# Patient Record
Sex: Female | Born: 1950 | Race: White | Hispanic: No | Marital: Married | Smoking: Current every day smoker
Health system: Southern US, Community
[De-identification: ages and names within clinical notes are randomized; demographics above are authoritative.]

## PROBLEM LIST (undated history)

## (undated) DIAGNOSIS — F329 Major depressive disorder, single episode, unspecified: Secondary | ICD-10-CM

## (undated) DIAGNOSIS — F32A Depression, unspecified: Secondary | ICD-10-CM

## (undated) DIAGNOSIS — K922 Gastrointestinal hemorrhage, unspecified: Secondary | ICD-10-CM

## (undated) DIAGNOSIS — F419 Anxiety disorder, unspecified: Secondary | ICD-10-CM

## (undated) DIAGNOSIS — K5732 Diverticulitis of large intestine without perforation or abscess without bleeding: Secondary | ICD-10-CM

## (undated) DIAGNOSIS — K7460 Unspecified cirrhosis of liver: Secondary | ICD-10-CM

## (undated) DIAGNOSIS — R0602 Shortness of breath: Secondary | ICD-10-CM

## (undated) DIAGNOSIS — G9340 Encephalopathy, unspecified: Secondary | ICD-10-CM

## (undated) DIAGNOSIS — E039 Hypothyroidism, unspecified: Secondary | ICD-10-CM

## (undated) DIAGNOSIS — Z720 Tobacco use: Secondary | ICD-10-CM

## (undated) DIAGNOSIS — E669 Obesity, unspecified: Secondary | ICD-10-CM

## (undated) DIAGNOSIS — D369 Benign neoplasm, unspecified site: Secondary | ICD-10-CM

## (undated) DIAGNOSIS — I2510 Atherosclerotic heart disease of native coronary artery without angina pectoris: Secondary | ICD-10-CM

## (undated) DIAGNOSIS — E119 Type 2 diabetes mellitus without complications: Secondary | ICD-10-CM

## (undated) DIAGNOSIS — E785 Hyperlipidemia, unspecified: Secondary | ICD-10-CM

## (undated) DIAGNOSIS — E079 Disorder of thyroid, unspecified: Secondary | ICD-10-CM

## (undated) DIAGNOSIS — I499 Cardiac arrhythmia, unspecified: Secondary | ICD-10-CM

## (undated) DIAGNOSIS — I350 Nonrheumatic aortic (valve) stenosis: Secondary | ICD-10-CM

## (undated) DIAGNOSIS — K760 Fatty (change of) liver, not elsewhere classified: Secondary | ICD-10-CM

## (undated) DIAGNOSIS — I8500 Esophageal varices without bleeding: Secondary | ICD-10-CM

## (undated) DIAGNOSIS — K3189 Other diseases of stomach and duodenum: Secondary | ICD-10-CM

## (undated) DIAGNOSIS — D696 Thrombocytopenia, unspecified: Secondary | ICD-10-CM

## (undated) DIAGNOSIS — E559 Vitamin D deficiency, unspecified: Secondary | ICD-10-CM

## (undated) DIAGNOSIS — Z952 Presence of prosthetic heart valve: Secondary | ICD-10-CM

## (undated) DIAGNOSIS — I10 Essential (primary) hypertension: Secondary | ICD-10-CM

## (undated) DIAGNOSIS — K589 Irritable bowel syndrome without diarrhea: Secondary | ICD-10-CM

## (undated) DIAGNOSIS — F0390 Unspecified dementia without behavioral disturbance: Secondary | ICD-10-CM

## (undated) DIAGNOSIS — R911 Solitary pulmonary nodule: Secondary | ICD-10-CM

## (undated) DIAGNOSIS — R161 Splenomegaly, not elsewhere classified: Secondary | ICD-10-CM

## (undated) DIAGNOSIS — E538 Deficiency of other specified B group vitamins: Secondary | ICD-10-CM

## (undated) DIAGNOSIS — K7581 Nonalcoholic steatohepatitis (NASH): Secondary | ICD-10-CM

## (undated) DIAGNOSIS — K766 Portal hypertension: Secondary | ICD-10-CM

## (undated) HISTORY — DX: Vitamin D deficiency, unspecified: E55.9

## (undated) HISTORY — DX: Nonrheumatic aortic (valve) stenosis: I35.0

## (undated) HISTORY — DX: Gastrointestinal hemorrhage, unspecified: K92.2

## (undated) HISTORY — DX: Atherosclerotic heart disease of native coronary artery without angina pectoris: I25.10

## (undated) HISTORY — DX: Esophageal varices without bleeding: I85.00

## (undated) HISTORY — PX: OTHER SURGICAL HISTORY: SHX169

## (undated) HISTORY — PX: CORONARY ANGIOPLASTY: SHX604

## (undated) HISTORY — DX: Hyperlipidemia, unspecified: E78.5

## (undated) HISTORY — DX: Deficiency of other specified B group vitamins: E53.8

## (undated) HISTORY — DX: Shortness of breath: R06.02

## (undated) HISTORY — DX: Other diseases of stomach and duodenum: K31.89

## (undated) HISTORY — DX: Thrombocytopenia, unspecified: D69.6

## (undated) HISTORY — DX: Nonalcoholic steatohepatitis (NASH): K75.81

## (undated) HISTORY — PX: CARDIAC CATHETERIZATION: SHX172

## (undated) HISTORY — DX: Anxiety disorder, unspecified: F41.9

## (undated) HISTORY — DX: Disorder of thyroid, unspecified: E07.9

## (undated) HISTORY — DX: Type 2 diabetes mellitus without complications: E11.9

## (undated) HISTORY — DX: Benign neoplasm, unspecified site: D36.9

## (undated) HISTORY — PX: OVARIAN CYST REMOVAL: SHX89

## (undated) HISTORY — DX: Fatty (change of) liver, not elsewhere classified: K76.0

## (undated) HISTORY — DX: Unspecified dementia without behavioral disturbance: F03.90

## (undated) HISTORY — DX: Cardiac arrhythmia, unspecified: I49.9

## (undated) HISTORY — DX: Essential (primary) hypertension: I10

## (undated) HISTORY — DX: Solitary pulmonary nodule: R91.1

## (undated) HISTORY — DX: Obesity, unspecified: E66.9

## (undated) HISTORY — DX: Unspecified cirrhosis of liver: K74.60

## (undated) HISTORY — DX: Diverticulitis of large intestine without perforation or abscess without bleeding: K57.32

## (undated) HISTORY — DX: Portal hypertension: K76.6

## (undated) HISTORY — DX: Splenomegaly, not elsewhere classified: R16.1

## (undated) HISTORY — DX: Encephalopathy, unspecified: G93.40

## (undated) HISTORY — DX: Irritable bowel syndrome without diarrhea: K58.9

---
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
## 1998-02-02 ENCOUNTER — Ambulatory Visit (HOSPITAL_COMMUNITY): Admission: RE | Admit: 1998-02-02 | Discharge: 1998-02-02 | Payer: Self-pay | Admitting: Obstetrics and Gynecology

## 1998-08-20 ENCOUNTER — Emergency Department (HOSPITAL_COMMUNITY): Admission: EM | Admit: 1998-08-20 | Discharge: 1998-08-21 | Payer: Self-pay | Admitting: Endocrinology

## 1999-05-14 ENCOUNTER — Encounter: Admission: RE | Admit: 1999-05-14 | Discharge: 1999-05-14 | Payer: Self-pay | Admitting: Obstetrics and Gynecology

## 2006-11-10 ENCOUNTER — Encounter: Payer: Self-pay | Admitting: Gastroenterology

## 2006-11-13 ENCOUNTER — Encounter: Payer: Self-pay | Admitting: Gastroenterology

## 2008-04-06 ENCOUNTER — Ambulatory Visit: Payer: Self-pay | Admitting: Gastroenterology

## 2008-04-06 DIAGNOSIS — K589 Irritable bowel syndrome without diarrhea: Secondary | ICD-10-CM

## 2008-04-06 HISTORY — DX: Irritable bowel syndrome, unspecified: K58.9

## 2008-04-08 ENCOUNTER — Encounter: Payer: Self-pay | Admitting: Gastroenterology

## 2008-04-08 LAB — CONVERTED CEMR LAB
Calcium: 9.1 mg/dL (ref 8.4–10.5)
Chloride: 111 meq/L (ref 96–112)
Creatinine, Ser: 0.7 mg/dL (ref 0.4–1.2)
Eosinophils Absolute: 0.3 10*3/uL (ref 0.0–0.7)
Eosinophils Relative: 2.5 % (ref 0.0–5.0)
GFR calc non Af Amer: 91 mL/min
Glucose, Bld: 185 mg/dL — ABNORMAL HIGH (ref 70–99)
HCT: 40 % (ref 36.0–46.0)
Hemoglobin: 14.2 g/dL (ref 12.0–15.0)
MCV: 85.4 fL (ref 78.0–100.0)
Monocytes Absolute: 1 10*3/uL (ref 0.1–1.0)
Platelets: 177 10*3/uL (ref 150–400)
RDW: 12.8 % (ref 11.5–14.6)
Sodium: 141 meq/L (ref 135–145)
WBC: 10.2 10*3/uL (ref 4.5–10.5)

## 2008-04-25 ENCOUNTER — Telehealth: Payer: Self-pay | Admitting: Gastroenterology

## 2008-04-28 ENCOUNTER — Ambulatory Visit: Payer: Self-pay | Admitting: Gastroenterology

## 2008-04-28 ENCOUNTER — Ambulatory Visit (HOSPITAL_COMMUNITY): Admission: RE | Admit: 2008-04-28 | Discharge: 2008-04-28 | Payer: Self-pay | Admitting: Gastroenterology

## 2008-04-28 ENCOUNTER — Encounter: Payer: Self-pay | Admitting: Gastroenterology

## 2008-04-30 ENCOUNTER — Encounter: Payer: Self-pay | Admitting: Gastroenterology

## 2008-05-05 ENCOUNTER — Telehealth: Payer: Self-pay | Admitting: Gastroenterology

## 2008-06-14 ENCOUNTER — Telehealth: Payer: Self-pay | Admitting: Gastroenterology

## 2008-06-14 ENCOUNTER — Encounter: Payer: Self-pay | Admitting: Gastroenterology

## 2008-06-14 DIAGNOSIS — K5732 Diverticulitis of large intestine without perforation or abscess without bleeding: Secondary | ICD-10-CM

## 2008-06-14 HISTORY — DX: Diverticulitis of large intestine without perforation or abscess without bleeding: K57.32

## 2008-06-16 ENCOUNTER — Ambulatory Visit: Payer: Self-pay | Admitting: Cardiology

## 2008-06-23 ENCOUNTER — Ambulatory Visit: Payer: Self-pay | Admitting: Gynecology

## 2008-06-27 ENCOUNTER — Ambulatory Visit: Payer: Self-pay | Admitting: Gynecology

## 2008-07-19 ENCOUNTER — Ambulatory Visit: Payer: Self-pay | Admitting: Gynecology

## 2008-07-22 ENCOUNTER — Encounter: Payer: Self-pay | Admitting: Gynecology

## 2008-07-22 ENCOUNTER — Ambulatory Visit: Payer: Self-pay | Admitting: Gynecology

## 2008-07-22 ENCOUNTER — Ambulatory Visit (HOSPITAL_COMMUNITY): Admission: RE | Admit: 2008-07-22 | Discharge: 2008-07-22 | Payer: Self-pay | Admitting: Gynecology

## 2008-08-01 ENCOUNTER — Ambulatory Visit: Payer: Self-pay | Admitting: Gynecology

## 2009-01-31 ENCOUNTER — Telehealth: Payer: Self-pay | Admitting: Gastroenterology

## 2009-02-03 ENCOUNTER — Ambulatory Visit: Payer: Self-pay | Admitting: Gynecology

## 2009-02-03 ENCOUNTER — Encounter: Payer: Self-pay | Admitting: Gastroenterology

## 2009-02-20 ENCOUNTER — Telehealth (INDEPENDENT_AMBULATORY_CARE_PROVIDER_SITE_OTHER): Payer: Self-pay | Admitting: *Deleted

## 2009-02-28 ENCOUNTER — Ambulatory Visit: Payer: Self-pay | Admitting: Gastroenterology

## 2009-02-28 DIAGNOSIS — R740 Nonspecific elevation of levels of transaminase and lactic acid dehydrogenase [LDH]: Secondary | ICD-10-CM

## 2009-02-28 LAB — CONVERTED CEMR LAB
A-1 Antitrypsin, Ser: 195 mg/dL (ref 83–200)
Ceruloplasmin: 43 mg/dL (ref 21–63)
HCV Ab: NEGATIVE
Tissue Transglutaminase Ab, IgA: 0.5 units (ref <7)

## 2009-03-01 LAB — CONVERTED CEMR LAB
ALT: 70 units/L — ABNORMAL HIGH (ref 0–35)
AST: 56 units/L — ABNORMAL HIGH (ref 0–37)
Albumin: 3.8 g/dL (ref 3.5–5.2)
Calcium: 9.2 mg/dL (ref 8.4–10.5)
Creatinine, Ser: 0.7 mg/dL (ref 0.4–1.2)
Glucose, Bld: 284 mg/dL — ABNORMAL HIGH (ref 70–99)
HCT: 45.9 % (ref 36.0–46.0)
Iron: 67 ug/dL (ref 42–145)
Lymphs Abs: 4.9 10*3/uL — ABNORMAL HIGH (ref 0.7–4.0)
MCHC: 34.3 g/dL (ref 30.0–36.0)
MCV: 86 fL (ref 78.0–100.0)
Monocytes Relative: 6.4 % (ref 3.0–12.0)
Neutrophils Relative %: 53.3 % (ref 43.0–77.0)
RDW: 13.1 % (ref 11.5–14.6)
Sodium: 138 meq/L (ref 135–145)
Total Bilirubin: 0.7 mg/dL (ref 0.3–1.2)
Total Protein: 7.4 g/dL (ref 6.0–8.3)
Transferrin: 231 mg/dL (ref 212.0–360.0)
WBC: 13.2 10*3/uL — ABNORMAL HIGH (ref 4.5–10.5)

## 2009-03-03 ENCOUNTER — Ambulatory Visit (HOSPITAL_COMMUNITY): Admission: RE | Admit: 2009-03-03 | Discharge: 2009-03-03 | Payer: Self-pay | Admitting: Gastroenterology

## 2009-05-05 ENCOUNTER — Ambulatory Visit: Payer: Self-pay | Admitting: Gastroenterology

## 2009-11-03 ENCOUNTER — Telehealth (INDEPENDENT_AMBULATORY_CARE_PROVIDER_SITE_OTHER): Payer: Self-pay | Admitting: *Deleted

## 2010-09-04 NOTE — Progress Notes (Signed)
Summary: Lab reminder  Phone Note Outgoing Call Call back at Home Phone [phone]   Call placed by: Christian Mate CMA [name]),  November 03, 2009 1:29 PM Summary of Call: pt reminded to have labs Initial call taken by: Christian Mate CMA [name]),  November 03, 2009 1:30 PM

## 2010-12-18 NOTE — H&P (Signed)
NAME:  Kelly [name], Kelly [name]                 ACCOUNT NO.:  [id number]   MEDICAL RECORD NO.:  [id number]          PATIENT TYPE:  AMB   LOCATION:  [place]                           FACILITY:  [facility]   PHYSICIAN:  Timothy P. Fontaine, M.D.DATE OF BIRTH:  [birth year]   DATE OF ADMISSION:  DATE OF DISCHARGE:                              HISTORY & PHYSICAL   CHIEF COMPLAINT:  Pelvic discomfort, ovarian or tubular cyst on the  right.   HISTORY OF PRESENT ILLNESS:  A 60 year old G75, P62 female, postmenopausal  presented complaining of some pelvic pain.  Ultimately, underwent  evaluation to include CTs and ultrasounds, which showed a thin-walled  avascular cyst in the right adnexa measuring 70 x 60 mm somewhat tubular  in shape.  This has persisted over 2 months.  Her pain has also  persisted and she is admitted for laparoscopic evaluation and removal.  She did have a CA-125, which was negative.  No evidence of ascites or  cul-de-sac fluid and no evidence of adenopathy on CT scanning.   PAST MEDICAL HISTORY:  Significant for borderline diabetics, IBS,  diverticulitis, anxiety disorder, hypothyroidism, and hypertension.   PAST SURGICAL HISTORY:  Laparoscopic ovarian cystectomy in 1999.   CURRENT MEDICATIONS:  1. Armour Thyroid.  2. Wellbutrin.  3. Metaprel.  4. Diovan.  5. Ambien p.r.n.  See medical reconciliation sheet for dosages.   MEDICAL ALLERGIES:  None.   REVIEW OF SYSTEMS:  Noncontributory.   FAMILY HISTORY:  Noncontributory.   SOCIAL HISTORY:  Noncontributory.   ADMISSION PHYSICAL EXAMINATION:  GENERAL:  Afebrile.  VITAL SIGNS:  Stable.  HEENT:  Normal.  LUNGS:  Clear.  CARDIAC:  Regular rate.  No rubs, murmurs, or gallops.  ABDOMINAL:  Benign.  PELVIC:  External BUS, vagina normal.  Cervix grossly normal, atrophic.  Bimanual without gross masses or tenderness.   ASSESSMENT:  A 60 year old G1, P82 female, postmenopausal, lower  abdominal discomfort, ultimately underwent evaluation  showing a tubular  avascular simple appearing cyst in the right adnexa, suspicious for  hydrosalpinx, possible ovarian, negative CA-125, no ascites cul-de-sac  fluids, no adenopathy on CT scan, for laparoscopic evaluation and  removal.  I reviewed the surgery with the patient, inspected  intraoperative and postoperative courses.  She understands clearly that  I cannot guarantee this is not malignancy and indeed, it [place] be the  ovarian or non-ovarian malignancy, if so discovered, then at this point  we would terminate the procedure.  She would be referred to a  gynecologic oncologist.  She also understands realistic risks of  rupturing of this structure upon removal and the issue of rupturing if  indeed it turns out to be a malignancy, was discussed whether this would  change her prognosis or not.  She also understands that there is  significant adhesive disease obscuring this adnexa that would make it  difficult to free up this adnexa for fear of damage to surrounding  structures and we Kruzel abort the laparoscopic approach and then return at  a future date for more definitive exploratory surgery and she  understands and accepts this.  She will have a bowel prep and antibiotic  prophylaxis prior to the surgery.  Which involved with laparoscopic  surgery to include multiple port sites, insufflation, trocar placement,  sharp blunt dissection, electrocautery, harmonic scalpel, all reviewed  with her.  She understands that if it is tubular, we Chisum remove her  tube; if ovarian, her ovary and tube and I will have to make this  decision at the time of surgery.  The acute intraoperative risks were  discussed to include infection requiring prolonged antibiotics, abscess  formation requiring reoperation, and abscess drainage, incisional  complications requiring opening and draining of incisions, closure by  secondary intention, long-term issues to include hernia formation, scar  formation, keloid,  and cosmetics.  Risk of hemorrhage necessitating  transfusion, discussed to include transfusion, reaction, hepatitis, HIV,  mad cow disease and other unknown entities.  Risk of inadvertent injury  to internal organs including bowel, bladder, ureters, vessels, and  nerves necessitating major exploratory reparative surgeries, future  reparative surgeries, ostomy formation, bowel resection, bladder repair,  ureteral damage, repair was all discussed, understood, and accepted.  The patient's questions were answered to her satisfaction and she is  ready to proceed with surgery.      Timothy P. Fontaine, M.D.  Electronically Signed     TPF/MEDQ  D:  07/19/2008  T:  07/20/2008  Job:  [id number]

## 2010-12-18 NOTE — Op Note (Signed)
NAME:  Kelly [name], Kelly [name]                 ACCOUNT NO.:  [id number]   MEDICAL RECORD NO.:  [id number]          PATIENT TYPE:  AMB   LOCATION:  [place]                           FACILITY:  [facility]   PHYSICIAN:  Timothy P. Fontaine, M.D.DATE OF BIRTH:  1950/12/10   DATE OF PROCEDURE:  07/22/2008  DATE OF DISCHARGE:                               OPERATIVE REPORT   PREOPERATIVE DIAGNOSES:  Pelvic discomfort and cystic right adnexal  mass.   POSTOPERATIVE DIAGNOSIS:  Torsed right hydrosalpinx.   PROCEDURE:  Laparoscopic right salpingectomy.   SURGEON:  Timothy P. Fontaine, MD   ASSISTANT:  Starlyn Skeans. Toney Rakes, MD   ANESTHETIC:  General with 0.25% Marcaine incisional injection.   ESTIMATED BLOOD LOSS:  Minimal.   COMPLICATIONS:  None.   PATHOLOGY:  1. Opening cell washing.  2. Hydro fluid.  3. Distal right hydrosalpinx.   FINDINGS:  EUA, external BUS, vagina grossly normal, cervix grossly  normal.  Bimanual, uterus grossly normal although limited by abdominal  girth.  Adnexa without gross masses.  Laparoscopic anterior cul-de-sac  normal.  Posterior cul-de-sac normal.  Uterus normal in size, shape, and  contour.  Left ovary grossly normal.  Left fallopian tube not well  visualized, but grossly normal on the distal inspection.  Right ovary  grossly normal.  Right fallopian tube with grossly dilated hydrosalpinx  at the terminal portion and a cystic mass with gross evidence of torsion  with multiple twists of the fallopian tube.  No evidence of pelvic  adhesive disease or endometriosis.  Upper abdominal exam shows appendix  grossly normal, free and mobile.  Liver grossly normal.  Gallbladder  normal to limited inspection.   PROCEDURE IN DETAIL:  The patient was taken to the operating room,  underwent general anesthesia, placed in the low dorsal lithotomy  position, received an abdominal perineal vaginal preparation with  Betadine solution.  EUA performed.  Hulka tenaculum was placed on the  cervix.  Bladder emptied with an indwelling Foley catheterization.  The  patient was draped in a usual fashion.  A repeat transverse  infraumbilical incision was made using the 10-mm Optiview trocar.  The  abdomen was sharply entered under direct visualization without  difficulty and subsequently insufflated.  Right and left 5-mm suprapubic  ports were then placed under direct visualization after  transillumination for the vessels without difficulty.  Examination of  pelvic organs and upper abdominal exam was carried out with findings  noted above.  The right distal hydro was grasped.  The torsed portion  exposed and using the gyrus bipolar cautery, this section was bipolar  cauterized and transected to free the distal end of the hydro.  Switching to a 5-mm laparoscope through a suprapubic port, the Endopouch  was placed through the infraumbilical incision and the cystic mass was  placed within the bag.  Of note, an opening cell washing was taken prior  to initiating removal of the hydro.  At this point, due to the size of  the lesion, a 5-mm laparoscopic needle aspirator was placed through the  5-mm port.  The cystic mass punctured  and the mass decompressed of  approximately 30-40 mL of clear fluid all within the Endopouch, never  spilling or leaking into the peritoneal cavity.  This was removed and  sent for cytology and subsequently this allowed for the Endopouch to be  removed from the infraumbilical port site without difficulty.  The  specimen was sent to Pathology.  At this point, the abdomen was re-  insufflated.  Reexamination showed hemostasis from the operative site.  The right and left 5-mm ports were removed.  The gas allowed to escape.  Hemostasis maintained under a low-pressure situation and the  infraumbilical port was backed out under direct visualization showing  adequate hemostasis.  No evidence of hernia formation.  The fascial  edges at the infraumbilical port site were  grasped with Kocher clamps,  elevated, and a using 0 Vicryl suture, the fascia was reapproximated in  a running stitch.  The subcutaneous tissues were reapproximated with  interrupted 0 Vicryl stitch and subsequently all skin incision sites  were injected using 0.25% Marcaine.  The skin was then closed using 3-0  chromic in interrupted cutaneous stitch.  The Hulka tenaculum was  removed.  Sterile dressings were applied to the surgical sites.  The  patient was placed in the supine position, awakened without difficulty,  and taken to recovery room in good condition having tolerated the  procedure well.      Timothy P. Fontaine, M.D.  Electronically Signed     TPF/MEDQ  D:  07/22/2008  T:  07/23/2008  Job:  [id number]

## 2011-05-10 LAB — CBC
HCT: 39.7 % (ref 36.0–46.0)
Hemoglobin: 13.7 g/dL (ref 12.0–15.0)
MCV: 86.8 fL (ref 78.0–100.0)
RDW: 14.4 % (ref 11.5–15.5)
WBC: 11.1 10*3/uL — ABNORMAL HIGH (ref 4.0–10.5)

## 2011-05-10 LAB — COMPREHENSIVE METABOLIC PANEL
Alkaline Phosphatase: 87 U/L (ref 39–117)
BUN: 7 mg/dL (ref 6–23)
Chloride: 105 mEq/L (ref 96–112)
GFR calc non Af Amer: >60 mL/min (ref >60)
Glucose, Bld: 107 mg/dL — ABNORMAL HIGH (ref 70–99)
Potassium: 3.8 mEq/L (ref 3.5–5.1)
Total Bilirubin: 0.4 mg/dL (ref 0.3–1.2)

## 2012-10-21 ENCOUNTER — Telehealth: Payer: Self-pay | Admitting: Gastroenterology

## 2012-10-21 NOTE — Telephone Encounter (Signed)
Line rings busy

## 2012-10-22 NOTE — Telephone Encounter (Signed)
Small amount of BRB on tissue, occasional bloating and lower abd discomfort.  Pt was offered several appt's for the first week of April but she declined due to her husbands appt's she decided on 11/24/12.

## 2012-11-03 ENCOUNTER — Other Ambulatory Visit (INDEPENDENT_AMBULATORY_CARE_PROVIDER_SITE_OTHER): Payer: BC Managed Care – PPO

## 2012-11-03 ENCOUNTER — Encounter: Payer: Self-pay | Admitting: Gastroenterology

## 2012-11-03 ENCOUNTER — Ambulatory Visit (INDEPENDENT_AMBULATORY_CARE_PROVIDER_SITE_OTHER): Payer: BC Managed Care – PPO | Admitting: Gastroenterology

## 2012-11-03 VITALS — BP 120/60 | HR 68 | Ht 64.0 in | Wt 216.0 lb

## 2012-11-03 DIAGNOSIS — R141 Gas pain: Secondary | ICD-10-CM

## 2012-11-03 DIAGNOSIS — R140 Abdominal distension (gaseous): Secondary | ICD-10-CM

## 2012-11-03 DIAGNOSIS — R142 Eructation: Secondary | ICD-10-CM

## 2012-11-03 DIAGNOSIS — K625 Hemorrhage of anus and rectum: Secondary | ICD-10-CM

## 2012-11-03 DIAGNOSIS — R109 Unspecified abdominal pain: Secondary | ICD-10-CM

## 2012-11-03 DIAGNOSIS — R197 Diarrhea, unspecified: Secondary | ICD-10-CM

## 2012-11-03 LAB — COMPREHENSIVE METABOLIC PANEL
ALT: 24 U/L (ref 0–35)
CO2: 24 mEq/L (ref 19–32)
Calcium: 9.5 mg/dL (ref 8.4–10.5)
Chloride: 105 mEq/L (ref 96–112)
GFR: 79.01 mL/min (ref >60.00)
Sodium: 139 mEq/L (ref 135–145)
Total Protein: 7.6 g/dL (ref 6.0–8.3)

## 2012-11-03 MED ORDER — MOVIPREP 100 G PO SOLR: 1.0000 | Freq: Once | ORAL | Status: DC

## 2012-11-03 NOTE — Progress Notes (Signed)
Review of gastrointestinal problems:  1. small tubular adenoma in colon, removed September, 2009. Next colonoscopy September 2014  2. left-sided diverticulosis with diverticular associated edema noted by colonoscopy 2009  3. abnormal liver tests noted July, 2010: Likely from NASH Blood work August, 2010: Hepatitis A., B., C. were all negative; alpha one antitrypsin level normal, ceruloplasmin level normal, AMA negative, AMA negative, iron studies normal, anti-smooth muscle antibody negative, AST and ALT in 50-70 range; TTG negative, IgA level normal. Liver ultrasound suggested fatty liver disease.   HPI: This is a   very pleasant 62 year old woman whom I last saw about 4 years ago.  Minor reddish blood on a single occasion 2 weeks ago.  Since January has had a lot of GI upset (her husband had AMI one week prior).  She has been "really really really really depressed."  Started zoloft  About a month ago and cut back.  She has been having lower abdominal pains. HAs had these pains in past, eventually was determined to be from ovarian cyst and that was eventually resected as well as the appy.  Currently her pains are in very low abdomen, worse on right. They were intermittent.  She cannot characterize the pain. Usually eating will bloating.  Having BM releives the pain for brief time.  Her normal BM regimine is usually twice per day.  She has had alternating constipation, diarrhea.    SHe just wants to lay down, resting.    Review of systems: Pertinent positive and negative review of systems were noted in the above HPI section. Complete review of systems was performed and was otherwise normal.    Past Medical History  Diagnosis Date  . Adenomatous polyp   . Diverticulosis   . NASH (nonalcoholic steatohepatitis)   . Hypertension   . Thyroid disease   . Diabetes   . Anxiety   . Arrhythmia     Past Surgical History  Procedure Laterality Date  . Oopherectomy      Current  Outpatient Prescriptions  Medication Sig Dispense Refill  . glyBURIDE (DIABETA) 2.5 MG tablet Take 2.5 mg by mouth 3 (three) times daily. 2 every morning and 1 every night      . LORazepam (ATIVAN) 0.5 MG tablet Take 0.5 mg by mouth every 8 (eight) hours.      Marland Kitchen losartan (COZAAR) 100 MG tablet Take 100 mg by mouth daily.      . metFORMIN (GLUMETZA) 500 MG (MOD) 24 hr tablet Take 500 mg by mouth 3 (three) times daily. 2 every morning and 1 every night      . metoprolol succinate (TOPROL-XL) 25 MG 24 hr tablet Take 25 mg by mouth 2 (two) times daily.      . sertraline (ZOLOFT) 50 MG tablet Take 50 mg by mouth daily.      Marland Kitchen zolpidem (AMBIEN) 5 MG tablet Take 5 mg by mouth at bedtime as needed for sleep.       No current facility-administered medications for this visit.    Allergies as of 11/03/2012 - Review Complete 11/03/2012  Allergen Reaction Noted  . Celecoxib      Family History  Problem Relation Age of Onset  . Lung cancer Father   . Heart disease Father   . Alzheimer's disease Mother     History   Social History  . Marital Status: Married    Spouse Name: N/A    Number of Children: N/A  . Years of Education: N/A   Occupational  History  . Not on file.   Social History Main Topics  . Smoking status: Current Every Day Smoker  . Smokeless tobacco: Not on file  . Alcohol Use: No  . Drug Use: No  . Sexually Active: Not on file   Other Topics Concern  . Not on file   Social History Narrative  . No narrative on file       Physical Exam: BP 120/60  Pulse 68  Ht 5' 4"  (1.626 m)  Wt 216 lb (97.977 kg)  BMI 37.06 kg/m2 Constitutional: generally well-appearing Psychiatric: alert and oriented x3 Eyes: extraocular movements intact Mouth: oral pharynx moist, no lesions Neck: supple no lymphadenopathy Cardiovascular: heart regular rate and rhythm Lungs: clear to auscultation bilaterally Abdomen: soft, nontender, nondistended, no obvious ascites, no peritoneal  signs, normal bowel sounds Extremities: no lower extremity edema bilaterally Skin: no lesions on visible extremities    Assessment and plan: 62 y.o. female with  changes in bowels, personal history of adenomatous polyp  She admits to being very depressed recently since her husband's illness and I suspected that could be causing some of her GI changes. She has however seen some blood per rectum and has a personal history of adenomatous polyps. Her last colonoscopy was about 4 1/2 years ago and she is due for surveillance colonoscopy in the next few months and we will schedule that for her now. She will also get complete metabolic profile since she has had abnormal liver tests in the past.

## 2012-11-03 NOTE — Patient Instructions (Addendum)
You will have labs checked today in the basement lab.  Please head down after you check out with the front desk (cmet) You will be set up for a colonoscopy (moderate sedation, LEC) for change in bowel, minor bleeding. One of your biggest health concerns is your smoking.  This increases your risk for most cancers and serious cardiovascular diseases such as strokes, heart attacks.  You should try your best to stop.  If you need assistance, please contact your PCP or Smoking Cessation Class at [facility] [phone]) or [place] (1-800-QUIT-NOW). Try OTC imodium for your intermittent diarrhea.                                               We are excited to introduce MyChart, a new best-in-class service that provides you online access to important information in your electronic medical record. We want to make it easier for you to view your health information - all in one secure location - when and where you need it. We expect MyChart will enhance the quality of care and service we provide.  When you register for MyChart, you can:    View your test results.    Request appointments and receive appointment reminders via email.    Request medication renewals.    View your medical history, allergies, medications and immunizations.    Communicate with your physician's office through a password-protected site.    Conveniently print information such as your medication lists.  To find out if MyChart is right for you, please talk to a member of our clinical staff today. We will gladly answer your questions about this free health and wellness tool.  If you are age 64 or older and want a member of your family to have access to your record, you must provide written consent by completing a proxy form available at our office. Please speak to our clinical staff about guidelines regarding accounts for patients younger than age 32.  As you activate your MyChart account and need any technical  assistance, please call the MyChart technical support line at [phone]) or email your question to [email or web address]. If you email your question(s), please include your name, a return phone number and the best time to reach you.  If you have non-urgent health-related questions, you can send a message to our office through [place] at [email or web address]. If you have a medical emergency, call 911.  Thank you for using MyChart as your new health and wellness resource!   MyChart licensed from [employer],  1999-2010. Patents Pending.

## 2012-11-24 ENCOUNTER — Ambulatory Visit: Payer: Self-pay | Admitting: Gastroenterology

## 2012-11-26 ENCOUNTER — Telehealth: Payer: Self-pay | Admitting: Gastroenterology

## 2012-11-26 NOTE — Telephone Encounter (Signed)
Pt advised to follow Dr [name] recommendations from her recent office visit and she will be put on a wait list for her procedure.  Pt agreed and will call with further questions or concerns

## 2012-12-09 ENCOUNTER — Ambulatory Visit (AMBULATORY_SURGERY_CENTER): Payer: BC Managed Care – PPO | Admitting: Gastroenterology

## 2012-12-09 ENCOUNTER — Encounter: Payer: Self-pay | Admitting: Gastroenterology

## 2012-12-09 ENCOUNTER — Other Ambulatory Visit: Payer: Self-pay | Admitting: Gastroenterology

## 2012-12-09 VITALS — BP 109/62 | HR 53 | Temp 97.4°F | Resp 20 | Ht 64.0 in | Wt 216.0 lb

## 2012-12-09 DIAGNOSIS — R140 Abdominal distension (gaseous): Secondary | ICD-10-CM

## 2012-12-09 DIAGNOSIS — R197 Diarrhea, unspecified: Secondary | ICD-10-CM

## 2012-12-09 DIAGNOSIS — R109 Unspecified abdominal pain: Secondary | ICD-10-CM

## 2012-12-09 DIAGNOSIS — K625 Hemorrhage of anus and rectum: Secondary | ICD-10-CM

## 2012-12-09 DIAGNOSIS — K5289 Other specified noninfective gastroenteritis and colitis: Secondary | ICD-10-CM

## 2012-12-09 MED ORDER — SODIUM CHLORIDE 0.9 % IV SOLN: 500.0000 mL | INTRAVENOUS | Status: DC

## 2012-12-09 NOTE — Op Note (Signed)
[facility]  [address] [place], [address]   COLONOSCOPY PROCEDURE REPORT  PATIENT: Kelly [name], Kelly [name]  MR#: [id number] BIRTHDATE: [birth year] , 62  yrs. old GENDER: Female ENDOSCOPIST: [name], MD PROCEDURE DATE:  12/09/2012 PROCEDURE:   Colonoscopy with biopsy ASA CLASS:   Class II INDICATIONS:single small TA removed Sept 2009, recent change in bowels (diarrhea) and intermittent minor rectal bleeding. MEDICATIONS: Fentanyl 50 mcg IV, Versed 6 mg IV, and These medications were titrated to patient response per physician's verbal order  DESCRIPTION OF PROCEDURE:   After the risks benefits and alternatives of the procedure were thoroughly explained, informed consent was obtained.  A digital rectal exam revealed no abnormalities of the rectum.   The LB PCF-H180AL [id number] and LB CF-H180AL [id number]  endoscope was introduced through the anus and advanced to the terminal ileum which was intubated for a short distance. No adverse events experienced.   The quality of the prep was good.  The instrument was then slowly withdrawn as the colon was fully examined.   COLON FINDINGS: The colonic mucosa was slightly erythematous throughout.  Biopsies were taken randomly and sent to pathology. There were multiple diverticulum in the left colon.  The terminal ileum was normal.  No polyps or cancers.  Retroflexed views revealed no abnormalities. The time to cecum=2 minutes 49 seconds. Withdrawal time=5 minutes 37 seconds.  The scope was withdrawn and the procedure completed. COMPLICATIONS: There were no complications.  ENDOSCOPIC IMPRESSION: The colonic mucosa was slightly erythematous throughout.  Biopsies were taken randomly and sent to pathology.  There were multiple diverticulum in the left colon.  The terminal ileum was normal.  No polyps or cancers.  RECOMMENDATIONS: 1.  Await final pathology. 2.  You should continue to follow colorectal cancer  screening guidelines for "routine risk" patients with a repeat colonoscopy in 10 years.  There is no need for FOBT (stool) testing for at least 5 years.   eSigned:  [name], MD 12/09/2012 11:30 AM   cc: Deland Pretty, MD

## 2012-12-09 NOTE — Progress Notes (Signed)
Patient did not experience any of the following events: a burn prior to discharge; a fall within the facility; wrong site/side/patient/procedure/implant event; or a hospital transfer or hospital admission upon discharge from the facility. (G8907) Patient did not have preoperative order for IV antibiotic SSI prophylaxis. (G8918)  

## 2012-12-09 NOTE — Patient Instructions (Addendum)
One of your biggest health concerns is your smoking.  This increases your risk for most cancers and serious cardiovascular diseases such as strokes, heart attacks.  You should try your best to stop.  If you need assistance, please contact your PCP or Smoking Cessation Class at [facility] [phone]) or [place] (1-800-QUIT-NOW).   YOU HAD AN ENDOSCOPIC PROCEDURE TODAY AT [facility]: Refer to the procedure report that was given to you for any specific questions about what was found during the examination.  If the procedure report does not answer your questions, please call your gastroenterologist to clarify.  If you requested that your care partner not be given the details of your procedure findings, then the procedure report has been included in a sealed envelope for you to review at your convenience later.  YOU SHOULD EXPECT: Some feelings of bloating in the abdomen. Passage of more gas than usual.  Walking can help get rid of the air that was put into your GI tract during the procedure and reduce the bloating. If you had a lower endoscopy (such as a colonoscopy or flexible sigmoidoscopy) you Mealey notice spotting of blood in your stool or on the toilet paper. If you underwent a bowel prep for your procedure, then you [place] not have a normal bowel movement for a few days.  DIET: Your first meal following the procedure should be a light meal and then it is ok to progress to your normal diet.  A half-sandwich or bowl of soup is an example of a good first meal.  Heavy or fried foods are harder to digest and [place] make you feel nauseous or bloated.  Likewise meals heavy in dairy and vegetables can cause extra gas to form and this can also increase the bloating.  Drink plenty of fluids but you should avoid alcoholic beverages for 24 hours.  ACTIVITY: Your care partner should take you home directly after the procedure.  You should plan to take it easy, moving slowly for the rest of  the day.  You can resume normal activity the day after the procedure however you should NOT DRIVE or use heavy machinery for 24 hours (because of the sedation medicines used during the test).    SYMPTOMS TO REPORT IMMEDIATELY: A gastroenterologist can be reached at any hour.  During normal business hours, 8:30 AM to 5:00 PM Monday through Friday, call [phone].  After hours and on weekends, please call the GI answering service at [phone] who will take a message and have the physician on call contact you.   Following lower endoscopy (colonoscopy or flexible sigmoidoscopy):  Excessive amounts of blood in the stool  Significant tenderness or worsening of abdominal pains  Swelling of the abdomen that is new, acute  Fever of 100F or higher  FOLLOW UP: If any biopsies were taken you will be contacted by phone or by letter within the next 1-3 weeks.  Call your gastroenterologist if you have not heard about the biopsies in 3 weeks.  Our staff will call the home number listed on your records the next business day following your procedure to check on you and address any questions or concerns that you [place] have at that time regarding the information given to you following your procedure. This is a courtesy call and so if there is no answer at the home number and we have not heard from you through the emergency physician on call, we will assume that you have returned  to your regular daily activities without incident.  SIGNATURES/CONFIDENTIALITY: You and/or your care partner have signed paperwork which will be entered into your electronic medical record.  These signatures attest to the fact that that the information above on your After Visit Summary has been reviewed and is understood.  Full of the confidentiality of this discharge information lies with you and/or your care-partner.  Diverticulosis, high fiber diet-handouts given  Repeat colonoscopy in 10 years  Wait for pathology

## 2012-12-10 ENCOUNTER — Telehealth: Payer: Self-pay | Admitting: *Deleted

## 2012-12-10 NOTE — Telephone Encounter (Signed)
  Follow up Call-  Call back number 12/09/2012  Post procedure Call Back phone  # [phone]  Permission to leave phone message Yes     Patient questions:  Do you have a fever, pain , or abdominal swelling? no Pain Score  0 *  Have you tolerated food without any problems? yes  Have you been able to return to your normal activities? yes  Do you have any questions about your discharge instructions: Diet   no Medications  no Follow up visit  no  Do you have questions or concerns about your Care? no  Actions: * If pain score is 4 or above: No action needed, pain <4.

## 2012-12-15 ENCOUNTER — Other Ambulatory Visit: Payer: Self-pay

## 2012-12-15 MED ORDER — BUDESONIDE 3 MG PO CP24: 9.0000 mg | ORAL_CAPSULE | ORAL | Status: DC

## 2012-12-18 ENCOUNTER — Telehealth: Payer: Self-pay | Admitting: Gastroenterology

## 2012-12-18 NOTE — Telephone Encounter (Signed)
Pt started having dizziness, increase heart rate and rash on her arm since starting on budesonide this past Wednesday.  Should she stop?

## 2012-12-18 NOTE — Telephone Encounter (Signed)
Yes, she should stop and instead take one imodium as needed for loose stools. rov should already be set for 3-4 weeks from now.

## 2012-12-18 NOTE — Telephone Encounter (Signed)
Pt is aware and will keep appt as scheduled pt also will go to the ER over the weekend if symptoms worsen

## 2013-01-13 ENCOUNTER — Ambulatory Visit (INDEPENDENT_AMBULATORY_CARE_PROVIDER_SITE_OTHER): Payer: BC Managed Care – PPO | Admitting: Gastroenterology

## 2013-01-13 ENCOUNTER — Encounter: Payer: Self-pay | Admitting: Gastroenterology

## 2013-01-13 VITALS — BP 118/80 | HR 74 | Ht 64.5 in | Wt 212.5 lb

## 2013-01-13 DIAGNOSIS — K5289 Other specified noninfective gastroenteritis and colitis: Secondary | ICD-10-CM

## 2013-01-13 DIAGNOSIS — K529 Noninfective gastroenteritis and colitis, unspecified: Secondary | ICD-10-CM

## 2013-01-13 MED ORDER — MESALAMINE 1000 MG RE SUPP: 1000.0000 mg | Freq: Every day | RECTAL | Status: DC

## 2013-01-13 MED ORDER — MESALAMINE 1.2 G PO TBEC: 4.8000 g | DELAYED_RELEASE_TABLET | Freq: Every day | ORAL | Status: DC

## 2013-01-13 NOTE — Patient Instructions (Addendum)
One of your biggest health concerns is your smoking.  This increases your risk for most cancers and serious cardiovascular diseases such as strokes, heart attacks.  You should try your best to stop.  If you need assistance, please contact your PCP or Smoking Cessation Class at [facility] [phone]) or [place] (1-800-QUIT-NOW). Will start lialda (4 pills once daily). Called into your pharmacy. Please return to see Dr. [name] in 4-5 weeks. New prescription called in for canasa suppository, place one suppository nightly.                                               We are excited to introduce MyChart, a new best-in-class service that provides you online access to important information in your electronic medical record. We want to make it easier for you to view your health information - all in one secure location - when and where you need it. We expect MyChart will enhance the quality of care and service we provide.  When you register for MyChart, you can:    View your test results.    Request appointments and receive appointment reminders via email.    Request medication renewals.    View your medical history, allergies, medications and immunizations.    Communicate with your physician's office through a password-protected site.    Conveniently print information such as your medication lists.  To find out if MyChart is right for you, please talk to a member of our clinical staff today. We will gladly answer your questions about this free health and wellness tool.  If you are age 69 or older and want a member of your family to have access to your record, you must provide written consent by completing a proxy form available at our office. Please speak to our clinical staff about guidelines regarding accounts for patients younger than age 55.  As you activate your MyChart account and need any technical assistance, please call the MyChart technical support line at [phone]  [phone]) or email your question to [email or web address]. If you email your question(s), please include your name, a return phone number and the best time to reach you.  If you have non-urgent health-related questions, you can send a message to our office through [place] at [email or web address]. If you have a medical emergency, call 911.  Thank you for using MyChart as your new health and wellness resource!   MyChart licensed from [employer],  1999-2010. Patents Pending.

## 2013-01-13 NOTE — Progress Notes (Signed)
Review of gastrointestinal problems:  1. small tubular adenoma in colon, removed September, 2009. Next colonoscopy September 2014  2. left-sided diverticulosis with diverticular associated edema noted by colonoscopy 2009  3. abnormal liver tests noted July, 2010: Likely from NASH Blood work August, 2010: Hepatitis A., B., C. were all negative; alpha one antitrypsin level normal, ceruloplasmin level normal, AMA negative, AMA negative, iron studies normal, anti-smooth muscle antibody negative, AST and ALT in 50-70 range; TTG negative, IgA level normal. Liver ultrasound suggested fatty liver disease. 4. Change in bowels, minor bleeding;  Colonoscopy [facility] 2014: The colonic mucosa was slightly erythematous throughout. Biopsies were taken randomly and sent to pathology. There were multiple diverticulum in the left colon. The terminal ileum was normal. No polyps or cancers. Pathology suggested lymphocytic colitis. Started on budesonide 3 pills daily.   HPI: This is a     pleasant 62 year old woman whom I last saw about a month ago at the time of colonoscopy.  She started budesonide, but had tachycardia.  She stopped the budesonide, was recommended to start imodium instead but only does that on PRN basis due to bloating side effect.  On zero meds now, she has at least 6-7 watery stools per day.  Very brisk gastro colic reflex.  She asked if stress could be playing a role in her symptoms.  Under a lot of stress from husband, he has been short tempered with her since he became sick.   Past Medical History  Diagnosis Date  . Adenomatous polyp   . Diverticulosis   . NASH (nonalcoholic steatohepatitis)   . Hypertension   . Thyroid disease   . Diabetes   . Anxiety   . Arrhythmia     Past Surgical History  Procedure Laterality Date  . Oopherectomy      Current Outpatient Prescriptions  Medication Sig Dispense Refill  . LORazepam (ATIVAN) 0.5 MG tablet Take 0.5 mg by mouth every 8 (eight)  hours.      Marland Kitchen losartan (COZAAR) 100 MG tablet Take 100 mg by mouth daily.      . metoprolol succinate (TOPROL-XL) 25 MG 24 hr tablet Take 25 mg by mouth 2 (two) times daily.      . sertraline (ZOLOFT) 50 MG tablet Take 50 mg by mouth daily.      Marland Kitchen zolpidem (AMBIEN) 5 MG tablet Take 5 mg by mouth at bedtime as needed for sleep.       No current facility-administered medications for this visit.    Allergies as of 01/13/2013 - Review Complete 01/13/2013  Allergen Reaction Noted  . Celecoxib    . Prednisone Itching 01/13/2013    Family History  Problem Relation Age of Onset  . Lung cancer Father   . Heart disease Father   . Alzheimer's disease Mother     History   Social History  . Marital Status: Married    Spouse Name: N/A    Number of Children: N/A  . Years of Education: N/A   Occupational History  . Not on file.   Social History Main Topics  . Smoking status: Current Every Day Smoker  . Smokeless tobacco: Never Used  . Alcohol Use: No  . Drug Use: No  . Sexually Active: Not on file   Other Topics Concern  . Not on file   Social History Narrative  . No narrative on file      Physical Exam: BP 118/80  Pulse 74  Ht 5' 4.5" (1.638 m)  Wt  212 lb 8 oz (96.389 kg)  BMI 35.93 kg/m2 Constitutional: generally well-appearing Psychiatric: alert and oriented x3 Abdomen: soft, nontender, nondistended, no obvious ascites, no peritoneal signs, normal bowel sounds     Assessment and plan: 62 y.o. female with  lymphocytic colitis on biopsy  She is intolerant to budesonide which caused tachycardia. I'm going to try her on mesalamine. It is not clear to me that she has simply lymphocytic colitis or perhaps mild overt colitis since her: Mucosa did look very slightly erythematous. She'll start mesalamine full dose once daily. She has significant rectal discomfort, spasms almost. I'm going to try her on mesalamine suppository once nightly as well.

## 2013-01-18 ENCOUNTER — Telehealth: Payer: Self-pay | Admitting: Gastroenterology

## 2013-01-18 NOTE — Telephone Encounter (Signed)
Pt was advised to cal her PCP and ask what she should do for her diabetes, pt agrees

## 2013-02-16 ENCOUNTER — Ambulatory Visit: Payer: BC Managed Care – PPO | Admitting: Gastroenterology

## 2013-03-22 ENCOUNTER — Ambulatory Visit: Payer: BC Managed Care – PPO | Admitting: Gastroenterology

## 2016-05-10 ENCOUNTER — Telehealth: Payer: Self-pay | Admitting: Cardiovascular Disease

## 2016-05-10 NOTE — Telephone Encounter (Signed)
Received records from [facility]. For apt with Dr. Loletha Grayer on 06/04/2016 2 pm. Records given to [facility] (medical records) 05/10/2016 mwc

## 2016-06-04 ENCOUNTER — Ambulatory Visit: Payer: Medicare Other | Admitting: Cardiovascular Disease

## 2016-07-18 ENCOUNTER — Ambulatory Visit: Payer: Medicare Other | Admitting: Cardiovascular Disease

## 2016-08-13 DIAGNOSIS — G4700 Insomnia, unspecified: Secondary | ICD-10-CM | POA: Diagnosis not present

## 2016-08-13 DIAGNOSIS — E119 Type 2 diabetes mellitus without complications: Secondary | ICD-10-CM | POA: Diagnosis not present

## 2016-08-13 DIAGNOSIS — E559 Vitamin D deficiency, unspecified: Secondary | ICD-10-CM | POA: Diagnosis not present

## 2016-08-13 DIAGNOSIS — F418 Other specified anxiety disorders: Secondary | ICD-10-CM | POA: Diagnosis not present

## 2016-09-14 ENCOUNTER — Emergency Department (HOSPITAL_COMMUNITY): Payer: Medicare HMO

## 2016-09-14 ENCOUNTER — Observation Stay (HOSPITAL_COMMUNITY)
Admission: EM | Admit: 2016-09-14 | Discharge: 2016-09-16 | Disposition: A | Discharge status: Home / Self Care | Payer: Medicare HMO | Attending: Internal Medicine | Admitting: Internal Medicine

## 2016-09-14 ENCOUNTER — Encounter (HOSPITAL_COMMUNITY): Payer: Self-pay | Admitting: Emergency Medicine

## 2016-09-14 DIAGNOSIS — T50901A Poisoning by unspecified drugs, medicaments and biological substances, accidental (unintentional), initial encounter: Secondary | ICD-10-CM | POA: Diagnosis present

## 2016-09-14 DIAGNOSIS — E119 Type 2 diabetes mellitus without complications: Secondary | ICD-10-CM | POA: Diagnosis not present

## 2016-09-14 DIAGNOSIS — F32A Depression, unspecified: Secondary | ICD-10-CM | POA: Diagnosis present

## 2016-09-14 DIAGNOSIS — Z880 Allergy status to penicillin: Secondary | ICD-10-CM | POA: Diagnosis not present

## 2016-09-14 DIAGNOSIS — Z79899 Other long term (current) drug therapy: Secondary | ICD-10-CM | POA: Insufficient documentation

## 2016-09-14 DIAGNOSIS — K7581 Nonalcoholic steatohepatitis (NASH): Secondary | ICD-10-CM

## 2016-09-14 DIAGNOSIS — I10 Essential (primary) hypertension: Secondary | ICD-10-CM | POA: Diagnosis not present

## 2016-09-14 DIAGNOSIS — K7460 Unspecified cirrhosis of liver: Secondary | ICD-10-CM

## 2016-09-14 DIAGNOSIS — R296 Repeated falls: Secondary | ICD-10-CM | POA: Diagnosis not present

## 2016-09-14 DIAGNOSIS — F17200 Nicotine dependence, unspecified, uncomplicated: Secondary | ICD-10-CM | POA: Insufficient documentation

## 2016-09-14 DIAGNOSIS — G9340 Encephalopathy, unspecified: Secondary | ICD-10-CM | POA: Diagnosis present

## 2016-09-14 DIAGNOSIS — R4182 Altered mental status, unspecified: Secondary | ICD-10-CM | POA: Diagnosis not present

## 2016-09-14 DIAGNOSIS — E538 Deficiency of other specified B group vitamins: Secondary | ICD-10-CM | POA: Diagnosis not present

## 2016-09-14 DIAGNOSIS — F0390 Unspecified dementia without behavioral disturbance: Secondary | ICD-10-CM | POA: Diagnosis not present

## 2016-09-14 DIAGNOSIS — Z9189 Other specified personal risk factors, not elsewhere classified: Secondary | ICD-10-CM

## 2016-09-14 DIAGNOSIS — R410 Disorientation, unspecified: Secondary | ICD-10-CM | POA: Diagnosis not present

## 2016-09-14 DIAGNOSIS — D696 Thrombocytopenia, unspecified: Secondary | ICD-10-CM | POA: Diagnosis not present

## 2016-09-14 DIAGNOSIS — R161 Splenomegaly, not elsewhere classified: Secondary | ICD-10-CM

## 2016-09-14 DIAGNOSIS — E039 Hypothyroidism, unspecified: Secondary | ICD-10-CM | POA: Diagnosis not present

## 2016-09-14 DIAGNOSIS — F332 Major depressive disorder, recurrent severe without psychotic features: Secondary | ICD-10-CM | POA: Diagnosis not present

## 2016-09-14 DIAGNOSIS — T50904A Poisoning by unspecified drugs, medicaments and biological substances, undetermined, initial encounter: Secondary | ICD-10-CM | POA: Diagnosis not present

## 2016-09-14 DIAGNOSIS — Z7984 Long term (current) use of oral hypoglycemic drugs: Secondary | ICD-10-CM | POA: Diagnosis not present

## 2016-09-14 DIAGNOSIS — T424X1A Poisoning by benzodiazepines, accidental (unintentional), initial encounter: Principal | ICD-10-CM | POA: Diagnosis present

## 2016-09-14 DIAGNOSIS — Z888 Allergy status to other drugs, medicaments and biological substances status: Secondary | ICD-10-CM | POA: Diagnosis not present

## 2016-09-14 DIAGNOSIS — F1310 Sedative, hypnotic or anxiolytic abuse, uncomplicated: Secondary | ICD-10-CM | POA: Diagnosis present

## 2016-09-14 DIAGNOSIS — Z7289 Other problems related to lifestyle: Secondary | ICD-10-CM | POA: Diagnosis not present

## 2016-09-14 DIAGNOSIS — F329 Major depressive disorder, single episode, unspecified: Secondary | ICD-10-CM | POA: Diagnosis present

## 2016-09-14 DIAGNOSIS — J40 Bronchitis, not specified as acute or chronic: Secondary | ICD-10-CM | POA: Diagnosis not present

## 2016-09-14 LAB — CBC WITH DIFFERENTIAL/PLATELET
Basophils Absolute: 0 10*3/uL (ref 0.0–0.1)
Basophils Relative: 0 %
EOS ABS: 0.1 10*3/uL (ref 0.0–0.7)
EOS PCT: 2 %
HCT: 34.4 % — ABNORMAL LOW (ref 36.0–46.0)
Hemoglobin: 11.7 g/dL — ABNORMAL LOW (ref 12.0–15.0)
LYMPHS ABS: 2.4 10*3/uL (ref 0.7–4.0)
LYMPHS PCT: 36 %
MCH: 27.7 pg (ref 26.0–34.0)
MCHC: 34 g/dL (ref 30.0–36.0)
MCV: 81.5 fL (ref 78.0–100.0)
MONO ABS: 0.7 10*3/uL (ref 0.1–1.0)
Monocytes Relative: 11 %
Neutro Abs: 3.3 10*3/uL (ref 1.7–7.7)
Neutrophils Relative %: 51 %
PLATELETS: 78 10*3/uL — AB (ref 150–400)
RBC: 4.22 MIL/uL (ref 3.87–5.11)
RDW: 15.2 % (ref 11.5–15.5)
WBC: 6.5 10*3/uL (ref 4.0–10.5)

## 2016-09-14 LAB — COMPREHENSIVE METABOLIC PANEL
ALT: 15 U/L (ref 14–54)
ANION GAP: 8 (ref 5–15)
AST: 29 U/L (ref 15–41)
Albumin: 4 g/dL (ref 3.5–5.0)
Alkaline Phosphatase: 87 U/L (ref 38–126)
BUN: 12 mg/dL (ref 6–20)
CHLORIDE: 107 mmol/L (ref 101–111)
CO2: 26 mmol/L (ref 22–32)
CREATININE: 0.86 mg/dL (ref 0.44–1.00)
Calcium: 9.6 mg/dL (ref 8.9–10.3)
Glucose, Bld: 93 mg/dL (ref 65–99)
POTASSIUM: 3.5 mmol/L (ref 3.5–5.1)
SODIUM: 141 mmol/L (ref 135–145)
Total Bilirubin: 1.4 mg/dL — ABNORMAL HIGH (ref 0.3–1.2)
Total Protein: 7.7 g/dL (ref 6.5–8.1)

## 2016-09-14 LAB — RAPID URINE DRUG SCREEN, HOSP PERFORMED
AMPHETAMINES: NOT DETECTED
BARBITURATES: NOT DETECTED
BENZODIAZEPINES: POSITIVE — AB
COCAINE: NOT DETECTED
OPIATES: NOT DETECTED
TETRAHYDROCANNABINOL: NOT DETECTED

## 2016-09-14 LAB — APTT: APTT: 40 s — AB (ref 24–36)

## 2016-09-14 LAB — ETHANOL: Alcohol, Ethyl (B): <5 mg/dL (ref <5)

## 2016-09-14 LAB — I-STAT CHEM 8, ED
BUN: 10 mg/dL (ref 6–20)
CALCIUM ION: 1.17 mmol/L (ref 1.15–1.40)
CREATININE: 0.8 mg/dL (ref 0.44–1.00)
Chloride: 108 mmol/L (ref 101–111)
GLUCOSE: 90 mg/dL (ref 65–99)
HCT: 33 % — ABNORMAL LOW (ref 36.0–46.0)
HEMOGLOBIN: 11.2 g/dL — AB (ref 12.0–15.0)
POTASSIUM: 3.5 mmol/L (ref 3.5–5.1)
Sodium: 143 mmol/L (ref 135–145)
TCO2: 25 mmol/L (ref 0–100)

## 2016-09-14 LAB — LACTATE DEHYDROGENASE: LDH: 139 U/L (ref 98–192)

## 2016-09-14 LAB — CBC
HCT: 33.6 % — ABNORMAL LOW (ref 36.0–46.0)
Hemoglobin: 11.4 g/dL — ABNORMAL LOW (ref 12.0–15.0)
MCH: 27.7 pg (ref 26.0–34.0)
MCHC: 33.9 g/dL (ref 30.0–36.0)
MCV: 81.6 fL (ref 78.0–100.0)
PLATELETS: 74 10*3/uL — AB (ref 150–400)
RBC: 4.12 MIL/uL (ref 3.87–5.11)
RDW: 15.2 % (ref 11.5–15.5)
WBC: 6.2 10*3/uL (ref 4.0–10.5)

## 2016-09-14 LAB — PROTIME-INR
INR: 1.1
PROTHROMBIN TIME: 14.3 s (ref 11.4–15.2)

## 2016-09-14 LAB — URINALYSIS, ROUTINE W REFLEX MICROSCOPIC
BILIRUBIN URINE: NEGATIVE
GLUCOSE, UA: NEGATIVE mg/dL
Hgb urine dipstick: NEGATIVE
KETONES UR: NEGATIVE mg/dL
Leukocytes, UA: NEGATIVE
NITRITE: NEGATIVE
PH: 5 (ref 5.0–8.0)
Protein, ur: NEGATIVE mg/dL
SPECIFIC GRAVITY, URINE: 1.02 (ref 1.005–1.030)

## 2016-09-14 LAB — CBG MONITORING, ED: Glucose-Capillary: 87 mg/dL (ref 65–99)

## 2016-09-14 LAB — AMMONIA: Ammonia: 35 umol/L (ref 9–35)

## 2016-09-14 LAB — SALICYLATE LEVEL: Salicylate Lvl: <7 mg/dL (ref 2.8–30.0)

## 2016-09-14 LAB — TECHNOLOGIST SMEAR REVIEW

## 2016-09-14 LAB — ACETAMINOPHEN LEVEL

## 2016-09-14 MED ORDER — THIAMINE HCL 100 MG/ML IJ SOLN
100.0000 mg | Freq: Once | INTRAMUSCULAR | Status: AC
  Administered 2016-09-14: 100 mg via INTRAVENOUS
  Filled 2016-09-14: qty 2

## 2016-09-14 NOTE — H&P (Signed)
Kelly [name] UXN:[id number] DOB: [birth year] DOA: 09/14/2016     PCP: Horatio Pel, MD   Outpatient Specialists: Cardiology [place], GI Leda Min Patient coming from: home Lives With family but husband is in SNF   Chief Complaint: confusion  HPI: Kelly [name] is a 66 y.o. female with medical history significant of NASH, diverticulosis, depression lower GI bleed  Small tubular adenoma colon, recurrent UTIs, DM  Presented with worsening confusion family has become very concerned patient had had decreased by mouth intake and has not been drinking much fluids. Patient has possibly been taking extra benzodiazepine since unclear if this was accidental versus intentional. Apparently patient has been taking her husband's benzodiazepines. She has been more lethargic endorses depression. She haven't had any nausea vomiting diarrhea fevers or chills. Family members suspect the patient has been abusing benzodiazepines her husband is currently at the nursing home facility after leg amputation. She's been staying at home alone. She stopped answering the phone over door when the family came to the door and they had to break it down in order to get inside. Patient was a confusion and attempting to use remote controls as telephone. Not oriented she has been falling asleep in the middle of sentences family was very concerned for her safety.    Regarding pertinent Chronic problems: History of GI bleed followed by GI also history of Karlene Lineman has been worked up prior to this if no other etiology of liver disease   IN ER:  Temp (24hrs), Avg:98.5 F (36.9 C), Min:98.5 F (36.9 C), Max:98.5 F (36.9 C)      Rr 24 (6%HR 68 BP 159/79 Na 143 K 3.5 Cr 0.8 WBC 6.5 Hg 11.7 plt 78 ALb 4.0 Total bili 1.4 Hg 11.2 utox benzo positive  CT head nonacute CXR nonacute Following Medications were ordered in ER: Medications - No data to display   Hospitalist was called for admission for acute  encephalopathy likely secondary to benzodiazepine abuse  Review of Systems:    Pertinent positives include:confusion  Constitutional:  No weight loss, night sweats, Fevers, chills, fatigue, weight loss  HEENT:  No headaches, Difficulty swallowing,Tooth/dental problems,Sore throat,  No sneezing, itching, ear ache, nasal congestion, post nasal drip,  Cardio-vascular:  No chest pain, Orthopnea, PND, anasarca, dizziness, palpitations.no Bilateral lower extremity swelling  GI:  No heartburn, indigestion, abdominal pain, nausea, vomiting, diarrhea, change in bowel habits, loss of appetite, melena, blood in stool, hematemesis Resp:  no shortness of breath at rest. No dyspnea on exertion, No excess mucus, no productive cough, No non-productive cough, No coughing up of blood.No change in color of mucus.No wheezing. Skin:  no rash or lesions. No jaundice GU:  no dysuria, change in color of urine, no urgency or frequency. No straining to urinate.  No flank pain.  Musculoskeletal:  No joint pain or no joint swelling. No decreased range of motion. No back pain.  Psych:  No change in mood or affect. No depression or anxiety. No memory loss.  Neuro: no localizing neurological complaints, no tingling, no weakness, no double vision, no gait abnormality, no slurred speech, no confusion  As per HPI otherwise 10 point review of systems negative.   Past Medical History: Past Medical History:  Diagnosis Date  . Adenomatous polyp   . Anxiety   . Arrhythmia   . Diabetes ([place])   . Diverticulosis   . Fatty liver   . Hyperlipidemia   . Hypertension   . NASH (nonalcoholic steatohepatitis)   .  Obesity   . SOB (shortness of breath)   . Thrombocytopenia ([place])   . Thyroid disease   . Vitamin D deficiency    Past Surgical History:  Procedure Laterality Date  . oopherectomy       Social History:  Ambulatory  Independently     reports that she has been smoking.  She has never used smokeless  tobacco. She reports that she does not drink alcohol or use drugs.  Allergies:   Allergies  Allergen Reactions  . Celecoxib   . Prednisone Itching    Heart rate increased  . Macrodantin [Nitrofurantoin Macrocrystal] Rash  . Penicillins Hives       Family History:   Family History  Problem Relation Age of Onset  . Lung cancer Father   . Heart disease Father   . Alzheimer's disease Mother     Medications: Prior to Admission medications   Medication Sig Start Date End Date Taking? Authorizing Provider  ALPRAZolam Duanne Moron) 0.25 MG tablet Take 1 tablet by mouth 2 (two) times daily as needed. 05/08/16   Historical Provider, MD  glimepiride (AMARYL) 1 MG tablet Take 1 mg by mouth daily with breakfast.    Historical Provider, MD  levothyroxine (SYNTHROID, LEVOTHROID) 75 MCG tablet Take 75 mcg by mouth daily. 05/08/16   Historical Provider, MD  LORazepam (ATIVAN) 0.5 MG tablet Take 0.5 mg by mouth every 8 (eight) hours.    Historical Provider, MD  losartan (COZAAR) 100 MG tablet Take 100 mg by mouth daily.    Historical Provider, MD  mesalamine (CANASA) 1000 MG suppository Place 1 suppository (1,000 mg total) rectally at bedtime. 01/13/13 01/13/14  [name], MD  mesalamine (LIALDA) 1.2 G EC tablet Take 4 tablets (4.8 g total) by mouth daily with breakfast. 01/13/13   [name], MD  metFORMIN (GLUCOPHAGE-XR) 500 MG 24 hr tablet Take 500 mg by mouth 2 (two) times daily. 03/13/16   Historical Provider, MD  metoprolol succinate (TOPROL-XL) 25 MG 24 hr tablet Take 25 mg by mouth 2 (two) times daily.    Historical Provider, MD  metoprolol tartrate (LOPRESSOR) 25 MG tablet Take 25 mg by mouth 2 (two) times daily. 03/13/16   Historical Provider, MD  pravastatin (PRAVACHOL) 40 MG tablet Take 40 mg by mouth daily.    Historical Provider, MD  sertraline (ZOLOFT) 50 MG tablet Take 50 mg by mouth daily.    Historical Provider, MD  zolpidem (AMBIEN) 5 MG tablet Take 5 mg by mouth at bedtime as  needed for sleep.    Historical Provider, MD    Physical Exam: Patient Vitals for the past 24 hrs:  BP Temp Temp src Pulse Resp SpO2  09/14/16 1554 150/73 - - 65 22 94 %  09/14/16 1523 - - - - - 96 %  09/14/16 1513 148/80 98.5 F (36.9 C) Oral 65 16 92 %    1. General:  in No Acute distress 2. Psychological: Alert but not Oriented, with confabulation 3. Head/ENT:     Dry Mucous Membranes                          Head Non traumatic, neck supple                            Poor Dentition 4. SKIN:  decreased Skin turgor,  Skin clean Dry and intact no rash 5. Heart: Regular rate and  rhythm  2/6 Murmur, Rub or gallop 6. Lungs:  Clear to auscultation bilaterally, no wheezes or crackles   7. Abdomen: Soft,  non-tender, Non distended 8. Lower extremities: no clubbing, cyanosis, or edema 9. Neurologically   strength 5 out of 5 in all 4 extremities cranial nerves II through XII intact 10. MSK: Normal range of motion   body mass index is unknown because there is no height or weight on file.  Labs on Admission:   Labs on Admission: I have personally reviewed following labs and imaging studies  CBC:  Recent Labs Lab 09/14/16 1622 09/14/16 1636  WBC 6.5  --   NEUTROABS 3.3  --   HGB 11.7* 11.2*  HCT 34.4* 33.0*  MCV 81.5  --   PLT 78*  --    Basic Metabolic Panel:  Recent Labs Lab 09/14/16 1622 09/14/16 1636  NA 141 143  K 3.5 3.5  CL 107 108  CO2 26  --   GLUCOSE 93 90  BUN 12 10  CREATININE 0.86 0.80  CALCIUM 9.6  --    GFR: CrCl cannot be calculated (Unknown ideal weight.). Liver Function Tests:  Recent Labs Lab 09/14/16 1622  AST 29  ALT 15  ALKPHOS 87  BILITOT 1.4*  PROT 7.7  ALBUMIN 4.0   No results for input(s): LIPASE, AMYLASE in the last 168 hours.  Recent Labs Lab 09/14/16 1623  AMMONIA 35   Coagulation Profile: No results for input(s): INR, PROTIME in the last 168 hours. Cardiac Enzymes: No results for input(s): CKTOTAL, CKMB, CKMBINDEX,  TROPONINI in the last 168 hours. BNP (last 3 results) No results for input(s): PROBNP in the last 8760 hours. HbA1C: No results for input(s): HGBA1C in the last 72 hours. CBG:  Recent Labs Lab 09/14/16 1631  GLUCAP 87   Lipid Profile: No results for input(s): CHOL, HDL, LDLCALC, TRIG, CHOLHDL, LDLDIRECT in the last 72 hours. Thyroid Function Tests: No results for input(s): TSH, T4TOTAL, FREET4, T3FREE, THYROIDAB in the last 72 hours. Anemia Panel: No results for input(s): VITAMINB12, FOLATE, FERRITIN, TIBC, IRON, RETICCTPCT in the last 72 hours. Urine analysis:    Component Value Date/Time   COLORURINE AMBER (A) 09/14/2016 1625   APPEARANCEUR HAZY (A) 09/14/2016 1625   LABSPEC 1.020 09/14/2016 1625   PHURINE 5.0 09/14/2016 1625   GLUCOSEU NEGATIVE 09/14/2016 1625   HGBUR NEGATIVE 09/14/2016 1625   BILIRUBINUR NEGATIVE 09/14/2016 1625   KETONESUR NEGATIVE 09/14/2016 1625   PROTEINUR NEGATIVE 09/14/2016 1625   NITRITE NEGATIVE 09/14/2016 1625   LEUKOCYTESUR NEGATIVE 09/14/2016 1625   Sepsis Labs: @LABRCNTIP (procalcitonin:4,lacticidven:4) )No results found for this or any previous visit (from the past 240 hour(s)).     UA  no evidence of UTI     No results found for: HGBA1C  CrCl cannot be calculated (Unknown ideal weight.).  BNP (last 3 results) No results for input(s): PROBNP in the last 8760 hours.   ECG REPORT  Independently reviewed Rate: 69  Rhythm: abnormal r wave progression ST&T Change: T wave inversion. QTC 466  There were no vitals filed for this visit.   Cultures: No results found for: SDES, SPECREQUEST, CULT, REPTSTATUS   Radiological Exams on Admission: Dg Chest 2 View  Result Date: 09/14/2016 CLINICAL DATA:  Urinary tract infection. EXAM: CHEST  2 VIEW COMPARISON:  None. FINDINGS: Normal cardiac silhouette. Chronic bronchitic markings centrally. No focal infiltrate. No pleural fluid. No acute osseous abnormality. IMPRESSION: Chronic  bronchitic markings.  No clear acute findings. Electronically Signed  By: Suzy Bouchard M.D.   On: 09/14/2016 17:35    Chart has been reviewed    Assessment/Plan  66 y.o. female with medical history significant of NASH, diverticulosis, depression lower GI bleed  Small tubular adenoma colon, recurrent UTIs admitted For acute encephalopathy likely secondary to benzodiazepine overdose with her husband's medications  Present on Admission: . Acute encephalopathy - Most likely secondary to overdose of unknown medication patient is confused unable to tell me what medications to deny suicidal ideations, given persistent word finding difficulty and confabulation will obtain MRI if able to tolerate in the morning will evaluate for any organic causes . 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 Likely for severe anxiety self treating. We'll appreciate psychiatry consult. Hold all medications for now as it's unclear what she has taking and how much monitor on telemetry for any QRS or QT abnormalities . Hypertension stable continue to monitor restart home medications if able . Depression restart home medications once and has been seen by psychiatry prior to going need to have adjustment     Other plan as per orders.  DVT prophylaxis:  SCD   Code Status:  FULL CODE   as per  family   Family Communication:   Family  at  Bedside  plan of care was discussed with  Son, Daughter in Sports coach,    Disposition Plan:    likely will need placement for rehabilitation                                               Would benefit from PT/OT eval prior to DC  ordered                      Social Work   Nutrition    consulted                          Consults called: behavioral health  Admission status:  obs   Level of care    tele       I have spent a total of 63mn on this admission   Sanaia Jasso 09/14/2016, 9:32 PM    [facility]  Pager [phone]  after 2  AM please page floor coverage PA If 7AM-7PM, please contact the day team taking care of the patient  [email or web address]  Password [email or web address]

## 2016-09-14 NOTE — ED Provider Notes (Signed)
[place] DEPT Provider Note   CSN: [id number] Arrival date & time: 09/14/16  1505     History   Chief Complaint Chief Complaint  Patient presents with  . Drug Overdose  . Recurrent UTI    HPI Kelly [name] is a 66 y.o. female who is bib her son for Altered mental status. There is a level 5 caveat due to his . The son gives the history. He is concerned that she is abusing her alprazolam. The patient's husband was placed in a SNF 3 weeks ago. The son states that since that time, he has found her to be altered and confused and appearing stones. He states that at times she is confused, slurring her speech, falling asleep in the middle of sentences and stumbling around the house. He is concerned for her safety. He and his aunt went over today to check on her. He states he had break into her house because he is afraid she was hurt. He found her in the home. He had to convince her to give him the medication. She's been taking. She is currently taking her husband's prescription of alprazolam and states that her doctor told her to take 2 of his alprazolam twice a day. According to the [facility] controlled substances reporting system. Her last legitimate prescription of alprazolam 0.25 mg was prescribed on 06/24/2016 and was a 15 day supply with a quantity of 30. The patient has had no other recent illnesses. She denies any abdominal pain, urinary symptoms, chest pain, shortness of breath. There've been no notable focal neurologic deficits. The son states that at times she is lucid and then other times when he is found her. She's been very confused.  HPI  Past Medical History:  Diagnosis Date  . Adenomatous polyp   . Anxiety   . Arrhythmia   . Diabetes ([place])   . Diverticulosis   . Fatty liver   . Hyperlipidemia   . Hypertension   . NASH (nonalcoholic steatohepatitis)   . Obesity   . SOB (shortness of breath)   . Thrombocytopenia ([place])   . Thyroid disease   . Vitamin D deficiency       Patient Active Problem List   Diagnosis Date Noted  . NONSPEC ELEVATION OF LEVELS OF TRANSAMINASE/LDH 02/28/2009  . DIVERTICULITIS OF COLON 06/14/2008  . Irritable bowel syndrome 04/06/2008    Past Surgical History:  Procedure Laterality Date  . oopherectomy      OB History    No data available       Home Medications    Prior to Admission medications   Medication Sig Start Date End Date Taking? Authorizing Provider  ALPRAZolam Duanne Moron) 0.25 MG tablet Take 1 tablet by mouth 2 (two) times daily as needed. 05/08/16   Historical Provider, MD  glimepiride (AMARYL) 1 MG tablet Take 1 mg by mouth daily with breakfast.    Historical Provider, MD  levothyroxine (SYNTHROID, LEVOTHROID) 75 MCG tablet Take 75 mcg by mouth daily. 05/08/16   Historical Provider, MD  LORazepam (ATIVAN) 0.5 MG tablet Take 0.5 mg by mouth every 8 (eight) hours.    Historical Provider, MD  losartan (COZAAR) 100 MG tablet Take 100 mg by mouth daily.    Historical Provider, MD  mesalamine (CANASA) 1000 MG suppository Place 1 suppository (1,000 mg total) rectally at bedtime. 01/13/13 01/13/14  [name], MD  mesalamine (LIALDA) 1.2 G EC tablet Take 4 tablets (4.8 g total) by mouth daily with breakfast. 01/13/13  [name], MD  metFORMIN (GLUCOPHAGE-XR) 500 MG 24 hr tablet Take 500 mg by mouth 2 (two) times daily. 03/13/16   Historical Provider, MD  metoprolol succinate (TOPROL-XL) 25 MG 24 hr tablet Take 25 mg by mouth 2 (two) times daily.    Historical Provider, MD  metoprolol tartrate (LOPRESSOR) 25 MG tablet Take 25 mg by mouth 2 (two) times daily. 03/13/16   Historical Provider, MD  pravastatin (PRAVACHOL) 40 MG tablet Take 40 mg by mouth daily.    Historical Provider, MD  sertraline (ZOLOFT) 50 MG tablet Take 50 mg by mouth daily.    Historical Provider, MD  zolpidem (AMBIEN) 5 MG tablet Take 5 mg by mouth at bedtime as needed for sleep.    Historical Provider, MD    Family History Family History   Problem Relation Age of Onset  . Lung cancer Father   . Heart disease Father   . Alzheimer's disease Mother     Social History Social History  Substance Use Topics  . Smoking status: Current Every Day Smoker  . Smokeless tobacco: Never Used  . Alcohol use No     Allergies   Celecoxib; Prednisone; Macrodantin [nitrofurantoin macrocrystal]; and Penicillins   Review of Systems Review of Systems  Ten systems reviewed and are negative for acute change, except as noted in the HPI.   Physical Exam Updated Vital Signs BP 150/73   Pulse 65   Temp 98.5 F (36.9 C) (Oral)   Resp 22   SpO2 94%   Physical Exam  Constitutional: She is oriented to person, place, and time. She appears well-developed and well-nourished. No distress.  HENT:  Head: Normocephalic and atraumatic.  Eyes: Conjunctivae are normal. No scleral icterus.  Neck: Normal range of motion.  Cardiovascular: Normal rate, regular rhythm and normal heart sounds.  Exam reveals no gallop and no friction rub.   No murmur heard. Pulmonary/Chest: Effort normal and breath sounds normal. No respiratory distress.  Abdominal: Soft. Bowel sounds are normal. She exhibits no distension and no mass. There is no tenderness. There is no guarding.  Neurological: She is alert and oriented to person, place, and time.  Speech is clear and goal oriented, follows commands Major Cranial nerves without deficit, no facial droop Normal strength in upper and lower extremities bilaterally including dorsiflexion and plantar flexion, strong and equal grip strength Sensation normal to light and sharp touch Moves extremities without ataxia, coordination intact    Skin: Skin is warm and dry. She is not diaphoretic.  Psychiatric:  Speech is rambling. The patient is unable to form logical points. She is not oriented to time or place. She seems to confabulate. Her speech is also slowed.     ED Treatments / Results  Labs (all labs ordered are  listed, but only abnormal results are displayed) Labs Reviewed  CBC WITH DIFFERENTIAL/PLATELET  AMMONIA  COMPREHENSIVE METABOLIC PANEL  RAPID URINE DRUG SCREEN, HOSP PERFORMED  ETHANOL  URINALYSIS, ROUTINE W REFLEX MICROSCOPIC  I-STAT CHEM 8, ED    EKG  EKG Interpretation None       Radiology No results found.  Procedures Procedures (including critical care time)  Medications Ordered in ED Medications - No data to display   Initial Impression / Assessment and Plan / ED Course  I have reviewed the triage vital signs and the nursing notes.  Pertinent labs & imaging results that were available during my care of the patient were reviewed by me and considered in my medical  decision making (see chart for details).      Patient's lab workup is not concerning. Chest x-ray is without abnormality. She is positive for benzodiazepines on her UDS. In further speaking with the patient's family. They're very concerned for her safety. Today they had to break into the house to check on her. Her son states that he found her wandering in circles in the house. He says that really she's had a very acute change in her mental status over the past 3 weeks which could be from drugs, but they're concerned for underlying dementia. The patient's mother died of Alzheimer's at the age of 47. The other examples of the patient being confused and trying to dial phone numbers on the remote control or water bottle.   Patient will be admitted for encephalopathy. Final Clinical Impressions(s) / ED Diagnoses   Final diagnoses:  Encephalopathy  Benzodiazepine abuse  Confusion  At risk for unsafe behavior  Confusion and disorientation    New Prescriptions New Prescriptions   No medications on file     Margarita Mail, PA-C 09/17/16 1714    Virgel Manifold, MD 09/23/16 1655

## 2016-09-14 NOTE — ED Notes (Signed)
Bed: WU59 Expected date:  Expected time:  Means of arrival:  Comments: 66 yo fatigue/depression

## 2016-09-14 NOTE — ED Triage Notes (Signed)
Patient is from home.  Family members are concerned because patient isn't eating, drinking, and perhaps mis-medication by accident.  Patient is lethargy today and feels depressed.  Patient is lacking sleep.  Patient has a history of UTI's.  Per EMS, patient sluggish and fatigue. Denies N,V,D, fever, or chilis.  Denies pain. No headache or dizzy spells. Patient isn't complaining of burning when using restroom.  Denies HI/SI.    Possibly patient could have taken her medication as well as husband's.  Family members suspect patient is taking Alprazolam, which belongs to spouse.    CBG: 124  BP: 145/66 HR:66 regular R:14 96% on room air

## 2016-09-15 ENCOUNTER — Observation Stay (HOSPITAL_COMMUNITY): Payer: Medicare HMO

## 2016-09-15 DIAGNOSIS — F17210 Nicotine dependence, cigarettes, uncomplicated: Secondary | ICD-10-CM | POA: Diagnosis not present

## 2016-09-15 DIAGNOSIS — Z880 Allergy status to penicillin: Secondary | ICD-10-CM

## 2016-09-15 DIAGNOSIS — Z79899 Other long term (current) drug therapy: Secondary | ICD-10-CM | POA: Diagnosis not present

## 2016-09-15 DIAGNOSIS — Z794 Long term (current) use of insulin: Secondary | ICD-10-CM

## 2016-09-15 DIAGNOSIS — F329 Major depressive disorder, single episode, unspecified: Secondary | ICD-10-CM | POA: Diagnosis not present

## 2016-09-15 DIAGNOSIS — G9340 Encephalopathy, unspecified: Secondary | ICD-10-CM | POA: Diagnosis not present

## 2016-09-15 DIAGNOSIS — D696 Thrombocytopenia, unspecified: Secondary | ICD-10-CM | POA: Diagnosis not present

## 2016-09-15 DIAGNOSIS — F1310 Sedative, hypnotic or anxiolytic abuse, uncomplicated: Secondary | ICD-10-CM | POA: Diagnosis not present

## 2016-09-15 DIAGNOSIS — T424X1A Poisoning by benzodiazepines, accidental (unintentional), initial encounter: Secondary | ICD-10-CM | POA: Diagnosis not present

## 2016-09-15 DIAGNOSIS — E119 Type 2 diabetes mellitus without complications: Secondary | ICD-10-CM

## 2016-09-15 DIAGNOSIS — K7581 Nonalcoholic steatohepatitis (NASH): Secondary | ICD-10-CM | POA: Diagnosis not present

## 2016-09-15 DIAGNOSIS — Z888 Allergy status to other drugs, medicaments and biological substances status: Secondary | ICD-10-CM | POA: Diagnosis not present

## 2016-09-15 DIAGNOSIS — F0390 Unspecified dementia without behavioral disturbance: Secondary | ICD-10-CM | POA: Diagnosis not present

## 2016-09-15 DIAGNOSIS — I10 Essential (primary) hypertension: Secondary | ICD-10-CM | POA: Diagnosis not present

## 2016-09-15 DIAGNOSIS — R161 Splenomegaly, not elsewhere classified: Secondary | ICD-10-CM | POA: Diagnosis not present

## 2016-09-15 DIAGNOSIS — K760 Fatty (change of) liver, not elsewhere classified: Secondary | ICD-10-CM | POA: Diagnosis not present

## 2016-09-15 DIAGNOSIS — E538 Deficiency of other specified B group vitamins: Secondary | ICD-10-CM | POA: Diagnosis not present

## 2016-09-15 DIAGNOSIS — Z810 Family history of intellectual disabilities: Secondary | ICD-10-CM

## 2016-09-15 HISTORY — DX: Type 2 diabetes mellitus without complications: E11.9

## 2016-09-15 LAB — TSH: TSH: 1.244 u[IU]/mL (ref 0.350–4.500)

## 2016-09-15 LAB — VITAMIN B12
VITAMIN B 12: 179 pg/mL — AB (ref 180–914)
VITAMIN B 12: 186 pg/mL (ref 180–914)

## 2016-09-15 LAB — CBC
HCT: 32.2 % — ABNORMAL LOW (ref 36.0–46.0)
HEMOGLOBIN: 11.1 g/dL — AB (ref 12.0–15.0)
MCH: 27.9 pg (ref 26.0–34.0)
MCHC: 34.5 g/dL (ref 30.0–36.0)
MCV: 80.9 fL (ref 78.0–100.0)
Platelets: 74 10*3/uL — ABNORMAL LOW (ref 150–400)
RBC: 3.98 MIL/uL (ref 3.87–5.11)
RDW: 14.8 % (ref 11.5–15.5)
WBC: 6.2 10*3/uL (ref 4.0–10.5)

## 2016-09-15 LAB — FOLATE: FOLATE: 15.6 ng/mL (ref >5.9)

## 2016-09-15 LAB — IRON AND TIBC
IRON: 61 ug/dL (ref 28–170)
SATURATION RATIOS: 17 % (ref 10.4–31.8)
TIBC: 353 ug/dL (ref 250–450)
UIBC: 292 ug/dL

## 2016-09-15 LAB — COMPREHENSIVE METABOLIC PANEL
ALBUMIN: 3.6 g/dL (ref 3.5–5.0)
ALK PHOS: 78 U/L (ref 38–126)
ALT: 15 U/L (ref 14–54)
ANION GAP: 6 (ref 5–15)
AST: 29 U/L (ref 15–41)
BILIRUBIN TOTAL: 0.8 mg/dL (ref 0.3–1.2)
BUN: 10 mg/dL (ref 6–20)
CALCIUM: 9 mg/dL (ref 8.9–10.3)
CO2: 27 mmol/L (ref 22–32)
Chloride: 109 mmol/L (ref 101–111)
Creatinine, Ser: 0.66 mg/dL (ref 0.44–1.00)
GFR calc Af Amer: >60 mL/min (ref >60)
GLUCOSE: 86 mg/dL (ref 65–99)
POTASSIUM: 3.5 mmol/L (ref 3.5–5.1)
Sodium: 142 mmol/L (ref 135–145)
TOTAL PROTEIN: 6.8 g/dL (ref 6.5–8.1)

## 2016-09-15 LAB — HIV ANTIBODY (ROUTINE TESTING W REFLEX): HIV Screen 4th Generation wRfx: NONREACTIVE

## 2016-09-15 LAB — RETICULOCYTES
RBC.: 3.99 MIL/uL (ref 3.87–5.11)
RETIC COUNT ABSOLUTE: 75.8 10*3/uL (ref 19.0–186.0)
Retic Ct Pct: 1.9 % (ref 0.4–3.1)

## 2016-09-15 LAB — GLUCOSE, CAPILLARY
GLUCOSE-CAPILLARY: 78 mg/dL (ref 65–99)
Glucose-Capillary: 82 mg/dL (ref 65–99)
Glucose-Capillary: 93 mg/dL (ref 65–99)

## 2016-09-15 LAB — HAPTOGLOBIN: Haptoglobin: 29 mg/dL — ABNORMAL LOW (ref 34–200)

## 2016-09-15 LAB — PREALBUMIN: PREALBUMIN: 10.8 mg/dL — AB (ref 18–38)

## 2016-09-15 LAB — RPR
RPR Ser Ql: NONREACTIVE
RPR: NONREACTIVE

## 2016-09-15 LAB — MAGNESIUM: MAGNESIUM: 2 mg/dL (ref 1.7–2.4)

## 2016-09-15 LAB — FERRITIN: FERRITIN: 26 ng/mL (ref 11–307)

## 2016-09-15 LAB — PHOSPHORUS: PHOSPHORUS: 3.7 mg/dL (ref 2.5–4.6)

## 2016-09-15 MED ORDER — ACETAMINOPHEN 325 MG PO TABS: 650.0000 mg | ORAL_TABLET | Freq: Four times a day (QID) | ORAL | Status: DC | PRN

## 2016-09-15 MED ORDER — FOLIC ACID 1 MG PO TABS
1.0000 mg | ORAL_TABLET | Freq: Every day | ORAL | Status: DC
  Administered 2016-09-15 – 2016-09-16 (×2): 1 mg via ORAL
  Filled 2016-09-15 (×2): qty 1

## 2016-09-15 MED ORDER — LEVOTHYROXINE SODIUM 50 MCG PO TABS
75.0000 ug | ORAL_TABLET | Freq: Every day | ORAL | Status: DC
  Administered 2016-09-15 – 2016-09-16 (×2): 75 ug via ORAL
  Filled 2016-09-15 (×2): qty 1

## 2016-09-15 MED ORDER — ADULT MULTIVITAMIN W/MINERALS CH
1.0000 | ORAL_TABLET | Freq: Every day | ORAL | Status: DC
  Administered 2016-09-15 – 2016-09-16 (×2): 1 via ORAL
  Filled 2016-09-15 (×2): qty 1

## 2016-09-15 MED ORDER — HYDROCHLOROTHIAZIDE 12.5 MG PO CAPS
12.5000 mg | ORAL_CAPSULE | Freq: Every day | ORAL | Status: DC
  Administered 2016-09-15 – 2016-09-16 (×2): 12.5 mg via ORAL
  Filled 2016-09-15 (×2): qty 1

## 2016-09-15 MED ORDER — HYDROCODONE-ACETAMINOPHEN 5-325 MG PO TABS: 1.0000 | ORAL_TABLET | ORAL | Status: DC | PRN

## 2016-09-15 MED ORDER — ONDANSETRON HCL 4 MG/2ML IJ SOLN: 4.0000 mg | Freq: Four times a day (QID) | INTRAMUSCULAR | Status: DC | PRN

## 2016-09-15 MED ORDER — LOSARTAN POTASSIUM-HCTZ 100-12.5 MG PO TABS: 1.0000 | ORAL_TABLET | Freq: Every day | ORAL | Status: DC

## 2016-09-15 MED ORDER — INSULIN ASPART 100 UNIT/ML ~~LOC~~ SOLN: 0.0000 [IU] | Freq: Three times a day (TID) | SUBCUTANEOUS | Status: DC

## 2016-09-15 MED ORDER — MESALAMINE 1.2 G PO TBEC
4.8000 g | DELAYED_RELEASE_TABLET | Freq: Every day | ORAL | Status: DC
  Filled 2016-09-15: qty 4

## 2016-09-15 MED ORDER — SODIUM CHLORIDE 0.9 % IV SOLN
INTRAVENOUS | Status: AC
  Administered 2016-09-15: 03:00:00 via INTRAVENOUS

## 2016-09-15 MED ORDER — INSULIN ASPART 100 UNIT/ML ~~LOC~~ SOLN: 0.0000 [IU] | Freq: Every day | SUBCUTANEOUS | Status: DC

## 2016-09-15 MED ORDER — ACETAMINOPHEN 650 MG RE SUPP: 650.0000 mg | Freq: Four times a day (QID) | RECTAL | Status: DC | PRN

## 2016-09-15 MED ORDER — LOSARTAN POTASSIUM 50 MG PO TABS
100.0000 mg | ORAL_TABLET | Freq: Every day | ORAL | Status: DC
  Administered 2016-09-15 – 2016-09-16 (×2): 100 mg via ORAL
  Filled 2016-09-15 (×2): qty 2

## 2016-09-15 MED ORDER — MESALAMINE 1000 MG RE SUPP: 1000.0000 mg | Freq: Every day | RECTAL | Status: DC

## 2016-09-15 MED ORDER — ONDANSETRON HCL 4 MG PO TABS: 4.0000 mg | ORAL_TABLET | Freq: Four times a day (QID) | ORAL | Status: DC | PRN

## 2016-09-15 MED ORDER — VITAMIN B-1 100 MG PO TABS
100.0000 mg | ORAL_TABLET | Freq: Every day | ORAL | Status: DC
  Administered 2016-09-15 – 2016-09-16 (×2): 100 mg via ORAL
  Filled 2016-09-15 (×2): qty 1

## 2016-09-15 MED ORDER — SODIUM CHLORIDE 0.9% FLUSH
3.0000 mL | Freq: Two times a day (BID) | INTRAVENOUS | Status: DC
  Administered 2016-09-15 – 2016-09-16 (×2): 3 mL via INTRAVENOUS

## 2016-09-15 NOTE — Evaluation (Signed)
Physical Therapy Evaluation Patient Details Name: Kelly [name] MRN: [id number] DOB: 1951-07-26 Today's Date: 09/15/2016   History of Present Illness  Kelly [name] is a 66 y.o. female with medical history significant of NASH, diverticulosis, depression lower GI bleed, small tubular adenoma of colon, recurrent UTIs, DM; pt  brought in by family due to confusion and the suspicion that she has been taking her husband's xanax while he has been at a SNF  Clinical Impression  Pt admitted with above diagnosis. Pt currently with functional limitations due to the deficits listed below (see PT Problem List). * Pt will benefit from skilled PT to increase their independence and safety with mobility to allow discharge to the venue listed below.   Recommend SNF, pt not agreeable at this time but her insight is significantly impaired during PT eval--she is perseverating on her cats and their care--she denies any knowledge of taking too many meds intentionally or otherwise     Follow Up Recommendations SNF;Supervision/Assistance - 24 hour    Equipment Recommendations  Rolling walker with 5" wheels    Recommendations for Other Services       Precautions / Restrictions Precautions Precautions: Fall Restrictions Weight Bearing Restrictions: No      Mobility  Bed Mobility Overal bed mobility: Needs Assistance Bed Mobility: Supine to Sit     Supine to sit: Min guard     General bed mobility comments: to elevate trunk, incr time, cues to position self for standing/place feet on floor  Transfers Overall transfer level: Needs assistance Equipment used: Rolling walker (2 wheeled) Transfers: Sit to/from Stand Sit to Stand: Min assist         General transfer comment: assist to rise, pt with incr postural sway upon standing  Ambulation/Gait Ambulation/Gait assistance: Min assist;Mod assist Ambulation Distance (Feet): 35 Feet Assistive device: Rolling walker (2 wheeled) Gait Pattern/deviations:  Step-through pattern;Decreased stride length;Drifts right/left     General Gait Details: pt requires assist to maneuver RW and for balance/fall prevention throughout distance above; she has great difficulty problem solving how to turn with RW and to get to her recliner, requires multi-modal cues throughout for safety, technique and sequencing  Stairs            Wheelchair Mobility    Modified Rankin (Stroke Patients Only)       Balance Overall balance assessment: History of Falls;Needs assistance Sitting-balance support: No upper extremity supported;Feet supported Sitting balance-Leahy Scale: Fair       Standing balance-Leahy Scale: Poor Standing balance comment: pt with incr postural sway in standing, braces posterior LEs on bed to prevent fall, LOB x 1 in static stand without UE support, reactions delayed                             Pertinent Vitals/Pain Pain Assessment: Faces Faces Pain Scale: Hurts a little bit Pain Location: legs Pain Descriptors / Indicators: Aching Pain Intervention(s): Monitored during session    Home Living Family/patient expects to be discharged to:: Private residence Living Arrangements: Alone (husband is in rehab)   Type of Home: House Home Access: Stairs to enter   Technical brewer of Steps: 5 Home Layout: One level Home Equipment: Environmental consultant - 2 wheels;Bedside commode Additional Comments: pt husband is currently in rehab since his BKA--he is scheduled to come home on Tuesday ( he is progressing well per pt son)    Prior Function Level of Independence: Independent  Comments: pt reported independence     Hand Dominance        Extremity/Trunk Assessment   Upper Extremity Assessment Upper Extremity Assessment: Defer to OT evaluation;Generalized weakness    Lower Extremity Assessment Lower Extremity Assessment: LLE deficits/detail;RLE deficits/detail RLE Coordination: decreased gross motor LLE  Coordination: decreased gross motor    Cervical / Trunk Assessment Cervical / Trunk Assessment: Kyphotic  Communication   Communication: No difficulties  Cognition Arousal/Alertness: Awake/alert Behavior During Therapy: Flat affect Overall Cognitive Status: Impaired/Different from baseline Area of Impairment: Memory;Following commands;Problem solving;Safety/judgement     Memory: Decreased short-term memory Following Commands: Follows one step commands with increased time;Follows multi-step commands inconsistently (very slow to respond) Safety/Judgement: Decreased awareness of safety;Decreased awareness of deficits   Problem Solving: Slow processing;Decreased initiation;Difficulty sequencing;Requires verbal cues;Requires tactile cues General Comments: pt denies knowledge of taking any medicines erratically and cannot state reason why she is in the hospital; she is perseverating on her "cats" and the care that they need; attempted to speak with pt regarding possible need for SNF and she states she wants to be home with her cats; she demo's diminished insight into her current state of health and mobility deficits     General Comments      Exercises     Assessment/Plan    PT Assessment Patient needs continued PT services  PT Problem List Decreased strength;Decreased activity tolerance;Decreased balance;Decreased mobility;Decreased safety awareness;Decreased cognition;Decreased coordination          PT Treatment Interventions DME instruction;Gait training;Balance training;Functional mobility training;Therapeutic activities;Therapeutic exercise;Patient/family education    PT Goals (Current goals can be found in the Care Plan section)  Acute Rehab PT Goals Patient Stated Goal: pt states she wants to go home PT Goal Formulation: With patient Time For Goal Achievement: 09/29/16 Potential to Achieve Goals: Good    Frequency Min 3X/week   Barriers to discharge         Co-evaluation               End of Session Equipment Utilized During Treatment: Gait belt Activity Tolerance: Patient limited by fatigue Patient left: in chair;with call bell/phone within reach;with chair alarm set;with family/visitor present      Functional Assessment Tool Used: clinical judgement Functional Limitation: Mobility: Walking and moving around Mobility: Walking and Moving Around Current Status [phone]): At least 20 percent but less than 40 percent impaired, limited or restricted Mobility: Walking and Moving Around Goal Status [phone]): At least 1 percent but less than 20 percent impaired, limited or restricted    Time: 1201-1221 PT Time Calculation (min) (ACUTE ONLY): 20 min   Charges:   PT Evaluation $PT Eval Moderate Complexity: 1 Procedure     PT G Codes:   PT G-Codes **NOT FOR INPATIENT CLASS** Functional Assessment Tool Used: clinical judgement Functional Limitation: Mobility: Walking and moving around Mobility: Walking and Moving Around Current Status (H2122): At least 20 percent but less than 40 percent impaired, limited or restricted Mobility: Walking and Moving Around Goal Status [phone]): At least 1 percent but less than 20 percent impaired, limited or restricted    [facility] 09/15/2016, 1:28 PM

## 2016-09-15 NOTE — Progress Notes (Addendum)
PROGRESS NOTE    Kelly [name]   TDV:[id number]  DOB: 05/10/1951  DOA: 09/14/2016 PCP: Horatio Pel, MD   Brief Narrative:  Kelly [name] is a 66 y.o. female with medical history significant of NASH, diverticulosis, depression lower GI bleed, small tubular adenoma of colon, recurrent UTIs, DM brought in by family due to confusion and the suspicion that she has been taking her husband's xanax while he has been at a SNF. Based on further discussion with son, patient and pharmacy tech who has called her pharmacy and counted out her tabs, it appears that she has been taking her medications erratically.   Subjective: Admits to sometimes taking her husbands medications for her anxiety. Currently has no complaints.   Assessment & Plan:   Principal Problem:   Acute encephalopathy- confusion - suspected to be due to benzodiazepine abuse- does not appear confused at this time but memory is terrible - have called son and left a message to give [place] an accurate list of her medications as her med rec states she is on Xanax. Ativan, Trazodone, Zoloft and Ambien. >> Med rec completed- it appears she is on Ambien- last filled Zoloft last year so likely not taking it properly- not taking Trazodone - hold all sedative medications  - ammonial level normal- check B12 and RPR  Active Problems:   Benzodiazepine abuse - see above    Hypertension - resume Losartan/ HCTZ  - she was taking only intermittently at home based on pill count - hold Metoprolol for today    Thrombocytopenia   NASH (nonalcoholic steatohepatitis) - abdominal ultrasound ordered to check for splenomegaly- follow - Addendum: ultrasound showing mild cirrhosis and splenomegaly - no further work up    DM (diabetes mellitus), type 2 - hold Metformin and Amaryl and cont SSI  Lymphocytic Colitis - biopsies done and note on 6/14 by Dr [name] stated to start Budesonide 3 pills a day-  the patient is not sure why she is on these - per  med rec, her pharmacy as no record of mesalamine and therefore she has not recently been on this  Hypothyroid - Synthroid   DVT prophylaxis: Lovenox Code Status: Full code Family Communication: called son- awaiting call back Disposition Plan: home when stable- likely tomorrow Consultants:    Procedures:    Antimicrobials:  Anti-infectives    None       Objective: Vitals:   09/14/16 1855 09/14/16 1900 09/14/16 2129 09/15/16 0512  BP: 159/79 148/72 (!) 158/78 139/72  Pulse: 68 67 69 62  Resp: 24 (!) 28  (!) 26  Temp:   98 F (36.7 C) 98.2 F (36.8 C)  TempSrc:   Oral Oral  SpO2: 96% 94%  97%  Height:   5' 5"  (1.651 m)     Intake/Output Summary (Last 24 hours) at 09/15/16 0954 Last data filed at 09/15/16 0600  Gross per 24 hour  Intake           358.75 ml  Output                0 ml  Net           358.75 ml   There were no vitals filed for this visit.  Examination: General exam: Appears comfortable  HEENT: PERRLA, oral mucosa moist, no sclera icterus or thrush Respiratory system: Clear to auscultation. Respiratory effort normal. Cardiovascular system: S1 & S2 heard, RRR.  No murmurs  Gastrointestinal system: Abdomen soft, non-tender, nondistended. Normal bowel sound. No  organomegaly Central nervous system: Alert and oriented. No focal neurological deficits.-  Extremities: No cyanosis, clubbing or edema Skin: No rashes or ulcers Psychiatry:  Mood & affect appropriate.     Data Reviewed: I have personally reviewed following labs and imaging studies  CBC:  Recent Labs Lab 09/14/16 1622 09/14/16 1636 09/14/16 1934 09/15/16 0442  WBC 6.5  --  6.2 6.2  NEUTROABS 3.3  --   --   --   HGB 11.7* 11.2* 11.4* 11.1*  HCT 34.4* 33.0* 33.6* 32.2*  MCV 81.5  --  81.6 80.9  PLT 78*  --  74* 74*   Basic Metabolic Panel:  Recent Labs Lab 09/14/16 1622 09/14/16 1636 09/15/16 0442  NA 141 143 142  K 3.5 3.5 3.5  CL 107 108 109  CO2 26  --  27  GLUCOSE 93  90 86  BUN 12 10 10   CREATININE 0.86 0.80 0.66  CALCIUM 9.6  --  9.0  MG  --   --  2.0  PHOS  --   --  3.7   GFR: CrCl cannot be calculated (Unknown ideal weight.). Liver Function Tests:  Recent Labs Lab 09/14/16 1622 09/15/16 0442  AST 29 29  ALT 15 15  ALKPHOS 87 78  BILITOT 1.4* 0.8  PROT 7.7 6.8  ALBUMIN 4.0 3.6   No results for input(s): LIPASE, AMYLASE in the last 168 hours.  Recent Labs Lab 09/14/16 1623  AMMONIA 35   Coagulation Profile:  Recent Labs Lab 09/14/16 1934  INR 1.10   Cardiac Enzymes: No results for input(s): CKTOTAL, CKMB, CKMBINDEX, TROPONINI in the last 168 hours. BNP (last 3 results) No results for input(s): PROBNP in the last 8760 hours. HbA1C: No results for input(s): HGBA1C in the last 72 hours. CBG:  Recent Labs Lab 09/14/16 1631 09/15/16 0907  GLUCAP 87 82   Lipid Profile: No results for input(s): CHOL, HDL, LDLCALC, TRIG, CHOLHDL, LDLDIRECT in the last 72 hours. Thyroid Function Tests:  Recent Labs  09/15/16 0442  TSH 1.244   Anemia Panel:  Recent Labs  09/15/16 0838  RETICCTPCT 1.9   Urine analysis:    Component Value Date/Time   COLORURINE AMBER (A) 09/14/2016 1625   APPEARANCEUR HAZY (A) 09/14/2016 1625   LABSPEC 1.020 09/14/2016 1625   PHURINE 5.0 09/14/2016 1625   GLUCOSEU NEGATIVE 09/14/2016 1625   HGBUR NEGATIVE 09/14/2016 1625   BILIRUBINUR NEGATIVE 09/14/2016 1625   KETONESUR NEGATIVE 09/14/2016 1625   PROTEINUR NEGATIVE 09/14/2016 1625   NITRITE NEGATIVE 09/14/2016 1625   LEUKOCYTESUR NEGATIVE 09/14/2016 1625   Sepsis Labs: @LABRCNTIP (procalcitonin:4,lacticidven:4) )No results found for this or any previous visit (from the past 240 hour(s)).       Radiology Studies: Dg Chest 2 View  Result Date: 09/14/2016 CLINICAL DATA:  Urinary tract infection. EXAM: CHEST  2 VIEW COMPARISON:  None. FINDINGS: Normal cardiac silhouette. Chronic bronchitic markings centrally. No focal infiltrate. No  pleural fluid. No acute osseous abnormality. IMPRESSION: Chronic bronchitic markings.  No clear acute findings. Electronically Signed   By: Suzy Bouchard M.D.   On: 09/14/2016 17:35   Ct Head Wo Contrast  Result Date: 09/14/2016 CLINICAL DATA:  Acute onset of altered mental status. Lethargy and depression. Sluggishness and fatigue. Initial encounter. EXAM: CT HEAD WITHOUT CONTRAST TECHNIQUE: Contiguous axial images were obtained from the base of the skull through the vertex without intravenous contrast. COMPARISON:  None. FINDINGS: Brain: No evidence of acute infarction, hemorrhage, hydrocephalus, extra-axial collection or mass lesion/mass effect.  Prominence of the sulci suggests mild cortical volume loss. Mild cerebellar atrophy is noted. The brainstem and fourth ventricle are within normal limits. The basal ganglia are unremarkable in appearance. The cerebral hemispheres demonstrate grossly normal gray-white differentiation. No mass effect or midline shift is seen. Vascular: No hyperdense vessel or unexpected calcification. Skull: There is no evidence of fracture; visualized osseous structures are unremarkable in appearance. Sinuses/Orbits: The orbits are within normal limits. The paranasal sinuses and mastoid air cells are well-aerated. Other: No significant soft tissue abnormalities are seen. IMPRESSION: 1. No acute intracranial pathology seen on CT. 2. Mild cortical volume loss noted. Electronically Signed   By: Garald Balding M.D.   On: 09/14/2016 18:49      Scheduled Meds: . folic acid  1 mg Oral Daily  . insulin aspart  0-5 Units Subcutaneous QHS  . insulin aspart  0-9 Units Subcutaneous TID WC  . levothyroxine  75 mcg Oral QAC breakfast  . multivitamin with minerals  1 tablet Oral Daily  . sodium chloride flush  3 mL Intravenous Q12H  . thiamine  100 mg Oral Daily   Continuous Infusions: . sodium chloride 75 mL/hr at 09/15/16 0257     LOS: 0 days    Time spent in minutes:  67    [place], MD [facility] Pager: [email or web address] Password [facility] 09/15/2016, 9:54 AM

## 2016-09-15 NOTE — Care Management Obs Status (Signed)
[place] NOTIFICATION   Patient Details  Name: Lovetta Newcombe MRN: [id number] Date of Birth: September 25, 1950   Medicare Observation Status Notification Given:  Yes    Erenest Rasher, RN 09/15/2016, 5:14 PM

## 2016-09-15 NOTE — Consult Note (Signed)
[facility] Face-to-Face Psychiatry Consult   Reason for Consult:  confusion Referring Physician:  Dr. Obie Dredge Patient Identification: Kelly [name] MRN:  [id number] Principal Diagnosis: Acute encephalopathy Diagnosis:   Patient Active Problem List   Diagnosis Date Noted  . Thrombocytopenia ([place]) [D69.6] 09/15/2016  . NASH (nonalcoholic steatohepatitis) [K75.81] 09/15/2016  . DM (diabetes mellitus), type 2 ([place]) [E11.9] 09/15/2016  . Acute encephalopathy [G93.40] 09/14/2016  . Benzodiazepine abuse [F13.10] 09/14/2016  . Hypertension [I10] 09/14/2016  . Depression [F32.9] 09/14/2016  . Benzodiazepine overdose [T42.4X1A] 09/14/2016  . NONSPEC ELEVATION OF LEVELS OF TRANSAMINASE/LDH [R74.0] 02/28/2009  . DIVERTICULITIS OF COLON [K57.32] 06/14/2008  . Irritable bowel syndrome [K58.9] 04/06/2008    Total Time spent with patient: 30 minutes   Subjective:   Kelly [name] is a 66 y.o. female patient admitted with confusion  HPI:  Patient is 66 year old female. Admitted due to worsening confusion, decreased PO intake, lethargy.  Patient is limited historian at this time, and although cooperative, remains confused and unable to provide much information. As per chart notes, patient's husband currently in a NH, and she has been at home alone. Family was concerned and had to break her door down, as she had not been answering phone calls. Family has expressed  concer patient has been taking her medications erratically , and [place] have been abusing BZDs ( Xanax- which ws prescribed to husband)  prior to her admission. Of note, admission UDS is positive of BZDs, BAL is < 5.  I have spoken with RN taking care of patient , report is she continues to be confused, but not presenting with agitation or severe restlessness/ anxiety.  Patient is confused , disoriented, but relatively attentive, and able to answer pertinent questions- she denies any recent depression, and denies feeling depressed at this time. She does  report she has been significantly anxious, and states she has been dealing with significant anxiety for " a long time", although is currently  unable to describe anxiety further or  Triggering factors . She denies any suicidal ideations, and describes protective  religious beliefs  " would never kill myself, I want to go to heaven". She acknowledges she is confused and states she [place] have been taking medications " wrong". Of note, she stated at beginning of session she was in a " jewelry store", but responded partially to re-orientation.     Past Psychiatric History: reports history of anxiety, minimizes/denies history of depression, denies history of suicide attempts, denies history of alcohol or drug abuse   Risk to Self: Is patient at risk for suicide?: No Risk to Others:   Prior Inpatient Therapy:   Prior Outpatient Therapy:    Past Medical History:  Past Medical History:  Diagnosis Date  . Adenomatous polyp   . Anxiety   . Arrhythmia   . Diabetes ([place])   . Diverticulosis   . Fatty liver   . Hyperlipidemia   . Hypertension   . NASH (nonalcoholic steatohepatitis)   . Obesity   . SOB (shortness of breath)   . Thrombocytopenia ([place])   . Thyroid disease   . Vitamin D deficiency     Past Surgical History:  Procedure Laterality Date  . oopherectomy     Family History:  Family History  Problem Relation Age of Onset  . Lung cancer Father   . Heart disease Father   . Alzheimer's disease Mother    Family Psychiatric  History: non contributory  Social History:  History  Alcohol Use No  History  Drug Use No    Social History   Social History  . Marital status: Married    Spouse name: N/A  . Number of children: N/A  . Years of education: N/A   Social History Main Topics  . Smoking status: Current Every Day Smoker  . Smokeless tobacco: Never Used  . Alcohol use No  . Drug use: No  . Sexual activity: Not Asked   Other Topics Concern  . None   Social History  Narrative  . None   Additional Social History:    Allergies:   Allergies  Allergen Reactions  . Celecoxib   . Prednisone Itching    Heart rate increased  . Macrodantin [Nitrofurantoin Macrocrystal] Rash  . Penicillins Hives    Per family    Labs:  Results for orders placed or performed during the hospital encounter of 09/14/16 (from the past 48 hour(s))  CBC WITH DIFFERENTIAL     Status: Abnormal   Collection Time: 09/14/16  4:22 PM  Result Value Ref Range   WBC 6.5 4.0 - 10.5 K/uL   RBC 4.22 3.87 - 5.11 MIL/uL   Hemoglobin 11.7 (L) 12.0 - 15.0 g/dL   HCT 34.4 (L) 36.0 - 46.0 %   MCV 81.5 78.0 - 100.0 fL   MCH 27.7 26.0 - 34.0 pg   MCHC 34.0 30.0 - 36.0 g/dL   RDW 15.2 11.5 - 15.5 %   Platelets 78 (L) 150 - 400 K/uL    Comment: SPECIMEN CHECKED FOR CLOTS REPEATED TO VERIFY PLATELET COUNT CONFIRMED BY SMEAR    Neutrophils Relative % 51 %   Neutro Abs 3.3 1.7 - 7.7 K/uL   Lymphocytes Relative 36 %   Lymphs Abs 2.4 0.7 - 4.0 K/uL   Monocytes Relative 11 %   Monocytes Absolute 0.7 0.1 - 1.0 K/uL   Eosinophils Relative 2 %   Eosinophils Absolute 0.1 0.0 - 0.7 K/uL   Basophils Relative 0 %   Basophils Absolute 0.0 0.0 - 0.1 K/uL  Comprehensive metabolic panel     Status: Abnormal   Collection Time: 09/14/16  4:22 PM  Result Value Ref Range   Sodium 141 135 - 145 mmol/L   Potassium 3.5 3.5 - 5.1 mmol/L   Chloride 107 101 - 111 mmol/L   CO2 26 22 - 32 mmol/L   Glucose, Bld 93 65 - 99 mg/dL   BUN 12 6 - 20 mg/dL   Creatinine, Ser 0.86 0.44 - 1.00 mg/dL   Calcium 9.6 8.9 - 10.3 mg/dL   Total Protein 7.7 6.5 - 8.1 g/dL   Albumin 4.0 3.5 - 5.0 g/dL   AST 29 15 - 41 U/L   ALT 15 14 - 54 U/L   Alkaline Phosphatase 87 38 - 126 U/L   Total Bilirubin 1.4 (H) 0.3 - 1.2 mg/dL   GFR calc non Af Amer >60 >60 mL/min   GFR calc Af Amer >60 >60 mL/min    Comment: (NOTE) The eGFR has been calculated using the CKD EPI equation. This calculation has not been validated in all  clinical situations. eGFR's persistently <60 mL/min signify possible Chronic Kidney Disease.    Anion gap 8 5 - 15  Ammonia     Status: None   Collection Time: 09/14/16  4:23 PM  Result Value Ref Range   Ammonia 35 9 - 35 umol/L  Urine rapid drug screen (hosp performed)not at [facility]     Status: Abnormal   Collection Time: 09/14/16  4:25 PM  Result Value Ref Range   Opiates NONE DETECTED NONE DETECTED   Cocaine NONE DETECTED NONE DETECTED   Benzodiazepines POSITIVE (A) NONE DETECTED   Amphetamines NONE DETECTED NONE DETECTED   Tetrahydrocannabinol NONE DETECTED NONE DETECTED   Barbiturates NONE DETECTED NONE DETECTED    Comment:        DRUG SCREEN FOR MEDICAL PURPOSES ONLY.  IF CONFIRMATION IS NEEDED FOR ANY PURPOSE, NOTIFY LAB WITHIN 5 DAYS.        LOWEST DETECTABLE LIMITS FOR URINE DRUG SCREEN Drug Class       Cutoff (ng/mL) Amphetamine      1000 Barbiturate      200 Benzodiazepine   163 Tricyclics       846 Opiates          300 Cocaine          300 THC              50   Ethanol     Status: None   Collection Time: 09/14/16  4:25 PM  Result Value Ref Range   Alcohol, Ethyl (B) <5 <5 mg/dL    Comment:        LOWEST DETECTABLE LIMIT FOR SERUM ALCOHOL IS 5 mg/dL FOR MEDICAL PURPOSES ONLY   Urinalysis, Routine w reflex microscopic     Status: Abnormal   Collection Time: 09/14/16  4:25 PM  Result Value Ref Range   Color, Urine AMBER (A) YELLOW    Comment: BIOCHEMICALS [place] BE AFFECTED BY COLOR   APPearance HAZY (A) CLEAR   Specific Gravity, Urine 1.020 1.005 - 1.030   pH 5.0 5.0 - 8.0   Glucose, UA NEGATIVE NEGATIVE mg/dL   Hgb urine dipstick NEGATIVE NEGATIVE   Bilirubin Urine NEGATIVE NEGATIVE   Ketones, ur NEGATIVE NEGATIVE mg/dL   Protein, ur NEGATIVE NEGATIVE mg/dL   Nitrite NEGATIVE NEGATIVE   Leukocytes, UA NEGATIVE NEGATIVE  Acetaminophen level     Status: Abnormal   Collection Time: 09/14/16  4:30 PM  Result Value Ref Range   Acetaminophen (Tylenol),  Serum <10 (L) 10 - 30 ug/mL    Comment:        THERAPEUTIC CONCENTRATIONS VARY SIGNIFICANTLY. A RANGE OF 10-30 ug/mL Sohm BE AN EFFECTIVE CONCENTRATION FOR MANY PATIENTS. HOWEVER, SOME ARE BEST TREATED AT CONCENTRATIONS OUTSIDE THIS RANGE. ACETAMINOPHEN CONCENTRATIONS >150 ug/mL AT 4 HOURS AFTER INGESTION AND >50 ug/mL AT 12 HOURS AFTER INGESTION ARE OFTEN ASSOCIATED WITH TOXIC REACTIONS.   Salicylate level     Status: None   Collection Time: 09/14/16  4:30 PM  Result Value Ref Range   Salicylate Lvl <6.5 2.8 - 30.0 mg/dL  CBG monitoring, ED     Status: None   Collection Time: 09/14/16  4:31 PM  Result Value Ref Range   Glucose-Capillary 87 65 - 99 mg/dL  I-Stat Chem 8, ED  (not at [facility], [facility])     Status: Abnormal   Collection Time: 09/14/16  4:36 PM  Result Value Ref Range   Sodium 143 135 - 145 mmol/L   Potassium 3.5 3.5 - 5.1 mmol/L   Chloride 108 101 - 111 mmol/L   BUN 10 6 - 20 mg/dL   Creatinine, Ser 0.80 0.44 - 1.00 mg/dL   Glucose, Bld 90 65 - 99 mg/dL   Calcium, Ion 1.17 1.15 - 1.40 mmol/L   TCO2 25 0 - 100 mmol/L   Hemoglobin 11.2 (L) 12.0 - 15.0 g/dL   HCT 33.0 (L) 36.0 -  46.0 %  Lactate dehydrogenase     Status: None   Collection Time: 09/14/16  7:34 PM  Result Value Ref Range   LDH 139 98 - 192 U/L  Protime-INR     Status: None   Collection Time: 09/14/16  7:34 PM  Result Value Ref Range   Prothrombin Time 14.3 11.4 - 15.2 seconds   INR 1.10   APTT     Status: Abnormal   Collection Time: 09/14/16  7:34 PM  Result Value Ref Range   aPTT 40 (H) 24 - 36 seconds    Comment:        IF BASELINE aPTT IS ELEVATED, SUGGEST PATIENT RISK ASSESSMENT BE USED TO DETERMINE APPROPRIATE ANTICOAGULANT THERAPY.   Technologist smear review     Status: None   Collection Time: 09/14/16  7:34 PM  Result Value Ref Range   Tech Review SMEAR STAINED AND AVAILABLE FOR REVIEW     Comment: SPHEROCYTES LARGE PLATELETS PRESENT   CBC     Status: Abnormal   Collection Time:  09/14/16  7:34 PM  Result Value Ref Range   WBC 6.2 4.0 - 10.5 K/uL   RBC 4.12 3.87 - 5.11 MIL/uL   Hemoglobin 11.4 (L) 12.0 - 15.0 g/dL   HCT 33.6 (L) 36.0 - 46.0 %   MCV 81.6 78.0 - 100.0 fL   MCH 27.7 26.0 - 34.0 pg   MCHC 33.9 30.0 - 36.0 g/dL   RDW 15.2 11.5 - 15.5 %   Platelets 74 (L) 150 - 400 K/uL    Comment: SPECIMEN CHECKED FOR CLOTS REPEATED TO VERIFY PLATELET COUNT CONFIRMED BY SMEAR   Vitamin B12     Status: Abnormal   Collection Time: 09/15/16  4:42 AM  Result Value Ref Range   Vitamin B-12 179 (L) 180 - 914 pg/mL    Comment: (NOTE) This assay is not validated for testing neonatal or myeloproliferative syndrome specimens for Vitamin B12 levels. Performed at [facility], [place] [address] [place], [place] [address]   RPR     Status: None   Collection Time: 09/15/16  4:42 AM  Result Value Ref Range   RPR Ser Ql Non Reactive Non Reactive    Comment: (NOTE) Performed At: [facility] [address] [place], [place] [id number] Lindon Romp MD [id number]   HIV antibody (routine testing) (NOT for [facility])     Status: None   Collection Time: 09/15/16  4:42 AM  Result Value Ref Range   HIV Screen 4th Generation wRfx Non Reactive Non Reactive    Comment: (NOTE) Performed At: [facility] [place], [place] [id number] Lindon Romp MD TZ:[id number]   Magnesium     Status: None   Collection Time: 09/15/16  4:42 AM  Result Value Ref Range   Magnesium 2.0 1.7 - 2.4 mg/dL  Phosphorus     Status: None   Collection Time: 09/15/16  4:42 AM  Result Value Ref Range   Phosphorus 3.7 2.5 - 4.6 mg/dL  TSH     Status: None   Collection Time: 09/15/16  4:42 AM  Result Value Ref Range   TSH 1.244 0.350 - 4.500 uIU/mL    Comment: Performed by a 3rd Generation assay with a functional sensitivity of <=0.01 uIU/mL.  Comprehensive metabolic panel     Status: None   Collection Time: 09/15/16  4:42 AM  Result Value Ref Range   Sodium  142 135 - 145 mmol/L   Potassium  3.5 3.5 - 5.1 mmol/L   Chloride 109 101 - 111 mmol/L   CO2 27 22 - 32 mmol/L   Glucose, Bld 86 65 - 99 mg/dL   BUN 10 6 - 20 mg/dL   Creatinine, Ser 0.66 0.44 - 1.00 mg/dL   Calcium 9.0 8.9 - 10.3 mg/dL   Total Protein 6.8 6.5 - 8.1 g/dL   Albumin 3.6 3.5 - 5.0 g/dL   AST 29 15 - 41 U/L   ALT 15 14 - 54 U/L   Alkaline Phosphatase 78 38 - 126 U/L   Total Bilirubin 0.8 0.3 - 1.2 mg/dL   GFR calc non Af Amer >60 >60 mL/min   GFR calc Af Amer >60 >60 mL/min    Comment: (NOTE) The eGFR has been calculated using the CKD EPI equation. This calculation has not been validated in all clinical situations. eGFR's persistently <60 mL/min signify possible Chronic Kidney Disease.    Anion gap 6 5 - 15  CBC     Status: Abnormal   Collection Time: 09/15/16  4:42 AM  Result Value Ref Range   WBC 6.2 4.0 - 10.5 K/uL   RBC 3.98 3.87 - 5.11 MIL/uL   Hemoglobin 11.1 (L) 12.0 - 15.0 g/dL   HCT 32.2 (L) 36.0 - 46.0 %   MCV 80.9 78.0 - 100.0 fL   MCH 27.9 26.0 - 34.0 pg   MCHC 34.5 30.0 - 36.0 g/dL   RDW 14.8 11.5 - 15.5 %   Platelets 74 (L) 150 - 400 K/uL    Comment: CONSISTENT WITH PREVIOUS RESULT  Prealbumin     Status: Abnormal   Collection Time: 09/15/16  4:42 AM  Result Value Ref Range   Prealbumin 10.8 (L) 18 - 38 mg/dL    Comment: Performed at [facility], [place] [address], [place], [place] [address]  Vitamin B12     Status: None   Collection Time: 09/15/16  8:38 AM  Result Value Ref Range   Vitamin B-12 186 180 - 914 pg/mL    Comment: (NOTE) This assay is not validated for testing neonatal or myeloproliferative syndrome specimens for Vitamin B12 levels. Performed at [facility], [place] [address], [place], [place] [address]   Folate     Status: None   Collection Time: 09/15/16  8:38 AM  Result Value Ref Range   Folate 15.6 >5.9 ng/mL    Comment: Performed at [place] [address] [place], [place] [address]  Iron and TIBC      Status: None   Collection Time: 09/15/16  8:38 AM  Result Value Ref Range   Iron 61 28 - 170 ug/dL   TIBC 353 250 - 450 ug/dL   Saturation Ratios 17 10.4 - 31.8 %   UIBC 292 ug/dL    Comment: Performed at [facility], [place] [address], [place], [place] [address]  Ferritin     Status: None   Collection Time: 09/15/16  8:38 AM  Result Value Ref Range   Ferritin 26 11 - 307 ng/mL    Comment: Performed at [facility], [place] [address] [place], [place] [address]  Reticulocytes     Status: None   Collection Time: 09/15/16  8:38 AM  Result Value Ref Range   Retic Ct Pct 1.9 0.4 - 3.1 %   RBC. 3.99 3.87 - 5.11 MIL/uL   Retic Count, Manual 75.8 19.0 - 186.0 K/uL  RPR  Status: None   Collection Time: 09/15/16  8:38 AM  Result Value Ref Range   RPR Ser Ql Non Reactive Non Reactive    Comment: (NOTE) Performed At: [facility] [address] [place], [place] [id number] Lindon Romp MD [id number]   Glucose, capillary     Status: None   Collection Time: 09/15/16  9:07 AM  Result Value Ref Range   Glucose-Capillary 82 65 - 99 mg/dL   Comment 1 Notify RN    Comment 2 Document in Chart     Current Facility-Administered Medications  Medication Dose Route Frequency Provider Last Rate Last Dose  . acetaminophen (TYLENOL) tablet 650 mg  650 mg Oral Q6H PRN Toy Baker, MD       Or  . acetaminophen (TYLENOL) suppository 650 mg  650 mg Rectal Q6H PRN Toy Baker, MD      . folic acid (FOLVITE) tablet 1 mg  1 mg Oral Daily Toy Baker, MD   1 mg at 09/15/16 1004  . losartan (COZAAR) tablet 100 mg  100 mg Oral Daily Debbe Odea, MD       And  . hydrochlorothiazide (MICROZIDE) capsule 12.5 mg  12.5 mg Oral Daily Saima Rizwan, MD      . insulin aspart (novoLOG) injection 0-5 Units  0-5 Units Subcutaneous QHS Anastassia Doutova, MD      . insulin aspart (novoLOG) injection 0-9 Units  0-9 Units Subcutaneous TID WC Toy Baker, MD      .  levothyroxine (SYNTHROID, LEVOTHROID) tablet 75 mcg  75 mcg Oral QAC breakfast Debbe Odea, MD   75 mcg at 09/15/16 1005  . multivitamin with minerals tablet 1 tablet  1 tablet Oral Daily Toy Baker, MD   1 tablet at 09/15/16 1004  . ondansetron (ZOFRAN) tablet 4 mg  4 mg Oral Q6H PRN Toy Baker, MD       Or  . ondansetron (ZOFRAN) injection 4 mg  4 mg Intravenous Q6H PRN Toy Baker, MD      . sodium chloride flush (NS) 0.9 % injection 3 mL  3 mL Intravenous Q12H Toy Baker, MD      . thiamine (VITAMIN B-1) tablet 100 mg  100 mg Oral Daily Toy Baker, MD   100 mg at 09/15/16 1004    Musculoskeletal: Strength & Muscle Tone: within normal limits Gait & Station: gait not examined  Patient leans: N/A  Psychiatric Specialty Exam: Physical Exam  ROS denies headache, denies chest pain, no vomiting, no fever, no chills   Blood pressure (!) 137/57, pulse 60, temperature 98.4 F (36.9 C), temperature source Oral, resp. rate 18, height 5' 5"  (1.651 m), SpO2 98 %.There is no height or weight on file to calculate BMI.  General Appearance: Fairly Groomed  Eye Contact:  Good  Speech:  Slow  Volume:  Decreased  Mood:  denies feeling depressed, states mood is normal   Affect:  blunted  Thought Process:  Disorganized, slowed   Orientation:  Other:  oriented to [facility], disoriented to hospital , oriented to February, disoriented to year, oriented to person  Thought Content:  denies hallucinations, does not appear internally preoccupied   Suicidal Thoughts:  No denies any suicidal or self injurious ideations, denies any violent or homicidal ideations  Homicidal Thoughts:  No  Memory:  recent and remote fair   Judgement:  Impaired  Insight:  Lacking  Psychomotor Activity:  Decreased of note, no current tremors, psychomotor restlessness or diaphoresis noted   Concentration:  Concentration: Fair and Attention Span: Fair  Recall:  Poor  Fund of Knowledge:  Poor   Language:  Fair  Akathisia:  Negative  Handed:  Right  AIMS (if indicated):     Assets:  Desire for Improvement Physical Health  ADL's:  Impaired  Cognition:  Impaired,  Moderate  Sleep:      Assessment - current presentation consistent with delirium , etiology unclear but medication /substance induced being suspected . Patient not currently presenting with symptoms of acute BZD withdrawal, denies depression or suicidal ideations, and it does not appear likely , based on current presentation and information available, that medication misuse, abuse was suicidal in intent. She does endorse history of anxiety, which she describes as chronic.  Treatment Plan Summary   Disposition: No evidence of imminent risk to self or others at present.   Patient does not meet criteria for psychiatric inpatient admission.   Consider starting ATIVAN PRNs for possible BZD withdrawal , based on suspicion of recent BZD misuse. Consider low dose Seroquel such as 12.5 mgrs Q 8 hours PRN for management of delirium . Would consider SSRI for management of anxiety , which she describes as chronic, but would  defer starting it   until patient further stabilized and  cognitively improved.     Neita Garnet, MD 09/15/2016 3:58 PM

## 2016-09-16 DIAGNOSIS — K7581 Nonalcoholic steatohepatitis (NASH): Secondary | ICD-10-CM | POA: Diagnosis not present

## 2016-09-16 DIAGNOSIS — E119 Type 2 diabetes mellitus without complications: Secondary | ICD-10-CM | POA: Diagnosis not present

## 2016-09-16 DIAGNOSIS — F0390 Unspecified dementia without behavioral disturbance: Secondary | ICD-10-CM

## 2016-09-16 DIAGNOSIS — G9340 Encephalopathy, unspecified: Secondary | ICD-10-CM | POA: Diagnosis not present

## 2016-09-16 DIAGNOSIS — R161 Splenomegaly, not elsewhere classified: Secondary | ICD-10-CM

## 2016-09-16 DIAGNOSIS — I10 Essential (primary) hypertension: Secondary | ICD-10-CM

## 2016-09-16 DIAGNOSIS — K7460 Unspecified cirrhosis of liver: Secondary | ICD-10-CM

## 2016-09-16 DIAGNOSIS — F1310 Sedative, hypnotic or anxiolytic abuse, uncomplicated: Secondary | ICD-10-CM | POA: Diagnosis not present

## 2016-09-16 DIAGNOSIS — E538 Deficiency of other specified B group vitamins: Secondary | ICD-10-CM

## 2016-09-16 DIAGNOSIS — T424X1A Poisoning by benzodiazepines, accidental (unintentional), initial encounter: Secondary | ICD-10-CM

## 2016-09-16 HISTORY — DX: Unspecified cirrhosis of liver: K74.60

## 2016-09-16 HISTORY — DX: Splenomegaly, not elsewhere classified: R16.1

## 2016-09-16 HISTORY — DX: Unspecified dementia, unspecified severity, without behavioral disturbance, psychotic disturbance, mood disturbance, and anxiety: F03.90

## 2016-09-16 LAB — HEMOGLOBIN A1C
Hgb A1c MFr Bld: 5.4 % (ref 4.8–5.6)
Mean Plasma Glucose: 108 mg/dL

## 2016-09-16 LAB — GLUCOSE, CAPILLARY
Glucose-Capillary: 90 mg/dL (ref 65–99)
Glucose-Capillary: 138 mg/dL — ABNORMAL HIGH (ref 65–99)

## 2016-09-16 LAB — FOLATE RBC
FOLATE, HEMOLYSATE: 339.6 ng/mL
FOLATE, RBC: 1048 ng/mL (ref >498)
HEMATOCRIT: 32.4 % — AB (ref 34.0–46.6)

## 2016-09-16 MED ORDER — CYANOCOBALAMIN 1000 MCG/ML IJ SOLN
1000.0000 ug | Freq: Once | INTRAMUSCULAR | Status: AC
  Administered 2016-09-16: 1000 ug via SUBCUTANEOUS
  Filled 2016-09-16: qty 1

## 2016-09-16 MED ORDER — VITAMIN B-12 1000 MCG PO TABS: 1000.0000 ug | ORAL_TABLET | Freq: Every day | ORAL | 0 refills | Status: DC

## 2016-09-16 MED ORDER — ZOLPIDEM TARTRATE 5 MG PO TABS: 5.0000 mg | ORAL_TABLET | Freq: Every evening | ORAL | 0 refills | Status: DC | PRN

## 2016-09-16 NOTE — Clinical Social Work Note (Signed)
Per MD, patient does not require/qualify for SNF placement. PT currently recommending HH. Patient's son, [place] notified and agreeable to patient's return home.  Medical Social Worker will sign off for now as social work intervention is no longer needed. Please consult [place] again if new need arises.  Glendon Axe, MSW [phone] 09/16/2016 1:33 PM

## 2016-09-16 NOTE — Evaluation (Signed)
Occupational Therapy Evaluation Patient Details Name: Kelly [name] MRN: [id number] DOB: 10/04/1950 Today's Date: 09/16/2016    History of Present Illness Kelly [name] is a 66 y.o. female with medical history significant of NASH, diverticulosis, depression lower GI bleed, small tubular adenoma of colon, recurrent UTIs, DM; pt  brought in by family due to confusion and the suspicion that she has been taking her husband's xanax while he has been at a SNF   Clinical Impression   Pt admitted with confusion. Pt currently with functional limitations due to the deficits listed below (see OT Problem List). Pt will benefit from skilled OT to increase their safety and independence with ADL and functional mobility for ADL to facilitate discharge to venue listed below.      Follow Up Recommendations  Home health OT;Supervision/Assistance - 24 hour    Equipment Recommendations  Tub/shower bench       Precautions / Restrictions Precautions Precautions: Fall Precaution Comments: 2 falls in past month, none prior (husband has been in SNF for past month, falls likely due to Benzodiazepine overdose/ abuse ) Restrictions Weight Bearing Restrictions: No      Mobility Bed Mobility Overal bed mobility: Needs Assistance Bed Mobility: Supine to Sit     Supine to sit: Modified independent (Device/Increase time);HOB elevated     General bed mobility comments: pt in chair  Transfers Overall transfer level: Needs assistance Equipment used: Rolling walker (2 wheeled) Transfers: Stand Pivot Transfers;Sit to/from Stand Sit to Stand: Supervision Stand pivot transfers: Supervision       General transfer comment: VC for safety    Balance Overall balance assessment: History of Falls Sitting-balance support: No upper extremity supported;Feet supported Sitting balance-Leahy Scale: Good       Standing balance-Leahy Scale: Good Standing balance comment: able to reach forward over BOS, can turn 360*  without LOB, can stand with feet together 10 sec without LOB, much improved from yesterday                            ADL Overall ADL's : Needs assistance/impaired                             Toileting- Clothing Manipulation and Hygiene: Supervision/safety;Sit to/from stand;Cueing for safety;Cueing for sequencing   Tub/ Shower Transfer: Tub Buyer, retail Details (indicate cue type and reason): verbalized safety for tub bench. Pt has fallen in the tub and is scared to step over   General ADL Comments: Pt overall S with ADL activity. Pt demonstrated decreased processing during OT eval and needed Vc for simple fxal activity . son aware and observed. reccomend 24/7 A at home as well as OT for this pt .               Pertinent Vitals/Pain Pain Assessment: No/denies pain Pain Score: 0-No pain        Extremity/Trunk Assessment Upper Extremity Assessment Upper Extremity Assessment: Generalized weakness           Communication Communication Communication: No difficulties   Cognition Arousal/Alertness: Awake/alert Behavior During Therapy: WFL for tasks assessed/performed Overall Cognitive Status: History of cognitive impairments - at baseline Area of Impairment: Memory     Memory: Decreased short-term memory Following Commands:  (very slow to respond) Safety/Judgement: Decreased awareness of safety   Problem Solving: Slow processing;Difficulty sequencing;Requires verbal cues General Comments: pt unable to figure out how to reach  back for chair with out hands on [place] expects to be discharged to:: Private residence Living Arrangements: Alone   Type of Home: House Home Access: Stairs to enter [employer] of Steps: [place]: One level     Bathroom Shower/Tub: Teacher, early years/pre: [place]: Environmental consultant - 2 wheels;Bedside  commode   Additional Comments: pt husband is currently in rehab since his BKA--he is scheduled to come home on Tuesday ( he is progressing well per pt son)      Prior Functioning/Environment Level of Independence: Independent        Comments: pt reported independence        OT Problem List: Decreased strength;Decreased activity tolerance;Decreased cognition;Impaired balance (sitting and/or standing)   OT Treatment/Interventions: Self-care/ADL training;DME and/or AE instruction;Patient/family education    OT Goals(Current goals can be found in the care plan section) Acute Rehab OT Goals Patient Stated Goal: pt states she wants to go home OT Goal Formulation: With patient Time For Goal Achievement: 09/30/16 Potential to Achieve Goals: Good  OT Frequency: Min 2X/week   Barriers to D/C:               End of Session Nurse Communication: Mobility status  Activity Tolerance: Patient tolerated treatment well Patient left: in chair   Time: 0630-1601 OT Time Calculation (min): 30 min Charges:  OT General Charges $OT Visit: 1 Procedure OT Evaluation $OT Eval Moderate Complexity: 1 Procedure OT Treatments $Self Care/Home Management : 8-22 mins G-Codes:    Payton Mccallum D September 27, 2016, 3:07 PM

## 2016-09-16 NOTE — NC FL2 (Signed)
[place] LEVEL OF CARE SCREENING TOOL     IDENTIFICATION  Patient Name: Kelly [name] Birthdate: [birth year] Sex: female Admission Date (Current Location): 09/14/2016  [facility] and [place] Number:  Herbalist and Address:  [facility],  [place] [address], [place]      Provider Number: [phone]  Attending Physician Name and Address:  Debbe Odea, MD  Relative Name and Phone Number:       Current Level of Care: Hospital Recommended Level of Care: [place] Prior Approval Number:    Date Approved/Denied:   PASRR Number:   [id number]   Discharge Plan: SNF    Current Diagnoses: Patient Active Problem List   Diagnosis Date Noted  . Vitamin B 12 deficiency 09/16/2016  . Dementia 09/16/2016  . Splenomegaly 09/16/2016  . Liver cirrhosis secondary to NASH ([place]) 09/16/2016  . Thrombocytopenia ([place]) 09/15/2016  . DM (diabetes mellitus), type 2 ([place]) 09/15/2016  . Acute encephalopathy 09/14/2016  . Benzodiazepine abuse 09/14/2016  . Hypertension 09/14/2016  . Depression 09/14/2016  . Benzodiazepine overdose 09/14/2016  . NONSPEC ELEVATION OF LEVELS OF TRANSAMINASE/LDH 02/28/2009  . DIVERTICULITIS OF COLON 06/14/2008  . Irritable bowel syndrome 04/06/2008    Orientation RESPIRATION BLADDER Height & Weight     Self, Place  Normal Continent Weight:   Height:  5' 5"  (165.1 cm)  BEHAVIORAL SYMPTOMS/MOOD NEUROLOGICAL BOWEL NUTRITION STATUS   (none )  (none) Continent Diet (carb modifed)  AMBULATORY STATUS COMMUNICATION OF NEEDS Skin   Limited Assist Verbally Normal                       Personal Care Assistance Level of Assistance  Dressing, Bathing, Feeding Bathing Assistance: Limited assistance Feeding assistance: Independent Dressing Assistance: Limited assistance     Functional Limitations Info  Sight, Hearing, Speech Sight Info: Adequate Hearing Info: Adequate Speech Info: Adequate    SPECIAL  CARE FACTORS FREQUENCY  PT (By licensed PT)     PT Frequency: 3              Contractures      Additional Factors Info  Code Status, Allergies Code Status Info: FULL CODE Allergies Info:  Celecoxib, Prednisone, Macrodantin Nitrofurantoin Macrocrystal, Penicillins           Current Medications (09/16/2016):  This is the current hospital active medication list Current Facility-Administered Medications  Medication Dose Route Frequency Provider Last Rate Last Dose  . acetaminophen (TYLENOL) tablet 650 mg  650 mg Oral Q6H PRN Toy Baker, MD       Or  . acetaminophen (TYLENOL) suppository 650 mg  650 mg Rectal Q6H PRN Toy Baker, MD      . cyanocobalamin ((VITAMIN B-12)) injection 1,000 mcg  1,000 mcg Subcutaneous Once Debbe Odea, MD      . folic acid (FOLVITE) tablet 1 mg  1 mg Oral Daily Toy Baker, MD   1 mg at 09/15/16 1004  . losartan (COZAAR) tablet 100 mg  100 mg Oral Daily Debbe Odea, MD   100 mg at 09/15/16 1702   And  . hydrochlorothiazide (MICROZIDE) capsule 12.5 mg  12.5 mg Oral Daily Debbe Odea, MD   12.5 mg at 09/15/16 1702  . insulin aspart (novoLOG) injection 0-5 Units  0-5 Units Subcutaneous QHS Anastassia Doutova, MD      . insulin aspart (novoLOG) injection 0-9 Units  0-9 Units Subcutaneous TID WC Toy Baker, MD      .  levothyroxine (SYNTHROID, LEVOTHROID) tablet 75 mcg  75 mcg Oral QAC breakfast Debbe Odea, MD   75 mcg at 09/15/16 1005  . multivitamin with minerals tablet 1 tablet  1 tablet Oral Daily Toy Baker, MD   1 tablet at 09/15/16 1004  . ondansetron (ZOFRAN) tablet 4 mg  4 mg Oral Q6H PRN Toy Baker, MD       Or  . ondansetron (ZOFRAN) injection 4 mg  4 mg Intravenous Q6H PRN Toy Baker, MD      . sodium chloride flush (NS) 0.9 % injection 3 mL  3 mL Intravenous Q12H Toy Baker, MD   3 mL at 09/15/16 2158  . thiamine (VITAMIN B-1) tablet 100 mg  100 mg Oral Daily Toy Baker,  MD   100 mg at 09/15/16 1004     Discharge Medications: Please see discharge summary for a list of discharge medications.  Relevant Imaging Results:  Relevant Lab Results:   Additional Information SSN [id number]  Glendon Axe A

## 2016-09-16 NOTE — Clinical Social Work Note (Signed)
Per MD, patient does not require/qualify for SNF placement. Patient's son, [place] notified and agreeable to patient's return home.  Medical Social Worker will sign off for now as social work intervention is no longer needed. Please consult [place] again if new need arises.  Glendon Axe, MSW [phone] 09/16/2016 10:47 AM

## 2016-09-16 NOTE — Clinical Social Work Placement (Signed)
   CLINICAL SOCIAL WORK PLACEMENT  NOTE  Date:  09/16/2016  Patient Details  Name: Kelly [name] MRN: [id number] Date of Birth: 1951/07/11  Clinical Social Work is seeking post-discharge placement for this patient at the [place] level of care (*CSW will initial, date and re-position this form in  chart as items are completed):  Yes   Patient/family provided with [place] Work Department's list of facilities offering this level of care within the geographic area requested by the patient (or if unable, by the patient's family).  Yes   Patient/family informed of their freedom to choose among providers that offer the needed level of care, that participate in Medicare, Medicaid or managed care program needed by the patient, have an available bed and are willing to accept the patient.  Yes   Patient/family informed of [facility]'s ownership interest in [facility] and [facility], as well as of the fact that they are under no obligation to receive care at these facilities.  PASRR submitted to EDS on 09/16/16     PASRR number received on 09/16/16     Existing PASRR number confirmed on       FL2 transmitted to all facilities in geographic area requested by pt/family on 09/16/16     FL2 transmitted to all facilities within larger geographic area on       Patient informed that his/her managed care company has contracts with or will negotiate with certain facilities, including the following:            Patient/family informed of bed offers received.  Patient chooses bed at       Physician recommends and patient chooses bed at      Patient to be transferred to   on  .  Patient to be transferred to facility by       Patient family notified on   of transfer.  Name of family member notified:        PHYSICIAN Please prepare priority discharge summary, including medications, Please sign FL2     Additional Comment:     _______________________________________________ Glendon Axe A 09/16/2016, 12:35 PM

## 2016-09-16 NOTE — Progress Notes (Addendum)
Physical Therapy Treatment Patient Details Name: Mckensi Jorge MRN: [id number] DOB: [birth year] Today's Date: 09/16/2016    History of Present Illness Ailie Hundal is a 66 y.o. female with medical history significant of NASH, diverticulosis, depression lower GI bleed, small tubular adenoma of colon, recurrent UTIs, DM; pt  brought in by family due to confusion and the suspicion that she has been taking her husband's xanax while he has been at a SNF    PT Comments    Significant progress with mobility today, pt ambulated 120' with RW with supervision, no loss of balance. PT now recommending HHPT, supervision for mobility and RW. Pt has h/o 2 falls in past 1 month, her husband has been at SNF due to LE amputation for past month and pt reports being lonely and anxious during his absence. Falls are likely related to benzodiazepine overdose/ abuse during his absence. Pt/son encouraged to return to MD if falls persist.   Follow Up Recommendations  Home health PT;Supervision for mobility/OOB     Equipment Recommendations  Rolling walker with 5" wheels    Recommendations for Other Services       Precautions / Restrictions Precautions Precautions: Fall Precaution Comments: 2 falls in past month, none prior (husband has been in SNF for past month, falls likely due to Benzodiazepine overdose/ abuse ) Restrictions Weight Bearing Restrictions: No    Mobility  Bed Mobility Overal bed mobility: Needs Assistance Bed Mobility: Supine to Sit     Supine to sit: Modified independent (Device/Increase time);HOB elevated        Transfers Overall transfer level: Needs assistance Equipment used: Rolling walker (2 wheeled) Transfers: Sit to/from Stand Sit to Stand: Supervision;Min guard         General transfer comment: supervision for safety, no physical assist, pt unsteady initially but demonstrated good awareness of this and sat back down, no LOB with second attempt at sit to  stand  Ambulation/Gait Ambulation/Gait assistance: Supervision Ambulation Distance (Feet): 120 Feet Assistive device: Rolling walker (2 wheeled) Gait Pattern/deviations: Step-through pattern   Gait velocity interpretation: Below normal speed for age/gender General Gait Details: steady with RW, no LOB, decr velocity, pt able to manage obstacles and 180* turn with RW   Stairs            Wheelchair Mobility    Modified Rankin (Stroke Patients Only)       Balance Overall balance assessment: History of Falls Sitting-balance support: No upper extremity supported;Feet supported Sitting balance-Leahy Scale: Good       Standing balance-Leahy Scale: Good Standing balance comment: able to reach forward over BOS, can turn 360* without LOB, can stand with feet together 10 sec without LOB, much improved from yesterday                    Cognition Arousal/Alertness: Awake/alert Behavior During Therapy: WFL for tasks assessed/performed Overall Cognitive Status: History of cognitive impairments - at baseline Area of Impairment: Memory     Memory: Decreased short-term memory Following Commands:  (very slow to respond)       General Comments: improved cognition today, pt able to follow commands, manage RW without difficulty, son provided details of fall hx as pt did not fully recall details    Exercises      General Comments        Pertinent Vitals/Pain Pain Assessment: No/denies pain Pain Score: 0-No pain    Home Living  Prior Function            PT Goals (current goals can now be found in the care plan section) Acute Rehab PT Goals Patient Stated Goal: pt states she wants to go home PT Goal Formulation: With patient/family Time For Goal Achievement: 09/29/16 Potential to Achieve Goals: Good Progress towards PT goals: Progressing toward goals    Frequency    Min 3X/week      PT Plan Current plan remains appropriate     Co-evaluation             End of Session Equipment Utilized During Treatment: Gait belt Activity Tolerance: Patient limited by fatigue Patient left: in chair;with call bell/phone within reach;with chair alarm set;with family/visitor present     Time: 1232-1303 PT Time Calculation (min) (ACUTE ONLY): 31 min  Charges:  $Gait Training: 8-22 mins $Therapeutic Activity: 8-22 mins                    G Codes:     Philomena Doheny 09/16/2016, 1:18 PM [phone]

## 2016-09-16 NOTE — Discharge Summary (Signed)
Physician Discharge Summary  Kelly [name] MVH:[id number] DOB: Aug 25, 1950 DOA: 09/14/2016  PCP: Horatio Pel, MD  Admit date: 09/14/2016 Discharge date: 09/16/2016  Admitted From:home Disposition:  home   Recommendations for Outpatient Follow-up:  1. F/u on dementia   2. F/u on Cirrhosis  3. F/u on B12 deficiency  Home Health:  None needed  Equipment/Devices:  none    Discharge Condition:  stable   CODE STATUS:  Full code   Diet recommendation:  Heart healthy Consultations:   psych    Discharge Diagnoses:  Principal Problem:   Acute encephalopathy Active Problems:   Benzodiazepine overdose/ abuse   Vitamin B 12 deficiency   Dementia   Hypertension   Thrombocytopenia (HCC)   DM (diabetes mellitus), type 2 (HCC)   Splenomegaly   Liver cirrhosis secondary to NASH (HCC)    Subjective: No complaints other than wanting to walk more.   Brief Summary: Kelly [name] a 66 y.o.femalewith medical history significant of NASH, diverticulosis, depression lower GI bleed, small tubular adenoma of colon, recurrent UTIs, DM brought in by family due to confusion and the suspicion that she has been taking her husband's xanax while he has been at a SNF. Based on further discussion with son, patient and pharmacy tech who has called her pharmacy and counted out her tabs, it appears that she has been taking her medications erratically.   Hospital Course:  Principal Problem:   Acute encephalopathy with confusion- B12 deficiency- dementia - suspected to be due to benzodiazepine abuse and has resolved  - per family, she is back to baseline - appears to have severe baseline memory issues and likely has dementia- spoke with sister in law in detail today - patient needs 24 hr monitoring at home and all medications need to be given to her and otherwise kept out of her reach- family needs to be present at every visit with PCP  - PCP should understand to limit the amount of sedatives this  patient received- she last received a 68 day supply of Ambien recently - have called son and left a message to give [place] an accurate list of her medications as her med rec states she is on Xanax. Ativan, Trazodone, Zoloft and Ambien. >> Med rec completed- it appears she is on Ambien- last filled Zoloft last year so likely not taking it properly- not taking Trazodone - hold all sedative medications  - ammonial level normal- checked B12 and found to be low- will start replacing- will give s/c 1000 mch today and then oral B12- recommend close follow up of this level  - RPR was non reactive - psych has evaluated her and did not realize the severity of her dementia- they would like mediations for anxiety and depression-  I do not feel she is depressed and does not need any additions to her current medications which will only cause polypharmacy and serve to debilitate her further- I feel her cognitive issues need to be followed up by per PCP to see if any improvement occurs with B12 replacement and family needs to be more involved in her care  Active Problems:   Benzodiazepine abuse - see above    Hypertension - resume Losartan/ HCTZ  - she was taking only intermittently at home based on pill count - hold Metoprolol for today    Thrombocytopenia   NASH (nonalcoholic steatohepatitis)/ Cirrhosis -  ultrasound showing cirrhosis and splenomegaly - no further inpatient work up- f/u as outpt for development of encepahlopathy, ascites etc.  DM (diabetes mellitus), type 2 - hold Metformin and Amaryl and cont SSI  Lymphocytic Colitis - biopsies done and note on 6/14 by Dr [name] stated to start Budesonide 3 pills a day- - per med rec, her pharmacy as no recent record of mesalamine being prescribed and therefore she has not recently been on this- no GI issues noted while in hospital  Hypothyroid - Synthroid- TSH was with in normal range    Discharge Instructions  Discharge Instructions     Diet - low sodium heart healthy    Complete by:  As directed    Discharge instructions    Complete by:  As directed    - patient needs 24 hr monitoring at home and all medications need to be given to her and otherwise kept out of her reach - family needs to be present at every visit with PCP  - PCP should understand to limit the amount of sedatives this patient receives- she received a 90 day supply of Ambien recently   Increase activity slowly    Complete by:  As directed      Allergies as of 09/16/2016      Reactions   Celecoxib    Prednisone Itching   Heart rate increased   Macrodantin [nitrofurantoin Macrocrystal] Rash   Penicillins Hives   Per family      Medication List    STOP taking these medications   ALPRAZolam 0.25 MG tablet Commonly known as:  XANAX   LORazepam 0.5 MG tablet Commonly known as:  ATIVAN   mesalamine 1000 MG suppository Commonly known as:  CANASA   traZODone 50 MG tablet Commonly known as:  DESYREL     TAKE these medications   glimepiride 1 MG tablet Commonly known as:  AMARYL Take 1 mg by mouth daily with breakfast.   levothyroxine 75 MCG tablet Commonly known as:  SYNTHROID, LEVOTHROID Take 75 mcg by mouth daily.   losartan-hydrochlorothiazide 100-12.5 MG tablet Commonly known as:  HYZAAR Take 1 tablet by mouth daily.   metFORMIN 500 MG 24 hr tablet Commonly known as:  GLUCOPHAGE-XR Take 1,000 mg by mouth 2 (two) times daily.   metoprolol tartrate 25 MG tablet Commonly known as:  LOPRESSOR Take 25 mg by mouth 2 (two) times daily.   pravastatin 40 MG tablet Commonly known as:  PRAVACHOL Take 40 mg by mouth daily.   sertraline 50 MG tablet Commonly known as:  ZOLOFT Take 50 mg by mouth daily.   vitamin B-12 1000 MCG tablet Commonly known as:  CYANOCOBALAMIN Take 1 tablet (1,000 mcg total) by mouth daily.   zolpidem 5 MG tablet Commonly known as:  AMBIEN Take 5 mg by mouth at bedtime as needed for sleep.        Allergies  Allergen Reactions  . Celecoxib   . Prednisone Itching    Heart rate increased  . Macrodantin [Nitrofurantoin Macrocrystal] Rash  . Penicillins Hives    Per family     Procedures/Studies:    Dg Chest 2 View  Result Date: 09/14/2016 CLINICAL DATA:  Urinary tract infection. EXAM: CHEST  2 VIEW COMPARISON:  None. FINDINGS: Normal cardiac silhouette. Chronic bronchitic markings centrally. No focal infiltrate. No pleural fluid. No acute osseous abnormality. IMPRESSION: Chronic bronchitic markings.  No clear acute findings. Electronically Signed   By: Suzy Bouchard M.D.   On: 09/14/2016 17:35   Ct Head Wo Contrast  Result Date: 09/14/2016 CLINICAL DATA:  Acute onset of altered mental status. Lethargy and  depression. Sluggishness and fatigue. Initial encounter. EXAM: CT HEAD WITHOUT CONTRAST TECHNIQUE: Contiguous axial images were obtained from the base of the skull through the vertex without intravenous contrast. COMPARISON:  None. FINDINGS: Brain: No evidence of acute infarction, hemorrhage, hydrocephalus, extra-axial collection or mass lesion/mass effect. Prominence of the sulci suggests mild cortical volume loss. Mild cerebellar atrophy is noted. The brainstem and fourth ventricle are within normal limits. The basal ganglia are unremarkable in appearance. The cerebral hemispheres demonstrate grossly normal gray-white differentiation. No mass effect or midline shift is seen. Vascular: No hyperdense vessel or unexpected calcification. Skull: There is no evidence of fracture; visualized osseous structures are unremarkable in appearance. Sinuses/Orbits: The orbits are within normal limits. The paranasal sinuses and mastoid air cells are well-aerated. Other: No significant soft tissue abnormalities are seen. IMPRESSION: 1. No acute intracranial pathology seen on CT. 2. Mild cortical volume loss noted. Electronically Signed   By: Garald Balding M.D.   On: 09/14/2016 18:49   US  Abdomen Complete  Result Date: 09/15/2016 CLINICAL DATA:  Thrombocytopenia, fatty liver, evaluate for organomegaly EXAM: ABDOMEN ULTRASOUND COMPLETE COMPARISON:  03/03/2009 FINDINGS: Gallbladder: No gallstones, gallbladder wall thickening, or pericholecystic fluid. Common bile duct: Diameter: 4 mm Liver: Coarse hepatic echotexture with mildly nodular hepatic contour of the left hepatic lobe (image 32), raising the possibility of mild cirrhosis. No focal hepatic lesion is seen. IVC: No abnormality visualized. Pancreas: Visualized portion unremarkable. Spleen: Enlarged, measuring 12.1 x 15.1 x 7.6 cm (calculated volume 723 mL). Right Kidney: Length: 12.0 cm.  No mass or hydronephrosis. Left Kidney: Length: 11.6 cm.  No mass or hydronephrosis. Abdominal aorta: No aneurysm visualized. Other findings: None. IMPRESSION: Suspected cirrhosis.  No focal hepatic lesion is seen. Splenomegaly (calculated volume 723 mL). Electronically Signed   By: Julian Hy M.D.   On: 09/15/2016 10:11       Discharge Exam: Vitals:   09/15/16 2328 09/16/16 0545  BP: (!) 162/99 (!) 159/78  Pulse: 70 64  Resp: 18   Temp: 97.9 F (36.6 C) 97.9 F (36.6 C)   Vitals:   09/15/16 1500 09/15/16 2033 09/15/16 2328 09/16/16 0545  BP: (!) 137/57 129/73 (!) 162/99 (!) 159/78  Pulse: 60 93 70 64  Resp: 18 18 18    Temp: 98.4 F (36.9 C) 98.7 F (37.1 C) 97.9 F (36.6 C) 97.9 F (36.6 C)  TempSrc: Oral Oral Oral Oral  SpO2: 98% 100% 100% 96%  Height:        General: Pt is alert, awake, not in acute distress Cardiovascular: RRR, S1/S2 +, no rubs, no gallops Respiratory: CTA bilaterally, no wheezing, no rhonchi Abdominal: Soft, NT, ND, bowel sounds + Extremities: no edema, no cyanosis    The results of significant diagnostics from this hospitalization (including imaging, microbiology, ancillary and laboratory) are listed below for reference.     Microbiology: No results found for this or any previous visit  (from the past 240 hour(s)).   Labs: BNP (last 3 results) No results for input(s): BNP in the last 8760 hours. Basic Metabolic Panel:  Recent Labs Lab 09/14/16 1622 09/14/16 1636 09/15/16 0442  NA 141 143 142  K 3.5 3.5 3.5  CL 107 108 109  CO2 26  --  27  GLUCOSE 93 90 86  BUN 12 10 10   CREATININE 0.86 0.80 0.66  CALCIUM 9.6  --  9.0  MG  --   --  2.0  PHOS  --   --  3.7   Liver  Function Tests:  Recent Labs Lab 09/14/16 1622 09/15/16 0442  AST 29 29  ALT 15 15  ALKPHOS 87 78  BILITOT 1.4* 0.8  PROT 7.7 6.8  ALBUMIN 4.0 3.6   No results for input(s): LIPASE, AMYLASE in the last 168 hours.  Recent Labs Lab 09/14/16 1623  AMMONIA 35   CBC:  Recent Labs Lab 09/14/16 1622 09/14/16 1636 09/14/16 1934 09/15/16 0442  WBC 6.5  --  6.2 6.2  NEUTROABS 3.3  --   --   --   HGB 11.7* 11.2* 11.4* 11.1*  HCT 34.4* 33.0* 33.6* 32.2*  MCV 81.5  --  81.6 80.9  PLT 78*  --  74* 74*   Cardiac Enzymes: No results for input(s): CKTOTAL, CKMB, CKMBINDEX, TROPONINI in the last 168 hours. BNP: Invalid input(s): POCBNP CBG:  Recent Labs Lab 09/14/16 1631 09/15/16 0907 09/15/16 1831 09/15/16 2153 09/16/16 0730  GLUCAP 87 82 93 78 90   D-Dimer No results for input(s): DDIMER in the last 72 hours. Hgb A1c  Recent Labs  09/15/16 0442  HGBA1C 5.4   Lipid Profile No results for input(s): CHOL, HDL, LDLCALC, TRIG, CHOLHDL, LDLDIRECT in the last 72 hours. Thyroid function studies  Recent Labs  09/15/16 0442  TSH 1.244   Anemia work up  Recent Labs  09/15/16 0442 09/15/16 0838  VITAMINB12 179* 186  FOLATE  --  15.6  FERRITIN  --  26  TIBC  --  353  IRON  --  61  RETICCTPCT  --  1.9   Urinalysis    Component Value Date/Time   COLORURINE AMBER (A) 09/14/2016 1625   APPEARANCEUR HAZY (A) 09/14/2016 1625   LABSPEC 1.020 09/14/2016 1625   PHURINE 5.0 09/14/2016 1625   GLUCOSEU NEGATIVE 09/14/2016 1625   HGBUR NEGATIVE 09/14/2016 1625    BILIRUBINUR NEGATIVE 09/14/2016 1625   KETONESUR NEGATIVE 09/14/2016 1625   PROTEINUR NEGATIVE 09/14/2016 1625   NITRITE NEGATIVE 09/14/2016 1625   LEUKOCYTESUR NEGATIVE 09/14/2016 1625   Sepsis Labs Invalid input(s): PROCALCITONIN,  WBC,  LACTICIDVEN Microbiology No results found for this or any previous visit (from the past 240 hour(s)).   Time coordinating discharge: Over 30 minutes  SIGNED:   Debbe Odea, MD  [facility] 09/16/2016, 10:16 AM Pager   If 7PM-7AM, please contact night-coverage [email or web address] Password [email or web address]

## 2016-09-16 NOTE — Progress Notes (Signed)
Spoke with pt, her son and sister in law at bedside concerning discharge plans.  Pt will discharge home with [place] and DME, RW.

## 2016-09-16 NOTE — Care Management Note (Signed)
Case Management Note  Patient Details  Name: Pearlie Muilenburg MRN: [id number] Date of Birth: 10-16-1950  Subjective/Objective:                    Action/Plan:   Expected Discharge Date:  09/16/16               Expected Discharge Plan:  [place]  In-House Referral:  Clinical Social Work  Discharge planning Services  CM Consult  Post Acute Care Choice:    Choice offered to:  Adult Children  DME Arranged:  [facility] DME Agency:  [place] Arranged:  RN, PT, Nurse's Aide, Social Work [employer] Agency:  [facility]  Status of Service:  Completed, signed off  If discussed at [employer], dates discussed:    Additional CommentsPurcell Mouton, RN 09/16/2016, 1:04 PM

## 2016-09-17 DIAGNOSIS — G9340 Encephalopathy, unspecified: Secondary | ICD-10-CM | POA: Diagnosis not present

## 2016-09-19 DIAGNOSIS — F329 Major depressive disorder, single episode, unspecified: Secondary | ICD-10-CM | POA: Diagnosis not present

## 2016-09-19 DIAGNOSIS — E119 Type 2 diabetes mellitus without complications: Secondary | ICD-10-CM | POA: Diagnosis not present

## 2016-09-19 DIAGNOSIS — F0390 Unspecified dementia without behavioral disturbance: Secondary | ICD-10-CM | POA: Diagnosis not present

## 2016-09-19 DIAGNOSIS — K7581 Nonalcoholic steatohepatitis (NASH): Secondary | ICD-10-CM | POA: Diagnosis not present

## 2016-09-19 DIAGNOSIS — E538 Deficiency of other specified B group vitamins: Secondary | ICD-10-CM | POA: Diagnosis not present

## 2016-09-19 DIAGNOSIS — D696 Thrombocytopenia, unspecified: Secondary | ICD-10-CM | POA: Diagnosis not present

## 2016-09-19 DIAGNOSIS — F1310 Sedative, hypnotic or anxiolytic abuse, uncomplicated: Secondary | ICD-10-CM | POA: Diagnosis not present

## 2016-09-19 DIAGNOSIS — I10 Essential (primary) hypertension: Secondary | ICD-10-CM | POA: Diagnosis not present

## 2016-09-19 DIAGNOSIS — F419 Anxiety disorder, unspecified: Secondary | ICD-10-CM | POA: Diagnosis not present

## 2016-09-24 DIAGNOSIS — F329 Major depressive disorder, single episode, unspecified: Secondary | ICD-10-CM | POA: Diagnosis not present

## 2016-09-24 DIAGNOSIS — F0390 Unspecified dementia without behavioral disturbance: Secondary | ICD-10-CM | POA: Diagnosis not present

## 2016-09-24 DIAGNOSIS — F1310 Sedative, hypnotic or anxiolytic abuse, uncomplicated: Secondary | ICD-10-CM | POA: Diagnosis not present

## 2016-09-24 DIAGNOSIS — K7581 Nonalcoholic steatohepatitis (NASH): Secondary | ICD-10-CM | POA: Diagnosis not present

## 2016-09-24 DIAGNOSIS — D696 Thrombocytopenia, unspecified: Secondary | ICD-10-CM | POA: Diagnosis not present

## 2016-09-24 DIAGNOSIS — E538 Deficiency of other specified B group vitamins: Secondary | ICD-10-CM | POA: Diagnosis not present

## 2016-09-24 DIAGNOSIS — I10 Essential (primary) hypertension: Secondary | ICD-10-CM | POA: Diagnosis not present

## 2016-09-24 DIAGNOSIS — F419 Anxiety disorder, unspecified: Secondary | ICD-10-CM | POA: Diagnosis not present

## 2016-09-24 DIAGNOSIS — E119 Type 2 diabetes mellitus without complications: Secondary | ICD-10-CM | POA: Diagnosis not present

## 2016-09-27 DIAGNOSIS — F1310 Sedative, hypnotic or anxiolytic abuse, uncomplicated: Secondary | ICD-10-CM | POA: Diagnosis not present

## 2016-09-27 DIAGNOSIS — F329 Major depressive disorder, single episode, unspecified: Secondary | ICD-10-CM | POA: Diagnosis not present

## 2016-09-27 DIAGNOSIS — E119 Type 2 diabetes mellitus without complications: Secondary | ICD-10-CM | POA: Diagnosis not present

## 2016-09-27 DIAGNOSIS — F419 Anxiety disorder, unspecified: Secondary | ICD-10-CM | POA: Diagnosis not present

## 2016-09-27 DIAGNOSIS — F0390 Unspecified dementia without behavioral disturbance: Secondary | ICD-10-CM | POA: Diagnosis not present

## 2016-09-27 DIAGNOSIS — I10 Essential (primary) hypertension: Secondary | ICD-10-CM | POA: Diagnosis not present

## 2016-09-27 DIAGNOSIS — D696 Thrombocytopenia, unspecified: Secondary | ICD-10-CM | POA: Diagnosis not present

## 2016-09-27 DIAGNOSIS — K7581 Nonalcoholic steatohepatitis (NASH): Secondary | ICD-10-CM | POA: Diagnosis not present

## 2016-09-27 DIAGNOSIS — E538 Deficiency of other specified B group vitamins: Secondary | ICD-10-CM | POA: Diagnosis not present

## 2016-10-01 DIAGNOSIS — F1310 Sedative, hypnotic or anxiolytic abuse, uncomplicated: Secondary | ICD-10-CM | POA: Diagnosis not present

## 2016-10-01 DIAGNOSIS — E119 Type 2 diabetes mellitus without complications: Secondary | ICD-10-CM | POA: Diagnosis not present

## 2016-10-01 DIAGNOSIS — F329 Major depressive disorder, single episode, unspecified: Secondary | ICD-10-CM | POA: Diagnosis not present

## 2016-10-01 DIAGNOSIS — D696 Thrombocytopenia, unspecified: Secondary | ICD-10-CM | POA: Diagnosis not present

## 2016-10-01 DIAGNOSIS — I10 Essential (primary) hypertension: Secondary | ICD-10-CM | POA: Diagnosis not present

## 2016-10-01 DIAGNOSIS — F0390 Unspecified dementia without behavioral disturbance: Secondary | ICD-10-CM | POA: Diagnosis not present

## 2016-10-01 DIAGNOSIS — E538 Deficiency of other specified B group vitamins: Secondary | ICD-10-CM | POA: Diagnosis not present

## 2016-10-01 DIAGNOSIS — F419 Anxiety disorder, unspecified: Secondary | ICD-10-CM | POA: Diagnosis not present

## 2016-10-01 DIAGNOSIS — K7581 Nonalcoholic steatohepatitis (NASH): Secondary | ICD-10-CM | POA: Diagnosis not present

## 2016-10-07 ENCOUNTER — Ambulatory Visit (INDEPENDENT_AMBULATORY_CARE_PROVIDER_SITE_OTHER): Payer: Medicare HMO | Admitting: Family Medicine

## 2016-10-07 ENCOUNTER — Encounter: Payer: Self-pay | Admitting: Family Medicine

## 2016-10-07 VITALS — BP 124/84 | HR 68 | Temp 97.9°F | Resp 18 | Ht 64.5 in | Wt 224.0 lb

## 2016-10-07 DIAGNOSIS — E538 Deficiency of other specified B group vitamins: Secondary | ICD-10-CM

## 2016-10-07 DIAGNOSIS — K7581 Nonalcoholic steatohepatitis (NASH): Secondary | ICD-10-CM

## 2016-10-07 DIAGNOSIS — F0390 Unspecified dementia without behavioral disturbance: Secondary | ICD-10-CM

## 2016-10-07 DIAGNOSIS — K7460 Unspecified cirrhosis of liver: Secondary | ICD-10-CM | POA: Diagnosis not present

## 2016-10-07 DIAGNOSIS — E119 Type 2 diabetes mellitus without complications: Secondary | ICD-10-CM

## 2016-10-07 MED ORDER — METOPROLOL TARTRATE 25 MG PO TABS: 25.0000 mg | ORAL_TABLET | Freq: Two times a day (BID) | ORAL | 3 refills | Status: DC

## 2016-10-07 MED ORDER — LEVOTHYROXINE SODIUM 75 MCG PO TABS: 75.0000 ug | ORAL_TABLET | Freq: Every day | ORAL | 3 refills | Status: DC

## 2016-10-07 MED ORDER — CYANOCOBALAMIN 1000 MCG/ML IJ SOLN
1000.0000 ug | Freq: Once | INTRAMUSCULAR | Status: AC
  Administered 2016-10-07: 1000 ug via INTRAMUSCULAR

## 2016-10-07 MED ORDER — LOSARTAN POTASSIUM-HCTZ 100-12.5 MG PO TABS: 1.0000 | ORAL_TABLET | Freq: Every day | ORAL | 3 refills | Status: DC

## 2016-10-07 MED ORDER — METFORMIN HCL ER 500 MG PO TB24: 1000.0000 mg | ORAL_TABLET | Freq: Two times a day (BID) | ORAL | 1 refills | Status: DC

## 2016-10-07 MED ORDER — GLIMEPIRIDE 1 MG PO TABS: 1.0000 mg | ORAL_TABLET | Freq: Every day | ORAL | 1 refills | Status: DC

## 2016-10-07 NOTE — Progress Notes (Signed)
Subjective:    Patient ID: Kelly [name], female    DOB: August 24, 1950, 66 y.o.   MRN: [id number]  HPI Patient is a new patient here today with her son to establish care. Apparently she was recently admitted to the hospital with altered mental status. She was diagnosed with benzodiazepine intoxication and underlying dementia. She is here today for follow-up. She also apparently had low B12 levels which was confirmed in the hospital and she's been taking B12 tablets. Patient states that she is always been extremely anxious to stay at home alone. Recently her husband has been in a rehabilitation facility after surgery. Therefore she was taking Xanax prescribed by her previous primary care provider in addition to Ambien which she will take at night to help her sleep. Apparently she has been on Ambien for many many years due to chronic insomnia. Her son was unable to get her to answer the door one morning and broke in the back door to find his mother extremely intoxicated and confused. She was taken to the hospital. Their CAT scan showed mild diffuse cortical thickening. Ammonia levels were normal. RPR was normal. TSH was normal. Hemoglobin A1c was 5.4 PA B12 level was low at less than 200. Otherwise workup was unremarkable aside from some thrombocytopenia which is chronic and a mild anemia presumed to be secondary to B12 deficiency and iron deficiency. Since discharge from the hospital she is not taking any further Xanax. Her son has also not allowed her to have Ambien over the last month. She is here today for evaluation. I performed a Mini-Mental status exam. The patient is easily able to score is 5 out of 5. She also easily tells me the location. She is able to remember 3 objects easily. She can spell world in reverse and perform serial sevens. However the patient does have a noticeable word finding difficulty just in general communication. She has a difficult time remembering the names of objects and remembering  what she's trying to tell me. On clock drawing assessment, the patient is able to correctly draw the clock and the time. Takes for a prolonged period of time to do this. I then performed a Montreal cognitive assessment and the patient had an extremely difficult time performing the pattern test alternating between letters and numbers in order. Even after I explained to the patient she has a difficult time drawing the areas appropriately. Therefore it appears that she has some mild dementia with some mild anomia.  Patient is requesting a refill on her Ambien.  She also has a history of lymphocytic colitis previously managed by Dr. [name]. She has not seen him in quite some time and she is not taking that the desonide that he had previously prescribed. As a result she has frequent diarrhea which could be contributing to her B12 deficiency the fact of malabsorptive diarrhea. She also has underlying history of nonalcoholic steatohepatitis complicated by thrombocytopenia. Platelet counts in the hospital between 70 and 80. She has a mild anemia with hemoglobin between 11 and 12. Her diabetes thankfully was well controlled at 5.4 and her TSH was within normal limits.  Past Medical History:  Diagnosis Date  . Adenomatous polyp   . Anxiety   . Arrhythmia   . Diabetes ([place])   . Diverticulosis   . Fatty liver   . Hyperlipidemia   . Hypertension   . NASH (nonalcoholic steatohepatitis)   . Obesity   . SOB (shortness of breath)   . Thrombocytopenia ([place])   .  Thyroid disease   . Vitamin D deficiency    Past Surgical History:  Procedure Laterality Date  . oopherectomy     Current Outpatient Prescriptions on File Prior to Visit  Medication Sig Dispense Refill  . vitamin B-12 (CYANOCOBALAMIN) 1000 MCG tablet Take 1 tablet (1,000 mcg total) by mouth daily. 30 tablet 0  . zolpidem (AMBIEN) 5 MG tablet Take 1 tablet (5 mg total) by mouth at bedtime as needed for sleep. 10 tablet 0   No current  facility-administered medications on file prior to visit.    Allergies  Allergen Reactions  . Celecoxib   . Prednisone Itching    Heart rate increased  . Macrodantin [Nitrofurantoin Macrocrystal] Rash  . Penicillins Hives    Per family   Social History   Social History  . Marital status: Married    Spouse name: N/A  . Number of children: N/A  . Years of education: N/A   Occupational History  . Not on file.   Social History Main Topics  . Smoking status: Current Every Day Smoker  . Smokeless tobacco: Never Used  . Alcohol use No  . Drug use: No  . Sexual activity: Not on file   Other Topics Concern  . Not on file   Social History Narrative  . No narrative on file   Family History  Problem Relation Age of Onset  . Lung cancer Father   . Heart disease Father   . Alzheimer's disease Mother       Review of Systems  All other systems reviewed and are negative.      Objective:   Physical Exam  Constitutional: She is oriented to person, place, and time. She appears well-developed and well-nourished.  HENT:  Right Ear: External ear normal.  Left Ear: External ear normal.  Nose: Nose normal.  Mouth/Throat: Oropharynx is clear and moist. No oropharyngeal exudate.  Eyes: Conjunctivae and EOM are normal. Pupils are equal, round, and reactive to light.  Neck: Neck supple. No JVD present. No thyromegaly present.  Cardiovascular: Normal rate, regular rhythm and normal heart sounds.   No murmur heard. Pulmonary/Chest: Effort normal and breath sounds normal. No respiratory distress. She has no wheezes. She has no rales.  Abdominal: Soft. Bowel sounds are normal. She exhibits no distension. There is no tenderness. There is no rebound and no guarding.  Musculoskeletal: She exhibits no edema.  Lymphadenopathy:    She has no cervical adenopathy.  Neurological: She is alert and oriented to person, place, and time. She has normal reflexes. She displays normal reflexes. No  cranial nerve deficit. She exhibits normal muscle tone. Coordination normal.  Psychiatric: She has a normal mood and affect. Her behavior is normal. Judgment and thought content normal.  Vitals reviewed.         Assessment & Plan:  B12 deficiency - Plan: cyanocobalamin ((VITAMIN B-12)) injection 1,000 mcg  Liver cirrhosis secondary to NASH (HCC)  Type 2 diabetes mellitus without complication, without long-term current use of insulin (HCC)  Dementia without behavioral disturbance, unspecified dementia type - Plan: Ambulatory referral to Neuropsychology  The patient's memory loss was likely multifactorial. Given the cortical thickening on CT scan and some of the findings demonstrated today on her cognitive assessment she appears to have a very mild dementia. It is difficult for me to gauge this given the fact I am just meeting the patient today. I believe this is likely exacerbated by the combination of benzodiazepines which she Humble have taken several  and her Ambien. This could also be affected by her B12 deficiency. Therefore I recommended that we treat the patient's B12 deficiency with B12 injections parenterally 1000 g every month as I'm concerned that she does not absorb B12 well to the gastrointestinal tract due to her lymphocytic colitis. I've also recommended that she start iron replacement. I spent more than 45 minutes with this patient discussing her diagnosis and evaluating her. I strongly recommended that we keep her away from any medication that can exacerbate her memory particularly benzodiazepines and Ambien. I believe a lot of her problems are exacerbated by anxiety. Therefore I recommended neuropsychological consultation to determine medication options. Perhaps trazodone would be a better option for this patient rather than Ambien. I would like to see her back in one month to discuss and follow-up. I refilled her other medications. No lab work was obtained at this visit but I  reviewed the battery of lab work that was obtained at her most recent hospitalization: Admission on 09/14/2016, Discharged on 09/16/2016  Component Date Value Ref Range Status  . WBC 09/14/2016 6.5  4.0 - 10.5 K/uL Final  . RBC 09/14/2016 4.22  3.87 - 5.11 MIL/uL Final  . Hemoglobin 09/14/2016 11.7* 12.0 - 15.0 g/dL Final  . HCT 09/14/2016 34.4* 36.0 - 46.0 % Final  . MCV 09/14/2016 81.5  78.0 - 100.0 fL Final  . MCH 09/14/2016 27.7  26.0 - 34.0 pg Final  . MCHC 09/14/2016 34.0  30.0 - 36.0 g/dL Final  . RDW 09/14/2016 15.2  11.5 - 15.5 % Final  . Platelets 09/14/2016 78* 150 - 400 K/uL Final   Comment: SPECIMEN CHECKED FOR CLOTS REPEATED TO VERIFY PLATELET COUNT CONFIRMED BY SMEAR   . Neutrophils Relative % 09/14/2016 51  % Final  . Neutro Abs 09/14/2016 3.3  1.7 - 7.7 K/uL Final  . Lymphocytes Relative 09/14/2016 36  % Final  . Lymphs Abs 09/14/2016 2.4  0.7 - 4.0 K/uL Final  . Monocytes Relative 09/14/2016 11  % Final  . Monocytes Absolute 09/14/2016 0.7  0.1 - 1.0 K/uL Final  . Eosinophils Relative 09/14/2016 2  % Final  . Eosinophils Absolute 09/14/2016 0.1  0.0 - 0.7 K/uL Final  . Basophils Relative 09/14/2016 0  % Final  . Basophils Absolute 09/14/2016 0.0  0.0 - 0.1 K/uL Final  . Ammonia 09/14/2016 35  9 - 35 umol/L Final  . Sodium 09/14/2016 143  135 - 145 mmol/L Final  . Potassium 09/14/2016 3.5  3.5 - 5.1 mmol/L Final  . Chloride 09/14/2016 108  101 - 111 mmol/L Final  . BUN 09/14/2016 10  6 - 20 mg/dL Final  . Creatinine, Ser 09/14/2016 0.80  0.44 - 1.00 mg/dL Final  . Glucose, Bld 09/14/2016 90  65 - 99 mg/dL Final  . Calcium, Ion 09/14/2016 1.17  1.15 - 1.40 mmol/L Final  . TCO2 09/14/2016 25  0 - 100 mmol/L Final  . Hemoglobin 09/14/2016 11.2* 12.0 - 15.0 g/dL Final  . HCT 09/14/2016 33.0* 36.0 - 46.0 % Final  . Sodium 09/14/2016 141  135 - 145 mmol/L Final  . Potassium 09/14/2016 3.5  3.5 - 5.1 mmol/L Final  . Chloride 09/14/2016 107  101 - 111 mmol/L Final  .  CO2 09/14/2016 26  22 - 32 mmol/L Final  . Glucose, Bld 09/14/2016 93  65 - 99 mg/dL Final  . BUN 09/14/2016 12  6 - 20 mg/dL Final  . Creatinine, Ser 09/14/2016 0.86  0.44 - 1.00  mg/dL Final  . Calcium 09/14/2016 9.6  8.9 - 10.3 mg/dL Final  . Total Protein 09/14/2016 7.7  6.5 - 8.1 g/dL Final  . Albumin 09/14/2016 4.0  3.5 - 5.0 g/dL Final  . AST 09/14/2016 29  15 - 41 U/L Final  . ALT 09/14/2016 15  14 - 54 U/L Final  . Alkaline Phosphatase 09/14/2016 87  38 - 126 U/L Final  . Total Bilirubin 09/14/2016 1.4* 0.3 - 1.2 mg/dL Final  . GFR calc non Af Amer 09/14/2016 >60  >60 mL/min Final  . GFR calc Af Amer 09/14/2016 >60  >60 mL/min Final   Comment: (NOTE) The eGFR has been calculated using the CKD EPI equation. This calculation has not been validated in all clinical situations. eGFR's persistently <60 mL/min signify possible Chronic Kidney Disease.   . Anion gap 09/14/2016 8  5 - 15 Final  . Opiates 09/14/2016 NONE DETECTED  NONE DETECTED Final  . Cocaine 09/14/2016 NONE DETECTED  NONE DETECTED Final  . Benzodiazepines 09/14/2016 POSITIVE* NONE DETECTED Final  . Amphetamines 09/14/2016 NONE DETECTED  NONE DETECTED Final  . Tetrahydrocannabinol 09/14/2016 NONE DETECTED  NONE DETECTED Final  . Barbiturates 09/14/2016 NONE DETECTED  NONE DETECTED Final   Comment:        DRUG SCREEN FOR MEDICAL PURPOSES ONLY.  IF CONFIRMATION IS NEEDED FOR ANY PURPOSE, NOTIFY LAB WITHIN 5 DAYS.        LOWEST DETECTABLE LIMITS FOR URINE DRUG SCREEN Drug Class       Cutoff (ng/mL) Amphetamine      1000 Barbiturate      200 Benzodiazepine   240 Tricyclics       973 Opiates          300 Cocaine          300 THC              50   . Alcohol, Ethyl (B) 09/14/2016 <5  <5 mg/dL Final   Comment:        LOWEST DETECTABLE LIMIT FOR SERUM ALCOHOL IS 5 mg/dL FOR MEDICAL PURPOSES ONLY   . Color, Urine 09/14/2016 AMBER* YELLOW Final  . APPearance 09/14/2016 HAZY* CLEAR Final  . Specific Gravity,  Urine 09/14/2016 1.020  1.005 - 1.030 Final  . pH 09/14/2016 5.0  5.0 - 8.0 Final  . Glucose, UA 09/14/2016 NEGATIVE  NEGATIVE mg/dL Final  . Hgb urine dipstick 09/14/2016 NEGATIVE  NEGATIVE Final  . Bilirubin Urine 09/14/2016 NEGATIVE  NEGATIVE Final  . Ketones, ur 09/14/2016 NEGATIVE  NEGATIVE mg/dL Final  . Protein, ur 09/14/2016 NEGATIVE  NEGATIVE mg/dL Final  . Nitrite 09/14/2016 NEGATIVE  NEGATIVE Final  . Leukocytes, UA 09/14/2016 NEGATIVE  NEGATIVE Final  . Glucose-Capillary 09/14/2016 87  65 - 99 mg/dL Final  . Acetaminophen (Tylenol), Serum 09/14/2016 <10* 10 - 30 ug/mL Final   Comment:        THERAPEUTIC CONCENTRATIONS VARY SIGNIFICANTLY. A RANGE OF 10-30 ug/mL [place] BE AN EFFECTIVE CONCENTRATION FOR MANY PATIENTS. HOWEVER, SOME ARE BEST TREATED AT CONCENTRATIONS OUTSIDE THIS RANGE. ACETAMINOPHEN CONCENTRATIONS >150 ug/mL AT 4 HOURS AFTER INGESTION AND >50 ug/mL AT 12 HOURS AFTER INGESTION ARE OFTEN ASSOCIATED WITH TOXIC REACTIONS.   . Salicylate Lvl 53/29/9242 <7.0  2.8 - 30.0 mg/dL Final  . LDH 09/14/2016 139  98 - 192 U/L Final  . Haptoglobin 09/14/2016 29* 34 - 200 mg/dL Final   Comment: (NOTE) Performed At: [facility] [address] [place], [place] [id number] Lindon Romp  MD [id number]   . Prothrombin Time 09/14/2016 14.3  11.4 - 15.2 seconds Final  . INR 09/14/2016 1.10   Final  . aPTT 09/14/2016 40* 24 - 36 seconds Final   Comment:        IF BASELINE aPTT IS ELEVATED, SUGGEST PATIENT RISK ASSESSMENT BE USED TO DETERMINE APPROPRIATE ANTICOAGULANT THERAPY.   . Tech Review 09/14/2016 SMEAR STAINED AND AVAILABLE FOR REVIEW   Final   Comment: SPHEROCYTES LARGE PLATELETS PRESENT   . WBC 09/14/2016 6.2  4.0 - 10.5 K/uL Final  . RBC 09/14/2016 4.12  3.87 - 5.11 MIL/uL Final  . Hemoglobin 09/14/2016 11.4* 12.0 - 15.0 g/dL Final  . HCT 09/14/2016 33.6* 36.0 - 46.0 % Final  . MCV 09/14/2016 81.6  78.0 - 100.0 fL Final  . MCH 09/14/2016 27.7   26.0 - 34.0 pg Final  . MCHC 09/14/2016 33.9  30.0 - 36.0 g/dL Final  . RDW 09/14/2016 15.2  11.5 - 15.5 % Final  . Platelets 09/14/2016 74* 150 - 400 K/uL Final   Comment: SPECIMEN CHECKED FOR CLOTS REPEATED TO VERIFY PLATELET COUNT CONFIRMED BY SMEAR   . Folate, Hemolysate 09/15/2016 339.6  Not Estab. ng/mL Final  . Hematocrit 09/15/2016 32.4* 34.0 - 46.6 % Final  . Folate, RBC 09/15/2016 1048  >498 ng/mL Final   Comment: (NOTE) Performed At: [facility] [place], [place] [id number] Lindon Romp MD [id number]   . Vitamin B-12 09/15/2016 179* 180 - 914 pg/mL Final   Comment: (NOTE) This assay is not validated for testing neonatal or myeloproliferative syndrome specimens for Vitamin B12 levels. Performed at [facility], [place] [address], [place],  [address]   . RPR Ser Ql 09/15/2016 Non Reactive  Non Reactive Final   Comment: (NOTE) Performed At: [facility] [place], [place] [id number] Lindon Romp MD YI:[id number]   . HIV Screen 4th Generation wRfx 09/15/2016 Non Reactive  Non Reactive Final   Comment: (NOTE) Performed At: [facility] [place], [place] [id number] Lindon Romp MD [id number]   . Magnesium 09/15/2016 2.0  1.7 - 2.4 mg/dL Final  . Phosphorus 09/15/2016 3.7  2.5 - 4.6 mg/dL Final  . TSH 09/15/2016 1.244  0.350 - 4.500 uIU/mL Final  . Sodium 09/15/2016 142  135 - 145 mmol/L Final  . Potassium 09/15/2016 3.5  3.5 - 5.1 mmol/L Final  . Chloride 09/15/2016 109  101 - 111 mmol/L Final  . CO2 09/15/2016 27  22 - 32 mmol/L Final  . Glucose, Bld 09/15/2016 86  65 - 99 mg/dL Final  . BUN 09/15/2016 10  6 - 20 mg/dL Final  . Creatinine, Ser 09/15/2016 0.66  0.44 - 1.00 mg/dL Final  . Calcium 09/15/2016 9.0  8.9 - 10.3 mg/dL Final  . Total Protein 09/15/2016 6.8  6.5 - 8.1 g/dL Final  . Albumin 09/15/2016 3.6  3.5 - 5.0 g/dL Final  . AST 09/15/2016 29  15 - 41 U/L  Final  . ALT 09/15/2016 15  14 - 54 U/L Final  . Alkaline Phosphatase 09/15/2016 78  38 - 126 U/L Final  . Total Bilirubin 09/15/2016 0.8  0.3 - 1.2 mg/dL Final  . GFR calc non Af Amer 09/15/2016 >60  >60 mL/min Final  . GFR calc Af Amer 09/15/2016 >60  >60 mL/min Final   Comment: (NOTE) The eGFR has been calculated using the CKD EPI equation. This calculation has not been validated in all  clinical situations. eGFR's persistently <60 mL/min signify possible Chronic Kidney Disease.   . Anion gap 09/15/2016 6  5 - 15 Final  . WBC 09/15/2016 6.2  4.0 - 10.5 K/uL Final  . RBC 09/15/2016 3.98  3.87 - 5.11 MIL/uL Final  . Hemoglobin 09/15/2016 11.1* 12.0 - 15.0 g/dL Final  . HCT 09/15/2016 32.2* 36.0 - 46.0 % Final  . MCV 09/15/2016 80.9  78.0 - 100.0 fL Final  . MCH 09/15/2016 27.9  26.0 - 34.0 pg Final  . MCHC 09/15/2016 34.5  30.0 - 36.0 g/dL Final  . RDW 09/15/2016 14.8  11.5 - 15.5 % Final  . Platelets 09/15/2016 74* 150 - 400 K/uL Final  . Hgb A1c MFr Bld 09/15/2016 5.4  4.8 - 5.6 % Final   Comment: (NOTE)         Pre-diabetes: 5.7 - 6.4         Diabetes: >6.4         Glycemic control for adults with diabetes: <7.0   . Mean Plasma Glucose 09/15/2016 108  mg/dL Final   Comment: (NOTE) Performed At: [facility] [place], [place] [id number] Lindon Romp MD UX:[id number]   . Prealbumin 09/15/2016 10.8* 18 - 38 mg/dL Final  . Vitamin B-12 09/15/2016 186  180 - 914 pg/mL Final   Comment: (NOTE) This assay is not validated for testing neonatal or myeloproliferative syndrome specimens for Vitamin B12 levels. Performed at [facility], [place] [address], [place], [place] [address]   . Folate 09/15/2016 15.6  >5.9 ng/mL Final  . Iron 09/15/2016 61  28 - 170 ug/dL Final  . TIBC 09/15/2016 353  250 - 450 ug/dL Final  . Saturation Ratios 09/15/2016 17  10.4 - 31.8 % Final  . UIBC 09/15/2016 292  ug/dL Final  . Ferritin 09/15/2016 26  11 - 307 ng/mL  Final  . Retic Ct Pct 09/15/2016 1.9  0.4 - 3.1 % Final  . RBC. 09/15/2016 3.99  3.87 - 5.11 MIL/uL Final  . Retic Count, Manual 09/15/2016 75.8  19.0 - 186.0 K/uL Final  . RPR Ser Ql 09/15/2016 Non Reactive  Non Reactive Final   Comment: (NOTE) Performed At: [facility] [place], [place] [id number] Lindon Romp MD [id number]   . Glucose-Capillary 09/15/2016 82  65 - 99 mg/dL Final  . Comment 1 09/15/2016 Notify RN   Final  . Comment 2 09/15/2016 Document in Chart   Final  . Glucose-Capillary 09/15/2016 93  65 - 99 mg/dL Final  . Comment 1 09/15/2016 Notify RN   Final  . Comment 2 09/15/2016 Document in Chart   Final  . Glucose-Capillary 09/15/2016 78  65 - 99 mg/dL Final  . Glucose-Capillary 09/16/2016 90  65 - 99 mg/dL Final  . Glucose-Capillary 09/16/2016 138* 65 - 99 mg/dL Final

## 2016-10-17 ENCOUNTER — Other Ambulatory Visit: Payer: Self-pay | Admitting: Family Medicine

## 2016-10-17 MED ORDER — TRAZODONE HCL 50 MG PO TABS: 50.0000 mg | ORAL_TABLET | Freq: Every evening | ORAL | 1 refills | Status: DC | PRN

## 2016-10-18 ENCOUNTER — Telehealth: Payer: Self-pay | Admitting: Family Medicine

## 2016-10-18 DIAGNOSIS — E119 Type 2 diabetes mellitus without complications: Secondary | ICD-10-CM | POA: Diagnosis not present

## 2016-10-18 DIAGNOSIS — F329 Major depressive disorder, single episode, unspecified: Secondary | ICD-10-CM | POA: Diagnosis not present

## 2016-10-18 DIAGNOSIS — I10 Essential (primary) hypertension: Secondary | ICD-10-CM | POA: Diagnosis not present

## 2016-10-18 DIAGNOSIS — F419 Anxiety disorder, unspecified: Secondary | ICD-10-CM | POA: Diagnosis not present

## 2016-10-18 DIAGNOSIS — E538 Deficiency of other specified B group vitamins: Secondary | ICD-10-CM | POA: Diagnosis not present

## 2016-10-18 DIAGNOSIS — F1310 Sedative, hypnotic or anxiolytic abuse, uncomplicated: Secondary | ICD-10-CM | POA: Diagnosis not present

## 2016-10-18 DIAGNOSIS — K7581 Nonalcoholic steatohepatitis (NASH): Secondary | ICD-10-CM | POA: Diagnosis not present

## 2016-10-18 DIAGNOSIS — D696 Thrombocytopenia, unspecified: Secondary | ICD-10-CM | POA: Diagnosis not present

## 2016-10-18 DIAGNOSIS — F0390 Unspecified dementia without behavioral disturbance: Secondary | ICD-10-CM | POA: Diagnosis not present

## 2016-10-18 NOTE — Telephone Encounter (Signed)
Amy has questions regarding mrs. Hannula's health please contact her on her cell

## 2016-10-21 NOTE — Telephone Encounter (Signed)
LMTRC

## 2016-11-07 ENCOUNTER — Telehealth: Payer: Self-pay | Admitting: Family Medicine

## 2016-11-07 ENCOUNTER — Encounter: Payer: Self-pay | Admitting: Family Medicine

## 2016-11-07 ENCOUNTER — Ambulatory Visit (INDEPENDENT_AMBULATORY_CARE_PROVIDER_SITE_OTHER): Payer: Medicare HMO | Admitting: Family Medicine

## 2016-11-07 VITALS — BP 152/74 | HR 62 | Temp 97.8°F | Resp 18 | Ht 64.5 in | Wt 211.0 lb

## 2016-11-07 DIAGNOSIS — E538 Deficiency of other specified B group vitamins: Secondary | ICD-10-CM | POA: Diagnosis not present

## 2016-11-07 DIAGNOSIS — F321 Major depressive disorder, single episode, moderate: Secondary | ICD-10-CM

## 2016-11-07 DIAGNOSIS — F0390 Unspecified dementia without behavioral disturbance: Secondary | ICD-10-CM | POA: Diagnosis not present

## 2016-11-07 MED ORDER — CYANOCOBALAMIN 1000 MCG/ML IJ SOLN
1000.0000 ug | Freq: Once | INTRAMUSCULAR | Status: AC
  Administered 2016-11-07: 1000 ug via INTRAMUSCULAR

## 2016-11-07 NOTE — Telephone Encounter (Signed)
Patients son is calling regarding his moms visit today please call [place] at [phone] he just has some questions and wants to speak to you only since he could not be here

## 2016-11-07 NOTE — Progress Notes (Signed)
Subjective:    Patient ID: Kelly [name], female    DOB: Feb 12, 1951, 66 y.o.   MRN: [id number]  HPI  10/2016 Patient is a new patient here today with her son to establish care. Apparently she was recently admitted to the hospital with altered mental status. She was diagnosed with benzodiazepine intoxication and underlying dementia. She is here today for follow-up. She also apparently had low B12 levels which was confirmed in the hospital and she's been taking B12 tablets. Patient states that she is always been extremely anxious to stay at home alone. Recently her husband has been in a rehabilitation facility after surgery. Therefore she was taking Xanax prescribed by her previous primary care provider in addition to Ambien which she will take at night to help her sleep. Apparently she has been on Ambien for many many years due to chronic insomnia. Her son was unable to get her to answer the door one morning and broke in the back door to find his mother extremely intoxicated and confused. She was taken to the hospital. Their CAT scan showed mild diffuse cortical thickening. Ammonia levels were normal. RPR was normal. TSH was normal. Hemoglobin A1c was 5.4 PA B12 level was low at less than 200. Otherwise workup was unremarkable aside from some thrombocytopenia which is chronic and a mild anemia presumed to be secondary to B12 deficiency and iron deficiency. Since discharge from the hospital she is not taking any further Xanax. Her son has also not allowed her to have Ambien over the last month. She is here today for evaluation. I performed a Mini-Mental status exam. The patient is easily able to score is 5 out of 5. She also easily tells me the location. She is able to remember 3 objects easily. She can spell world in reverse and perform serial sevens. However the patient does have a noticeable word finding difficulty just in general communication. She has a difficult time remembering the names of objects and  remembering what she's trying to tell me. On clock drawing assessment, the patient is able to correctly draw the clock and the time. Takes for a prolonged period of time to do this. I then performed a [place] cognitive assessment and the patient had an extremely difficult time performing the pattern test alternating between letters and numbers in order. Even after I explained to the patient she has a difficult time drawing the areas appropriately. Therefore it appears that she has some mild dementia with some mild anomia.  Patient is requesting a refill on her Ambien.  She also has a history of lymphocytic colitis previously managed by Dr. [name]. She has not seen him in quite some time and she is not taking that the desonide that he had previously prescribed. As a result she has frequent diarrhea which could be contributing to her B12 deficiency the fact of malabsorptive diarrhea. She also has underlying history of nonalcoholic steatohepatitis complicated by thrombocytopenia. Platelet counts in the hospital between 70 and 80. She has a mild anemia with hemoglobin between 11 and 12. Her diabetes thankfully was well controlled at 5.4 and her TSH was within normal limits.  At that time, my plan was: The patient's memory loss was likely multifactorial. Given the cortical thickening on CT scan and some of the findings demonstrated today on her cognitive assessment she appears to have a very mild dementia. It is difficult for me to gauge this given the fact I am just meeting the patient today. I believe this is  likely exacerbated by the combination of benzodiazepines which she [place] have taken several and her Ambien. This could also be affected by her B12 deficiency. Therefore I recommended that we treat the patient's B12 deficiency with B12 injections parenterally 1000 g every month as I'm concerned that she does not absorb B12 well to the gastrointestinal tract due to her lymphocytic colitis. I've also recommended  that she start iron replacement. I spent more than 45 minutes with this patient discussing her diagnosis and evaluating her. I strongly recommended that we keep her away from any medication that can exacerbate her memory particularly benzodiazepines and Ambien. I believe a lot of her problems are exacerbated by anxiety. Therefore I recommended neuropsychological consultation to determine medication options. Perhaps trazodone would be a better option for this patient rather than Ambien. I would like to see her back in one month to discuss and follow-up. I refilled her other medications. No lab work was obtained at this visit but I reviewed the battery of lab work that was obtained at her most recent hospitalization:  11/07/16 Patient is under tremendous stress. Her husband is battling congestive heart failure and recently underwent an amputation. She states that he is constantly unloading his burdens and complaints onto her. However he is in denial about her medical problems. She feels very little support. She continues to battle anxiety and insomnia. She reports poor concentration, depression, anhedonia. She has very little energy. In the past she is tried and failed Zoloft, Wellbutrin, Prozac," several others". She denies any suicidal ideation. She denies any hallucinations or delusions. She is wanting to try Xanax again to help her sleep. Please see the previous office visit we discussed the problems of Xanax created for this patient.  Past Medical History:  Diagnosis Date  . Adenomatous polyp   . Anxiety   . Arrhythmia   . Diabetes ([place])   . Diverticulosis   . Fatty liver   . Hyperlipidemia   . Hypertension   . NASH (nonalcoholic steatohepatitis)   . Obesity   . SOB (shortness of breath)   . Thrombocytopenia ([place])   . Thyroid disease   . Vitamin D deficiency    Past Surgical History:  Procedure Laterality Date  . oopherectomy     Current Outpatient Prescriptions on File Prior to Visit    Medication Sig Dispense Refill  . ACCU-CHEK AVIVA PLUS test strip     . ACCU-CHEK SOFTCLIX LANCETS lancets     . Blood Glucose Monitoring Suppl (ACCU-CHEK AVIVA PLUS) w/Device KIT     . glimepiride (AMARYL) 1 MG tablet Take 1 tablet (1 mg total) by mouth daily with breakfast. 90 tablet 1  . levothyroxine (SYNTHROID, LEVOTHROID) 75 MCG tablet Take 1 tablet (75 mcg total) by mouth daily. 90 tablet 3  . losartan-hydrochlorothiazide (HYZAAR) 100-12.5 MG tablet Take 1 tablet by mouth daily. 90 tablet 3  . metFORMIN (GLUCOPHAGE-XR) 500 MG 24 hr tablet Take 2 tablets (1,000 mg total) by mouth 2 (two) times daily. 360 tablet 1  . metoprolol tartrate (LOPRESSOR) 25 MG tablet Take 1 tablet (25 mg total) by mouth 2 (two) times daily. 180 tablet 3  . traZODone (DESYREL) 50 MG tablet Take 1 tablet (50 mg total) by mouth at bedtime as needed for sleep. 90 tablet 1  . vitamin B-12 (CYANOCOBALAMIN) 1000 MCG tablet Take 1 tablet (1,000 mcg total) by mouth daily. 30 tablet 0   No current facility-administered medications on file prior to visit.    Allergies  Allergen Reactions  . Celecoxib   . Prednisone Itching    Heart rate increased  . Macrodantin [Nitrofurantoin Macrocrystal] Rash  . Penicillins Hives    Per family   Social History   Social History  . Marital status: Married    Spouse name: N/A  . Number of children: N/A  . Years of education: N/A   Occupational History  . Not on file.   Social History Main Topics  . Smoking status: Current Every Day Smoker  . Smokeless tobacco: Never Used  . Alcohol use No  . Drug use: No  . Sexual activity: Not on file   Other Topics Concern  . Not on file   Social History Narrative  . No narrative on file   Family History  Problem Relation Age of Onset  . Lung cancer Father   . Heart disease Father   . Alzheimer's disease Mother       Review of Systems  Psychiatric/Behavioral: The patient has insomnia.   All other systems reviewed and  are negative.      Objective:   Physical Exam  Constitutional: She is oriented to person, place, and time. She appears well-developed and well-nourished.  HENT:  Right Ear: External ear normal.  Left Ear: External ear normal.  Nose: Nose normal.  Mouth/Throat: Oropharynx is clear and moist. No oropharyngeal exudate.  Eyes: Conjunctivae and EOM are normal. Pupils are equal, round, and reactive to light.  Neck: Neck supple. No JVD present. No thyromegaly present.  Cardiovascular: Normal rate, regular rhythm and normal heart sounds.   No murmur heard. Pulmonary/Chest: Effort normal and breath sounds normal. No respiratory distress. She has no wheezes. She has no rales.  Abdominal: Soft. Bowel sounds are normal. She exhibits no distension. There is no tenderness. There is no rebound and no guarding.  Musculoskeletal: She exhibits no edema.  Lymphadenopathy:    She has no cervical adenopathy.  Neurological: She is alert and oriented to person, place, and time. She has normal reflexes. No cranial nerve deficit. She exhibits normal muscle tone. Coordination normal.  Psychiatric: She has a normal mood and affect. Her behavior is normal. Judgment and thought content normal.  Vitals reviewed.         Assessment & Plan:  Vitamin B 12 deficiency - Plan: cyanocobalamin ((VITAMIN B-12)) injection 1,000 mcg  Dementia without behavioral disturbance, unspecified dementia type  Depression, major, single episode, moderate ([place])  I spent more than 25 minutes with this patient discussing her problems. I do believe she has mild dementia but also believe she could be suffering from depression. I would like to try the patient on Trintellix 5 mg a day for 1 week and gradually increased to 10 mg a day. Recheck in one month to see if there is benefit. Try to avoid high-risk medications such as benzodiazepines. She can continue use the trazodone as needed for sleep.

## 2016-11-11 DIAGNOSIS — F329 Major depressive disorder, single episode, unspecified: Secondary | ICD-10-CM | POA: Diagnosis not present

## 2016-11-11 DIAGNOSIS — F419 Anxiety disorder, unspecified: Secondary | ICD-10-CM | POA: Diagnosis not present

## 2016-11-11 DIAGNOSIS — D696 Thrombocytopenia, unspecified: Secondary | ICD-10-CM | POA: Diagnosis not present

## 2016-11-11 DIAGNOSIS — K7581 Nonalcoholic steatohepatitis (NASH): Secondary | ICD-10-CM | POA: Diagnosis not present

## 2016-11-11 DIAGNOSIS — I10 Essential (primary) hypertension: Secondary | ICD-10-CM | POA: Diagnosis not present

## 2016-11-11 DIAGNOSIS — E119 Type 2 diabetes mellitus without complications: Secondary | ICD-10-CM | POA: Diagnosis not present

## 2016-11-11 DIAGNOSIS — F0390 Unspecified dementia without behavioral disturbance: Secondary | ICD-10-CM | POA: Diagnosis not present

## 2016-11-11 DIAGNOSIS — F1310 Sedative, hypnotic or anxiolytic abuse, uncomplicated: Secondary | ICD-10-CM | POA: Diagnosis not present

## 2016-11-11 DIAGNOSIS — E538 Deficiency of other specified B group vitamins: Secondary | ICD-10-CM | POA: Diagnosis not present

## 2016-11-15 ENCOUNTER — Encounter: Payer: Self-pay | Admitting: Gastroenterology

## 2016-11-15 DIAGNOSIS — E119 Type 2 diabetes mellitus without complications: Secondary | ICD-10-CM | POA: Diagnosis not present

## 2016-11-15 DIAGNOSIS — E538 Deficiency of other specified B group vitamins: Secondary | ICD-10-CM | POA: Diagnosis not present

## 2016-11-15 DIAGNOSIS — D696 Thrombocytopenia, unspecified: Secondary | ICD-10-CM | POA: Diagnosis not present

## 2016-11-15 DIAGNOSIS — F1310 Sedative, hypnotic or anxiolytic abuse, uncomplicated: Secondary | ICD-10-CM | POA: Diagnosis not present

## 2016-11-15 DIAGNOSIS — F0390 Unspecified dementia without behavioral disturbance: Secondary | ICD-10-CM | POA: Diagnosis not present

## 2016-11-15 DIAGNOSIS — I10 Essential (primary) hypertension: Secondary | ICD-10-CM | POA: Diagnosis not present

## 2016-11-15 DIAGNOSIS — K7581 Nonalcoholic steatohepatitis (NASH): Secondary | ICD-10-CM | POA: Diagnosis not present

## 2016-11-15 DIAGNOSIS — F419 Anxiety disorder, unspecified: Secondary | ICD-10-CM | POA: Diagnosis not present

## 2016-11-15 DIAGNOSIS — F329 Major depressive disorder, single episode, unspecified: Secondary | ICD-10-CM | POA: Diagnosis not present

## 2016-11-15 NOTE — Telephone Encounter (Signed)
Error

## 2016-11-18 ENCOUNTER — Telehealth: Payer: Self-pay | Admitting: Family Medicine

## 2016-11-18 NOTE — Telephone Encounter (Signed)
Try reducing metoprolol to 12.5 bid to see if fatigue improves.

## 2016-11-18 NOTE — Telephone Encounter (Signed)
Amy from [facility] called and states that they are discharging pt from care and wanted to make you aware that when she checked her HR it was between 50-52 in the past it is some where around 56-60. Her BP was 136/80. The pt does c/o of being tired all the time.  She is taking Metoprolol 33m bid and she was wondering if maybe that needed to be reduced??   CB# [phone]

## 2016-11-20 MED ORDER — METOPROLOL TARTRATE 25 MG PO TABS: 12.5000 mg | ORAL_TABLET | Freq: Two times a day (BID) | ORAL | 3 refills | Status: DC

## 2016-11-20 NOTE — Telephone Encounter (Signed)
Amy aware and will call pt to inform of provider recommendations.

## 2016-11-22 ENCOUNTER — Ambulatory Visit (INDEPENDENT_AMBULATORY_CARE_PROVIDER_SITE_OTHER): Payer: Medicare HMO | Admitting: Psychiatry

## 2016-11-22 ENCOUNTER — Encounter (HOSPITAL_COMMUNITY): Payer: Self-pay | Admitting: Psychiatry

## 2016-11-22 VITALS — BP 124/82 | HR 55 | Temp 97.0°F | Wt 207.6 lb

## 2016-11-22 DIAGNOSIS — Z811 Family history of alcohol abuse and dependence: Secondary | ICD-10-CM

## 2016-11-22 DIAGNOSIS — Z79899 Other long term (current) drug therapy: Secondary | ICD-10-CM | POA: Diagnosis not present

## 2016-11-22 DIAGNOSIS — F17210 Nicotine dependence, cigarettes, uncomplicated: Secondary | ICD-10-CM

## 2016-11-22 DIAGNOSIS — F330 Major depressive disorder, recurrent, mild: Secondary | ICD-10-CM

## 2016-11-22 DIAGNOSIS — Z810 Family history of intellectual disabilities: Secondary | ICD-10-CM | POA: Diagnosis not present

## 2016-11-22 MED ORDER — ZOLPIDEM TARTRATE 5 MG PO TABS: 5.0000 mg | ORAL_TABLET | Freq: Every evening | ORAL | 0 refills | Status: DC | PRN
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
## 2016-12-10 ENCOUNTER — Telehealth: Payer: Self-pay | Admitting: Gastroenterology

## 2016-12-10 ENCOUNTER — Ambulatory Visit: Payer: Self-pay | Admitting: Gastroenterology

## 2016-12-13 ENCOUNTER — Other Ambulatory Visit (HOSPITAL_COMMUNITY): Payer: Self-pay | Admitting: Psychiatry

## 2016-12-13 DIAGNOSIS — F330 Major depressive disorder, recurrent, mild: Secondary | ICD-10-CM

## 2016-12-18 ENCOUNTER — Encounter (INDEPENDENT_AMBULATORY_CARE_PROVIDER_SITE_OTHER): Payer: Self-pay

## 2016-12-18 ENCOUNTER — Other Ambulatory Visit (INDEPENDENT_AMBULATORY_CARE_PROVIDER_SITE_OTHER): Payer: Medicare HMO

## 2016-12-18 ENCOUNTER — Encounter: Payer: Self-pay | Admitting: Physician Assistant

## 2016-12-18 ENCOUNTER — Ambulatory Visit (INDEPENDENT_AMBULATORY_CARE_PROVIDER_SITE_OTHER): Payer: Medicare HMO | Admitting: Physician Assistant

## 2016-12-18 VITALS — BP 144/80 | HR 58 | Ht 65.0 in | Wt 207.0 lb

## 2016-12-18 DIAGNOSIS — K7581 Nonalcoholic steatohepatitis (NASH): Secondary | ICD-10-CM

## 2016-12-18 DIAGNOSIS — R197 Diarrhea, unspecified: Secondary | ICD-10-CM

## 2016-12-18 DIAGNOSIS — K7460 Unspecified cirrhosis of liver: Secondary | ICD-10-CM | POA: Diagnosis not present

## 2016-12-18 DIAGNOSIS — R6889 Other general symptoms and signs: Secondary | ICD-10-CM | POA: Diagnosis not present

## 2016-12-18 LAB — COMPREHENSIVE METABOLIC PANEL
ALBUMIN: 3.9 g/dL (ref 3.5–5.2)
ALK PHOS: 84 U/L (ref 39–117)
ALT: 11 U/L (ref 0–35)
AST: 17 U/L (ref 0–37)
BUN: 11 mg/dL (ref 6–23)
CO2: 26 mEq/L (ref 19–32)
CREATININE: 0.78 mg/dL (ref 0.40–1.20)
Calcium: 9.4 mg/dL (ref 8.4–10.5)
Chloride: 107 mEq/L (ref 96–112)
GFR: 78.02 mL/min (ref >60.00)
GLUCOSE: 137 mg/dL — AB (ref 70–99)
POTASSIUM: 3.8 meq/L (ref 3.5–5.1)
SODIUM: 140 meq/L (ref 135–145)
TOTAL PROTEIN: 7.3 g/dL (ref 6.0–8.3)
Total Bilirubin: 0.7 mg/dL (ref 0.2–1.2)

## 2016-12-18 LAB — CBC WITH DIFFERENTIAL/PLATELET
BASOS PCT: 0.9 % (ref 0.0–3.0)
Basophils Absolute: 0.1 10*3/uL (ref 0.0–0.1)
Eosinophils Absolute: 0.4 10*3/uL (ref 0.0–0.7)
Eosinophils Relative: 3.6 % (ref 0.0–5.0)
HEMATOCRIT: 36.2 % (ref 36.0–46.0)
HEMOGLOBIN: 12.5 g/dL (ref 12.0–15.0)
LYMPHS PCT: 28.7 % (ref 12.0–46.0)
Lymphs Abs: 2.9 10*3/uL (ref 0.7–4.0)
MCHC: 34.5 g/dL (ref 30.0–36.0)
MCV: 81 fl (ref 78.0–100.0)
MONOS PCT: 8.2 % (ref 3.0–12.0)
Monocytes Absolute: 0.8 10*3/uL (ref 0.1–1.0)
NEUTROS PCT: 58.6 % (ref 43.0–77.0)
Neutro Abs: 5.9 10*3/uL (ref 1.4–7.7)
Platelets: 126 10*3/uL — ABNORMAL LOW (ref 150.0–400.0)
RBC: 4.47 Mil/uL (ref 3.87–5.11)
RDW: 15.5 % (ref 11.5–15.5)
WBC: 10.1 10*3/uL (ref 4.0–10.5)

## 2016-12-18 LAB — PROTIME-INR
INR: 1.1 ratio — AB (ref 0.8–1.0)
Prothrombin Time: 11.6 s (ref 9.6–13.1)

## 2016-12-18 NOTE — Patient Instructions (Addendum)
You have been given a separate informational sheet regarding your tobacco use, the importance of quitting and local resources to help you quit.  Your physician has requested that you go to the basement for lab work before leaving today.

## 2016-12-18 NOTE — Progress Notes (Signed)
I agree with the above note, plan 

## 2016-12-18 NOTE — Progress Notes (Signed)
Review of gastrointestinal problems:  1. small tubular adenoma in colon, removed September, 2009. Next colonoscopy September 2014  2. left-sided diverticulosis with diverticular associated edema noted by colonoscopy 2009  3. abnormal liver tests noted July, 2010: Likely from NASH Blood work August, 2010: Hepatitis A., B., C. were all negative; alpha one antitrypsin level normal, ceruloplasmin level normal, AMA negative, AMA negative, iron studies normal, anti-smooth muscle antibody negative, AST and ALT in 50-70 range; TTG negative, IgA level normal. Liver ultrasound suggested fatty liver disease. 4. Change in bowels, minor bleeding;  Colonoscopy [place] 2014: The colonic mucosa was slightly erythematous throughout. Biopsies were taken randomly and sent to pathology. There were multiple diverticulum in the left colon. The terminal ileum was normal. No polyps or cancers. Pathology suggested lymphocytic colitis. Started on budesonide 3 pills daily. 5. Abdominal ultrasound showing cirrhosis 09/14/16  Chief Complaint: Cirrhosis, diarrhea  HPI:  Kelly [name] is a 66 year old Caucasian female with a past medical history of anxiety, diabetes, fatty liver, shortness of breath, thrombocytopenia and others listed below, who was referred to me by [name], MD for follow-up of her cirrhosis and diarrhea .     Patient has previously been followed in our clinic by Dr. [name] and was last seen in office 01/13/13. At that time there was discussion regarding her recent diagnosis of possible lymphocytic colitis. Apparently she had tried to start budesonide but had developed tachycardia and stopped this medication and had continued with 6-7 watery stools per day. At that time she was prescribed a daily mesalamine as well as some Mesalamine suppositories due to some rectal discomfort. Previous to this the patient was using Imodium when necessary. As above, abnormal liver tests were noted in July 2010 and were thought  likely from [place] according to blood work at that time as above.   Patient was recently admitted to the hospital 09/14/16 for encephalopathy, though this was thought due to polypharmacy and miss-taking medications. There was also a question of dementia. Abdominal ultrasound during that admission showed suspected cirrhosis with no focal hepatic lesion and splenomegaly. Most recent labs from 09/15/16 showed a hemoglobin minimally decreased at 11.1 and platelets low at 74 which appears to be the patient's baseline. CMP was normal. Iron studies were also normal.   Today, the patient tells me that she was told to follow with our clinic regarding her cirrhosis. The patient explains to me the history of her encephalopathy and how she mistakenly took different medications which left her feeling confused. Apparently she has been undergoing various workups from psychologists and neurologist in order to figure if she has baseline dementia. She has not felt the way she did when she went to the hospital since being discharged. She does tell me that she was minimally aware of liver disease previously, but is new to the diagnosis of cirrhosis. She denies any peripheral edema or abdominal distention.   The patient also discusses today that she continues with intermittent diarrhea, though this is not "a big problem for me". Patient tells me that when she does have diarrhea episodes they are quite urgent, so if she knows that she is going somewhere she will take an Imodium in the morning just to prevent the possibility. But typically she will have some normal solid stools mixed in with some loose diarrhea. She also occasionally sees some faint red blood on the toilet paper after wiping. Patient denies ever having started on mesalamine either rectally or orally.   The patient does  describe a left-sided discomfort which has been occurring over the past week or so making it hard for the patient to sleep as "I normally lay on that  side". Apparently the only time the patient typically notices this pain is when she is lying on her side, if she does not do this she has no pain. Most recently she tried taking some Aleve which helped some and she has been trying to avoid this position.   Patient denies fever, chills, melena, weight loss, fatigue, anorexia, weight gain, nausea, vomiting, heartburn, reflux or symptoms of increased gas or bloating.     Past Medical History:  Diagnosis Date  . Adenomatous polyp   . Anxiety   . Arrhythmia   . Diabetes ([place])   . Diverticulosis   . Fatty liver   . Hyperlipidemia   . Hypertension   . NASH (nonalcoholic steatohepatitis)   . Obesity   . SOB (shortness of breath)   . Thrombocytopenia ([place])   . Thyroid disease   . Vitamin D deficiency     Past Surgical History:  Procedure Laterality Date  . oopherectomy    . OVARIAN CYST REMOVAL      Current Outpatient Prescriptions  Medication Sig Dispense Refill  . ACCU-CHEK AVIVA PLUS test strip     . ACCU-CHEK SOFTCLIX LANCETS lancets     . Blood Glucose Monitoring Suppl (ACCU-CHEK AVIVA PLUS) w/Device KIT     . glimepiride (AMARYL) 1 MG tablet Take 1 tablet (1 mg total) by mouth daily with breakfast. 90 tablet 1  . levothyroxine (SYNTHROID, LEVOTHROID) 75 MCG tablet Take 1 tablet (75 mcg total) by mouth daily. 90 tablet 3  . losartan-hydrochlorothiazide (HYZAAR) 100-12.5 MG tablet Take 1 tablet by mouth daily. 90 tablet 3  . metFORMIN (GLUCOPHAGE-XR) 500 MG 24 hr tablet Take 2 tablets (1,000 mg total) by mouth 2 (two) times daily. 360 tablet 1  . metoprolol tartrate (LOPRESSOR) 25 MG tablet Take 0.5 tablets (12.5 mg total) by mouth 2 (two) times daily. 180 tablet 3  . vortioxetine HBr (TRINTELLIX) 10 MG TABS Take by mouth.    . zolpidem (AMBIEN) 5 MG tablet Take 1 tablet (5 mg total) by mouth at bedtime as needed for sleep. 30 tablet 0   No current facility-administered medications for this visit.     Allergies as of 12/18/2016  - Review Complete 12/18/2016  Allergen Reaction Noted  . Celecoxib    . Prednisone Itching 01/13/2013  . Macrodantin [nitrofurantoin macrocrystal] Rash 06/03/2016  . Penicillins Hives 06/03/2016    Family History  Problem Relation Age of Onset  . Lung cancer Father   . Heart disease Father   . Alcohol abuse Father   . Alzheimer's disease Mother   . Dementia Mother   . Other Mother        twisted colon   . Stomach cancer Neg Hx   . Colon cancer Neg Hx     Social History   Social History  . Marital status: Married    Spouse name: N/A  . Number of children: N/A  . Years of education: N/A   Occupational History  . Not on file.   Social History Main Topics  . Smoking status: Current Every Day Smoker    Packs/day: 0.50    Types: Cigarettes  . Smokeless tobacco: Never Used  . Alcohol use No  . Drug use: No  . Sexual activity: Not Currently   Other Topics Concern  . Not on file  Social History Narrative  . No narrative on file    Review of Systems:    Constitutional: No weight loss, fever or chills Skin: No rash or itching Cardiovascular: No chest pain Respiratory: No SOB Gastrointestinal: See HPI and otherwise negative Genitourinary: No dysuria Neurological: No headache Musculoskeletal: No new muscle or joint pain Hematologic: No bleeding Psychiatric: Positive history of anxiety and depression   Physical Exam:  Vital signs: BP (!) 144/80   Pulse (!) 58   Ht 5' 5"  (1.651 m)   Wt 207 lb (93.9 kg)   BMI 34.45 kg/m   Constitutional:   Pleasant Overweight Caucasian female appears to be in NAD, Well developed, Well nourished, alert and cooperative Head:  Normocephalic and atraumatic. Eyes:   PEERL, EOMI. No icterus. Conjunctiva pink. Ears:  Normal auditory acuity. Neck:  Supple Throat: Oral cavity and pharynx without inflammation, swelling or lesion.  Respiratory: Respirations even and unlabored. Lungs clear to auscultation bilaterally.   No wheezes,  crackles, or rhonchi.  Cardiovascular: Normal S1, S2. No MRG. Regular rate and rhythm. No peripheral edema, cyanosis or pallor.  Gastrointestinal:  Soft, nondistended, mild tenderness in left upper quadrant/over rib cage No rebound or guarding. Normal bowel sounds. No appreciable masses or hepatomegaly. Rectal:  Not performed.  Msk:  Symmetrical without gross deformities. Without edema, no deformity or joint abnormality.  Neurologic:  Alert and  oriented x4;  grossly normal neurologically.  Skin:   Dry and intact without significant lesions or rashes. Psychiatric: Demonstrates good judgement and reason without abnormal affect or behaviors.  MOST RECENT LABS AND IMAGING: CBC    Component Value Date/Time   WBC 6.2 09/15/2016 0442   RBC 3.99 09/15/2016 0838   RBC 3.98 09/15/2016 0442   HGB 11.1 (L) 09/15/2016 0442   HCT 32.4 (L) 09/15/2016 0442   HCT 32.2 (L) 09/15/2016 0442   PLT 74 (L) 09/15/2016 0442   MCV 80.9 09/15/2016 0442   MCH 27.9 09/15/2016 0442   MCHC 34.5 09/15/2016 0442   RDW 14.8 09/15/2016 0442   LYMPHSABS 2.4 09/14/2016 1622   MONOABS 0.7 09/14/2016 1622   EOSABS 0.1 09/14/2016 1622   BASOSABS 0.0 09/14/2016 1622    CMP     Component Value Date/Time   NA 142 09/15/2016 0442   K 3.5 09/15/2016 0442   CL 109 09/15/2016 0442   CO2 27 09/15/2016 0442   GLUCOSE 86 09/15/2016 0442   BUN 10 09/15/2016 0442   CREATININE 0.66 09/15/2016 0442   CALCIUM 9.0 09/15/2016 0442   PROT 6.8 09/15/2016 0442   ALBUMIN 3.6 09/15/2016 0442   AST 29 09/15/2016 0442   ALT 15 09/15/2016 0442   ALKPHOS 78 09/15/2016 0442   BILITOT 0.8 09/15/2016 0442   GFRNONAA >60 09/15/2016 0442   GFRAA >60 09/15/2016 0442   EXAM: ABDOMEN ULTRASOUND COMPLETE 09/15/16  COMPARISON:  03/03/2009  FINDINGS: Gallbladder: No gallstones, gallbladder wall thickening, or pericholecystic fluid.  Common bile duct: Diameter: 4 mm  Liver: Coarse hepatic echotexture with mildly nodular  hepatic contour of the left hepatic lobe (image 32), raising the possibility of mild cirrhosis. No focal hepatic lesion is seen.  IVC: No abnormality visualized.  Pancreas: Visualized portion unremarkable.  Spleen: Enlarged, measuring 12.1 x 15.1 x 7.6 cm (calculated volume 723 mL).  Right Kidney: Length: 12.0 cm.  No mass or hydronephrosis.  Left Kidney: Length: 11.6 cm.  No mass or hydronephrosis.  Abdominal aorta: No aneurysm visualized.  Other findings: None.  IMPRESSION: Suspected cirrhosis.  No focal hepatic lesion is seen.  Splenomegaly (calculated volume 723 mL).   Electronically Signed   By: Julian Hy M.D.   On: 09/15/2016 10:11  Assessment: 1. Kelly [name] cirrhosis with thrombocytopenia: Patient had imaging suggesting cirrhosis, this is new for her although she has had abnormal liver enzymes thought due to [place] in the past, platelets remain low at her baseline around 74, no other symptoms at this time, liver enzymes normal in February 2. Diarrhea: Thought due to possible lymphocytic colitis in the past, patient doing well on Imodium when necessary  Plan: 1. Currently the patient seems stable, she has not had labs since February, we will reorder CBC, CMP, PT and INR today 2. Patient did have recent imaging in February, this will not be due until August for [facility] screening 3. Advised the patient to maintain a low sodium diet, less than 2 g per day 4. Recommend the patient avoid hepatotoxic substances including alcohol 5. Patient to continue her when necessary Imodium as this seems to be working for her 6. Discussed with the patient that if she has an increase in diarrhea or a change in bowel habits including any more blood in her stools she should contact our office, at that time [facility] need to discuss a colonoscopy 7. Patient to follow in clinic in 4-6 months with Dr. Jerilynn Birkenhead, PA-C [facility] 12/18/2016, 11:12 AM  Cc:  [name], MD

## 2016-12-19 ENCOUNTER — Ambulatory Visit (HOSPITAL_COMMUNITY): Payer: Self-pay | Admitting: Psychiatry

## 2016-12-20 ENCOUNTER — Other Ambulatory Visit (HOSPITAL_COMMUNITY): Payer: Self-pay

## 2016-12-20 DIAGNOSIS — F330 Major depressive disorder, recurrent, mild: Secondary | ICD-10-CM

## 2016-12-20 MED ORDER — ZOLPIDEM TARTRATE 5 MG PO TABS: 5.0000 mg | ORAL_TABLET | Freq: Every evening | ORAL | 0 refills | Status: DC | PRN

## 2017-01-07 ENCOUNTER — Encounter (HOSPITAL_COMMUNITY): Payer: Self-pay | Admitting: Psychiatry

## 2017-01-07 ENCOUNTER — Ambulatory Visit (INDEPENDENT_AMBULATORY_CARE_PROVIDER_SITE_OTHER): Payer: Medicare HMO | Admitting: Psychiatry

## 2017-01-07 VITALS — BP 128/78 | HR 60 | Ht 65.0 in | Wt 211.4 lb

## 2017-01-07 DIAGNOSIS — R6889 Other general symptoms and signs: Secondary | ICD-10-CM | POA: Diagnosis not present

## 2017-01-07 DIAGNOSIS — F17210 Nicotine dependence, cigarettes, uncomplicated: Secondary | ICD-10-CM

## 2017-01-07 DIAGNOSIS — Z810 Family history of intellectual disabilities: Secondary | ICD-10-CM

## 2017-01-07 DIAGNOSIS — F330 Major depressive disorder, recurrent, mild: Secondary | ICD-10-CM | POA: Diagnosis not present

## 2017-01-07 DIAGNOSIS — Z811 Family history of alcohol abuse and dependence: Secondary | ICD-10-CM

## 2017-01-07 MED ORDER — VORTIOXETINE HBR 20 MG PO TABS: 20.0000 mg | ORAL_TABLET | Freq: Every day | ORAL | 0 refills | Status: DC

## 2017-01-07 MED ORDER — ZOLPIDEM TARTRATE 5 MG PO TABS: 5.0000 mg | ORAL_TABLET | Freq: Every evening | ORAL | 1 refills | Status: DC | PRN

## 2017-01-07 NOTE — Progress Notes (Signed)
BH MD/PA/NP OP Progress Note  01/07/2017 11:07 AM Kelly [name]  MRN:  [id number]  Chief Complaint:  Subjective:  I still feel depressed and anxious.  HPI: Patient is 66 year old Caucasian, married unemployed female who was seen first time on April 20 for the management of depression and anxiety symptoms.  She was referred from her primary care physician.  In February she was admitted on a medical floor off or taking extra dose of Xanax with Ambien.  She was confused and seen by psychiatry.  Patient has multiple stressors and major concern is her husband.  Her husband has knee amputation and she is a primary caretaker.  Patient endorse some time burnout.  Lately she is feeling easily fatigued, tired, exhausted and having poor sleep.  She was prescribed Trintellix by her primary care physician and we added low-dose Ambien to help her sleep.  She sleeping 2-3 hours but she still feel very sad depressed and hopeless.  She endorse having arguments with her husband.  Patient told her husband is very demanding and gets easily irritable when his demands are not met.  However there were no physical abuse or altercation.  Her husband finally got the prosthesis but due to infection unable to use it.  Patient also concerned about her own health.  Recently she had abdominal pain and she seen GI specialist.  She has diarrhea and she was given Imodium.  She has mild cognitive impairment and she scheduled to see neurologist on 12th.  The patient denies any suicidal thoughts or homicidal thought but endorsed fatigue, lack of energy, motivation, crying spells, decreased energy and concentration.  She does not want to change her antidepressant.  Patient denies drinking alcohol or using any illegal substances.  Her appetite is okay.  Her vital signs are stable.  Patient has son who lives close by but he has minimal involvement in taking care of patient's husband.  Due to transportation and financial problems she is unable to see  therapist.  Visit Diagnosis:    ICD-9-CM ICD-10-CM   1. 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 She was hospitalized in [place] on a medical floor due to change mental status after taking extra dose of Xanax and Ambien.  In the past she had seen Dr. Toy Care who prescribe Ambien which worked for many years.  Her primary care physician tried her on Paxil, Wellbutrin, Zoloft with limited response.  She remember Wellbutrin causes increased energy.  She also tried trazodone and Jerrye Noble but they did not work.  Patient denies any history of suicidal attempt, psychosis, hallucination, mania or any self abusive behavior.  Past Medical History:  Past Medical History:  Diagnosis Date  . Adenomatous polyp   . Anxiety   . Arrhythmia   . Diabetes ([place])   . Diverticulosis   . Fatty liver   . Hyperlipidemia   . Hypertension   . NASH (nonalcoholic steatohepatitis)   . Obesity   . SOB (shortness of breath)   . Thrombocytopenia ([place])   . Thyroid disease   . Vitamin D deficiency     Past Surgical History:  Procedure Laterality Date  . oopherectomy    . OVARIAN CYST REMOVAL      Family Psychiatric History: Reviewed.  Family History:  Family History  Problem Relation Age of Onset  . Lung cancer Father   .  Heart disease Father   . Alcohol abuse Father   . Alzheimer's disease Mother   . Dementia Mother   . Other Mother        twisted colon   . Stomach cancer Neg Hx   . Colon cancer Neg Hx     Social History:  Social History   Social History  . Marital status: Married    Spouse name: N/A  . Number of children: N/A  . Years of education: N/A   Social History Main Topics  . Smoking status: Current Every Day Smoker    Packs/day: 0.50    Types: Cigarettes  . Smokeless tobacco: Never Used  . Alcohol use No  . Drug use: No  . Sexual  activity: Not Currently   Other Topics Concern  . Not on file   Social History Narrative  . No narrative on file    Allergies:  Allergies  Allergen Reactions  . Celecoxib   . Prednisone Itching    Heart rate increased  . Macrodantin [Nitrofurantoin Macrocrystal] Rash  . Penicillins Hives    Per family    Metabolic Disorder Labs: Recent Results (from the past 2160 hour(s))  CBC w/Diff     Status: Abnormal   Collection Time: 12/18/16 10:15 AM  Result Value Ref Range   WBC 10.1 4.0 - 10.5 K/uL   RBC 4.47 3.87 - 5.11 Mil/uL   Hemoglobin 12.5 12.0 - 15.0 g/dL   HCT 36.2 36.0 - 46.0 %   MCV 81.0 78.0 - 100.0 fl   MCHC 34.5 30.0 - 36.0 g/dL   RDW 15.5 11.5 - 15.5 %   Platelets 126.0 (L) 150.0 - 400.0 K/uL   Neutrophils Relative % 58.6 43.0 - 77.0 %   Lymphocytes Relative 28.7 12.0 - 46.0 %   Monocytes Relative 8.2 3.0 - 12.0 %   Eosinophils Relative 3.6 0.0 - 5.0 %   Basophils Relative 0.9 0.0 - 3.0 %   Neutro Abs 5.9 1.4 - 7.7 K/uL   Lymphs Abs 2.9 0.7 - 4.0 K/uL   Monocytes Absolute 0.8 0.1 - 1.0 K/uL   Eosinophils Absolute 0.4 0.0 - 0.7 K/uL   Basophils Absolute 0.1 0.0 - 0.1 K/uL  Comp Met (CMET)     Status: Abnormal   Collection Time: 12/18/16 10:15 AM  Result Value Ref Range   Sodium 140 135 - 145 mEq/L   Potassium 3.8 3.5 - 5.1 mEq/L   Chloride 107 96 - 112 mEq/L   CO2 26 19 - 32 mEq/L   Glucose, Bld 137 (H) 70 - 99 mg/dL   BUN 11 6 - 23 mg/dL   Creatinine, Ser 0.78 0.40 - 1.20 mg/dL   Total Bilirubin 0.7 0.2 - 1.2 mg/dL   Alkaline Phosphatase 84 39 - 117 U/L   AST 17 0 - 37 U/L   ALT 11 0 - 35 U/L   Total Protein 7.3 6.0 - 8.3 g/dL   Albumin 3.9 3.5 - 5.2 g/dL   Calcium 9.4 8.4 - 10.5 mg/dL   GFR 78.02 >60.00 mL/min  INR/PT     Status: Abnormal   Collection Time: 12/18/16 10:15 AM  Result Value Ref Range   INR 1.1 (H) 0.8 - 1.0 ratio   Prothrombin Time 11.6 9.6 - 13.1 sec   Lab Results  Component Value Date   HGBA1C 5.4 09/15/2016   MPG 108  09/15/2016   No results found for: PROLACTIN No results found for: CHOL, TRIG, HDL, CHOLHDL, VLDL, LDLCALC  Current Medications: Current Outpatient Prescriptions  Medication Sig Dispense Refill  . ACCU-CHEK AVIVA PLUS test strip     . ACCU-CHEK SOFTCLIX LANCETS lancets     . Blood Glucose Monitoring Suppl (ACCU-CHEK AVIVA PLUS) w/Device KIT     . glimepiride (AMARYL) 1 MG tablet Take 1 tablet (1 mg total) by mouth daily with breakfast. 90 tablet 1  . levothyroxine (SYNTHROID, LEVOTHROID) 75 MCG tablet Take 1 tablet (75 mcg total) by mouth daily. 90 tablet 3  . losartan-hydrochlorothiazide (HYZAAR) 100-12.5 MG tablet Take 1 tablet by mouth daily. 90 tablet 3  . metFORMIN (GLUCOPHAGE-XR) 500 MG 24 hr tablet Take 2 tablets (1,000 mg total) by mouth 2 (two) times daily. 360 tablet 1  . metoprolol tartrate (LOPRESSOR) 25 MG tablet Take 0.5 tablets (12.5 mg total) by mouth 2 (two) times daily. 180 tablet 3  . vortioxetine HBr (TRINTELLIX) 10 MG TABS Take by mouth.    . zolpidem (AMBIEN) 5 MG tablet Take 1 tablet (5 mg total) by mouth at bedtime as needed for sleep. 30 tablet 0   No current facility-administered medications for this visit.     Neurologic: Headache: No Seizure: No Paresthesias: No  Musculoskeletal: Strength & Muscle Tone: within normal limits Gait & Station: normal Patient leans: N/A  Psychiatric Specialty Exam: Review of Systems  Constitutional:       Tired  HENT: Negative.   Respiratory: Negative.   Cardiovascular: Negative.   Gastrointestinal: Positive for abdominal pain.  Genitourinary: Negative.   Musculoskeletal: Negative.   Skin: Negative.  Negative for itching and rash.  Neurological: Negative.   Psychiatric/Behavioral: Positive for depression. The patient is nervous/anxious and has insomnia.     Blood pressure 128/78, pulse 60, height _0  (1.651 m), weight 211 lb 6.4 oz (95.9 kg).There is no height or weight on file to calculate BMI.  General  Appearance: Casual  Eye Contact:  Good  Speech:  Clear and Coherent  Volume:  Decreased  Mood:  Anxious, Depressed and Dysphoric  Affect:  Constricted and Depressed  Thought Process:  Goal Directed  Orientation:  Full (Time, Place, and Person)  Thought Content: Logical and Rumination   Suicidal Thoughts:  No  Homicidal Thoughts:  No  Memory:  Immediate;   Fair Recent;   Fair Remote;   Fair  Judgement:  Good  Insight:  Good  Psychomotor Activity:  Normal  Concentration:  Concentration: Fair and Attention Span: Fair  Recall:  Good  Fund of Knowledge: Good  Language: Good  Akathisia:  No  Handed:  Right  AIMS (if indicated):  0  Assets:  Communication Skills Desire for Improvement Housing Resilience  ADL's:  Intact  Cognition: Impaired,  Mild  Sleep:  Fair    Assessment: Major depressive disorder, recurrent mild.  History of benzodiazepine accidental overdose  Plan: I review her records and collateral information from other providers.  Her sleep is slightly improved from the past but she still feels anxious depressed.  She does not want to change her antidepressant however I recommended to increase Trintellix 20 mg daily.  If patient do not see any improvement and we will consider switching to Lexapro or Prozac.  I encouraged to keep appointment with neurologist.  I also offered counseling but patient declined due to transportation and financial problems.  Discuss safety concern that anytime having active suicidal thoughts or homicidal thoughts and she need to call 911 or go to the local emergency room.  Patient feels safe living in her  house.  I also encouraged to have her son comes and helps her.  Patient is tolerating the medication and reported no side effects.  Continue Ambien 5 mg daily.  Discussed benzodiazepine and hypnotics abuse dependence and tolerance.  Follow-up in 2 months. Time spent 25 minutes.  More than 50% of the time spent in psychoeducation, counseling and  coordination of care.  Discuss safety plan that anytime having active suicidal thoughts or homicidal thoughts then patient need to call 911 or go to the local emergency room.    Chaselyn Nanney T., MD 01/07/2017, 11:07 AM

## 2017-01-14 ENCOUNTER — Ambulatory Visit: Payer: Medicare HMO | Admitting: Neurology

## 2017-01-16 ENCOUNTER — Ambulatory Visit (INDEPENDENT_AMBULATORY_CARE_PROVIDER_SITE_OTHER): Payer: Medicare HMO | Admitting: Family Medicine

## 2017-01-16 ENCOUNTER — Ambulatory Visit (INDEPENDENT_AMBULATORY_CARE_PROVIDER_SITE_OTHER): Payer: Medicare HMO | Admitting: *Deleted

## 2017-01-16 ENCOUNTER — Encounter: Payer: Self-pay | Admitting: Family Medicine

## 2017-01-16 VITALS — BP 130/70 | HR 76 | Temp 98.5°F | Resp 18 | Ht 64.5 in | Wt 211.0 lb

## 2017-01-16 DIAGNOSIS — R0781 Pleurodynia: Secondary | ICD-10-CM

## 2017-01-16 DIAGNOSIS — R6889 Other general symptoms and signs: Secondary | ICD-10-CM | POA: Diagnosis not present

## 2017-01-16 DIAGNOSIS — D696 Thrombocytopenia, unspecified: Secondary | ICD-10-CM

## 2017-01-16 MED ORDER — CYANOCOBALAMIN 1000 MCG/ML IJ SOLN
1000.0000 ug | Freq: Once | INTRAMUSCULAR | Status: AC
  Administered 2017-01-16: 1000 ug via INTRAMUSCULAR

## 2017-01-16 NOTE — Progress Notes (Signed)
Subjective:    Patient ID: Kelly [name], female    DOB: 1951/06/22, 66 y.o.   MRN: [id number]  Insomnia     10/2016 Patient is a new patient here today with her son to establish care. Apparently she was recently admitted to the hospital with altered mental status. She was diagnosed with benzodiazepine intoxication and underlying dementia. She is here today for follow-up. She also apparently had low B12 levels which was confirmed in the hospital and she's been taking B12 tablets. Patient states that she is always been extremely anxious to stay at home alone. Recently her husband has been in a rehabilitation facility after surgery. Therefore she was taking Xanax prescribed by her previous primary care provider in addition to Ambien which she will take at night to help her sleep. Apparently she has been on Ambien for many many years due to chronic insomnia. Her son was unable to get her to answer the door one morning and broke in the back door to find his mother extremely intoxicated and confused. She was taken to the hospital. Their CAT scan showed mild diffuse cortical thickening. Ammonia levels were normal. RPR was normal. TSH was normal. Hemoglobin A1c was 5.4 PA B12 level was low at less than 200. Otherwise workup was unremarkable aside from some thrombocytopenia which is chronic and a mild anemia presumed to be secondary to B12 deficiency and iron deficiency. Since discharge from the hospital she is not taking any further Xanax. Her son has also not allowed her to have Ambien over the last month. She is here today for evaluation. I performed a Mini-Mental status exam. The patient is easily able to score is 5 out of 5. She also easily tells me the location. She is able to remember 3 objects easily. She can spell world in reverse and perform serial sevens. However the patient does have a noticeable word finding difficulty just in general communication. She has a difficult time remembering the names of  objects and remembering what she's trying to tell me. On clock drawing assessment, the patient is able to correctly draw the clock and the time. Takes for a prolonged period of time to do this. I then performed a [place] cognitive assessment and the patient had an extremely difficult time performing the pattern test alternating between letters and numbers in order. Even after I explained to the patient she has a difficult time drawing the areas appropriately. Therefore it appears that she has some mild dementia with some mild anomia.  Patient is requesting a refill on her Ambien.  She also has a history of lymphocytic colitis previously managed by Dr. [name]. She has not seen him in quite some time and she is not taking that the desonide that he had previously prescribed. As a result she has frequent diarrhea which could be contributing to her B12 deficiency the fact of malabsorptive diarrhea. She also has underlying history of nonalcoholic steatohepatitis complicated by thrombocytopenia. Platelet counts in the hospital between 70 and 80. She has a mild anemia with hemoglobin between 11 and 12. Her diabetes thankfully was well controlled at 5.4 and her TSH was within normal limits.  At that time, my plan was: The patient's memory loss was likely multifactorial. Given the cortical thickening on CT scan and some of the findings demonstrated today on her cognitive assessment she appears to have a very mild dementia. It is difficult for me to gauge this given the fact I am just meeting the patient today. I  believe this is likely exacerbated by the combination of benzodiazepines which she [place] have taken several and her Ambien. This could also be affected by her B12 deficiency. Therefore I recommended that we treat the patient's B12 deficiency with B12 injections parenterally 1000 g every month as I'm concerned that she does not absorb B12 well to the gastrointestinal tract due to her lymphocytic colitis. I've also  recommended that she start iron replacement. I spent more than 45 minutes with this patient discussing her diagnosis and evaluating her. I strongly recommended that we keep her away from any medication that can exacerbate her memory particularly benzodiazepines and Ambien. I believe a lot of her problems are exacerbated by anxiety. Therefore I recommended neuropsychological consultation to determine medication options. Perhaps trazodone would be a better option for this patient rather than Ambien. I would like to see her back in one month to discuss and follow-up. I refilled her other medications. No lab work was obtained at this visit but I reviewed the battery of lab work that was obtained at her most recent hospitalization:  11/07/16 Patient is under tremendous stress. Her husband is battling congestive heart failure and recently underwent an amputation. She states that he is constantly unloading his burdens and complaints onto her. However he is in denial about her medical problems. She feels very little support. She continues to battle anxiety and insomnia. She reports poor concentration, depression, anhedonia. She has very little energy. In the past she is tried and failed Zoloft, Wellbutrin, Prozac," several others". She denies any suicidal ideation. She denies any hallucinations or delusions. She is wanting to try Xanax again to help her sleep. Please see the previous office visit we discussed the problems of Xanax created for this patient.  At that time, my plan was: I spent more than 25 minutes with this patient discussing her problems. I do believe she has mild dementia but also believe she could be suffering from depression. I would like to try the patient on Trintellix 5 mg a day for 1 week and gradually increased to 10 mg a day. Recheck in one month to see if there is benefit. Try to avoid high-risk medications such as benzodiazepines. She can continue use the trazodone as needed for  sleep.   01/16/17 Patient states that she has had pain secondary to "colitis" since Mother's Day. She has a history of lymphocytic colitis. She does intermittently have left lower quadrant crampy abdominal pain and copious diarrhea. She manages this on an as-needed basis with Imodium. However over the last 3-4 weeks, she has had near daily constant pain in her left upper abdomen on the lower left ribs. Pain is reproducible with palpation of the anterior left rib, the left ribs in the axillary line, and the left ribs in the back. She jumps due to the tenderness when I applied the pressure. She denies any pleurisy. She denies any hemoptysis. She denies any fever. She does have some shortness of breath with activity. She denies any injury.  She states that it hurts to lay on her ribs in that area. It exacerbates the pain. The pain radiates around to her back along the border of the rib Past Medical History:  Diagnosis Date  . Adenomatous polyp   . Anxiety   . Arrhythmia   . Diabetes ([place])   . Diverticulosis   . Fatty liver   . Hyperlipidemia   . Hypertension   . NASH (nonalcoholic steatohepatitis)   . Obesity   .  SOB (shortness of breath)   . Thrombocytopenia ([place])   . Thyroid disease   . Vitamin D deficiency    Past Surgical History:  Procedure Laterality Date  . oopherectomy    . OVARIAN CYST REMOVAL     Current Outpatient Prescriptions on File Prior to Visit  Medication Sig Dispense Refill  . ACCU-CHEK AVIVA PLUS test strip     . ACCU-CHEK SOFTCLIX LANCETS lancets     . Blood Glucose Monitoring Suppl (ACCU-CHEK AVIVA PLUS) w/Device KIT     . levothyroxine (SYNTHROID, LEVOTHROID) 75 MCG tablet Take 1 tablet (75 mcg total) by mouth daily. 90 tablet 3  . losartan-hydrochlorothiazide (HYZAAR) 100-12.5 MG tablet Take 1 tablet by mouth daily. 90 tablet 3  . metFORMIN (GLUCOPHAGE-XR) 500 MG 24 hr tablet Take 2 tablets (1,000 mg total) by mouth 2 (two) times daily. 360 tablet 1  .  metoprolol tartrate (LOPRESSOR) 25 MG tablet Take 0.5 tablets (12.5 mg total) by mouth 2 (two) times daily. 180 tablet 3  . vortioxetine HBr (TRINTELLIX) 20 MG TABS Take 20 mg by mouth daily. 60 mg 0  . zolpidem (AMBIEN) 5 MG tablet Take 1 tablet (5 mg total) by mouth at bedtime as needed for sleep. 30 tablet 1   No current facility-administered medications on file prior to visit.    Allergies  Allergen Reactions  . Celecoxib   . Prednisone Itching    Heart rate increased  . Macrodantin [Nitrofurantoin Macrocrystal] Rash  . Penicillins Hives    Per family   Social History   Social History  . Marital status: Married    Spouse name: N/A  . Number of children: N/A  . Years of education: N/A   Occupational History  . Not on file.   Social History Main Topics  . Smoking status: Current Every Day Smoker    Packs/day: 0.50    Types: Cigarettes  . Smokeless tobacco: Never Used  . Alcohol use No  . Drug use: No  . Sexual activity: Not Currently   Other Topics Concern  . Not on file   Social History Narrative  . No narrative on file   Family History  Problem Relation Age of Onset  . Lung cancer Father   . Heart disease Father   . Alcohol abuse Father   . Alzheimer's disease Mother   . Dementia Mother   . Other Mother        twisted colon   . Stomach cancer Neg Hx   . Colon cancer Neg Hx       Review of Systems  Psychiatric/Behavioral: The patient has insomnia.   All other systems reviewed and are negative.      Objective:   Physical Exam  Constitutional: She is oriented to person, place, and time. She appears well-developed and well-nourished.  HENT:  Right Ear: External ear normal.  Left Ear: External ear normal.  Nose: Nose normal.  Mouth/Throat: Oropharynx is clear and moist. No oropharyngeal exudate.  Eyes: Conjunctivae and EOM are normal. Pupils are equal, round, and reactive to light.  Neck: Neck supple. No JVD present. No thyromegaly present.   Cardiovascular: Normal rate, regular rhythm and normal heart sounds.   No murmur heard. Pulmonary/Chest: Effort normal and breath sounds normal. No respiratory distress. She has no wheezes. She has no rales. She exhibits tenderness.  Abdominal: Soft. Bowel sounds are normal. She exhibits no distension. There is no tenderness. There is no rebound and no guarding.  Musculoskeletal: She exhibits  no edema.  Lymphadenopathy:    She has no cervical adenopathy.  Neurological: She is alert and oriented to person, place, and time. She has normal reflexes. No cranial nerve deficit. She exhibits normal muscle tone. Coordination normal.  Psychiatric: She has a normal mood and affect. Her behavior is normal. Judgment and thought content normal.  Vitals reviewed.         Assessment & Plan:  Rib pain on left side - Plan: DG Chest 2 View, DG Ribs Unilateral Left  I explained to the patient that I do not believe her pain has anything to do with her lymphocytic colitis. The pain from her lymphocytic colitis should be crampy lower abdominal and associated with diarrhea. This is constant and reproducible with palpation and seems to be along the lower left ribs in the lower thoracic spine all the way to the xiphoid process. Therefore I will obtain a chest x-ray as well as dedicated rib films. Differential diagnosis includes a rib fracture, rib contusion, strain intercostal muscle, radicular pain due to thoracic nerve root impingement, or pulmonary pathology such as pleural effusion, etc. Obtain imaging of the chest and ribs. If negative, consider a thoracic MRI of the spine.

## 2017-01-30 ENCOUNTER — Ambulatory Visit
Admission: RE | Admit: 2017-01-30 | Discharge: 2017-01-30 | Disposition: A | Discharge status: Home / Self Care | Payer: Medicare HMO | Source: Ambulatory Visit | Attending: Family Medicine | Admitting: Family Medicine

## 2017-01-30 DIAGNOSIS — R0781 Pleurodynia: Secondary | ICD-10-CM | POA: Diagnosis not present

## 2017-01-30 DIAGNOSIS — R0789 Other chest pain: Secondary | ICD-10-CM | POA: Diagnosis not present

## 2017-01-30 DIAGNOSIS — R6889 Other general symptoms and signs: Secondary | ICD-10-CM | POA: Diagnosis not present

## 2017-02-25 ENCOUNTER — Other Ambulatory Visit: Payer: Self-pay | Admitting: Family Medicine

## 2017-03-12 ENCOUNTER — Ambulatory Visit (HOSPITAL_COMMUNITY): Payer: Self-pay | Admitting: Psychiatry

## 2017-03-14 ENCOUNTER — Ambulatory Visit (INDEPENDENT_AMBULATORY_CARE_PROVIDER_SITE_OTHER): Payer: Medicare HMO | Admitting: Family Medicine

## 2017-03-14 ENCOUNTER — Encounter: Payer: Self-pay | Admitting: Family Medicine

## 2017-03-14 VITALS — BP 128/74 | HR 64 | Temp 97.8°F | Resp 16 | Ht 64.5 in | Wt 213.0 lb

## 2017-03-14 DIAGNOSIS — K7581 Nonalcoholic steatohepatitis (NASH): Secondary | ICD-10-CM

## 2017-03-14 DIAGNOSIS — E119 Type 2 diabetes mellitus without complications: Secondary | ICD-10-CM | POA: Diagnosis not present

## 2017-03-14 DIAGNOSIS — E039 Hypothyroidism, unspecified: Secondary | ICD-10-CM | POA: Diagnosis not present

## 2017-03-14 DIAGNOSIS — K7460 Unspecified cirrhosis of liver: Secondary | ICD-10-CM

## 2017-03-14 DIAGNOSIS — I10 Essential (primary) hypertension: Secondary | ICD-10-CM | POA: Diagnosis not present

## 2017-03-14 NOTE — Progress Notes (Signed)
Subjective:    Patient ID: Kelly [name], female    DOB: 1951/06/22, 66 y.o.   MRN: [id number]  Insomnia     10/2016 Patient is a new patient here today with her son to establish care. Apparently she was recently admitted to the hospital with altered mental status. She was diagnosed with benzodiazepine intoxication and underlying dementia. She is here today for follow-up. She also apparently had low B12 levels which was confirmed in the hospital and she's been taking B12 tablets. Patient states that she is always been extremely anxious to stay at home alone. Recently her husband has been in a rehabilitation facility after surgery. Therefore she was taking Xanax prescribed by her previous primary care provider in addition to Ambien which she will take at night to help her sleep. Apparently she has been on Ambien for many many years due to chronic insomnia. Her son was unable to get her to answer the door one morning and broke in the back door to find his mother extremely intoxicated and confused. She was taken to the hospital. Their CAT scan showed mild diffuse cortical thickening. Ammonia levels were normal. RPR was normal. TSH was normal. Hemoglobin A1c was 5.4 PA B12 level was low at less than 200. Otherwise workup was unremarkable aside from some thrombocytopenia which is chronic and a mild anemia presumed to be secondary to B12 deficiency and iron deficiency. Since discharge from the hospital she is not taking any further Xanax. Her son has also not allowed her to have Ambien over the last month. She is here today for evaluation. I performed a Mini-Mental status exam. The patient is easily able to score is 5 out of 5. She also easily tells me the location. She is able to remember 3 objects easily. She can spell world in reverse and perform serial sevens. However the patient does have a noticeable word finding difficulty just in general communication. She has a difficult time remembering the names of  objects and remembering what she's trying to tell me. On clock drawing assessment, the patient is able to correctly draw the clock and the time. Takes for a prolonged period of time to do this. I then performed a [place] cognitive assessment and the patient had an extremely difficult time performing the pattern test alternating between letters and numbers in order. Even after I explained to the patient she has a difficult time drawing the areas appropriately. Therefore it appears that she has some mild dementia with some mild anomia.  Patient is requesting a refill on her Ambien.  She also has a history of lymphocytic colitis previously managed by Dr. [name]. She has not seen him in quite some time and she is not taking that the desonide that he had previously prescribed. As a result she has frequent diarrhea which could be contributing to her B12 deficiency the fact of malabsorptive diarrhea. She also has underlying history of nonalcoholic steatohepatitis complicated by thrombocytopenia. Platelet counts in the hospital between 70 and 80. She has a mild anemia with hemoglobin between 11 and 12. Her diabetes thankfully was well controlled at 5.4 and her TSH was within normal limits.  At that time, my plan was: The patient's memory loss was likely multifactorial. Given the cortical thickening on CT scan and some of the findings demonstrated today on her cognitive assessment she appears to have a very mild dementia. It is difficult for me to gauge this given the fact I am just meeting the patient today. I  believe this is likely exacerbated by the combination of benzodiazepines which she [place] have taken several and her Ambien. This could also be affected by her B12 deficiency. Therefore I recommended that we treat the patient's B12 deficiency with B12 injections parenterally 1000 g every month as I'm concerned that she does not absorb B12 well to the gastrointestinal tract due to her lymphocytic colitis. I've also  recommended that she start iron replacement. I spent more than 45 minutes with this patient discussing her diagnosis and evaluating her. I strongly recommended that we keep her away from any medication that can exacerbate her memory particularly benzodiazepines and Ambien. I believe a lot of her problems are exacerbated by anxiety. Therefore I recommended neuropsychological consultation to determine medication options. Perhaps trazodone would be a better option for this patient rather than Ambien. I would like to see her back in one month to discuss and follow-up. I refilled her other medications. No lab work was obtained at this visit but I reviewed the battery of lab work that was obtained at her most recent hospitalization:  11/07/16 Patient is under tremendous stress. Her husband is battling congestive heart failure and recently underwent an amputation. She states that he is constantly unloading his burdens and complaints onto her. However he is in denial about her medical problems. She feels very little support. She continues to battle anxiety and insomnia. She reports poor concentration, depression, anhedonia. She has very little energy. In the past she is tried and failed Zoloft, Wellbutrin, Prozac," several others". She denies any suicidal ideation. She denies any hallucinations or delusions. She is wanting to try Xanax again to help her sleep. Please see the previous office visit we discussed the problems of Xanax created for this patient.  AT that time, my plan was: I spent more than 25 minutes with this patient discussing her problems. I do believe she has mild dementia but also believe she could be suffering from depression. I would like to try the patient on Trintellix 5 mg a day for 1 week and gradually increased to 10 mg a day. Recheck in one month to see if there is benefit. Try to avoid high-risk medications such as benzodiazepines. She can continue use the trazodone as needed for  sleep.  03/14/17 Patient is here today for follow-up. I had asked the patient to return to recheck a fasting lipid panel, recheck her TSH, recheck her hemoglobin A1c to monitor the management of her diabetes, hypothyroidism, and to monitor for evidence of hyperlipidemia. However the patient upon arrival tells me that her depression is worse. Spent more than 30 minutes with the patient in discussion. He does not include her in any financial decisions. He does not seem to respect her take into consideration her desires or her plans.  He also apparently had an affair for many years and has never asked for forgiveness. They have never discussed this normal had any kind of resolution regarding this. I believe that her stressors in her marriage are contributing to her depression. From the standpoint of her dementia, she has very mild memory loss. Just talking to the patient, is difficult to notice. However she stopped coming for her B12 injections and I believe is due to the fact that she forgot them. She states that she didn't and that she was confused on how often she needed to come. I recommended that she come once a month moving forward for B12 injections. Her blood pressure today is adequately controlled.  She denies  any hypoglycemia. She denies any polyuria, polydipsia, or blurred vision. She denies any tachycardia or palpitations. She denies any substantial changes in her weight  Past Medical History:  Diagnosis Date  . Adenomatous polyp   . Anxiety   . Arrhythmia   . Diabetes ([place])   . Diverticulosis   . Fatty liver   . Hyperlipidemia   . Hypertension   . NASH (nonalcoholic steatohepatitis)   . Obesity   . SOB (shortness of breath)   . Thrombocytopenia ([place])   . Thyroid disease   . Vitamin D deficiency    Past Surgical History:  Procedure Laterality Date  . oopherectomy    . OVARIAN CYST REMOVAL     Current Outpatient Prescriptions on File Prior to Visit  Medication Sig Dispense Refill   . ACCU-CHEK AVIVA PLUS test strip     . ACCU-CHEK SOFTCLIX LANCETS lancets     . Blood Glucose Monitoring Suppl (ACCU-CHEK AVIVA PLUS) w/Device KIT     . glimepiride (AMARYL) 1 MG tablet TAKE 1 TABLET EVERY DAY WITH BREAKFAST 90 tablet 1  . levothyroxine (SYNTHROID, LEVOTHROID) 75 MCG tablet Take 1 tablet (75 mcg total) by mouth daily. 90 tablet 3  . losartan-hydrochlorothiazide (HYZAAR) 100-12.5 MG tablet Take 1 tablet by mouth daily. 90 tablet 3  . metFORMIN (GLUCOPHAGE-XR) 500 MG 24 hr tablet TAKE 2 TABLETS TWICE DAILY 360 tablet 1  . metoprolol tartrate (LOPRESSOR) 25 MG tablet Take 0.5 tablets (12.5 mg total) by mouth 2 (two) times daily. 180 tablet 3  . traZODone (DESYREL) 50 MG tablet TAKE 1 TABLET (50 MG TOTAL) BY MOUTH AT BEDTIME AS NEEDED FOR SLEEP. 90 tablet 1  . vortioxetine HBr (TRINTELLIX) 20 MG TABS Take 20 mg by mouth daily. 60 mg 0  . zolpidem (AMBIEN) 5 MG tablet Take 1 tablet (5 mg total) by mouth at bedtime as needed for sleep. 30 tablet 1   No current facility-administered medications on file prior to visit.    Allergies  Allergen Reactions  . Celecoxib   . Prednisone Itching    Heart rate increased  . Macrodantin [Nitrofurantoin Macrocrystal] Rash  . Penicillins Hives    Per family   Social History   Social History  . Marital status: Married    Spouse name: N/A  . Number of children: N/A  . Years of education: N/A   Occupational History  . Not on file.   Social History Main Topics  . Smoking status: Current Every Day Smoker    Packs/day: 0.50    Types: Cigarettes  . Smokeless tobacco: Never Used  . Alcohol use No  . Drug use: No  . Sexual activity: Not Currently   Other Topics Concern  . Not on file   Social History Narrative  . No narrative on file   Family History  Problem Relation Age of Onset  . Lung cancer Father   . Heart disease Father   . Alcohol abuse Father   . Alzheimer's disease Mother   . Dementia Mother   . Other Mother         twisted colon   . Stomach cancer Neg Hx   . Colon cancer Neg Hx       Review of Systems  Psychiatric/Behavioral: The patient has insomnia.   All other systems reviewed and are negative.      Objective:   Physical Exam  Constitutional: She is oriented to person, place, and time. She appears well-developed and well-nourished.  HENT:  Right Ear: External ear normal.  Left Ear: External ear normal.  Nose: Nose normal.  Mouth/Throat: Oropharynx is clear and moist. No oropharyngeal exudate.  Eyes: Pupils are equal, round, and reactive to light. Conjunctivae and EOM are normal.  Neck: Neck supple. No JVD present. No thyromegaly present.  Cardiovascular: Normal rate, regular rhythm and normal heart sounds.   No murmur heard. Pulmonary/Chest: Effort normal and breath sounds normal. No respiratory distress. She has no wheezes. She has no rales.  Abdominal: Soft. Bowel sounds are normal. She exhibits no distension. There is no tenderness. There is no rebound and no guarding.  Musculoskeletal: She exhibits no edema.  Lymphadenopathy:    She has no cervical adenopathy.  Neurological: She is alert and oriented to person, place, and time. She has normal reflexes. No cranial nerve deficit. She exhibits normal muscle tone. Coordination normal.  Psychiatric: She has a normal mood and affect. Her behavior is normal. Judgment and thought content normal.  Vitals reviewed.         Assessment & Plan:  Type 2 diabetes mellitus without complication, without long-term current use of insulin (HCC) - Plan: COMPLETE METABOLIC PANEL WITH GFR, Lipid panel, Hemoglobin A1c, Microalbumin, urine  Liver cirrhosis secondary to NASH ([place]) - Plan: COMPLETE METABOLIC PANEL WITH GFR  Essential hypertension - Plan: COMPLETE METABOLIC PANEL WITH GFR, Lipid panel  Hypothyroidism, unspecified type - Plan: TSH  Blood pressure today is well controlled. I'll make no changes in her blood pressure medication. She  has a history of nonalcoholic steatohepatitis. Therefore I've asked the patient to return fasting for fasting lipid panel as well as a CMP to monitor for dyslipidemia and monitor her liver function test. At that time I'll also check a TSH to monitor management of her hypothyroidism. I would also like to check a hemoglobin A1c and a urine microalbumin. If her A1c stays less than 6.5, I would like to discontinue Amaryl due to the risk of hypoglycemia area and spent more than 30 minutes with the patient discussing her situation. Recommended marriage counseling. I believe that until they seek therapy and come to some type of understanding and forgiveness that she will harm her resentment that would contribute to her depression moving forward. I believe that she feels disrespected and a lack of love in the marriage and this is contributing to her depression.

## 2017-03-26 ENCOUNTER — Other Ambulatory Visit: Payer: Medicare HMO

## 2017-03-26 ENCOUNTER — Telehealth: Payer: Self-pay | Admitting: Family Medicine

## 2017-03-26 ENCOUNTER — Ambulatory Visit (INDEPENDENT_AMBULATORY_CARE_PROVIDER_SITE_OTHER): Payer: Medicare HMO | Admitting: Family Medicine

## 2017-03-26 DIAGNOSIS — K7460 Unspecified cirrhosis of liver: Secondary | ICD-10-CM | POA: Diagnosis not present

## 2017-03-26 DIAGNOSIS — E538 Deficiency of other specified B group vitamins: Secondary | ICD-10-CM | POA: Diagnosis not present

## 2017-03-26 DIAGNOSIS — E119 Type 2 diabetes mellitus without complications: Secondary | ICD-10-CM | POA: Diagnosis not present

## 2017-03-26 DIAGNOSIS — I10 Essential (primary) hypertension: Secondary | ICD-10-CM | POA: Diagnosis not present

## 2017-03-26 DIAGNOSIS — E039 Hypothyroidism, unspecified: Secondary | ICD-10-CM | POA: Diagnosis not present

## 2017-03-26 DIAGNOSIS — K7581 Nonalcoholic steatohepatitis (NASH): Secondary | ICD-10-CM | POA: Diagnosis not present

## 2017-03-26 LAB — TSH: TSH: 2.77 mIU/L

## 2017-03-26 MED ORDER — CYANOCOBALAMIN 1000 MCG/ML IJ SOLN
1000.0000 ug | INTRAMUSCULAR | Status: DC
  Administered 2017-03-26 – 2017-09-08 (×3): 1000 ug via SUBCUTANEOUS

## 2017-03-26 NOTE — Telephone Encounter (Signed)
Pt here for B12 injection.  While here she asked if she could get a jury duty note from you.  Also says she has been getting her Zolpidem from her mental health provider but wants to know if you can refill.  He won't refill without office visit and costing her a lot of co-pays.  Please advise about jury note and refill?

## 2017-03-26 NOTE — Telephone Encounter (Signed)
Pt left jury notice up at front desk.

## 2017-03-27 ENCOUNTER — Encounter: Payer: Self-pay | Admitting: Family Medicine

## 2017-03-27 LAB — COMPLETE METABOLIC PANEL WITH GFR
ALT: 9 U/L (ref 6–29)
AST: 17 U/L (ref 10–35)
Albumin: 3.7 g/dL (ref 3.6–5.1)
Alkaline Phosphatase: 88 U/L (ref 33–130)
BUN: 13 mg/dL (ref 7–25)
CHLORIDE: 106 mmol/L (ref 98–110)
CO2: 22 mmol/L (ref 20–32)
CREATININE: 0.94 mg/dL (ref 0.50–0.99)
Calcium: 9.2 mg/dL (ref 8.6–10.4)
GFR, Est African American: 72 mL/min (ref >=60)
GFR, Est Non African American: 63 mL/min (ref >=60)
GLUCOSE: 136 mg/dL — AB (ref 70–99)
POTASSIUM: 4.2 mmol/L (ref 3.5–5.3)
SODIUM: 142 mmol/L (ref 135–146)
Total Bilirubin: 0.8 mg/dL (ref 0.2–1.2)
Total Protein: 6.6 g/dL (ref 6.1–8.1)

## 2017-03-27 LAB — HEMOGLOBIN A1C
Hgb A1c MFr Bld: 5.8 % — ABNORMAL HIGH (ref <5.7)
MEAN PLASMA GLUCOSE: 120 mg/dL

## 2017-03-27 LAB — LIPID PANEL
CHOL/HDL RATIO: 3.6 ratio (ref <5.0)
Cholesterol: 191 mg/dL (ref <200)
HDL: 53 mg/dL (ref >50)
LDL CALC: 118 mg/dL — AB (ref <100)
Triglycerides: 101 mg/dL (ref <150)
VLDL: 20 mg/dL (ref <30)

## 2017-03-28 ENCOUNTER — Telehealth: Payer: Self-pay | Admitting: Family Medicine

## 2017-03-28 DIAGNOSIS — F330 Major depressive disorder, recurrent, mild: Secondary | ICD-10-CM

## 2017-03-28 NOTE — Telephone Encounter (Signed)
Pt would like to know if you can refill her Ambien through mail order?    (FYI for me - Also need to [facility] )

## 2017-03-28 NOTE — Telephone Encounter (Signed)
Jury noticed done by MD and left up front - pt aware to pick up.

## 2017-03-31 ENCOUNTER — Other Ambulatory Visit: Payer: Self-pay | Admitting: Family Medicine

## 2017-03-31 ENCOUNTER — Telehealth (HOSPITAL_COMMUNITY): Payer: Self-pay

## 2017-03-31 DIAGNOSIS — F330 Major depressive disorder, recurrent, mild: Secondary | ICD-10-CM

## 2017-03-31 MED ORDER — ATORVASTATIN CALCIUM 20 MG PO TABS: 20.0000 mg | ORAL_TABLET | Freq: Every day | ORAL | 3 refills | Status: DC

## 2017-03-31 MED ORDER — ZOLPIDEM TARTRATE 5 MG PO TABS: 5.0000 mg | ORAL_TABLET | Freq: Every evening | ORAL | 1 refills | Status: DC | PRN

## 2017-03-31 NOTE — Telephone Encounter (Signed)
Lipitor sent to [facility] and Ambien faxed to [facility] with confirmation

## 2017-03-31 NOTE — Telephone Encounter (Signed)
ok 

## 2017-03-31 NOTE — Telephone Encounter (Signed)
Patient called Friday afternoon and lvm for a refill on her Ambien. I checked her medications and saw that they were filled by Dr. [name] this morning. I called patient and lvm advising her that Dr. [name] would now have to take over prescribing the Ambien as she can not have 2 prescribers and they did not call our office before sending the prescription. I also advised her she needs to make a follow up appointment.

## 2017-06-02 ENCOUNTER — Ambulatory Visit (INDEPENDENT_AMBULATORY_CARE_PROVIDER_SITE_OTHER): Payer: Medicare HMO | Admitting: Family Medicine

## 2017-06-02 DIAGNOSIS — E538 Deficiency of other specified B group vitamins: Secondary | ICD-10-CM

## 2017-06-02 DIAGNOSIS — Z23 Encounter for immunization: Secondary | ICD-10-CM

## 2017-06-03 ENCOUNTER — Ambulatory Visit: Payer: Medicare HMO | Admitting: Family Medicine

## 2017-06-03 ENCOUNTER — Telehealth: Payer: Self-pay | Admitting: Family Medicine

## 2017-06-03 ENCOUNTER — Other Ambulatory Visit: Payer: Self-pay | Admitting: Family Medicine

## 2017-06-03 DIAGNOSIS — N644 Mastodynia: Secondary | ICD-10-CM

## 2017-06-03 DIAGNOSIS — F330 Major depressive disorder, recurrent, mild: Secondary | ICD-10-CM

## 2017-06-03 NOTE — Telephone Encounter (Signed)
agreed

## 2017-06-03 NOTE — Telephone Encounter (Signed)
Wants refill of Zolpidem to [facility] and only wants #30 at time.

## 2017-06-03 NOTE — Telephone Encounter (Signed)
Pt has been having vague pain in her breasts.  Denies felling any "lumps or bumps"  Tried to schedule a mammogram but htye told her need to have PCP order Diagnostic Mamm[facility].  Order placed and appt made for pt.

## 2017-06-04 NOTE — Telephone Encounter (Signed)
Ok to refill 

## 2017-06-05 NOTE — Telephone Encounter (Signed)
ok 

## 2017-06-10 ENCOUNTER — Other Ambulatory Visit: Payer: Self-pay

## 2017-06-13 ENCOUNTER — Ambulatory Visit: Admission: RE | Admit: 2017-06-13 | Payer: Self-pay | Source: Ambulatory Visit

## 2017-06-13 ENCOUNTER — Ambulatory Visit
Admission: RE | Admit: 2017-06-13 | Discharge: 2017-06-13 | Disposition: A | Discharge status: Home / Self Care | Payer: Medicare HMO | Source: Ambulatory Visit | Attending: Family Medicine | Admitting: Family Medicine

## 2017-06-13 ENCOUNTER — Ambulatory Visit: Payer: Self-pay

## 2017-06-13 DIAGNOSIS — N644 Mastodynia: Secondary | ICD-10-CM

## 2017-06-13 DIAGNOSIS — R928 Other abnormal and inconclusive findings on diagnostic imaging of breast: Secondary | ICD-10-CM | POA: Diagnosis not present

## 2017-07-04 ENCOUNTER — Ambulatory Visit (INDEPENDENT_AMBULATORY_CARE_PROVIDER_SITE_OTHER): Payer: Medicare HMO

## 2017-07-04 DIAGNOSIS — E538 Deficiency of other specified B group vitamins: Secondary | ICD-10-CM | POA: Diagnosis not present

## 2017-07-04 MED ORDER — CYANOCOBALAMIN 1000 MCG/ML IJ SOLN
1000.0000 ug | Freq: Once | INTRAMUSCULAR | Status: AC
  Administered 2017-07-04: 1000 ug via INTRAMUSCULAR

## 2017-07-04 NOTE — Progress Notes (Signed)
Patient was in office to receive her b12 injection. Patient received the injection in her left ventrogluteal. Patient tolerated well

## 2017-07-11 ENCOUNTER — Other Ambulatory Visit: Payer: Self-pay | Admitting: Family Medicine

## 2017-07-16 ENCOUNTER — Other Ambulatory Visit: Payer: Self-pay | Admitting: Family Medicine

## 2017-07-22 ENCOUNTER — Telehealth: Payer: Self-pay

## 2017-07-22 ENCOUNTER — Ambulatory Visit: Payer: Medicare HMO

## 2017-07-22 NOTE — Telephone Encounter (Signed)
Patient was in office for b12 injection which it was too soon for her to receive, so the injection was rescheduled for 08/04/2017. While in office patient complained of slight headache, chest congestion, and a cough. Patient states she has been taking Mucinex for about 2 weeks and it is not helping. Patient states the mucus is thick and white offered to give patient an appointment for 12/19 due to transportation patient was not able to take the appointment. Patient is asking for a rx if possible

## 2017-07-22 NOTE — Telephone Encounter (Signed)
Ntbs.

## 2017-07-23 NOTE — Telephone Encounter (Signed)
Spoke to pt and aware of provider recommendations and apt made to see Dr. [name] tomorrow for sick visit.

## 2017-07-23 NOTE — Telephone Encounter (Signed)
Patient was unavailable when I called [facility]

## 2017-07-24 ENCOUNTER — Ambulatory Visit: Payer: Medicare HMO | Admitting: Family Medicine

## 2017-07-24 ENCOUNTER — Encounter: Payer: Self-pay | Admitting: Family Medicine

## 2017-07-24 VITALS — BP 132/80 | HR 78 | Temp 98.4°F | Resp 16 | Ht 64.5 in | Wt 220.0 lb

## 2017-07-24 DIAGNOSIS — J209 Acute bronchitis, unspecified: Secondary | ICD-10-CM

## 2017-07-24 DIAGNOSIS — R011 Cardiac murmur, unspecified: Secondary | ICD-10-CM

## 2017-07-24 MED ORDER — AZITHROMYCIN 250 MG PO TABS: ORAL_TABLET | ORAL | 0 refills | Status: DC

## 2017-07-24 MED ORDER — PREDNISONE 20 MG PO TABS: ORAL_TABLET | ORAL | 0 refills | Status: DC

## 2017-07-24 NOTE — Progress Notes (Signed)
Subjective:    Patient ID: Kelly [name], female    DOB: 1950-09-12, 66 y.o.   MRN: [id number]  HPI Patient symptoms began more than 2 weeks ago for sinus infection. While the pain in her sinuses has resolved and the rhinorrhea has resolved, the patient has a worsening cough, increasing shortness of breath, and increasing frequencies of coughing spasms. The cough and spasms sometimes leave her breathless and unable to catch her breath. She denies any fevers or chills. She denies any hemoptysis. She denies any purulent sputum. She reports a cough productive of clear and yellow sputum. On examination today, she has markedly diminished breath sounds bilaterally with expiratory wheezes right greater than left. There are no crackles Rales. She has no previous history of asthma or bronchospasm but bronchospasm is prevalent on her exam today. Furthermore, she has a loud cardiac murmur that is 3-4 on a scale of 6. Past Medical History:  Diagnosis Date  . Adenomatous polyp   . Anxiety   . Arrhythmia   . Diabetes ([place])   . Diverticulosis   . Fatty liver   . Hyperlipidemia   . Hypertension   . NASH (nonalcoholic steatohepatitis)   . Obesity   . SOB (shortness of breath)   . Thrombocytopenia ([place])   . Thyroid disease   . Vitamin D deficiency    Past Surgical History:  Procedure Laterality Date  . oopherectomy    . OVARIAN CYST REMOVAL     Current Outpatient Medications on File Prior to Visit  Medication Sig Dispense Refill  . ACCU-CHEK AVIVA PLUS test strip     . ACCU-CHEK SOFTCLIX LANCETS lancets     . atorvastatin (LIPITOR) 20 MG tablet Take 1 tablet (20 mg total) by mouth daily. 90 tablet 3  . Blood Glucose Monitoring Suppl (ACCU-CHEK AVIVA PLUS) w/Device KIT     . glimepiride (AMARYL) 1 MG tablet TAKE 1 TABLET EVERY DAY WITH BREAKFAST 90 tablet 1  . losartan-hydrochlorothiazide (HYZAAR) 100-12.5 MG tablet Take 1 tablet by mouth daily. 90 tablet 3  . metFORMIN (GLUCOPHAGE-XR) 500 MG 24 hr  tablet TAKE 2 TABLETS TWICE DAILY 360 tablet 1  . metoprolol tartrate (LOPRESSOR) 25 MG tablet Take 0.5 tablets (12.5 mg total) by mouth 2 (two) times daily. 180 tablet 3  . traZODone (DESYREL) 50 MG tablet TAKE 1 TABLET AT BEDTIME AS NEEDED FOR SLEEP 90 tablet 1  . vortioxetine HBr (TRINTELLIX) 20 MG TABS Take 20 mg by mouth daily. 60 mg 0  . zolpidem (AMBIEN) 5 MG tablet TAKE 1 TABLET BY MOUTH EVERY DAY AT BEDTIME AS NEEDED FOR SLEEP 30 tablet 2  . levothyroxine (SYNTHROID, LEVOTHROID) 75 MCG tablet Take 1 tablet (75 mcg total) by mouth daily. (Patient not taking: Reported on 07/24/2017) 90 tablet 3   Current Facility-Administered Medications on File Prior to Visit  Medication Dose Route Frequency Provider Last Rate Last Dose  . cyanocobalamin ((VITAMIN B-12)) injection 1,000 mcg  1,000 mcg Subcutaneous Q30 days [name], MD   1,000 mcg at 06/02/17 1214   Allergies  Allergen Reactions  . Celecoxib   . Prednisone Itching    Heart rate increased  . Macrodantin [Nitrofurantoin Macrocrystal] Rash  . Penicillins Hives    Per family   Social History   Socioeconomic History  . Marital status: Married    Spouse name: Not on file  . Number of children: Not on file  . Years of education: Not on file  . Highest education level: Not  on file  Social Needs  . Financial resource strain: Not on file  . Food insecurity - worry: Not on file  . Food insecurity - inability: Not on file  . Transportation needs - medical: Not on file  . Transportation needs - non-medical: Not on file  Occupational History  . Not on file  Tobacco Use  . Smoking status: Current Every Day Smoker    Packs/day: 0.50    Types: Cigarettes  . Smokeless tobacco: Never Used  Substance and Sexual Activity  . Alcohol use: No  . Drug use: No  . Sexual activity: Not Currently  Other Topics Concern  . Not on file  Social History Narrative  . Not on file      Review of Systems  All other systems reviewed  and are negative.      Objective:   Physical Exam  Constitutional: She appears well-developed and well-nourished. No distress.  HENT:  Right Ear: External ear normal.  Left Ear: External ear normal.  Nose: Nose normal.  Mouth/Throat: Oropharynx is clear and moist.  Eyes: Conjunctivae are normal.  Cardiovascular: Normal rate and regular rhythm.  Murmur heard. Pulmonary/Chest: Effort normal. No respiratory distress. She has wheezes. She has no rales. She exhibits no tenderness.  Abdominal: Soft. Bowel sounds are normal.  Skin: She is not diaphoretic.  Vitals reviewed.         Assessment & Plan:  Bronchitis, acute, with bronchospasm - Plan: azithromycin (ZITHROMAX) 250 MG tablet, predniSONE (DELTASONE) 20 MG tablet  Murmur, heart - Plan: ECHOCARDIOGRAM COMPLETE  Patient appears to have bronchitis coupled with bronchospasm. I will start the patient on a prednisone taper pack for the bronchospasm as well as a Z-Pak for bronchitis and then recheck her in one week or sooner if worse. I am concerned by the severity of her murmur. She states that she's had this for 30 years however I would like to obtain an echocardiogram to characterize the exact cause of the murmur and to determine the severity of it.

## 2017-07-30 ENCOUNTER — Other Ambulatory Visit: Payer: Self-pay

## 2017-07-30 ENCOUNTER — Ambulatory Visit (HOSPITAL_COMMUNITY): Payer: Medicare HMO | Attending: Family Medicine

## 2017-07-30 DIAGNOSIS — R0602 Shortness of breath: Secondary | ICD-10-CM | POA: Diagnosis not present

## 2017-07-30 DIAGNOSIS — I081 Rheumatic disorders of both mitral and tricuspid valves: Secondary | ICD-10-CM | POA: Diagnosis not present

## 2017-07-30 DIAGNOSIS — E119 Type 2 diabetes mellitus without complications: Secondary | ICD-10-CM | POA: Diagnosis not present

## 2017-07-30 DIAGNOSIS — Z720 Tobacco use: Secondary | ICD-10-CM | POA: Insufficient documentation

## 2017-07-30 DIAGNOSIS — R011 Cardiac murmur, unspecified: Secondary | ICD-10-CM | POA: Insufficient documentation

## 2017-07-30 DIAGNOSIS — I77810 Thoracic aortic ectasia: Secondary | ICD-10-CM | POA: Diagnosis not present

## 2017-07-30 DIAGNOSIS — I272 Pulmonary hypertension, unspecified: Secondary | ICD-10-CM | POA: Diagnosis not present

## 2017-07-30 DIAGNOSIS — E079 Disorder of thyroid, unspecified: Secondary | ICD-10-CM | POA: Insufficient documentation

## 2017-07-30 DIAGNOSIS — I10 Essential (primary) hypertension: Secondary | ICD-10-CM | POA: Diagnosis not present

## 2017-07-30 DIAGNOSIS — F419 Anxiety disorder, unspecified: Secondary | ICD-10-CM | POA: Insufficient documentation

## 2017-07-30 DIAGNOSIS — E785 Hyperlipidemia, unspecified: Secondary | ICD-10-CM | POA: Diagnosis not present

## 2017-07-30 DIAGNOSIS — K760 Fatty (change of) liver, not elsewhere classified: Secondary | ICD-10-CM | POA: Insufficient documentation

## 2017-07-31 ENCOUNTER — Encounter: Payer: Self-pay | Admitting: Family Medicine

## 2017-07-31 ENCOUNTER — Other Ambulatory Visit: Payer: Self-pay | Admitting: Family Medicine

## 2017-07-31 DIAGNOSIS — I350 Nonrheumatic aortic (valve) stenosis: Secondary | ICD-10-CM | POA: Insufficient documentation

## 2017-08-04 ENCOUNTER — Ambulatory Visit: Payer: Medicare HMO

## 2017-09-06 ENCOUNTER — Other Ambulatory Visit: Payer: Self-pay | Admitting: Family Medicine

## 2017-09-06 DIAGNOSIS — F330 Major depressive disorder, recurrent, mild: Secondary | ICD-10-CM

## 2017-09-08 ENCOUNTER — Ambulatory Visit (INDEPENDENT_AMBULATORY_CARE_PROVIDER_SITE_OTHER): Payer: Medicare HMO | Admitting: Family Medicine

## 2017-09-08 ENCOUNTER — Encounter: Payer: Self-pay | Admitting: Family Medicine

## 2017-09-08 VITALS — BP 146/80 | HR 62 | Temp 97.8°F | Resp 18 | Ht 64.5 in | Wt 211.0 lb

## 2017-09-08 DIAGNOSIS — E119 Type 2 diabetes mellitus without complications: Secondary | ICD-10-CM

## 2017-09-08 DIAGNOSIS — K7460 Unspecified cirrhosis of liver: Secondary | ICD-10-CM

## 2017-09-08 DIAGNOSIS — E039 Hypothyroidism, unspecified: Secondary | ICD-10-CM | POA: Diagnosis not present

## 2017-09-08 DIAGNOSIS — I10 Essential (primary) hypertension: Secondary | ICD-10-CM

## 2017-09-08 DIAGNOSIS — E538 Deficiency of other specified B group vitamins: Secondary | ICD-10-CM

## 2017-09-08 DIAGNOSIS — K7581 Nonalcoholic steatohepatitis (NASH): Secondary | ICD-10-CM

## 2017-09-08 MED ORDER — LISINOPRIL-HYDROCHLOROTHIAZIDE 20-12.5 MG PO TABS: 2.0000 | ORAL_TABLET | Freq: Every day | ORAL | 3 refills | Status: DC

## 2017-09-08 NOTE — Telephone Encounter (Signed)
Requesting refill    ambien  LOV: 07/24/17  LRF:  06/05/17

## 2017-09-08 NOTE — Progress Notes (Signed)
Subjective:    Patient ID: Kelly [name], female    DOB: 1951/06/22, 67 y.o.   MRN: [id number]  Insomnia     10/2016 Patient is a new patient here today with her son to establish care. Apparently she was recently admitted to the hospital with altered mental status. She was diagnosed with benzodiazepine intoxication and underlying dementia. She is here today for follow-up. She also apparently had low B12 levels which was confirmed in the hospital and she's been taking B12 tablets. Patient states that she is always been extremely anxious to stay at home alone. Recently her husband has been in a rehabilitation facility after surgery. Therefore she was taking Xanax prescribed by her previous primary care provider in addition to Ambien which she will take at night to help her sleep. Apparently she has been on Ambien for many many years due to chronic insomnia. Her son was unable to get her to answer the door one morning and broke in the back door to find his mother extremely intoxicated and confused. She was taken to the hospital. Their CAT scan showed mild diffuse cortical thickening. Ammonia levels were normal. RPR was normal. TSH was normal. Hemoglobin A1c was 5.4 PA B12 level was low at less than 200. Otherwise workup was unremarkable aside from some thrombocytopenia which is chronic and a mild anemia presumed to be secondary to B12 deficiency and iron deficiency. Since discharge from the hospital she is not taking any further Xanax. Her son has also not allowed her to have Ambien over the last month. She is here today for evaluation. I performed a Mini-Mental status exam. The patient is easily able to score is 5 out of 5. She also easily tells me the location. She is able to remember 3 objects easily. She can spell world in reverse and perform serial sevens. However the patient does have a noticeable word finding difficulty just in general communication. She has a difficult time remembering the names of  objects and remembering what she's trying to tell me. On clock drawing assessment, the patient is able to correctly draw the clock and the time. Takes for a prolonged period of time to do this. I then performed a [place] cognitive assessment and the patient had an extremely difficult time performing the pattern test alternating between letters and numbers in order. Even after I explained to the patient she has a difficult time drawing the areas appropriately. Therefore it appears that she has some mild dementia with some mild anomia.  Patient is requesting a refill on her Ambien.  She also has a history of lymphocytic colitis previously managed by Dr. [name]. She has not seen him in quite some time and she is not taking that the desonide that he had previously prescribed. As a result she has frequent diarrhea which could be contributing to her B12 deficiency the fact of malabsorptive diarrhea. She also has underlying history of nonalcoholic steatohepatitis complicated by thrombocytopenia. Platelet counts in the hospital between 70 and 80. She has a mild anemia with hemoglobin between 11 and 12. Her diabetes thankfully was well controlled at 5.4 and her TSH was within normal limits.  At that time, my plan was: The patient's memory loss was likely multifactorial. Given the cortical thickening on CT scan and some of the findings demonstrated today on her cognitive assessment she appears to have a very mild dementia. It is difficult for me to gauge this given the fact I am just meeting the patient today. I  believe this is likely exacerbated by the combination of benzodiazepines which she [place] have taken several and her Ambien. This could also be affected by her B12 deficiency. Therefore I recommended that we treat the patient's B12 deficiency with B12 injections parenterally 1000 g every month as I'm concerned that she does not absorb B12 well to the gastrointestinal tract due to her lymphocytic colitis. I've also  recommended that she start iron replacement. I spent more than 45 minutes with this patient discussing her diagnosis and evaluating her. I strongly recommended that we keep her away from any medication that can exacerbate her memory particularly benzodiazepines and Ambien. I believe a lot of her problems are exacerbated by anxiety. Therefore I recommended neuropsychological consultation to determine medication options. Perhaps trazodone would be a better option for this patient rather than Ambien. I would like to see her back in one month to discuss and follow-up. I refilled her other medications. No lab work was obtained at this visit but I reviewed the battery of lab work that was obtained at her most recent hospitalization:  11/07/16 Patient is under tremendous stress. Her husband is battling congestive heart failure and recently underwent an amputation. She states that he is constantly unloading his burdens and complaints onto her. However he is in denial about her medical problems. She feels very little support. She continues to battle anxiety and insomnia. She reports poor concentration, depression, anhedonia. She has very little energy. In the past she is tried and failed Zoloft, Wellbutrin, Prozac," several others". She denies any suicidal ideation. She denies any hallucinations or delusions. She is wanting to try Xanax again to help her sleep. Please see the previous office visit we discussed the problems of Xanax created for this patient.  AT that time, my plan was: I spent more than 25 minutes with this patient discussing her problems. I do believe she has mild dementia but also believe she could be suffering from depression. I would like to try the patient on Trintellix 5 mg a day for 1 week and gradually increased to 10 mg a day. Recheck in one month to see if there is benefit. Try to avoid high-risk medications such as benzodiazepines. She can continue use the trazodone as needed for  sleep.  03/14/17 Patient is here today for follow-up. I had asked the patient to return to recheck a fasting lipid panel, recheck her TSH, recheck her hemoglobin A1c to monitor the management of her diabetes, hypothyroidism, and to monitor for evidence of hyperlipidemia. However the patient upon arrival tells me that her depression is worse. Spent more than 30 minutes with the patient in discussion. He does not include her in any financial decisions. He does not seem to respect her take into consideration her desires or her plans.  He also apparently had an affair for many years and has never asked for forgiveness. They have never discussed this normal had any kind of resolution regarding this. I believe that her stressors in her marriage are contributing to her depression. From the standpoint of her dementia, she has very mild memory loss. Just talking to the patient, is difficult to notice. However she stopped coming for her B12 injections and I believe is due to the fact that she forgot them. She states that she didn't and that she was confused on how often she needed to come. I recommended that she come once a month moving forward for B12 injections. Her blood pressure today is adequately controlled.  She denies  any hypoglycemia. She denies any polyuria, polydipsia, or blurred vision. She denies any tachycardia or palpitations. She denies any substantial changes in her weight.  At that time, my plan was: Blood pressure today is well controlled. I'll make no changes in her blood pressure medication. She has a history of nonalcoholic steatohepatitis. Therefore I've asked the patient to return fasting for fasting lipid panel as well as a CMP to monitor for dyslipidemia and monitor her liver function test. At that time I'll also check a TSH to monitor management of her hypothyroidism. I would also like to check a hemoglobin A1c and a urine microalbumin. If her A1c stays less than 6.5, I would like to discontinue  Amaryl due to the risk of hypoglycemia area and spent more than 30 minutes with the patient discussing her situation. Recommended marriage counseling. I believe that until they seek therapy and come to some type of understanding and forgiveness that she will harm her resentment that would contribute to her depression moving forward. I believe that she feels disrespected and a lack of love in the marriage and this is contributing to her depression.   09/08/17 Patient discontinued trintellix in November.  However she denies any depression today.  Her blood pressure slightly elevated 146/80.  She is on losartan/hydrochlorothiazide 100/12.5.  She denies any chest pain.  She does report occasional shortness of breath.  At her last visit, I obtain an echocardiogram because of her worsening murmur which confirms moderate to severe aortic stenosis.  She is scheduled to see a cardiologist later this month to discuss treatment options.  Therefore I do not want to lower preload significantly despite the elevated blood pressures because of aortic stenosis.  Patient denies syncope or near syncope or palpitations.  She is not checking her blood sugars regularly.  However she denies any hypoglycemic episodes.  She denies any polyuria, polydipsia, or blurry vision.  She denies any myalgias or right upper quadrant pain.  Recently her insurance has denied her Ambien.  She takes Ambien at night to help her sleep.  She has tried trazodone in the past without benefit.  She is also tried Belsomra in the past and this did not work as well.  Therefore she is requesting that I try to complete a prior authorization for the medication.  She is also here today to recheck her TSH.  However upon medication list review, the patient is not sure that she is actually taking levothyroxine.  Past Medical History:  Diagnosis Date  . Adenomatous polyp   . Anxiety   . Aortic stenosis   . Arrhythmia   . Diabetes ([place])   . Diverticulosis   .  Fatty liver   . Hyperlipidemia   . Hypertension   . NASH (nonalcoholic steatohepatitis)   . Obesity   . SOB (shortness of breath)   . Thrombocytopenia ([place])   . Thyroid disease   . Vitamin D deficiency    Past Surgical History:  Procedure Laterality Date  . oopherectomy    . OVARIAN CYST REMOVAL     Current Outpatient Medications on File Prior to Visit  Medication Sig Dispense Refill  . ACCU-CHEK AVIVA PLUS test strip     . ACCU-CHEK SOFTCLIX LANCETS lancets     . atorvastatin (LIPITOR) 20 MG tablet Take 1 tablet (20 mg total) by mouth daily. 90 tablet 3  . Blood Glucose Monitoring Suppl (ACCU-CHEK AVIVA PLUS) w/Device KIT     . glimepiride (AMARYL) 1 MG tablet TAKE 1  TABLET EVERY DAY WITH BREAKFAST 90 tablet 1  . losartan-hydrochlorothiazide (HYZAAR) 100-12.5 MG tablet Take 1 tablet by mouth daily. 90 tablet 3  . metFORMIN (GLUCOPHAGE-XR) 500 MG 24 hr tablet TAKE 2 TABLETS TWICE DAILY 360 tablet 1  . metoprolol tartrate (LOPRESSOR) 25 MG tablet Take 0.5 tablets (12.5 mg total) by mouth 2 (two) times daily. 180 tablet 3  . traZODone (DESYREL) 50 MG tablet TAKE 1 TABLET AT BEDTIME AS NEEDED FOR SLEEP 90 tablet 1  . zolpidem (AMBIEN) 5 MG tablet TAKE 1 TABLET BY MOUTH EVERY DAY AT BEDTIME AS NEEDED FOR SLEEP 30 tablet 2  . levothyroxine (SYNTHROID, LEVOTHROID) 75 MCG tablet Take 1 tablet (75 mcg total) by mouth daily. (Patient not taking: Reported on 09/08/2017) 90 tablet 3  . vortioxetine HBr (TRINTELLIX) 20 MG TABS Take 20 mg by mouth daily. (Patient not taking: Reported on 09/08/2017) 60 mg 0   Current Facility-Administered Medications on File Prior to Visit  Medication Dose Route Frequency Provider Last Rate Last Dose  . cyanocobalamin ((VITAMIN B-12)) injection 1,000 mcg  1,000 mcg Subcutaneous Q30 days [name], MD   1,000 mcg at 06/02/17 1214   Allergies  Allergen Reactions  . Celecoxib   . Prednisone Itching    Heart rate increased  . Macrodantin [Nitrofurantoin  Macrocrystal] Rash  . Penicillins Hives    Per family   Social History   Socioeconomic History  . Marital status: Married    Spouse name: Not on file  . Number of children: Not on file  . Years of education: Not on file  . Highest education level: Not on file  Social Needs  . Financial resource strain: Not on file  . Food insecurity - worry: Not on file  . Food insecurity - inability: Not on file  . Transportation needs - medical: Not on file  . Transportation needs - non-medical: Not on file  Occupational History  . Not on file  Tobacco Use  . Smoking status: Current Every Day Smoker    Packs/day: 0.50    Types: Cigarettes  . Smokeless tobacco: Never Used  Substance and Sexual Activity  . Alcohol use: No  . Drug use: No  . Sexual activity: Not Currently  Other Topics Concern  . Not on file  Social History Narrative  . Not on file   Family History  Problem Relation Age of Onset  . Lung cancer Father   . Heart disease Father   . Alcohol abuse Father   . Alzheimer's disease Mother   . Dementia Mother   . Other Mother        twisted colon   . Stomach cancer Neg Hx   . Colon cancer Neg Hx       Review of Systems  Psychiatric/Behavioral: The patient has insomnia.   All other systems reviewed and are negative.      Objective:   Physical Exam  Constitutional: She is oriented to person, place, and time. She appears well-developed and well-nourished.  HENT:  Right Ear: External ear normal.  Left Ear: External ear normal.  Nose: Nose normal.  Mouth/Throat: Oropharynx is clear and moist. No oropharyngeal exudate.  Eyes: Conjunctivae and EOM are normal. Pupils are equal, round, and reactive to light.  Neck: Neck supple. No JVD present. No thyromegaly present.  Cardiovascular: Normal rate and regular rhythm.  Murmur heard.  Systolic murmur is present with a grade of 3/6. Pulmonary/Chest: Effort normal and breath sounds normal. No respiratory distress.  She has no  wheezes. She has no rales.  Abdominal: Soft. Bowel sounds are normal. She exhibits no distension. There is no tenderness. There is no rebound and no guarding.  Musculoskeletal: She exhibits no edema.  Lymphadenopathy:    She has no cervical adenopathy.  Neurological: She is alert and oriented to person, place, and time. She has normal reflexes. No cranial nerve deficit. She exhibits normal muscle tone. Coordination normal.  Psychiatric: She has a normal mood and affect. Her behavior is normal. Judgment and thought content normal.  Vitals reviewed.         Assessment & Plan:  Type 2 diabetes mellitus without complication, without long-term current use of insulin (HCC) - Plan: Microalbumin, urine, Hemoglobin A1c  Liver cirrhosis secondary to NASH (HCC)  Essential hypertension - Plan: CBC with Differential/Platelet, COMPLETE METABOLIC PANEL WITH GFR, Lipid panel  Hypothyroidism, unspecified type - Plan: TSH Patient has discontinued her antidepressant.  However she is doing well and I see no reason to resume medication that the patient does not seem to require.  I would like to check a hemoglobin A1c to monitor the management of her diabetes mellitus.  If her hemoglobin A1c is significantly below 6, I would discontinue glimepiride due to the risk of hypoglycemia.  I will also monitor her liver function test regarding her history of cirrhosis secondary to [facility].  Her blood pressure slightly elevated today however I do not want to lower preload significantly because of her aortic stenosis.  I will switch her losartan HCTZ to lisinopril HCTZ 20/12.5, 2 tablets once a day due to the recent recall of losartan and difficulty finding that medication.  I will check a TSH to ensure that the patient is compliant with her levothyroxine.  Patient will go home and verify whether or not she is actually been taking the medication.  I will also check a fasting lipid panel.  Goal LDL cholesterol is less than 100  because of the diabetes.  Regarding her aortic stenosis, she is scheduled to see the cardiologist later this month.

## 2017-09-09 LAB — MICROALBUMIN, URINE: Microalb, Ur: 1.7 mg/dL

## 2017-09-09 LAB — CBC WITH DIFFERENTIAL/PLATELET
BASOS ABS: 49 {cells}/uL (ref 0–200)
Basophils Relative: 0.7 %
EOS ABS: 217 {cells}/uL (ref 15–500)
Eosinophils Relative: 3.1 %
HCT: 35.5 % (ref 35.0–45.0)
Hemoglobin: 12 g/dL (ref 11.7–15.5)
LYMPHS ABS: 2163 {cells}/uL (ref 850–3900)
MCH: 26.4 pg — AB (ref 27.0–33.0)
MCHC: 33.8 g/dL (ref 32.0–36.0)
MCV: 78.2 fL — AB (ref 80.0–100.0)
MPV: 11.6 fL (ref 7.5–12.5)
Monocytes Relative: 9.3 %
NEUTROS PCT: 56 %
Neutro Abs: 3920 cells/uL (ref 1500–7800)
Platelets: 87 10*3/uL — ABNORMAL LOW (ref 140–400)
RBC: 4.54 10*6/uL (ref 3.80–5.10)
RDW: 14.9 % (ref 11.0–15.0)
Total Lymphocyte: 30.9 %
WBC mixed population: 651 cells/uL (ref 200–950)
WBC: 7 10*3/uL (ref 3.8–10.8)

## 2017-09-09 LAB — COMPLETE METABOLIC PANEL WITH GFR
AG RATIO: 1.3 (calc) (ref 1.0–2.5)
ALBUMIN MSPROF: 3.7 g/dL (ref 3.6–5.1)
ALKALINE PHOSPHATASE (APISO): 103 U/L (ref 33–130)
ALT: 12 U/L (ref 6–29)
AST: 18 U/L (ref 10–35)
BILIRUBIN TOTAL: 0.9 mg/dL (ref 0.2–1.2)
BUN: 11 mg/dL (ref 7–25)
CHLORIDE: 107 mmol/L (ref 98–110)
CO2: 24 mmol/L (ref 20–32)
Calcium: 9.7 mg/dL (ref 8.6–10.4)
Creat: 0.74 mg/dL (ref 0.50–0.99)
GFR, Est African American: 96 mL/min/{1.73_m2} (ref >=60)
GFR, Est Non African American: 83 mL/min/{1.73_m2} (ref >=60)
GLOBULIN: 2.9 g/dL (ref 1.9–3.7)
Glucose, Bld: 135 mg/dL — ABNORMAL HIGH (ref 65–99)
POTASSIUM: 3.9 mmol/L (ref 3.5–5.3)
SODIUM: 141 mmol/L (ref 135–146)
TOTAL PROTEIN: 6.6 g/dL (ref 6.1–8.1)

## 2017-09-09 LAB — LIPID PANEL
Cholesterol: 172 mg/dL (ref <200)
HDL: 54 mg/dL (ref >50)
LDL CHOLESTEROL (CALC): 97 mg/dL
Non-HDL Cholesterol (Calc): 118 mg/dL (calc) (ref <130)
Total CHOL/HDL Ratio: 3.2 (calc) (ref <5.0)
Triglycerides: 111 mg/dL (ref <150)

## 2017-09-09 LAB — HEMOGLOBIN A1C
EAG (MMOL/L): 7.6 (calc)
Hgb A1c MFr Bld: 6.4 % of total Hgb — ABNORMAL HIGH (ref <5.7)
MEAN PLASMA GLUCOSE: 137 (calc)

## 2017-09-09 LAB — TSH: TSH: 1.43 m[IU]/L (ref 0.40–4.50)

## 2017-09-10 ENCOUNTER — Ambulatory Visit (INDEPENDENT_AMBULATORY_CARE_PROVIDER_SITE_OTHER): Payer: Medicare HMO | Admitting: Cardiovascular Disease

## 2017-09-10 ENCOUNTER — Encounter: Payer: Self-pay | Admitting: Cardiovascular Disease

## 2017-09-10 VITALS — BP 155/93 | HR 55 | Ht 64.5 in | Wt 214.1 lb

## 2017-09-10 DIAGNOSIS — I350 Nonrheumatic aortic (valve) stenosis: Secondary | ICD-10-CM

## 2017-09-10 NOTE — Patient Instructions (Signed)
Medication Instructions:  Your physician recommends that you continue on your current medications as directed. Please refer to the Current Medication list given to you today.   Labwork: none  Testing/Procedures: Your physician has requested that you have an echocardiogram. Echocardiography is a painless test that uses sound waves to create images of your heart. It provides your doctor with information about the size and shape of your heart and how well your heart's chambers and valves are working. This procedure takes approximately one hour. There are no restrictions for this procedure.  To be done in mid to late June  Follow-Up: Your physician recommends that you schedule a follow-up appointment in: after echo in June   Any Other Special Instructions Will Be Listed Below (If Applicable).     If you need a refill on your cardiac medications before your next appointment, please call your pharmacy.

## 2017-09-10 NOTE — Progress Notes (Signed)
Chief Complaint  Patient presents with  . New Patient (Initial Visit)    History of Present Illness: 67 yo female with history of DM, HTN, HLD, memory issues,  tobacco abuse and aortic stenosis who is referred today as a new consult by Dr. [name] for further discussion regarding her aortic valve stenosis. Echo December 2018 with normal LV systolic function. The aortic valve is thickened with restricted leaflet excursion, mean gradient 36 mmHg, peak gradient 59 mmHg, AVA 1.22 cm2, DVI 0.39. She has had mild dyspnea with exertion with fatigue and mild dizziness at times. No exertional chest pain. No near syncope, syncope or LE edema.   Primary Care Physician: [name], MD   Past Medical History:  Diagnosis Date  . Adenomatous polyp   . Anxiety   . Aortic stenosis   . Arrhythmia   . Diabetes ([place])   . Diverticulosis   . Fatty liver   . Hyperlipidemia   . Hypertension   . NASH (nonalcoholic steatohepatitis)   . Obesity   . SOB (shortness of breath)   . Thrombocytopenia ([place])   . Thyroid disease   . Vitamin D deficiency     Past Surgical History:  Procedure Laterality Date  . oopherectomy    . OVARIAN CYST REMOVAL      Current Outpatient Medications  Medication Sig Dispense Refill  . ACCU-CHEK AVIVA PLUS test strip     . ACCU-CHEK SOFTCLIX LANCETS lancets     . atorvastatin (LIPITOR) 20 MG tablet Take 1 tablet (20 mg total) by mouth daily. 90 tablet 3  . Blood Glucose Monitoring Suppl (ACCU-CHEK AVIVA PLUS) w/Device KIT     . glimepiride (AMARYL) 1 MG tablet TAKE 1 TABLET EVERY DAY WITH BREAKFAST 90 tablet 1  . levothyroxine (SYNTHROID, LEVOTHROID) 75 MCG tablet Take 1 tablet (75 mcg total) by mouth daily. 90 tablet 3  . metFORMIN (GLUCOPHAGE-XR) 500 MG 24 hr tablet TAKE 2 TABLETS TWICE DAILY 360 tablet 1  . metoprolol tartrate (LOPRESSOR) 25 MG tablet Take 0.5 tablets (12.5 mg total) by mouth 2 (two) times daily. 180 tablet 3  . traZODone (DESYREL) 50 MG tablet  TAKE 1 TABLET AT BEDTIME AS NEEDED FOR SLEEP 90 tablet 1  . zolpidem (AMBIEN) 5 MG tablet TAKE 1 TABLET BY MOUTH AT BEDTIME AS NEEDED FOR SLEEP 30 tablet 2  . lisinopril-hydrochlorothiazide (ZESTORETIC) 20-12.5 MG tablet Take 2 tablets by mouth daily. Stop losartan hctz (Patient not taking: Reported on 09/10/2017) 180 tablet 3   Current Facility-Administered Medications  Medication Dose Route Frequency Provider Last Rate Last Dose  . cyanocobalamin ((VITAMIN B-12)) injection 1,000 mcg  1,000 mcg Subcutaneous Q30 days [name], MD   1,000 mcg at 09/08/17 1408    Allergies  Allergen Reactions  . Celecoxib   . Prednisone Itching    Heart rate increased  . Macrodantin [Nitrofurantoin Macrocrystal] Rash  . Penicillins Hives    Per family    Social History   Socioeconomic History  . Marital status: Married    Spouse name: Not on file  . Number of children: Not on file  . Years of education: Not on file  . Highest education level: Not on file  Social Needs  . Financial resource strain: Not on file  . Food insecurity - worry: Not on file  . Food insecurity - inability: Not on file  . Transportation needs - medical: Not on file  . Transportation needs - non-medical: Not on file  Occupational  History  . Not on file  Tobacco Use  . Smoking status: Current Every Day Smoker    Packs/day: 0.50    Types: Cigarettes  . Smokeless tobacco: Never Used  Substance and Sexual Activity  . Alcohol use: No  . Drug use: No  . Sexual activity: Not Currently  Other Topics Concern  . Not on file  Social History Narrative  . Not on file    Family History  Problem Relation Age of Onset  . Lung cancer Father   . Heart disease Father        MI at age 45  . Alcohol abuse Father   . Alzheimer's disease Mother   . Dementia Mother   . Other Mother        twisted colon   . Stomach cancer Neg Hx   . Colon cancer Neg Hx     Review of Systems:  As stated in the HPI and otherwise  negative.   BP (!) 155/93   Pulse (!) 55   Ht 5' 4.5" (1.638 m)   Wt 214 lb 1.9 oz (97.1 kg)   SpO2 96%   BMI 36.19 kg/m   Physical Examination: General: Well developed, well nourished, NAD  HEENT: OP clear, mucus membranes moist  SKIN: warm, dry. No rashes. Neuro: No focal deficits  Musculoskeletal: Muscle strength 5/5 all ext  Psychiatric: Mood and affect normal  Neck: No JVD, no carotid bruits, no thyromegaly, no lymphadenopathy.  Lungs:Clear bilaterally, no wheezes, rhonci, crackles Cardiovascular: Regular rate and rhythm. Loud, harsh systolic murmur noted.  Abdomen:Soft. Bowel sounds present. Non-tender.  Extremities: No lower extremity edema. Pulses are 2 + in the bilateral DP/PT.  Echo 07/30/17: Left ventricle: The cavity size was normal. There was moderate   concentric hypertrophy. Systolic function was normal. The   estimated ejection fraction was in the range of 60% to 65%. Wall   motion was normal; there were no regional wall motion   abnormalities. Features are consistent with a pseudonormal left   ventricular filling pattern, with concomitant abnormal relaxation   and increased filling pressure (grade 2 diastolic dysfunction).   Doppler parameters are consistent with high ventricular filling   pressure. - Aortic valve: Valve mobility was restricted. There was moderate   to severe stenosis. Mean gradient (S): 36 mm Hg. Valve area   (VTI): 1.22 cm^2. - Aorta: Ascending aortic diameter: 38 mm (S). - Ascending aorta: The ascending aorta was mildly dilated. - Mitral valve: Calcified annulus. Mildly thickened leaflets .   There was mild regurgitation. - Left atrium: The atrium was moderately dilated. - Pulmonary arteries: PA peak pressure: 48 mm Hg (S).  Impressions:  - The right ventricular systolic pressure was increased consistent   with moderate pulmonary hypertension.  EKG:  EKG is ordered today. The ekg ordered today demonstrates Sinus bradycardia, T  wave inversions in all leads, unchanged from 2018  Recent Labs: 09/15/2016: Magnesium 2.0 09/08/2017: ALT 12; BUN 11; Creat 0.74; Hemoglobin 12.0; Platelets 87; Potassium 3.9; Sodium 141; TSH 1.43   Lipid Panel    Component Value Date/Time   CHOL 172 09/08/2017 1204   TRIG 111 09/08/2017 1204   HDL 54 09/08/2017 1204   CHOLHDL 3.2 09/08/2017 1204   VLDL 20 03/26/2017 0908   LDLCALC 118 (H) 03/26/2017 0908     Wt Readings from Last 3 Encounters:  09/10/17 214 lb 1.9 oz (97.1 kg)  09/08/17 211 lb (95.7 kg)  07/24/17 220 lb (99.8 kg)  Other studies Reviewed: Additional studies/ records that were reviewed today include: . Review of the above records demonstrates:    Assessment and Plan:   1. Aortic stenosis: She has moderately severe aortic valve stenosis. I have personally reviewed the echo images. The aortic valve is thickened and there is restricted leaflet motion. Based on her echo data, her AS is not severe. She has minimal symptoms at this time. I have reviewed the etiology of aortic stenosis today and treatment options when needed. I will plan to repeat her echo in June 2019 and will see her after that. Should she have worsening of symptoms in the meantime, she will call us.   2. Tobacco abuse: Smoking cessation advised.   Current medicines are reviewed at length with the patient today.  The patient does not have concerns regarding medicines.  The following changes have been made:  no change  Labs/ tests ordered today include:   Orders Placed This Encounter  Procedures  . EKG 12-Lead  . ECHOCARDIOGRAM COMPLETE     Disposition:   FU with me in 6 months   Signed, [name], MD 09/10/2017 9:36 AM    [facility] [place], [place], [place]  [address] Phone: [phone]; Fax: [phone]

## 2017-09-16 ENCOUNTER — Other Ambulatory Visit: Payer: Self-pay | Admitting: Family Medicine

## 2017-09-16 MED ORDER — LEVOTHYROXINE SODIUM 75 MCG PO TABS: 75.0000 ug | ORAL_TABLET | Freq: Every day | ORAL | 3 refills | Status: DC

## 2017-10-09 ENCOUNTER — Ambulatory Visit: Payer: Medicare HMO | Admitting: Family Medicine

## 2017-11-12 ENCOUNTER — Other Ambulatory Visit: Payer: Self-pay | Admitting: Family Medicine

## 2017-11-26 ENCOUNTER — Other Ambulatory Visit: Payer: Self-pay

## 2017-11-26 ENCOUNTER — Telehealth: Payer: Self-pay

## 2017-11-26 DIAGNOSIS — K7581 Nonalcoholic steatohepatitis (NASH): Principal | ICD-10-CM

## 2017-11-26 DIAGNOSIS — K7460 Unspecified cirrhosis of liver: Secondary | ICD-10-CM

## 2017-11-26 NOTE — Telephone Encounter (Signed)
Patient has come to the lab. She is telling them she was called by someone in our office and told to get labs. No orders for labs in Epic. No telephone encounter. Patient last OV was 12/2016. She was told to follow up with Dr [name] in 5 to 6 months. Asked Renee to convey this message to the patient.  Dr [name] is out of the office. Spoke with Ellouise Newer, PA. She gives orders for the labs. Called to the lab. Patient has left stating she has an appointment with her PCP today and will ask for labs. Lab order cancelled. Patient needs follow up.

## 2017-11-28 ENCOUNTER — Encounter: Payer: Self-pay | Admitting: Family Medicine

## 2017-11-28 ENCOUNTER — Ambulatory Visit (INDEPENDENT_AMBULATORY_CARE_PROVIDER_SITE_OTHER): Payer: Medicare HMO | Admitting: Family Medicine

## 2017-11-28 VITALS — BP 118/64 | HR 62 | Temp 98.8°F | Resp 18 | Ht 64.5 in | Wt 213.0 lb

## 2017-11-28 DIAGNOSIS — F339 Major depressive disorder, recurrent, unspecified: Secondary | ICD-10-CM | POA: Diagnosis not present

## 2017-11-28 DIAGNOSIS — E039 Hypothyroidism, unspecified: Secondary | ICD-10-CM | POA: Diagnosis not present

## 2017-11-28 DIAGNOSIS — I10 Essential (primary) hypertension: Secondary | ICD-10-CM | POA: Diagnosis not present

## 2017-11-28 DIAGNOSIS — R710 Precipitous drop in hematocrit: Secondary | ICD-10-CM | POA: Diagnosis not present

## 2017-11-28 DIAGNOSIS — E538 Deficiency of other specified B group vitamins: Secondary | ICD-10-CM | POA: Diagnosis not present

## 2017-11-28 DIAGNOSIS — E119 Type 2 diabetes mellitus without complications: Secondary | ICD-10-CM

## 2017-11-28 DIAGNOSIS — R5382 Chronic fatigue, unspecified: Secondary | ICD-10-CM

## 2017-11-28 MED ORDER — VENLAFAXINE HCL ER 75 MG PO CP24: 75.0000 mg | ORAL_CAPSULE | Freq: Every day | ORAL | 3 refills | Status: DC

## 2017-11-28 NOTE — Progress Notes (Signed)
Subjective:    Patient ID: Kelly [name], female    DOB: January 24, 1951, 67 y.o.   MRN: [id number]  Insomnia     10/2016 Patient is a new patient here today with her son to establish care. Apparently she was recently admitted to the hospital with altered mental status. She was diagnosed with benzodiazepine intoxication and underlying dementia. She is here today for follow-up. She also apparently had low B12 levels which was confirmed in the hospital and she's been taking B12 tablets. Patient states that she is always been extremely anxious to stay at home alone. Recently her husband has been in a rehabilitation facility after surgery. Therefore she was taking Xanax prescribed by her previous primary care provider in addition to Ambien which she will take at night to help her sleep. Apparently she has been on Ambien for many many years due to chronic insomnia. Her son was unable to get her to answer the door one morning and broke in the back door to find his mother extremely intoxicated and confused. She was taken to the hospital. Their CAT scan showed mild diffuse cortical thickening. Ammonia levels were normal. RPR was normal. TSH was normal. Hemoglobin A1c was 5.4 PA B12 level was low at less than 200. Otherwise workup was unremarkable aside from some thrombocytopenia which is chronic and a mild anemia presumed to be secondary to B12 deficiency and iron deficiency. Since discharge from the hospital she is not taking any further Xanax. Her son has also not allowed her to have Ambien over the last month. She is here today for evaluation. I performed a Mini-Mental status exam. The patient is easily able to score is 5 out of 5. She also easily tells me the location. She is able to remember 3 objects easily. She can spell world in reverse and perform serial sevens. However the patient does have a noticeable word finding difficulty just in general communication. She has a difficult time remembering the names of  objects and remembering what she's trying to tell me. On clock drawing assessment, the patient is able to correctly draw the clock and the time. Takes for a prolonged period of time to do this. I then performed a [place] cognitive assessment and the patient had an extremely difficult time performing the pattern test alternating between letters and numbers in order. Even after I explained to the patient she has a difficult time drawing the areas appropriately. Therefore it appears that she has some mild dementia with some mild anomia.  Patient is requesting a refill on her Ambien.  She also has a history of lymphocytic colitis previously managed by Dr. [name]. She has not seen him in quite some time and she is not taking that the desonide that he had previously prescribed. As a result she has frequent diarrhea which could be contributing to her B12 deficiency the fact of malabsorptive diarrhea. She also has underlying history of nonalcoholic steatohepatitis complicated by thrombocytopenia. Platelet counts in the hospital between 70 and 80. She has a mild anemia with hemoglobin between 11 and 12. Her diabetes thankfully was well controlled at 5.4 and her TSH was within normal limits.  At that time, my plan was: The patient's memory loss was likely multifactorial. Given the cortical thickening on CT scan and some of the findings demonstrated today on her cognitive assessment she appears to have a very mild dementia. It is difficult for me to gauge this given the fact I am just meeting the patient today. I  believe this is likely exacerbated by the combination of benzodiazepines which she [place] have taken several and her Ambien. This could also be affected by her B12 deficiency. Therefore I recommended that we treat the patient's B12 deficiency with B12 injections parenterally 1000 g every month as I'm concerned that she does not absorb B12 well to the gastrointestinal tract due to her lymphocytic colitis. I've also  recommended that she start iron replacement. I spent more than 45 minutes with this patient discussing her diagnosis and evaluating her. I strongly recommended that we keep her away from any medication that can exacerbate her memory particularly benzodiazepines and Ambien. I believe a lot of her problems are exacerbated by anxiety. Therefore I recommended neuropsychological consultation to determine medication options. Perhaps trazodone would be a better option for this patient rather than Ambien. I would like to see her back in one month to discuss and follow-up. I refilled her other medications. No lab work was obtained at this visit but I reviewed the battery of lab work that was obtained at her most recent hospitalization:  11/07/16 Patient is under tremendous stress. Her husband is battling congestive heart failure and recently underwent an amputation. She states that he is constantly unloading his burdens and complaints onto her. However he is in denial about her medical problems. She feels very little support. She continues to battle anxiety and insomnia. She reports poor concentration, depression, anhedonia. She has very little energy. In the past she is tried and failed Zoloft, Wellbutrin, Prozac," several others". She denies any suicidal ideation. She denies any hallucinations or delusions. She is wanting to try Xanax again to help her sleep. Please see the previous office visit we discussed the problems of Xanax created for this patient.  AT that time, my plan was: I spent more than 25 minutes with this patient discussing her problems. I do believe she has mild dementia but also believe she could be suffering from depression. I would like to try the patient on Trintellix 5 mg a day for 1 week and gradually increased to 10 mg a day. Recheck in one month to see if there is benefit. Try to avoid high-risk medications such as benzodiazepines. She can continue use the trazodone as needed for  sleep.  03/14/17 Patient is here today for follow-up. I had asked the patient to return to recheck a fasting lipid panel, recheck her TSH, recheck her hemoglobin A1c to monitor the management of her diabetes, hypothyroidism, and to monitor for evidence of hyperlipidemia. However the patient upon arrival tells me that her depression is worse. Spent more than 30 minutes with the patient in discussion. He does not include her in any financial decisions. He does not seem to respect her take into consideration her desires or her plans.  He also apparently had an affair for many years and has never asked for forgiveness. They have never discussed this normal had any kind of resolution regarding this. I believe that her stressors in her marriage are contributing to her depression. From the standpoint of her dementia, she has very mild memory loss. Just talking to the patient, is difficult to notice. However she stopped coming for her B12 injections and I believe is due to the fact that she forgot them. She states that she didn't and that she was confused on how often she needed to come. I recommended that she come once a month moving forward for B12 injections. Her blood pressure today is adequately controlled.  She denies  any hypoglycemia. She denies any polyuria, polydipsia, or blurred vision. She denies any tachycardia or palpitations. She denies any substantial changes in her weight.  At that time, my plan was: Blood pressure today is well controlled. I'll make no changes in her blood pressure medication. She has a history of nonalcoholic steatohepatitis. Therefore I've asked the patient to return fasting for fasting lipid panel as well as a CMP to monitor for dyslipidemia and monitor her liver function test. At that time I'll also check a TSH to monitor management of her hypothyroidism. I would also like to check a hemoglobin A1c and a urine microalbumin. If her A1c stays less than 6.5, I would like to discontinue  Amaryl due to the risk of hypoglycemia area and spent more than 30 minutes with the patient discussing her situation. Recommended marriage counseling. I believe that until they seek therapy and come to some type of understanding and forgiveness that she will harm her resentment that would contribute to her depression moving forward. I believe that she feels disrespected and a lack of love in the marriage and this is contributing to her depression.   09/08/17 Patient discontinued trintellix in November.  However she denies any depression today.  Her blood pressure slightly elevated 146/80.  She is on losartan/hydrochlorothiazide 100/12.5.  She denies any chest pain.  She does report occasional shortness of breath.  At her last visit, I obtain an echocardiogram because of her worsening murmur which confirms moderate to severe aortic stenosis.  She is scheduled to see a cardiologist later this month to discuss treatment options.  Therefore I do not want to lower preload significantly despite the elevated blood pressures because of aortic stenosis.  Patient denies syncope or near syncope or palpitations.  She is not checking her blood sugars regularly.  However she denies any hypoglycemic episodes.  She denies any polyuria, polydipsia, or blurry vision.  She denies any myalgias or right upper quadrant pain.  Recently her insurance has denied her Ambien.  She takes Ambien at night to help her sleep.  She has tried trazodone in the past without benefit.  She is also tried Belsomra in the past and this did not work as well.  Therefore she is requesting that I try to complete a prior authorization for the medication.  She is also here today to recheck her TSH.  However upon medication list review, the patient is not sure that she is actually taking levothyroxine.  At that time, my plan was: Patient has discontinued her antidepressant.  However she is doing well and I see no reason to resume medication that the patient  does not seem to require.  I would like to check a hemoglobin A1c to monitor the management of her diabetes mellitus.  If her hemoglobin A1c is significantly below 6, I would discontinue glimepiride due to the risk of hypoglycemia.  I will also monitor her liver function test regarding her history of cirrhosis secondary to [facility].  Her blood pressure slightly elevated today however I do not want to lower preload significantly because of her aortic stenosis.  I will switch her losartan HCTZ to lisinopril HCTZ 20/12.5, 2 tablets once a day due to the recent recall of losartan and difficulty finding that medication.  I will check a TSH to ensure that the patient is compliant with her levothyroxine.  Patient will go home and verify whether or not she is actually been taking the medication.  I will also check a fasting lipid  panel.  Goal LDL cholesterol is less than 100 because of the diabetes.  Regarding her aortic stenosis, she is scheduled to see the cardiologist later this month.   11/28/17 Patient feels she needs to resume her antidepressant.  She reports anhedonia, daily depression.  She feels excessive feelings of guilt.  She has crying spells.  Recently she heard her son tell his children that his parents could not care for themselves more or less watch after his kids.  This broke her heart.  She understands that she has dementia however this is only exacerbated her depression.  She also reports chronic fatigue.  She has trouble sleeping.  She has trouble concentrating.  She saw significant benefit on the antidepressant however since stopping it, the depression seems to spiral out of control.  Past Medical History:  Diagnosis Date  . Adenomatous polyp   . Anxiety   . Aortic stenosis   . Arrhythmia   . Diabetes ([place])   . Diverticulosis   . Fatty liver   . Hyperlipidemia   . Hypertension   . NASH (nonalcoholic steatohepatitis)   . Obesity   . SOB (shortness of breath)   . Thrombocytopenia ([place])   .  Thyroid disease   . Vitamin D deficiency    Past Surgical History:  Procedure Laterality Date  . oopherectomy    . OVARIAN CYST REMOVAL     Current Outpatient Medications on File Prior to Visit  Medication Sig Dispense Refill  . ACCU-CHEK AVIVA PLUS test strip     . ACCU-CHEK SOFTCLIX LANCETS lancets     . atorvastatin (LIPITOR) 20 MG tablet Take 1 tablet (20 mg total) by mouth daily. 90 tablet 3  . Blood Glucose Monitoring Suppl (ACCU-CHEK AVIVA PLUS) w/Device KIT     . glimepiride (AMARYL) 1 MG tablet TAKE 1 TABLET EVERY DAY WITH BREAKFAST 90 tablet 1  . levothyroxine (SYNTHROID, LEVOTHROID) 75 MCG tablet Take 1 tablet (75 mcg total) by mouth daily. 90 tablet 3  . lisinopril-hydrochlorothiazide (ZESTORETIC) 20-12.5 MG tablet Take 2 tablets by mouth daily. Stop losartan hctz 180 tablet 3  . metFORMIN (GLUCOPHAGE-XR) 500 MG 24 hr tablet TAKE 2 TABLETS TWICE DAILY 360 tablet 1  . metoprolol tartrate (LOPRESSOR) 25 MG tablet Take 0.5 tablets (12.5 mg total) by mouth 2 (two) times daily. 180 tablet 3  . traZODone (DESYREL) 50 MG tablet TAKE 1 TABLET AT BEDTIME AS NEEDED FOR SLEEP 90 tablet 1  . zolpidem (AMBIEN) 5 MG tablet TAKE 1 TABLET BY MOUTH AT BEDTIME AS NEEDED FOR SLEEP 30 tablet 2   Current Facility-Administered Medications on File Prior to Visit  Medication Dose Route Frequency Provider Last Rate Last Dose  . cyanocobalamin ((VITAMIN B-12)) injection 1,000 mcg  1,000 mcg Subcutaneous Q30 days [name], MD   1,000 mcg at 09/08/17 1408   Allergies  Allergen Reactions  . Celecoxib   . Prednisone Itching    Heart rate increased  . Macrodantin [Nitrofurantoin Macrocrystal] Rash  . Penicillins Hives    Per family   Social History   Socioeconomic History  . Marital status: Married    Spouse name: Not on file  . Number of children: Not on file  . Years of education: Not on file  . Highest education level: Not on file  Occupational History  . Not on file  Social  Needs  . Financial resource strain: Not on file  . Food insecurity:    Worry: Not on file  Inability: Not on file  . Transportation needs:    Medical: Not on file    Non-medical: Not on file  Tobacco Use  . Smoking status: Current Every Day Smoker    Packs/day: 0.50    Types: Cigarettes  . Smokeless tobacco: Never Used  Substance and Sexual Activity  . Alcohol use: No  . Drug use: No  . Sexual activity: Not Currently  Lifestyle  . Physical activity:    Days per week: Not on file    Minutes per session: Not on file  . Stress: Not on file  Relationships  . Social connections:    Talks on phone: Not on file    Gets together: Not on file    Attends religious service: Not on file    Active member of club or organization: Not on file    Attends meetings of clubs or organizations: Not on file    Relationship status: Not on file  . Intimate partner violence:    Fear of current or ex partner: Not on file    Emotionally abused: Not on file    Physically abused: Not on file    Forced sexual activity: Not on file  Other Topics Concern  . Not on file  Social History Narrative  . Not on file   Family History  Problem Relation Age of Onset  . Lung cancer Father   . Heart disease Father        MI at age 45  . Alcohol abuse Father   . Alzheimer's disease Mother   . Dementia Mother   . Other Mother        twisted colon   . Stomach cancer Neg Hx   . Colon cancer Neg Hx       Review of Systems  Psychiatric/Behavioral: The patient has insomnia.   All other systems reviewed and are negative.      Objective:   Physical Exam  Constitutional: She is oriented to person, place, and time. She appears well-developed and well-nourished.  HENT:  Right Ear: External ear normal.  Left Ear: External ear normal.  Nose: Nose normal.  Mouth/Throat: Oropharynx is clear and moist. No oropharyngeal exudate.  Eyes: Pupils are equal, round, and reactive to light. Conjunctivae and EOM  are normal.  Neck: Neck supple. No JVD present. No thyromegaly present.  Cardiovascular: Normal rate and regular rhythm.  Murmur heard.  Systolic murmur is present with a grade of 3/6. Pulmonary/Chest: Effort normal and breath sounds normal. No respiratory distress. She has no wheezes. She has no rales.  Abdominal: Soft. Bowel sounds are normal. She exhibits no distension. There is no tenderness. There is no rebound and no guarding.  Musculoskeletal: She exhibits no edema.  Lymphadenopathy:    She has no cervical adenopathy.  Neurological: She is alert and oriented to person, place, and time. She has normal reflexes. No cranial nerve deficit. She exhibits normal muscle tone. Coordination normal.  Psychiatric: She has a normal mood and affect. Her behavior is normal. Judgment and thought content normal.  Vitals reviewed.         Assessment & Plan:  Type 2 diabetes mellitus without complication, without long-term current use of insulin ([place]) - Plan: CBC with Differential/Platelet, COMPLETE METABOLIC PANEL WITH GFR, Hemoglobin A1c, TSH  Essential hypertension  Hypothyroidism, unspecified type - Plan: TSH  Depression, recurrent (HCC)  Chronic fatigue  Patient's depression has worsened.  Begin Effexor XR 75 mg p.o. every morning.  Increase to 150  mg in 1 week.  Reassess in 5 weeks.  Given the fact her fatigue has worsened, I will check lab work to ensure that her thyroid is adequately treated and that there has not been a decline in her renal function, her CBC, or that her diabetes is out of control.

## 2017-11-29 LAB — CBC WITH DIFFERENTIAL/PLATELET
BASOS PCT: 1 %
Basophils Absolute: 90 cells/uL (ref 0–200)
EOS ABS: 243 {cells}/uL (ref 15–500)
Eosinophils Relative: 2.7 %
HEMATOCRIT: 28.6 % — AB (ref 35.0–45.0)
Hemoglobin: 9.5 g/dL — ABNORMAL LOW (ref 11.7–15.5)
LYMPHS ABS: 2592 {cells}/uL (ref 850–3900)
MCH: 26 pg — AB (ref 27.0–33.0)
MCHC: 33.2 g/dL (ref 32.0–36.0)
MCV: 78.4 fL — AB (ref 80.0–100.0)
MPV: 12.3 fL (ref 7.5–12.5)
Monocytes Relative: 8.9 %
NEUTROS PCT: 58.6 %
Neutro Abs: 5274 cells/uL (ref 1500–7800)
Platelets: 121 10*3/uL — ABNORMAL LOW (ref 140–400)
RBC: 3.65 10*6/uL — ABNORMAL LOW (ref 3.80–5.10)
RDW: 14 % (ref 11.0–15.0)
TOTAL LYMPHOCYTE: 28.8 %
WBC: 9 10*3/uL (ref 3.8–10.8)
WBCMIX: 801 {cells}/uL (ref 200–950)

## 2017-11-29 LAB — COMPLETE METABOLIC PANEL WITH GFR
AG RATIO: 1.4 (calc) (ref 1.0–2.5)
ALT: 8 U/L (ref 6–29)
AST: 18 U/L (ref 10–35)
Albumin: 3.8 g/dL (ref 3.6–5.1)
Alkaline phosphatase (APISO): 89 U/L (ref 33–130)
BILIRUBIN TOTAL: 0.6 mg/dL (ref 0.2–1.2)
BUN: 10 mg/dL (ref 7–25)
CALCIUM: 9.3 mg/dL (ref 8.6–10.4)
CHLORIDE: 108 mmol/L (ref 98–110)
CO2: 27 mmol/L (ref 20–32)
Creat: 0.85 mg/dL (ref 0.50–0.99)
GFR, EST AFRICAN AMERICAN: 81 mL/min/{1.73_m2} (ref >=60)
GFR, EST NON AFRICAN AMERICAN: 70 mL/min/{1.73_m2} (ref >=60)
GLOBULIN: 2.8 g/dL (ref 1.9–3.7)
Glucose, Bld: 146 mg/dL — ABNORMAL HIGH (ref 65–99)
POTASSIUM: 4.6 mmol/L (ref 3.5–5.3)
SODIUM: 143 mmol/L (ref 135–146)
TOTAL PROTEIN: 6.6 g/dL (ref 6.1–8.1)

## 2017-11-29 LAB — HEMOGLOBIN A1C
Hgb A1c MFr Bld: 6.1 % of total Hgb — ABNORMAL HIGH (ref <5.7)
Mean Plasma Glucose: 128 (calc)
eAG (mmol/L): 7.1 (calc)

## 2017-11-29 LAB — TSH: TSH: 1.08 mIU/L (ref 0.40–4.50)

## 2017-12-02 ENCOUNTER — Other Ambulatory Visit: Payer: Self-pay | Admitting: Family Medicine

## 2017-12-02 DIAGNOSIS — F330 Major depressive disorder, recurrent, mild: Secondary | ICD-10-CM

## 2017-12-02 LAB — IRON,TIBC AND FERRITIN PANEL
%SAT: 10 % (calc) — ABNORMAL LOW (ref 11–50)
FERRITIN: 9 ng/mL — AB (ref 20–288)
Iron: 39 ug/dL — ABNORMAL LOW (ref 45–160)
TIBC: 395 mcg/dL (calc) (ref 250–450)

## 2017-12-02 LAB — TEST AUTHORIZATION

## 2017-12-02 LAB — VITAMIN B12: Vitamin B-12: 563 pg/mL (ref 200–1100)

## 2017-12-02 NOTE — Telephone Encounter (Signed)
Requesting refill      LOV: 11/28/17  LRF:  09/08/17

## 2017-12-05 ENCOUNTER — Other Ambulatory Visit: Payer: Medicare HMO

## 2017-12-05 DIAGNOSIS — E611 Iron deficiency: Secondary | ICD-10-CM

## 2017-12-05 DIAGNOSIS — R5382 Chronic fatigue, unspecified: Secondary | ICD-10-CM

## 2017-12-06 LAB — FECAL GLOBIN BY IMMUNOCHEMISTRY
FECAL GLOBIN RESULT: DETECTED — AB
MICRO NUMBER: 90543067
SPECIMEN QUALITY:: ADEQUATE

## 2017-12-09 ENCOUNTER — Other Ambulatory Visit: Payer: Medicare HMO

## 2017-12-10 ENCOUNTER — Other Ambulatory Visit: Payer: Self-pay | Admitting: Family Medicine

## 2017-12-10 DIAGNOSIS — D649 Anemia, unspecified: Secondary | ICD-10-CM

## 2017-12-10 DIAGNOSIS — R195 Other fecal abnormalities: Secondary | ICD-10-CM

## 2017-12-12 ENCOUNTER — Encounter: Payer: Self-pay | Admitting: Family Medicine

## 2017-12-12 ENCOUNTER — Ambulatory Visit (INDEPENDENT_AMBULATORY_CARE_PROVIDER_SITE_OTHER): Payer: Medicare HMO | Admitting: Family Medicine

## 2017-12-12 VITALS — BP 130/62 | HR 62 | Temp 98.3°F | Resp 18 | Ht 64.5 in | Wt 213.0 lb

## 2017-12-12 DIAGNOSIS — R5382 Chronic fatigue, unspecified: Secondary | ICD-10-CM | POA: Diagnosis not present

## 2017-12-12 DIAGNOSIS — D509 Iron deficiency anemia, unspecified: Secondary | ICD-10-CM | POA: Diagnosis not present

## 2017-12-12 DIAGNOSIS — R05 Cough: Secondary | ICD-10-CM | POA: Diagnosis not present

## 2017-12-12 DIAGNOSIS — R059 Cough, unspecified: Secondary | ICD-10-CM

## 2017-12-12 MED ORDER — LOSARTAN POTASSIUM 100 MG PO TABS: 100.0000 mg | ORAL_TABLET | Freq: Every day | ORAL | 3 refills | Status: DC

## 2017-12-12 NOTE — Progress Notes (Signed)
Subjective:    Patient ID: Kelly [name], female    DOB: January 24, 1951, 67 y.o.   MRN: [id number]  Insomnia     10/2016 Patient is a new patient here today with her son to establish care. Apparently she was recently admitted to the hospital with altered mental status. She was diagnosed with benzodiazepine intoxication and underlying dementia. She is here today for follow-up. She also apparently had low B12 levels which was confirmed in the hospital and she's been taking B12 tablets. Patient states that she is always been extremely anxious to stay at home alone. Recently her husband has been in a rehabilitation facility after surgery. Therefore she was taking Xanax prescribed by her previous primary care provider in addition to Ambien which she will take at night to help her sleep. Apparently she has been on Ambien for many many years due to chronic insomnia. Her son was unable to get her to answer the door one morning and broke in the back door to find his mother extremely intoxicated and confused. She was taken to the hospital. Their CAT scan showed mild diffuse cortical thickening. Ammonia levels were normal. RPR was normal. TSH was normal. Hemoglobin A1c was 5.4 PA B12 level was low at less than 200. Otherwise workup was unremarkable aside from some thrombocytopenia which is chronic and a mild anemia presumed to be secondary to B12 deficiency and iron deficiency. Since discharge from the hospital she is not taking any further Xanax. Her son has also not allowed her to have Ambien over the last month. She is here today for evaluation. I performed a Mini-Mental status exam. The patient is easily able to score is 5 out of 5. She also easily tells me the location. She is able to remember 3 objects easily. She can spell world in reverse and perform serial sevens. However the patient does have a noticeable word finding difficulty just in general communication. She has a difficult time remembering the names of  objects and remembering what she's trying to tell me. On clock drawing assessment, the patient is able to correctly draw the clock and the time. Takes for a prolonged period of time to do this. I then performed a [place] cognitive assessment and the patient had an extremely difficult time performing the pattern test alternating between letters and numbers in order. Even after I explained to the patient she has a difficult time drawing the areas appropriately. Therefore it appears that she has some mild dementia with some mild anomia.  Patient is requesting a refill on her Ambien.  She also has a history of lymphocytic colitis previously managed by Dr. [name]. She has not seen him in quite some time and she is not taking that the desonide that he had previously prescribed. As a result she has frequent diarrhea which could be contributing to her B12 deficiency the fact of malabsorptive diarrhea. She also has underlying history of nonalcoholic steatohepatitis complicated by thrombocytopenia. Platelet counts in the hospital between 70 and 80. She has a mild anemia with hemoglobin between 11 and 12. Her diabetes thankfully was well controlled at 5.4 and her TSH was within normal limits.  At that time, my plan was: The patient's memory loss was likely multifactorial. Given the cortical thickening on CT scan and some of the findings demonstrated today on her cognitive assessment she appears to have a very mild dementia. It is difficult for me to gauge this given the fact I am just meeting the patient today. I  believe this is likely exacerbated by the combination of benzodiazepines which she Brugh have taken several and her Ambien. This could also be affected by her B12 deficiency. Therefore I recommended that we treat the patient's B12 deficiency with B12 injections parenterally 1000 g every month as I'm concerned that she does not absorb B12 well to the gastrointestinal tract due to her lymphocytic colitis. I've also  recommended that she start iron replacement. I spent more than 45 minutes with this patient discussing her diagnosis and evaluating her. I strongly recommended that we keep her away from any medication that can exacerbate her memory particularly benzodiazepines and Ambien. I believe a lot of her problems are exacerbated by anxiety. Therefore I recommended neuropsychological consultation to determine medication options. Perhaps trazodone would be a better option for this patient rather than Ambien. I would like to see her back in one month to discuss and follow-up. I refilled her other medications. No lab work was obtained at this visit but I reviewed the battery of lab work that was obtained at her most recent hospitalization:  11/07/16 Patient is under tremendous stress. Her husband is battling congestive heart failure and recently underwent an amputation. She states that he is constantly unloading his burdens and complaints onto her. However he is in denial about her medical problems. She feels very little support. She continues to battle anxiety and insomnia. She reports poor concentration, depression, anhedonia. She has very little energy. In the past she is tried and failed Zoloft, Wellbutrin, Prozac," several others". She denies any suicidal ideation. She denies any hallucinations or delusions. She is wanting to try Xanax again to help her sleep. Please see the previous office visit we discussed the problems of Xanax created for this patient.  AT that time, my plan was: I spent more than 25 minutes with this patient discussing her problems. I do believe she has mild dementia but also believe she could be suffering from depression. I would like to try the patient on Trintellix 5 mg a day for 1 week and gradually increased to 10 mg a day. Recheck in one month to see if there is benefit. Try to avoid high-risk medications such as benzodiazepines. She can continue use the trazodone as needed for  sleep.  03/14/17 Patient is here today for follow-up. I had asked the patient to return to recheck a fasting lipid panel, recheck her TSH, recheck her hemoglobin A1c to monitor the management of her diabetes, hypothyroidism, and to monitor for evidence of hyperlipidemia. However the patient upon arrival tells me that her depression is worse. Spent more than 30 minutes with the patient in discussion. He does not include her in any financial decisions. He does not seem to respect her take into consideration her desires or her plans.  He also apparently had an affair for many years and has never asked for forgiveness. They have never discussed this normal had any kind of resolution regarding this. I believe that her stressors in her marriage are contributing to her depression. From the standpoint of her dementia, she has very mild memory loss. Just talking to the patient, is difficult to notice. However she stopped coming for her B12 injections and I believe is due to the fact that she forgot them. She states that she didn't and that she was confused on how often she needed to come. I recommended that she come once a month moving forward for B12 injections. Her blood pressure today is adequately controlled.  She denies  any hypoglycemia. She denies any polyuria, polydipsia, or blurred vision. She denies any tachycardia or palpitations. She denies any substantial changes in her weight.  At that time, my plan was: Blood pressure today is well controlled. I'll make no changes in her blood pressure medication. She has a history of nonalcoholic steatohepatitis. Therefore I've asked the patient to return fasting for fasting lipid panel as well as a CMP to monitor for dyslipidemia and monitor her liver function test. At that time I'll also check a TSH to monitor management of her hypothyroidism. I would also like to check a hemoglobin A1c and a urine microalbumin. If her A1c stays less than 6.5, I would like to discontinue  Amaryl due to the risk of hypoglycemia area and spent more than 30 minutes with the patient discussing her situation. Recommended marriage counseling. I believe that until they seek therapy and come to some type of understanding and forgiveness that she will harm her resentment that would contribute to her depression moving forward. I believe that she feels disrespected and a lack of love in the marriage and this is contributing to her depression.   09/08/17 Patient discontinued trintellix in November.  However she denies any depression today.  Her blood pressure slightly elevated 146/80.  She is on losartan/hydrochlorothiazide 100/12.5.  She denies any chest pain.  She does report occasional shortness of breath.  At her last visit, I obtain an echocardiogram because of her worsening murmur which confirms moderate to severe aortic stenosis.  She is scheduled to see a cardiologist later this month to discuss treatment options.  Therefore I do not want to lower preload significantly despite the elevated blood pressures because of aortic stenosis.  Patient denies syncope or near syncope or palpitations.  She is not checking her blood sugars regularly.  However she denies any hypoglycemic episodes.  She denies any polyuria, polydipsia, or blurry vision.  She denies any myalgias or right upper quadrant pain.  Recently her insurance has denied her Ambien.  She takes Ambien at night to help her sleep.  She has tried trazodone in the past without benefit.  She is also tried Belsomra in the past and this did not work as well.  Therefore she is requesting that I try to complete a prior authorization for the medication.  She is also here today to recheck her TSH.  However upon medication list review, the patient is not sure that she is actually taking levothyroxine.  At that time, my plan was: Patient has discontinued her antidepressant.  However she is doing well and I see no reason to resume medication that the patient  does not seem to require.  I would like to check a hemoglobin A1c to monitor the management of her diabetes mellitus.  If her hemoglobin A1c is significantly below 6, I would discontinue glimepiride due to the risk of hypoglycemia.  I will also monitor her liver function test regarding her history of cirrhosis secondary to [facility].  Her blood pressure slightly elevated today however I do not want to lower preload significantly because of her aortic stenosis.  I will switch her losartan HCTZ to lisinopril HCTZ 20/12.5, 2 tablets once a day due to the recent recall of losartan and difficulty finding that medication.  I will check a TSH to ensure that the patient is compliant with her levothyroxine.  Patient will go home and verify whether or not she is actually been taking the medication.  I will also check a fasting lipid  panel.  Goal LDL cholesterol is less than 100 because of the diabetes.  Regarding her aortic stenosis, she is scheduled to see the cardiologist later this month.   11/28/17 Patient feels she needs to resume her antidepressant.  She reports anhedonia, daily depression.  She feels excessive feelings of guilt.  She has crying spells.  Recently she heard her son tell his children that his parents could not care for themselves more or less watch after his kids.  This broke her heart.  She understands that she has dementia however this is only exacerbated her depression.  She also reports chronic fatigue.  She has trouble sleeping.  She has trouble concentrating.  She saw significant benefit on the antidepressant however since stopping it, the depression seems to spiral out of control.   At that time, my plan was:  Patient's depression has worsened.  Begin Effexor XR 75 mg p.o. every morning.  Increase to 150 mg in 1 week.  Reassess in 5 weeks.  Given the fact her fatigue has worsened, I will check lab work to ensure that her thyroid is adequately treated and that there has not been a decline in her  renal function, her CBC, or that her diabetes is out of control.  12/12/17 Lab on 12/05/2017  Component Date Value Ref Range Status  . MICRO NUMBER: 12/05/2017 [id number]   Final  . SPECIMEN QUALITY: 12/05/2017 ADEQUATE   Final  . Source: 12/05/2017 NOT GIVEN   Final  . STATUS: 12/05/2017 FINAL   Final  . FECAL GLOBIN RESULT: 12/05/2017 Detected*  Final   Detected  Office Visit on 11/28/2017  Component Date Value Ref Range Status  . WBC 11/28/2017 9.0  3.8 - 10.8 Thousand/uL Final  . RBC 11/28/2017 3.65* 3.80 - 5.10 Million/uL Final  . Hemoglobin 11/28/2017 9.5* 11.7 - 15.5 g/dL Final  . HCT 11/28/2017 28.6* 35.0 - 45.0 % Final  . MCV 11/28/2017 78.4* 80.0 - 100.0 fL Final  . MCH 11/28/2017 26.0* 27.0 - 33.0 pg Final  . MCHC 11/28/2017 33.2  32.0 - 36.0 g/dL Final  . RDW 11/28/2017 14.0  11.0 - 15.0 % Final  . Platelets 11/28/2017 121* 140 - 400 Thousand/uL Final  . MPV 11/28/2017 12.3  7.5 - 12.5 fL Final  . Neutro Abs 11/28/2017 5,274  1,500 - 7,800 cells/uL Final  . Lymphs Abs 11/28/2017 2,592  850 - 3,900 cells/uL Final  . WBC mixed population 11/28/2017 801  200 - 950 cells/uL Final  . Eosinophils Absolute 11/28/2017 243  15 - 500 cells/uL Final  . Basophils Absolute 11/28/2017 90  0 - 200 cells/uL Final  . Neutrophils Relative % 11/28/2017 58.6  % Final  . Total Lymphocyte 11/28/2017 28.8  % Final  . Monocytes Relative 11/28/2017 8.9  % Final  . Eosinophils Relative 11/28/2017 2.7  % Final  . Basophils Relative 11/28/2017 1.0  % Final  . Glucose, Bld 11/28/2017 146* 65 - 99 mg/dL Final   Comment: .            Fasting reference interval . For someone without known diabetes, a glucose value >125 mg/dL indicates that they Arredondo have diabetes and this should be confirmed with a follow-up test. .   . BUN 11/28/2017 10  7 - 25 mg/dL Final  . Creat 11/28/2017 0.85  0.50 - 0.99 mg/dL Final   Comment: For patients >59 years of age, the reference limit for Creatinine is  approximately 13% higher for people identified as African-American. Marland Kitchen   Marland Kitchen  GFR, Est Non African American 11/28/2017 70  > OR = 60 mL/min/1.94m Final  . GFR, Est African American 11/28/2017 81  > OR = 60 mL/min/1.777mFinal  . BUN/Creatinine Ratio 0466/44/0347OT APPLICABLE  6 - 22 (calc) Final  . Sodium 11/28/2017 143  135 - 146 mmol/L Final  . Potassium 11/28/2017 4.6  3.5 - 5.3 mmol/L Final  . Chloride 11/28/2017 108  98 - 110 mmol/L Final  . CO2 11/28/2017 27  20 - 32 mmol/L Final  . Calcium 11/28/2017 9.3  8.6 - 10.4 mg/dL Final  . Total Protein 11/28/2017 6.6  6.1 - 8.1 g/dL Final  . Albumin 11/28/2017 3.8  3.6 - 5.1 g/dL Final  . Globulin 11/28/2017 2.8  1.9 - 3.7 g/dL (calc) Final  . AG Ratio 11/28/2017 1.4  1.0 - 2.5 (calc) Final  . Total Bilirubin 11/28/2017 0.6  0.2 - 1.2 mg/dL Final  . Alkaline phosphatase (APISO) 11/28/2017 89  33 - 130 U/L Final  . AST 11/28/2017 18  10 - 35 U/L Final  . ALT 11/28/2017 8  6 - 29 U/L Final  . Hgb A1c MFr Bld 11/28/2017 6.1* <5.7 % of total Hgb Final   Comment: For someone without known diabetes, a hemoglobin  A1c value between 5.7% and 6.4% is consistent with prediabetes and should be confirmed with a  follow-up test. . For someone with known diabetes, a value <7% indicates that their diabetes is well controlled. A1c targets should be individualized based on duration of diabetes, age, comorbid conditions, and other considerations. . This assay result is consistent with an increased risk of diabetes. . Currently, no consensus exists regarding use of hemoglobin A1c for diagnosis of diabetes for children. .   . Mean Plasma Glucose 11/28/2017 128  (calc) Final  . eAG (mmol/L) 11/28/2017 7.1  (calc) Final  . TSH 11/28/2017 1.08  0.40 - 4.50 mIU/L Final  . TEST NAME: 11/28/2017 IRON, TIBC AND FERRITIN PANEL   Final  . TEST CODE: 11/28/2017 [id number]   Final  . CLIENT CONTACT: 11/28/2017 LAJonetta Osgood Final  . REPORT ALWAYS  MESSAGE SIGNATURE 11/28/2017    Final   Comment: . The laboratory testing on this patient was verbally requested or confirmed by the ordering physician or his or her authorized representative after contact with an employee of [employer] regulations require that we maintain on file written authorization for all laboratory testing.  Accordingly we are asking that the ordering physician or his or her authorized representative sign a copy of this report and promptly return it to the client service representative. . . Signature:____________________________________________________ . Please fax this signed page to [phone] return it via your [employer].   . Vitamin B-12 11/28/2017 563  200 - 1,100 pg/mL Final  . Iron 11/28/2017 39* 45 - 160 mcg/dL Final  . TIBC 11/28/2017 395  250 - 450 mcg/dL (calc) Final  . %SAT 11/28/2017 10* 11 - 50 % (calc) Final  . Ferritin 11/28/2017 9* 20 - 288 ng/mL Final   As shown above, the patient was found to have significant iron deficiency anemia likely contributing to her fatigue.  Her stools were also positive for blood.  She has been referred to GI for evaluation of her iron deficiency anemia and guaiac positive stool.  Today she is here complaining of fatigue despite taking iron.  She is complaining of a chronic cough that has been going on for many weeks.  She has  been battling a cough off and on since December.  Cough is nonproductive but does seem to be worse at night.  She is also having some right upper quadrant abdominal pain that comes and goes.  It is mild.  There are no exacerbating or alleviating factors.  She is afebrile.  She denies any melena or hematochezia.  She denies any hemoptysis Past Medical History:  Diagnosis Date  . Adenomatous polyp   . Anxiety   . Aortic stenosis   . Arrhythmia   . Diabetes ([place])   . Diverticulosis   . Fatty liver   . Hyperlipidemia   . Hypertension   . NASH (nonalcoholic  steatohepatitis)   . Obesity   . SOB (shortness of breath)   . Thrombocytopenia ([place])   . Thyroid disease   . Vitamin D deficiency    Past Surgical History:  Procedure Laterality Date  . oopherectomy    . OVARIAN CYST REMOVAL     Current Outpatient Medications on File Prior to Visit  Medication Sig Dispense Refill  . ACCU-CHEK AVIVA PLUS test strip     . ACCU-CHEK SOFTCLIX LANCETS lancets     . atorvastatin (LIPITOR) 20 MG tablet Take 1 tablet (20 mg total) by mouth daily. 90 tablet 3  . Blood Glucose Monitoring Suppl (ACCU-CHEK AVIVA PLUS) w/Device KIT     . glimepiride (AMARYL) 1 MG tablet TAKE 1 TABLET EVERY DAY WITH BREAKFAST 90 tablet 1  . levothyroxine (SYNTHROID, LEVOTHROID) 75 MCG tablet Take 1 tablet (75 mcg total) by mouth daily. 90 tablet 3  . lisinopril-hydrochlorothiazide (ZESTORETIC) 20-12.5 MG tablet Take 2 tablets by mouth daily. Stop losartan hctz 180 tablet 3  . metFORMIN (GLUCOPHAGE-XR) 500 MG 24 hr tablet TAKE 2 TABLETS TWICE DAILY 360 tablet 1  . metoprolol tartrate (LOPRESSOR) 25 MG tablet TAKE 1 TABLET TWICE DAILY 180 tablet 3  . traZODone (DESYREL) 50 MG tablet TAKE 1 TABLET AT BEDTIME AS NEEDED FOR SLEEP 90 tablet 1  . venlafaxine XR (EFFEXOR XR) 75 MG 24 hr capsule Take 1 capsule (75 mg total) by mouth daily with breakfast. 1 a day for 1 week then 2 tabs a day thereafter 60 capsule 3  . zolpidem (AMBIEN) 5 MG tablet TAKE 1 TABLET BY MOUTH EVERY DAY AT BEDTIME AS NEEDED FOR SLEEP 30 tablet 2   Current Facility-Administered Medications on File Prior to Visit  Medication Dose Route Frequency Provider Last Rate Last Dose  . cyanocobalamin ((VITAMIN B-12)) injection 1,000 mcg  1,000 mcg Subcutaneous Q30 days [name], MD   1,000 mcg at 09/08/17 1408   Allergies  Allergen Reactions  . Celecoxib   . Prednisone Itching    Heart rate increased  . Macrodantin [Nitrofurantoin Macrocrystal] Rash  . Penicillins Hives    Per family   Social History    Socioeconomic History  . Marital status: Married    Spouse name: Not on file  . Number of children: Not on file  . Years of education: Not on file  . Highest education level: Not on file  Occupational History  . Not on file  Social Needs  . Financial resource strain: Not on file  . Food insecurity:    Worry: Not on file    Inability: Not on file  . Transportation needs:    Medical: Not on file    Non-medical: Not on file  Tobacco Use  . Smoking status: Current Every Day Smoker    Packs/day: 0.50  Types: Cigarettes  . Smokeless tobacco: Never Used  Substance and Sexual Activity  . Alcohol use: No  . Drug use: No  . Sexual activity: Not Currently  Lifestyle  . Physical activity:    Days per week: Not on file    Minutes per session: Not on file  . Stress: Not on file  Relationships  . Social connections:    Talks on phone: Not on file    Gets together: Not on file    Attends religious service: Not on file    Active member of club or organization: Not on file    Attends meetings of clubs or organizations: Not on file    Relationship status: Not on file  . Intimate partner violence:    Fear of current or ex partner: Not on file    Emotionally abused: Not on file    Physically abused: Not on file    Forced sexual activity: Not on file  Other Topics Concern  . Not on file  Social History Narrative  . Not on file   Family History  Problem Relation Age of Onset  . Lung cancer Father   . Heart disease Father        MI at age 43  . Alcohol abuse Father   . Alzheimer's disease Mother   . Dementia Mother   . Other Mother        twisted colon   . Stomach cancer Neg Hx   . Colon cancer Neg Hx       Review of Systems  Psychiatric/Behavioral: The patient has insomnia.   All other systems reviewed and are negative.      Objective:   Physical Exam  Constitutional: She is oriented to person, place, and time. She appears well-developed and well-nourished.   HENT:  Right Ear: External ear normal.  Left Ear: External ear normal.  Nose: Nose normal.  Mouth/Throat: Oropharynx is clear and moist. No oropharyngeal exudate.  Eyes: Pupils are equal, round, and reactive to light. Conjunctivae and EOM are normal.  Neck: Neck supple. No JVD present. No thyromegaly present.  Cardiovascular: Normal rate and regular rhythm.  Murmur heard.  Systolic murmur is present with a grade of 3/6. Pulmonary/Chest: Effort normal and breath sounds normal. No respiratory distress. She has no wheezes. She has no rales.  Abdominal: Soft. Bowel sounds are normal. She exhibits no distension. There is no tenderness. There is no rebound and no guarding.  Musculoskeletal: She exhibits no edema.  Lymphadenopathy:    She has no cervical adenopathy.  Neurological: She is alert and oriented to person, place, and time. She has normal reflexes. No cranial nerve deficit. She exhibits normal muscle tone. Coordination normal.  Psychiatric: She has a normal mood and affect. Her behavior is normal. Judgment and thought content normal.  Vitals reviewed.         Assessment & Plan:  Cough - Plan: DG Chest 2 View  Chronic fatigue  Iron deficiency anemia, unspecified iron deficiency anemia type I believe her fatigue is multifactorial and likely related primarily to her severe iron deficiency anemia.  Patient is taking iron.  I explained to the patient will take at least 1 month for her blood counts to build back.  I would like to repeat a CBC in 1 month on the iron to see if she is improving.  Also I am not certain why her stool is guaiac positive.  I explained to the patient that we need her to  see GI for endoscopy to rule out a bleeding ulcer or other source of GI blood loss that [place] be explaining her right upper quadrant discomfort.  Given her cough, I would proceed with a chest x-ray however I believe her cough Lupa be either due to acid reflux given its exacerbation while lying  supine, or possibly due to her ACE inhibitor.  I will have the patient discontinue lisinopril HCTZ and replace it with losartan 100 mg a day.  I have also recommended that she start taking Prilosec 20 mg a day over-the-counter and see if her symptoms improve.  If right-sided abdominal pain does not improve, consider work-up of biliary tract disease/cholelithiasis.

## 2017-12-15 ENCOUNTER — Other Ambulatory Visit: Payer: Self-pay | Admitting: Family Medicine

## 2017-12-17 ENCOUNTER — Ambulatory Visit
Admission: RE | Admit: 2017-12-17 | Discharge: 2017-12-17 | Disposition: A | Discharge status: Home / Self Care | Payer: Medicare HMO | Source: Ambulatory Visit | Attending: Family Medicine | Admitting: Family Medicine

## 2017-12-17 DIAGNOSIS — R05 Cough: Secondary | ICD-10-CM | POA: Diagnosis not present

## 2017-12-17 DIAGNOSIS — R059 Cough, unspecified: Secondary | ICD-10-CM

## 2017-12-19 ENCOUNTER — Ambulatory Visit
Admission: RE | Admit: 2017-12-19 | Discharge: 2017-12-19 | Disposition: A | Discharge status: Home / Self Care | Payer: Medicare HMO | Source: Ambulatory Visit | Attending: Family Medicine | Admitting: Family Medicine

## 2017-12-19 ENCOUNTER — Ambulatory Visit (INDEPENDENT_AMBULATORY_CARE_PROVIDER_SITE_OTHER): Payer: Medicare HMO | Admitting: Family Medicine

## 2017-12-19 ENCOUNTER — Encounter: Payer: Self-pay | Admitting: Family Medicine

## 2017-12-19 VITALS — BP 142/78 | HR 68 | Temp 97.9°F | Resp 22 | Ht 64.5 in | Wt 219.0 lb

## 2017-12-19 DIAGNOSIS — R635 Abnormal weight gain: Secondary | ICD-10-CM | POA: Diagnosis not present

## 2017-12-19 DIAGNOSIS — I350 Nonrheumatic aortic (valve) stenosis: Secondary | ICD-10-CM | POA: Diagnosis not present

## 2017-12-19 DIAGNOSIS — R05 Cough: Secondary | ICD-10-CM | POA: Diagnosis not present

## 2017-12-19 DIAGNOSIS — R0602 Shortness of breath: Secondary | ICD-10-CM

## 2017-12-19 DIAGNOSIS — J810 Acute pulmonary edema: Secondary | ICD-10-CM | POA: Diagnosis not present

## 2017-12-19 MED ORDER — FUROSEMIDE 40 MG PO TABS
40.0000 mg | ORAL_TABLET | Freq: Two times a day (BID) | ORAL | 3 refills | Status: DC
Start: 2017-12-19 — End: 2018-03-13

## 2017-12-19 MED ORDER — POTASSIUM CHLORIDE CRYS ER 20 MEQ PO TBCR: 20.0000 meq | EXTENDED_RELEASE_TABLET | Freq: Every day | ORAL | 0 refills | Status: DC

## 2017-12-19 NOTE — Progress Notes (Signed)
Subjective:    Patient ID: Kelly [name], female    DOB: January 24, 1951, 67 y.o.   MRN: [id number]  Insomnia     10/2016 Patient is a new patient here today with her son to establish care. Apparently she was recently admitted to the hospital with altered mental status. She was diagnosed with benzodiazepine intoxication and underlying dementia. She is here today for follow-up. She also apparently had low B12 levels which was confirmed in the hospital and she's been taking B12 tablets. Patient states that she is always been extremely anxious to stay at home alone. Recently her husband has been in a rehabilitation facility after surgery. Therefore she was taking Xanax prescribed by her previous primary care provider in addition to Ambien which she will take at night to help her sleep. Apparently she has been on Ambien for many many years due to chronic insomnia. Her son was unable to get her to answer the door one morning and broke in the back door to find his mother extremely intoxicated and confused. She was taken to the hospital. Their CAT scan showed mild diffuse cortical thickening. Ammonia levels were normal. RPR was normal. TSH was normal. Hemoglobin A1c was 5.4 PA B12 level was low at less than 200. Otherwise workup was unremarkable aside from some thrombocytopenia which is chronic and a mild anemia presumed to be secondary to B12 deficiency and iron deficiency. Since discharge from the hospital she is not taking any further Xanax. Her son has also not allowed her to have Ambien over the last month. She is here today for evaluation. I performed a Mini-Mental status exam. The patient is easily able to score is 5 out of 5. She also easily tells me the location. She is able to remember 3 objects easily. She can spell world in reverse and perform serial sevens. However the patient does have a noticeable word finding difficulty just in general communication. She has a difficult time remembering the names of  objects and remembering what she's trying to tell me. On clock drawing assessment, the patient is able to correctly draw the clock and the time. Takes for a prolonged period of time to do this. I then performed a [place] cognitive assessment and the patient had an extremely difficult time performing the pattern test alternating between letters and numbers in order. Even after I explained to the patient she has a difficult time drawing the areas appropriately. Therefore it appears that she has some mild dementia with some mild anomia.  Patient is requesting a refill on her Ambien.  She also has a history of lymphocytic colitis previously managed by Dr. [name]. She has not seen him in quite some time and she is not taking that the desonide that he had previously prescribed. As a result she has frequent diarrhea which could be contributing to her B12 deficiency the fact of malabsorptive diarrhea. She also has underlying history of nonalcoholic steatohepatitis complicated by thrombocytopenia. Platelet counts in the hospital between 70 and 80. She has a mild anemia with hemoglobin between 11 and 12. Her diabetes thankfully was well controlled at 5.4 and her TSH was within normal limits.  At that time, my plan was: The patient's memory loss was likely multifactorial. Given the cortical thickening on CT scan and some of the findings demonstrated today on her cognitive assessment she appears to have a very mild dementia. It is difficult for me to gauge this given the fact I am just meeting the patient today. I  believe this is likely exacerbated by the combination of benzodiazepines which she Sahm have taken several and her Ambien. This could also be affected by her B12 deficiency. Therefore I recommended that we treat the patient's B12 deficiency with B12 injections parenterally 1000 g every month as I'm concerned that she does not absorb B12 well to the gastrointestinal tract due to her lymphocytic colitis. I've also  recommended that she start iron replacement. I spent more than 45 minutes with this patient discussing her diagnosis and evaluating her. I strongly recommended that we keep her away from any medication that can exacerbate her memory particularly benzodiazepines and Ambien. I believe a lot of her problems are exacerbated by anxiety. Therefore I recommended neuropsychological consultation to determine medication options. Perhaps trazodone would be a better option for this patient rather than Ambien. I would like to see her back in one month to discuss and follow-up. I refilled her other medications. No lab work was obtained at this visit but I reviewed the battery of lab work that was obtained at her most recent hospitalization:  11/07/16 Patient is under tremendous stress. Her husband is battling congestive heart failure and recently underwent an amputation. She states that he is constantly unloading his burdens and complaints onto her. However he is in denial about her medical problems. She feels very little support. She continues to battle anxiety and insomnia. She reports poor concentration, depression, anhedonia. She has very little energy. In the past she is tried and failed Zoloft, Wellbutrin, Prozac," several others". She denies any suicidal ideation. She denies any hallucinations or delusions. She is wanting to try Xanax again to help her sleep. Please see the previous office visit we discussed the problems of Xanax created for this patient.  AT that time, my plan was: I spent more than 25 minutes with this patient discussing her problems. I do believe she has mild dementia but also believe she could be suffering from depression. I would like to try the patient on Trintellix 5 mg a day for 1 week and gradually increased to 10 mg a day. Recheck in one month to see if there is benefit. Try to avoid high-risk medications such as benzodiazepines. She can continue use the trazodone as needed for  sleep.  03/14/17 Patient is here today for follow-up. I had asked the patient to return to recheck a fasting lipid panel, recheck her TSH, recheck her hemoglobin A1c to monitor the management of her diabetes, hypothyroidism, and to monitor for evidence of hyperlipidemia. However the patient upon arrival tells me that her depression is worse. Spent more than 30 minutes with the patient in discussion. He does not include her in any financial decisions. He does not seem to respect her take into consideration her desires or her plans.  He also apparently had an affair for many years and has never asked for forgiveness. They have never discussed this normal had any kind of resolution regarding this. I believe that her stressors in her marriage are contributing to her depression. From the standpoint of her dementia, she has very mild memory loss. Just talking to the patient, is difficult to notice. However she stopped coming for her B12 injections and I believe is due to the fact that she forgot them. She states that she didn't and that she was confused on how often she needed to come. I recommended that she come once a month moving forward for B12 injections. Her blood pressure today is adequately controlled.  She denies  any hypoglycemia. She denies any polyuria, polydipsia, or blurred vision. She denies any tachycardia or palpitations. She denies any substantial changes in her weight.  At that time, my plan was: Blood pressure today is well controlled. I'll make no changes in her blood pressure medication. She has a history of nonalcoholic steatohepatitis. Therefore I've asked the patient to return fasting for fasting lipid panel as well as a CMP to monitor for dyslipidemia and monitor her liver function test. At that time I'll also check a TSH to monitor management of her hypothyroidism. I would also like to check a hemoglobin A1c and a urine microalbumin. If her A1c stays less than 6.5, I would like to discontinue  Amaryl due to the risk of hypoglycemia area and spent more than 30 minutes with the patient discussing her situation. Recommended marriage counseling. I believe that until they seek therapy and come to some type of understanding and forgiveness that she will harm her resentment that would contribute to her depression moving forward. I believe that she feels disrespected and a lack of love in the marriage and this is contributing to her depression.   09/08/17 Patient discontinued trintellix in November.  However she denies any depression today.  Her blood pressure slightly elevated 146/80.  She is on losartan/hydrochlorothiazide 100/12.5.  She denies any chest pain.  She does report occasional shortness of breath.  At her last visit, I obtain an echocardiogram because of her worsening murmur which confirms moderate to severe aortic stenosis.  She is scheduled to see a cardiologist later this month to discuss treatment options.  Therefore I do not want to lower preload significantly despite the elevated blood pressures because of aortic stenosis.  Patient denies syncope or near syncope or palpitations.  She is not checking her blood sugars regularly.  However she denies any hypoglycemic episodes.  She denies any polyuria, polydipsia, or blurry vision.  She denies any myalgias or right upper quadrant pain.  Recently her insurance has denied her Ambien.  She takes Ambien at night to help her sleep.  She has tried trazodone in the past without benefit.  She is also tried Belsomra in the past and this did not work as well.  Therefore she is requesting that I try to complete a prior authorization for the medication.  She is also here today to recheck her TSH.  However upon medication list review, the patient is not sure that she is actually taking levothyroxine.  At that time, my plan was: Patient has discontinued her antidepressant.  However she is doing well and I see no reason to resume medication that the patient  does not seem to require.  I would like to check a hemoglobin A1c to monitor the management of her diabetes mellitus.  If her hemoglobin A1c is significantly below 6, I would discontinue glimepiride due to the risk of hypoglycemia.  I will also monitor her liver function test regarding her history of cirrhosis secondary to [facility].  Her blood pressure slightly elevated today however I do not want to lower preload significantly because of her aortic stenosis.  I will switch her losartan HCTZ to lisinopril HCTZ 20/12.5, 2 tablets once a day due to the recent recall of losartan and difficulty finding that medication.  I will check a TSH to ensure that the patient is compliant with her levothyroxine.  Patient will go home and verify whether or not she is actually been taking the medication.  I will also check a fasting lipid  panel.  Goal LDL cholesterol is less than 100 because of the diabetes.  Regarding her aortic stenosis, she is scheduled to see the cardiologist later this month.   11/28/17 Patient feels she needs to resume her antidepressant.  She reports anhedonia, daily depression.  She feels excessive feelings of guilt.  She has crying spells.  Recently she heard her son tell his children that his parents could not care for themselves more or less watch after his kids.  This broke her heart.  She understands that she has dementia however this is only exacerbated her depression.  She also reports chronic fatigue.  She has trouble sleeping.  She has trouble concentrating.  She saw significant benefit on the antidepressant however since stopping it, the depression seems to spiral out of control.   At that time, my plan was:  Patient's depression has worsened.  Begin Effexor XR 75 mg p.o. every morning.  Increase to 150 mg in 1 week.  Reassess in 5 weeks.  Given the fact her fatigue has worsened, I will check lab work to ensure that her thyroid is adequately treated and that there has not been a decline in her  renal function, her CBC, or that her diabetes is out of control.  12/12/17 Lab on 12/05/2017  Component Date Value Ref Range Status  . MICRO NUMBER: 12/05/2017 [id number]   Final  . SPECIMEN QUALITY: 12/05/2017 ADEQUATE   Final  . Source: 12/05/2017 NOT GIVEN   Final  . STATUS: 12/05/2017 FINAL   Final  . FECAL GLOBIN RESULT: 12/05/2017 Detected*  Final   Detected  Office Visit on 11/28/2017  Component Date Value Ref Range Status  . WBC 11/28/2017 9.0  3.8 - 10.8 Thousand/uL Final  . RBC 11/28/2017 3.65* 3.80 - 5.10 Million/uL Final  . Hemoglobin 11/28/2017 9.5* 11.7 - 15.5 g/dL Final  . HCT 11/28/2017 28.6* 35.0 - 45.0 % Final  . MCV 11/28/2017 78.4* 80.0 - 100.0 fL Final  . MCH 11/28/2017 26.0* 27.0 - 33.0 pg Final  . MCHC 11/28/2017 33.2  32.0 - 36.0 g/dL Final  . RDW 11/28/2017 14.0  11.0 - 15.0 % Final  . Platelets 11/28/2017 121* 140 - 400 Thousand/uL Final  . MPV 11/28/2017 12.3  7.5 - 12.5 fL Final  . Neutro Abs 11/28/2017 5,274  1,500 - 7,800 cells/uL Final  . Lymphs Abs 11/28/2017 2,592  850 - 3,900 cells/uL Final  . WBC mixed population 11/28/2017 801  200 - 950 cells/uL Final  . Eosinophils Absolute 11/28/2017 243  15 - 500 cells/uL Final  . Basophils Absolute 11/28/2017 90  0 - 200 cells/uL Final  . Neutrophils Relative % 11/28/2017 58.6  % Final  . Total Lymphocyte 11/28/2017 28.8  % Final  . Monocytes Relative 11/28/2017 8.9  % Final  . Eosinophils Relative 11/28/2017 2.7  % Final  . Basophils Relative 11/28/2017 1.0  % Final  . Glucose, Bld 11/28/2017 146* 65 - 99 mg/dL Final   Comment: .            Fasting reference interval . For someone without known diabetes, a glucose value >125 mg/dL indicates that they Arredondo have diabetes and this should be confirmed with a follow-up test. .   . BUN 11/28/2017 10  7 - 25 mg/dL Final  . Creat 11/28/2017 0.85  0.50 - 0.99 mg/dL Final   Comment: For patients >59 years of age, the reference limit for Creatinine is  approximately 13% higher for people identified as African-American. Marland Kitchen   Marland Kitchen  GFR, Est Non African American 11/28/2017 70  > OR = 60 mL/min/1.94m Final  . GFR, Est African American 11/28/2017 81  > OR = 60 mL/min/1.777mFinal  . BUN/Creatinine Ratio 0466/44/0347OT APPLICABLE  6 - 22 (calc) Final  . Sodium 11/28/2017 143  135 - 146 mmol/L Final  . Potassium 11/28/2017 4.6  3.5 - 5.3 mmol/L Final  . Chloride 11/28/2017 108  98 - 110 mmol/L Final  . CO2 11/28/2017 27  20 - 32 mmol/L Final  . Calcium 11/28/2017 9.3  8.6 - 10.4 mg/dL Final  . Total Protein 11/28/2017 6.6  6.1 - 8.1 g/dL Final  . Albumin 11/28/2017 3.8  3.6 - 5.1 g/dL Final  . Globulin 11/28/2017 2.8  1.9 - 3.7 g/dL (calc) Final  . AG Ratio 11/28/2017 1.4  1.0 - 2.5 (calc) Final  . Total Bilirubin 11/28/2017 0.6  0.2 - 1.2 mg/dL Final  . Alkaline phosphatase (APISO) 11/28/2017 89  33 - 130 U/L Final  . AST 11/28/2017 18  10 - 35 U/L Final  . ALT 11/28/2017 8  6 - 29 U/L Final  . Hgb A1c MFr Bld 11/28/2017 6.1* <5.7 % of total Hgb Final   Comment: For someone without known diabetes, a hemoglobin  A1c value between 5.7% and 6.4% is consistent with prediabetes and should be confirmed with a  follow-up test. . For someone with known diabetes, a value <7% indicates that their diabetes is well controlled. A1c targets should be individualized based on duration of diabetes, age, comorbid conditions, and other considerations. . This assay result is consistent with an increased risk of diabetes. . Currently, no consensus exists regarding use of hemoglobin A1c for diagnosis of diabetes for children. .   . Mean Plasma Glucose 11/28/2017 128  (calc) Final  . eAG (mmol/L) 11/28/2017 7.1  (calc) Final  . TSH 11/28/2017 1.08  0.40 - 4.50 mIU/L Final  . TEST NAME: 11/28/2017 IRON, TIBC AND FERRITIN PANEL   Final  . TEST CODE: 11/28/2017 [id number]   Final  . CLIENT CONTACT: 11/28/2017 LAJonetta Osgood Final  . REPORT ALWAYS  MESSAGE SIGNATURE 11/28/2017    Final   Comment: . The laboratory testing on this patient was verbally requested or confirmed by the ordering physician or his or her authorized representative after contact with an employee of [employer] regulations require that we maintain on file written authorization for all laboratory testing.  Accordingly we are asking that the ordering physician or his or her authorized representative sign a copy of this report and promptly return it to the client service representative. . . Signature:____________________________________________________ . Please fax this signed page to [phone] return it via your [employer].   . Vitamin B-12 11/28/2017 563  200 - 1,100 pg/mL Final  . Iron 11/28/2017 39* 45 - 160 mcg/dL Final  . TIBC 11/28/2017 395  250 - 450 mcg/dL (calc) Final  . %SAT 11/28/2017 10* 11 - 50 % (calc) Final  . Ferritin 11/28/2017 9* 20 - 288 ng/mL Final   As shown above, the patient was found to have significant iron deficiency anemia likely contributing to her fatigue.  Her stools were also positive for blood.  She has been referred to GI for evaluation of her iron deficiency anemia and guaiac positive stool.  Today she is here complaining of fatigue despite taking iron.  She is complaining of a chronic cough that has been going on for many weeks.  She has  been battling a cough off and on since December.  Cough is nonproductive but does seem to be worse at night.  She is also having some right upper quadrant abdominal pain that comes and goes.  It is mild.  There are no exacerbating or alleviating factors.  She is afebrile.  She denies any melena or hematochezia.  She denies any hemoptysis.  At that time, my plan was:  Aortic valve stenosis, etiology of cardiac valve disease unspecified I believe her fatigue is multifactorial and likely related primarily to her severe iron deficiency anemia.  Patient is taking  iron.  I explained to the patient will take at least 1 month for her blood counts to build back.  I would like to repeat a CBC in 1 month on the iron to see if she is improving.  Also I am not certain why her stool is guaiac positive.  I explained to the patient that we need her to see GI for endoscopy to rule out a bleeding ulcer or other source of GI blood loss that Koelzer be explaining her right upper quadrant discomfort.  Given her cough, I would proceed with a chest x-ray however I believe her cough Froio be either due to acid reflux given its exacerbation while lying supine, or possibly due to her ACE inhibitor.  I will have the patient discontinue lisinopril HCTZ and replace it with losartan 100 mg a day.  I have also recommended that she start taking Prilosec 20 mg a day over-the-counter and see if her symptoms improve.  If right-sided abdominal pain does not improve, consider work-up of biliary tract disease/cholelithiasis.  12/19/17 The patient's cough is getting worse.  Occasionally is productive of gray sputum but usually is dry.  She reports increasing shortness of breath.  She also reports significant weight gain over the last few days despite not changing her diet.  She reports increasing dyspnea on exertion.  She reports orthopnea.  She also reports paroxysmal nocturnal dyspnea.  Oxygen saturation is down to 93% on room air exam is significant for bibasilar crackles and a loud 3/6 to 4/6 systolic ejection murmur heard best over the aortic valve.  Patient has known moderate to severe aortic stenosis.  My concern is now that her cough Nadeem be a sign of pulmonary edema despite having a normal chest x-ray last week particular given her sudden weight gain and increasing shortness of breath.  She denies any fevers.  She does occasionally have wheezing.  She adamantly denies chest pain or syncope Past Medical History:  Diagnosis Date  . Adenomatous polyp   . Anxiety   . Aortic stenosis   . Arrhythmia   .  Diabetes ([place])   . Diverticulosis   . Fatty liver   . Hyperlipidemia   . Hypertension   . NASH (nonalcoholic steatohepatitis)   . Obesity   . SOB (shortness of breath)   . Thrombocytopenia ([place])   . Thyroid disease   . Vitamin D deficiency    Past Surgical History:  Procedure Laterality Date  . oopherectomy    . OVARIAN CYST REMOVAL     Current Outpatient Medications on File Prior to Visit  Medication Sig Dispense Refill  . ACCU-CHEK AVIVA PLUS test strip     . ACCU-CHEK SOFTCLIX LANCETS lancets     . atorvastatin (LIPITOR) 20 MG tablet Take 1 tablet (20 mg total) by mouth daily. 90 tablet 3  . Blood Glucose Monitoring Suppl (ACCU-CHEK AVIVA PLUS) w/Device KIT     .  glimepiride (AMARYL) 1 MG tablet TAKE 1 TABLET EVERY DAY WITH BREAKFAST 90 tablet 1  . levothyroxine (SYNTHROID, LEVOTHROID) 75 MCG tablet Take 1 tablet (75 mcg total) by mouth daily. 90 tablet 3  . losartan (COZAAR) 100 MG tablet Take 1 tablet (100 mg total) by mouth daily. Stop lisinopril hctz 90 tablet 3  . metFORMIN (GLUCOPHAGE-XR) 500 MG 24 hr tablet TAKE 2 TABLETS TWICE DAILY 360 tablet 1  . metoprolol tartrate (LOPRESSOR) 25 MG tablet TAKE 1 TABLET TWICE DAILY 180 tablet 3  . traZODone (DESYREL) 50 MG tablet TAKE 1 TABLET AT BEDTIME AS NEEDED FOR SLEEP 90 tablet 1  . venlafaxine XR (EFFEXOR XR) 75 MG 24 hr capsule Take 1 capsule (75 mg total) by mouth daily with breakfast. 1 a day for 1 week then 2 tabs a day thereafter 60 capsule 3  . zolpidem (AMBIEN) 5 MG tablet TAKE 1 TABLET BY MOUTH EVERY DAY AT BEDTIME AS NEEDED FOR SLEEP 30 tablet 2   Current Facility-Administered Medications on File Prior to Visit  Medication Dose Route Frequency Provider Last Rate Last Dose  . cyanocobalamin ((VITAMIN B-12)) injection 1,000 mcg  1,000 mcg Subcutaneous Q30 days [name], MD   1,000 mcg at 09/08/17 1408   Allergies  Allergen Reactions  . Celecoxib   . Prednisone Itching    Heart rate increased  . Macrodantin  [Nitrofurantoin Macrocrystal] Rash  . Penicillins Hives    Per family   Social History   Socioeconomic History  . Marital status: Married    Spouse name: Not on file  . Number of children: Not on file  . Years of education: Not on file  . Highest education level: Not on file  Occupational History  . Not on file  Social Needs  . Financial resource strain: Not on file  . Food insecurity:    Worry: Not on file    Inability: Not on file  . Transportation needs:    Medical: Not on file    Non-medical: Not on file  Tobacco Use  . Smoking status: Current Every Day Smoker    Packs/day: 0.50    Types: Cigarettes  . Smokeless tobacco: Never Used  Substance and Sexual Activity  . Alcohol use: No  . Drug use: No  . Sexual activity: Not Currently  Lifestyle  . Physical activity:    Days per week: Not on file    Minutes per session: Not on file  . Stress: Not on file  Relationships  . Social connections:    Talks on phone: Not on file    Gets together: Not on file    Attends religious service: Not on file    Active member of club or organization: Not on file    Attends meetings of clubs or organizations: Not on file    Relationship status: Not on file  . Intimate partner violence:    Fear of current or ex partner: Not on file    Emotionally abused: Not on file    Physically abused: Not on file    Forced sexual activity: Not on file  Other Topics Concern  . Not on file  Social History Narrative  . Not on file   Family History  Problem Relation Age of Onset  . Lung cancer Father   . Heart disease Father        MI at age 31  . Alcohol abuse Father   . Alzheimer's disease Mother   . Dementia Mother   .  Other Mother        twisted colon   . Stomach cancer Neg Hx   . Colon cancer Neg Hx       Review of Systems  Psychiatric/Behavioral: The patient has insomnia.   All other systems reviewed and are negative.      Objective:   Physical Exam  Constitutional: She  is oriented to person, place, and time. She appears well-developed and well-nourished.  HENT:  Right Ear: External ear normal.  Left Ear: External ear normal.  Nose: Nose normal.  Mouth/Throat: Oropharynx is clear and moist. No oropharyngeal exudate.  Eyes: Pupils are equal, round, and reactive to light. Conjunctivae and EOM are normal.  Neck: Neck supple. No JVD present. No thyromegaly present.  Cardiovascular: Normal rate and regular rhythm.  Murmur heard.  Systolic murmur is present with a grade of 3/6. Pulmonary/Chest: Effort normal. No respiratory distress. She has no wheezes. She has rales.  Abdominal: Soft. Bowel sounds are normal. She exhibits no distension. There is no tenderness. There is no rebound and no guarding.  Musculoskeletal: She exhibits no edema.  Lymphadenopathy:    She has no cervical adenopathy.  Neurological: She is alert and oriented to person, place, and time. She has normal reflexes. No cranial nerve deficit. She exhibits normal muscle tone. Coordination normal.  Psychiatric: She has a normal mood and affect. Her behavior is normal. Judgment and thought content normal.  Vitals reviewed.         Assessment & Plan:  Acute pulmonary edema (HCC) - Plan: DG Chest 2 View  SOB (shortness of breath)  Weight gain  Aortic valve stenosis, etiology of cardiac valve disease unspecified  Patient's cough [place]rell in fact be multifactorial however her exam today significant for sudden weight gain, 3/6 to 4/6 systolic ejection murmur, bibasilar crackles, orthopnea, and a pulse oximetry of 93% on room air.  All these findings are concerning for acute pulmonary edema secondary to decreased systolic function or possibly her aortic stenosis.  I will send the patient immediately for a chest x-ray to rule out bilateral pneumonia.  If chest x-ray confirms pulmonary edema, I will start the patient on Lasix 40 mg twice daily and K-Dur 20 mEq p.o. daily and recheck the patient on Monday  with express instructions to go to the hospital if getting worse.  Obviously of chest x-ray shows pneumonia, this plan will change in the patient will likely be placed on Levaquin.

## 2017-12-22 ENCOUNTER — Ambulatory Visit: Payer: Medicare HMO | Admitting: Family Medicine

## 2017-12-23 ENCOUNTER — Ambulatory Visit (INDEPENDENT_AMBULATORY_CARE_PROVIDER_SITE_OTHER): Payer: Medicare HMO | Admitting: Family Medicine

## 2017-12-23 ENCOUNTER — Encounter: Payer: Self-pay | Admitting: Family Medicine

## 2017-12-23 ENCOUNTER — Other Ambulatory Visit: Payer: Self-pay

## 2017-12-23 VITALS — BP 110/64 | HR 69 | Temp 98.1°F | Ht 64.5 in | Wt 207.0 lb

## 2017-12-23 DIAGNOSIS — R05 Cough: Secondary | ICD-10-CM

## 2017-12-23 DIAGNOSIS — R0602 Shortness of breath: Secondary | ICD-10-CM

## 2017-12-23 DIAGNOSIS — J810 Acute pulmonary edema: Secondary | ICD-10-CM | POA: Diagnosis not present

## 2017-12-23 DIAGNOSIS — D509 Iron deficiency anemia, unspecified: Secondary | ICD-10-CM | POA: Diagnosis not present

## 2017-12-23 DIAGNOSIS — I350 Nonrheumatic aortic (valve) stenosis: Secondary | ICD-10-CM

## 2017-12-23 DIAGNOSIS — R609 Edema, unspecified: Secondary | ICD-10-CM | POA: Diagnosis not present

## 2017-12-23 DIAGNOSIS — R059 Cough, unspecified: Secondary | ICD-10-CM

## 2017-12-23 LAB — CBC WITH DIFFERENTIAL/PLATELET
Basophils Absolute: 113 cells/uL (ref 0–200)
Basophils Relative: 1 %
EOS ABS: 305 {cells}/uL (ref 15–500)
Eosinophils Relative: 2.7 %
HEMATOCRIT: 30.3 % — AB (ref 35.0–45.0)
Hemoglobin: 9.7 g/dL — ABNORMAL LOW (ref 11.7–15.5)
Lymphs Abs: 2870 cells/uL (ref 850–3900)
MCH: 24.2 pg — AB (ref 27.0–33.0)
MCHC: 32 g/dL (ref 32.0–36.0)
MCV: 75.6 fL — ABNORMAL LOW (ref 80.0–100.0)
MONOS PCT: 9.6 %
MPV: 11.2 fL (ref 7.5–12.5)
NEUTROS PCT: 61.3 %
Neutro Abs: 6927 cells/uL (ref 1500–7800)
PLATELETS: 152 10*3/uL (ref 140–400)
RBC: 4.01 10*6/uL (ref 3.80–5.10)
RDW: 14.7 % (ref 11.0–15.0)
TOTAL LYMPHOCYTE: 25.4 %
WBC mixed population: 1085 cells/uL — ABNORMAL HIGH (ref 200–950)
WBC: 11.3 10*3/uL — ABNORMAL HIGH (ref 3.8–10.8)

## 2017-12-23 LAB — BASIC METABOLIC PANEL WITH GFR
BUN: 14 mg/dL (ref 7–25)
CHLORIDE: 102 mmol/L (ref 98–110)
CO2: 28 mmol/L (ref 20–32)
Calcium: 9.4 mg/dL (ref 8.6–10.4)
Creat: 0.95 mg/dL (ref 0.50–0.99)
GFR, EST AFRICAN AMERICAN: 71 mL/min/{1.73_m2} (ref >=60)
GFR, EST NON AFRICAN AMERICAN: 61 mL/min/{1.73_m2} (ref >=60)
Glucose, Bld: 147 mg/dL — ABNORMAL HIGH (ref 65–99)
POTASSIUM: 3.6 mmol/L (ref 3.5–5.3)
Sodium: 139 mmol/L (ref 135–146)

## 2017-12-23 NOTE — Progress Notes (Signed)
Subjective:    Patient ID: Kelly [name], female    DOB: January 24, 1951, 67 y.o.   MRN: [id number]  Insomnia     10/2016 Patient is a new patient here today with her son to establish care. Apparently she was recently admitted to the hospital with altered mental status. She was diagnosed with benzodiazepine intoxication and underlying dementia. She is here today for follow-up. She also apparently had low B12 levels which was confirmed in the hospital and she's been taking B12 tablets. Patient states that she is always been extremely anxious to stay at home alone. Recently her husband has been in a rehabilitation facility after surgery. Therefore she was taking Xanax prescribed by her previous primary care provider in addition to Ambien which she will take at night to help her sleep. Apparently she has been on Ambien for many many years due to chronic insomnia. Her son was unable to get her to answer the door one morning and broke in the back door to find his mother extremely intoxicated and confused. She was taken to the hospital. Their CAT scan showed mild diffuse cortical thickening. Ammonia levels were normal. RPR was normal. TSH was normal. Hemoglobin A1c was 5.4 PA B12 level was low at less than 200. Otherwise workup was unremarkable aside from some thrombocytopenia which is chronic and a mild anemia presumed to be secondary to B12 deficiency and iron deficiency. Since discharge from the hospital she is not taking any further Xanax. Her son has also not allowed her to have Ambien over the last month. She is here today for evaluation. I performed a Mini-Mental status exam. The patient is easily able to score is 5 out of 5. She also easily tells me the location. She is able to remember 3 objects easily. She can spell world in reverse and perform serial sevens. However the patient does have a noticeable word finding difficulty just in general communication. She has a difficult time remembering the names of  objects and remembering what she's trying to tell me. On clock drawing assessment, the patient is able to correctly draw the clock and the time. Takes for a prolonged period of time to do this. I then performed a [place] cognitive assessment and the patient had an extremely difficult time performing the pattern test alternating between letters and numbers in order. Even after I explained to the patient she has a difficult time drawing the areas appropriately. Therefore it appears that she has some mild dementia with some mild anomia.  Patient is requesting a refill on her Ambien.  She also has a history of lymphocytic colitis previously managed by Dr. [name]. She has not seen him in quite some time and she is not taking that the desonide that he had previously prescribed. As a result she has frequent diarrhea which could be contributing to her B12 deficiency the fact of malabsorptive diarrhea. She also has underlying history of nonalcoholic steatohepatitis complicated by thrombocytopenia. Platelet counts in the hospital between 70 and 80. She has a mild anemia with hemoglobin between 11 and 12. Her diabetes thankfully was well controlled at 5.4 and her TSH was within normal limits.  At that time, my plan was: The patient's memory loss was likely multifactorial. Given the cortical thickening on CT scan and some of the findings demonstrated today on her cognitive assessment she appears to have a very mild dementia. It is difficult for me to gauge this given the fact I am just meeting the patient today. I  believe this is likely exacerbated by the combination of benzodiazepines which she Stenberg have taken several and her Ambien. This could also be affected by her B12 deficiency. Therefore I recommended that we treat the patient's B12 deficiency with B12 injections parenterally 1000 g every month as I'm concerned that she does not absorb B12 well to the gastrointestinal tract due to her lymphocytic colitis. I've also  recommended that she start iron replacement. I spent more than 45 minutes with this patient discussing her diagnosis and evaluating her. I strongly recommended that we keep her away from any medication that can exacerbate her memory particularly benzodiazepines and Ambien. I believe a lot of her problems are exacerbated by anxiety. Therefore I recommended neuropsychological consultation to determine medication options. Perhaps trazodone would be a better option for this patient rather than Ambien. I would like to see her back in one month to discuss and follow-up. I refilled her other medications. No lab work was obtained at this visit but I reviewed the battery of lab work that was obtained at her most recent hospitalization:  11/07/16 Patient is under tremendous stress. Her husband is battling congestive heart failure and recently underwent an amputation. She states that he is constantly unloading his burdens and complaints onto her. However he is in denial about her medical problems. She feels very little support. She continues to battle anxiety and insomnia. She reports poor concentration, depression, anhedonia. She has very little energy. In the past she is tried and failed Zoloft, Wellbutrin, Prozac," several others". She denies any suicidal ideation. She denies any hallucinations or delusions. She is wanting to try Xanax again to help her sleep. Please see the previous office visit we discussed the problems of Xanax created for this patient.  AT that time, my plan was: I spent more than 25 minutes with this patient discussing her problems. I do believe she has mild dementia but also believe she could be suffering from depression. I would like to try the patient on Trintellix 5 mg a day for 1 week and gradually increased to 10 mg a day. Recheck in one month to see if there is benefit. Try to avoid high-risk medications such as benzodiazepines. She can continue use the trazodone as needed for  sleep.  03/14/17 Patient is here today for follow-up. I had asked the patient to return to recheck a fasting lipid panel, recheck her TSH, recheck her hemoglobin A1c to monitor the management of her diabetes, hypothyroidism, and to monitor for evidence of hyperlipidemia. However the patient upon arrival tells me that her depression is worse. Spent more than 30 minutes with the patient in discussion. He does not include her in any financial decisions. He does not seem to respect her take into consideration her desires or her plans.  He also apparently had an affair for many years and has never asked for forgiveness. They have never discussed this normal had any kind of resolution regarding this. I believe that her stressors in her marriage are contributing to her depression. From the standpoint of her dementia, she has very mild memory loss. Just talking to the patient, is difficult to notice. However she stopped coming for her B12 injections and I believe is due to the fact that she forgot them. She states that she didn't and that she was confused on how often she needed to come. I recommended that she come once a month moving forward for B12 injections. Her blood pressure today is adequately controlled.  She denies  any hypoglycemia. She denies any polyuria, polydipsia, or blurred vision. She denies any tachycardia or palpitations. She denies any substantial changes in her weight.  At that time, my plan was: Blood pressure today is well controlled. I'll make no changes in her blood pressure medication. She has a history of nonalcoholic steatohepatitis. Therefore I've asked the patient to return fasting for fasting lipid panel as well as a CMP to monitor for dyslipidemia and monitor her liver function test. At that time I'll also check a TSH to monitor management of her hypothyroidism. I would also like to check a hemoglobin A1c and a urine microalbumin. If her A1c stays less than 6.5, I would like to discontinue  Amaryl due to the risk of hypoglycemia area and spent more than 30 minutes with the patient discussing her situation. Recommended marriage counseling. I believe that until they seek therapy and come to some type of understanding and forgiveness that she will harm her resentment that would contribute to her depression moving forward. I believe that she feels disrespected and a lack of love in the marriage and this is contributing to her depression.   09/08/17 Patient discontinued trintellix in November.  However she denies any depression today.  Her blood pressure slightly elevated 146/80.  She is on losartan/hydrochlorothiazide 100/12.5.  She denies any chest pain.  She does report occasional shortness of breath.  At her last visit, I obtain an echocardiogram because of her worsening murmur which confirms moderate to severe aortic stenosis.  She is scheduled to see a cardiologist later this month to discuss treatment options.  Therefore I do not want to lower preload significantly despite the elevated blood pressures because of aortic stenosis.  Patient denies syncope or near syncope or palpitations.  She is not checking her blood sugars regularly.  However she denies any hypoglycemic episodes.  She denies any polyuria, polydipsia, or blurry vision.  She denies any myalgias or right upper quadrant pain.  Recently her insurance has denied her Ambien.  She takes Ambien at night to help her sleep.  She has tried trazodone in the past without benefit.  She is also tried Belsomra in the past and this did not work as well.  Therefore she is requesting that I try to complete a prior authorization for the medication.  She is also here today to recheck her TSH.  However upon medication list review, the patient is not sure that she is actually taking levothyroxine.  At that time, my plan was: Patient has discontinued her antidepressant.  However she is doing well and I see no reason to resume medication that the patient  does not seem to require.  I would like to check a hemoglobin A1c to monitor the management of her diabetes mellitus.  If her hemoglobin A1c is significantly below 6, I would discontinue glimepiride due to the risk of hypoglycemia.  I will also monitor her liver function test regarding her history of cirrhosis secondary to [facility].  Her blood pressure slightly elevated today however I do not want to lower preload significantly because of her aortic stenosis.  I will switch her losartan HCTZ to lisinopril HCTZ 20/12.5, 2 tablets once a day due to the recent recall of losartan and difficulty finding that medication.  I will check a TSH to ensure that the patient is compliant with her levothyroxine.  Patient will go home and verify whether or not she is actually been taking the medication.  I will also check a fasting lipid  panel.  Goal LDL cholesterol is less than 100 because of the diabetes.  Regarding her aortic stenosis, she is scheduled to see the cardiologist later this month.   11/28/17 Patient feels she needs to resume her antidepressant.  She reports anhedonia, daily depression.  She feels excessive feelings of guilt.  She has crying spells.  Recently she heard her son tell his children that his parents could not care for themselves more or less watch after his kids.  This broke her heart.  She understands that she has dementia however this is only exacerbated her depression.  She also reports chronic fatigue.  She has trouble sleeping.  She has trouble concentrating.  She saw significant benefit on the antidepressant however since stopping it, the depression seems to spiral out of control.   At that time, my plan was:  Patient's depression has worsened.  Begin Effexor XR 75 mg p.o. every morning.  Increase to 150 mg in 1 week.  Reassess in 5 weeks.  Given the fact her fatigue has worsened, I will check lab work to ensure that her thyroid is adequately treated and that there has not been a decline in her  renal function, her CBC, or that her diabetes is out of control.  12/12/17 Lab on 12/05/2017  Component Date Value Ref Range Status  . MICRO NUMBER: 12/05/2017 [id number]   Final  . SPECIMEN QUALITY: 12/05/2017 ADEQUATE   Final  . Source: 12/05/2017 NOT GIVEN   Final  . STATUS: 12/05/2017 FINAL   Final  . FECAL GLOBIN RESULT: 12/05/2017 Detected*  Final   Detected  Office Visit on 11/28/2017  Component Date Value Ref Range Status  . WBC 11/28/2017 9.0  3.8 - 10.8 Thousand/uL Final  . RBC 11/28/2017 3.65* 3.80 - 5.10 Million/uL Final  . Hemoglobin 11/28/2017 9.5* 11.7 - 15.5 g/dL Final  . HCT 11/28/2017 28.6* 35.0 - 45.0 % Final  . MCV 11/28/2017 78.4* 80.0 - 100.0 fL Final  . MCH 11/28/2017 26.0* 27.0 - 33.0 pg Final  . MCHC 11/28/2017 33.2  32.0 - 36.0 g/dL Final  . RDW 11/28/2017 14.0  11.0 - 15.0 % Final  . Platelets 11/28/2017 121* 140 - 400 Thousand/uL Final  . MPV 11/28/2017 12.3  7.5 - 12.5 fL Final  . Neutro Abs 11/28/2017 5,274  1,500 - 7,800 cells/uL Final  . Lymphs Abs 11/28/2017 2,592  850 - 3,900 cells/uL Final  . WBC mixed population 11/28/2017 801  200 - 950 cells/uL Final  . Eosinophils Absolute 11/28/2017 243  15 - 500 cells/uL Final  . Basophils Absolute 11/28/2017 90  0 - 200 cells/uL Final  . Neutrophils Relative % 11/28/2017 58.6  % Final  . Total Lymphocyte 11/28/2017 28.8  % Final  . Monocytes Relative 11/28/2017 8.9  % Final  . Eosinophils Relative 11/28/2017 2.7  % Final  . Basophils Relative 11/28/2017 1.0  % Final  . Glucose, Bld 11/28/2017 146* 65 - 99 mg/dL Final   Comment: .            Fasting reference interval . For someone without known diabetes, a glucose value >125 mg/dL indicates that they Arredondo have diabetes and this should be confirmed with a follow-up test. .   . BUN 11/28/2017 10  7 - 25 mg/dL Final  . Creat 11/28/2017 0.85  0.50 - 0.99 mg/dL Final   Comment: For patients >59 years of age, the reference limit for Creatinine is  approximately 13% higher for people identified as African-American. Marland Kitchen   Marland Kitchen  GFR, Est Non African American 11/28/2017 70  > OR = 60 mL/min/1.94m Final  . GFR, Est African American 11/28/2017 81  > OR = 60 mL/min/1.777mFinal  . BUN/Creatinine Ratio 0466/44/0347OT APPLICABLE  6 - 22 (calc) Final  . Sodium 11/28/2017 143  135 - 146 mmol/L Final  . Potassium 11/28/2017 4.6  3.5 - 5.3 mmol/L Final  . Chloride 11/28/2017 108  98 - 110 mmol/L Final  . CO2 11/28/2017 27  20 - 32 mmol/L Final  . Calcium 11/28/2017 9.3  8.6 - 10.4 mg/dL Final  . Total Protein 11/28/2017 6.6  6.1 - 8.1 g/dL Final  . Albumin 11/28/2017 3.8  3.6 - 5.1 g/dL Final  . Globulin 11/28/2017 2.8  1.9 - 3.7 g/dL (calc) Final  . AG Ratio 11/28/2017 1.4  1.0 - 2.5 (calc) Final  . Total Bilirubin 11/28/2017 0.6  0.2 - 1.2 mg/dL Final  . Alkaline phosphatase (APISO) 11/28/2017 89  33 - 130 U/L Final  . AST 11/28/2017 18  10 - 35 U/L Final  . ALT 11/28/2017 8  6 - 29 U/L Final  . Hgb A1c MFr Bld 11/28/2017 6.1* <5.7 % of total Hgb Final   Comment: For someone without known diabetes, a hemoglobin  A1c value between 5.7% and 6.4% is consistent with prediabetes and should be confirmed with a  follow-up test. . For someone with known diabetes, a value <7% indicates that their diabetes is well controlled. A1c targets should be individualized based on duration of diabetes, age, comorbid conditions, and other considerations. . This assay result is consistent with an increased risk of diabetes. . Currently, no consensus exists regarding use of hemoglobin A1c for diagnosis of diabetes for children. .   . Mean Plasma Glucose 11/28/2017 128  (calc) Final  . eAG (mmol/L) 11/28/2017 7.1  (calc) Final  . TSH 11/28/2017 1.08  0.40 - 4.50 mIU/L Final  . TEST NAME: 11/28/2017 IRON, TIBC AND FERRITIN PANEL   Final  . TEST CODE: 11/28/2017 [id number]   Final  . CLIENT CONTACT: 11/28/2017 LAJonetta Osgood Final  . REPORT ALWAYS  MESSAGE SIGNATURE 11/28/2017    Final   Comment: . The laboratory testing on this patient was verbally requested or confirmed by the ordering physician or his or her authorized representative after contact with an employee of [employer] regulations require that we maintain on file written authorization for all laboratory testing.  Accordingly we are asking that the ordering physician or his or her authorized representative sign a copy of this report and promptly return it to the client service representative. . . Signature:____________________________________________________ . Please fax this signed page to [phone] return it via your [employer].   . Vitamin B-12 11/28/2017 563  200 - 1,100 pg/mL Final  . Iron 11/28/2017 39* 45 - 160 mcg/dL Final  . TIBC 11/28/2017 395  250 - 450 mcg/dL (calc) Final  . %SAT 11/28/2017 10* 11 - 50 % (calc) Final  . Ferritin 11/28/2017 9* 20 - 288 ng/mL Final   As shown above, the patient was found to have significant iron deficiency anemia likely contributing to her fatigue.  Her stools were also positive for blood.  She has been referred to GI for evaluation of her iron deficiency anemia and guaiac positive stool.  Today she is here complaining of fatigue despite taking iron.  She is complaining of a chronic cough that has been going on for many weeks.  She has  been battling a cough off and on since December.  Cough is nonproductive but does seem to be worse at night.  She is also having some right upper quadrant abdominal pain that comes and goes.  It is mild.  There are no exacerbating or alleviating factors.  She is afebrile.  She denies any melena or hematochezia.  She denies any hemoptysis.  At that time, my plan was:  Aortic valve stenosis, etiology of cardiac valve disease unspecified I believe her fatigue is multifactorial and likely related primarily to her severe iron deficiency anemia.  Patient is taking  iron.  I explained to the patient will take at least 1 month for her blood counts to build back.  I would like to repeat a CBC in 1 month on the iron to see if she is improving.  Also I am not certain why her stool is guaiac positive.  I explained to the patient that we need her to see GI for endoscopy to rule out a bleeding ulcer or other source of GI blood loss that Kuipers be explaining her right upper quadrant discomfort.  Given her cough, I would proceed with a chest x-ray however I believe her cough Shilling be either due to acid reflux given its exacerbation while lying supine, or possibly due to her ACE inhibitor.  I will have the patient discontinue lisinopril HCTZ and replace it with losartan 100 mg a day.  I have also recommended that she start taking Prilosec 20 mg a day over-the-counter and see if her symptoms improve.  If right-sided abdominal pain does not improve, consider work-up of biliary tract disease/cholelithiasis.  12/19/17 The patient's cough is getting worse.  Occasionally is productive of gray sputum but usually is dry.  She reports increasing shortness of breath.  She also reports significant weight gain over the last few days despite not changing her diet.  She reports increasing dyspnea on exertion.  She reports orthopnea.  She also reports paroxysmal nocturnal dyspnea.  Oxygen saturation is down to 93% on room air exam is significant for bibasilar crackles and a loud 3/6 to 4/6 systolic ejection murmur heard best over the aortic valve.  Patient has known moderate to severe aortic stenosis.  My concern is now that her cough [place] be a sign of pulmonary edema despite having a normal chest x-ray last week particular given her sudden weight gain and increasing shortness of breath.  She denies any fevers.  She does occasionally have wheezing.  She adamantly denies chest pain or syncope.  At that time, my plan was: Patient's cough Grieves in fact be multifactorial however her exam today significant for  sudden weight gain, 3/6 to 4/6 systolic ejection murmur, bibasilar crackles, orthopnea, and a pulse oximetry of 93% on room air.  All these findings are concerning for acute pulmonary edema secondary to decreased systolic function or possibly her aortic stenosis.  I will send the patient immediately for a chest x-ray to rule out bilateral pneumonia.  If chest x-ray confirms pulmonary edema, I will start the patient on Lasix 40 mg twice daily and K-Dur 20 mEq p.o. daily and recheck the patient on Monday with express instructions to go to the hospital if getting worse.  Obviously of chest x-ray shows pneumonia, this plan will change in the patient will likely be placed on Levaquin.  12/23/17 Chest x-ray revealed no significant abnormalities aside from cardiomegaly which is my unofficial read.  Patient used Lasix as prescribed over the weekend 40 mg  twice daily.  Her breathing has improved dramatically.  She has diuresed almost 12 pounds!  She denies any lightheadedness.  She denies any dry mouth.  She denies any dizziness however I am concerned about dehydration given the significant weight loss.  Her pulse oximetry has improved dramatically and she states that her breathing is back to her baseline.  The nagging cough that she experienced is also improving however I am not convinced that that is due to pulmonary edema but rather could be due to the ACE inhibitor that we recently stopped.  She is due also to recheck her anemia that was discovered in April.  She has been taking iron now for 1 month.  Hemoglobin was 12 in February.  It dropped to 9.5 on April 26.  She has been on iron ever since that time.  She is due to recheck it today.  She has an appointment to see gastroenterology as her FIT test on [place] 3 was positive for blood. Wt Readings from Last 3 Encounters:  12/23/17 207 lb (93.9 kg)  12/19/17 219 lb (99.3 kg)  12/12/17 213 lb (96.6 kg)    Past Medical History:  Diagnosis Date  . Adenomatous polyp    . Anxiety   . Aortic stenosis   . Arrhythmia   . Diabetes ([place])   . Diverticulosis   . Fatty liver   . Hyperlipidemia   . Hypertension   . NASH (nonalcoholic steatohepatitis)   . Obesity   . SOB (shortness of breath)   . Thrombocytopenia ([place])   . Thyroid disease   . Vitamin D deficiency    Past Surgical History:  Procedure Laterality Date  . oopherectomy    . OVARIAN CYST REMOVAL     Current Outpatient Medications on File Prior to Visit  Medication Sig Dispense Refill  . ACCU-CHEK AVIVA PLUS test strip     . ACCU-CHEK SOFTCLIX LANCETS lancets     . atorvastatin (LIPITOR) 20 MG tablet Take 1 tablet (20 mg total) by mouth daily. 90 tablet 3  . Blood Glucose Monitoring Suppl (ACCU-CHEK AVIVA PLUS) w/Device KIT     . furosemide (LASIX) 40 MG tablet Take 1 tablet (40 mg total) by mouth 2 (two) times daily. 60 tablet 3  . glimepiride (AMARYL) 1 MG tablet TAKE 1 TABLET EVERY DAY WITH BREAKFAST 90 tablet 1  . levothyroxine (SYNTHROID, LEVOTHROID) 75 MCG tablet Take 1 tablet (75 mcg total) by mouth daily. 90 tablet 3  . losartan (COZAAR) 100 MG tablet Take 1 tablet (100 mg total) by mouth daily. Stop lisinopril hctz 90 tablet 3  . metFORMIN (GLUCOPHAGE-XR) 500 MG 24 hr tablet TAKE 2 TABLETS TWICE DAILY 360 tablet 1  . metoprolol tartrate (LOPRESSOR) 25 MG tablet TAKE 1 TABLET TWICE DAILY 180 tablet 3  . potassium chloride SA (K-DUR,KLOR-CON) 20 MEQ tablet Take 1 tablet (20 mEq total) by mouth daily. 30 tablet 0  . traZODone (DESYREL) 50 MG tablet TAKE 1 TABLET AT BEDTIME AS NEEDED FOR SLEEP 90 tablet 1  . venlafaxine XR (EFFEXOR XR) 75 MG 24 hr capsule Take 1 capsule (75 mg total) by mouth daily with breakfast. 1 a day for 1 week then 2 tabs a day thereafter 60 capsule 3  . zolpidem (AMBIEN) 5 MG tablet TAKE 1 TABLET BY MOUTH EVERY DAY AT BEDTIME AS NEEDED FOR SLEEP 30 tablet 2   Current Facility-Administered Medications on File Prior to Visit  Medication Dose Route Frequency Provider  Last Rate Last Dose  .  cyanocobalamin ((VITAMIN B-12)) injection 1,000 mcg  1,000 mcg Subcutaneous Q30 days [name], MD   1,000 mcg at 09/08/17 1408   Allergies  Allergen Reactions  . Celecoxib   . Prednisone Itching    Heart rate increased  . Macrodantin [Nitrofurantoin Macrocrystal] Rash  . Penicillins Hives    Per family   Social History   Socioeconomic History  . Marital status: Married    Spouse name: Not on file  . Number of children: Not on file  . Years of education: Not on file  . Highest education level: Not on file  Occupational History  . Not on file  Social Needs  . Financial resource strain: Not on file  . Food insecurity:    Worry: Not on file    Inability: Not on file  . Transportation needs:    Medical: Not on file    Non-medical: Not on file  Tobacco Use  . Smoking status: Current Every Day Smoker    Packs/day: 0.50    Types: Cigarettes  . Smokeless tobacco: Never Used  Substance and Sexual Activity  . Alcohol use: No  . Drug use: No  . Sexual activity: Not Currently  Lifestyle  . Physical activity:    Days per week: Not on file    Minutes per session: Not on file  . Stress: Not on file  Relationships  . Social connections:    Talks on phone: Not on file    Gets together: Not on file    Attends religious service: Not on file    Active member of club or organization: Not on file    Attends meetings of clubs or organizations: Not on file    Relationship status: Not on file  . Intimate partner violence:    Fear of current or ex partner: Not on file    Emotionally abused: Not on file    Physically abused: Not on file    Forced sexual activity: Not on file  Other Topics Concern  . Not on file  Social History Narrative  . Not on file   Family History  Problem Relation Age of Onset  . Lung cancer Father   . Heart disease Father        MI at age 45  . Alcohol abuse Father   . Alzheimer's disease Mother   . Dementia Mother   .  Other Mother        twisted colon   . Stomach cancer Neg Hx   . Colon cancer Neg Hx       Review of Systems  Psychiatric/Behavioral: The patient has insomnia.   All other systems reviewed and are negative.      Objective:   Physical Exam  Constitutional: She is oriented to person, place, and time. She appears well-developed and well-nourished.  HENT:  Right Ear: External ear normal.  Left Ear: External ear normal.  Nose: Nose normal.  Mouth/Throat: Oropharynx is clear and moist. No oropharyngeal exudate.  Eyes: Pupils are equal, round, and reactive to light. Conjunctivae and EOM are normal.  Neck: Neck supple. No JVD present. No thyromegaly present.  Cardiovascular: Normal rate and regular rhythm.  Murmur heard.  Systolic murmur is present with a grade of 3/6. Pulmonary/Chest: Effort normal. No respiratory distress. She has no wheezes. She has no rales.  Abdominal: Soft. Bowel sounds are normal. She exhibits no distension. There is no tenderness. There is no rebound and no guarding.  Musculoskeletal: She exhibits no  edema.  Lymphadenopathy:    She has no cervical adenopathy.  Neurological: She is alert and oriented to person, place, and time. She has normal reflexes. No cranial nerve deficit. She exhibits normal muscle tone. Coordination normal.  Psychiatric: She has a normal mood and affect. Her behavior is normal. Judgment and thought content normal.  Vitals reviewed.         Assessment & Plan:  Acute pulmonary edema (HCC) - Plan: CBC with Differential/Platelet, BASIC METABOLIC PANEL WITH GFR, Brain natriuretic peptide  SOB (shortness of breath)  Aortic valve stenosis, etiology of cardiac valve disease unspecified  Cough  Iron deficiency anemia, unspecified iron deficiency anemia type  Clinically her breathing is much better.  She has diuresed 12 pounds over the weekend.  Obviously I am now concerned about dehydration and prerenal azotemia as this was more than I  was anticipating.  I have recommended decreasing Lasix from 40 mg twice daily to 20 mg once daily and discontinuing potassium altogether.  I will recheck a BMP today to monitor electrolytes and renal function test.  I will also repeat a CBC to evaluate for improvement of her anemia on iron replacement after 1 month.  She has an appointment to see gastroenterology given her positive stool test for blood to evaluate for GI sources of blood loss.  Given her recent episode of what appears to be pulmonary edema causing shortness of breath, I will see if I can expedite her appointment to see her cardiologist as I am concerned that her pulmonary edema Damore be due to to her worsening aortic stenosis.  Her cough is also improving.  I believe the ACE inhibitor was playing a role in this.  Continue losartan and discontinue ACE inhibitor permanently

## 2017-12-24 LAB — BRAIN NATRIURETIC PEPTIDE: BRAIN NATRIURETIC PEPTIDE: 35 pg/mL (ref <100)

## 2017-12-25 ENCOUNTER — Telehealth: Payer: Self-pay | Admitting: Family Medicine

## 2017-12-25 NOTE — Telephone Encounter (Signed)
Please see labs for further information.

## 2017-12-25 NOTE — Telephone Encounter (Signed)
Patient calling back regarding lab results  [phone]

## 2017-12-31 ENCOUNTER — Encounter: Payer: Self-pay | Admitting: Physician Assistant

## 2017-12-31 ENCOUNTER — Ambulatory Visit: Payer: Medicare HMO | Admitting: Physician Assistant

## 2017-12-31 VITALS — BP 154/72 | HR 63 | Ht 64.5 in | Wt 212.0 lb

## 2017-12-31 DIAGNOSIS — D509 Iron deficiency anemia, unspecified: Secondary | ICD-10-CM | POA: Diagnosis not present

## 2017-12-31 DIAGNOSIS — K7581 Nonalcoholic steatohepatitis (NASH): Secondary | ICD-10-CM

## 2017-12-31 DIAGNOSIS — K7460 Unspecified cirrhosis of liver: Secondary | ICD-10-CM | POA: Diagnosis not present

## 2017-12-31 NOTE — Progress Notes (Signed)
I agree with the above note, plan 

## 2017-12-31 NOTE — Patient Instructions (Signed)
Increase the iron supplement to 3 times daily. You have been scheduled for an abdominal ultrasound at [facility] (1st floor of hospital) on Monday 01-05-2018 at 9:00 am. Please arrive 15 minutes prior to your appointment for registration. Make certain not to have anything to eat or drink 6 hours prior to your appointment. Should you need to reschedule your appointment, please contact radiology at [phone]. This test typically takes about 30 minutes to perform.  We made you a follow up appointment with Dr. Owens Loffler, 03-10-2018 at 3:30 PM.   If you are age 54 or older, your body mass index should be between 23-30. Your Body mass index is 35.83 kg/m. If this is out of the aforementioned range listed, please consider follow up with your Primary Care Provider.

## 2017-12-31 NOTE — Progress Notes (Signed)
 Chief Complaint: Anemia  Review of gastrointestinal problems:  1. small tubular adenoma in colon, removed September, 2009. Next colonoscopy September 2014  2. left-sided diverticulosis with diverticular associated edema noted by colonoscopy 2009  3. abnormal liver tests noted July, 2010: Likely from NASH Blood work August, 2010: Hepatitis A., B., C. were all negative; alpha one antitrypsin level normal, ceruloplasmin level normal, AMA negative, AMA negative, iron studies normal, anti-smooth muscle antibody negative, AST and ALT in 50-70 range; TTG negative, IgA level normal. Liver ultrasound suggested fatty liver disease. 4. Change in bowels, minor bleeding;  Colonoscopy [facility] 2014: The colonic mucosa was slightly erythematous throughout. Biopsies were taken randomly and sent to pathology. There were multiple diverticulum in the left colon. The terminal ileum was normal. No polyps or cancers. Pathology suggested lymphocytic colitis. Started on budesonide 3 pills daily. 5. 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 Jacobs and who was referred to me by Pickard, Warren T, MD for a complaint of nemia .      12/18/16 OV with me to establish care for her cirrhosis.also discussed diarrhea which is thought possibly due to lymphocytic colitis the past. Patient had been doing well on Imodium. Patient was told she needed HCC screening in August 2018 and to continue her Imodium.    12/23/17 office visit PCP discussed pulmonary edema causing shortness of breath, at that time was going to get an expedited appointment to see her cardiologist due to concern that this Gul[place] be due to her worsening aortic stenosis.labs including a CBC showed hemoglobin low at 9.7, previously 9.5 the month ago at which time iron  supplementation was implemented (12 3 months ago). White count also elevated at 11.3.BMP normal. BNP normal. Patient had fecal globin by immunochemistry test 12/05/17 which was positive. Also iron studies were/26/19 showing iron low at 39, percent saturation low at 10 and ferritin low at 9.    Today, presents with husband who assists with history, explains she has been on iron 325 mg per day over the past month and was told her hemoglobin did not increase. Patient is more worried regarding her current heart work up for worsening aortic stenosis. She has been scheduled for an echocardiogram in the end of June and was told recently by her cardiologist that she [place] need a catheterization. Patient describes some fatigue and increased shortness of breath lately but overall as far as her GI system goes she has not noticed any changes.   Denies fever, chills, seen bright red blood or black tarry stools,nausea, vomiting, heartburn, reflux, abdominal pain or change in bowel habits.  Past Medical History:  Diagnosis Date  . Adenomatous polyp   . Anxiety   . Aortic stenosis   . Arrhythmia   . Diabetes (HCC)   . Diverticulosis   . Fatty liver   . Hyperlipidemia   . Hypertension   . NASH (nonalcoholic steatohepatitis)   . Obesity   . SOB (shortness of breath)   . Thrombocytopenia (HCC)   . Thyroid disease   . Vitamin D deficiency     Past Surgical History:  Procedure Laterality Date  . oopherectomy    . OVARIAN CYST REMOVAL      Current Outpatient Medications  Medication Sig Dispense Refill  . ACCU-CHEK AVIVA PLUS test strip     .   ACCU-CHEK SOFTCLIX LANCETS lancets     . atorvastatin (LIPITOR) 20 MG tablet Take 1 tablet (20 mg total) by mouth daily. 90 tablet 3  . Blood Glucose Monitoring Suppl (ACCU-CHEK AVIVA PLUS) w/Device KIT     . furosemide (LASIX) 40 MG tablet Take 1 tablet (40 mg total) by mouth 2 (two) times daily. 60 tablet 3  . glimepiride (AMARYL) 1 MG tablet TAKE 1 TABLET EVERY DAY  WITH BREAKFAST 90 tablet 1  . levothyroxine (SYNTHROID, LEVOTHROID) 75 MCG tablet Take 1 tablet (75 mcg total) by mouth daily. 90 tablet 3  . losartan (COZAAR) 100 MG tablet Take 1 tablet (100 mg total) by mouth daily. Stop lisinopril hctz 90 tablet 3  . metFORMIN (GLUCOPHAGE-XR) 500 MG 24 hr tablet TAKE 2 TABLETS TWICE DAILY 360 tablet 1  . metoprolol tartrate (LOPRESSOR) 25 MG tablet TAKE 1 TABLET TWICE DAILY 180 tablet 3  . potassium chloride SA (K-DUR,KLOR-CON) 20 MEQ tablet Take 1 tablet (20 mEq total) by mouth daily. 30 tablet 0  . traZODone (DESYREL) 50 MG tablet TAKE 1 TABLET AT BEDTIME AS NEEDED FOR SLEEP 90 tablet 1  . venlafaxine XR (EFFEXOR XR) 75 MG 24 hr capsule Take 1 capsule (75 mg total) by mouth daily with breakfast. 1 a day for 1 week then 2 tabs a day thereafter 60 capsule 3  . zolpidem (AMBIEN) 5 MG tablet TAKE 1 TABLET BY MOUTH EVERY DAY AT BEDTIME AS NEEDED FOR SLEEP 30 tablet 2   Current Facility-Administered Medications  Medication Dose Route Frequency Provider Last Rate Last Dose  . cyanocobalamin ((VITAMIN B-12)) injection 1,000 mcg  1,000 mcg Subcutaneous Q30 days [name], MD   1,000 mcg at 09/08/17 1408    Allergies as of 12/31/2017 - Review Complete 12/31/2017  Allergen Reaction Noted  . Celecoxib    . Prednisone Itching 01/13/2013  . Macrodantin [nitrofurantoin macrocrystal] Rash 06/03/2016  . Penicillins Hives 06/03/2016    Family History  Problem Relation Age of Onset  . Lung cancer Father   . Heart disease Father        MI at age 64  . Alcohol abuse Father   . Alzheimer's disease Mother   . Dementia Mother   . Other Mother        twisted colon   . Stomach cancer Neg Hx   . Colon cancer Neg Hx     Social History   Socioeconomic History  . Marital status: Married    Spouse name: Not on file  . Number of children: Not on file  . Years of education: Not on file  . Highest education level: Not on file  Occupational History  . Not on  file  Social Needs  . Financial resource strain: Not on file  . Food insecurity:    Worry: Not on file    Inability: Not on file  . Transportation needs:    Medical: Not on file    Non-medical: Not on file  Tobacco Use  . Smoking status: Current Every Day Smoker    Packs/day: 0.50    Types: Cigarettes  . Smokeless tobacco: Never Used  Substance and Sexual Activity  . Alcohol use: No  . Drug use: No  . Sexual activity: Not Currently  Lifestyle  . Physical activity:    Days per week: Not on file    Minutes per session: Not on file  . Stress: Not on file  Relationships  . Social connections:    Talks  on phone: Not on file    Gets together: Not on file    Attends religious service: Not on file    Active member of club or organization: Not on file    Attends meetings of clubs or organizations: Not on file    Relationship status: Not on file  . Intimate partner violence:    Fear of current or ex partner: Not on file    Emotionally abused: Not on file    Physically abused: Not on file    Forced sexual activity: Not on file  Other Topics Concern  . Not on file  Social History Narrative  . Not on file    Review of Systems:    Constitutional: No weight loss, fever or chills Cardiovascular: No chest pain Respiratory: +SOB Gastrointestinal: See HPI and otherwise negative   Physical Exam:  Vital signs: BP (!) 154/72   Pulse 63   Ht 5' 4.5" (1.638 m)   Wt 212 lb (96.2 kg)   BMI 35.83 kg/m    Constitutional:   Pleasant overweight Caucasian female appears to be in NAD, Well developed, Well nourished, alert and cooperative Respiratory: Respirations even and unlabored. Lungs clear to auscultation bilaterally.   No wheezes, crackles, or rhonchi.  Cardiovascular: Normal S1, S2. +5/6 murmur. Regular rate and rhythm. No peripheral edema, cyanosis or pallor.  Gastrointestinal:  Soft, nondistended, nontender. No rebound or guarding. Normal bowel sounds. No appreciable masses or  hepatomegaly. Rectal:  Not performed.  Psychiatric: Demonstrates good judgement and reason without abnormal affect or behaviors.  RELEVANT LABS AND IMAGING: CBC    Component Value Date/Time   WBC 11.3 (H) 12/23/2017 1213   RBC 4.01 12/23/2017 1213   HGB 9.7 (L) 12/23/2017 1213   HCT 30.3 (L) 12/23/2017 1213   HCT 32.4 (L) 09/15/2016 0442   PLT 152 12/23/2017 1213   MCV 75.6 (L) 12/23/2017 1213   MCH 24.2 (L) 12/23/2017 1213   MCHC 32.0 12/23/2017 1213   RDW 14.7 12/23/2017 1213   LYMPHSABS 2,870 12/23/2017 1213   MONOABS 0.8 12/18/2016 1015   EOSABS 305 12/23/2017 1213   BASOSABS 113 12/23/2017 1213    CMP     Component Value Date/Time   NA 139 12/23/2017 1213   K 3.6 12/23/2017 1213   CL 102 12/23/2017 1213   CO2 28 12/23/2017 1213   GLUCOSE 147 (H) 12/23/2017 1213   BUN 14 12/23/2017 1213   CREATININE 0.95 12/23/2017 1213   CALCIUM 9.4 12/23/2017 1213   PROT 6.6 11/28/2017 1457   ALBUMIN 3.7 03/26/2017 0908   AST 18 11/28/2017 1457   ALT 8 11/28/2017 1457   ALKPHOS 88 03/26/2017 0908   BILITOT 0.6 11/28/2017 1457   GFRNONAA 61 12/23/2017 1213   GFRAA 71 12/23/2017 1213   Iron/TIBC/Ferritin/ %Sat    Component Value Date/Time   IRON 39 (L) 11/28/2017 1457   TIBC 395 11/28/2017 1457   FERRITIN 9 (L) 11/28/2017 1457   IRONPCTSAT 10 (L) 11/28/2017 1457   Assessment: 1. IDA: 12/23/17 hemoglobin 9.7, iron labs low, no improvement with once daily iron over the past month, FIT testing positive; consider GI source of blood loss vs other 2. Liver cirrhosis secondary to [place]: patient has not been followed by us in some time, no increase in abdominal distention or peripheral edema  Plan: 1. Ordered right upper quadrant ultrasound for HCC screening. 2. Discussed with patient that she will need an EGD and colonoscopy for further workup of iron deficiency anemia. At   this time I would like to postpone this if possible until after she completes her cardiac workup due to  worsening aortic stenosis. She agrees with this plan. 3. Recommend patient get labs every 3-4 weeks to check her hemoglobin to ensure that she does not need iron infusion or blood transfusion in the interim. Patient tells me she believes her primary care provider is doing this for her. 4. In the interim, recommend patient increase her iron to 3 times daily. Did discuss that if this causes constipation she can use a laxative such as MiraLAX or a stool softener. 5. Patient was scheduled a follow-up appointment in late July with Dr. [name] to discuss EGD and colonoscopy. Hopefully this will be after she has had her entire cardiac workup. Did discuss with patient that if she is scheduled for a catheterization or anything else comes up prior to that, she can call and let us know and we can always rearrange. If her anemia becomes worse, obviously we can discuss sooner endoscopic evaluation.  Kelly Newer, PA-C [facility] 12/31/2017, 10:49 AM  Cc: [name], MD

## 2018-01-02 ENCOUNTER — Ambulatory Visit (INDEPENDENT_AMBULATORY_CARE_PROVIDER_SITE_OTHER): Payer: Medicare HMO | Admitting: Family Medicine

## 2018-01-02 ENCOUNTER — Encounter: Payer: Self-pay | Admitting: Family Medicine

## 2018-01-02 VITALS — BP 146/84 | HR 52 | Temp 98.3°F | Resp 16 | Ht 64.5 in | Wt 212.0 lb

## 2018-01-02 DIAGNOSIS — D509 Iron deficiency anemia, unspecified: Secondary | ICD-10-CM | POA: Diagnosis not present

## 2018-01-02 DIAGNOSIS — R059 Cough, unspecified: Secondary | ICD-10-CM

## 2018-01-02 DIAGNOSIS — I350 Nonrheumatic aortic (valve) stenosis: Secondary | ICD-10-CM

## 2018-01-02 DIAGNOSIS — R05 Cough: Secondary | ICD-10-CM | POA: Diagnosis not present

## 2018-01-02 DIAGNOSIS — R0602 Shortness of breath: Secondary | ICD-10-CM | POA: Diagnosis not present

## 2018-01-02 LAB — BASIC METABOLIC PANEL WITH GFR
BUN: 8 mg/dL (ref 7–25)
CALCIUM: 8.8 mg/dL (ref 8.6–10.4)
CO2: 25 mmol/L (ref 20–32)
CREATININE: 0.87 mg/dL (ref 0.50–0.99)
Chloride: 111 mmol/L — ABNORMAL HIGH (ref 98–110)
GFR, Est African American: 79 mL/min/{1.73_m2} (ref >=60)
GFR, Est Non African American: 68 mL/min/{1.73_m2} (ref >=60)
GLUCOSE: 120 mg/dL — AB (ref 65–99)
Potassium: 4 mmol/L (ref 3.5–5.3)
Sodium: 143 mmol/L (ref 135–146)

## 2018-01-02 MED ORDER — PANTOPRAZOLE SODIUM 40 MG PO TBEC: 40.0000 mg | DELAYED_RELEASE_TABLET | Freq: Every day | ORAL | 3 refills | Status: DC

## 2018-01-02 NOTE — Progress Notes (Signed)
Subjective:    Patient ID: Kelly [name], female    DOB: January 24, 1951, 67 y.o.   MRN: [id number]  Insomnia     10/2016 Patient is a new patient here today with her son to establish care. Apparently she was recently admitted to the hospital with altered mental status. She was diagnosed with benzodiazepine intoxication and underlying dementia. She is here today for follow-up. She also apparently had low B12 levels which was confirmed in the hospital and she's been taking B12 tablets. Patient states that she is always been extremely anxious to stay at home alone. Recently her husband has been in a rehabilitation facility after surgery. Therefore she was taking Xanax prescribed by her previous primary care provider in addition to Ambien which she will take at night to help her sleep. Apparently she has been on Ambien for many many years due to chronic insomnia. Her son was unable to get her to answer the door one morning and broke in the back door to find his mother extremely intoxicated and confused. She was taken to the hospital. Their CAT scan showed mild diffuse cortical thickening. Ammonia levels were normal. RPR was normal. TSH was normal. Hemoglobin A1c was 5.4 PA B12 level was low at less than 200. Otherwise workup was unremarkable aside from some thrombocytopenia which is chronic and a mild anemia presumed to be secondary to B12 deficiency and iron deficiency. Since discharge from the hospital she is not taking any further Xanax. Her son has also not allowed her to have Ambien over the last month. She is here today for evaluation. I performed a Mini-Mental status exam. The patient is easily able to score is 5 out of 5. She also easily tells me the location. She is able to remember 3 objects easily. She can spell world in reverse and perform serial sevens. However the patient does have a noticeable word finding difficulty just in general communication. She has a difficult time remembering the names of  objects and remembering what she's trying to tell me. On clock drawing assessment, the patient is able to correctly draw the clock and the time. Takes for a prolonged period of time to do this. I then performed a [place] cognitive assessment and the patient had an extremely difficult time performing the pattern test alternating between letters and numbers in order. Even after I explained to the patient she has a difficult time drawing the areas appropriately. Therefore it appears that she has some mild dementia with some mild anomia.  Patient is requesting a refill on her Ambien.  She also has a history of lymphocytic colitis previously managed by Dr. [name]. She has not seen him in quite some time and she is not taking that the desonide that he had previously prescribed. As a result she has frequent diarrhea which could be contributing to her B12 deficiency the fact of malabsorptive diarrhea. She also has underlying history of nonalcoholic steatohepatitis complicated by thrombocytopenia. Platelet counts in the hospital between 70 and 80. She has a mild anemia with hemoglobin between 11 and 12. Her diabetes thankfully was well controlled at 5.4 and her TSH was within normal limits.  At that time, my plan was: The patient's memory loss was likely multifactorial. Given the cortical thickening on CT scan and some of the findings demonstrated today on her cognitive assessment she appears to have a very mild dementia. It is difficult for me to gauge this given the fact I am just meeting the patient today. I  believe this is likely exacerbated by the combination of benzodiazepines which she [place] have taken several and her Ambien. This could also be affected by her B12 deficiency. Therefore I recommended that we treat the patient's B12 deficiency with B12 injections parenterally 1000 g every month as I'm concerned that she does not absorb B12 well to the gastrointestinal tract due to her lymphocytic colitis. I've also  recommended that she start iron replacement. I spent more than 45 minutes with this patient discussing her diagnosis and evaluating her. I strongly recommended that we keep her away from any medication that can exacerbate her memory particularly benzodiazepines and Ambien. I believe a lot of her problems are exacerbated by anxiety. Therefore I recommended neuropsychological consultation to determine medication options. Perhaps trazodone would be a better option for this patient rather than Ambien. I would like to see her back in one month to discuss and follow-up. I refilled her other medications. No lab work was obtained at this visit but I reviewed the battery of lab work that was obtained at her most recent hospitalization:  11/07/16 Patient is under tremendous stress. Her husband is battling congestive heart failure and recently underwent an amputation. She states that he is constantly unloading his burdens and complaints onto her. However he is in denial about her medical problems. She feels very little support. She continues to battle anxiety and insomnia. She reports poor concentration, depression, anhedonia. She has very little energy. In the past she is tried and failed Zoloft, Wellbutrin, Prozac," several others". She denies any suicidal ideation. She denies any hallucinations or delusions. She is wanting to try Xanax again to help her sleep. Please see the previous office visit we discussed the problems of Xanax created for this patient.  AT that time, my plan was: I spent more than 25 minutes with this patient discussing her problems. I do believe she has mild dementia but also believe she could be suffering from depression. I would like to try the patient on Trintellix 5 mg a day for 1 week and gradually increased to 10 mg a day. Recheck in one month to see if there is benefit. Try to avoid high-risk medications such as benzodiazepines. She can continue use the trazodone as needed for  sleep.  03/14/17 Patient is here today for follow-up. I had asked the patient to return to recheck a fasting lipid panel, recheck her TSH, recheck her hemoglobin A1c to monitor the management of her diabetes, hypothyroidism, and to monitor for evidence of hyperlipidemia. However the patient upon arrival tells me that her depression is worse. Spent more than 30 minutes with the patient in discussion. He does not include her in any financial decisions. He does not seem to respect her take into consideration her desires or her plans.  He also apparently had an affair for many years and has never asked for forgiveness. They have never discussed this normal had any kind of resolution regarding this. I believe that her stressors in her marriage are contributing to her depression. From the standpoint of her dementia, she has very mild memory loss. Just talking to the patient, is difficult to notice. However she stopped coming for her B12 injections and I believe is due to the fact that she forgot them. She states that she didn't and that she was confused on how often she needed to come. I recommended that she come once a month moving forward for B12 injections. Her blood pressure today is adequately controlled.  She denies  any hypoglycemia. She denies any polyuria, polydipsia, or blurred vision. She denies any tachycardia or palpitations. She denies any substantial changes in her weight.  At that time, my plan was: Blood pressure today is well controlled. I'll make no changes in her blood pressure medication. She has a history of nonalcoholic steatohepatitis. Therefore I've asked the patient to return fasting for fasting lipid panel as well as a CMP to monitor for dyslipidemia and monitor her liver function test. At that time I'll also check a TSH to monitor management of her hypothyroidism. I would also like to check a hemoglobin A1c and a urine microalbumin. If her A1c stays less than 6.5, I would like to discontinue  Amaryl due to the risk of hypoglycemia area and spent more than 30 minutes with the patient discussing her situation. Recommended marriage counseling. I believe that until they seek therapy and come to some type of understanding and forgiveness that she will harm her resentment that would contribute to her depression moving forward. I believe that she feels disrespected and a lack of love in the marriage and this is contributing to her depression.   09/08/17 Patient discontinued trintellix in November.  However she denies any depression today.  Her blood pressure slightly elevated 146/80.  She is on losartan/hydrochlorothiazide 100/12.5.  She denies any chest pain.  She does report occasional shortness of breath.  At her last visit, I obtain an echocardiogram because of her worsening murmur which confirms moderate to severe aortic stenosis.  She is scheduled to see a cardiologist later this month to discuss treatment options.  Therefore I do not want to lower preload significantly despite the elevated blood pressures because of aortic stenosis.  Patient denies syncope or near syncope or palpitations.  She is not checking her blood sugars regularly.  However she denies any hypoglycemic episodes.  She denies any polyuria, polydipsia, or blurry vision.  She denies any myalgias or right upper quadrant pain.  Recently her insurance has denied her Ambien.  She takes Ambien at night to help her sleep.  She has tried trazodone in the past without benefit.  She is also tried Belsomra in the past and this did not work as well.  Therefore she is requesting that I try to complete a prior authorization for the medication.  She is also here today to recheck her TSH.  However upon medication list review, the patient is not sure that she is actually taking levothyroxine.  At that time, my plan was: Patient has discontinued her antidepressant.  However she is doing well and I see no reason to resume medication that the patient  does not seem to require.  I would like to check a hemoglobin A1c to monitor the management of her diabetes mellitus.  If her hemoglobin A1c is significantly below 6, I would discontinue glimepiride due to the risk of hypoglycemia.  I will also monitor her liver function test regarding her history of cirrhosis secondary to [facility].  Her blood pressure slightly elevated today however I do not want to lower preload significantly because of her aortic stenosis.  I will switch her losartan HCTZ to lisinopril HCTZ 20/12.5, 2 tablets once a day due to the recent recall of losartan and difficulty finding that medication.  I will check a TSH to ensure that the patient is compliant with her levothyroxine.  Patient will go home and verify whether or not she is actually been taking the medication.  I will also check a fasting lipid  panel.  Goal LDL cholesterol is less than 100 because of the diabetes.  Regarding her aortic stenosis, she is scheduled to see the cardiologist later this month.   11/28/17 Patient feels she needs to resume her antidepressant.  She reports anhedonia, daily depression.  She feels excessive feelings of guilt.  She has crying spells.  Recently she heard her son tell his children that his parents could not care for themselves more or less watch after his kids.  This broke her heart.  She understands that she has dementia however this is only exacerbated her depression.  She also reports chronic fatigue.  She has trouble sleeping.  She has trouble concentrating.  She saw significant benefit on the antidepressant however since stopping it, the depression seems to spiral out of control.   At that time, my plan was:  Patient's depression has worsened.  Begin Effexor XR 75 mg p.o. every morning.  Increase to 150 mg in 1 week.  Reassess in 5 weeks.  Given the fact her fatigue has worsened, I will check lab work to ensure that her thyroid is adequately treated and that there has not been a decline in her  renal function, her CBC, or that her diabetes is out of control.  12/12/17 Office Visit on 12/23/2017  Component Date Value Ref Range Status  . WBC 12/23/2017 11.3* 3.8 - 10.8 Thousand/uL Final  . RBC 12/23/2017 4.01  3.80 - 5.10 Million/uL Final  . Hemoglobin 12/23/2017 9.7* 11.7 - 15.5 g/dL Final  . HCT 12/23/2017 30.3* 35.0 - 45.0 % Final  . MCV 12/23/2017 75.6* 80.0 - 100.0 fL Final  . MCH 12/23/2017 24.2* 27.0 - 33.0 pg Final  . MCHC 12/23/2017 32.0  32.0 - 36.0 g/dL Final  . RDW 12/23/2017 14.7  11.0 - 15.0 % Final  . Platelets 12/23/2017 152  140 - 400 Thousand/uL Final  . MPV 12/23/2017 11.2  7.5 - 12.5 fL Final  . Neutro Abs 12/23/2017 6,927  1,500 - 7,800 cells/uL Final  . Lymphs Abs 12/23/2017 2,870  850 - 3,900 cells/uL Final  . WBC mixed population 12/23/2017 1,085* 200 - 950 cells/uL Final  . Eosinophils Absolute 12/23/2017 305  15 - 500 cells/uL Final  . Basophils Absolute 12/23/2017 113  0 - 200 cells/uL Final  . Neutrophils Relative % 12/23/2017 61.3  % Final  . Total Lymphocyte 12/23/2017 25.4  % Final  . Monocytes Relative 12/23/2017 9.6  % Final  . Eosinophils Relative 12/23/2017 2.7  % Final  . Basophils Relative 12/23/2017 1.0  % Final  . Glucose, Bld 12/23/2017 147* 65 - 99 mg/dL Final   Comment: .            Fasting reference interval . For someone without known diabetes, a glucose value >125 mg/dL indicates that they Fabio have diabetes and this should be confirmed with a follow-up test. .   . BUN 12/23/2017 14  7 - 25 mg/dL Final  . Creat 12/23/2017 0.95  0.50 - 0.99 mg/dL Final   Comment: For patients >20 years of age, the reference limit for Creatinine is approximately 13% higher for people identified as African-American. .   . GFR, Est Non African American 12/23/2017 61  > OR = 60 mL/min/1.34m Final  . GFR, Est African American 12/23/2017 71  > OR = 60 mL/min/1.775mFinal  . BUN/Creatinine Ratio 0512/19/7588OT APPLICABLE  6 - 22 (calc) Final  .  Sodium 12/23/2017 139  135 - 146 mmol/L Final  .  Potassium 12/23/2017 3.6  3.5 - 5.3 mmol/L Final  . Chloride 12/23/2017 102  98 - 110 mmol/L Final  . CO2 12/23/2017 28  20 - 32 mmol/L Final  . Calcium 12/23/2017 9.4  8.6 - 10.4 mg/dL Final  . Brain Natriuretic Peptide 12/23/2017 35  <100 pg/mL Final   Comment: . BNP levels increase with age in the general population with the highest values seen in individuals greater than 34 years of age. Reference: J. Am. [place] Ar. Cardiol. 2002; 93:[id number]. .   Lab on 12/05/2017  Component Date Value Ref Range Status  . MICRO NUMBER: 12/05/2017 [id number]   Final  . SPECIMEN QUALITY: 12/05/2017 ADEQUATE   Final  . Source: 12/05/2017 NOT GIVEN   Final  . STATUS: 12/05/2017 FINAL   Final  . FECAL GLOBIN RESULT: 12/05/2017 Detected*  Final   Detected   As shown above, the patient was found to have significant iron deficiency anemia likely contributing to her fatigue.  Her stools were also positive for blood.  She has been referred to GI for evaluation of her iron deficiency anemia and guaiac positive stool.  Today she is here complaining of fatigue despite taking iron.  She is complaining of a chronic cough that has been going on for many weeks.  She has been battling a cough off and on since December.  Cough is nonproductive but does seem to be worse at night.  She is also having some right upper quadrant abdominal pain that comes and goes.  It is mild.  There are no exacerbating or alleviating factors.  She is afebrile.  She denies any melena or hematochezia.  She denies any hemoptysis.  At that time, my plan was:  Aortic valve stenosis, etiology of cardiac valve disease unspecified I believe her fatigue is multifactorial and likely related primarily to her severe iron deficiency anemia.  Patient is taking iron.  I explained to the patient will take at least 1 month for her blood counts to build back.  I would like to repeat a CBC in 1 month on the iron to see  if she is improving.  Also I am not certain why her stool is guaiac positive.  I explained to the patient that we need her to see GI for endoscopy to rule out a bleeding ulcer or other source of GI blood loss that [place] be explaining her right upper quadrant discomfort.  Given her cough, I would proceed with a chest x-ray however I believe her cough [place] be either due to acid reflux given its exacerbation while lying supine, or possibly due to her ACE inhibitor.  I will have the patient discontinue lisinopril HCTZ and replace it with losartan 100 mg a day.  I have also recommended that she start taking Prilosec 20 mg a day over-the-counter and see if her symptoms improve.  If right-sided abdominal pain does not improve, consider work-up of biliary tract disease/cholelithiasis.  12/19/17 The patient's cough is getting worse.  Occasionally is productive of gray sputum but usually is dry.  She reports increasing shortness of breath.  She also reports significant weight gain over the last few days despite not changing her diet.  She reports increasing dyspnea on exertion.  She reports orthopnea.  She also reports paroxysmal nocturnal dyspnea.  Oxygen saturation is down to 93% on room air exam is significant for bibasilar crackles and a loud 3/6 to 4/6 systolic ejection murmur heard best over the aortic valve.  Patient has known moderate to  severe aortic stenosis.  My concern is now that her cough Z[place] be a sign of pulmonary edema despite having a normal chest x-ray last week particular given her sudden weight gain and increasing shortness of breath.  She denies any fevers.  She does occasionally have wheezing.  She adamantly denies chest pain or syncope.  At that time, my plan was: Patient's cough [place] in fact be multifactorial however her exam today significant for sudden weight gain, 3/6 to 4/6 systolic ejection murmur, bibasilar crackles, orthopnea, and a pulse oximetry of 93% on room air.  All these findings are  concerning for acute pulmonary edema secondary to decreased systolic function or possibly her aortic stenosis.  I will send the patient immediately for a chest x-ray to rule out bilateral pneumonia.  If chest x-ray confirms pulmonary edema, I will start the patient on Lasix 40 mg twice daily and K-Dur 20 mEq p.o. daily and recheck the patient on Monday with express instructions to go to the hospital if getting worse.  Obviously of chest x-ray shows pneumonia, this plan will change in the patient will likely be placed on Levaquin.  12/23/17 Chest x-ray revealed no significant abnormalities aside from cardiomegaly which is my unofficial read.  Patient used Lasix as prescribed over the weekend 40 mg twice daily.  Her breathing has improved dramatically.  She has diuresed almost 12 pounds!  She denies any lightheadedness.  She denies any dry mouth.  She denies any dizziness however I am concerned about dehydration given the significant weight loss.  Her pulse oximetry has improved dramatically and she states that her breathing is back to her baseline.  The nagging cough that she experienced is also improving however I am not convinced that that is due to pulmonary edema but rather could be due to the ACE inhibitor that we recently stopped.  She is due also to recheck her anemia that was discovered in April.  She has been taking iron now for 1 month.  Hemoglobin was 12 in February.  It dropped to 9.5 on April 26.  She has been on iron ever since that time.  She is due to recheck it today.  She has an appointment to see gastroenterology as her FIT test on [place]ise 3 was positive for blood. Wt Readings from Last 3 Encounters:  01/02/18 212 lb (96.2 kg)  12/31/17 212 lb (96.2 kg)  12/23/17 207 lb (93.9 kg)  At that time, my plan was: Clinically her breathing is much better.  She has diuresed 12 pounds over the weekend.  Obviously I am now concerned about dehydration and prerenal azotemia as this was more than I was  anticipating.  I have recommended decreasing Lasix from 40 mg twice daily to 20 mg once daily and discontinuing potassium altogether.  I will recheck a BMP today to monitor electrolytes and renal function test.  I will also repeat a CBC to evaluate for improvement of her anemia on iron replacement after 1 month.  She has an appointment to see gastroenterology given her positive stool test for blood to evaluate for GI sources of blood loss.  Given her recent episode of what appears to be pulmonary edema causing shortness of breath, I will see if I can expedite her appointment to see her cardiologist as I am concerned that her pulmonary edema [place] be due to to her worsening aortic stenosis.  Her cough is also improving.  I believe the ACE inhibitor was playing a role in this.  Continue losartan and discontinue ACE inhibitor permanently  01/02/18 After the patient lost significant weight with diuresis, I decreased her Lasix to 20 mg a day to avoid dehydration and prerenal azotemia.  She is back to her usual weight which is 212 pounds.  She continues to state that her shortness of breath is better however she states that her cough has returned.  Some days she will cough constantly.  Other day she will cough only when she lays down at night to sleep.  Now the cough is nonproductive.  On exam, her pulmonary exam today is normal.  She is off her ACE inhibitor.  Her pulmonary edema has been treated aggressively with persistence of the cough.  However she is not on any medication to treat laryngo-esophageal reflux.  She denies any heartburn.  She recently saw GI who recommended waiting until after her cardiology evaluation in 1 month prior to determining/proceeding with an EGD or colonoscopy due to her iron deficiency anemia and positive stool test.  Past Medical History:  Diagnosis Date  . Adenomatous polyp   . Anxiety   . Aortic stenosis   . Arrhythmia   . Diabetes ([place])   . Diverticulosis   . Fatty liver   .  Hyperlipidemia   . Hypertension   . NASH (nonalcoholic steatohepatitis)   . Obesity   . SOB (shortness of breath)   . Thrombocytopenia ([place])   . Thyroid disease   . Vitamin D deficiency    Past Surgical History:  Procedure Laterality Date  . oopherectomy    . OVARIAN CYST REMOVAL     Current Outpatient Medications on File Prior to Visit  Medication Sig Dispense Refill  . ACCU-CHEK AVIVA PLUS test strip     . ACCU-CHEK SOFTCLIX LANCETS lancets     . atorvastatin (LIPITOR) 20 MG tablet Take 1 tablet (20 mg total) by mouth daily. 90 tablet 3  . Blood Glucose Monitoring Suppl (ACCU-CHEK AVIVA PLUS) w/Device KIT     . furosemide (LASIX) 40 MG tablet Take 1 tablet (40 mg total) by mouth 2 (two) times daily. 60 tablet 3  . glimepiride (AMARYL) 1 MG tablet TAKE 1 TABLET EVERY DAY WITH BREAKFAST 90 tablet 1  . levothyroxine (SYNTHROID, LEVOTHROID) 75 MCG tablet Take 1 tablet (75 mcg total) by mouth daily. 90 tablet 3  . losartan (COZAAR) 100 MG tablet Take 1 tablet (100 mg total) by mouth daily. Stop lisinopril hctz 90 tablet 3  . metFORMIN (GLUCOPHAGE-XR) 500 MG 24 hr tablet TAKE 2 TABLETS TWICE DAILY 360 tablet 1  . metoprolol tartrate (LOPRESSOR) 25 MG tablet TAKE 1 TABLET TWICE DAILY 180 tablet 3  . potassium chloride SA (K-DUR,KLOR-CON) 20 MEQ tablet Take 1 tablet (20 mEq total) by mouth daily. 30 tablet 0  . traZODone (DESYREL) 50 MG tablet TAKE 1 TABLET AT BEDTIME AS NEEDED FOR SLEEP 90 tablet 1  . venlafaxine XR (EFFEXOR XR) 75 MG 24 hr capsule Take 1 capsule (75 mg total) by mouth daily with breakfast. 1 a day for 1 week then 2 tabs a day thereafter 60 capsule 3  . zolpidem (AMBIEN) 5 MG tablet TAKE 1 TABLET BY MOUTH EVERY DAY AT BEDTIME AS NEEDED FOR SLEEP 30 tablet 2   Current Facility-Administered Medications on File Prior to Visit  Medication Dose Route Frequency Provider Last Rate Last Dose  . cyanocobalamin ((VITAMIN B-12)) injection 1,000 mcg  1,000 mcg Subcutaneous Q30 days  [name], MD   1,000 mcg at 09/08/17  1408   Allergies  Allergen Reactions  . Celecoxib   . Prednisone Itching    Heart rate increased  . Macrodantin [Nitrofurantoin Macrocrystal] Rash  . Penicillins Hives    Per family   Social History   Socioeconomic History  . Marital status: Married    Spouse name: Not on file  . Number of children: Not on file  . Years of education: Not on file  . Highest education level: Not on file  Occupational History  . Not on file  Social Needs  . Financial resource strain: Not on file  . Food insecurity:    Worry: Not on file    Inability: Not on file  . Transportation needs:    Medical: Not on file    Non-medical: Not on file  Tobacco Use  . Smoking status: Current Every Day Smoker    Packs/day: 0.50    Types: Cigarettes  . Smokeless tobacco: Never Used  Substance and Sexual Activity  . Alcohol use: No  . Drug use: No  . Sexual activity: Not Currently  Lifestyle  . Physical activity:    Days per week: Not on file    Minutes per session: Not on file  . Stress: Not on file  Relationships  . Social connections:    Talks on phone: Not on file    Gets together: Not on file    Attends religious service: Not on file    Active member of club or organization: Not on file    Attends meetings of clubs or organizations: Not on file    Relationship status: Not on file  . Intimate partner violence:    Fear of current or ex partner: Not on file    Emotionally abused: Not on file    Physically abused: Not on file    Forced sexual activity: Not on file  Other Topics Concern  . Not on file  Social History Narrative  . Not on file   Family History  Problem Relation Age of Onset  . Lung cancer Father   . Heart disease Father        MI at age 65  . Alcohol abuse Father   . Alzheimer's disease Mother   . Dementia Mother   . Other Mother        twisted colon   . Stomach cancer Neg Hx   . Colon cancer Neg Hx       Review of  Systems  Psychiatric/Behavioral: The patient has insomnia.   All other systems reviewed and are negative.      Objective:   Physical Exam  Constitutional: She is oriented to person, place, and time. She appears well-developed and well-nourished.  HENT:  Right Ear: External ear normal.  Left Ear: External ear normal.  Nose: Nose normal.  Mouth/Throat: Oropharynx is clear and moist. No oropharyngeal exudate.  Eyes: Pupils are equal, round, and reactive to light. Conjunctivae and EOM are normal.  Neck: Neck supple. No JVD present. No thyromegaly present.  Cardiovascular: Normal rate and regular rhythm.  Murmur heard.  Systolic murmur is present with a grade of 3/6. Pulmonary/Chest: Effort normal. No respiratory distress. She has no wheezes. She has no rales.  Abdominal: Soft. Bowel sounds are normal. She exhibits no distension. There is no tenderness. There is no rebound and no guarding.  Musculoskeletal: She exhibits no edema.  Lymphadenopathy:    She has no cervical adenopathy.  Neurological: She is alert and oriented to person, place, and  time. She has normal reflexes. No cranial nerve deficit. She exhibits normal muscle tone. Coordination normal.  Psychiatric: She has a normal mood and affect. Her behavior is normal. Judgment and thought content normal.  Vitals reviewed.         Assessment & Plan:  Cough - Plan: BASIC METABOLIC PANEL WITH GFR  Aortic valve stenosis, etiology of cardiac valve disease unspecified  Iron deficiency anemia, unspecified iron deficiency anemia type  SOB (shortness of breath) Patient states her shortness of breath is better however she continues to have a chronic constant cough.  I will add Protonix 40 mg a day for possible acid reflux/laryngo-esophageal reflux as a cause of her cough.  If cough continues and persist beyond this after 2 to 3 weeks, I would recommend pulmonology consultation.  Meanwhile await the results of her echocardiogram and  cardiology follow-up about her aortic valve stenosis.  X-ray was reassuring and did not show severe pulmonary edema.  Anemia is stable and the patient has follow-up scheduled with GI after she meets with the cardiologist in 1 month.  Patient can resume Ambien 5 mg a day as needed for insomnia

## 2018-01-05 ENCOUNTER — Ambulatory Visit (HOSPITAL_COMMUNITY)
Admission: RE | Admit: 2018-01-05 | Discharge: 2018-01-05 | Disposition: A | Discharge status: Home / Self Care | Payer: Medicare HMO | Source: Ambulatory Visit | Attending: Physician Assistant | Admitting: Physician Assistant

## 2018-01-05 DIAGNOSIS — K7460 Unspecified cirrhosis of liver: Secondary | ICD-10-CM

## 2018-01-05 DIAGNOSIS — K7581 Nonalcoholic steatohepatitis (NASH): Secondary | ICD-10-CM | POA: Insufficient documentation

## 2018-01-05 DIAGNOSIS — D509 Iron deficiency anemia, unspecified: Secondary | ICD-10-CM | POA: Diagnosis not present

## 2018-01-14 ENCOUNTER — Other Ambulatory Visit: Payer: Self-pay | Admitting: Family Medicine

## 2018-01-15 ENCOUNTER — Other Ambulatory Visit: Payer: Self-pay | Admitting: Family Medicine

## 2018-01-15 MED ORDER — POTASSIUM CHLORIDE CRYS ER 20 MEQ PO TBCR: 20.0000 meq | EXTENDED_RELEASE_TABLET | Freq: Every day | ORAL | 3 refills | Status: DC

## 2018-01-16 ENCOUNTER — Ambulatory Visit: Payer: Medicare HMO | Admitting: Family Medicine

## 2018-01-22 ENCOUNTER — Encounter: Payer: Self-pay | Admitting: Family Medicine

## 2018-01-27 ENCOUNTER — Ambulatory Visit (HOSPITAL_COMMUNITY): Payer: Medicare HMO | Attending: Cardiovascular Disease

## 2018-01-27 ENCOUNTER — Other Ambulatory Visit: Payer: Self-pay

## 2018-01-27 DIAGNOSIS — I119 Hypertensive heart disease without heart failure: Secondary | ICD-10-CM | POA: Insufficient documentation

## 2018-01-27 DIAGNOSIS — I313 Pericardial effusion (noninflammatory): Secondary | ICD-10-CM | POA: Diagnosis not present

## 2018-01-27 DIAGNOSIS — Z720 Tobacco use: Secondary | ICD-10-CM | POA: Insufficient documentation

## 2018-01-27 DIAGNOSIS — I082 Rheumatic disorders of both aortic and tricuspid valves: Secondary | ICD-10-CM | POA: Diagnosis not present

## 2018-01-27 DIAGNOSIS — E119 Type 2 diabetes mellitus without complications: Secondary | ICD-10-CM | POA: Diagnosis not present

## 2018-01-27 DIAGNOSIS — I350 Nonrheumatic aortic (valve) stenosis: Secondary | ICD-10-CM | POA: Diagnosis not present

## 2018-02-09 ENCOUNTER — Encounter (INDEPENDENT_AMBULATORY_CARE_PROVIDER_SITE_OTHER): Payer: Self-pay

## 2018-02-09 ENCOUNTER — Encounter: Payer: Self-pay | Admitting: Cardiovascular Disease

## 2018-02-09 ENCOUNTER — Ambulatory Visit: Payer: 59 | Admitting: Cardiovascular Disease

## 2018-02-09 VITALS — BP 140/80 | HR 67 | Ht 64.5 in | Wt 204.8 lb

## 2018-02-09 DIAGNOSIS — I350 Nonrheumatic aortic (valve) stenosis: Secondary | ICD-10-CM | POA: Diagnosis not present

## 2018-02-09 NOTE — Progress Notes (Signed)
Chief Complaint  Patient presents with  . Follow-up    aortic stenosis   History of Present Illness: 67 yo female with history of DM, HTN, HLD, memory issues,  tobacco abuse and aortic stenosis who is here today for cardiac follow up. I saw her as a new patient in February 2019 for aortic valve stenosis. Echo December 2018 with normal LV systolic function. The aortic valve was thickened with restricted leaflet excursion, mean gradient 36 mmHg, peak gradient 59 mmHg, AVA 1.22 cm2, DVI 0.39. She had mild dyspnea with exertion with fatigue and mild dizziness at times. No exertional chest pain. No near syncope, syncope or LE edema. Her aortic stenosis was moderate by echo criteria. Echo 01/27/18 with normal Lv function, moderately severe AS with mean gradient of 37 mmHg, peak gradient 64 mmHg, AVA 1.17cm2, DVI 0.37.   She is here today for follow up. The patient denies any chest pain, palpitations, orthopnea, PND, dizziness, near syncope or syncope. She describes being fatigued with overall lack of energy. She has been found to be anemic in primary care. No change in dyspnea. No chest pain. No LE edema.   Primary Care Physician: [name], MD  Past Medical History:  Diagnosis Date  . Acute encephalopathy 09/14/2016  . Adenomatous polyp   . Anxiety   . Aortic stenosis   . Arrhythmia   . Dementia 09/16/2016  . Diabetes ([place])   . DIVERTICULITIS OF COLON 06/14/2008   Qualifier: Diagnosis of  By: [name]    . Diverticulosis   . DM (diabetes mellitus), type 2 ([place]) 09/15/2016  . Fatty liver   . Hyperlipidemia   . Hypertension   . Irritable bowel syndrome 04/06/2008   Centricity Description: IBS Qualifier: Diagnosis of  By: [name] CMA ([name]), [name]   Centricity Description: IRRITABLE BOWEL SYNDROME Qualifier: Diagnosis of  By: [name] MD, [name]   . Liver cirrhosis secondary to NASH ([place]) 09/16/2016  . NASH (nonalcoholic steatohepatitis)   . Obesity   . SOB (shortness of breath)     . Splenomegaly 09/16/2016  . Thrombocytopenia ([place])   . Thyroid disease   . Vitamin B 12 deficiency 09/16/2016  . Vitamin D deficiency     Past Surgical History:  Procedure Laterality Date  . oopherectomy    . OVARIAN CYST REMOVAL      Current Outpatient Medications  Medication Sig Dispense Refill  . ACCU-CHEK AVIVA PLUS test strip     . ACCU-CHEK SOFTCLIX LANCETS lancets     . atorvastatin (LIPITOR) 20 MG tablet TAKE 1 TABLET (20 MG TOTAL) BY MOUTH DAILY. 90 tablet 3  . Blood Glucose Monitoring Suppl (ACCU-CHEK AVIVA PLUS) w/Device KIT     . furosemide (LASIX) 40 MG tablet Take 1 tablet (40 mg total) by mouth 2 (two) times daily. 60 tablet 3  . glimepiride (AMARYL) 1 MG tablet TAKE 1 TABLET EVERY DAY WITH BREAKFAST 90 tablet 1  . levothyroxine (SYNTHROID, LEVOTHROID) 75 MCG tablet Take 1 tablet (75 mcg total) by mouth daily. 90 tablet 3  . lisinopril-hydrochlorothiazide (PRINZIDE,ZESTORETIC) 20-12.5 MG tablet     . losartan (COZAAR) 100 MG tablet Take 1 tablet (100 mg total) by mouth daily. Stop lisinopril hctz 90 tablet 3  . metFORMIN (GLUCOPHAGE-XR) 500 MG 24 hr tablet TAKE 2 TABLETS TWICE DAILY 360 tablet 1  . metoprolol tartrate (LOPRESSOR) 25 MG tablet TAKE 1 TABLET TWICE DAILY 180 tablet 3  . pantoprazole (PROTONIX) 40 MG tablet Take 1 tablet (  40 mg total) by mouth daily. 30 tablet 3  . potassium chloride SA (K-DUR,KLOR-CON) 20 MEQ tablet Take 1 tablet (20 mEq total) by mouth daily. 90 tablet 3  . traZODone (DESYREL) 50 MG tablet TAKE 1 TABLET AT BEDTIME AS NEEDED FOR SLEEP 90 tablet 1  . zolpidem (AMBIEN) 5 MG tablet TAKE 1 TABLET BY MOUTH EVERY DAY AT BEDTIME AS NEEDED FOR SLEEP 30 tablet 2  . venlafaxine XR (EFFEXOR XR) 75 MG 24 hr capsule Take 1 capsule (75 mg total) by mouth daily with breakfast. 1 a day for 1 week then 2 tabs a day thereafter (Patient not taking: Reported on 02/09/2018) 60 capsule 3   Current Facility-Administered Medications  Medication Dose Route  Frequency Provider Last Rate Last Dose  . cyanocobalamin ((VITAMIN B-12)) injection 1,000 mcg  1,000 mcg Subcutaneous Q30 days [name], MD   1,000 mcg at 09/08/17 1408    Allergies  Allergen Reactions  . Celecoxib   . Prednisone Itching    Heart rate increased  . Macrodantin [Nitrofurantoin Macrocrystal] Rash  . Penicillins Hives    Per family    Social History   Socioeconomic History  . Marital status: Married    Spouse name: Not on file  . Number of children: Not on file  . Years of education: Not on file  . Highest education level: Not on file  Occupational History  . Not on file  Social Needs  . Financial resource strain: Not on file  . Food insecurity:    Worry: Not on file    Inability: Not on file  . Transportation needs:    Medical: Not on file    Non-medical: Not on file  Tobacco Use  . Smoking status: Current Every Day Smoker    Packs/day: 0.50    Types: Cigarettes  . Smokeless tobacco: Never Used  Substance and Sexual Activity  . Alcohol use: No  . Drug use: No  . Sexual activity: Not Currently  Lifestyle  . Physical activity:    Days per week: Not on file    Minutes per session: Not on file  . Stress: Not on file  Relationships  . Social connections:    Talks on phone: Not on file    Gets together: Not on file    Attends religious service: Not on file    Active member of club or organization: Not on file    Attends meetings of clubs or organizations: Not on file    Relationship status: Not on file  . Intimate partner violence:    Fear of current or ex partner: Not on file    Emotionally abused: Not on file    Physically abused: Not on file    Forced sexual activity: Not on file  Other Topics Concern  . Not on file  Social History Narrative  . Not on file    Family History  Problem Relation Age of Onset  . Lung cancer Father   . Heart disease Father        MI at age 32  . Alcohol abuse Father   . Alzheimer's disease Mother     . Dementia Mother   . Other Mother        twisted colon   . Stomach cancer Neg Hx   . Colon cancer Neg Hx     Review of Systems:  As stated in the HPI and otherwise negative.   BP 140/80   Pulse 67  Ht 5' 4.5" (1.638 m)   Wt 204 lb 12.8 oz (92.9 kg)   SpO2 98%   BMI 34.61 kg/m   Physical Examination:  General: Well developed, well nourished, NAD  HEENT: OP clear, mucus membranes moist  SKIN: warm, dry. No rashes. Neuro: No focal deficits  Musculoskeletal: Muscle strength 5/5 all ext  Psychiatric: Mood and affect normal  Neck: No JVD, no carotid bruits, no thyromegaly, no lymphadenopathy.  Lungs:Clear bilaterally, no wheezes, rhonci, crackles Cardiovascular: Regular rate and rhythm. Loud, harsh, late peaking systolic murmur.  Abdomen:Soft. Bowel sounds present. Non-tender.  Extremities: No lower extremity edema. Pulses are 2 + in the bilateral DP/PT.  Echo 01/27/18: Left ventricle: The cavity size was mildly dilated. Wall   thickness was increased in a pattern of mild LVH. Systolic   function was normal. The estimated ejection fraction was in the   range of 60% to 65%. Wall motion was normal; there were no   regional wall motion abnormalities. Doppler parameters are   consistent with abnormal left ventricular relaxation (grade 1   diastolic dysfunction). Doppler parameters are consistent with   high ventricular filling pressure. - Aortic valve: Valve mobility was restricted. There was moderate   to severe stenosis. There was no regurgitation. Peak velocity   (S): 400 cm/s. Mean gradient (S): 37 mm Hg. Peak gradient (S): 64   mm Hg. - Mitral valve: Transvalvular velocity was within the normal range.   There was no evidence for stenosis. There was no regurgitation. - Left atrium: The atrium was mildly dilated. - Right ventricle: The cavity size was normal. Wall thickness was   normal. Systolic function was normal. - Tricuspid valve: There was mild regurgitation. -  Pulmonary arteries: Systolic pressure was within the normal   range. PA peak pressure: 31 mm Hg (S). - Pericardium, extracardiac: A trivial pericardial effusion was   identified. - Global longitudinal strain -14.3% (abnormal).  Impressions:  - Compared with the echo 07/2017, the aortic stenosis severity is   unchanged. An acending aorta aneurysm was not appreciated.  EKG:  EKG is not ordered today. The ekg ordered today demonstrates   Recent Labs: 11/28/2017: ALT 8; TSH 1.08 12/23/2017: Brain Natriuretic Peptide 35; Hemoglobin 9.7; Platelets 152 01/02/2018: BUN 8; Creat 0.87; Potassium 4.0; Sodium 143   Lipid Panel    Component Value Date/Time   CHOL 172 09/08/2017 1204   TRIG 111 09/08/2017 1204   HDL 54 09/08/2017 1204   CHOLHDL 3.2 09/08/2017 1204   VLDL 20 03/26/2017 0908   LDLCALC 97 09/08/2017 1204     Wt Readings from Last 3 Encounters:  02/09/18 204 lb 12.8 oz (92.9 kg)  01/02/18 212 lb (96.2 kg)  12/31/17 212 lb (96.2 kg)     Other studies Reviewed: Additional studies/ records that were reviewed today include: . Review of the above records demonstrates:    Assessment and Plan:   1. Aortic stenosis: She has moderately severe AS by echo. She has fatigue but no chest pain, change in baseline dyspnea, CHF or dizziness. She does have fatigue. Her AS is unchanged from echo 6 months prior. I suspect that her fatigue is due to her anemia. I will see her back in 3 months to assess her clinical status. I have asked her to pursue workup of her anemia.   2. Tobacco abuse: She is asked to stop smoking  Current medicines are reviewed at length with the patient today.  The patient does not have concerns regarding medicines.  The following changes have been made:  no change  Labs/ tests ordered today include:   No orders of the defined types were placed in this encounter.    Disposition:   FU with me in 3 months   Signed, [name], MD 02/09/2018 10:24  AM    [facility] [place], [place], [place]  [address] Phone: [phone]; Fax: [phone]

## 2018-02-09 NOTE — Patient Instructions (Addendum)
Medication Instructions:  Your physician recommends that you continue on your current medications as directed. Please refer to the Current Medication list given to you today.   Labwork: none  Testing/Procedures: none  Follow-Up: Your physician recommends that you schedule a follow-up appointment in: 3 months. --October 11,2019 at 2:20    Any Other Special Instructions Will Be Listed Below (If Applicable).     If you need a refill on your cardiac medications before your next appointment, please call your pharmacy.

## 2018-02-17 ENCOUNTER — Ambulatory Visit (INDEPENDENT_AMBULATORY_CARE_PROVIDER_SITE_OTHER): Payer: Medicare HMO | Admitting: Family Medicine

## 2018-02-17 ENCOUNTER — Encounter: Payer: Self-pay | Admitting: Family Medicine

## 2018-02-17 VITALS — BP 168/90 | HR 74 | Temp 98.1°F | Resp 20 | Ht 64.5 in | Wt 210.0 lb

## 2018-02-17 DIAGNOSIS — I350 Nonrheumatic aortic (valve) stenosis: Secondary | ICD-10-CM

## 2018-02-17 DIAGNOSIS — D509 Iron deficiency anemia, unspecified: Secondary | ICD-10-CM

## 2018-02-17 LAB — COMPLETE METABOLIC PANEL WITH GFR
AG RATIO: 1.3 (calc) (ref 1.0–2.5)
ALT: 10 U/L (ref 6–29)
AST: 18 U/L (ref 10–35)
Albumin: 3.6 g/dL (ref 3.6–5.1)
Alkaline phosphatase (APISO): 94 U/L (ref 33–130)
BUN: 9 mg/dL (ref 7–25)
CALCIUM: 9.4 mg/dL (ref 8.6–10.4)
CO2: 25 mmol/L (ref 20–32)
CREATININE: 0.89 mg/dL (ref 0.50–0.99)
Chloride: 107 mmol/L (ref 98–110)
GFR, EST AFRICAN AMERICAN: 77 mL/min/{1.73_m2} (ref >=60)
GFR, Est Non African American: 66 mL/min/{1.73_m2} (ref >=60)
GLOBULIN: 2.8 g/dL (ref 1.9–3.7)
Glucose, Bld: 157 mg/dL — ABNORMAL HIGH (ref 65–99)
POTASSIUM: 3.6 mmol/L (ref 3.5–5.3)
SODIUM: 142 mmol/L (ref 135–146)
TOTAL PROTEIN: 6.4 g/dL (ref 6.1–8.1)
Total Bilirubin: 0.6 mg/dL (ref 0.2–1.2)

## 2018-02-17 NOTE — Progress Notes (Signed)
Subjective:    Patient ID: Kelly [name], female    DOB: [birth year], 67 y.o.   MRN: [id number]  Insomnia     10/2016 Patient is a new patient here today with her son to establish care. Apparently she was recently admitted to the hospital with altered mental status. She was diagnosed with benzodiazepine intoxication and underlying dementia. She is here today for follow-up. She also apparently had low B12 levels which was confirmed in the hospital and she's been taking B12 tablets. Patient states that she is always been extremely anxious to stay at home alone. Recently her husband has been in a rehabilitation facility after surgery. Therefore she was taking Xanax prescribed by her previous primary care provider in addition to Ambien which she will take at night to help her sleep. Apparently she has been on Ambien for many many years due to chronic insomnia. Her son was unable to get her to answer the door one morning and broke in the back door to find his mother extremely intoxicated and confused. She was taken to the hospital. Their CAT scan showed mild diffuse cortical thickening. Ammonia levels were normal. RPR was normal. TSH was normal. Hemoglobin A1c was 5.4 PA B12 level was low at less than 200. Otherwise workup was unremarkable aside from some thrombocytopenia which is chronic and a mild anemia presumed to be secondary to B12 deficiency and iron deficiency. Since discharge from the hospital she is not taking any further Xanax. Her son has also not allowed her to have Ambien over the last month. She is here today for evaluation. I performed a Mini-Mental status exam. The patient is easily able to score is 5 out of 5. She also easily tells me the location. She is able to remember 3 objects easily. She can spell world in reverse and perform serial sevens. However the patient does have a noticeable word finding difficulty just in general communication. She has a difficult time remembering the names of  objects and remembering what she's trying to tell me. On clock drawing assessment, the patient is able to correctly draw the clock and the time. Takes for a prolonged period of time to do this. I then performed a [place] cognitive assessment and the patient had an extremely difficult time performing the pattern test alternating between letters and numbers in order. Even after I explained to the patient she has a difficult time drawing the areas appropriately. Therefore it appears that she has some mild dementia with some mild anomia.  Patient is requesting a refill on her Ambien.  She also has a history of lymphocytic colitis previously managed by Dr. [name]. She has not seen him in quite some time and she is not taking that the desonide that he had previously prescribed. As a result she has frequent diarrhea which could be contributing to her B12 deficiency the fact of malabsorptive diarrhea. She also has underlying history of nonalcoholic steatohepatitis complicated by thrombocytopenia. Platelet counts in the hospital between 70 and 80. She has a mild anemia with hemoglobin between 11 and 12. Her diabetes thankfully was well controlled at 5.4 and her TSH was within normal limits.  At that time, my plan was: The patient's memory loss was likely multifactorial. Given the cortical thickening on CT scan and some of the findings demonstrated today on her cognitive assessment she appears to have a very mild dementia. It is difficult for me to gauge this given the fact I am just meeting the patient today. I  believe this is likely exacerbated by the combination of benzodiazepines which she [place] have taken several and her Ambien. This could also be affected by her B12 deficiency. Therefore I recommended that we treat the patient's B12 deficiency with B12 injections parenterally 1000 g every month as I'm concerned that she does not absorb B12 well to the gastrointestinal tract due to her lymphocytic colitis. I've also  recommended that she start iron replacement. I spent more than 45 minutes with this patient discussing her diagnosis and evaluating her. I strongly recommended that we keep her away from any medication that can exacerbate her memory particularly benzodiazepines and Ambien. I believe a lot of her problems are exacerbated by anxiety. Therefore I recommended neuropsychological consultation to determine medication options. Perhaps trazodone would be a better option for this patient rather than Ambien. I would like to see her back in one month to discuss and follow-up. I refilled her other medications. No lab work was obtained at this visit but I reviewed the battery of lab work that was obtained at her most recent hospitalization:  11/07/16 Patient is under tremendous stress. Her husband is battling congestive heart failure and recently underwent an amputation. She states that he is constantly unloading his burdens and complaints onto her. However he is in denial about her medical problems. She feels very little support. She continues to battle anxiety and insomnia. She reports poor concentration, depression, anhedonia. She has very little energy. In the past she is tried and failed Zoloft, Wellbutrin, Prozac," several others". She denies any suicidal ideation. She denies any hallucinations or delusions. She is wanting to try Xanax again to help her sleep. Please see the previous office visit we discussed the problems of Xanax created for this patient.  AT that time, my plan was: I spent more than 25 minutes with this patient discussing her problems. I do believe she has mild dementia but also believe she could be suffering from depression. I would like to try the patient on Trintellix 5 mg a day for 1 week and gradually increased to 10 mg a day. Recheck in one month to see if there is benefit. Try to avoid high-risk medications such as benzodiazepines. She can continue use the trazodone as needed for  sleep.  03/14/17 Patient is here today for follow-up. I had asked the patient to return to recheck a fasting lipid panel, recheck her TSH, recheck her hemoglobin A1c to monitor the management of her diabetes, hypothyroidism, and to monitor for evidence of hyperlipidemia. However the patient upon arrival tells me that her depression is worse. Spent more than 30 minutes with the patient in discussion. He does not include her in any financial decisions. He does not seem to respect her take into consideration her desires or her plans.  He also apparently had an affair for many years and has never asked for forgiveness. They have never discussed this normal had any kind of resolution regarding this. I believe that her stressors in her marriage are contributing to her depression. From the standpoint of her dementia, she has very mild memory loss. Just talking to the patient, is difficult to notice. However she stopped coming for her B12 injections and I believe is due to the fact that she forgot them. She states that she didn't and that she was confused on how often she needed to come. I recommended that she come once a month moving forward for B12 injections. Her blood pressure today is adequately controlled.  She denies  any hypoglycemia. She denies any polyuria, polydipsia, or blurred vision. She denies any tachycardia or palpitations. She denies any substantial changes in her weight.  At that time, my plan was: Blood pressure today is well controlled. I'll make no changes in her blood pressure medication. She has a history of nonalcoholic steatohepatitis. Therefore I've asked the patient to return fasting for fasting lipid panel as well as a CMP to monitor for dyslipidemia and monitor her liver function test. At that time I'll also check a TSH to monitor management of her hypothyroidism. I would also like to check a hemoglobin A1c and a urine microalbumin. If her A1c stays less than 6.5, I would like to discontinue  Amaryl due to the risk of hypoglycemia area and spent more than 30 minutes with the patient discussing her situation. Recommended marriage counseling. I believe that until they seek therapy and come to some type of understanding and forgiveness that she will harm her resentment that would contribute to her depression moving forward. I believe that she feels disrespected and a lack of love in the marriage and this is contributing to her depression.   09/08/17 Patient discontinued trintellix in November.  However she denies any depression today.  Her blood pressure slightly elevated 146/80.  She is on losartan/hydrochlorothiazide 100/12.5.  She denies any chest pain.  She does report occasional shortness of breath.  At her last visit, I obtain an echocardiogram because of her worsening murmur which confirms moderate to severe aortic stenosis.  She is scheduled to see a cardiologist later this month to discuss treatment options.  Therefore I do not want to lower preload significantly despite the elevated blood pressures because of aortic stenosis.  Patient denies syncope or near syncope or palpitations.  She is not checking her blood sugars regularly.  However she denies any hypoglycemic episodes.  She denies any polyuria, polydipsia, or blurry vision.  She denies any myalgias or right upper quadrant pain.  Recently her insurance has denied her Ambien.  She takes Ambien at night to help her sleep.  She has tried trazodone in the past without benefit.  She is also tried Belsomra in the past and this did not work as well.  Therefore she is requesting that I try to complete a prior authorization for the medication.  She is also here today to recheck her TSH.  However upon medication list review, the patient is not sure that she is actually taking levothyroxine.  At that time, my plan was: Patient has discontinued her antidepressant.  However she is doing well and I see no reason to resume medication that the patient  does not seem to require.  I would like to check a hemoglobin A1c to monitor the management of her diabetes mellitus.  If her hemoglobin A1c is significantly below 6, I would discontinue glimepiride due to the risk of hypoglycemia.  I will also monitor her liver function test regarding her history of cirrhosis secondary to [facility].  Her blood pressure slightly elevated today however I do not want to lower preload significantly because of her aortic stenosis.  I will switch her losartan HCTZ to lisinopril HCTZ 20/12.5, 2 tablets once a day due to the recent recall of losartan and difficulty finding that medication.  I will check a TSH to ensure that the patient is compliant with her levothyroxine.  Patient will go home and verify whether or not she is actually been taking the medication.  I will also check a fasting lipid  panel.  Goal LDL cholesterol is less than 100 because of the diabetes.  Regarding her aortic stenosis, she is scheduled to see the cardiologist later this month.   11/28/17 Patient feels she needs to resume her antidepressant.  She reports anhedonia, daily depression.  She feels excessive feelings of guilt.  She has crying spells.  Recently she heard her son tell his children that his parents could not care for themselves more or less watch after his kids.  This broke her heart.  She understands that she has dementia however this is only exacerbated her depression.  She also reports chronic fatigue.  She has trouble sleeping.  She has trouble concentrating.  She saw significant benefit on the antidepressant however since stopping it, the depression seems to spiral out of control.   At that time, my plan was:  Patient's depression has worsened.  Begin Effexor XR 75 mg p.o. every morning.  Increase to 150 mg in 1 week.  Reassess in 5 weeks.  Given the fact her fatigue has worsened, I will check lab work to ensure that her thyroid is adequately treated and that there has not been a decline in her  renal function, her CBC, or that her diabetes is out of control.  12/12/17 No visits with results within 1 Month(s) from this visit.  Latest known visit with results is:  Office Visit on 01/02/2018  Component Date Value Ref Range Status  . Glucose, Bld 01/02/2018 120* 65 - 99 mg/dL Final   Comment: .            Fasting reference interval . For someone without known diabetes, a glucose value between 100 and 125 mg/dL is consistent with prediabetes and should be confirmed with a follow-up test. .   . BUN 01/02/2018 8  7 - 25 mg/dL Final  . Creat 01/02/2018 0.87  0.50 - 0.99 mg/dL Final   Comment: For patients >22 years of age, the reference limit for Creatinine is approximately 13% higher for people identified as African-American. .   . GFR, Est Non African American 01/02/2018 68  > OR = 60 mL/min/1.22m Final  . GFR, Est African American 01/02/2018 79  > OR = 60 mL/min/1.726mFinal  . BUN/Creatinine Ratio 0546/65/9935OT APPLICABLE  6 - 22 (calc) Final  . Sodium 01/02/2018 143  135 - 146 mmol/L Final  . Potassium 01/02/2018 4.0  3.5 - 5.3 mmol/L Final  . Chloride 01/02/2018 111* 98 - 110 mmol/L Final  . CO2 01/02/2018 25  20 - 32 mmol/L Final  . Calcium 01/02/2018 8.8  8.6 - 10.4 mg/dL Final   As shown above, the patient was found to have significant iron deficiency anemia likely contributing to her fatigue.  Her stools were also positive for blood.  She has been referred to GI for evaluation of her iron deficiency anemia and guaiac positive stool.  Today she is here complaining of fatigue despite taking iron.  She is complaining of a chronic cough that has been going on for many weeks.  She has been battling a cough off and on since December.  Cough is nonproductive but does seem to be worse at night.  She is also having some right upper quadrant abdominal pain that comes and goes.  It is mild.  There are no exacerbating or alleviating factors.  She is afebrile.  She denies any melena  or hematochezia.  She denies any hemoptysis.  At that time, my plan was:  Aortic valve stenosis,  etiology of cardiac valve disease unspecified I believe her fatigue is multifactorial and likely related primarily to her severe iron deficiency anemia.  Patient is taking iron.  I explained to the patient will take at least 1 month for her blood counts to build back.  I would like to repeat a CBC in 1 month on the iron to see if she is improving.  Also I am not certain why her stool is guaiac positive.  I explained to the patient that we need her to see GI for endoscopy to rule out a bleeding ulcer or other source of GI blood loss that Sw[place]z be explaining her right upper quadrant discomfort.  Given her cough, I would proceed with a chest x-ray however I believe her cough Im be either due to acid reflux given its exacerbation while lying supine, or possibly due to her ACE inhibitor.  I will have the patient discontinue lisinopril HCTZ and replace it with losartan 100 mg a day.  I have also recommended that she start taking Prilosec 20 mg a day over-the-counter and see if her symptoms improve.  If right-sided abdominal pain does not improve, consider work-up of biliary tract disease/cholelithiasis.  12/19/17 The patient's cough is getting worse.  Occasionally is productive of gray sputum but usually is dry.  She reports increasing shortness of breath.  She also reports significant weight gain over the last few days despite not changing her diet.  She reports increasing dyspnea on exertion.  She reports orthopnea.  She also reports paroxysmal nocturnal dyspnea.  Oxygen saturation is down to 93% on room air exam is significant for bibasilar crackles and a loud 3/6 to 4/6 systolic ejection murmur heard best over the aortic valve.  Patient has known moderate to severe aortic stenosis.  My concern is now that her cough Needles be a sign of pulmonary edema despite having a normal chest x-ray last week particular given her  sudden weight gain and increasing shortness of breath.  She denies any fevers.  She does occasionally have wheezing.  She adamantly denies chest pain or syncope.  At that time, my plan was: Patient's cough [place] in fact be multifactorial however her exam today significant for sudden weight gain, 3/6 to 4/6 systolic ejection murmur, bibasilar crackles, orthopnea, and a pulse oximetry of 93% on room air.  All these findings are concerning for acute pulmonary edema secondary to decreased systolic function or possibly her aortic stenosis.  I will send the patient immediately for a chest x-ray to rule out bilateral pneumonia.  If chest x-ray confirms pulmonary edema, I will start the patient on Lasix 40 mg twice daily and K-Dur 20 mEq p.o. daily and recheck the patient on Monday with express instructions to go to the hospital if getting worse.  Obviously of chest x-ray shows pneumonia, this plan will change in the patient will likely be placed on Levaquin.  12/23/17 Chest x-ray revealed no significant abnormalities aside from cardiomegaly which is my unofficial read.  Patient used Lasix as prescribed over the weekend 40 mg twice daily.  Her breathing has improved dramatically.  She has diuresed almost 12 pounds!  She denies any lightheadedness.  She denies any dry mouth.  She denies any dizziness however I am concerned about dehydration given the significant weight loss.  Her pulse oximetry has improved dramatically and she states that her breathing is back to her baseline.  The nagging cough that she experienced is also improving however I am not convinced that  that is due to pulmonary edema but rather could be due to the ACE inhibitor that we recently stopped.  She is due also to recheck her anemia that was discovered in April.  She has been taking iron now for 1 month.  Hemoglobin was 12 in February.  It dropped to 9.5 on April 26.  She has been on iron ever since that time.  She is due to recheck it today.  She has  an appointment to see gastroenterology as her FIT test on Est[place] 3 was positive for blood. Wt Readings from Last 3 Encounters:  02/17/18 210 lb (95.3 kg)  02/09/18 204 lb 12.8 oz (92.9 kg)  01/02/18 212 lb (96.2 kg)  At that time, my plan was: Clinically her breathing is much better.  She has diuresed 12 pounds over the weekend.  Obviously I am now concerned about dehydration and prerenal azotemia as this was more than I was anticipating.  I have recommended decreasing Lasix from 40 mg twice daily to 20 mg once daily and discontinuing potassium altogether.  I will recheck a BMP today to monitor electrolytes and renal function test.  I will also repeat a CBC to evaluate for improvement of her anemia on iron replacement after 1 month.  She has an appointment to see gastroenterology given her positive stool test for blood to evaluate for GI sources of blood loss.  Given her recent episode of what appears to be pulmonary edema causing shortness of breath, I will see if I can expedite her appointment to see her cardiologist as I am concerned that her pulmonary edema Dingus be due to to her worsening aortic stenosis.  Her cough is also improving.  I believe the ACE inhibitor was playing a role in this.  Continue losartan and discontinue ACE inhibitor permanently  01/02/18 After the patient lost significant weight with diuresis, I decreased her Lasix to 20 mg a day to avoid dehydration and prerenal azotemia.  She is back to her usual weight which is 212 pounds.  She continues to state that her shortness of breath is better however she states that her cough has returned.  Some days she will cough constantly.  Other day she will cough only when she lays down at night to sleep.  Now the cough is nonproductive.  On exam, her pulmonary exam today is normal.  She is off her ACE inhibitor.  Her pulmonary edema has been treated aggressively with persistence of the cough.  However she is not on any medication to treat  laryngo-esophageal reflux.  She denies any heartburn.  She recently saw GI who recommended waiting until after her cardiology evaluation in 1 month prior to determining/proceeding with an EGD or colonoscopy due to her iron deficiency anemia and positive stool test.  At that time, my plan was: Patient states her shortness of breath is better however she continues to have a chronic constant cough.  I will add Protonix 40 mg a day for possible acid reflux/laryngo-esophageal reflux as a cause of her cough.  If cough continues and persist beyond this after 2 to 3 weeks, I would recommend pulmonology consultation.  Meanwhile await the results of her echocardiogram and cardiology follow-up about her aortic valve stenosis.  X-ray was reassuring and did not show severe pulmonary edema.  Anemia is stable and the patient has follow-up scheduled with GI after she meets with the cardiologist in 1 month.  Patient can resume Ambien 5 mg a day as needed for insomnia  02/17/18 Patient met with her cardiologist.  Cardiology is concerned that her anemia presents a more pressing urgent matter and that the source of her GI bleed needs to be determined prior to any surgical intervention to correct her underlying aortic stenosis.  Therefore they have deferred any intervention until follow-up in 3 months.  Patient has an appointment to see the gastroenterologist August 6.  She denies seeing any melena or bright red blood per rectum.  She is due to check a CBC today to monitor her hemoglobin.  Her cough has improved off the ACE inhibitor.  However she appears extremely confused as to what medication she is taking.  She is uncertain if she is taking losartan.  She is uncertain if she is taking Protonix.  Her med list seems to contradict what she believes she is taking at home.  Continues to have fatigue.  Past Medical History:  Diagnosis Date  . Acute encephalopathy 09/14/2016  . Adenomatous polyp   . Anxiety   . Aortic stenosis     . Arrhythmia   . Dementia 09/16/2016  . Diabetes ([place])   . DIVERTICULITIS OF COLON 06/14/2008   Qualifier: Diagnosis of  By: [name]    . Diverticulosis   . DM (diabetes mellitus), type 2 ([place]) 09/15/2016  . Fatty liver   . Hyperlipidemia   . Hypertension   . Irritable bowel syndrome 04/06/2008   Centricity Description: IBS Qualifier: Diagnosis of  By: [name] CMA ([name]), [name]   Centricity Description: IRRITABLE BOWEL SYNDROME Qualifier: Diagnosis of  By: [name] MD, [name]   . Liver cirrhosis secondary to NASH ([place]) 09/16/2016  . NASH (nonalcoholic steatohepatitis)   . Obesity   . SOB (shortness of breath)   . Splenomegaly 09/16/2016  . Thrombocytopenia ([place])   . Thyroid disease   . Vitamin B 12 deficiency 09/16/2016  . Vitamin D deficiency    Past Surgical History:  Procedure Laterality Date  . oopherectomy    . OVARIAN CYST REMOVAL     Current Outpatient Medications on File Prior to Visit  Medication Sig Dispense Refill  . ACCU-CHEK AVIVA PLUS test strip     . ACCU-CHEK SOFTCLIX LANCETS lancets     . atorvastatin (LIPITOR) 20 MG tablet TAKE 1 TABLET (20 MG TOTAL) BY MOUTH DAILY. 90 tablet 3  . Blood Glucose Monitoring Suppl (ACCU-CHEK AVIVA PLUS) w/Device KIT     . furosemide (LASIX) 40 MG tablet Take 1 tablet (40 mg total) by mouth 2 (two) times daily. 60 tablet 3  . glimepiride (AMARYL) 1 MG tablet TAKE 1 TABLET EVERY DAY WITH BREAKFAST 90 tablet 1  . levothyroxine (SYNTHROID, LEVOTHROID) 75 MCG tablet Take 1 tablet (75 mcg total) by mouth daily. 90 tablet 3  . losartan (COZAAR) 100 MG tablet Take 1 tablet (100 mg total) by mouth daily. Stop lisinopril hctz 90 tablet 3  . metFORMIN (GLUCOPHAGE-XR) 500 MG 24 hr tablet TAKE 2 TABLETS TWICE DAILY 360 tablet 1  . metoprolol tartrate (LOPRESSOR) 25 MG tablet TAKE 1 TABLET TWICE DAILY 180 tablet 3  . pantoprazole (PROTONIX) 40 MG tablet Take 1 tablet (40 mg total) by mouth daily. 30 tablet 3  . potassium chloride SA  (K-DUR,KLOR-CON) 20 MEQ tablet Take 1 tablet (20 mEq total) by mouth daily. 90 tablet 3  . traZODone (DESYREL) 50 MG tablet TAKE 1 TABLET AT BEDTIME AS NEEDED FOR SLEEP 90 tablet 1  . venlafaxine XR (EFFEXOR XR) 75 MG 24 hr capsule Take 1  capsule (75 mg total) by mouth daily with breakfast. 1 a day for 1 week then 2 tabs a day thereafter 60 capsule 3  . zolpidem (AMBIEN) 5 MG tablet TAKE 1 TABLET BY MOUTH EVERY DAY AT BEDTIME AS NEEDED FOR SLEEP 30 tablet 2   Current Facility-Administered Medications on File Prior to Visit  Medication Dose Route Frequency Provider Last Rate Last Dose  . cyanocobalamin ((VITAMIN B-12)) injection 1,000 mcg  1,000 mcg Subcutaneous Q30 days [name], MD   1,000 mcg at 09/08/17 1408   Allergies  Allergen Reactions  . Celecoxib   . Prednisone Itching    Heart rate increased  . Macrodantin [Nitrofurantoin Macrocrystal] Rash  . Penicillins Hives    Per family   Social History   Socioeconomic History  . Marital status: Married    Spouse name: Not on file  . Number of children: Not on file  . Years of education: Not on file  . Highest education level: Not on file  Occupational History  . Not on file  Social Needs  . Financial resource strain: Not on file  . Food insecurity:    Worry: Not on file    Inability: Not on file  . Transportation needs:    Medical: Not on file    Non-medical: Not on file  Tobacco Use  . Smoking status: Current Every Day Smoker    Packs/day: 0.50    Types: Cigarettes  . Smokeless tobacco: Never Used  Substance and Sexual Activity  . Alcohol use: No  . Drug use: No  . Sexual activity: Not Currently  Lifestyle  . Physical activity:    Days per week: Not on file    Minutes per session: Not on file  . Stress: Not on file  Relationships  . Social connections:    Talks on phone: Not on file    Gets together: Not on file    Attends religious service: Not on file    Active member of club or organization: Not on  file    Attends meetings of clubs or organizations: Not on file    Relationship status: Not on file  . Intimate partner violence:    Fear of current or ex partner: Not on file    Emotionally abused: Not on file    Physically abused: Not on file    Forced sexual activity: Not on file  Other Topics Concern  . Not on file  Social History Narrative  . Not on file   Family History  Problem Relation Age of Onset  . Lung cancer Father   . Heart disease Father        MI at age 73  . Alcohol abuse Father   . Alzheimer's disease Mother   . Dementia Mother   . Other Mother        twisted colon   . Stomach cancer Neg Hx   . Colon cancer Neg Hx       Review of Systems  Psychiatric/Behavioral: The patient has insomnia.   All other systems reviewed and are negative.      Objective:   Physical Exam  Constitutional: She is oriented to person, place, and time. She appears well-developed and well-nourished.  HENT:  Right Ear: External ear normal.  Left Ear: External ear normal.  Nose: Nose normal.  Mouth/Throat: Oropharynx is clear and moist. No oropharyngeal exudate.  Eyes: Pupils are equal, round, and reactive to light. Conjunctivae and EOM are normal.  Neck:  Neck supple. No JVD present. No thyromegaly present.  Cardiovascular: Normal rate and regular rhythm.  Murmur heard.  Systolic murmur is present with a grade of 3/6. Pulmonary/Chest: Effort normal. No respiratory distress. She has no wheezes. She has no rales.  Abdominal: Soft. Bowel sounds are normal. She exhibits no distension. There is no tenderness. There is no rebound and no guarding.  Musculoskeletal: She exhibits no edema.  Lymphadenopathy:    She has no cervical adenopathy.  Neurological: She is alert and oriented to person, place, and time. She has normal reflexes. No cranial nerve deficit. She exhibits normal muscle tone. Coordination normal.  Psychiatric: She has a normal mood and affect. Her behavior is normal.  Judgment and thought content normal.  Vitals reviewed.         Assessment & Plan:  Iron deficiency anemia, unspecified iron deficiency anemia type - Plan: Anemia panel, COMPLETE METABOLIC PANEL WITH GFR  Aortic valve stenosis, etiology of cardiac valve disease unspecified  Repeat an anemia panel to determine if the patient's anemia is improving.  I will contact her gastroenterologist and see if we can expedite her appointment any so that they can go ahead and proceed with endoscopy to determine the source of her bleeding so that we can then proceed with management of her aortic stenosis.  Her blood pressure today is extremely high.  However patient Colson not be taking her losartan.  I have asked her to go home and get all of her pill bottles and bring them back to the office so that we can go over what exactly she is taking and therefore make accurate adjustments in her medications to address her blood pressure.

## 2018-02-19 ENCOUNTER — Other Ambulatory Visit: Payer: Self-pay | Admitting: Family Medicine

## 2018-02-19 DIAGNOSIS — D509 Iron deficiency anemia, unspecified: Secondary | ICD-10-CM

## 2018-02-20 ENCOUNTER — Other Ambulatory Visit: Payer: Medicare HMO

## 2018-02-20 DIAGNOSIS — D509 Iron deficiency anemia, unspecified: Secondary | ICD-10-CM

## 2018-02-20 LAB — CBC WITH DIFFERENTIAL/PLATELET
BASOS ABS: 53 {cells}/uL (ref 0–200)
Basophils Relative: 0.7 %
EOS PCT: 3.5 %
Eosinophils Absolute: 263 cells/uL (ref 15–500)
HEMATOCRIT: 28.8 % — AB (ref 35.0–45.0)
Hemoglobin: 9 g/dL — ABNORMAL LOW (ref 11.7–15.5)
Lymphs Abs: 2235 cells/uL (ref 850–3900)
MCH: 23.6 pg — ABNORMAL LOW (ref 27.0–33.0)
MCHC: 31.3 g/dL — AB (ref 32.0–36.0)
MCV: 75.4 fL — AB (ref 80.0–100.0)
MPV: 11.2 fL (ref 7.5–12.5)
Monocytes Relative: 10 %
NEUTROS PCT: 56 %
Neutro Abs: 4200 cells/uL (ref 1500–7800)
Platelets: 90 10*3/uL — ABNORMAL LOW (ref 140–400)
RBC: 3.82 10*6/uL (ref 3.80–5.10)
RDW: 16.2 % — AB (ref 11.0–15.0)
TOTAL LYMPHOCYTE: 29.8 %
WBC: 7.5 10*3/uL (ref 3.8–10.8)
WBCMIX: 750 {cells}/uL (ref 200–950)

## 2018-02-23 ENCOUNTER — Encounter: Payer: Self-pay | Admitting: Family Medicine

## 2018-02-23 ENCOUNTER — Ambulatory Visit (INDEPENDENT_AMBULATORY_CARE_PROVIDER_SITE_OTHER): Payer: Medicare HMO | Admitting: Family Medicine

## 2018-02-23 VITALS — BP 110/60 | HR 74 | Temp 98.0°F | Resp 16 | Ht 64.5 in | Wt 211.0 lb

## 2018-02-23 DIAGNOSIS — D509 Iron deficiency anemia, unspecified: Secondary | ICD-10-CM

## 2018-02-23 LAB — IRON,TIBC AND FERRITIN PANEL
%SAT: 12 % (calc) — ABNORMAL LOW (ref 16–45)
Ferritin: 7 ng/mL — ABNORMAL LOW (ref 16–288)
IRON: 45 ug/dL (ref 45–160)
TIBC: 371 mcg/dL (calc) (ref 250–450)

## 2018-02-23 NOTE — Progress Notes (Signed)
Subjective:    Patient ID: Kelly [name], female    DOB: January 24, 1951, 67 y.o.   MRN: [id number]  Insomnia     10/2016 Patient is a new patient here today with her son to establish care. Apparently she was recently admitted to the hospital with altered mental status. She was diagnosed with benzodiazepine intoxication and underlying dementia. She is here today for follow-up. She also apparently had low B12 levels which was confirmed in the hospital and she's been taking B12 tablets. Patient states that she is always been extremely anxious to stay at home alone. Recently her husband has been in a rehabilitation facility after surgery. Therefore she was taking Xanax prescribed by her previous primary care provider in addition to Ambien which she will take at night to help her sleep. Apparently she has been on Ambien for many many years due to chronic insomnia. Her son was unable to get her to answer the door one morning and broke in the back door to find his mother extremely intoxicated and confused. She was taken to the hospital. Their CAT scan showed mild diffuse cortical thickening. Ammonia levels were normal. RPR was normal. TSH was normal. Hemoglobin A1c was 5.4 PA B12 level was low at less than 200. Otherwise workup was unremarkable aside from some thrombocytopenia which is chronic and a mild anemia presumed to be secondary to B12 deficiency and iron deficiency. Since discharge from the hospital she is not taking any further Xanax. Her son has also not allowed her to have Ambien over the last month. She is here today for evaluation. I performed a Mini-Mental status exam. The patient is easily able to score is 5 out of 5. She also easily tells me the location. She is able to remember 3 objects easily. She can spell world in reverse and perform serial sevens. However the patient does have a noticeable word finding difficulty just in general communication. She has a difficult time remembering the names of  objects and remembering what she's trying to tell me. On clock drawing assessment, the patient is able to correctly draw the clock and the time. Takes for a prolonged period of time to do this. I then performed a [place] cognitive assessment and the patient had an extremely difficult time performing the pattern test alternating between letters and numbers in order. Even after I explained to the patient she has a difficult time drawing the areas appropriately. Therefore it appears that she has some mild dementia with some mild anomia.  Patient is requesting a refill on her Ambien.  She also has a history of lymphocytic colitis previously managed by Dr. [name]. She has not seen him in quite some time and she is not taking that the desonide that he had previously prescribed. As a result she has frequent diarrhea which could be contributing to her B12 deficiency the fact of malabsorptive diarrhea. She also has underlying history of nonalcoholic steatohepatitis complicated by thrombocytopenia. Platelet counts in the hospital between 70 and 80. She has a mild anemia with hemoglobin between 11 and 12. Her diabetes thankfully was well controlled at 5.4 and her TSH was within normal limits.  At that time, my plan was: The patient's memory loss was likely multifactorial. Given the cortical thickening on CT scan and some of the findings demonstrated today on her cognitive assessment she appears to have a very mild dementia. It is difficult for me to gauge this given the fact I am just meeting the patient today. I  believe this is likely exacerbated by the combination of benzodiazepines which she [place] have taken several and her Ambien. This could also be affected by her B12 deficiency. Therefore I recommended that we treat the patient's B12 deficiency with B12 injections parenterally 1000 g every month as I'm concerned that she does not absorb B12 well to the gastrointestinal tract due to her lymphocytic colitis. I've also  recommended that she start iron replacement. I spent more than 45 minutes with this patient discussing her diagnosis and evaluating her. I strongly recommended that we keep her away from any medication that can exacerbate her memory particularly benzodiazepines and Ambien. I believe a lot of her problems are exacerbated by anxiety. Therefore I recommended neuropsychological consultation to determine medication options. Perhaps trazodone would be a better option for this patient rather than Ambien. I would like to see her back in one month to discuss and follow-up. I refilled her other medications. No lab work was obtained at this visit but I reviewed the battery of lab work that was obtained at her most recent hospitalization:  11/07/16 Patient is under tremendous stress. Her husband is battling congestive heart failure and recently underwent an amputation. She states that he is constantly unloading his burdens and complaints onto her. However he is in denial about her medical problems. She feels very little support. She continues to battle anxiety and insomnia. She reports poor concentration, depression, anhedonia. She has very little energy. In the past she is tried and failed Zoloft, Wellbutrin, Prozac," several others". She denies any suicidal ideation. She denies any hallucinations or delusions. She is wanting to try Xanax again to help her sleep. Please see the previous office visit we discussed the problems of Xanax created for this patient.  AT that time, my plan was: I spent more than 25 minutes with this patient discussing her problems. I do believe she has mild dementia but also believe she could be suffering from depression. I would like to try the patient on Trintellix 5 mg a day for 1 week and gradually increased to 10 mg a day. Recheck in one month to see if there is benefit. Try to avoid high-risk medications such as benzodiazepines. She can continue use the trazodone as needed for  sleep.  03/14/17 Patient is here today for follow-up. I had asked the patient to return to recheck a fasting lipid panel, recheck her TSH, recheck her hemoglobin A1c to monitor the management of her diabetes, hypothyroidism, and to monitor for evidence of hyperlipidemia. However the patient upon arrival tells me that her depression is worse. Spent more than 30 minutes with the patient in discussion. He does not include her in any financial decisions. He does not seem to respect her take into consideration her desires or her plans.  He also apparently had an affair for many years and has never asked for forgiveness. They have never discussed this normal had any kind of resolution regarding this. I believe that her stressors in her marriage are contributing to her depression. From the standpoint of her dementia, she has very mild memory loss. Just talking to the patient, is difficult to notice. However she stopped coming for her B12 injections and I believe is due to the fact that she forgot them. She states that she didn't and that she was confused on how often she needed to come. I recommended that she come once a month moving forward for B12 injections. Her blood pressure today is adequately controlled.  She denies  any hypoglycemia. She denies any polyuria, polydipsia, or blurred vision. She denies any tachycardia or palpitations. She denies any substantial changes in her weight.  At that time, my plan was: Blood pressure today is well controlled. I'll make no changes in her blood pressure medication. She has a history of nonalcoholic steatohepatitis. Therefore I've asked the patient to return fasting for fasting lipid panel as well as a CMP to monitor for dyslipidemia and monitor her liver function test. At that time I'll also check a TSH to monitor management of her hypothyroidism. I would also like to check a hemoglobin A1c and a urine microalbumin. If her A1c stays less than 6.5, I would like to discontinue  Amaryl due to the risk of hypoglycemia area and spent more than 30 minutes with the patient discussing her situation. Recommended marriage counseling. I believe that until they seek therapy and come to some type of understanding and forgiveness that she will harm her resentment that would contribute to her depression moving forward. I believe that she feels disrespected and a lack of love in the marriage and this is contributing to her depression.   09/08/17 Patient discontinued trintellix in November.  However she denies any depression today.  Her blood pressure slightly elevated 146/80.  She is on losartan/hydrochlorothiazide 100/12.5.  She denies any chest pain.  She does report occasional shortness of breath.  At her last visit, I obtain an echocardiogram because of her worsening murmur which confirms moderate to severe aortic stenosis.  She is scheduled to see a cardiologist later this month to discuss treatment options.  Therefore I do not want to lower preload significantly despite the elevated blood pressures because of aortic stenosis.  Patient denies syncope or near syncope or palpitations.  She is not checking her blood sugars regularly.  However she denies any hypoglycemic episodes.  She denies any polyuria, polydipsia, or blurry vision.  She denies any myalgias or right upper quadrant pain.  Recently her insurance has denied her Ambien.  She takes Ambien at night to help her sleep.  She has tried trazodone in the past without benefit.  She is also tried Belsomra in the past and this did not work as well.  Therefore she is requesting that I try to complete a prior authorization for the medication.  She is also here today to recheck her TSH.  However upon medication list review, the patient is not sure that she is actually taking levothyroxine.  At that time, my plan was: Patient has discontinued her antidepressant.  However she is doing well and I see no reason to resume medication that the patient  does not seem to require.  I would like to check a hemoglobin A1c to monitor the management of her diabetes mellitus.  If her hemoglobin A1c is significantly below 6, I would discontinue glimepiride due to the risk of hypoglycemia.  I will also monitor her liver function test regarding her history of cirrhosis secondary to [facility].  Her blood pressure slightly elevated today however I do not want to lower preload significantly because of her aortic stenosis.  I will switch her losartan HCTZ to lisinopril HCTZ 20/12.5, 2 tablets once a day due to the recent recall of losartan and difficulty finding that medication.  I will check a TSH to ensure that the patient is compliant with her levothyroxine.  Patient will go home and verify whether or not she is actually been taking the medication.  I will also check a fasting lipid  panel.  Goal LDL cholesterol is less than 100 because of the diabetes.  Regarding her aortic stenosis, she is scheduled to see the cardiologist later this month.   11/28/17 Patient feels she needs to resume her antidepressant.  She reports anhedonia, daily depression.  She feels excessive feelings of guilt.  She has crying spells.  Recently she heard her son tell his children that his parents could not care for themselves more or less watch after his kids.  This broke her heart.  She understands that she has dementia however this is only exacerbated her depression.  She also reports chronic fatigue.  She has trouble sleeping.  She has trouble concentrating.  She saw significant benefit on the antidepressant however since stopping it, the depression seems to spiral out of control.   At that time, my plan was:  Patient's depression has worsened.  Begin Effexor XR 75 mg p.o. every morning.  Increase to 150 mg in 1 week.  Reassess in 5 weeks.  Given the fact her fatigue has worsened, I will check lab work to ensure that her thyroid is adequately treated and that there has not been a decline in her  renal function, her CBC, or that her diabetes is out of control.  12/12/17 Appointment on 02/20/2018  Component Date Value Ref Range Status  . WBC 02/20/2018 7.5  3.8 - 10.8 Thousand/uL Final  . RBC 02/20/2018 3.82  3.80 - 5.10 Million/uL Final  . Hemoglobin 02/20/2018 9.0* 11.7 - 15.5 g/dL Final  . HCT 02/20/2018 28.8* 35.0 - 45.0 % Final  . MCV 02/20/2018 75.4* 80.0 - 100.0 fL Final  . MCH 02/20/2018 23.6* 27.0 - 33.0 pg Final  . MCHC 02/20/2018 31.3* 32.0 - 36.0 g/dL Final  . RDW 02/20/2018 16.2* 11.0 - 15.0 % Final  . Platelets 02/20/2018 90* 140 - 400 Thousand/uL Final  . MPV 02/20/2018 11.2  7.5 - 12.5 fL Final  . Neutro Abs 02/20/2018 4,200  1,500 - 7,800 cells/uL Final  . Lymphs Abs 02/20/2018 2,235  850 - 3,900 cells/uL Final  . WBC mixed population 02/20/2018 750  200 - 950 cells/uL Final  . Eosinophils Absolute 02/20/2018 263  15 - 500 cells/uL Final  . Basophils Absolute 02/20/2018 53  0 - 200 cells/uL Final  . Neutrophils Relative % 02/20/2018 56  % Final  . Total Lymphocyte 02/20/2018 29.8  % Final  . Monocytes Relative 02/20/2018 10.0  % Final  . Eosinophils Relative 02/20/2018 3.5  % Final  . Basophils Relative 02/20/2018 0.7  % Final  Office Visit on 02/17/2018  Component Date Value Ref Range Status  . Glucose, Bld 02/17/2018 157* 65 - 99 mg/dL Final   Comment: .            Fasting reference interval . For someone without known diabetes, a glucose value >125 mg/dL indicates that they Xue have diabetes and this should be confirmed with a follow-up test. .   . BUN 02/17/2018 9  7 - 25 mg/dL Final  . Creat 02/17/2018 0.89  0.50 - 0.99 mg/dL Final   Comment: For patients >47 years of age, the reference limit for Creatinine is approximately 13% higher for people identified as African-American. .   . GFR, Est Non African American 02/17/2018 66  > OR = 60 mL/min/1.64m Final  . GFR, Est African American 02/17/2018 77  > OR = 60 mL/min/1.7108mFinal  .  BUN/Creatinine Ratio 0762/69/4854OT APPLICABLE  6 - 22 (calc) Final  .  Sodium 02/17/2018 142  135 - 146 mmol/L Final  . Potassium 02/17/2018 3.6  3.5 - 5.3 mmol/L Final  . Chloride 02/17/2018 107  98 - 110 mmol/L Final  . CO2 02/17/2018 25  20 - 32 mmol/L Final  . Calcium 02/17/2018 9.4  8.6 - 10.4 mg/dL Final  . Total Protein 02/17/2018 6.4  6.1 - 8.1 g/dL Final  . Albumin 02/17/2018 3.6  3.6 - 5.1 g/dL Final  . Globulin 02/17/2018 2.8  1.9 - 3.7 g/dL (calc) Final  . AG Ratio 02/17/2018 1.3  1.0 - 2.5 (calc) Final  . Total Bilirubin 02/17/2018 0.6  0.2 - 1.2 mg/dL Final  . Alkaline phosphatase (APISO) 02/17/2018 94  33 - 130 U/L Final  . AST 02/17/2018 18  10 - 35 U/L Final  . ALT 02/17/2018 10  6 - 29 U/L Final   As shown above, the patient was found to have significant iron deficiency anemia likely contributing to her fatigue.  Her stools were also positive for blood.  She has been referred to GI for evaluation of her iron deficiency anemia and guaiac positive stool.  Today she is here complaining of fatigue despite taking iron.  She is complaining of a chronic cough that has been going on for many weeks.  She has been battling a cough off and on since December.  Cough is nonproductive but does seem to be worse at night.  She is also having some right upper quadrant abdominal pain that comes and goes.  It is mild.  There are no exacerbating or alleviating factors.  She is afebrile.  She denies any melena or hematochezia.  She denies any hemoptysis.  At that time, my plan was:  Aortic valve stenosis, etiology of cardiac valve disease unspecified I believe her fatigue is multifactorial and likely related primarily to her severe iron deficiency anemia.  Patient is taking iron.  I explained to the patient will take at least 1 month for her blood counts to build back.  I would like to repeat a CBC in 1 month on the iron to see if she is improving.  Also I am not certain why her stool is guaiac  positive.  I explained to the patient that we need her to see GI for endoscopy to rule out a bleeding ulcer or other source of GI blood loss that [place] be explaining her right upper quadrant discomfort.  Given her cough, I would proceed with a chest x-ray however I believe her cough Pospisil be either due to acid reflux given its exacerbation while lying supine, or possibly due to her ACE inhibitor.  I will have the patient discontinue lisinopril HCTZ and replace it with losartan 100 mg a day.  I have also recommended that she start taking Prilosec 20 mg a day over-the-counter and see if her symptoms improve.  If right-sided abdominal pain does not improve, consider work-up of biliary tract disease/cholelithiasis.  12/19/17 The patient's cough is getting worse.  Occasionally is productive of gray sputum but usually is dry.  She reports increasing shortness of breath.  She also reports significant weight gain over the last few days despite not changing her diet.  She reports increasing dyspnea on exertion.  She reports orthopnea.  She also reports paroxysmal nocturnal dyspnea.  Oxygen saturation is down to 93% on room air exam is significant for bibasilar crackles and a loud 3/6 to 4/6 systolic ejection murmur heard best over the aortic valve.  Patient has known moderate  to severe aortic stenosis.  My concern is now that her cough Benner be a sign of pulmonary edema despite having a normal chest x-ray last week particular given her sudden weight gain and increasing shortness of breath.  She denies any fevers.  She does occasionally have wheezing.  She adamantly denies chest pain or syncope.  At that time, my plan was: Patient's cough Mose in fact be multifactorial however her exam today significant for sudden weight gain, 3/6 to 4/6 systolic ejection murmur, bibasilar crackles, orthopnea, and a pulse oximetry of 93% on room air.  All these findings are concerning for acute pulmonary edema secondary to decreased systolic  function or possibly her aortic stenosis.  I will send the patient immediately for a chest x-ray to rule out bilateral pneumonia.  If chest x-ray confirms pulmonary edema, I will start the patient on Lasix 40 mg twice daily and K-Dur 20 mEq p.o. daily and recheck the patient on Monday with express instructions to go to the hospital if getting worse.  Obviously of chest x-ray shows pneumonia, this plan will change in the patient will likely be placed on Levaquin.  12/23/17 Chest x-ray revealed no significant abnormalities aside from cardiomegaly which is my unofficial read.  Patient used Lasix as prescribed over the weekend 40 mg twice daily.  Her breathing has improved dramatically.  She has diuresed almost 12 pounds!  She denies any lightheadedness.  She denies any dry mouth.  She denies any dizziness however I am concerned about dehydration given the significant weight loss.  Her pulse oximetry has improved dramatically and she states that her breathing is back to her baseline.  The nagging cough that she experienced is also improving however I am not convinced that that is due to pulmonary edema but rather could be due to the ACE inhibitor that we recently stopped.  She is due also to recheck her anemia that was discovered in April.  She has been taking iron now for 1 month.  Hemoglobin was 12 in February.  It dropped to 9.5 on April 26.  She has been on iron ever since that time.  She is due to recheck it today.  She has an appointment to see gastroenterology as her FIT test on Tom 3 was positive for blood. Wt Readings from Last 3 Encounters:  02/23/18 211 lb (95.7 kg)  02/17/18 210 lb (95.3 kg)  02/09/18 204 lb 12.8 oz (92.9 kg)  At that time, my plan was: Clinically her breathing is much better.  She has diuresed 12 pounds over the weekend.  Obviously I am now concerned about dehydration and prerenal azotemia as this was more than I was anticipating.  I have recommended decreasing Lasix from 40 mg  twice daily to 20 mg once daily and discontinuing potassium altogether.  I will recheck a BMP today to monitor electrolytes and renal function test.  I will also repeat a CBC to evaluate for improvement of her anemia on iron replacement after 1 month.  She has an appointment to see gastroenterology given her positive stool test for blood to evaluate for GI sources of blood loss.  Given her recent episode of what appears to be pulmonary edema causing shortness of breath, I will see if I can expedite her appointment to see her cardiologist as I am concerned that her pulmonary edema Myszka be due to to her worsening aortic stenosis.  Her cough is also improving.  I believe the ACE inhibitor was playing a role  in this.  Continue losartan and discontinue ACE inhibitor permanently  01/02/18 After the patient lost significant weight with diuresis, I decreased her Lasix to 20 mg a day to avoid dehydration and prerenal azotemia.  She is back to her usual weight which is 212 pounds.  She continues to state that her shortness of breath is better however she states that her cough has returned.  Some days she will cough constantly.  Other day she will cough only when she lays down at night to sleep.  Now the cough is nonproductive.  On exam, her pulmonary exam today is normal.  She is off her ACE inhibitor.  Her pulmonary edema has been treated aggressively with persistence of the cough.  However she is not on any medication to treat laryngo-esophageal reflux.  She denies any heartburn.  She recently saw GI who recommended waiting until after her cardiology evaluation in 1 month prior to determining/proceeding with an EGD or colonoscopy due to her iron deficiency anemia and positive stool test.  At that time, my plan was: Patient states her shortness of breath is better however she continues to have a chronic constant cough.  I will add Protonix 40 mg a day for possible acid reflux/laryngo-esophageal reflux as a cause of her  cough.  If cough continues and persist beyond this after 2 to 3 weeks, I would recommend pulmonology consultation.  Meanwhile await the results of her echocardiogram and cardiology follow-up about her aortic valve stenosis.  X-ray was reassuring and did not show severe pulmonary edema.  Anemia is stable and the patient has follow-up scheduled with GI after she meets with the cardiologist in 1 month.  Patient can resume Ambien 5 mg a day as needed for insomnia  02/17/18 Patient met with her cardiologist.  Cardiology is concerned that her anemia presents a more pressing urgent matter and that the source of her GI bleed needs to be determined prior to any surgical intervention to correct her underlying aortic stenosis.  Therefore they have deferred any intervention until follow-up in 3 months.  Patient has an appointment to see the gastroenterologist August 6.  She denies seeing any melena or bright red blood per rectum.  She is due to check a CBC today to monitor her hemoglobin.  Her cough has improved off the ACE inhibitor.  However she appears extremely confused as to what medication she is taking.  She is uncertain if she is taking losartan.  She is uncertain if she is taking Protonix.  Her med list seems to contradict what she believes she is taking at home.  Continues to have fatigue.  At that time, my plan was: Repeat an anemia panel to determine if the patient's anemia is improving.  I will contact her gastroenterologist and see if we can expedite her appointment any so that they can go ahead and proceed with endoscopy to determine the source of her bleeding so that we can then proceed with management of her aortic stenosis.  Her blood pressure today is extremely high.  However patient [place] not be taking her losartan.  I have asked her to go home and get all of her pill bottles and bring them back to the office so that we can go over what exactly she is taking and therefore make accurate adjustments in her  medications to address her blood pressure.  02/23/18 Patient's hemoglobin had not improved.  Instead it had fallen even further from 9.7 initially to 9.0 now.  She continues  to have microcytic anemia suggesting that she is not absorbing the iron sulfate which she is taking twice a day.  Her RDW however is elevated raising the concern for possible blood loss as well.  She is here today to discuss this further.  There was no earlier appointment with the gastroenterologist other than August 6.  At the present time, she is planning to see the gastroenterologist August 6 to discuss endoscopy  Past Medical History:  Diagnosis Date  . Acute encephalopathy 09/14/2016  . Adenomatous polyp   . Anxiety   . Aortic stenosis   . Arrhythmia   . Dementia 09/16/2016  . Diabetes ([place])   . DIVERTICULITIS OF COLON 06/14/2008   Qualifier: Diagnosis of  By: [name]    . Diverticulosis   . DM (diabetes mellitus), type 2 ([place]) 09/15/2016  . Fatty liver   . Hyperlipidemia   . Hypertension   . Irritable bowel syndrome 04/06/2008   Centricity Description: IBS Qualifier: Diagnosis of  By: [name] CMA ([name]), [name]   Centricity Description: IRRITABLE BOWEL SYNDROME Qualifier: Diagnosis of  By: [name] MD, [name]   . Liver cirrhosis secondary to NASH ([place]) 09/16/2016  . NASH (nonalcoholic steatohepatitis)   . Obesity   . SOB (shortness of breath)   . Splenomegaly 09/16/2016  . Thrombocytopenia ([place])   . Thyroid disease   . Vitamin B 12 deficiency 09/16/2016  . Vitamin D deficiency    Past Surgical History:  Procedure Laterality Date  . oopherectomy    . OVARIAN CYST REMOVAL     Current Outpatient Medications on File Prior to Visit  Medication Sig Dispense Refill  . ACCU-CHEK AVIVA PLUS test strip     . ACCU-CHEK SOFTCLIX LANCETS lancets     . atorvastatin (LIPITOR) 20 MG tablet TAKE 1 TABLET (20 MG TOTAL) BY MOUTH DAILY. 90 tablet 3  . Blood Glucose Monitoring Suppl (ACCU-CHEK AVIVA PLUS) w/Device KIT      . furosemide (LASIX) 40 MG tablet Take 1 tablet (40 mg total) by mouth 2 (two) times daily. 60 tablet 3  . glimepiride (AMARYL) 1 MG tablet TAKE 1 TABLET EVERY DAY WITH BREAKFAST 90 tablet 1  . levothyroxine (SYNTHROID, LEVOTHROID) 75 MCG tablet Take 1 tablet (75 mcg total) by mouth daily. 90 tablet 3  . losartan (COZAAR) 100 MG tablet Take 1 tablet (100 mg total) by mouth daily. Stop lisinopril hctz 90 tablet 3  . metFORMIN (GLUCOPHAGE-XR) 500 MG 24 hr tablet TAKE 2 TABLETS TWICE DAILY 360 tablet 1  . metoprolol tartrate (LOPRESSOR) 25 MG tablet TAKE 1 TABLET TWICE DAILY 180 tablet 3  . pantoprazole (PROTONIX) 40 MG tablet Take 1 tablet (40 mg total) by mouth daily. 30 tablet 3  . potassium chloride SA (K-DUR,KLOR-CON) 20 MEQ tablet Take 1 tablet (20 mEq total) by mouth daily. 90 tablet 3  . traZODone (DESYREL) 50 MG tablet TAKE 1 TABLET AT BEDTIME AS NEEDED FOR SLEEP 90 tablet 1  . venlafaxine XR (EFFEXOR XR) 75 MG 24 hr capsule Take 1 capsule (75 mg total) by mouth daily with breakfast. 1 a day for 1 week then 2 tabs a day thereafter 60 capsule 3  . zolpidem (AMBIEN) 5 MG tablet TAKE 1 TABLET BY MOUTH EVERY DAY AT BEDTIME AS NEEDED FOR SLEEP 30 tablet 2   Current Facility-Administered Medications on File Prior to Visit  Medication Dose Route Frequency Provider Last Rate Last Dose  . cyanocobalamin ((VITAMIN B-12)) injection 1,000 mcg  1,000 mcg  Subcutaneous Q30 days [name], MD   1,000 mcg at 09/08/17 1408   Allergies  Allergen Reactions  . Celecoxib   . Prednisone Itching    Heart rate increased  . Macrodantin [Nitrofurantoin Macrocrystal] Rash  . Penicillins Hives    Per family   Social History   Socioeconomic History  . Marital status: Married    Spouse name: Not on file  . Number of children: Not on file  . Years of education: Not on file  . Highest education level: Not on file  Occupational History  . Not on file  Social Needs  . Financial resource strain: Not  on file  . Food insecurity:    Worry: Not on file    Inability: Not on file  . Transportation needs:    Medical: Not on file    Non-medical: Not on file  Tobacco Use  . Smoking status: Current Every Day Smoker    Packs/day: 0.50    Types: Cigarettes  . Smokeless tobacco: Never Used  Substance and Sexual Activity  . Alcohol use: No  . Drug use: No  . Sexual activity: Not Currently  Lifestyle  . Physical activity:    Days per week: Not on file    Minutes per session: Not on file  . Stress: Not on file  Relationships  . Social connections:    Talks on phone: Not on file    Gets together: Not on file    Attends religious service: Not on file    Active member of club or organization: Not on file    Attends meetings of clubs or organizations: Not on file    Relationship status: Not on file  . Intimate partner violence:    Fear of current or ex partner: Not on file    Emotionally abused: Not on file    Physically abused: Not on file    Forced sexual activity: Not on file  Other Topics Concern  . Not on file  Social History Narrative  . Not on file   Family History  Problem Relation Age of Onset  . Lung cancer Father   . Heart disease Father        MI at age 25  . Alcohol abuse Father   . Alzheimer's disease Mother   . Dementia Mother   . Other Mother        twisted colon   . Stomach cancer Neg Hx   . Colon cancer Neg Hx       Review of Systems  Psychiatric/Behavioral: The patient has insomnia.   All other systems reviewed and are negative.      Objective:   Physical Exam  Constitutional: She is oriented to person, place, and time. She appears well-developed and well-nourished.  HENT:  Right Ear: External ear normal.  Left Ear: External ear normal.  Nose: Nose normal.  Mouth/Throat: Oropharynx is clear and moist. No oropharyngeal exudate.  Eyes: Pupils are equal, round, and reactive to light. Conjunctivae and EOM are normal.  Neck: Neck supple. No JVD  present. No thyromegaly present.  Cardiovascular: Normal rate and regular rhythm.  Murmur heard.  Systolic murmur is present with a grade of 3/6. Pulmonary/Chest: Effort normal. No respiratory distress. She has no wheezes. She has no rales.  Abdominal: Soft. Bowel sounds are normal. She exhibits no distension. There is no tenderness. There is no rebound and no guarding.  Musculoskeletal: She exhibits no edema.  Lymphadenopathy:    She has  no cervical adenopathy.  Neurological: She is alert and oriented to person, place, and time. She has normal reflexes. No cranial nerve deficit. She exhibits normal muscle tone. Coordination normal.  Psychiatric: She has a normal mood and affect. Her behavior is normal. Judgment and thought content normal.  Vitals reviewed.         Assessment & Plan:  Iron deficiency anemia, unspecified iron deficiency anemia type - Plan: Iron, TIBC and Ferritin Panel  I will check an iron level, total iron binding capacity, and a ferritin.  If the patient's iron level is low and his percent saturation binding capacity is elevated, it would suggest that the patient is not absorbing iron and that she [place] benefit from IV iron.  Therefore if this lab work confirms this, I would recommend a hematology consultation to arrange for IV iron while we are waiting on the GI consultation for endoscopy.  Ultimately, we need to correct the patient's anemia so that she can have her aortic valve repaired if necessary.  Per the patient's report she believes this most likely is going to happen at her follow-up in October.  Therefore I want to arrange that hematology consultation to schedule for IV iron as soon as possible if necessary in order to prepare the patient for her upcoming surgery particularly if GI finds no source of blood loss.

## 2018-02-24 ENCOUNTER — Other Ambulatory Visit: Payer: Self-pay | Admitting: Family Medicine

## 2018-02-24 DIAGNOSIS — D500 Iron deficiency anemia secondary to blood loss (chronic): Secondary | ICD-10-CM

## 2018-02-24 DIAGNOSIS — F330 Major depressive disorder, recurrent, mild: Secondary | ICD-10-CM

## 2018-02-24 NOTE — Telephone Encounter (Signed)
Ok to refill??  Last office visit 02/23/2018.  Last refill 12/02/2017, #2 refills.

## 2018-02-25 ENCOUNTER — Encounter: Payer: Self-pay | Admitting: Oncology

## 2018-02-25 ENCOUNTER — Telehealth: Payer: Self-pay | Admitting: Oncology

## 2018-02-25 NOTE — Telephone Encounter (Signed)
New hematology referral received from Dr. [name] for dx of IDA. Pt has been scheduled to see Dr. Alen Blew on 8/2 at 215pm. Pt aware to arrive 30 minutes early. Letter mailed.

## 2018-03-06 ENCOUNTER — Inpatient Hospital Stay: Payer: Medicare HMO | Attending: Oncology | Admitting: Oncology

## 2018-03-06 ENCOUNTER — Telehealth: Payer: Self-pay

## 2018-03-06 VITALS — BP 134/56 | HR 62 | Temp 98.3°F | Resp 17 | Ht 64.5 in | Wt 210.0 lb

## 2018-03-06 DIAGNOSIS — D696 Thrombocytopenia, unspecified: Secondary | ICD-10-CM | POA: Diagnosis not present

## 2018-03-06 DIAGNOSIS — K921 Melena: Secondary | ICD-10-CM | POA: Diagnosis not present

## 2018-03-06 DIAGNOSIS — D509 Iron deficiency anemia, unspecified: Secondary | ICD-10-CM | POA: Insufficient documentation

## 2018-03-06 NOTE — Progress Notes (Signed)
Reason for the request: Iron deficiency anemia.  HPI: I was asked by Dr. [name]  to evaluate Kelly [name] for anemia.  She is a 67 year old woman with history of diabetes, hyperlipidemia and hypertension.  She was noted to have iron deficiency back in April 2019.  At that time her iron level was 39 and ferritin of 9.  Her iron saturation was at 10%.  Her hemoglobin was 9.5 with an MCV of 78.  She was started on oral iron replacement and repeat laboratory testing in July 2019 continue to show persistent anemia.  Her hemoglobin was 9.0, MCV of 75 with RDW of 16.2.  Her iron studies showed ferritin to be declining at 7 with saturation of 12% and iron level of 45.  She is scheduled to have cardiac surgery for valve replacement October 2019.  She had a colonoscopy in the past and does not report any hematochezia or melena.  She did have a Hemoccult testing that was positive.  She is seeing Dr. [name] next week for evaluation for possible endoscopy.  She does report some fatigue and tiredness associated with her anemia but no dyspnea on exertion.  She denies any hemoptysis or hematemesis.  She does not report any headaches, blurry vision, syncope or seizures. Does not report any fevers, chills or sweats.  Does not report any cough, wheezing or hemoptysis.  Does not report any chest pain, palpitation, orthopnea or leg edema.  Does not report any nausea, vomiting or abdominal pain.  Does not report any constipation or diarrhea.  Does not report any skeletal complaints.    Does not report frequency, urgency or hematuria.  Does not report any skin rashes or lesions. Does not report any heat or cold intolerance.  Does not report any lymphadenopathy or petechiae.  Does not report any anxiety or depression.  Remaining review of systems is negative.    Past Medical History:  Diagnosis Date  . Acute encephalopathy 09/14/2016  . Adenomatous polyp   . Anxiety   . Aortic stenosis   . Arrhythmia   . Dementia 09/16/2016  .  Diabetes ([place])   . DIVERTICULITIS OF COLON 06/14/2008   Qualifier: Diagnosis of  By: [name]    . Diverticulosis   . DM (diabetes mellitus), type 2 ([place]) 09/15/2016  . Fatty liver   . Hyperlipidemia   . Hypertension   . Irritable bowel syndrome 04/06/2008   Centricity Description: IBS Qualifier: Diagnosis of  By: [name] CMA ([name]), [name]   Centricity Description: IRRITABLE BOWEL SYNDROME Qualifier: Diagnosis of  By: [name] MD, [name]   . Liver cirrhosis secondary to NASH ([place]) 09/16/2016  . NASH (nonalcoholic steatohepatitis)   . Obesity   . SOB (shortness of breath)   . Splenomegaly 09/16/2016  . Thrombocytopenia ([place])   . Thyroid disease   . Vitamin B 12 deficiency 09/16/2016  . Vitamin D deficiency   :  Past Surgical History:  Procedure Laterality Date  . oopherectomy    . OVARIAN CYST REMOVAL    :   Current Outpatient Medications:  .  ACCU-CHEK AVIVA PLUS test strip, , Disp: , Rfl:  .  ACCU-CHEK SOFTCLIX LANCETS lancets, , Disp: , Rfl:  .  atorvastatin (LIPITOR) 20 MG tablet, TAKE 1 TABLET (20 MG TOTAL) BY MOUTH DAILY., Disp: 90 tablet, Rfl: 3 .  Blood Glucose Monitoring Suppl (ACCU-CHEK AVIVA PLUS) w/Device KIT, , Disp: , Rfl:  .  furosemide (LASIX) 40 MG tablet, Take 1 tablet (40 mg total)  by mouth 2 (two) times daily., Disp: 60 tablet, Rfl: 3 .  glimepiride (AMARYL) 1 MG tablet, TAKE 1 TABLET EVERY DAY WITH BREAKFAST, Disp: 90 tablet, Rfl: 1 .  levothyroxine (SYNTHROID, LEVOTHROID) 75 MCG tablet, Take 1 tablet (75 mcg total) by mouth daily., Disp: 90 tablet, Rfl: 3 .  losartan (COZAAR) 100 MG tablet, Take 1 tablet (100 mg total) by mouth daily. Stop lisinopril hctz, Disp: 90 tablet, Rfl: 3 .  metFORMIN (GLUCOPHAGE-XR) 500 MG 24 hr tablet, TAKE 2 TABLETS TWICE DAILY, Disp: 360 tablet, Rfl: 1 .  metoprolol tartrate (LOPRESSOR) 25 MG tablet, TAKE 1 TABLET TWICE DAILY, Disp: 180 tablet, Rfl: 3 .  pantoprazole (PROTONIX) 40 MG tablet, Take 1 tablet (40 mg total) by mouth  daily., Disp: 30 tablet, Rfl: 3 .  potassium chloride SA (K-DUR,KLOR-CON) 20 MEQ tablet, Take 1 tablet (20 mEq total) by mouth daily., Disp: 90 tablet, Rfl: 3 .  traZODone (DESYREL) 50 MG tablet, TAKE 1 TABLET AT BEDTIME AS NEEDED FOR SLEEP, Disp: 90 tablet, Rfl: 1 .  venlafaxine XR (EFFEXOR XR) 75 MG 24 hr capsule, Take 1 capsule (75 mg total) by mouth daily with breakfast. 1 a day for 1 week then 2 tabs a day thereafter, Disp: 60 capsule, Rfl: 3 .  zolpidem (AMBIEN) 5 MG tablet, TAKE 1 TABLET BY MOUTH EVERY DAY AT BEDTIME AS NEEDED FOR SLEEP, Disp: 30 tablet, Rfl: 2  Current Facility-Administered Medications:  .  cyanocobalamin ((VITAMIN B-12)) injection 1,000 mcg, 1,000 mcg, Subcutaneous, Q30 days, [name], MD, 1,000 mcg at 09/08/17 1408:  Allergies  Allergen Reactions  . Celecoxib   . Prednisone Itching    Heart rate increased  . Macrodantin [Nitrofurantoin Macrocrystal] Rash  . Penicillins Hives    Per family  :  Family History  Problem Relation Age of Onset  . Lung cancer Father   . Heart disease Father        MI at age 41  . Alcohol abuse Father   . Alzheimer's disease Mother   . Dementia Mother   . Other Mother        twisted colon   . Stomach cancer Neg Hx   . Colon cancer Neg Hx   :  Social History   Socioeconomic History  . Marital status: Married    Spouse name: Not on file  . Number of children: Not on file  . Years of education: Not on file  . Highest education level: Not on file  Occupational History  . Not on file  Social Needs  . Financial resource strain: Not on file  . Food insecurity:    Worry: Not on file    Inability: Not on file  . Transportation needs:    Medical: Not on file    Non-medical: Not on file  Tobacco Use  . Smoking status: Current Every Day Smoker    Packs/day: 0.50    Types: Cigarettes  . Smokeless tobacco: Never Used  Substance and Sexual Activity  . Alcohol use: No  . Drug use: No  . Sexual activity: Not  Currently  Lifestyle  . Physical activity:    Days per week: Not on file    Minutes per session: Not on file  . Stress: Not on file  Relationships  . Social connections:    Talks on phone: Not on file    Gets together: Not on file    Attends religious service: Not on file    Active member of  club or organization: Not on file    Attends meetings of clubs or organizations: Not on file    Relationship status: Not on file  . Intimate partner violence:    Fear of current or ex partner: Not on file    Emotionally abused: Not on file    Physically abused: Not on file    Forced sexual activity: Not on file  Other Topics Concern  . Not on file  Social History Narrative  . Not on file  :  Pertinent items are noted in HPI.  Exam: Blood pressure (!) 134/56, pulse 62, temperature 98.3 F (36.8 C), temperature source Oral, resp. rate 17, height 5' 4.5" (1.638 m), weight 210 lb (95.3 kg), SpO2 99 %.   ECOG 1 General appearance: alert and cooperative appeared without distress. Head: atraumatic without any abnormalities. Eyes: conjunctivae/corneas clear. PERRL.  Sclera anicteric. Throat: lips, mucosa, and tongue normal; without oral thrush or ulcers. Resp: clear to auscultation bilaterally without rhonchi, wheezes or dullness to percussion. Cardio: regular rate and rhythm, S1, S2 normal, no murmur, click, rub or gallop GI: soft, non-tender; bowel sounds normal; no masses,  no organomegaly Skin: Skin color, texture, turgor normal. No rashes or lesions Lymph nodes: Cervical, supraclavicular, and axillary nodes normal. Neurologic: Grossly normal without any motor, sensory or deep tendon reflexes. Musculoskeletal: No joint deformity or effusion.  CBC    Component Value Date/Time   WBC 7.5 02/20/2018 1105   RBC 3.82 02/20/2018 1105   HGB 9.0 (L) 02/20/2018 1105   HCT 28.8 (L) 02/20/2018 1105   HCT 32.4 (L) 09/15/2016 0442   PLT 90 (L) 02/20/2018 1105   MCV 75.4 (L) 02/20/2018 1105    MCH 23.6 (L) 02/20/2018 1105   MCHC 31.3 (L) 02/20/2018 1105   RDW 16.2 (H) 02/20/2018 1105   LYMPHSABS 2,235 02/20/2018 1105   MONOABS 0.8 12/18/2016 1015   EOSABS 263 02/20/2018 1105   BASOSABS 53 02/20/2018 1105     Chemistry      Component Value Date/Time   NA 142 02/17/2018 1134   K 3.6 02/17/2018 1134   CL 107 02/17/2018 1134   CO2 25 02/17/2018 1134   BUN 9 02/17/2018 1134   CREATININE 0.89 02/17/2018 1134      Component Value Date/Time   CALCIUM 9.4 02/17/2018 1134   ALKPHOS 88 03/26/2017 0908   AST 18 02/17/2018 1134   ALT 10 02/17/2018 1134   BILITOT 0.6 02/17/2018 1134     Results for LINDER, PRAJAPATI (MRN [id number]) as of 03/06/2018 13:28  Ref. Range 09/15/2016 08:38 11/28/2017 14:57 02/23/2018 11:59  Iron Latest Ref Range: 45 - 160 mcg/dL 61 39 (L) 45  UIBC Latest Units: ug/dL 292    TIBC Latest Ref Range: 250 - 450 mcg/dL (calc) 353 395 371  %SAT Latest Ref Range: 16 - 45 % (calc)  10 (L) 12 (L)  Saturation Ratios Latest Ref Range: 10.4 - 31.8 % 17    Ferritin Latest Ref Range: 16 - 288 ng/mL 26 9 (L) 7 (L)  Folate Latest Ref Range: >5.9 ng/mL 15.6        Assessment and Plan:   67 year old woman with the following:  1.  Iron deficiency anemia diagnosed in April 2019.  At that time her iron level was 39 with ferritin of 9 and hemoglobin of 9.  Despite oral iron replacement her ferritin continues to decline with decreased saturation and hemoglobin that has declined from 9.7-9.0 while on iron.  The differential diagnosis  of her iron deficiency was discussed today.  Chronic blood losses from GI or GU sources is a possibility.  Inability to absorb oral iron could also compound this issue related to her medication or a protein-losing enteropathy.  From a management standpoint, I recommended proceeding with intravenous iron given her poor improvement of iron levels with oral therapy.  Risks and benefits of Feraheme infusion were reviewed.  Complications that include  arthralgias, myalgias and infusion related complications were discussed.  Infusion related issues can be severe and rarely anaphylaxis has been reported.  I anticipate she will need 510 mg of intravenous iron on 2 separate treatments in the near future.  After discussion today, she is agreeable to proceed and will have that completed in the near future.  I will repeat iron studies in 4 months to check her progress and ensure stability.  2.  Hemoccult positive stool: I encouraged her to undergo GI work-up and she has appointment with Dr. [name] in the near future.   3.  Thrombocytopenia: Her platelet count fluctuated between normal range and mild decrease.  On 02/20/2018 they were noted at 90,000.  No active bleeding noted without any ecchymosis or petechia.  These findings could be related to cirrhosis of the liver that was noted on ultrasound findings.  4.  Follow-up: We will be in 4 months to repeat laboratory testing and iron studies after intravenous iron infusion in the near future.  30  minutes was spent with the patient face-to-face today.  More than 50% of time was dedicated to discussing the differential diagnosis and treatment options as well as alternatives.  Thank you for the referral.  A copy of this consult has been forwarded to the requesting physician.

## 2018-03-06 NOTE — Telephone Encounter (Signed)
PRINTED AVS AND CALENDER OF UPCOMING APPOINTMENT. PER 8/1 LOS

## 2018-03-09 ENCOUNTER — Ambulatory Visit (HOSPITAL_COMMUNITY)
Admission: RE | Admit: 2018-03-09 | Discharge: 2018-03-09 | Disposition: A | Discharge status: Home / Self Care | Payer: Medicare HMO | Source: Ambulatory Visit | Attending: Oncology | Admitting: Oncology

## 2018-03-09 DIAGNOSIS — D509 Iron deficiency anemia, unspecified: Secondary | ICD-10-CM | POA: Diagnosis not present

## 2018-03-09 MED ORDER — SODIUM CHLORIDE 0.9 % IV SOLN
INTRAVENOUS | Status: DC
  Administered 2018-03-09: 12:00:00 via INTRAVENOUS

## 2018-03-09 MED ORDER — SODIUM CHLORIDE 0.9 % IV SOLN
510.0000 mg | Freq: Once | INTRAVENOUS | Status: AC
  Administered 2018-03-09: 510 mg via INTRAVENOUS
  Filled 2018-03-09: qty 17

## 2018-03-09 NOTE — Progress Notes (Signed)
PATIENT CARE CENTER NOTE  Diagnosis: Iron Deficiency Anemia    Provider: Dr. Alen Blew   Procedure: IV Feraheme    Note: Patient received Feraheme infusion. Patient tolerated infusion well with no adverse reaction. Monitored patient for 30 minutes post infusion. Vital signs taken and stable. Discharge instructions given. Patient alert, oriented and ambulatory at discharge.

## 2018-03-09 NOTE — Discharge Instructions (Signed)
Ferumoxytol injection What is this medicine? FERUMOXYTOL is an iron complex. Iron is used to make healthy red blood cells, which carry oxygen and nutrients throughout the body. This medicine is used to treat iron deficiency anemia in people with chronic kidney disease. This medicine Farias be used for other purposes; ask your health care provider or pharmacist if you have questions. COMMON BRAND NAME(S): Feraheme What should I tell my health care provider before I take this medicine? They need to know if you have any of these conditions: -anemia not caused by low iron levels -high levels of iron in the blood -magnetic resonance imaging (MRI) test scheduled -an unusual or allergic reaction to iron, other medicines, foods, dyes, or preservatives -pregnant or trying to get pregnant -breast-feeding How should I use this medicine? This medicine is for injection into a vein. It is given by a health care professional in a hospital or clinic setting. Talk to your pediatrician regarding the use of this medicine in children. Special care [place] be needed. Overdosage: If you think you have taken too much of this medicine contact a poison control center or emergency room at once. NOTE: This medicine is only for you. Do not share this medicine with others. What if I miss a dose? It is important not to miss your dose. Call your doctor or health care professional if you are unable to keep an appointment. What [place] interact with this medicine? This medicine [place] interact with the following medications: -other iron products This list [place] not describe all possible interactions. Give your health care provider a list of all the medicines, herbs, non-prescription drugs, or dietary supplements you use. Also tell them if you smoke, drink alcohol, or use illegal drugs. Some items [place] interact with your medicine. What should I watch for while using this medicine? Visit your doctor or healthcare professional regularly. Tell  your doctor or healthcare professional if your symptoms do not start to get better or if they get worse. You Mcghie need blood work done while you are taking this medicine. You [place] need to follow a special diet. Talk to your doctor. Foods that contain iron include: whole grains/cereals, dried fruits, beans, or peas, leafy green vegetables, and organ meats (liver, kidney). What side effects P[place]ins I notice from receiving this medicine? Side effects that you should report to your doctor or health care professional as soon as possible: -allergic reactions like skin rash, itching or hives, swelling of the face, lips, or tongue -breathing problems -changes in blood pressure -feeling faint or lightheaded, falls -fever or chills -flushing, sweating, or hot feelings -swelling of the ankles or feet Side effects that usually do not require medical attention (report to your doctor or health care professional if they continue or are bothersome): -diarrhea -headache -nausea, vomiting -stomach pain This list Mcquain not describe all possible side effects. Call your doctor for medical advice about side effects. You Ardito report side effects to FDA at 1-800-FDA-1088. Where should I keep my medicine? This drug is given in a hospital or clinic and will not be stored at home. NOTE: This sheet is a summary. It D[place] not cover all possible information. If you have questions about this medicine, talk to your doctor, pharmacist, or health care provider.  2018 Elsevier/Gold Standard (2015-08-24 12:41:49)  

## 2018-03-10 ENCOUNTER — Encounter: Payer: Self-pay | Admitting: Gastroenterology

## 2018-03-10 ENCOUNTER — Other Ambulatory Visit (INDEPENDENT_AMBULATORY_CARE_PROVIDER_SITE_OTHER): Payer: Medicare HMO

## 2018-03-10 ENCOUNTER — Ambulatory Visit (INDEPENDENT_AMBULATORY_CARE_PROVIDER_SITE_OTHER): Payer: Medicare HMO | Admitting: Gastroenterology

## 2018-03-10 VITALS — BP 120/60 | HR 64 | Ht 64.5 in | Wt 213.0 lb

## 2018-03-10 DIAGNOSIS — I8500 Esophageal varices without bleeding: Secondary | ICD-10-CM | POA: Diagnosis not present

## 2018-03-10 DIAGNOSIS — D649 Anemia, unspecified: Secondary | ICD-10-CM | POA: Diagnosis not present

## 2018-03-10 DIAGNOSIS — R195 Other fecal abnormalities: Secondary | ICD-10-CM | POA: Diagnosis not present

## 2018-03-10 DIAGNOSIS — D509 Iron deficiency anemia, unspecified: Secondary | ICD-10-CM

## 2018-03-10 LAB — COMPREHENSIVE METABOLIC PANEL
ALBUMIN: 3.7 g/dL (ref 3.5–5.2)
ALT: 8 U/L (ref 0–35)
AST: 14 U/L (ref 0–37)
Alkaline Phosphatase: 73 U/L (ref 39–117)
BILIRUBIN TOTAL: 0.5 mg/dL (ref 0.2–1.2)
BUN: 13 mg/dL (ref 6–23)
CO2: 28 mEq/L (ref 19–32)
CREATININE: 0.97 mg/dL (ref 0.40–1.20)
Calcium: 9.7 mg/dL (ref 8.4–10.5)
Chloride: 107 mEq/L (ref 96–112)
GFR: 60.45 mL/min (ref >60.00)
Glucose, Bld: 107 mg/dL — ABNORMAL HIGH (ref 70–99)
POTASSIUM: 3.7 meq/L (ref 3.5–5.1)
SODIUM: 142 meq/L (ref 135–145)
Total Protein: 6.6 g/dL (ref 6.0–8.3)

## 2018-03-10 LAB — CBC WITH DIFFERENTIAL/PLATELET
BASOS ABS: 0.1 10*3/uL (ref 0.0–0.1)
Basophils Relative: 1 % (ref 0.0–3.0)
EOS ABS: 0.2 10*3/uL (ref 0.0–0.7)
Eosinophils Relative: 3.8 % (ref 0.0–5.0)
HCT: 26.6 % — ABNORMAL LOW (ref 36.0–46.0)
Lymphocytes Relative: 27.1 % (ref 12.0–46.0)
Lymphs Abs: 1.7 10*3/uL (ref 0.7–4.0)
MCHC: 32.5 g/dL (ref 30.0–36.0)
MCV: 75.5 fl — ABNORMAL LOW (ref 78.0–100.0)
MONO ABS: 0.7 10*3/uL (ref 0.1–1.0)
Monocytes Relative: 11.4 % (ref 3.0–12.0)
NEUTROS PCT: 56.7 % (ref 43.0–77.0)
Neutro Abs: 3.5 10*3/uL (ref 1.4–7.7)
Platelets: 73 10*3/uL — ABNORMAL LOW (ref 150.0–400.0)
RBC: 3.52 Mil/uL — AB (ref 3.87–5.11)
RDW: 18.2 % — ABNORMAL HIGH (ref 11.5–15.5)
WBC: 6.2 10*3/uL (ref 4.0–10.5)

## 2018-03-10 LAB — PROTIME-INR
INR: 1.1 ratio — ABNORMAL HIGH (ref 0.8–1.0)
Prothrombin Time: 13.3 s — ABNORMAL HIGH (ref 9.6–13.1)

## 2018-03-10 MED ORDER — PEG 3350-KCL-NA BICARB-NACL 420 G PO SOLR: 4000.0000 mL | ORAL | 0 refills | Status: DC

## 2018-03-10 NOTE — Patient Instructions (Addendum)
You will have labs checked today in the basement lab.  Please head down after you check out with the front desk  (cbc, cmet, inr, AFP) You will be set up for an upper endoscopy to screen for esophageal varices, also check for cause of FOBT + anemia. Same time colonoscopy for FOBT + anemia.  Procedures to be done at [facility] since you have moderate to severe aortic stenosis. Please return to see Dr. [name] in 4 months.   It is important that you have a relatively low salt diet.  High salt diet can cause fluid to accumulate in your legs, abdomen and even around your lungs. You should try to avoid NSAID type over the counter pain medicines as best as possible. Tylenol is safe to take for 'routine' aches and pains, but never take more than 1/2 the dose suggested on the package instructions (never more than 2 grams per day). Avoid alcohol.  Normal BMI (Body Mass Index- based on height and weight) is between 23 and 30. Your BMI today is Body mass index is 36 kg/m. Marland Kitchen Please consider follow up  regarding your BMI with your Primary Care Provider.

## 2018-03-10 NOTE — H&P (View-Only) (Signed)
Review of gastrointestinal problems:  1. small tubular adenoma in colon,removed September, 2009. Next colonoscopy September 2014  2. left-sided diverticulosiswith diverticular associated edema noted by colonoscopy 2009  3. abnormal liver tests noted July, 2010: Likely from NASH Blood work August, 2010: Hepatitis A., B., C. were all negative; alpha one antitrypsin level normal, ceruloplasmin level normal, AMA negative, AMA negative, iron studies normal, anti-smooth muscle antibody negative, AST and ALT in 50-70 range; TTG negative, IgA level normal. Liver ultrasound suggested fatty liver disease.  [place] 01/2018 "mild cirrhosis" without mass lesion ([place] 09/2016 "suspected cirrhosis, no focal hepatic lesion, + splenomegaly")  MELD ? (no INR since 2018. 4. Change in bowels, minor bleeding; Colonoscopy [place] 2014: The colonic mucosa was slightly erythematous throughout. Biopsies were taken randomly and sent to pathology. There were multiple diverticulum in the left colon. The terminal ileum was normal. No polyps or cancers. Pathology suggested lymphocytic colitis.Started on budesonide 3 pills daily.  HPI: This is a very pleasant 67 year old woman whom I personally last saw at least 2 or 3 years ago.  She has been here in the office twice in the past year and has seen Robinson.  Discussions mainly around her iron deficiency anemia, Hemoccult positive stool and cirrhosis.  Her husband and friend are here in the room today. She has had no overt GI bleeding.  She has no troubles with edema   She recently saw her cardiologist about her moderate to severe aortic stenosis and he recommended eventual therapeutic treatment for but not until after further GI testing to better understand her anemia, Hemoccult positive stool and cirrhosis.    Chief complaint is anemia, Hemoccult-positive stool, cirrhosis  ROS: complete GI ROS as described in HPI, all other review negative.  Constitutional:  No unintentional  weight loss   Past Medical History:  Diagnosis Date  . Acute encephalopathy 09/14/2016  . Adenomatous polyp   . Anxiety   . Aortic stenosis   . Arrhythmia   . Dementia 09/16/2016  . Diabetes ([place])   . DIVERTICULITIS OF COLON 06/14/2008   Qualifier: Diagnosis of  By: [name]    . Diverticulosis   . DM (diabetes mellitus), type 2 ([place]) 09/15/2016  . Fatty liver   . Hyperlipidemia   . Hypertension   . Irritable bowel syndrome 04/06/2008   Centricity Description: IBS Qualifier: Diagnosis of  By: [name] CMA ([name]), [name]   Centricity Description: IRRITABLE BOWEL SYNDROME Qualifier: Diagnosis of  By: [name] MD, [name]   . Liver cirrhosis secondary to NASH ([place]) 09/16/2016  . NASH (nonalcoholic steatohepatitis)   . Obesity   . SOB (shortness of breath)   . Splenomegaly 09/16/2016  . Thrombocytopenia ([place])   . Thyroid disease   . Vitamin B 12 deficiency 09/16/2016  . Vitamin D deficiency     Past Surgical History:  Procedure Laterality Date  . oopherectomy    . OVARIAN CYST REMOVAL      Current Outpatient Medications  Medication Sig Dispense Refill  . ACCU-CHEK AVIVA PLUS test strip     . ACCU-CHEK SOFTCLIX LANCETS lancets     . atorvastatin (LIPITOR) 20 MG tablet TAKE 1 TABLET (20 MG TOTAL) BY MOUTH DAILY. 90 tablet 3  . Blood Glucose Monitoring Suppl (ACCU-CHEK AVIVA PLUS) w/Device KIT     . furosemide (LASIX) 40 MG tablet Take 1 tablet (40 mg total) by mouth 2 (two) times daily. 60 tablet 3  . levothyroxine (SYNTHROID, LEVOTHROID) 75 MCG tablet Take 1 tablet (75 mcg  total) by mouth daily. 90 tablet 3  . losartan (COZAAR) 100 MG tablet Take 1 tablet (100 mg total) by mouth daily. Stop lisinopril hctz 90 tablet 3  . metFORMIN (GLUCOPHAGE-XR) 500 MG 24 hr tablet TAKE 2 TABLETS TWICE DAILY 360 tablet 1  . metoprolol tartrate (LOPRESSOR) 25 MG tablet TAKE 1 TABLET TWICE DAILY 180 tablet 3  . pantoprazole (PROTONIX) 40 MG tablet Take 1 tablet (40 mg total) by mouth daily. 30  tablet 3  . potassium chloride SA (K-DUR,KLOR-CON) 20 MEQ tablet Take 1 tablet (20 mEq total) by mouth daily. 90 tablet 3  . traZODone (DESYREL) 50 MG tablet TAKE 1 TABLET AT BEDTIME AS NEEDED FOR SLEEP 90 tablet 1  . venlafaxine XR (EFFEXOR XR) 75 MG 24 hr capsule Take 1 capsule (75 mg total) by mouth daily with breakfast. 1 a day for 1 week then 2 tabs a day thereafter 60 capsule 3  . zolpidem (AMBIEN) 5 MG tablet TAKE 1 TABLET BY MOUTH EVERY DAY AT BEDTIME AS NEEDED FOR SLEEP 30 tablet 2   Current Facility-Administered Medications  Medication Dose Route Frequency Provider Last Rate Last Dose  . cyanocobalamin ((VITAMIN B-12)) injection 1,000 mcg  1,000 mcg Subcutaneous Q30 days [name], MD   1,000 mcg at 09/08/17 1408    Allergies as of 03/10/2018 - Review Complete 03/10/2018  Allergen Reaction Noted  . Celecoxib    . Prednisone Itching 01/13/2013  . Macrodantin [nitrofurantoin macrocrystal] Rash 06/03/2016  . Penicillins Hives 06/03/2016    Family History  Problem Relation Age of Onset  . Lung cancer Father   . Heart disease Father        MI at age 68  . Alcohol abuse Father   . Alzheimer's disease Mother   . Dementia Mother   . Other Mother        twisted colon   . Stomach cancer Neg Hx   . Colon cancer Neg Hx     Social History   Socioeconomic History  . Marital status: Married    Spouse name: Not on file  . Number of children: Not on file  . Years of education: Not on file  . Highest education level: Not on file  Occupational History  . Not on file  Social Needs  . Financial resource strain: Not on file  . Food insecurity:    Worry: Not on file    Inability: Not on file  . Transportation needs:    Medical: Not on file    Non-medical: Not on file  Tobacco Use  . Smoking status: Current Every Day Smoker    Packs/day: 0.50    Types: Cigarettes  . Smokeless tobacco: Never Used  Substance and Sexual Activity  . Alcohol use: No  . Drug use: No  .  Sexual activity: Not Currently  Lifestyle  . Physical activity:    Days per week: Not on file    Minutes per session: Not on file  . Stress: Not on file  Relationships  . Social connections:    Talks on phone: Not on file    Gets together: Not on file    Attends religious service: Not on file    Active member of club or organization: Not on file    Attends meetings of clubs or organizations: Not on file    Relationship status: Not on file  . Intimate partner violence:    Fear of current or ex partner: Not on file  Emotionally abused: Not on file    Physically abused: Not on file    Forced sexual activity: Not on file  Other Topics Concern  . Not on file  Social History Narrative  . Not on file     Physical Exam: BP 120/60   Pulse 64   Ht 5' 4.5" (1.638 m)   Wt 213 lb (96.6 kg)   BMI 36.00 kg/m  Constitutional: chronically ill appearing Psychiatric: alert and oriented x3 Abdomen: soft, nontender, nondistended, no obvious ascites, no peritoneal signs, normal bowel sounds No peripheral edema noted in lower extremities  Assessment and plan: 67 y.o. female with anemia, Hemoccult positive stool, cirrhosis, moderate to severe aortic stenosis   first, we had a nice discussion about cirrhosis and I think that she and her husband have a much better understanding of the process at this point.  I believe her cirrhosis is probably from long-standing fatty liver disease.  She seems to have well compensated cirrhosis however she has not had an INR checked in over a year to properly score her with meld scoring.  She certainly has no sign of ascites on ultrasound, examination and no lower extremity edema.  She does have slightly low platelets at 90,000 which is probably a sign of underlying portal hypertension, splenomegaly.  I recommended EGD and colonoscopy to work-up her iron deficiency anemia, Hemoccult positive stool and also to screen her for esophageal varices.  Given her moderate to  severe aortic stenosis these would be safest to be done at the hospital.  I am going to restage her liver disease with CBC, complete med about profile and coags today.  She will at least get back to see me in the office in 4 months and then probably semiannually after that as long as her disease is under good control.  Please see the "Patient Instructions" section for addition details about the plan.  Owens Loffler, MD [facility] 03/10/2018, 3:13 PM

## 2018-03-10 NOTE — Progress Notes (Signed)
Review of gastrointestinal problems:  1. small tubular adenoma in colon,removed September, 2009. Next colonoscopy September 2014  2. left-sided diverticulosiswith diverticular associated edema noted by colonoscopy 2009  3. abnormal liver tests noted July, 2010: Likely from NASH Blood work August, 2010: Hepatitis A., B., C. were all negative; alpha one antitrypsin level normal, ceruloplasmin level normal, AMA negative, AMA negative, iron studies normal, anti-smooth muscle antibody negative, AST and ALT in 50-70 range; TTG negative, IgA level normal. Liver ultrasound suggested fatty liver disease.  [place] 01/2018 "mild cirrhosis" without mass lesion ([place] 09/2016 "suspected cirrhosis, no focal hepatic lesion, + splenomegaly")  MELD ? (no INR since 2018. 4. Change in bowels, minor bleeding; Colonoscopy [place] 2014: The colonic mucosa was slightly erythematous throughout. Biopsies were taken randomly and sent to pathology. There were multiple diverticulum in the left colon. The terminal ileum was normal. No polyps or cancers. Pathology suggested lymphocytic colitis.Started on budesonide 3 pills daily.  HPI: This is a very pleasant 67 year old woman whom I personally last saw at least 2 or 3 years ago.  She has been here in the office twice in the past year and has seen Robinson.  Discussions mainly around her iron deficiency anemia, Hemoccult positive stool and cirrhosis.  Her husband and friend are here in the room today. She has had no overt GI bleeding.  She has no troubles with edema   She recently saw her cardiologist about her moderate to severe aortic stenosis and he recommended eventual therapeutic treatment for but not until after further GI testing to better understand her anemia, Hemoccult positive stool and cirrhosis.    Chief complaint is anemia, Hemoccult-positive stool, cirrhosis  ROS: complete GI ROS as described in HPI, all other review negative.  Constitutional:  No unintentional  weight loss   Past Medical History:  Diagnosis Date  . Acute encephalopathy 09/14/2016  . Adenomatous polyp   . Anxiety   . Aortic stenosis   . Arrhythmia   . Dementia 09/16/2016  . Diabetes ([place])   . DIVERTICULITIS OF COLON 06/14/2008   Qualifier: Diagnosis of  By: [name]    . Diverticulosis   . DM (diabetes mellitus), type 2 ([place]) 09/15/2016  . Fatty liver   . Hyperlipidemia   . Hypertension   . Irritable bowel syndrome 04/06/2008   Centricity Description: IBS Qualifier: Diagnosis of  By: [name] CMA ([name]), [name]   Centricity Description: IRRITABLE BOWEL SYNDROME Qualifier: Diagnosis of  By: [name] MD, [name]   . Liver cirrhosis secondary to NASH ([place]) 09/16/2016  . NASH (nonalcoholic steatohepatitis)   . Obesity   . SOB (shortness of breath)   . Splenomegaly 09/16/2016  . Thrombocytopenia ([place])   . Thyroid disease   . Vitamin B 12 deficiency 09/16/2016  . Vitamin D deficiency     Past Surgical History:  Procedure Laterality Date  . oopherectomy    . OVARIAN CYST REMOVAL      Current Outpatient Medications  Medication Sig Dispense Refill  . ACCU-CHEK AVIVA PLUS test strip     . ACCU-CHEK SOFTCLIX LANCETS lancets     . atorvastatin (LIPITOR) 20 MG tablet TAKE 1 TABLET (20 MG TOTAL) BY MOUTH DAILY. 90 tablet 3  . Blood Glucose Monitoring Suppl (ACCU-CHEK AVIVA PLUS) w/Device KIT     . furosemide (LASIX) 40 MG tablet Take 1 tablet (40 mg total) by mouth 2 (two) times daily. 60 tablet 3  . levothyroxine (SYNTHROID, LEVOTHROID) 75 MCG tablet Take 1 tablet (75 mcg  total) by mouth daily. 90 tablet 3  . losartan (COZAAR) 100 MG tablet Take 1 tablet (100 mg total) by mouth daily. Stop lisinopril hctz 90 tablet 3  . metFORMIN (GLUCOPHAGE-XR) 500 MG 24 hr tablet TAKE 2 TABLETS TWICE DAILY 360 tablet 1  . metoprolol tartrate (LOPRESSOR) 25 MG tablet TAKE 1 TABLET TWICE DAILY 180 tablet 3  . pantoprazole (PROTONIX) 40 MG tablet Take 1 tablet (40 mg total) by mouth daily. 30  tablet 3  . potassium chloride SA (K-DUR,KLOR-CON) 20 MEQ tablet Take 1 tablet (20 mEq total) by mouth daily. 90 tablet 3  . traZODone (DESYREL) 50 MG tablet TAKE 1 TABLET AT BEDTIME AS NEEDED FOR SLEEP 90 tablet 1  . venlafaxine XR (EFFEXOR XR) 75 MG 24 hr capsule Take 1 capsule (75 mg total) by mouth daily with breakfast. 1 a day for 1 week then 2 tabs a day thereafter 60 capsule 3  . zolpidem (AMBIEN) 5 MG tablet TAKE 1 TABLET BY MOUTH EVERY DAY AT BEDTIME AS NEEDED FOR SLEEP 30 tablet 2   Current Facility-Administered Medications  Medication Dose Route Frequency Provider Last Rate Last Dose  . cyanocobalamin ((VITAMIN B-12)) injection 1,000 mcg  1,000 mcg Subcutaneous Q30 days [name], MD   1,000 mcg at 09/08/17 1408    Allergies as of 03/10/2018 - Review Complete 03/10/2018  Allergen Reaction Noted  . Celecoxib    . Prednisone Itching 01/13/2013  . Macrodantin [nitrofurantoin macrocrystal] Rash 06/03/2016  . Penicillins Hives 06/03/2016    Family History  Problem Relation Age of Onset  . Lung cancer Father   . Heart disease Father        MI at age 68  . Alcohol abuse Father   . Alzheimer's disease Mother   . Dementia Mother   . Other Mother        twisted colon   . Stomach cancer Neg Hx   . Colon cancer Neg Hx     Social History   Socioeconomic History  . Marital status: Married    Spouse name: Not on file  . Number of children: Not on file  . Years of education: Not on file  . Highest education level: Not on file  Occupational History  . Not on file  Social Needs  . Financial resource strain: Not on file  . Food insecurity:    Worry: Not on file    Inability: Not on file  . Transportation needs:    Medical: Not on file    Non-medical: Not on file  Tobacco Use  . Smoking status: Current Every Day Smoker    Packs/day: 0.50    Types: Cigarettes  . Smokeless tobacco: Never Used  Substance and Sexual Activity  . Alcohol use: No  . Drug use: No  .  Sexual activity: Not Currently  Lifestyle  . Physical activity:    Days per week: Not on file    Minutes per session: Not on file  . Stress: Not on file  Relationships  . Social connections:    Talks on phone: Not on file    Gets together: Not on file    Attends religious service: Not on file    Active member of club or organization: Not on file    Attends meetings of clubs or organizations: Not on file    Relationship status: Not on file  . Intimate partner violence:    Fear of current or ex partner: Not on file  Emotionally abused: Not on file    Physically abused: Not on file    Forced sexual activity: Not on file  Other Topics Concern  . Not on file  Social History Narrative  . Not on file     Physical Exam: BP 120/60   Pulse 64   Ht 5' 4.5" (1.638 m)   Wt 213 lb (96.6 kg)   BMI 36.00 kg/m  Constitutional: chronically ill appearing Psychiatric: alert and oriented x3 Abdomen: soft, nontender, nondistended, no obvious ascites, no peritoneal signs, normal bowel sounds No peripheral edema noted in lower extremities  Assessment and plan: 67 y.o. female with anemia, Hemoccult positive stool, cirrhosis, moderate to severe aortic stenosis   first, we had a nice discussion about cirrhosis and I think that she and her husband have a much better understanding of the process at this point.  I believe her cirrhosis is probably from long-standing fatty liver disease.  She seems to have well compensated cirrhosis however she has not had an INR checked in over a year to properly score her with meld scoring.  She certainly has no sign of ascites on ultrasound, examination and no lower extremity edema.  She does have slightly low platelets at 90,000 which is probably a sign of underlying portal hypertension, splenomegaly.  I recommended EGD and colonoscopy to work-up her iron deficiency anemia, Hemoccult positive stool and also to screen her for esophageal varices.  Given her moderate to  severe aortic stenosis these would be safest to be done at the hospital.  I am going to restage her liver disease with CBC, complete med about profile and coags today.  She will at least get back to see me in the office in 4 months and then probably semiannually after that as long as her disease is under good control.  Please see the "Patient Instructions" section for addition details about the plan.  Owens Loffler, MD [facility] 03/10/2018, 3:13 PM

## 2018-03-11 LAB — AFP TUMOR MARKER: AFP TUMOR MARKER: 5.4 ng/mL

## 2018-03-13 ENCOUNTER — Other Ambulatory Visit: Payer: Self-pay | Admitting: Family Medicine

## 2018-03-13 MED ORDER — FUROSEMIDE 20 MG PO TABS: 20.0000 mg | ORAL_TABLET | Freq: Every day | ORAL | 3 refills | Status: DC

## 2018-03-13 MED ORDER — LOSARTAN POTASSIUM 100 MG PO TABS: 100.0000 mg | ORAL_TABLET | Freq: Every day | ORAL | 3 refills | Status: DC

## 2018-03-16 ENCOUNTER — Ambulatory Visit (HOSPITAL_COMMUNITY)
Admission: RE | Admit: 2018-03-16 | Discharge: 2018-03-16 | Disposition: A | Discharge status: Home / Self Care | Payer: Medicare HMO | Source: Ambulatory Visit | Attending: Gastroenterology | Admitting: Gastroenterology

## 2018-03-16 DIAGNOSIS — D509 Iron deficiency anemia, unspecified: Secondary | ICD-10-CM | POA: Diagnosis not present

## 2018-03-16 MED ORDER — SODIUM CHLORIDE 0.9 % IV SOLN
510.0000 mg | Freq: Once | INTRAVENOUS | Status: AC
  Administered 2018-03-16: 510 mg via INTRAVENOUS
  Filled 2018-03-16: qty 17

## 2018-03-16 MED ORDER — SODIUM CHLORIDE 0.9 % IV SOLN
INTRAVENOUS | Status: DC
  Administered 2018-03-16: 12:00:00 via INTRAVENOUS

## 2018-03-16 NOTE — Discharge Instructions (Signed)
Ferumoxytol injection What is this medicine? FERUMOXYTOL is an iron complex. Iron is used to make healthy red blood cells, which carry oxygen and nutrients throughout the body. This medicine is used to treat iron deficiency anemia in people with chronic kidney disease. This medicine Pinkett be used for other purposes; ask your health care provider or pharmacist if you have questions. COMMON BRAND NAME(S): Feraheme What should I tell my health care provider before I take this medicine? They need to know if you have any of these conditions: -anemia not caused by low iron levels -high levels of iron in the blood -magnetic resonance imaging (MRI) test scheduled -an unusual or allergic reaction to iron, other medicines, foods, dyes, or preservatives -pregnant or trying to get pregnant -breast-feeding How should I use this medicine? This medicine is for injection into a vein. It is given by a health care professional in a hospital or clinic setting. Talk to your pediatrician regarding the use of this medicine in children. Special care Maslanka be needed. Overdosage: If you think you have taken too much of this medicine contact a poison control center or emergency room at once. NOTE: This medicine is only for you. Do not share this medicine with others. What if I miss a dose? It is important not to miss your dose. Call your doctor or health care professional if you are unable to keep an appointment. What Eder interact with this medicine? This medicine Wertheim interact with the following medications: -other iron products This list Mol not describe all possible interactions. Give your health care provider a list of all the medicines, herbs, non-prescription drugs, or dietary supplements you use. Also tell them if you smoke, drink alcohol, or use illegal drugs. Some items Carbajal interact with your medicine. What should I watch for while using this medicine? Visit your doctor or healthcare professional regularly. Tell  your doctor or healthcare professional if your symptoms do not start to get better or if they get worse. You Vernon need blood work done while you are taking this medicine. You Oien need to follow a special diet. Talk to your doctor. Foods that contain iron include: whole grains/cereals, dried fruits, beans, or peas, leafy green vegetables, and organ meats (liver, kidney). What side effects Clagett I notice from receiving this medicine? Side effects that you should report to your doctor or health care professional as soon as possible: -allergic reactions like skin rash, itching or hives, swelling of the face, lips, or tongue -breathing problems -changes in blood pressure -feeling faint or lightheaded, falls -fever or chills -flushing, sweating, or hot feelings -swelling of the ankles or feet Side effects that usually do not require medical attention (report to your doctor or health care professional if they continue or are bothersome): -diarrhea -headache -nausea, vomiting -stomach pain This list Dowe not describe all possible side effects. Call your doctor for medical advice about side effects. You Cavanah report side effects to FDA at 1-800-FDA-1088. Where should I keep my medicine? This drug is given in a hospital or clinic and will not be stored at home. NOTE: This sheet is a summary. It Bogan not cover all possible information. If you have questions about this medicine, talk to your doctor, pharmacist, or health care provider.  2018 Elsevier/Gold Standard (2015-08-24 12:41:49)  

## 2018-03-16 NOTE — Progress Notes (Signed)
Patient received Feraheme via PIV. Observed for at least 30 minutes post infusion. Tolerated well, Patient did not take BP medication  before coming today. BP was 173/53 post infusion, Patient's provider notified. Patient told to take BP as soon she gets home discharge instructions given, verbalized understanding. Patient alert, oriented and ambulatory at the time of discharge.

## 2018-04-09 ENCOUNTER — Ambulatory Visit (HOSPITAL_COMMUNITY)
Admission: RE | Admit: 2018-04-09 | Discharge: 2018-04-09 | Disposition: A | Discharge status: Home / Self Care | Payer: Medicare HMO | Source: Ambulatory Visit | Attending: Gastroenterology | Admitting: Gastroenterology

## 2018-04-09 ENCOUNTER — Encounter (HOSPITAL_COMMUNITY)
Admission: RE | Disposition: A | Discharge status: Home / Self Care | Payer: Self-pay | Source: Ambulatory Visit | Attending: Gastroenterology

## 2018-04-09 ENCOUNTER — Ambulatory Visit (HOSPITAL_COMMUNITY): Payer: Medicare HMO | Admitting: Anesthesiology

## 2018-04-09 ENCOUNTER — Telehealth: Payer: Self-pay

## 2018-04-09 ENCOUNTER — Encounter (HOSPITAL_COMMUNITY): Payer: Self-pay | Admitting: *Deleted

## 2018-04-09 ENCOUNTER — Other Ambulatory Visit: Payer: Self-pay

## 2018-04-09 DIAGNOSIS — I350 Nonrheumatic aortic (valve) stenosis: Secondary | ICD-10-CM | POA: Insufficient documentation

## 2018-04-09 DIAGNOSIS — I8510 Secondary esophageal varices without bleeding: Secondary | ICD-10-CM | POA: Insufficient documentation

## 2018-04-09 DIAGNOSIS — K766 Portal hypertension: Secondary | ICD-10-CM

## 2018-04-09 DIAGNOSIS — Z6836 Body mass index (BMI) 36.0-36.9, adult: Secondary | ICD-10-CM | POA: Insufficient documentation

## 2018-04-09 DIAGNOSIS — E785 Hyperlipidemia, unspecified: Secondary | ICD-10-CM | POA: Insufficient documentation

## 2018-04-09 DIAGNOSIS — D124 Benign neoplasm of descending colon: Secondary | ICD-10-CM | POA: Insufficient documentation

## 2018-04-09 DIAGNOSIS — K7460 Unspecified cirrhosis of liver: Secondary | ICD-10-CM | POA: Insufficient documentation

## 2018-04-09 DIAGNOSIS — E079 Disorder of thyroid, unspecified: Secondary | ICD-10-CM | POA: Insufficient documentation

## 2018-04-09 DIAGNOSIS — D696 Thrombocytopenia, unspecified: Secondary | ICD-10-CM | POA: Insufficient documentation

## 2018-04-09 DIAGNOSIS — E119 Type 2 diabetes mellitus without complications: Secondary | ICD-10-CM | POA: Insufficient documentation

## 2018-04-09 DIAGNOSIS — K5730 Diverticulosis of large intestine without perforation or abscess without bleeding: Secondary | ICD-10-CM | POA: Diagnosis not present

## 2018-04-09 DIAGNOSIS — Z79899 Other long term (current) drug therapy: Secondary | ICD-10-CM | POA: Insufficient documentation

## 2018-04-09 DIAGNOSIS — F419 Anxiety disorder, unspecified: Secondary | ICD-10-CM | POA: Diagnosis not present

## 2018-04-09 DIAGNOSIS — F17210 Nicotine dependence, cigarettes, uncomplicated: Secondary | ICD-10-CM | POA: Insufficient documentation

## 2018-04-09 DIAGNOSIS — K319 Disease of stomach and duodenum, unspecified: Secondary | ICD-10-CM | POA: Diagnosis not present

## 2018-04-09 DIAGNOSIS — I10 Essential (primary) hypertension: Secondary | ICD-10-CM | POA: Diagnosis not present

## 2018-04-09 DIAGNOSIS — K6389 Other specified diseases of intestine: Secondary | ICD-10-CM | POA: Diagnosis not present

## 2018-04-09 DIAGNOSIS — Z7984 Long term (current) use of oral hypoglycemic drugs: Secondary | ICD-10-CM | POA: Diagnosis not present

## 2018-04-09 DIAGNOSIS — E669 Obesity, unspecified: Secondary | ICD-10-CM | POA: Diagnosis not present

## 2018-04-09 DIAGNOSIS — D509 Iron deficiency anemia, unspecified: Secondary | ICD-10-CM | POA: Diagnosis not present

## 2018-04-09 DIAGNOSIS — K7581 Nonalcoholic steatohepatitis (NASH): Secondary | ICD-10-CM | POA: Insufficient documentation

## 2018-04-09 DIAGNOSIS — I8500 Esophageal varices without bleeding: Secondary | ICD-10-CM

## 2018-04-09 DIAGNOSIS — K3189 Other diseases of stomach and duodenum: Secondary | ICD-10-CM | POA: Diagnosis not present

## 2018-04-09 DIAGNOSIS — D123 Benign neoplasm of transverse colon: Secondary | ICD-10-CM

## 2018-04-09 DIAGNOSIS — K635 Polyp of colon: Secondary | ICD-10-CM

## 2018-04-09 DIAGNOSIS — Z79890 Hormone replacement therapy (postmenopausal): Secondary | ICD-10-CM | POA: Diagnosis not present

## 2018-04-09 HISTORY — PX: COLONOSCOPY WITH PROPOFOL: SHX5780

## 2018-04-09 HISTORY — PX: ESOPHAGOGASTRODUODENOSCOPY (EGD) WITH PROPOFOL: SHX5813

## 2018-04-09 HISTORY — PX: POLYPECTOMY: SHX5525

## 2018-04-09 LAB — GLUCOSE, CAPILLARY: Glucose-Capillary: 129 mg/dL — ABNORMAL HIGH (ref 70–99)

## 2018-04-09 SURGERY — ESOPHAGOGASTRODUODENOSCOPY (EGD) WITH PROPOFOL
Anesthesia: Monitor Anesthesia Care

## 2018-04-09 MED ORDER — PROPOFOL 10 MG/ML IV BOLUS
INTRAVENOUS | Status: AC
  Filled 2018-04-09: qty 40

## 2018-04-09 MED ORDER — PROPOFOL 10 MG/ML IV BOLUS
INTRAVENOUS | Status: DC | PRN
  Administered 2018-04-09: 30 mg via INTRAVENOUS
  Administered 2018-04-09: 20 mg via INTRAVENOUS

## 2018-04-09 MED ORDER — PROPOFOL 10 MG/ML IV BOLUS
INTRAVENOUS | Status: AC
  Filled 2018-04-09: qty 20

## 2018-04-09 MED ORDER — LIDOCAINE 2% (20 MG/ML) 5 ML SYRINGE
INTRAMUSCULAR | Status: DC | PRN
  Administered 2018-04-09: 100 mg via INTRAVENOUS

## 2018-04-09 MED ORDER — PROPOFOL 500 MG/50ML IV EMUL
INTRAVENOUS | Status: DC | PRN
  Administered 2018-04-09: 125 ug/kg/min via INTRAVENOUS

## 2018-04-09 MED ORDER — FERROUS SULFATE 325 (65 FE) MG PO TABS: 325.0000 mg | ORAL_TABLET | Freq: Every day | ORAL | 3 refills | Status: DC

## 2018-04-09 MED ORDER — SODIUM CHLORIDE 0.9 % IV SOLN: INTRAVENOUS | Status: DC

## 2018-04-09 MED ORDER — LACTATED RINGERS IV SOLN
INTRAVENOUS | Status: DC
  Administered 2018-04-09: 1000 mL via INTRAVENOUS

## 2018-04-09 SURGICAL SUPPLY — 25 items

## 2018-04-09 NOTE — Telephone Encounter (Signed)
-----   Message from [name], MD sent at 04/09/2018 11:52 AM EDT ----- Gerald Stabs, Just completed colonoscopy and EGD for her. 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 She Muscarella also have small bowel AVMs that can ooze periodically.  She has esophageal varices as well and needs primary prophylaxis to decrease risk of variceal bleeding. Ideally I'd like to start her on non-selective BB blocker (usually I'll start with nadolol 69m daily and titrate upwards till HR is around 60). She's already on lopressor.  Any chance we can stop the lopressor and start nadolol in it's place??    Will also start daily iron supplement orally, hopefull mitigate the slow GI blood loss.  Thanks   Thaila Bottoms, She needs cbc in 2 weeks, rov with me in 6-7 weeks.    thanks

## 2018-04-09 NOTE — Anesthesia Preprocedure Evaluation (Signed)
Anesthesia Evaluation  Patient identified by MRN, date of birth, ID band Patient awake    Reviewed: Allergy & Precautions, NPO status , Patient's Chart, lab work & pertinent test results, reviewed documented beta blocker date and time   Airway Mallampati: II  TM Distance: >3 FB Neck ROM: Full    Dental  (+) Dental Advisory Given   Pulmonary neg pulmonary ROS, Current Smoker,    Pulmonary exam normal breath sounds clear to auscultation       Cardiovascular hypertension, Pt. on home beta blockers and Pt. on medications Normal cardiovascular exam+ Valvular Problems/Murmurs AS  Rhythm:Regular Rate:Normal  Echo 01/2018 - Left ventricle: The cavity size was mildly dilated. Wall   thickness was increased in a pattern of mild LVH. Systolic function was normal. The estimated ejection fraction was in the range of 60% to 65%. Wall motion was normal; there were no  regional wall motion abnormalities. Doppler parameters are  consistent with abnormal left ventricular relaxation (grade 1 diastolic dysfunction). Doppler parameters are consistent with high ventricular filling pressure. - Aortic valve: Valve mobility was restricted. There was moderate to severe stenosis. There was no regurgitation. Peak velocity  (S): 400 cm/s. Mean gradient (S): 37 mm Hg. Peak gradient (S): 64   mm Hg. - Mitral valve: Transvalvular velocity was within the normal range.  There was no evidence for stenosis. There was no regurgitation. - Left atrium: The atrium was mildly dilated. - Right ventricle: The cavity size was normal. Wall thickness was  normal. Systolic function was normal. - Tricuspid valve: There was mild regurgitation. - Pulmonary arteries: Systolic pressure was within the normal  range. PA peak pressure: 31 mm Hg (S). - Pericardium, extracardiac: A trivial pericardial effusion was identified. - Global longitudinal strain -14.3%  (abnormal).  Impressions:  - Compared with the echo 07/2017, the aortic stenosis  severity (moderate to severe) is unchanged. An acending aorta aneurysm was not appreciated.   Neuro/Psych negative neurological ROS  negative psych ROS   GI/Hepatic negative GI ROS, (+) Hepatitis -  Endo/Other  diabetes  Renal/GU negative Renal ROS     Musculoskeletal negative musculoskeletal ROS (+)   Abdominal   Peds  Hematology negative hematology ROS (+) anemia ,   Anesthesia Other Findings   Reproductive/Obstetrics negative OB ROS                             Anesthesia Physical Anesthesia Plan  ASA: IV  Anesthesia Plan: MAC   Post-op Pain Management:    Induction: Intravenous  PONV Risk Score and Plan: Ondansetron and Propofol infusion  Airway Management Planned:   Additional Equipment:   Intra-op Plan:   Post-operative Plan:   Informed Consent: I have reviewed the patients History and Physical, chart, labs and discussed the procedure including the risks, benefits and alternatives for the proposed anesthesia with the patient or authorized representative who has indicated his/her understanding and acceptance.   Dental advisory given  Plan Discussed with: CRNA  Anesthesia Plan Comments:         Anesthesia Quick Evaluation

## 2018-04-09 NOTE — Interval H&P Note (Signed)
History and Physical Interval Note:  04/09/2018 10:33 AM  Kelly [name]  has presented today for surgery, with the diagnosis of esophogeal varices, anemia aortic stinosis  The various methods of treatment have been discussed with the patient and family. After consideration of risks, benefits and other options for treatment, the patient has consented to  Procedure(s): ESOPHAGOGASTRODUODENOSCOPY (EGD) WITH PROPOFOL (N/A) COLONOSCOPY WITH PROPOFOL (N/A) as a surgical intervention .  The patient's history has been reviewed, patient examined, no change in status, stable for surgery.  I have reviewed the patient's chart and labs.  Questions were answered to the patient's satisfaction.     [name]

## 2018-04-09 NOTE — Discharge Instructions (Signed)
YOU HAD AN ENDOSCOPIC PROCEDURE TODAY: Refer to the procedure report and other information in the discharge instructions given to you for any specific questions about what was found during the examination. If this information does not answer your questions, please call [place] office at [phone] to clarify.  ° °YOU SHOULD EXPECT: Some feelings of bloating in the abdomen. Passage of more gas than usual. Walking can help get rid of the air that was put into your GI tract during the procedure and reduce the bloating. If you had a lower endoscopy (such as a colonoscopy or flexible sigmoidoscopy) you [place] notice spotting of blood in your stool or on the toilet paper. Some abdominal soreness Soller be present for a day or two, also. ° °DIET: Your first meal following the procedure should be a light meal and then it is ok to progress to your normal diet. A half-sandwich or bowl of soup is an example of a good first meal. Heavy or fried foods are harder to digest and [place] make you feel nauseous or bloated. Drink plenty of fluids but you should avoid alcoholic beverages for 24 hours. If you had a esophageal dilation, please see attached instructions for diet.   ° °ACTIVITY: Your care partner should take you home directly after the procedure. You should plan to take it easy, moving slowly for the rest of the day. You can resume normal activity the day after the procedure however YOU SHOULD NOT DRIVE, use power tools, machinery or perform tasks that involve climbing or major physical exertion for 24 hours (because of the sedation medicines used during the test).  ° °SYMPTOMS TO REPORT IMMEDIATELY: °A gastroenterologist can be reached at any hour. Please call [phone]  for any of the following symptoms:  °Following lower endoscopy (colonoscopy, flexible sigmoidoscopy) °Excessive amounts of blood in the stool  °Significant tenderness, worsening of abdominal pains  °Swelling of the abdomen that is new, acute  °Fever of 100° or  higher  °Following upper endoscopy (EGD, EUS, ERCP, esophageal dilation) °Vomiting of blood or coffee ground material  °New, significant abdominal pain  °New, significant chest pain or pain under the shoulder blades  °Painful or persistently difficult swallowing  °New shortness of breath  °Black, tarry-looking or red, bloody stools ° °FOLLOW UP:  °If any biopsies were taken you will be contacted by phone or by letter within the next 1-3 weeks. Call [phone]  if you have not heard about the biopsies in 3 weeks.  °Please also call with any specific questions about appointments or follow up tests. ° °

## 2018-04-09 NOTE — Telephone Encounter (Signed)
06/02/18 at 830 am f/u with Dr [name] Lab order in [place]   The pt will be notified per AVS at checkout from [facility] today

## 2018-04-09 NOTE — Op Note (Signed)
[facility] Patient Name: Kelly [name] Procedure Date: 04/09/2018 MRN: [id number] Attending MD: [name] , MD Date of Birth: May 14, 1951 CSN: [id number] Age: 67 Admit Type: Outpatient Procedure:                Upper GI endoscopy Indications:              Iron deficiency anemia, moderate to severe AS,                            known cirrhosis (MELD 7) Providers:                [name], MD, Carmie End, RN,                            William Dalton, Technician Referring MD:              Medicines:                Monitored Anesthesia Care Complications:            No immediate complications. Estimated blood loss:                            None. Estimated Blood Loss:     Estimated blood loss: none. Procedure:                Pre-Anesthesia Assessment:                           - Prior to the procedure, a History and Physical                            was performed, and patient medications and                            allergies were reviewed. The patient's tolerance of                            previous anesthesia was also reviewed. The risks                            and benefits of the procedure and the sedation                            options and risks were discussed with the patient.                            All questions were answered, and informed consent                            was obtained. Prior Anticoagulants: The patient has                            taken no previous anticoagulant or antiplatelet                            agents. ASA  Grade Assessment: IV - A patient with                            severe systemic disease that is a constant threat                            to life. After reviewing the risks and benefits,                            the patient was deemed in satisfactory condition to                            undergo the procedure.                           After obtaining informed consent, the endoscope was                          passed under direct vision. Throughout the                            procedure, the patient's blood pressure, pulse, and                            oxygen saturations were monitored continuously. The                            [id number]-H190 ([id number]) Olympus adult endoscope was                            introduced through the mouth, and advanced to the                            second part of duodenum. The upper GI endoscopy was                            accomplished without difficulty. The patient                            tolerated the procedure well. Scope In: Scope Out: Findings:      Grade II varices were found in the distal esophagus (medium to large,       three trunks). None with signs of recent bleeding.      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      No gastric varices.      The exam was otherwise without abnormality. Impression:               Grade II varices were found in the distal esophagus                            (medium to large, three trunks). None with signs of  recent bleeding.                           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                           No gastric varices.                           Your iron deficiency anemia is at least in part due                            to the effects of cirrhosis on your UGI tract. 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 These AVMs are                            common in the  setting of aortic stenosis.                           It is essential that you keep your iron levels high                            to offset chronic slow blood loss in your GI tract. Moderate Sedation:      N/A- Per Anesthesia Care Recommendation:           - Patient has a contact number available for                            emergencies. The signs and symptoms of potential                            delayed complications were discussed with the                            patient. Return to normal activities tomorrow.  Written discharge instructions were provided to the                            patient.                           - Resume previous diet.                           - Please start once daily OTC iron supplement                            (ferrous sulfate 345m pill).                           - [facility], return office                            visit with Dr. JArdis Hughsin 6-8 weeks.                           - Dr. JArdis Hughswill communicate with Dr. MAngelena Form                           from cardiology about switching to a non-selective                            B Blocker to reduce the chances of bleeding from                            your esophageal varices. Procedure Code(s):        --- Professional ---                           [phone] Esophagogastroduodenoscopy, flexible,                            transoral; diagnostic, including collection of                            specimen(s) by brushing or washing, when performed                            (separate procedure) Diagnosis Code(s):        --- Professional ---                           I85.00, Esophageal varices without bleeding                           D50.9, Iron deficiency anemia, unspecified CPT copyright 2017 American Medical Association. All rights reserved. The codes documented in this report are preliminary and upon coder review [place]  be revised to meet current  compliance requirements. DMilus Banister MD 04/09/2018 11:44:34 AM This report has been signed electronically. Number of Addenda: 0

## 2018-04-09 NOTE — Transfer of Care (Signed)
Immediate Anesthesia Transfer of Care Note  Patient: Kelly [name]  Procedure(s) Performed: ESOPHAGOGASTRODUODENOSCOPY (EGD) WITH PROPOFOL (N/A ) COLONOSCOPY WITH PROPOFOL (N/A ) POLYPECTOMY  Patient Location: PACU  Anesthesia Type:MAC  Level of Consciousness: awake, alert  and oriented  Airway & Oxygen Therapy: Patient Spontanous Breathing and Patient connected to nasal cannula oxygen  Post-op Assessment: Report given to RN and Post -op Vital signs reviewed and stable  Post vital signs: Reviewed and stable  Last Vitals:  Vitals Value Taken Time  BP    Temp    Pulse 74 04/09/2018 11:27 AM  Resp 18 04/09/2018 11:27 AM  SpO2 100 % 04/09/2018 11:27 AM  Vitals shown include unvalidated device data.  Last Pain:  Vitals:   04/09/18 0921  TempSrc: Oral  PainSc: 0-No pain         Complications: No apparent anesthesia complications

## 2018-04-09 NOTE — Op Note (Signed)
[facility] Patient Name: Kelly [name] Procedure Date: 04/09/2018 MRN: [id number] Attending MD: [name] , MD Date of Birth: 14-Oct-1950 CSN: [id number] Age: 67 Admit Type: Outpatient Procedure:                Colonoscopy Indications:              Iron deficiency anemia, moderate to severe AS,                            known cirrhosis Providers:                [name], MD, Carmie End, RN,                            William Dalton, Technician Referring MD:              Medicines:                Monitored Anesthesia Care Complications:            No immediate complications. Estimated blood loss:                            None. Estimated Blood Loss:     Estimated blood loss: none. Procedure:                Pre-Anesthesia Assessment:                           - Prior to the procedure, a History and Physical                            was performed, and patient medications and                            allergies were reviewed. The patient's tolerance of                            previous anesthesia was also reviewed. The risks                            and benefits of the procedure and the sedation                            options and risks were discussed with the patient.                            All questions were answered, and informed consent                            was obtained. Prior Anticoagulants: The patient has                            taken no previous anticoagulant or antiplatelet                            agents. ASA Grade Assessment: III -  A patient with                            severe systemic disease. After reviewing the risks                            and benefits, the patient was deemed in                            satisfactory condition to undergo the procedure.                           After obtaining informed consent, the colonoscope                            was passed under direct vision. Throughout the                      procedure, the patient's blood pressure, pulse, and                            oxygen saturations were monitored continuously. The                            CF-HQ190L ([id number] Olympus adult colonoscope was                            introduced through the anus and advanced to the the                            cecum, identified by appendiceal orifice and                            ileocecal valve. The colonoscopy was performed                            without difficulty. The patient tolerated the                            procedure well. The quality of the bowel                            preparation was good. The ileocecal valve,                            appendiceal orifice, and rectum were photographed. Scope In: 10:52:34 AM Scope Out: 11:08:10 AM Scope Withdrawal Time: 0 hours 11 minutes 52 seconds  Total Procedure Duration: 0 hours 15 minutes 36 seconds  Findings:      A 4 mm polyp was found in the transverse colon. The polyp was sessile.       The polyp was removed with a cold snare. Resection and retrieval were       complete.      There were several focal areas (1-2cm across) of minor erythema       throughout the colon. These were not classic  appearing AVMs but Schl[place]       represent atypical colon AVMs. None were bleeding.      Multiple small and large-mouthed diverticula were found in the left       colon.      The exam was otherwise without abnormality on direct and retroflexion       views. Impression:               - One 4 mm polyp in the transverse colon, removed                            with a cold snare. Resected and retrieved.                           - Diverticulosis in the left colon.                           - There were several focal areas (1-2cm across) of                            minor erythema throughout the colon. These were not                            classic appearing AVMs but [place] represent atypical                             colon AVMs. None were bleeding.                           - The examination was otherwise normal on direct                            and retroflexion views. Moderate Sedation:      N/A- Per Anesthesia Care Recommendation:           - Patient has a contact number available for                            emergencies. The signs and symptoms of potential                            delayed complications were discussed with the                            patient. Return to normal activities tomorrow.                            Written discharge instructions were provided to the                            patient.                           - Resume previous diet.                           -  Continue present medications.                           - Await pathology results. Procedure Code(s):        --- Professional ---                           [phone], Colonoscopy, flexible; with removal of                            tumor(s), polyp(s), or other lesion(s) by snare                            technique Diagnosis Code(s):        --- Professional ---                           D12.3, Benign neoplasm of transverse colon (hepatic                            flexure or splenic flexure)                           D50.9, Iron deficiency anemia, unspecified                           K57.30, Diverticulosis of large intestine without                            perforation or abscess without bleeding CPT copyright 2017 American Medical Association. All rights reserved. The codes documented in this report are preliminary and upon coder review [place]  be revised to meet current compliance requirements. [name], MD 04/09/2018 11:28:58 AM This report has been signed electronically. Number of Addenda: 0

## 2018-04-10 ENCOUNTER — Encounter: Payer: Self-pay | Admitting: Family Medicine

## 2018-04-10 DIAGNOSIS — I8500 Esophageal varices without bleeding: Secondary | ICD-10-CM | POA: Insufficient documentation

## 2018-04-10 DIAGNOSIS — K922 Gastrointestinal hemorrhage, unspecified: Secondary | ICD-10-CM | POA: Insufficient documentation

## 2018-04-10 NOTE — Anesthesia Postprocedure Evaluation (Signed)
Anesthesia Post Note  Patient: Kelly [name]  Procedure(s) Performed: ESOPHAGOGASTRODUODENOSCOPY (EGD) WITH PROPOFOL (N/A ) COLONOSCOPY WITH PROPOFOL (N/A ) POLYPECTOMY     Patient location during evaluation: PACU Anesthesia Type: MAC Level of consciousness: awake and alert Pain management: pain level controlled Vital Signs Assessment: post-procedure vital signs reviewed and stable Respiratory status: spontaneous breathing Cardiovascular status: stable Anesthetic complications: no    Last Vitals:  Vitals:   04/09/18 1140 04/09/18 1150  BP: (!) 167/65   Pulse: (!) 52   Resp: 16   Temp:    SpO2: 97% 97%    Last Pain:  Vitals:   04/09/18 1150  TempSrc:   PainSc: 0-No pain                 Nolon Nations

## 2018-04-13 ENCOUNTER — Other Ambulatory Visit: Payer: Self-pay

## 2018-04-13 MED ORDER — NADOLOL 20 MG PO TABS: 20.0000 mg | ORAL_TABLET | Freq: Every day | ORAL | 3 refills | Status: DC

## 2018-04-14 ENCOUNTER — Telehealth: Payer: Self-pay | Admitting: Family Medicine

## 2018-04-14 ENCOUNTER — Other Ambulatory Visit: Payer: Self-pay | Admitting: Family Medicine

## 2018-04-14 MED ORDER — FERROUS SULFATE 325 (65 FE) MG PO TABS: 325.0000 mg | ORAL_TABLET | Freq: Every day | ORAL | 3 refills | Status: DC

## 2018-04-14 MED ORDER — NADOLOL 20 MG PO TABS: 20.0000 mg | ORAL_TABLET | Freq: Every day | ORAL | 3 refills | Status: DC

## 2018-04-14 NOTE — Telephone Encounter (Signed)
Pt called and states that Dr. [name] changed one of her meds and the new med is $45 a month (Metoprolol to Nadolol) and would like new med sent to [facility] as it will be free through them. Med sent to [facility] and informed pt not to stop taking the Metoprolol until this medication arrived. Also sent in a rx for her Iron as well.

## 2018-04-17 ENCOUNTER — Telehealth: Payer: Self-pay | Admitting: Gastroenterology

## 2018-04-21 MED ORDER — PROPRANOLOL HCL 40 MG PO TABS: 40.0000 mg | ORAL_TABLET | Freq: Two times a day (BID) | ORAL | 0 refills | Status: DC

## 2018-04-21 NOTE — Telephone Encounter (Signed)
Lets try propranolol 52m pills, one pill twice daily, disp one month with 3 refills. She needs RN visit in 2 weeks after starting that medicine to check BP and HR.  Thanks

## 2018-04-21 NOTE — Telephone Encounter (Signed)
Dr [name], is there an alternative for Nadalol? Patient states it is too expensive.

## 2018-04-21 NOTE — Telephone Encounter (Signed)
Spoke to patient to inform her that Propranolol 45m twice daily has been sent to her pharmacy. [facility] She will come in for a nurse visit 05/05/18 at 9am for a blood pressure check. Patient voiced understanding.

## 2018-04-28 ENCOUNTER — Telehealth: Payer: Self-pay | Admitting: Gastroenterology

## 2018-04-28 NOTE — Telephone Encounter (Signed)
Spoke to patient to inform her that [facility] has sent out Propranolol and it has been approved.Patient voiced understanding

## 2018-05-05 ENCOUNTER — Ambulatory Visit: Payer: Medicare HMO | Admitting: Gastroenterology

## 2018-05-05 ENCOUNTER — Encounter: Payer: Self-pay | Admitting: Family Medicine

## 2018-05-05 ENCOUNTER — Other Ambulatory Visit (INDEPENDENT_AMBULATORY_CARE_PROVIDER_SITE_OTHER): Payer: Medicare HMO

## 2018-05-05 ENCOUNTER — Ambulatory Visit (INDEPENDENT_AMBULATORY_CARE_PROVIDER_SITE_OTHER): Payer: Medicare HMO | Admitting: Family Medicine

## 2018-05-05 VITALS — BP 190/98 | HR 56 | Temp 97.8°F | Resp 18 | Ht 64.5 in | Wt 206.0 lb

## 2018-05-05 VITALS — BP 220/90 | HR 60

## 2018-05-05 DIAGNOSIS — K7460 Unspecified cirrhosis of liver: Secondary | ICD-10-CM

## 2018-05-05 DIAGNOSIS — I160 Hypertensive urgency: Secondary | ICD-10-CM

## 2018-05-05 DIAGNOSIS — D509 Iron deficiency anemia, unspecified: Secondary | ICD-10-CM

## 2018-05-05 LAB — CBC WITH DIFFERENTIAL/PLATELET
Basophils Absolute: 0.1 10*3/uL (ref 0.0–0.1)
Basophils Relative: 0.7 % (ref 0.0–3.0)
EOS PCT: 6.1 % — AB (ref 0.0–5.0)
Eosinophils Absolute: 0.5 10*3/uL (ref 0.0–0.7)
HCT: 37.2 % (ref 36.0–46.0)
HEMOGLOBIN: 12.9 g/dL (ref 12.0–15.0)
Lymphocytes Relative: 28.9 % (ref 12.0–46.0)
Lymphs Abs: 2.4 10*3/uL (ref 0.7–4.0)
MCHC: 34.7 g/dL (ref 30.0–36.0)
MCV: 81.5 fl (ref 78.0–100.0)
MONOS PCT: 9 % (ref 3.0–12.0)
Monocytes Absolute: 0.7 10*3/uL (ref 0.1–1.0)
Neutro Abs: 4.5 10*3/uL (ref 1.4–7.7)
Neutrophils Relative %: 55.3 % (ref 43.0–77.0)
Platelets: 104 10*3/uL — ABNORMAL LOW (ref 150.0–400.0)
RBC: 4.57 Mil/uL (ref 3.87–5.11)
RDW: 18 % — ABNORMAL HIGH (ref 11.5–15.5)
WBC: 8.1 10*3/uL (ref 4.0–10.5)

## 2018-05-05 MED ORDER — AMLODIPINE BESYLATE 10 MG PO TABS: 10.0000 mg | ORAL_TABLET | Freq: Every day | ORAL | 3 refills | Status: DC

## 2018-05-05 NOTE — Progress Notes (Signed)
Subjective:    Patient ID: Kelly [name], female    DOB: 02-Vetsch-1952, 67 y.o.   MRN: [id number]  Patient was referred here from her gastroenterologist office due to elevated blood pressure.  Her blood pressure was 220/90.  Here upon arrival her blood pressure is 190/98.  I rechecked this and verified it myself.  Patient denies any chest pain.  She denies any shortness of breath.  She denies any headache.  She denies any blurry vision.  She denies any oliguria or hematuria.  There is no evidence of endorgan damage to suggest hypertensive emergency.  I did perform an EKG today which shows T wave inversions in the lateral leads I, aVL, V4, V5, and V6.  This is concerning for lateral ischemia.  However these T wave inversions were present on her EKG from February of this year and appear to be chronic for this patient.  Today she denies any chest pain or shortness of breath.  After sitting in our office relaxing for approximately 30 minutes, her blood pressure is fallen to 180/80.  While still elevated, this is reassuring.  She believes a lot of her blood pressure is elevated this morning due to stress and anxiety.  She has an appointment next week to see her cardiologist to discuss surgical repair of her aortic valve.  This has her extremely worried   Past Medical History:  Diagnosis Date  . Acute encephalopathy 09/14/2016  . Adenomatous polyp   . Anxiety   . Aortic stenosis   . Arrhythmia   . Chronic upper GI bleeding    intestinal AVM's, portal gastropathy, esophageal varices on egd 04/2018  . Dementia ([place]) 09/16/2016  . Diabetes ([place])   . DIVERTICULITIS OF COLON 06/14/2008   Qualifier: Diagnosis of  By: [name]    . Diverticulosis   . DM (diabetes mellitus), type 2 ([place]) 09/15/2016  . Esophageal varices determined by endoscopy ([place])   . Fatty liver   . Hyperlipidemia   . Hypertension   . Irritable bowel syndrome 04/06/2008   Centricity Description: IBS Qualifier: Diagnosis of  By: [name]  CMA ([name]), [name]   Centricity Description: IRRITABLE BOWEL SYNDROME Qualifier: Diagnosis of  By: [name] MD, [name]   . Liver cirrhosis secondary to NASH ([place]) 09/16/2016  . NASH (nonalcoholic steatohepatitis)   . Obesity   . Portal hypertensive gastropathy ([place])   . SOB (shortness of breath)   . Splenomegaly 09/16/2016  . Thrombocytopenia ([place])   . Thyroid disease   . Vitamin B 12 deficiency 09/16/2016  . Vitamin D deficiency    Past Surgical History:  Procedure Laterality Date  . COLONOSCOPY WITH PROPOFOL N/A 04/09/2018   Procedure: COLONOSCOPY WITH PROPOFOL;  Surgeon: [name], MD;  Location: [facility];  Service: Endoscopy;  Laterality: N/A;  . ESOPHAGOGASTRODUODENOSCOPY (EGD) WITH PROPOFOL N/A 04/09/2018   Procedure: ESOPHAGOGASTRODUODENOSCOPY (EGD) WITH PROPOFOL;  Surgeon: [name], MD;  Location: [facility];  Service: Endoscopy;  Laterality: N/A;  . oopherectomy    . OVARIAN CYST REMOVAL    . POLYPECTOMY  04/09/2018   Procedure: POLYPECTOMY;  Surgeon: [name], MD;  Location: [facility];  Service: Endoscopy;;   Current Outpatient Medications on File Prior to Visit  Medication Sig Dispense Refill  . ACCU-CHEK AVIVA PLUS test strip     . ACCU-CHEK SOFTCLIX LANCETS lancets     . atorvastatin (LIPITOR) 20 MG tablet TAKE 1 TABLET (20 MG TOTAL) BY MOUTH DAILY. 90 tablet 3  .  Blood Glucose Monitoring Suppl (ACCU-CHEK AVIVA PLUS) w/Device KIT     . ferrous sulfate 325 (65 FE) MG tablet Take 1 tablet (325 mg total) by mouth daily. 90 tablet 3  . furosemide (LASIX) 20 MG tablet Take 1 tablet (20 mg total) by mouth daily. 90 tablet 3  . levothyroxine (SYNTHROID, LEVOTHROID) 75 MCG tablet Take 1 tablet (75 mcg total) by mouth daily. 90 tablet 3  . losartan (COZAAR) 100 MG tablet Take 1 tablet (100 mg total) by mouth daily. Stop lisinopril hctz 90 tablet 3  . metFORMIN (GLUCOPHAGE-XR) 500 MG 24 hr tablet TAKE 2 TABLETS TWICE DAILY 360 tablet 1  . nadolol (CORGARD)  20 MG tablet Take 1 tablet (20 mg total) by mouth daily. 90 tablet 3  . propranolol (INDERAL) 40 MG tablet Take 1 tablet (40 mg total) by mouth 2 (two) times daily. 180 tablet 0  . traZODone (DESYREL) 50 MG tablet TAKE 1 TABLET AT BEDTIME AS NEEDED FOR SLEEP 90 tablet 1  . zolpidem (AMBIEN) 5 MG tablet TAKE 1 TABLET BY MOUTH EVERY DAY AT BEDTIME AS NEEDED FOR SLEEP (Patient taking differently: Take 5 mg by mouth at bedtime as needed for sleep. ) 30 tablet 2  . pantoprazole (PROTONIX) 40 MG tablet Take 1 tablet (40 mg total) by mouth daily. (Patient not taking: Reported on 04/02/2018) 30 tablet 3  . polyethylene glycol-electrolytes (NULYTELY/GOLYTELY) 420 g solution Take 4,000 mLs by mouth as directed. (Patient not taking: Reported on 05/05/2018) 4000 mL 0  . potassium chloride SA (K-DUR,KLOR-CON) 20 MEQ tablet Take 1 tablet (20 mEq total) by mouth daily. (Patient not taking: Reported on 04/02/2018) 90 tablet 3  . venlafaxine XR (EFFEXOR XR) 75 MG 24 hr capsule Take 1 capsule (75 mg total) by mouth daily with breakfast. 1 a day for 1 week then 2 tabs a day thereafter (Patient not taking: Reported on 04/02/2018) 60 capsule 3   Current Facility-Administered Medications on File Prior to Visit  Medication Dose Route Frequency Provider Last Rate Last Dose  . cyanocobalamin ((VITAMIN B-12)) injection 1,000 mcg  1,000 mcg Subcutaneous Q30 days [name], MD   1,000 mcg at 09/08/17 1408   Allergies  Allergen Reactions  . Celecoxib   . Prednisone Itching    Heart rate increased  . Macrodantin [Nitrofurantoin Macrocrystal] Rash  . Penicillins Hives    Per family Has patient had a PCN reaction causing immediate rash, facial/tongue/throat swelling, SOB or lightheadedness with hypotension: Unknown Has patient had a PCN reaction causing severe rash involving mucus membranes or skin necrosis: Unknown Has patient had a PCN reaction that required hospitalization: No Has patient had a PCN reaction occurring  within the last 10 years: No If all of the above answers are "NO", then [place] proceed with Cephalosporin use.    Social History   Socioeconomic History  . Marital status: Married    Spouse name: Not on file  . Number of children: Not on file  . Years of education: Not on file  . Highest education level: Not on file  Occupational History  . Not on file  Social Needs  . Financial resource strain: Not on file  . Food insecurity:    Worry: Not on file    Inability: Not on file  . Transportation needs:    Medical: Not on file    Non-medical: Not on file  Tobacco Use  . Smoking status: Current Every Day Smoker    Packs/day: 0.50  Types: Cigarettes  . Smokeless tobacco: Never Used  Substance and Sexual Activity  . Alcohol use: No  . Drug use: No  . Sexual activity: Not Currently  Lifestyle  . Physical activity:    Days per week: Not on file    Minutes per session: Not on file  . Stress: Not on file  Relationships  . Social connections:    Talks on phone: Not on file    Gets together: Not on file    Attends religious service: Not on file    Active member of club or organization: Not on file    Attends meetings of clubs or organizations: Not on file    Relationship status: Not on file  . Intimate partner violence:    Fear of current or ex partner: Not on file    Emotionally abused: Not on file    Physically abused: Not on file    Forced sexual activity: Not on file  Other Topics Concern  . Not on file  Social History Narrative  . Not on file   Family History  Problem Relation Age of Onset  . Lung cancer Father   . Heart disease Father        MI at age 47  . Alcohol abuse Father   . Alzheimer's disease Mother   . Dementia Mother   . Other Mother        twisted colon   . Stomach cancer Neg Hx   . Colon cancer Neg Hx       Review of Systems  Psychiatric/Behavioral: The patient has insomnia.   All other systems reviewed and are negative.      Objective:     Physical Exam  Constitutional: She is oriented to person, place, and time. She appears well-developed and well-nourished.  HENT:  Right Ear: External ear normal.  Left Ear: External ear normal.  Nose: Nose normal.  Mouth/Throat: Oropharynx is clear and moist. No oropharyngeal exudate.  Eyes: Pupils are equal, round, and reactive to light. Conjunctivae and EOM are normal.  Neck: Neck supple. No JVD present. No thyromegaly present.  Cardiovascular: Normal rate and regular rhythm.  Murmur heard.  Systolic murmur is present with a grade of 3/6. Pulmonary/Chest: Effort normal. No respiratory distress. She has no wheezes. She has no rales.  Abdominal: Soft. Bowel sounds are normal. She exhibits no distension. There is no tenderness. There is no rebound and no guarding.  Musculoskeletal: She exhibits no edema.  Lymphadenopathy:    She has no cervical adenopathy.  Neurological: She is alert and oriented to person, place, and time. She has normal reflexes. No cranial nerve deficit. She exhibits normal muscle tone. Coordination normal.  Psychiatric: She has a normal mood and affect. Her behavior is normal. Judgment and thought content normal.  Vitals reviewed.         Assessment & Plan:  Hypertensive urgency - Plan: amLODipine (NORVASC) 10 MG tablet, EKG 12-Lead  EKG today shows no change from her previous EKG in February.  Her symptoms are reassuring.  Her blood pressure has fallen substantially since this morning and is now down to 180/80.  I will add amlodipine 10 mg p.o. daily to her current medication and recheck her blood pressure here in 48 hours.  She is instructed to go to the hospital immediately if she develops chest pain, shortness of breath, palpitations, severe headache, confusion, etc.  Patient understands and is comfortable with the plan.  I will see the patient back  in 48 hours to recheck her blood pressure on the medication.

## 2018-05-05 NOTE — Progress Notes (Signed)
Per Dr [name] Patient needs to go to PCP today or tomorrow

## 2018-05-05 NOTE — Progress Notes (Signed)
BP high rechecked by Koren Shiver RN was 210/86 she is going to speak with Dr [name] about patients blood pressure now

## 2018-05-07 ENCOUNTER — Ambulatory Visit (INDEPENDENT_AMBULATORY_CARE_PROVIDER_SITE_OTHER): Payer: Medicare HMO | Admitting: Family Medicine

## 2018-05-07 ENCOUNTER — Encounter: Payer: Self-pay | Admitting: Family Medicine

## 2018-05-07 VITALS — BP 158/80 | HR 58 | Temp 97.9°F | Resp 16 | Ht 64.5 in | Wt 206.0 lb

## 2018-05-07 DIAGNOSIS — I10 Essential (primary) hypertension: Secondary | ICD-10-CM | POA: Diagnosis not present

## 2018-05-07 NOTE — Progress Notes (Signed)
Subjective:    Patient ID: Kelly [name], female    DOB: 1951/06/04, 67 y.o.   MRN: [id number] 05/05/18 Patient was referred here from her gastroenterologist office due to elevated blood pressure.  Her blood pressure was 220/90.  Here upon arrival her blood pressure is 190/98.  I rechecked this and verified it myself.  Patient denies any chest pain.  She denies any shortness of breath.  She denies any headache.  She denies any blurry vision.  She denies any oliguria or hematuria.  There is no evidence of endorgan damage to suggest hypertensive emergency.  I did perform an EKG today which shows T wave inversions in the lateral leads I, aVL, V4, V5, and V6.  This is concerning for lateral ischemia.  However these T wave inversions were present on her EKG from February of this year and appear to be chronic for this patient.  Today she denies any chest pain or shortness of breath.  After sitting in our office relaxing for approximately 30 minutes, her blood pressure is fallen to 180/80.  While still elevated, this is reassuring.  She believes a lot of her blood pressure is elevated this morning due to stress and anxiety.  She has an appointment next week to see her cardiologist to discuss surgical repair of her aortic valve.  This has her extremely worried.  At that time, my plan was: EKG today shows no change from her previous EKG in February.  Her symptoms are reassuring.  Her blood pressure has fallen substantially since this morning and is now down to 180/80.  I will add amlodipine 10 mg p.o. daily to her current medication and recheck her blood pressure here in 48 hours.  She is instructed to go to the hospital immediately if she develops chest pain, shortness of breath, palpitations, severe headache, confusion, etc.  Patient understands and is comfortable with the plan.  I will see the patient back in 48 hours to recheck her blood pressure on the medication. 05/07/18 Since taking the amlodipine, her blood  pressure has improved.  However I got her blood pressure higher than reported.  When I checked her blood pressure was 158/80.  This is still much better than it was earlier this week and the patient is only been on the medication for 2 days.  She denies any chest pain shortness of breath or dyspnea on exertion.  She denies any headaches or blurry vision.  Bradycardia is slightly worse on the amlodipine coupled with the propranolol but still acceptable.   Past Medical History:  Diagnosis Date  . Acute encephalopathy 09/14/2016  . Adenomatous polyp   . Anxiety   . Aortic stenosis   . Arrhythmia   . Chronic upper GI bleeding    intestinal AVM's, portal gastropathy, esophageal varices on egd 04/2018  . Dementia ([place]) 09/16/2016  . Diabetes ([place])   . DIVERTICULITIS OF COLON 06/14/2008   Qualifier: Diagnosis of  By: [name]    . Diverticulosis   . DM (diabetes mellitus), type 2 ([place]) 09/15/2016  . Esophageal varices determined by endoscopy ([place])   . Fatty liver   . Hyperlipidemia   . Hypertension   . Irritable bowel syndrome 04/06/2008   Centricity Description: IBS Qualifier: Diagnosis of  By: [name] CMA ([name]), [name]   Centricity Description: IRRITABLE BOWEL SYNDROME Qualifier: Diagnosis of  By: [name] MD, [name]   . Liver cirrhosis secondary to NASH ([place]) 09/16/2016  . NASH (nonalcoholic steatohepatitis)   .  Obesity   . Portal hypertensive gastropathy ([place])   . SOB (shortness of breath)   . Splenomegaly 09/16/2016  . Thrombocytopenia ([place])   . Thyroid disease   . Vitamin B 12 deficiency 09/16/2016  . Vitamin D deficiency    Past Surgical History:  Procedure Laterality Date  . COLONOSCOPY WITH PROPOFOL N/A 04/09/2018   Procedure: COLONOSCOPY WITH PROPOFOL;  Surgeon: [name], MD;  Location: [facility];  Service: Endoscopy;  Laterality: N/A;  . ESOPHAGOGASTRODUODENOSCOPY (EGD) WITH PROPOFOL N/A 04/09/2018   Procedure: ESOPHAGOGASTRODUODENOSCOPY (EGD) WITH PROPOFOL;  Surgeon:  [name], MD;  Location: [facility];  Service: Endoscopy;  Laterality: N/A;  . oopherectomy    . OVARIAN CYST REMOVAL    . POLYPECTOMY  04/09/2018   Procedure: POLYPECTOMY;  Surgeon: [name], MD;  Location: [facility];  Service: Endoscopy;;   Current Outpatient Medications on File Prior to Visit  Medication Sig Dispense Refill  . ACCU-CHEK AVIVA PLUS test strip     . ACCU-CHEK SOFTCLIX LANCETS lancets     . amLODipine (NORVASC) 10 MG tablet Take 1 tablet (10 mg total) by mouth daily. 90 tablet 3  . atorvastatin (LIPITOR) 20 MG tablet TAKE 1 TABLET (20 MG TOTAL) BY MOUTH DAILY. 90 tablet 3  . Blood Glucose Monitoring Suppl (ACCU-CHEK AVIVA PLUS) w/Device KIT     . ferrous sulfate 325 (65 FE) MG tablet Take 1 tablet (325 mg total) by mouth daily. 90 tablet 3  . furosemide (LASIX) 20 MG tablet Take 1 tablet (20 mg total) by mouth daily. 90 tablet 3  . levothyroxine (SYNTHROID, LEVOTHROID) 75 MCG tablet Take 1 tablet (75 mcg total) by mouth daily. 90 tablet 3  . losartan (COZAAR) 100 MG tablet Take 1 tablet (100 mg total) by mouth daily. Stop lisinopril hctz 90 tablet 3  . metFORMIN (GLUCOPHAGE-XR) 500 MG 24 hr tablet TAKE 2 TABLETS TWICE DAILY 360 tablet 1  . pantoprazole (PROTONIX) 40 MG tablet Take 1 tablet (40 mg total) by mouth daily. 30 tablet 3  . polyethylene glycol-electrolytes (NULYTELY/GOLYTELY) 420 g solution Take 4,000 mLs by mouth as directed. 4000 mL 0  . potassium chloride SA (K-DUR,KLOR-CON) 20 MEQ tablet Take 1 tablet (20 mEq total) by mouth daily. 90 tablet 3  . propranolol (INDERAL) 40 MG tablet Take 1 tablet (40 mg total) by mouth 2 (two) times daily. 180 tablet 0  . traZODone (DESYREL) 50 MG tablet TAKE 1 TABLET AT BEDTIME AS NEEDED FOR SLEEP 90 tablet 1  . zolpidem (AMBIEN) 5 MG tablet TAKE 1 TABLET BY MOUTH EVERY DAY AT BEDTIME AS NEEDED FOR SLEEP (Patient taking differently: Take 5 mg by mouth at bedtime as needed for sleep. ) 30 tablet 2   Current  Facility-Administered Medications on File Prior to Visit  Medication Dose Route Frequency Provider Last Rate Last Dose  . cyanocobalamin ((VITAMIN B-12)) injection 1,000 mcg  1,000 mcg Subcutaneous Q30 days [name], MD   1,000 mcg at 09/08/17 1408   Allergies  Allergen Reactions  . Celecoxib   . Prednisone Itching    Heart rate increased  . Macrodantin [Nitrofurantoin Macrocrystal] Rash  . Penicillins Hives    Per family Has patient had a PCN reaction causing immediate rash, facial/tongue/throat swelling, SOB or lightheadedness with hypotension: Unknown Has patient had a PCN reaction causing severe rash involving mucus membranes or skin necrosis: Unknown Has patient had a PCN reaction that required hospitalization: No Has patient had a PCN reaction occurring  within the last 10 years: No If all of the above answers are "NO", then [place] proceed with Cephalosporin use.    Social History   Socioeconomic History  . Marital status: Married    Spouse name: Not on file  . Number of children: Not on file  . Years of education: Not on file  . Highest education level: Not on file  Occupational History  . Not on file  Social Needs  . Financial resource strain: Not on file  . Food insecurity:    Worry: Not on file    Inability: Not on file  . Transportation needs:    Medical: Not on file    Non-medical: Not on file  Tobacco Use  . Smoking status: Current Every Day Smoker    Packs/day: 0.50    Types: Cigarettes  . Smokeless tobacco: Never Used  Substance and Sexual Activity  . Alcohol use: No  . Drug use: No  . Sexual activity: Not Currently  Lifestyle  . Physical activity:    Days per week: Not on file    Minutes per session: Not on file  . Stress: Not on file  Relationships  . Social connections:    Talks on phone: Not on file    Gets together: Not on file    Attends religious service: Not on file    Active member of club or organization: Not on file    Attends  meetings of clubs or organizations: Not on file    Relationship status: Not on file  . Intimate partner violence:    Fear of current or ex partner: Not on file    Emotionally abused: Not on file    Physically abused: Not on file    Forced sexual activity: Not on file  Other Topics Concern  . Not on file  Social History Narrative  . Not on file   Family History  Problem Relation Age of Onset  . Lung cancer Father   . Heart disease Father        MI at age 62  . Alcohol abuse Father   . Alzheimer's disease Mother   . Dementia Mother   . Other Mother        twisted colon   . Stomach cancer Neg Hx   . Colon cancer Neg Hx       Review of Systems  All other systems reviewed and are negative.      Objective:   Physical Exam  Constitutional: She is oriented to person, place, and time. She appears well-developed and well-nourished.  HENT:  Right Ear: External ear normal.  Left Ear: External ear normal.  Nose: Nose normal.  Mouth/Throat: Oropharynx is clear and moist. No oropharyngeal exudate.  Eyes: Pupils are equal, round, and reactive to light. Conjunctivae and EOM are normal.  Neck: Neck supple. No JVD present. No thyromegaly present.  Cardiovascular: Normal rate and regular rhythm.  Murmur heard.  Systolic murmur is present with a grade of 3/6. Pulmonary/Chest: Effort normal. No respiratory distress. She has no wheezes. She has no rales.  Abdominal: Soft. Bowel sounds are normal. She exhibits no distension. There is no tenderness. There is no rebound and no guarding.  Musculoskeletal: She exhibits no edema.  Lymphadenopathy:    She has no cervical adenopathy.  Neurological: She is alert and oriented to person, place, and time. She has normal reflexes. No cranial nerve deficit. She exhibits normal muscle tone. Coordination normal.  Psychiatric: She has a normal  mood and affect. Her behavior is normal. Judgment and thought content normal.  Vitals reviewed.           Assessment & Plan:  Essential hypertension  Patient's blood pressure is much safer now than earlier this week.  I would give 2 weeks for the amlodipine to take full effect and then recheck her blood pressure.  If still elevated at that time, we [place] need to add hydrochlorothiazide.  Her bradycardia will need to be monitored with the amlodipine in addition to the propranolol.  However the present time I believe her heart rate of 58 is acceptable.

## 2018-05-14 NOTE — Progress Notes (Signed)
Chief Complaint  Patient presents with  . Follow-up    aortic stenosis   History of Present Illness: 67 yo female with history of DM, HTN, HLD, memory issues,  tobacco abuse and aortic stenosis who is here today for cardiac follow up. I saw her as a new patient in February 2019 for aortic valve stenosis. Echo December 2018 with normal LV systolic function. The aortic valve was thickened with restricted leaflet excursion, mean gradient 36 mmHg, peak gradient 59 mmHg, AVA 1.22 cm2, DVI 0.39. She had mild dyspnea with exertion with fatigue and mild dizziness at times. No exertional chest pain. No near syncope, syncope or LE edema. Her aortic stenosis was moderate by echo criteria. Echo 01/27/18 with normal LV systolic function, moderately severe AS with mean gradient of 37 mmHg, peak gradient 64 mmHg, AVA 1.17cm2, DVI 0.37. At her visit here in July 2019 she was feeling fatigued but she had also just been found to be anemic. Colonoscopy September 2019 with non bleeding AVMs and diverticular disease. Non-bleeding esophageal varices were seen on upper endoscopy. Last Hgb 12.9 on 05/05/18.  She is here today for follow up. The patient denies any chest pain, dyspnea, palpitations, lower extremity edema, orthopnea, PND, dizziness, near syncope or syncope.   Primary Care Physician: [name], MD  Past Medical History:  Diagnosis Date  . Acute encephalopathy 09/14/2016  . Adenomatous polyp   . Anxiety   . Aortic stenosis   . Arrhythmia   . Chronic upper GI bleeding    intestinal AVM's, portal gastropathy, esophageal varices on egd 04/2018  . Dementia ([place]) 09/16/2016  . Diabetes ([place])   . DIVERTICULITIS OF COLON 06/14/2008   Qualifier: Diagnosis of  By: [name]    . Diverticulosis   . DM (diabetes mellitus), type 2 ([place]) 09/15/2016  . Esophageal varices determined by endoscopy ([place])   . Fatty liver   . Hyperlipidemia   . Hypertension   . Irritable bowel syndrome 04/06/2008   Centricity Description: IBS Qualifier: Diagnosis of  By: [name] CMA ([name]), [name]   Centricity Description: IRRITABLE BOWEL SYNDROME Qualifier: Diagnosis of  By: [name] MD, [name]   . Liver cirrhosis secondary to NASH ([place]) 09/16/2016  . NASH (nonalcoholic steatohepatitis)   . Obesity   . Portal hypertensive gastropathy ([place])   . SOB (shortness of breath)   . Splenomegaly 09/16/2016  . Thrombocytopenia ([place])   . Thyroid disease   . Vitamin B 12 deficiency 09/16/2016  . Vitamin D deficiency     Past Surgical History:  Procedure Laterality Date  . COLONOSCOPY WITH PROPOFOL N/A 04/09/2018   Procedure: COLONOSCOPY WITH PROPOFOL;  Surgeon: [name], MD;  Location: [facility];  Service: Endoscopy;  Laterality: N/A;  . ESOPHAGOGASTRODUODENOSCOPY (EGD) WITH PROPOFOL N/A 04/09/2018   Procedure: ESOPHAGOGASTRODUODENOSCOPY (EGD) WITH PROPOFOL;  Surgeon: [name], MD;  Location: [facility];  Service: Endoscopy;  Laterality: N/A;  . oopherectomy    . OVARIAN CYST REMOVAL    . POLYPECTOMY  04/09/2018   Procedure: POLYPECTOMY;  Surgeon: [name], MD;  Location: [facility];  Service: Endoscopy;;    Current Outpatient Medications  Medication Sig Dispense Refill  . ACCU-CHEK AVIVA PLUS test strip     . ACCU-CHEK SOFTCLIX LANCETS lancets     . amLODipine (NORVASC) 10 MG tablet Take 1 tablet (10 mg total) by mouth daily. 90 tablet 3  . atorvastatin (LIPITOR) 20 MG tablet TAKE 1 TABLET (20 MG TOTAL) BY  MOUTH DAILY. 90 tablet 3  . Blood Glucose Monitoring Suppl (ACCU-CHEK AVIVA PLUS) w/Device KIT     . ferrous sulfate 325 (65 FE) MG tablet Take 1 tablet (325 mg total) by mouth daily. 90 tablet 3  . furosemide (LASIX) 20 MG tablet Take 1 tablet (20 mg total) by mouth daily. 90 tablet 3  . levothyroxine (SYNTHROID, LEVOTHROID) 75 MCG tablet Take 1 tablet (75 mcg total) by mouth daily. 90 tablet 3  . losartan (COZAAR) 100 MG tablet Take 1 tablet (100 mg total) by mouth daily. Stop  lisinopril hctz 90 tablet 3  . metFORMIN (GLUCOPHAGE-XR) 500 MG 24 hr tablet TAKE 2 TABLETS TWICE DAILY 360 tablet 1  . pantoprazole (PROTONIX) 40 MG tablet Take 1 tablet (40 mg total) by mouth daily. 30 tablet 3  . polyethylene glycol-electrolytes (NULYTELY/GOLYTELY) 420 g solution Take 4,000 mLs by mouth as directed. 4000 mL 0  . potassium chloride SA (K-DUR,KLOR-CON) 20 MEQ tablet Take 1 tablet (20 mEq total) by mouth daily. 90 tablet 3  . propranolol (INDERAL) 40 MG tablet Take 1 tablet (40 mg total) by mouth 2 (two) times daily. 180 tablet 0  . traZODone (DESYREL) 50 MG tablet TAKE 1 TABLET AT BEDTIME AS NEEDED FOR SLEEP 90 tablet 1  . zolpidem (AMBIEN) 5 MG tablet TAKE 1 TABLET BY MOUTH EVERY DAY AT BEDTIME AS NEEDED FOR SLEEP (Patient taking differently: Take 5 mg by mouth at bedtime as needed for sleep. ) 30 tablet 2   Current Facility-Administered Medications  Medication Dose Route Frequency Provider Last Rate Last Dose  . cyanocobalamin ((VITAMIN B-12)) injection 1,000 mcg  1,000 mcg Subcutaneous Q30 days [name], MD   1,000 mcg at 09/08/17 1408    Allergies  Allergen Reactions  . Celecoxib   . Prednisone Itching    Heart rate increased  . Macrodantin [Nitrofurantoin Macrocrystal] Rash  . Penicillins Hives    Per family Has patient had a PCN reaction causing immediate rash, facial/tongue/throat swelling, SOB or lightheadedness with hypotension: Unknown Has patient had a PCN reaction causing severe rash involving mucus membranes or skin necrosis: Unknown Has patient had a PCN reaction that required hospitalization: No Has patient had a PCN reaction occurring within the last 10 years: No If all of the above answers are "NO", then [place] proceed with Cephalosporin use.     Social History   Socioeconomic History  . Marital status: Married    Spouse name: Not on file  . Number of children: Not on file  . Years of education: Not on file  . Highest education level: Not  on file  Occupational History  . Not on file  Social Needs  . Financial resource strain: Not on file  . Food insecurity:    Worry: Not on file    Inability: Not on file  . Transportation needs:    Medical: Not on file    Non-medical: Not on file  Tobacco Use  . Smoking status: Current Every Day Smoker    Packs/day: 0.50    Types: Cigarettes  . Smokeless tobacco: Never Used  Substance and Sexual Activity  . Alcohol use: No  . Drug use: No  . Sexual activity: Not Currently  Lifestyle  . Physical activity:    Days per week: Not on file    Minutes per session: Not on file  . Stress: Not on file  Relationships  . Social connections:    Talks on phone: Not on file  Gets together: Not on file    Attends religious service: Not on file    Active member of club or organization: Not on file    Attends meetings of clubs or organizations: Not on file    Relationship status: Not on file  . Intimate partner violence:    Fear of current or ex partner: Not on file    Emotionally abused: Not on file    Physically abused: Not on file    Forced sexual activity: Not on file  Other Topics Concern  . Not on file  Social History Narrative  . Not on file    Family History  Problem Relation Age of Onset  . Lung cancer Father   . Heart disease Father        MI at age 64  . Alcohol abuse Father   . Alzheimer's disease Mother   . Dementia Mother   . Other Mother        twisted colon   . Stomach cancer Neg Hx   . Colon cancer Neg Hx     Review of Systems:  As stated in the HPI and otherwise negative.   BP 134/60   Pulse 60   Ht 5' 4.5" (1.638 m)   Wt 210 lb 6.4 oz (95.4 kg)   SpO2 96%   BMI 35.56 kg/m   Physical Examination:  General: Well developed, well nourished, NAD  HEENT: OP clear, mucus membranes moist  SKIN: warm, dry. No rashes. Neuro: No focal deficits  Musculoskeletal: Muscle strength 5/5 all ext  Psychiatric: Mood and affect normal  Neck: No JVD, no carotid  bruits, no thyromegaly, no lymphadenopathy.  Lungs:Clear bilaterally, no wheezes, rhonci, crackles Cardiovascular: Regular rate and rhythm. Loud harsh systolic murmur.  Abdomen:Soft. Bowel sounds present. Non-tender.  Extremities: No lower extremity edema. Pulses are 2 + in the bilateral DP/PT.  Echo 01/27/18: Left ventricle: The cavity size was mildly dilated. Wall   thickness was increased in a pattern of mild LVH. Systolic   function was normal. The estimated ejection fraction was in the   range of 60% to 65%. Wall motion was normal; there were no   regional wall motion abnormalities. Doppler parameters are   consistent with abnormal left ventricular relaxation (grade 1   diastolic dysfunction). Doppler parameters are consistent with   high ventricular filling pressure. - Aortic valve: Valve mobility was restricted. There was moderate   to severe stenosis. There was no regurgitation. Peak velocity   (S): 400 cm/s. Mean gradient (S): 37 mm Hg. Peak gradient (S): 64   mm Hg. - Mitral valve: Transvalvular velocity was within the normal range.   There was no evidence for stenosis. There was no regurgitation. - Left atrium: The atrium was mildly dilated. - Right ventricle: The cavity size was normal. Wall thickness was   normal. Systolic function was normal. - Tricuspid valve: There was mild regurgitation. - Pulmonary arteries: Systolic pressure was within the normal   range. PA peak pressure: 31 mm Hg (S). - Pericardium, extracardiac: A trivial pericardial effusion was   identified. - Global longitudinal strain -14.3% (abnormal).  Impressions:  - Compared with the echo 07/2017, the aortic stenosis severity is   unchanged. An acending aorta aneurysm was not appreciated.  EKG:  EKG is not ordered today. The ekg ordered today demonstrates   Recent Labs: 11/28/2017: TSH 1.08 12/23/2017: Brain Natriuretic Peptide 35 03/10/2018: ALT 8; BUN 13; Creatinine, Ser 0.97; Potassium 3.7;  Sodium 142 05/05/2018: Hemoglobin  12.9; Platelets 104.0   Lipid Panel    Component Value Date/Time   CHOL 172 09/08/2017 1204   TRIG 111 09/08/2017 1204   HDL 54 09/08/2017 1204   CHOLHDL 3.2 09/08/2017 1204   VLDL 20 03/26/2017 0908   LDLCALC 97 09/08/2017 1204     Wt Readings from Last 3 Encounters:  05/15/18 210 lb 6.4 oz (95.4 kg)  05/07/18 206 lb (93.4 kg)  05/05/18 206 lb (93.4 kg)     Other studies Reviewed: Additional studies/ records that were reviewed today include: . Review of the above records demonstrates:    Assessment and Plan:   1. Aortic stenosis: She has moderately severe AS by echo June 2019. I do not think she is having symptoms at this time from her valve disease. I will repeat an echo in January 2019. If her valve disease progresses or if she starts to have worrisome symptoms, will discuss TAVR.   2. Tobacco abuse: I have asked her to stop smoking  Current medicines are reviewed at length with the patient today.  The patient does not have concerns regarding medicines.  The following changes have been made:  no change  Labs/ tests ordered today include:   Orders Placed This Encounter  Procedures  . ECHOCARDIOGRAM COMPLETE     Disposition:   FU with me in 6  months   Signed, [name], MD 05/15/2018 2:44 PM    [address], [place], [place]  [address] Phone: [phone]; Fax: [phone]

## 2018-05-15 ENCOUNTER — Encounter: Payer: Self-pay | Admitting: Cardiovascular Disease

## 2018-05-15 ENCOUNTER — Ambulatory Visit: Payer: Medicare HMO | Admitting: Cardiovascular Disease

## 2018-05-15 VITALS — BP 134/60 | HR 60 | Ht 64.5 in | Wt 210.4 lb

## 2018-05-15 DIAGNOSIS — I350 Nonrheumatic aortic (valve) stenosis: Secondary | ICD-10-CM

## 2018-05-15 NOTE — Patient Instructions (Signed)
Medication Instructions:  Your physician recommends that you continue on your current medications as directed. Please refer to the Current Medication list given to you today.  If you need a refill on your cardiac medications before your next appointment, please call your pharmacy.   Lab work: none If you have labs (blood work) drawn today and your tests are completely normal, you will receive your results only by: Marland Kitchen MyChart Message (if you have MyChart) OR . A paper copy in the mail If you have any lab test that is abnormal or we need to change your treatment, we will call you to review the results.  Testing/Procedures: Your physician has requested that you have an echocardiogram. Echocardiography is a painless test that uses sound waves to create images of your heart. It provides your doctor with information about the size and shape of your heart and how well your heart's chambers and valves are working. This procedure takes approximately one hour. There are no restrictions for this procedure.  To be done in January 2020  Follow-Up: Your physician recommends that you schedule a follow-up appointment in: January --week or so after echo.

## 2018-05-21 ENCOUNTER — Ambulatory Visit (INDEPENDENT_AMBULATORY_CARE_PROVIDER_SITE_OTHER): Payer: Medicare HMO | Admitting: Family Medicine

## 2018-05-21 ENCOUNTER — Encounter: Payer: Self-pay | Admitting: Family Medicine

## 2018-05-21 VITALS — BP 130/74 | HR 54 | Temp 97.7°F | Resp 16 | Ht 64.5 in | Wt 208.0 lb

## 2018-05-21 DIAGNOSIS — I10 Essential (primary) hypertension: Secondary | ICD-10-CM | POA: Diagnosis not present

## 2018-05-21 DIAGNOSIS — E118 Type 2 diabetes mellitus with unspecified complications: Secondary | ICD-10-CM | POA: Diagnosis not present

## 2018-05-21 NOTE — Progress Notes (Signed)
Subjective:    Patient ID: Kelly [name], female    DOB: 27-Oct-1950, 67 y.o.   MRN: [id number] 05/05/18 Patient was referred here from her gastroenterologist office due to elevated blood pressure.  Her blood pressure was 220/90.  Here upon arrival her blood pressure is 190/98.  I rechecked this and verified it myself.  Patient denies any chest pain.  She denies any shortness of breath.  She denies any headache.  She denies any blurry vision.  She denies any oliguria or hematuria.  There is no evidence of endorgan damage to suggest hypertensive emergency.  I did perform an EKG today which shows T wave inversions in the lateral leads I, aVL, V4, V5, and V6.  This is concerning for lateral ischemia.  However these T wave inversions were present on her EKG from February of this year and appear to be chronic for this patient.  Today she denies any chest pain or shortness of breath.  After sitting in our office relaxing for approximately 30 minutes, her blood pressure is fallen to 180/80.  While still elevated, this is reassuring.  She believes a lot of her blood pressure is elevated this morning due to stress and anxiety.  She has an appointment next week to see her cardiologist to discuss surgical repair of her aortic valve.  This has her extremely worried.  At that time, my plan was: EKG today shows no change from her previous EKG in February.  Her symptoms are reassuring.  Her blood pressure has fallen substantially since this morning and is now down to 180/80.  I will add amlodipine 10 mg p.o. daily to her current medication and recheck her blood pressure here in 48 hours.  She is instructed to go to the hospital immediately if she develops chest pain, shortness of breath, palpitations, severe headache, confusion, etc.  Patient understands and is comfortable with the plan.  I will see the patient back in 48 hours to recheck her blood pressure on the medication. 05/07/18 Since taking the amlodipine, her blood  pressure has improved.  However I got her blood pressure higher than reported.  When I checked her blood pressure was 158/80.  This is still much better than it was earlier this week and the patient is only been on the medication for 2 days.  She denies any chest pain shortness of breath or dyspnea on exertion.  She denies any headaches or blurry vision.  Bradycardia is slightly worse on the amlodipine coupled with the propranolol but still acceptable.  At that time, my plan was: Patient's blood pressure is much safer now than earlier this week.  I would give 2 weeks for the amlodipine to take full effect and then recheck her blood pressure.  If still elevated at that time, we [place] need to add hydrochlorothiazide.  Her bradycardia will need to be monitored with the amlodipine in addition to the propranolol.  However the present time I believe her heart rate of 58 is acceptable.  05/21/18 She is here today to recheck her blood pressure.  Blood pressure is now much better controlled at 130/74.  She denies any chest pain shortness of breath or dyspnea on exertion despite her moderate to severe aortic stenosis.  Her heart rate today is 54 bpm.  Last week it was 58 bpm however she denies any dizziness or syncope or presyncope.  Overall she is doing well.  It has been 6 months since we monitored her diabetes with hemoglobin A1c.  She just had a transfusion and her hemoglobin is greater than 12.  She is due to see her hematologist in 1 month to repeat this and monitor this.  She recently met with cardiology who is postpone any valve replacement.  He will see her in January to evaluate further because at the present time the patient is asymptomatic.   Past Medical History:  Diagnosis Date  . Acute encephalopathy 09/14/2016  . Adenomatous polyp   . Anxiety   . Aortic stenosis   . Arrhythmia   . Chronic upper GI bleeding    intestinal AVM's, portal gastropathy, esophageal varices on egd 04/2018  . Dementia ([place])  09/16/2016  . Diabetes ([place])   . DIVERTICULITIS OF COLON 06/14/2008   Qualifier: Diagnosis of  By: [name]    . Diverticulosis   . DM (diabetes mellitus), type 2 ([place]) 09/15/2016  . Esophageal varices determined by endoscopy ([place])   . Fatty liver   . Hyperlipidemia   . Hypertension   . Irritable bowel syndrome 04/06/2008   Centricity Description: IBS Qualifier: Diagnosis of  By: [name] CMA ([name]), [name]   Centricity Description: IRRITABLE BOWEL SYNDROME Qualifier: Diagnosis of  By: [name] MD, [name]   . Liver cirrhosis secondary to NASH ([place]) 09/16/2016  . NASH (nonalcoholic steatohepatitis)   . Obesity   . Portal hypertensive gastropathy ([place])   . SOB (shortness of breath)   . Splenomegaly 09/16/2016  . Thrombocytopenia ([place])   . Thyroid disease   . Vitamin B 12 deficiency 09/16/2016  . Vitamin D deficiency    Past Surgical History:  Procedure Laterality Date  . COLONOSCOPY WITH PROPOFOL N/A 04/09/2018   Procedure: COLONOSCOPY WITH PROPOFOL;  Surgeon: [name], MD;  [facility]: [facility];  Service: Endoscopy;  Laterality: N/A;  . ESOPHAGOGASTRODUODENOSCOPY (EGD) WITH PROPOFOL N/A 04/09/2018   Procedure: ESOPHAGOGASTRODUODENOSCOPY (EGD) WITH PROPOFOL;  Surgeon: [name], MD;  [facility];  Service: Endoscopy;  Laterality: N/A;  . oopherectomy    . OVARIAN CYST REMOVAL    . POLYPECTOMY  04/09/2018   Procedure: POLYPECTOMY;  Surgeon: [name], MD;  Location: [facility];  Service: Endoscopy;;   Current Outpatient Medications on File Prior to Visit  Medication Sig Dispense Refill  . ACCU-CHEK AVIVA PLUS test strip     . ACCU-CHEK SOFTCLIX LANCETS lancets     . amLODipine (NORVASC) 10 MG tablet Take 1 tablet (10 mg total) by mouth daily. 90 tablet 3  . atorvastatin (LIPITOR) 20 MG tablet TAKE 1 TABLET (20 MG TOTAL) BY MOUTH DAILY. 90 tablet 3  . Blood Glucose Monitoring Suppl (ACCU-CHEK AVIVA PLUS) w/Device KIT     . ferrous sulfate 325 (65 FE) MG  tablet Take 1 tablet (325 mg total) by mouth daily. 90 tablet 3  . furosemide (LASIX) 20 MG tablet Take 1 tablet (20 mg total) by mouth daily. 90 tablet 3  . levothyroxine (SYNTHROID, LEVOTHROID) 75 MCG tablet Take 1 tablet (75 mcg total) by mouth daily. 90 tablet 3  . losartan (COZAAR) 100 MG tablet Take 1 tablet (100 mg total) by mouth daily. Stop lisinopril hctz 90 tablet 3  . metFORMIN (GLUCOPHAGE-XR) 500 MG 24 hr tablet TAKE 2 TABLETS TWICE DAILY 360 tablet 1  . pantoprazole (PROTONIX) 40 MG tablet Take 1 tablet (40 mg total) by mouth daily. 30 tablet 3  . polyethylene glycol-electrolytes (NULYTELY/GOLYTELY) 420 g solution Take 4,000 mLs by mouth as directed. 4000 mL 0  . potassium chloride  SA (K-DUR,KLOR-CON) 20 MEQ tablet Take 1 tablet (20 mEq total) by mouth daily. 90 tablet 3  . propranolol (INDERAL) 40 MG tablet Take 1 tablet (40 mg total) by mouth 2 (two) times daily. 180 tablet 0  . traZODone (DESYREL) 50 MG tablet TAKE 1 TABLET AT BEDTIME AS NEEDED FOR SLEEP 90 tablet 1  . zolpidem (AMBIEN) 5 MG tablet TAKE 1 TABLET BY MOUTH EVERY DAY AT BEDTIME AS NEEDED FOR SLEEP (Patient taking differently: Take 5 mg by mouth at bedtime as needed for sleep. ) 30 tablet 2   Current Facility-Administered Medications on File Prior to Visit  Medication Dose Route Frequency Provider Last Rate Last Dose  . cyanocobalamin ((VITAMIN B-12)) injection 1,000 mcg  1,000 mcg Subcutaneous Q30 days [name], MD   1,000 mcg at 09/08/17 1408   Allergies  Allergen Reactions  . Celecoxib   . Prednisone Itching    Heart rate increased  . Macrodantin [Nitrofurantoin Macrocrystal] Rash  . Penicillins Hives    Per family Has patient had a PCN reaction causing immediate rash, facial/tongue/throat swelling, SOB or lightheadedness with hypotension: Unknown Has patient had a PCN reaction causing severe rash involving mucus membranes or skin necrosis: Unknown Has patient had a PCN reaction that required  hospitalization: No Has patient had a PCN reaction occurring within the last 10 years: No If all of the above answers are "NO", then [place] proceed with Cephalosporin use.    Social History   Socioeconomic History  . Marital status: Married    Spouse name: Not on file  . Number of children: Not on file  . Years of education: Not on file  . Highest education level: Not on file  Occupational History  . Not on file  Social Needs  . Financial resource strain: Not on file  . Food insecurity:    Worry: Not on file    Inability: Not on file  . Transportation needs:    Medical: Not on file    Non-medical: Not on file  Tobacco Use  . Smoking status: Current Every Day Smoker    Packs/day: 0.50    Types: Cigarettes  . Smokeless tobacco: Never Used  Substance and Sexual Activity  . Alcohol use: No  . Drug use: No  . Sexual activity: Not Currently  Lifestyle  . Physical activity:    Days per week: Not on file    Minutes per session: Not on file  . Stress: Not on file  Relationships  . Social connections:    Talks on phone: Not on file    Gets together: Not on file    Attends religious service: Not on file    Active member of club or organization: Not on file    Attends meetings of clubs or organizations: Not on file    Relationship status: Not on file  . Intimate partner violence:    Fear of current or ex partner: Not on file    Emotionally abused: Not on file    Physically abused: Not on file    Forced sexual activity: Not on file  Other Topics Concern  . Not on file  Social History Narrative  . Not on file   Family History  Problem Relation Age of Onset  . Lung cancer Father   . Heart disease Father        MI at age 2  . Alcohol abuse Father   . Alzheimer's disease Mother   . Dementia Mother   .  Other Mother        twisted colon   . Stomach cancer Neg Hx   . Colon cancer Neg Hx       Review of Systems  All other systems reviewed and are negative.        Objective:   Physical Exam  Constitutional: She is oriented to person, place, and time. She appears well-developed and well-nourished.  HENT:  Right Ear: External ear normal.  Left Ear: External ear normal.  Nose: Nose normal.  Mouth/Throat: Oropharynx is clear and moist. No oropharyngeal exudate.  Eyes: Pupils are equal, round, and reactive to light. Conjunctivae and EOM are normal.  Neck: Neck supple. No JVD present. No thyromegaly present.  Cardiovascular: Normal rate and regular rhythm.  Murmur heard.  Systolic murmur is present with a grade of 3/6. Pulmonary/Chest: Effort normal. No respiratory distress. She has no wheezes. She has no rales.  Abdominal: Soft. Bowel sounds are normal. She exhibits no distension. There is no tenderness. There is no rebound and no guarding.  Musculoskeletal: She exhibits no edema.  Lymphadenopathy:    She has no cervical adenopathy.  Neurological: She is alert and oriented to person, place, and time. She has normal reflexes. No cranial nerve deficit. She exhibits normal muscle tone. Coordination normal.  Psychiatric: She has a normal mood and affect. Her behavior is normal. Judgment and thought content normal.  Vitals reviewed.         Assessment & Plan:  Controlled type 2 diabetes mellitus with complication, without long-term current use of insulin ([place]) - Plan: Hemoglobin A1c, COMPLETE METABOLIC PANEL WITH GFR  Benign essential HTN  Blood pressures adequately controlled.  Patient is bradycardic however she is asymptomatic.  Therefore we will continue amlodipine at the present time.  If the patient becomes dizzy or lightheaded she knows to call me and we [place] need to discontinue amlodipine and switch to hydrochlorothiazide in case bradycardia worsens.  I will obtain a hemoglobin A1c today to monitor her diabetes.  Otherwise I recommended that we recheck the patient in January or February unless symptoms develop in the meantime.

## 2018-05-22 ENCOUNTER — Telehealth: Payer: Self-pay | Admitting: Family Medicine

## 2018-05-22 LAB — COMPLETE METABOLIC PANEL WITH GFR
AG RATIO: 1.5 (calc) (ref 1.0–2.5)
ALBUMIN MSPROF: 3.8 g/dL (ref 3.6–5.1)
ALT: 13 U/L (ref 6–29)
AST: 18 U/L (ref 10–35)
Alkaline phosphatase (APISO): 89 U/L (ref 33–130)
BILIRUBIN TOTAL: 0.6 mg/dL (ref 0.2–1.2)
BUN: 12 mg/dL (ref 7–25)
CHLORIDE: 108 mmol/L (ref 98–110)
CO2: 25 mmol/L (ref 20–32)
Calcium: 9 mg/dL (ref 8.6–10.4)
Creat: 0.78 mg/dL (ref 0.50–0.99)
GFR, EST AFRICAN AMERICAN: 90 mL/min/{1.73_m2} (ref >=60)
GFR, Est Non African American: 78 mL/min/{1.73_m2} (ref >=60)
GLUCOSE: 97 mg/dL (ref 65–99)
Globulin: 2.5 g/dL (calc) (ref 1.9–3.7)
Potassium: 4.2 mmol/L (ref 3.5–5.3)
Sodium: 141 mmol/L (ref 135–146)
TOTAL PROTEIN: 6.3 g/dL (ref 6.1–8.1)

## 2018-05-22 LAB — HEMOGLOBIN A1C
Hgb A1c MFr Bld: 5.3 % of total Hgb (ref <5.7)
MEAN PLASMA GLUCOSE: 105 (calc)
eAG (mmol/L): 5.8 (calc)

## 2018-05-22 MED ORDER — METRONIDAZOLE 500 MG PO TABS: ORAL_TABLET | ORAL | 0 refills | Status: DC

## 2018-05-22 MED ORDER — CIPROFLOXACIN HCL 500 MG PO TABS: 500.0000 mg | ORAL_TABLET | Freq: Two times a day (BID) | ORAL | 0 refills | Status: DC

## 2018-05-22 NOTE — Telephone Encounter (Signed)
Is she having severe LLQ abd pain and diarrhea?  If so, use cipro 500 bid and flagyl 500 bid for 10 days for diverticulitis.

## 2018-05-22 NOTE — Telephone Encounter (Signed)
Patient called in stating that you all discussed an antibiotic for an infection at appointment on yesterday.She says she was to call in if she felt she needed it. Please advise? Also she is requesting a refill on her [place].

## 2018-05-22 NOTE — Telephone Encounter (Signed)
Patient would like to know if we can refill her ambien?

## 2018-05-22 NOTE — Telephone Encounter (Signed)
Patient stated she is having diarrhea and lower abdominal pain. Meds sent in. Patient notified

## 2018-05-27 ENCOUNTER — Other Ambulatory Visit: Payer: Self-pay | Admitting: Family Medicine

## 2018-05-27 DIAGNOSIS — F330 Major depressive disorder, recurrent, mild: Secondary | ICD-10-CM

## 2018-05-27 NOTE — Telephone Encounter (Signed)
Requesting refill - Ambien      LOV: 05/21/18  LRF:  02/26/18

## 2018-06-02 ENCOUNTER — Ambulatory Visit: Payer: Self-pay | Admitting: Gastroenterology

## 2018-06-29 ENCOUNTER — Other Ambulatory Visit: Payer: Self-pay | Admitting: Family Medicine

## 2018-06-29 ENCOUNTER — Other Ambulatory Visit: Payer: Self-pay | Admitting: Gastroenterology

## 2018-06-29 ENCOUNTER — Telehealth: Payer: Self-pay | Admitting: Oncology

## 2018-06-29 NOTE — Telephone Encounter (Signed)
Patient called to reschedule

## 2018-07-03 ENCOUNTER — Inpatient Hospital Stay: Payer: Medicare HMO

## 2018-07-03 ENCOUNTER — Inpatient Hospital Stay: Payer: Medicare HMO | Admitting: Oncology

## 2018-07-13 ENCOUNTER — Other Ambulatory Visit (INDEPENDENT_AMBULATORY_CARE_PROVIDER_SITE_OTHER): Payer: Medicare HMO

## 2018-07-13 ENCOUNTER — Ambulatory Visit: Payer: Medicare HMO | Admitting: Gastroenterology

## 2018-07-13 ENCOUNTER — Encounter: Payer: Self-pay | Admitting: Gastroenterology

## 2018-07-13 VITALS — BP 178/90 | HR 60 | Ht 64.25 in | Wt 211.4 lb

## 2018-07-13 DIAGNOSIS — K766 Portal hypertension: Secondary | ICD-10-CM

## 2018-07-13 DIAGNOSIS — Z86010 Personal history of colonic polyps: Secondary | ICD-10-CM

## 2018-07-13 DIAGNOSIS — K7460 Unspecified cirrhosis of liver: Secondary | ICD-10-CM | POA: Diagnosis not present

## 2018-07-13 DIAGNOSIS — K7581 Nonalcoholic steatohepatitis (NASH): Secondary | ICD-10-CM

## 2018-07-13 LAB — COMPREHENSIVE METABOLIC PANEL
ALT: 14 U/L (ref 0–35)
AST: 23 U/L (ref 0–37)
Albumin: 3.7 g/dL (ref 3.5–5.2)
Alkaline Phosphatase: 96 U/L (ref 39–117)
BUN: 15 mg/dL (ref 6–23)
CO2: 27 meq/L (ref 19–32)
Calcium: 9.1 mg/dL (ref 8.4–10.5)
Chloride: 108 mEq/L (ref 96–112)
Creatinine, Ser: 0.76 mg/dL (ref 0.40–1.20)
GFR: 80.02 mL/min (ref >60.00)
GLUCOSE: 159 mg/dL — AB (ref 70–99)
POTASSIUM: 3.8 meq/L (ref 3.5–5.1)
Sodium: 142 mEq/L (ref 135–145)
Total Bilirubin: 0.8 mg/dL (ref 0.2–1.2)
Total Protein: 6.8 g/dL (ref 6.0–8.3)

## 2018-07-13 LAB — CBC WITH DIFFERENTIAL/PLATELET
BASOS PCT: 1.3 % (ref 0.0–3.0)
Basophils Absolute: 0.1 10*3/uL (ref 0.0–0.1)
EOS PCT: 4.3 % (ref 0.0–5.0)
Eosinophils Absolute: 0.3 10*3/uL (ref 0.0–0.7)
HEMATOCRIT: 34.6 % — AB (ref 36.0–46.0)
HEMOGLOBIN: 12.1 g/dL (ref 12.0–15.0)
LYMPHS PCT: 26.5 % (ref 12.0–46.0)
Lymphs Abs: 1.9 10*3/uL (ref 0.7–4.0)
MCHC: 35 g/dL (ref 30.0–36.0)
MCV: 87 fl (ref 78.0–100.0)
MONOS PCT: 10 % (ref 3.0–12.0)
Monocytes Absolute: 0.7 10*3/uL (ref 0.1–1.0)
Neutro Abs: 4.2 10*3/uL (ref 1.4–7.7)
Neutrophils Relative %: 57.9 % (ref 43.0–77.0)
Platelets: 81 10*3/uL — ABNORMAL LOW (ref 150.0–400.0)
RBC: 3.97 Mil/uL (ref 3.87–5.11)
RDW: 14.7 % (ref 11.5–15.5)
WBC: 7.3 10*3/uL (ref 4.0–10.5)

## 2018-07-13 LAB — PROTIME-INR
INR: 1.1 ratio — ABNORMAL HIGH (ref 0.8–1.0)
Prothrombin Time: 12.7 s (ref 9.6–13.1)

## 2018-07-13 NOTE — Progress Notes (Signed)
Kelly [name] of gastrointestinal problems:  1. small tubular adenoma in colon,removed September, 2009. Next colonoscopy September 2014  2. left-sided diverticulosiswith diverticular associated edema noted by colonoscopy 2009  3. abnormal liver tests noted July, 2010: Likely from NASH Blood work August, 2010: Hepatitis A., B., C. were all negative; alpha one antitrypsin level normal, ceruloplasmin level normal, AMA negative, AMA negative, iron studies normal, anti-smooth muscle antibody negative, AST and ALT in 50-70 range; TTG negative, IgA level normal. Liver ultrasound suggested fatty liver disease.  [place] 01/2018 "mild cirrhosis" without mass lesion ([place] 09/2016 "suspected cirrhosis, no focal hepatic lesion, + splenomegaly")  MELD 7 2019 labs  EGD 04/2018 medium to large esophageal varices, mod to severe portal gastropathy. Started propranolol 4. Change in bowels, minor bleeding; Colonoscopy [place] 2014: The colonic mucosa was slightly erythematous throughout. Biopsies were taken randomly and sent to pathology. There were multiple diverticulum in the left colon. The terminal ileum was normal. No polyps or cancers.  5. History of polyps: Colonoscopy 04/2018 Dr. [name]: single small TA removed, recall in 5 years.   HPI: This is a very pleasant 67 year old Kelly [name] whom I last saw at the time of an EGD and colonoscopy about 3 months ago.  See the both of those results above.  Since the procedures she started propranolol 40 mg twice daily.  She stopped metoprolol, using the propranolol as a substitute.  She sees Dr. [name] who is helping her with other issues, specifically he has been helping with her hypertension lately.  Weight is down two pounds since last visit.  Chief complaint is cirrhosis, hypertension  Had a nose bleed since last night.  Also right ear congestion.  Also had a single bloody BM following constipation, pushing and straining. She took a dulcolax and then had diarrhea.  She does not tend  to be constipated very often.  In fact that is quite rare for her to happen.  ROS: complete GI ROS as described in HPI, all other review negative.  Constitutional:  No unintentional weight loss   Past Medical History:  Diagnosis Date  . Acute encephalopathy 09/14/2016  . Adenomatous polyp   . Anxiety   . Aortic stenosis   . Arrhythmia   . Chronic upper GI bleeding    intestinal AVM's, portal gastropathy, esophageal varices on egd 04/2018  . Dementia ([place]) 09/16/2016  . Diabetes ([place])   . DIVERTICULITIS OF COLON 06/14/2008   Qualifier: Diagnosis of  By: [name]    . Diverticulosis   . DM (diabetes mellitus), type 2 ([place]) 09/15/2016  . Esophageal varices determined by endoscopy ([place])   . Fatty liver   . Hyperlipidemia   . Hypertension   . Irritable bowel syndrome 04/06/2008   Centricity Description: IBS Qualifier: Diagnosis of  By: [name] CMA ([name]), [name]   Centricity Description: IRRITABLE BOWEL SYNDROME Qualifier: Diagnosis of  By: [name] MD, [name]   . Liver cirrhosis secondary to NASH ([place]) 09/16/2016  . NASH (nonalcoholic steatohepatitis)   . Obesity   . Portal hypertensive gastropathy ([place])   . SOB (shortness of breath)   . Splenomegaly 09/16/2016  . Thrombocytopenia ([place])   . Thyroid disease   . Vitamin B 12 deficiency 09/16/2016  . Vitamin D deficiency     Past Surgical History:  Procedure Laterality Date  . COLONOSCOPY WITH PROPOFOL N/A 04/09/2018   Procedure: COLONOSCOPY WITH PROPOFOL;  Surgeon: [name], MD;  Location: [facility];  Service: Endoscopy;  Laterality: N/A;  . ESOPHAGOGASTRODUODENOSCOPY (EGD)  WITH PROPOFOL N/A 04/09/2018   Procedure: ESOPHAGOGASTRODUODENOSCOPY (EGD) WITH PROPOFOL;  Surgeon: [name], MD;  [facility];  Service: Endoscopy;  Laterality: N/A;  . oopherectomy    . OVARIAN CYST REMOVAL    . POLYPECTOMY  04/09/2018   Procedure: POLYPECTOMY;  Surgeon: [name], MD;  Location: [facility];  Service:  Endoscopy;;    Current Outpatient Medications  Medication Sig Dispense Refill  . ACCU-CHEK AVIVA PLUS test strip     . ACCU-CHEK SOFTCLIX LANCETS lancets     . amLODipine (NORVASC) 10 MG tablet Take 1 tablet (10 mg total) by mouth daily. 90 tablet 3  . atorvastatin (LIPITOR) 20 MG tablet TAKE 1 TABLET (20 MG TOTAL) BY MOUTH DAILY. 90 tablet 3  . Blood Glucose Monitoring Suppl (ACCU-CHEK AVIVA PLUS) w/Device KIT     . ferrous sulfate 325 (65 FE) MG tablet Take 1 tablet (325 mg total) by mouth daily. 90 tablet 3  . furosemide (LASIX) 20 MG tablet Take 1 tablet (20 mg total) by mouth daily. 90 tablet 3  . levothyroxine (SYNTHROID, LEVOTHROID) 75 MCG tablet TAKE 1 TABLET EVERY DAY 90 tablet 3  . losartan (COZAAR) 100 MG tablet Take 1 tablet (100 mg total) by mouth daily. Stop lisinopril hctz 90 tablet 3  . metFORMIN (GLUCOPHAGE-XR) 500 MG 24 hr tablet TAKE 2 TABLETS TWICE DAILY 360 tablet 1  . pantoprazole (PROTONIX) 40 MG tablet Take 1 tablet (40 mg total) by mouth daily. 30 tablet 3  . potassium chloride SA (K-DUR,KLOR-CON) 20 MEQ tablet Take 1 tablet (20 mEq total) by mouth daily. 90 tablet 3  . propranolol (INDERAL) 40 MG tablet TAKE 1 TABLET TWICE DAILY 180 tablet 0  . traZODone (DESYREL) 50 MG tablet TAKE 1 TABLET AT BEDTIME AS NEEDED FOR SLEEP 90 tablet 1  . zolpidem (AMBIEN) 5 MG tablet TAKE 1 TABLET BY MOUTH EVERY DAY AT BEDTIME AS NEEDED FOR SLEEP 30 tablet 2   Current Facility-Administered Medications  Medication Dose Route Frequency Provider Last Rate Last Dose  . cyanocobalamin ((VITAMIN B-12)) injection 1,000 mcg  1,000 mcg Subcutaneous Q30 days [name], MD   1,000 mcg at 09/08/17 1408    Allergies as of 07/13/2018 - Review Complete 07/13/2018  Allergen Reaction Noted  . Celecoxib    . Prednisone Itching 01/13/2013  . Macrodantin [nitrofurantoin macrocrystal] Rash 06/03/2016  . Penicillins Hives 06/03/2016    Family History  Problem Relation Age of Onset  .  Lung cancer Father   . Heart disease Father        MI at age 52  . Alcohol abuse Father   . Alzheimer's disease Mother   . Dementia Mother   . Other Mother        twisted colon   . Stomach cancer Neg Hx   . Colon cancer Neg Hx     Social History   Socioeconomic History  . Marital status: Married    Spouse name: Not on file  . Number of children: Not on file  . Years of education: Not on file  . Highest education level: Not on file  Occupational History  . Not on file  Social Needs  . Financial resource strain: Not on file  . Food insecurity:    Worry: Not on file    Inability: Not on file  . Transportation needs:    Medical: Not on file    Non-medical: Not on file  Tobacco Use  . Smoking status:  Current Every Day Smoker    Packs/day: 0.50    Types: Cigarettes  . Smokeless tobacco: Never Used  Substance and Sexual Activity  . Alcohol use: No  . Drug use: No  . Sexual activity: Not Currently  Lifestyle  . Physical activity:    Days per week: Not on file    Minutes per session: Not on file  . Stress: Not on file  Relationships  . Social connections:    Talks on phone: Not on file    Gets together: Not on file    Attends religious service: Not on file    Active member of club or organization: Not on file    Attends meetings of clubs or organizations: Not on file    Relationship status: Not on file  . Intimate partner violence:    Fear of current or ex partner: Not on file    Emotionally abused: Not on file    Physically abused: Not on file    Forced sexual activity: Not on file  Other Topics Concern  . Not on file  Social History Narrative  . Not on file     Physical Exam: BP (!) 178/90 (BP Location: Left Arm, Patient Position: Sitting, Cuff Size: Normal)   Pulse 60   Ht 5' 4.25" (1.632 m) Comment: height measured without shoes  Wt 211 lb 6 oz (95.9 kg)   BMI 36.00 kg/m  Constitutional: generally well-appearing Psychiatric: alert and oriented  x3 Abdomen: soft, nontender, nondistended, no obvious ascites, no peritoneal signs, normal bowel sounds No peripheral edema noted in lower extremities  Assessment and plan: 67 y.o. female with cirrhosis  She has clear portal hypertension with esophageal varices and portal gastropathy.  Her iron deficiency anemia responded to iron supplements.  I will recheck her labs today including restaging labs for her liver disease.  CBC, complete metabolic profile, coags and alpha-fetoprotein to screen for hepatoma.  She needs ultrasound for hepatoma screening as well.  Her heart rate is 60 but her blood pressure is still quite elevated.  I will communicate with her primary care physician about where to go with her blood pressure medicines.  She will return to see me in 3 months and sooner if needed.  Please see the "Patient Instructions" section for addition details about the plan.  Owens Loffler, MD [facility] 07/13/2018, 9:42 AM

## 2018-07-13 NOTE — Patient Instructions (Addendum)
You will have labs checked today in the basement lab.  Please head down after you check out with the front desk  (cbc, inr, cmet, AFP).  You will be set up for an ultrasound.  If you are bothered again by constipation, please start citrucel fiber supplement.  Please return to see Dr. [name] in 3 months.  Dr. [name] will communicate with Dr. [name] about your hypertension.  You have been scheduled for an abdominal ultrasound at [facility] (1st floor of hospital) on 07/17/18 at 9am Please arrive 15 minutes prior to your appointment for registration. Make certain not to have anything to eat or drink 6 hours prior to your appointment. Should you need to reschedule your appointment, please contact radiology at [phone]. This test typically takes about 30 minutes to perform.  Thank you for entrusting me with your care and choosing [facility].  Dr [name]

## 2018-07-14 ENCOUNTER — Ambulatory Visit (INDEPENDENT_AMBULATORY_CARE_PROVIDER_SITE_OTHER): Payer: Medicare HMO | Admitting: Family Medicine

## 2018-07-14 ENCOUNTER — Encounter: Payer: Self-pay | Admitting: Family Medicine

## 2018-07-14 VITALS — BP 164/100 | HR 54 | Temp 98.2°F | Resp 16 | Ht 64.5 in | Wt 212.0 lb

## 2018-07-14 DIAGNOSIS — I10 Essential (primary) hypertension: Secondary | ICD-10-CM | POA: Diagnosis not present

## 2018-07-14 LAB — AFP TUMOR MARKER: AFP-Tumor Marker: 5.3 ng/mL

## 2018-07-14 NOTE — Progress Notes (Signed)
Subjective:    Patient ID: Kelly [name], female    DOB: 27-Oct-1950, 67 y.o.   MRN: [id number] 05/05/18 Patient was referred here from her gastroenterologist office due to elevated blood pressure.  Her blood pressure was 220/90.  Here upon arrival her blood pressure is 190/98.  I rechecked this and verified it myself.  Patient denies any chest pain.  She denies any shortness of breath.  She denies any headache.  She denies any blurry vision.  She denies any oliguria or hematuria.  There is no evidence of endorgan damage to suggest hypertensive emergency.  I did perform an EKG today which shows T wave inversions in the lateral leads I, aVL, V4, V5, and V6.  This is concerning for lateral ischemia.  However these T wave inversions were present on her EKG from February of this year and appear to be chronic for this patient.  Today she denies any chest pain or shortness of breath.  After sitting in our office relaxing for approximately 30 minutes, her blood pressure is fallen to 180/80.  While still elevated, this is reassuring.  She believes a lot of her blood pressure is elevated this morning due to stress and anxiety.  She has an appointment next week to see her cardiologist to discuss surgical repair of her aortic valve.  This has her extremely worried.  At that time, my plan was: EKG today shows no change from her previous EKG in February.  Her symptoms are reassuring.  Her blood pressure has fallen substantially since this morning and is now down to 180/80.  I will add amlodipine 10 mg p.o. daily to her current medication and recheck her blood pressure here in 48 hours.  She is instructed to go to the hospital immediately if she develops chest pain, shortness of breath, palpitations, severe headache, confusion, etc.  Patient understands and is comfortable with the plan.  I will see the patient back in 48 hours to recheck her blood pressure on the medication. 05/07/18 Since taking the amlodipine, her blood  pressure has improved.  However I got her blood pressure higher than reported.  When I checked her blood pressure was 158/80.  This is still much better than it was earlier this week and the patient is only been on the medication for 2 days.  She denies any chest pain shortness of breath or dyspnea on exertion.  She denies any headaches or blurry vision.  Bradycardia is slightly worse on the amlodipine coupled with the propranolol but still acceptable.  At that time, my plan was: Patient's blood pressure is much safer now than earlier this week.  I would give 2 weeks for the amlodipine to take full effect and then recheck her blood pressure.  If still elevated at that time, we Ken need to add hydrochlorothiazide.  Her bradycardia will need to be monitored with the amlodipine in addition to the propranolol.  However the present time I believe her heart rate of 58 is acceptable.  05/21/18 She is here today to recheck her blood pressure.  Blood pressure is now much better controlled at 130/74.  She denies any chest pain shortness of breath or dyspnea on exertion despite her moderate to severe aortic stenosis.  Her heart rate today is 54 bpm.  Last week it was 58 bpm however she denies any dizziness or syncope or presyncope.  Overall she is doing well.  It has been 6 months since we monitored her diabetes with hemoglobin A1c.  She just had a transfusion and her hemoglobin is greater than 12.  She is due to see her hematologist in 1 month to repeat this and monitor this.  She recently met with cardiology who is postpone any valve replacement.  He will see her in January to evaluate further because at the present time the patient is asymptomatic.  At that time, my plan was: Blood pressures adequately controlled.  Patient is bradycardic however she is asymptomatic.  Therefore we will continue amlodipine at the present time.  If the patient becomes dizzy or lightheaded she knows to call me and we [place] need to  discontinue amlodipine and switch to hydrochlorothiazide in case bradycardia worsens.  I will obtain a hemoglobin A1c today to monitor her diabetes.  Otherwise I recommended that we recheck the patient in January or February unless symptoms develop in the meantime.  07/14/18 Dr. [name] contacted me yesterday after the patient's blood pressure was found to be elevated in his office.  Here today, the patient is between 509 and 326 systolic over 90.  This is similar to the readings he had in his office yesterday.  Patient states that she is taking propranolol and losartan.  I had added amlodipine at her last visits due to her elevated blood pressure as dictated above.  However when I asked her if she was taking this now, the patient appears extremely confused and has no recollection of taking that medication.  It appears that she [place] not have refilled this medication after it ran out previously.  The patient is suffering from some mild age-related memory problems.   Past Medical History:  Diagnosis Date  . Acute encephalopathy 09/14/2016  . Adenomatous polyp   . Anxiety   . Aortic stenosis   . Arrhythmia   . Chronic upper GI bleeding    intestinal AVM's, portal gastropathy, esophageal varices on egd 04/2018  . Dementia ([place]) 09/16/2016  . Diabetes ([place])   . DIVERTICULITIS OF COLON 06/14/2008   Qualifier: Diagnosis of  By: [name]    . Diverticulosis   . DM (diabetes mellitus), type 2 ([place]) 09/15/2016  . Esophageal varices determined by endoscopy ([place])   . Fatty liver   . Hyperlipidemia   . Hypertension   . Irritable bowel syndrome 04/06/2008   Centricity Description: IBS Qualifier: Diagnosis of  By: [name] CMA ([name]), [name]   Centricity Description: IRRITABLE BOWEL SYNDROME Qualifier: Diagnosis of  By: [name] MD, [name]   . Liver cirrhosis secondary to NASH ([place]) 09/16/2016  . NASH (nonalcoholic steatohepatitis)   . Obesity   . Portal hypertensive gastropathy ([place])   . SOB (shortness of  breath)   . Splenomegaly 09/16/2016  . Thrombocytopenia ([place])   . Thyroid disease   . Vitamin B 12 deficiency 09/16/2016  . Vitamin D deficiency    Past Surgical History:  Procedure Laterality Date  . COLONOSCOPY WITH PROPOFOL N/A 04/09/2018   Procedure: COLONOSCOPY WITH PROPOFOL;  Surgeon: [name], MD;  [facility]: [facility];  Service: Endoscopy;  Laterality: N/A;  . ESOPHAGOGASTRODUODENOSCOPY (EGD) WITH PROPOFOL N/A 04/09/2018   Procedure: ESOPHAGOGASTRODUODENOSCOPY (EGD) WITH PROPOFOL;  Surgeon: [name], MD;  [facility];  Service: Endoscopy;  Laterality: N/A;  . oopherectomy    . OVARIAN CYST REMOVAL    . POLYPECTOMY  04/09/2018   Procedure: POLYPECTOMY;  Surgeon: [name], MD;  Location: [facility];  Service: Endoscopy;;   Current Outpatient Medications on File Prior to Visit  Medication  Sig Dispense Refill  . ACCU-CHEK AVIVA PLUS test strip     . ACCU-CHEK SOFTCLIX LANCETS lancets     . amLODipine (NORVASC) 10 MG tablet Take 1 tablet (10 mg total) by mouth daily. 90 tablet 3  . atorvastatin (LIPITOR) 20 MG tablet TAKE 1 TABLET (20 MG TOTAL) BY MOUTH DAILY. 90 tablet 3  . Blood Glucose Monitoring Suppl (ACCU-CHEK AVIVA PLUS) w/Device KIT     . ferrous sulfate 325 (65 FE) MG tablet Take 1 tablet (325 mg total) by mouth daily. 90 tablet 3  . furosemide (LASIX) 20 MG tablet Take 1 tablet (20 mg total) by mouth daily. 90 tablet 3  . levothyroxine (SYNTHROID, LEVOTHROID) 75 MCG tablet TAKE 1 TABLET EVERY DAY 90 tablet 3  . losartan (COZAAR) 100 MG tablet Take 1 tablet (100 mg total) by mouth daily. Stop lisinopril hctz 90 tablet 3  . pantoprazole (PROTONIX) 40 MG tablet Take 1 tablet (40 mg total) by mouth daily. 30 tablet 3  . potassium chloride SA (K-DUR,KLOR-CON) 20 MEQ tablet Take 1 tablet (20 mEq total) by mouth daily. 90 tablet 3  . propranolol (INDERAL) 40 MG tablet TAKE 1 TABLET TWICE DAILY 180 tablet 0  . traZODone (DESYREL) 50 MG tablet TAKE 1  TABLET AT BEDTIME AS NEEDED FOR SLEEP 90 tablet 1  . zolpidem (AMBIEN) 5 MG tablet TAKE 1 TABLET BY MOUTH EVERY DAY AT BEDTIME AS NEEDED FOR SLEEP 30 tablet 2  . metFORMIN (GLUCOPHAGE-XR) 500 MG 24 hr tablet TAKE 2 TABLETS TWICE DAILY (Patient not taking: Reported on 07/14/2018) 360 tablet 1   Current Facility-Administered Medications on File Prior to Visit  Medication Dose Route Frequency Provider Last Rate Last Dose  . cyanocobalamin ((VITAMIN B-12)) injection 1,000 mcg  1,000 mcg Subcutaneous Q30 days [name], MD   1,000 mcg at 09/08/17 1408   Allergies  Allergen Reactions  . Celecoxib   . Prednisone Itching    Heart rate increased  . Macrodantin [Nitrofurantoin Macrocrystal] Rash  . Penicillins Hives    Per family Has patient had a PCN reaction causing immediate rash, facial/tongue/throat swelling, SOB or lightheadedness with hypotension: Unknown Has patient had a PCN reaction causing severe rash involving mucus membranes or skin necrosis: Unknown Has patient had a PCN reaction that required hospitalization: No Has patient had a PCN reaction occurring within the last 10 years: No If all of the above answers are "NO", then [place] proceed with Cephalosporin use.    Social History   Socioeconomic History  . Marital status: Married    Spouse name: Not on file  . Number of children: Not on file  . Years of education: Not on file  . Highest education level: Not on file  Occupational History  . Not on file  Social Needs  . Financial resource strain: Not on file  . Food insecurity:    Worry: Not on file    Inability: Not on file  . Transportation needs:    Medical: Not on file    Non-medical: Not on file  Tobacco Use  . Smoking status: Current Every Day Smoker    Packs/day: 0.50    Types: Cigarettes  . Smokeless tobacco: Never Used  Substance and Sexual Activity  . Alcohol use: No  . Drug use: No  . Sexual activity: Not Currently  Lifestyle  . Physical activity:      Days per week: Not on file    Minutes per session: Not on file  .  Stress: Not on file  Relationships  . Social connections:    Talks on phone: Not on file    Gets together: Not on file    Attends religious service: Not on file    Active member of club or organization: Not on file    Attends meetings of clubs or organizations: Not on file    Relationship status: Not on file  . Intimate partner violence:    Fear of current or ex partner: Not on file    Emotionally abused: Not on file    Physically abused: Not on file    Forced sexual activity: Not on file  Other Topics Concern  . Not on file  Social History Narrative  . Not on file   Family History  Problem Relation Age of Onset  . Lung cancer Father   . Heart disease Father        MI at age 3  . Alcohol abuse Father   . Alzheimer's disease Mother   . Dementia Mother   . Other Mother        twisted colon   . Stomach cancer Neg Hx   . Colon cancer Neg Hx       Review of Systems  All other systems reviewed and are negative.      Objective:   Physical Exam  Constitutional: She is oriented to person, place, and time. She appears well-developed and well-nourished.  HENT:  Right Ear: External ear normal.  Left Ear: External ear normal.  Nose: Nose normal.  Mouth/Throat: Oropharynx is clear and moist. No oropharyngeal exudate.  Eyes: Pupils are equal, round, and reactive to light. Conjunctivae and EOM are normal.  Neck: Neck supple. No JVD present. No thyromegaly present.  Cardiovascular: Normal rate and regular rhythm.  Murmur heard.  Systolic murmur is present with a grade of 3/6. Pulmonary/Chest: Effort normal. No respiratory distress. She has no wheezes. She has no rales.  Abdominal: Soft. Bowel sounds are normal. She exhibits no distension. There is no tenderness. There is no rebound and no guarding.  Musculoskeletal: She exhibits no edema.  Lymphadenopathy:    She has no cervical adenopathy.   Neurological: She is alert and oriented to person, place, and time. She has normal reflexes. No cranial nerve deficit. She exhibits normal muscle tone. Coordination normal.  Psychiatric: She has a normal mood and affect. Her behavior is normal. Judgment and thought content normal.  Vitals reviewed.         Assessment & Plan:  Benign essential HTN  Patient's blood pressure is extremely high.  Previously it was controlled after the addition of amlodipine.  Requested that the patient drive home immediately and check to see if she is taking amlodipine.  If not, I would resume amlodipine 10 mg a day and recheck her blood pressure later this week to see if there has been improvement.  In October, the amlodipine was able to control her blood pressure.  If she is in fact taking the amlodipine, I would discontinue furosemide and replace it with hydrochlorothiazide.  The patient will contact me back immediately.

## 2018-07-17 ENCOUNTER — Ambulatory Visit (HOSPITAL_COMMUNITY): Payer: Medicare HMO

## 2018-07-22 ENCOUNTER — Ambulatory Visit (HOSPITAL_COMMUNITY)
Admission: RE | Admit: 2018-07-22 | Discharge: 2018-07-22 | Disposition: A | Discharge status: Home / Self Care | Payer: Medicare HMO | Source: Ambulatory Visit | Attending: Gastroenterology | Admitting: Gastroenterology

## 2018-07-22 DIAGNOSIS — R161 Splenomegaly, not elsewhere classified: Secondary | ICD-10-CM | POA: Diagnosis not present

## 2018-07-22 DIAGNOSIS — K7460 Unspecified cirrhosis of liver: Secondary | ICD-10-CM | POA: Insufficient documentation

## 2018-07-22 DIAGNOSIS — K7581 Nonalcoholic steatohepatitis (NASH): Secondary | ICD-10-CM | POA: Diagnosis not present

## 2018-07-30 ENCOUNTER — Inpatient Hospital Stay: Payer: Medicare HMO

## 2018-07-30 ENCOUNTER — Telehealth: Payer: Self-pay | Admitting: Oncology

## 2018-07-30 ENCOUNTER — Inpatient Hospital Stay: Payer: Medicare HMO | Admitting: Oncology

## 2018-07-30 NOTE — Telephone Encounter (Signed)
Called patient per 12/26 sch message - left message for patient to call back if r/s is still needed.

## 2018-07-31 ENCOUNTER — Telehealth: Payer: Self-pay | Admitting: Oncology

## 2018-07-31 NOTE — Telephone Encounter (Signed)
Scheduled appt per 12/26 sch message - pt is aware of appt date and time

## 2018-08-20 ENCOUNTER — Other Ambulatory Visit: Payer: Self-pay | Admitting: Family Medicine

## 2018-08-20 DIAGNOSIS — F330 Major depressive disorder, recurrent, mild: Secondary | ICD-10-CM

## 2018-08-20 NOTE — Telephone Encounter (Signed)
Ok to refill??  Last office visit 07/14/2018.  Last refill 05/28/2018, #2 refills.

## 2018-08-27 ENCOUNTER — Other Ambulatory Visit: Payer: Self-pay

## 2018-08-27 ENCOUNTER — Ambulatory Visit (HOSPITAL_COMMUNITY): Payer: Medicare HMO | Attending: Cardiovascular Disease

## 2018-08-27 DIAGNOSIS — I350 Nonrheumatic aortic (valve) stenosis: Secondary | ICD-10-CM | POA: Diagnosis not present

## 2018-09-02 ENCOUNTER — Telehealth: Payer: Self-pay | Admitting: Cardiovascular Disease

## 2018-09-02 NOTE — Telephone Encounter (Signed)
Pt called to report that has had to cancel her 09/03/2018 appt with Dr. Angelena Form for personal reasons that were out of her control... she is requesting an appt with him in about a month if possible.. I rescheduled her for 09/30/2018 at 3:20pm... pt was very grateful.. I asked how she is feeling and she says that her palpitations have improved some but still having them.. she is very tired but she is due for another FE infusion that she thinks could be what she needs for improvement this would be her second one and it helped her feel better the last time.  I advised her to let us know if she has any worsening of symptoms and if she needs to come in sooner to see an APP on Dr. Camillia Herter team. Pt verbalized understanding and agreed.

## 2018-09-02 NOTE — Telephone Encounter (Signed)
New Message         Patient is cancelling her appt for now,and would like to be rescheduled in a month. There was nothing available in a month, patient asked if she could be fit in. Pls call and advise.

## 2018-09-03 ENCOUNTER — Ambulatory Visit: Payer: Self-pay | Admitting: Cardiovascular Disease

## 2018-09-04 ENCOUNTER — Other Ambulatory Visit: Payer: Self-pay | Admitting: Gastroenterology

## 2018-09-04 ENCOUNTER — Other Ambulatory Visit: Payer: Self-pay | Admitting: Family Medicine

## 2018-09-21 ENCOUNTER — Encounter: Payer: Self-pay | Admitting: Family Medicine

## 2018-09-21 ENCOUNTER — Ambulatory Visit (INDEPENDENT_AMBULATORY_CARE_PROVIDER_SITE_OTHER): Payer: Medicare HMO | Admitting: Family Medicine

## 2018-09-21 VITALS — BP 132/68 | HR 58 | Temp 98.3°F | Resp 16 | Ht 64.5 in | Wt 211.0 lb

## 2018-09-21 DIAGNOSIS — I10 Essential (primary) hypertension: Secondary | ICD-10-CM | POA: Diagnosis not present

## 2018-09-21 DIAGNOSIS — M79672 Pain in left foot: Secondary | ICD-10-CM

## 2018-09-21 DIAGNOSIS — D500 Iron deficiency anemia secondary to blood loss (chronic): Secondary | ICD-10-CM

## 2018-09-21 DIAGNOSIS — E118 Type 2 diabetes mellitus with unspecified complications: Secondary | ICD-10-CM | POA: Diagnosis not present

## 2018-09-21 NOTE — Progress Notes (Signed)
Subjective:    Patient ID: Kelly [name], female    DOB: 27-Oct-1950, 68 y.o.   MRN: [id number] 05/05/18 Patient was referred here from her gastroenterologist office due to elevated blood pressure.  Her blood pressure was 220/90.  Here upon arrival her blood pressure is 190/98.  I rechecked this and verified it myself.  Patient denies any chest pain.  She denies any shortness of breath.  She denies any headache.  She denies any blurry vision.  She denies any oliguria or hematuria.  There is no evidence of endorgan damage to suggest hypertensive emergency.  I did perform an EKG today which shows T wave inversions in the lateral leads I, aVL, V4, V5, and V6.  This is concerning for lateral ischemia.  However these T wave inversions were present on her EKG from February of this year and appear to be chronic for this patient.  Today she denies any chest pain or shortness of breath.  After sitting in our office relaxing for approximately 30 minutes, her blood pressure is fallen to 180/80.  While still elevated, this is reassuring.  She believes a lot of her blood pressure is elevated this morning due to stress and anxiety.  She has an appointment next week to see her cardiologist to discuss surgical repair of her aortic valve.  This has her extremely worried.  At that time, my plan was: EKG today shows no change from her previous EKG in February.  Her symptoms are reassuring.  Her blood pressure has fallen substantially since this morning and is now down to 180/80.  I will add amlodipine 10 mg p.o. daily to her current medication and recheck her blood pressure here in 48 hours.  She is instructed to go to the hospital immediately if she develops chest pain, shortness of breath, palpitations, severe headache, confusion, etc.  Patient understands and is comfortable with the plan.  I will see the patient back in 48 hours to recheck her blood pressure on the medication. 05/07/18 Since taking the amlodipine, her blood  pressure has improved.  However I got her blood pressure higher than reported.  When I checked her blood pressure was 158/80.  This is still much better than it was earlier this week and the patient is only been on the medication for 2 days.  She denies any chest pain shortness of breath or dyspnea on exertion.  She denies any headaches or blurry vision.  Bradycardia is slightly worse on the amlodipine coupled with the propranolol but still acceptable.  At that time, my plan was: Patient's blood pressure is much safer now than earlier this week.  I would give 2 weeks for the amlodipine to take full effect and then recheck her blood pressure.  If still elevated at that time, we [place] need to add hydrochlorothiazide.  Her bradycardia will need to be monitored with the amlodipine in addition to the propranolol.  However the present time I believe her heart rate of 58 is acceptable.  05/21/18 She is here today to recheck her blood pressure.  Blood pressure is now much better controlled at 130/74.  She denies any chest pain shortness of breath or dyspnea on exertion despite her moderate to severe aortic stenosis.  Her heart rate today is 54 bpm.  Last week it was 58 bpm however she denies any dizziness or syncope or presyncope.  Overall she is doing well.  It has been 6 months since we monitored her diabetes with hemoglobin A1c.  She just had a transfusion and her hemoglobin is greater than 12.  She is due to see her hematologist in 1 month to repeat this and monitor this.  She recently met with cardiology who is postpone any valve replacement.  He will see her in January to evaluate further because at the present time the patient is asymptomatic.  At that time, my plan was: Blood pressures adequately controlled.  Patient is bradycardic however she is asymptomatic.  Therefore we will continue amlodipine at the present time.  If the patient becomes dizzy or lightheaded she knows to call me and we [place] need to  discontinue amlodipine and switch to hydrochlorothiazide in case bradycardia worsens.  I will obtain a hemoglobin A1c today to monitor her diabetes.  Otherwise I recommended that we recheck the patient in January or February unless symptoms develop in the meantime.  07/14/18 Dr. [name] contacted me yesterday after the patient's blood pressure was found to be elevated in his office.  Here today, the patient is between 330 and 076 systolic over 90.  This is similar to the readings he had in his office yesterday.  Patient states that she is taking propranolol and losartan.  I had added amlodipine at her last visits due to her elevated blood pressure as dictated above.  However when I asked her if she was taking this now, the patient appears extremely confused and has no recollection of taking that medication.  It appears that she Meisinger not have refilled this medication after it ran out previously.  The patient is suffering from some mild age-related memory problems.  09/21/18 Patient's hemoglobin is doing much better as late as December.  Hemoglobin has risen from 8.9-12.1 on iron infusions.  Her shortness of breath has improved with this.  GI work-up revealed no significant source of bleeding.  Patient's anemia was attributed to gradual GI blood loss coupled with iron deficiency anemia due to poor iron absorption.  He is due to recheck a CBC today.  Her blood pressures well controlled at 132/68.  She denies any chest pain shortness of breath or dyspnea on exertion.  She still has a prominent systolic ejection murmur heard best over the aortic valve however she denies any syncope.  She does occasionally have palpitations but denies any tachycardia or lightheadedness.  She is due today to recheck her blood sugar.  She denies any polyuria, polydipsia, or blurry vision.  She denies any neuropathy in her feet today.  However she did recently tripped over her pet cat and injured her left foot.  There is significant  bruising on the dorsum of the left foot over the metatarsal bones all the way down to the dorsum and plantar surface of the MTP joints of the second through fifth toes.  There is tenderness to palpation all along the midfoot.  However she is able to bear weight and walk.   Past Medical History:  Diagnosis Date  . Acute encephalopathy 09/14/2016  . Adenomatous polyp   . Anxiety   . Aortic stenosis   . Arrhythmia   . Chronic upper GI bleeding    intestinal AVM's, portal gastropathy, esophageal varices on egd 04/2018  . Dementia ([place]) 09/16/2016  . Diabetes ([place])   . DIVERTICULITIS OF COLON 06/14/2008   Qualifier: Diagnosis of  By: [name]    . Diverticulosis   . DM (diabetes mellitus), type 2 ([place]) 09/15/2016  . Esophageal varices determined by endoscopy ([place])   . Fatty liver   .  Hyperlipidemia   . Hypertension   . Irritable bowel syndrome 04/06/2008   Centricity Description: IBS Qualifier: Diagnosis of  By: [name] CMA ([name]), [name]   Centricity Description: IRRITABLE BOWEL SYNDROME Qualifier: Diagnosis of  By: [name] MD, [name]   . Liver cirrhosis secondary to NASH ([place]) 09/16/2016  . NASH (nonalcoholic steatohepatitis)   . Obesity   . Portal hypertensive gastropathy ([place])   . SOB (shortness of breath)   . Splenomegaly 09/16/2016  . Thrombocytopenia ([place])   . Thyroid disease   . Vitamin B 12 deficiency 09/16/2016  . Vitamin D deficiency    Past Surgical History:  Procedure Laterality Date  . COLONOSCOPY WITH PROPOFOL N/A 04/09/2018   Procedure: COLONOSCOPY WITH PROPOFOL;  Surgeon: [name], MD;  Location: [facility];  Service: Endoscopy;  Laterality: N/A;  . ESOPHAGOGASTRODUODENOSCOPY (EGD) WITH PROPOFOL N/A 04/09/2018   Procedure: ESOPHAGOGASTRODUODENOSCOPY (EGD) WITH PROPOFOL;  Surgeon: [name], MD;  Location: [facility];  Service: Endoscopy;  Laterality: N/A;  . oopherectomy    . OVARIAN CYST REMOVAL    . POLYPECTOMY  04/09/2018   Procedure: POLYPECTOMY;   Surgeon: [name], MD;  Location: [facility];  Service: Endoscopy;;   Current Outpatient Medications on File Prior to Visit  Medication Sig Dispense Refill  . ACCU-CHEK AVIVA PLUS test strip     . ACCU-CHEK SOFTCLIX LANCETS lancets     . amLODipine (NORVASC) 10 MG tablet Take 1 tablet (10 mg total) by mouth daily. 90 tablet 3  . atorvastatin (LIPITOR) 20 MG tablet TAKE 1 TABLET (20 MG TOTAL) BY MOUTH DAILY. 90 tablet 3  . Blood Glucose Monitoring Suppl (ACCU-CHEK AVIVA PLUS) w/Device KIT     . ferrous sulfate 325 (65 FE) MG tablet Take 1 tablet (325 mg total) by mouth daily. 90 tablet 3  . furosemide (LASIX) 20 MG tablet Take 1 tablet (20 mg total) by mouth daily. 90 tablet 3  . levothyroxine (SYNTHROID, LEVOTHROID) 75 MCG tablet TAKE 1 TABLET EVERY DAY 90 tablet 3  . losartan (COZAAR) 100 MG tablet Take 1 tablet (100 mg total) by mouth daily. Stop lisinopril hctz 90 tablet 3  . metFORMIN (GLUCOPHAGE-XR) 500 MG 24 hr tablet TAKE 2 TABLETS TWICE DAILY 360 tablet 1  . pantoprazole (PROTONIX) 40 MG tablet Take 1 tablet (40 mg total) by mouth daily. 30 tablet 3  . potassium chloride SA (K-DUR,KLOR-CON) 20 MEQ tablet Take 1 tablet (20 mEq total) by mouth daily. 90 tablet 3  . propranolol (INDERAL) 40 MG tablet TAKE 1 TABLET TWICE DAILY 180 tablet 0  . traZODone (DESYREL) 50 MG tablet TAKE 1 TABLET AT BEDTIME AS NEEDED FOR SLEEP 90 tablet 1  . zolpidem (AMBIEN) 5 MG tablet TAKE 1 TABLET BY MOUTH AT BEDTIME AS NEEDED FOR SLEEP 30 tablet 2   Current Facility-Administered Medications on File Prior to Visit  Medication Dose Route Frequency Provider Last Rate Last Dose  . cyanocobalamin ((VITAMIN B-12)) injection 1,000 mcg  1,000 mcg Subcutaneous Q30 days [name], MD   1,000 mcg at 09/08/17 1408   Allergies  Allergen Reactions  . Celecoxib   . Prednisone Itching    Heart rate increased  . Macrodantin [Nitrofurantoin Macrocrystal] Rash  . Penicillins Hives    Per family Has  patient had a PCN reaction causing immediate rash, facial/tongue/throat swelling, SOB or lightheadedness with hypotension: Unknown Has patient had a PCN reaction causing severe rash involving mucus membranes or skin necrosis: Unknown Has patient had  a PCN reaction that required hospitalization: No Has patient had a PCN reaction occurring within the last 10 years: No If all of the above answers are "NO", then [place] proceed with Cephalosporin use.    Social History   Socioeconomic History  . Marital status: Married    Spouse name: Not on file  . Number of children: Not on file  . Years of education: Not on file  . Highest education level: Not on file  Occupational History  . Not on file  Social Needs  . Financial resource strain: Not on file  . Food insecurity:    Worry: Not on file    Inability: Not on file  . Transportation needs:    Medical: Not on file    Non-medical: Not on file  Tobacco Use  . Smoking status: Current Every Day Smoker    Packs/day: 0.50    Types: Cigarettes  . Smokeless tobacco: Never Used  Substance and Sexual Activity  . Alcohol use: No  . Drug use: No  . Sexual activity: Not Currently  Lifestyle  . Physical activity:    Days per week: Not on file    Minutes per session: Not on file  . Stress: Not on file  Relationships  . Social connections:    Talks on phone: Not on file    Gets together: Not on file    Attends religious service: Not on file    Active member of club or organization: Not on file    Attends meetings of clubs or organizations: Not on file    Relationship status: Not on file  . Intimate partner violence:    Fear of current or ex partner: Not on file    Emotionally abused: Not on file    Physically abused: Not on file    Forced sexual activity: Not on file  Other Topics Concern  . Not on file  Social History Narrative  . Not on file   Family History  Problem Relation Age of Onset  . Lung cancer Father   . Heart disease  Father        MI at age 36  . Alcohol abuse Father   . Alzheimer's disease Mother   . Dementia Mother   . Other Mother        twisted colon   . Stomach cancer Neg Hx   . Colon cancer Neg Hx       Review of Systems  All other systems reviewed and are negative.      Objective:   Physical Exam  Constitutional: She is oriented to person, place, and time. She appears well-developed and well-nourished.  HENT:  Right Ear: External ear normal.  Left Ear: External ear normal.  Nose: Nose normal.  Mouth/Throat: Oropharynx is clear and moist. No oropharyngeal exudate.  Eyes: Pupils are equal, round, and reactive to light. Conjunctivae and EOM are normal.  Neck: Neck supple. No JVD present. No thyromegaly present.  Cardiovascular: Normal rate and regular rhythm.  Murmur heard.  Systolic murmur is present with a grade of 3/6. Pulmonary/Chest: Effort normal. No respiratory distress. She has no wheezes. She has no rales.  Abdominal: Soft. Bowel sounds are normal. She exhibits no distension. There is no abdominal tenderness. There is no rebound and no guarding.  Musculoskeletal:        General: No edema.     Left foot: Tenderness and swelling present.       Feet:  Lymphadenopathy:  She has no cervical adenopathy.  Neurological: She is alert and oriented to person, place, and time. She has normal reflexes. No cranial nerve deficit. She exhibits normal muscle tone. Coordination normal.  Psychiatric: She has a normal mood and affect. Her behavior is normal. Judgment and thought content normal.  Vitals reviewed.         Assessment & Plan:  Benign essential HTN  Controlled type 2 diabetes mellitus with complication, without long-term current use of insulin (HCC) - Plan: Hemoglobin A1c, CBC with Differential/Platelet, COMPLETE METABOLIC PANEL WITH GFR, Lipid panel  Iron deficiency anemia due to chronic blood loss  Left foot pain - Plan: DG Foot Complete Left  Blood pressure  today is well controlled.  We will not make any changes in her antihypertensives.  I will monitor her hemoglobin A1c regarding her diabetes.  Goal hemoglobin A1c is less than 6.5.  I will monitor her CBC to monitor the management of her iron deficiency anemia.  If dropping less than 10 she will be due for another iron infusion.  Continue to monitor her kidney and liver function test as well as her cholesterol.  Her goal LDL cholesterol is less than 100.  I believe the patient sprained her foot.  I do not believe there is a fracture however I will obtain an x-ray of the foot.  Patient is scheduled already to follow-up with her cardiologist to discuss her echocardiogram regarding her moderate to severe aortic stenosis at the end of this month.

## 2018-09-22 ENCOUNTER — Inpatient Hospital Stay: Payer: Medicare HMO

## 2018-09-22 ENCOUNTER — Inpatient Hospital Stay: Payer: Medicare HMO | Admitting: Oncology

## 2018-09-22 LAB — COMPLETE METABOLIC PANEL WITH GFR
AG RATIO: 1.4 (calc) (ref 1.0–2.5)
ALT: 10 U/L (ref 6–29)
AST: 16 U/L (ref 10–35)
Albumin: 3.7 g/dL (ref 3.6–5.1)
Alkaline phosphatase (APISO): 98 U/L (ref 37–153)
BUN: 15 mg/dL (ref 7–25)
CO2: 27 mmol/L (ref 20–32)
Calcium: 9.5 mg/dL (ref 8.6–10.4)
Chloride: 109 mmol/L (ref 98–110)
Creat: 0.9 mg/dL (ref 0.50–0.99)
GFR, Est African American: 76 mL/min/{1.73_m2} (ref >=60)
GFR, Est Non African American: 65 mL/min/{1.73_m2} (ref >=60)
Globulin: 2.6 g/dL (calc) (ref 1.9–3.7)
Glucose, Bld: 150 mg/dL — ABNORMAL HIGH (ref 65–99)
Potassium: 3.7 mmol/L (ref 3.5–5.3)
SODIUM: 143 mmol/L (ref 135–146)
Total Bilirubin: 0.8 mg/dL (ref 0.2–1.2)
Total Protein: 6.3 g/dL (ref 6.1–8.1)

## 2018-09-22 LAB — CBC WITH DIFFERENTIAL/PLATELET
Absolute Monocytes: 855 cells/uL (ref 200–950)
Basophils Absolute: 63 cells/uL (ref 0–200)
Basophils Relative: 0.7 %
EOS PCT: 3.1 %
Eosinophils Absolute: 279 cells/uL (ref 15–500)
HCT: 34.5 % — ABNORMAL LOW (ref 35.0–45.0)
Hemoglobin: 11.8 g/dL (ref 11.7–15.5)
Lymphs Abs: 2520 cells/uL (ref 850–3900)
MCH: 29.9 pg (ref 27.0–33.0)
MCHC: 34.2 g/dL (ref 32.0–36.0)
MCV: 87.3 fL (ref 80.0–100.0)
MPV: 11 fL (ref 7.5–12.5)
Monocytes Relative: 9.5 %
Neutro Abs: 5283 cells/uL (ref 1500–7800)
Neutrophils Relative %: 58.7 %
Platelets: 151 10*3/uL (ref 140–400)
RBC: 3.95 10*6/uL (ref 3.80–5.10)
RDW: 13.5 % (ref 11.0–15.0)
Total Lymphocyte: 28 %
WBC: 9 10*3/uL (ref 3.8–10.8)

## 2018-09-22 LAB — LIPID PANEL
Cholesterol: 140 mg/dL (ref <200)
HDL: 45 mg/dL — ABNORMAL LOW (ref >=50)
LDL Cholesterol (Calc): 74 mg/dL (calc)
Non-HDL Cholesterol (Calc): 95 mg/dL (calc) (ref <130)
Total CHOL/HDL Ratio: 3.1 (calc) (ref <5.0)
Triglycerides: 130 mg/dL (ref <150)

## 2018-09-22 LAB — HEMOGLOBIN A1C
Hgb A1c MFr Bld: 5.5 % of total Hgb (ref <5.7)
Mean Plasma Glucose: 111 (calc)
eAG (mmol/L): 6.2 (calc)

## 2018-09-23 ENCOUNTER — Ambulatory Visit
Admission: RE | Admit: 2018-09-23 | Discharge: 2018-09-23 | Disposition: A | Discharge status: Home / Self Care | Payer: Medicare HMO | Source: Ambulatory Visit | Attending: Family Medicine | Admitting: Family Medicine

## 2018-09-23 DIAGNOSIS — S92342A Displaced fracture of fourth metatarsal bone, left foot, initial encounter for closed fracture: Secondary | ICD-10-CM | POA: Diagnosis not present

## 2018-09-23 DIAGNOSIS — S92332A Displaced fracture of third metatarsal bone, left foot, initial encounter for closed fracture: Secondary | ICD-10-CM | POA: Diagnosis not present

## 2018-09-23 DIAGNOSIS — M79672 Pain in left foot: Secondary | ICD-10-CM

## 2018-09-24 ENCOUNTER — Other Ambulatory Visit: Payer: Self-pay | Admitting: Family Medicine

## 2018-09-24 DIAGNOSIS — S92902D Unspecified fracture of left foot, subsequent encounter for fracture with routine healing: Secondary | ICD-10-CM

## 2018-09-30 ENCOUNTER — Telehealth: Payer: Self-pay | Admitting: Family Medicine

## 2018-09-30 ENCOUNTER — Ambulatory Visit: Payer: Medicare HMO | Admitting: Cardiovascular Disease

## 2018-09-30 ENCOUNTER — Encounter: Payer: Self-pay | Admitting: Cardiovascular Disease

## 2018-09-30 VITALS — BP 108/66 | HR 44 | Ht 64.5 in | Wt 207.0 lb

## 2018-09-30 DIAGNOSIS — I350 Nonrheumatic aortic (valve) stenosis: Secondary | ICD-10-CM | POA: Diagnosis not present

## 2018-09-30 NOTE — Telephone Encounter (Signed)
Pt called and states that she has a yeast infection and she has used monistat that has not helped at all. She wanted to know if we could call her in the pill for the yeast infection?

## 2018-09-30 NOTE — Progress Notes (Signed)
Chief Complaint  Patient presents with  . Follow-up    HTN   History of Present Illness: 68 yo female with history of DM, HTN, HLD, memory issues,  tobacco abuse and aortic stenosis who is here today for cardiac follow up. I saw her as a new patient in February 2019 for aortic valve stenosis. Echo December 2018 with normal LV systolic function. The aortic valve was thickened with restricted leaflet excursion, mean gradient 36 mmHg, peak gradient 59 mmHg, AVA 1.22 cm2, DVI 0.39. She had mild dyspnea with exertion with fatigue and mild dizziness at times. No exertional chest pain. No near syncope, syncope or LE edema. Her aortic stenosis was moderate by echo criteria. Echo 01/27/18 with normal LV systolic function, moderately severe AS with mean gradient of 37 mmHg, peak gradient 64 mmHg, AVA 1.17cm2, DVI 0.37. At her visit here in July 2019 she was feeling fatigued but she had also just been found to be anemic. Colonoscopy September 2019 with non bleeding AVMs and diverticular disease. Non-bleeding esophageal varices were seen on upper endoscopy. Last Hgb 12.9 on 05/05/18. Echo 08/27/18 with LVEF=60-65%. There is moderately severe aortic stenosis with thickened and calcified valve leaflets. Mean gradient 37 mmHg, peak gradient 66 mmHg, Dimensionless index 0.31. AVA 0.88 cm2.   She is here today for follow up. She has been having more dyspnea with exertion. She has fatigue. She has been having dizziness. This is all progressive over the past 3 months. No lower extremity edema, orthopnea or PND. Her memory issues seem to be stable. Her husband is also my patient and he is here with her today.   Primary Care Physician: [name], MD  Past Medical History:  Diagnosis Date  . Acute encephalopathy 09/14/2016  . Adenomatous polyp   . Anxiety   . Aortic stenosis   . Arrhythmia   . Chronic upper GI bleeding    intestinal AVM's, portal gastropathy, esophageal varices on egd 04/2018  . Dementia ([place])  09/16/2016  . DIVERTICULITIS OF COLON 06/14/2008   Qualifier: Diagnosis of  By: [name]    . DM (diabetes mellitus), type 2 ([place]) 09/15/2016  . Esophageal varices determined by endoscopy ([place])   . Fatty liver   . Hyperlipidemia   . Hypertension   . Irritable bowel syndrome 04/06/2008   Centricity Description: IBS Qualifier: Diagnosis of  By: [name] CMA ([name]), [name]   Centricity Description: IRRITABLE BOWEL SYNDROME Qualifier: Diagnosis of  By: [name] MD, [name]   . Liver cirrhosis secondary to NASH ([place]) 09/16/2016  . NASH (nonalcoholic steatohepatitis)   . Obesity   . Portal hypertensive gastropathy ([place])   . SOB (shortness of breath)   . Splenomegaly 09/16/2016  . Thrombocytopenia ([place])   . Thyroid disease   . Vitamin B 12 deficiency 09/16/2016  . Vitamin D deficiency     Past Surgical History:  Procedure Laterality Date  . COLONOSCOPY WITH PROPOFOL N/A 04/09/2018   Procedure: COLONOSCOPY WITH PROPOFOL;  Surgeon: [name], MD;  Location: [facility];  Service: Endoscopy;  Laterality: N/A;  . ESOPHAGOGASTRODUODENOSCOPY (EGD) WITH PROPOFOL N/A 04/09/2018   Procedure: ESOPHAGOGASTRODUODENOSCOPY (EGD) WITH PROPOFOL;  Surgeon: [name], MD;  Location: [facility];  Service: Endoscopy;  Laterality: N/A;  . oopherectomy    . OVARIAN CYST REMOVAL    . POLYPECTOMY  04/09/2018   Procedure: POLYPECTOMY;  Surgeon: [name], MD;  Location: [facility];  Service: Endoscopy;;    Current Outpatient Medications  Medication Sig Dispense Refill  . ACCU-CHEK AVIVA PLUS test strip     . ACCU-CHEK SOFTCLIX LANCETS lancets     . amLODipine (NORVASC) 10 MG tablet Take 1 tablet (10 mg total) by mouth daily. 90 tablet 3  . atorvastatin (LIPITOR) 20 MG tablet TAKE 1 TABLET (20 MG TOTAL) BY MOUTH DAILY. 90 tablet 3  . Blood Glucose Monitoring Suppl (ACCU-CHEK AVIVA PLUS) w/Device KIT     . ferrous sulfate 325 (65 FE) MG tablet Take 1 tablet (325 mg total) by mouth daily. 90  tablet 3  . furosemide (LASIX) 20 MG tablet Take 1 tablet (20 mg total) by mouth daily. 90 tablet 3  . levothyroxine (SYNTHROID, LEVOTHROID) 75 MCG tablet TAKE 1 TABLET EVERY DAY 90 tablet 3  . losartan (COZAAR) 100 MG tablet Take 1 tablet (100 mg total) by mouth daily. Stop lisinopril hctz 90 tablet 3  . potassium chloride SA (K-DUR,KLOR-CON) 20 MEQ tablet Take 1 tablet (20 mEq total) by mouth daily. 90 tablet 3  . propranolol (INDERAL) 40 MG tablet TAKE 1 TABLET TWICE DAILY 180 tablet 0  . traZODone (DESYREL) 50 MG tablet TAKE 1 TABLET AT BEDTIME AS NEEDED FOR SLEEP 90 tablet 1  . zolpidem (AMBIEN) 5 MG tablet TAKE 1 TABLET BY MOUTH AT BEDTIME AS NEEDED FOR SLEEP 30 tablet 2  . metFORMIN (GLUCOPHAGE-XR) 500 MG 24 hr tablet TAKE 2 TABLETS TWICE DAILY (Patient not taking: Reported on 09/30/2018) 360 tablet 1   Current Facility-Administered Medications  Medication Dose Route Frequency Provider Last Rate Last Dose  . cyanocobalamin ((VITAMIN B-12)) injection 1,000 mcg  1,000 mcg Subcutaneous Q30 days [name], MD   1,000 mcg at 09/08/17 1408    Allergies  Allergen Reactions  . Celecoxib   . Prednisone Itching    Heart rate increased  . Macrodantin [Nitrofurantoin Macrocrystal] Rash  . Penicillins Hives    Per family Has patient had a PCN reaction causing immediate rash, facial/tongue/throat swelling, SOB or lightheadedness with hypotension: Unknown Has patient had a PCN reaction causing severe rash involving mucus membranes or skin necrosis: Unknown Has patient had a PCN reaction that required hospitalization: No Has patient had a PCN reaction occurring within the last 10 years: No If all of the above answers are "NO", then [place] proceed with Cephalosporin use.     Social History   Socioeconomic History  . Marital status: Married    Spouse name: Not on file  . Number of children: Not on file  . Years of education: Not on file  . Highest education level: Not on file    Occupational History  . Not on file  Social Needs  . Financial resource strain: Not on file  . Food insecurity:    Worry: Not on file    Inability: Not on file  . Transportation needs:    Medical: Not on file    Non-medical: Not on file  Tobacco Use  . Smoking status: Current Every Day Smoker    Packs/day: 0.50    Types: Cigarettes  . Smokeless tobacco: Never Used  Substance and Sexual Activity  . Alcohol use: No  . Drug use: No  . Sexual activity: Not Currently  Lifestyle  . Physical activity:    Days per week: Not on file    Minutes per session: Not on file  . Stress: Not on file  Relationships  . Social connections:    Talks on phone: Not on file    Gets  together: Not on file    Attends religious service: Not on file    Active member of club or organization: Not on file    Attends meetings of clubs or organizations: Not on file    Relationship status: Not on file  . Intimate partner violence:    Fear of current or ex partner: Not on file    Emotionally abused: Not on file    Physically abused: Not on file    Forced sexual activity: Not on file  Other Topics Concern  . Not on file  Social History Narrative  . Not on file    Family History  Problem Relation Age of Onset  . Lung cancer Father   . Heart disease Father        MI at age 21  . Alcohol abuse Father   . Alzheimer's disease Mother   . Dementia Mother   . Other Mother        twisted colon   . Stomach cancer Neg Hx   . Colon cancer Neg Hx     Review of Systems:  As stated in the HPI and otherwise negative.   BP 108/66   Pulse (!) 44   Ht 5' 4.5" (1.638 m)   Wt 93.9 kg   SpO2 94%   BMI 34.98 kg/m   Physical Examination:  General: Well developed, well nourished, NAD  HEENT: OP clear, mucus membranes moist  SKIN: warm, dry. No rashes. Neuro: No focal deficits  Musculoskeletal: Muscle strength 5/5 all ext  Psychiatric: Mood and affect normal  Neck: No JVD, no carotid bruits, no  thyromegaly, no lymphadenopathy.  Lungs:Clear bilaterally, no wheezes, rhonci, crackles Cardiovascular: Regular rate and rhythm. Harsh, late peaking systolic murmur.  Abdomen:Soft. Bowel sounds present. Non-tender.  Extremities: No lower extremity edema. Pulses are 2 + in the bilateral DP/PT.  Echo 01/27/18: - Left ventricle: The cavity size was normal. There was moderate   concentric hypertrophy. Systolic function was normal. The   estimated ejection fraction was in the range of 60% to 65%. Wall   motion was normal; there were no regional wall motion   abnormalities. Doppler parameters are consistent with abnormal   left ventricular relaxation (grade 1 diastolic dysfunction).   Doppler parameters are consistent with elevated mean left atrial   filling pressure. - Aortic valve: Valve mobility was restricted. There was moderate   to severe stenosis. Valve area (VTI): 0.88 cm^2. Valve area   (Vmax): 0.94 cm^2. Valve area (Vmean): 0.93 cm^2. - Left atrium: The atrium was mildly dilated. - Tricuspid valve: There was mild-moderate regurgitation.  Impressions:  - No change since June 2019.  ------------------------------------------------------------------- Study data:  Comparison was made to the study of 01/27/2018.  Study status:  Routine.  Procedure:  The patient reported no pain pre or post test. Transthoracic echocardiography. Image quality was adequate.          Transthoracic echocardiography.  M-mode, complete 2D, spectral Doppler, and color Doppler.  Birthdate: Patient birthdate: 01-Jun-1951.  Age:  Patient is 68 yr old.  Sex: Gender: female.    BMI: 35.8 kg/m^2.  Blood pressure:     156/75 Patient status:  Outpatient.  Study date:  Study date: 08/27/2018. Study time: 11:42 AM.  Location:  [facility]  -------------------------------------------------------------------  ------------------------------------------------------------------- Left ventricle:  The cavity  size was normal. There was moderate concentric hypertrophy. Systolic function was normal. The estimated ejection fraction was in the range of 60% to 65%. Wall motion  was normal; there were no regional wall motion abnormalities. Doppler parameters are consistent with abnormal left ventricular relaxation (grade 1 diastolic dysfunction). Doppler parameters are consistent with elevated mean left atrial filling pressure.  ------------------------------------------------------------------- Aortic valve:   Probably trileaflet; moderately thickened, moderately calcified leaflets. Valve mobility was restricted. Doppler:   There was moderate to severe stenosis.   There was no significant regurgitation.    VTI ratio of LVOT to aortic valve: 0.31. Valve area (VTI): 0.88 cm^2. Indexed valve area (VTI): 0.41 cm^2/m^2. Peak velocity ratio of LVOT to aortic valve: 0.33. Valve area (Vmax): 0.94 cm^2. Indexed valve area (Vmax): 0.44 cm^2/m^2. Mean velocity ratio of LVOT to aortic valve: 0.33. Valve area (Vmean): 0.93 cm^2. Indexed valve area (Vmean): 0.44 cm^2/m^2. Mean gradient (S): 37 mm Hg. Peak gradient (S): 66 mm Hg.  ------------------------------------------------------------------- Aorta:  Aortic root: The aortic root was normal in size. Ascending aorta: The ascending aorta was normal in size.  ------------------------------------------------------------------- Mitral valve:   Mildly thickened leaflets .  Doppler: Transvalvular velocity was within the normal range. There was no evidence for stenosis. There was no significant regurgitation. Peak gradient (D): 3 mm Hg.  ------------------------------------------------------------------- Left atrium:  The atrium was mildly dilated.  ------------------------------------------------------------------- Right ventricle:  The cavity size was normal. Systolic function  was normal.  ------------------------------------------------------------------- Pulmonic valve:   Poorly visualized.  The valve appears to be grossly normal.    Doppler:  There was mild regurgitation.  ------------------------------------------------------------------- Tricuspid valve:   Structurally normal valve.   Leaflet separation was normal.  Doppler:  Transvalvular velocity was within the normal range. There was mild-moderate regurgitation.  ------------------------------------------------------------------- Pulmonary artery:   Systolic pressure was within the normal range.   ------------------------------------------------------------------- Right atrium:  The atrium was normal in size.  ------------------------------------------------------------------- Pericardium:  There was no pericardial effusion.  ------------------------------------------------------------------- Measurements   Left ventricle                          Value           Reference  LV ID, ED, PLAX chordal                 44.6   mm       43 - 52  LV ID, ES, PLAX chordal                 27.8   mm       23 - 38  LV fx shortening, PLAX chordal          38     %        >=29  LV PW thickness, ED                     15.7   mm       ----------  IVS/LV PW ratio, ED                     0.84            <=1.3  Stroke volume, 2D                       97     ml       ----------  Stroke volume/bsa, 2D                   45     ml/m^2   ----------  LV  e&', lateral                          5.22   cm/s     ----------  LV E/e&', lateral                        16.53           ----------  LV e&', medial                           3.37   cm/s     ----------  LV E/e&', medial                         25.61           ----------  LV e&', average                          4.3    cm/s     ----------  LV E/e&', average                        20.09           ----------  Longitudinal strain, TDI                19     %         ----------    Ventricular septum                      Value           Reference  IVS thickness, ED                       13.26  mm       ----------    LVOT                                    Value           Reference  LVOT ID, S                              19     mm       ----------  LVOT area                               2.84   cm^2     ----------  LVOT peak velocity, S                   122.26 cm/s     ----------  LVOT mean velocity, S                   94.7   cm/s     ----------  LVOT VTI, S                             29.84  cm       ----------  LVOT peak gradient, S  6      mm Hg    ----------  Stroke volume (SV), LVOT DP             84.6   ml       ----------  Stroke index (SV/bsa), LVOT DP          39.6   ml/m^2   ----------    Aortic valve                            Value           Reference  Aortic valve peak velocity, S           405    cm/s     ----------  Aortic valve mean velocity, S           288    cm/s     ----------  Aortic valve VTI, S                     101    cm       ----------  Aortic mean gradient, S                 37     mm Hg    ----------  Aortic peak gradient, S                 66     mm Hg    ----------  VTI ratio, LVOT/AV                      0.31            ----------  Aortic valve area, VTI                  0.88   cm^2     ----------  Aortic valve area/bsa, VTI              0.41   cm^2/m^2 ----------  Velocity ratio, peak, LVOT/AV           0.33            ----------  Aortic valve area, peak velocity        0.94   cm^2     ----------  Aortic valve area/bsa, peak             0.44   cm^2/m^2 ----------  velocity  Velocity ratio, mean, LVOT/AV           0.33            ----------  Aortic valve area, mean velocity        0.93   cm^2     ----------  Aortic valve area/bsa, mean             0.44   cm^2/m^2 ----------  velocity    Aorta                                   Value           Reference  Aortic root ID, ED                      29      mm       ----------    Left atrium  Value           Reference  LA ID, A-P, ES                          39     mm       ----------  LA ID/bsa, A-P                          1.83   cm/m^2   <=2.2  LA volume, S                            65.1   ml       ----------  LA volume/bsa, S                        30.5   ml/m^2   ----------  LA volume, ES, 1-p A4C                  58.8   ml       ----------  LA volume/bsa, ES, 1-p A4C              27.6   ml/m^2   ----------  LA volume, ES, 1-p A2C                  70     ml       ----------  LA volume/bsa, ES, 1-p A2C              32.8   ml/m^2   ----------    Mitral valve                            Value           Reference  Mitral E-wave peak velocity             86.3   cm/s     ----------  Mitral A-wave peak velocity             96     cm/s     ----------  Mitral deceleration time         (H)    511    ms       150 - 230  Mitral peak gradient, D                 3      mm Hg    ----------  Mitral E/A ratio, peak                  0.9             ----------    Pulmonary arteries                      Value           Reference  PA pressure, S, DP                      28     mm Hg    <=30    Tricuspid valve                         Value  Reference  Tricuspid regurg peak velocity          251    cm/s     ----------  Tricuspid peak RV-RA gradient           25     mm Hg    ----------  Tricuspid maximal regurg                251    cm/s     ----------  velocity, PISA    Right atrium                            Value           Reference  RA ID, S-I, ES, A4C                     48.2   mm       34 - 49  RA area, ES, A4C                        13.1   cm^2     8.3 - 19.5  RA volume, ES, A/L                      29.1   ml       ----------  RA volume/bsa, ES, A/L                  13.6   ml/m^2   ----------    Systemic veins                          Value           Reference  Estimated CVP                           3       mm Hg    ----------    Right ventricle                         Value           Reference  TAPSE                                   28.7   mm       ----------  RV pressure, S, DP                      28     mm Hg    <=30  RV s&', lateral, S                       11.6   cm/s     ----------  EKG:  EKG is ordered today. The ekg ordered today demonstrates sinus brady, rate 44 bpm. Non-specific T wave abn  Recent Labs: 11/28/2017: TSH 1.08 12/23/2017: Brain Natriuretic Peptide 35 09/21/2018: ALT 10; BUN 15; Creat 0.90; Hemoglobin 11.8; Platelets 151; Potassium 3.7; Sodium 143   Lipid Panel    Component Value Date/Time   CHOL 140 09/21/2018 1203   TRIG 130 09/21/2018 1203   HDL 45 (L) 09/21/2018 1203   CHOLHDL 3.1 09/21/2018 1203  VLDL 20 03/26/2017 0908   LDLCALC 74 09/21/2018 1203     Wt Readings from Last 3 Encounters:  09/30/18 93.9 kg  09/21/18 95.7 kg  07/14/18 96.2 kg     Other studies Reviewed: Additional studies/ records that were reviewed today include: . Review of the above records demonstrates:    Assessment and Plan:   1. Severe Aortic stenosis: Her aortic valve is thickened and calcified with limited leaflet mobility. Mean gradient 37 mmHg. Peak gradient 66 mmHg, AVA 0.88 cm2.  I have personally reviewed the echo images. She is now having progressive dyspnea, fatigue and dizziness. She has severe, stage D aortic valve stenosis. I think she would benefit from AVR. I think she [place] be a good candidate for TAVR.   I have reviewed the natural history of aortic stenosis with the patient and their family members  who are present today. We have discussed the limitations of medical therapy and the poor prognosis associated with symptomatic aortic stenosis. We have reviewed potential treatment options, including palliative medical therapy, conventional surgical aortic valve replacement, and transcatheter aortic valve replacement. We discussed treatment options in the context of the  patient's specific comorbid medical conditions.   She would like to proceed with planning for TAVR. I will arrange a right and left heart catheterization at [facility] 10/07/18 at 10am. Risks and benefits of procedure reviewed with the patient. After the cath, she will have a gated cardiac CT, CTA of the chest/abdomen and pelvis, PFTs, carotid dopplers and PT assessment and will then be referred to see one of the CT surgeons on our TAVR team.   2. Tobacco abuse: Smoking cessation is advised.   Current medicines are reviewed at length with the patient today.  The patient does not have concerns regarding medicines.  The following changes have been made:  no change  Labs/ tests ordered today include:   Orders Placed This Encounter  Procedures  . EKG 12-Lead     Disposition:   FU with the TAVR team.   Signed, [name], MD 09/30/2018 4:05 PM    [address] [place], [place], [place]  [address] Phone: [phone]; Fax: [phone]

## 2018-09-30 NOTE — Patient Instructions (Signed)
Medication Instructions:  Your physician recommends that you continue on your current medications as directed. Please refer to the Current Medication list given to you today.  If you need a refill on your cardiac medications before your next appointment, please call your pharmacy.   Lab work: none If you have labs (blood work) drawn today and your tests are completely normal, you will receive your results only by: Marland Kitchen MyChart Message (if you have MyChart) OR . A paper copy in the mail If you have any lab test that is abnormal or we need to change your treatment, we will call you to review the results.  Testing/Procedures: Your physician has requested that you have a cardiac catheterization. Cardiac catheterization is used to diagnose and/or treat various heart conditions. Doctors Kelly [name] recommend this procedure for a number of different reasons. The most common reason is to evaluate chest pain. Chest pain can be a symptom of coronary artery disease (CAD), and cardiac catheterization can show whether plaque is narrowing or blocking your heart's arteries. This procedure is also used to evaluate the valves, as well as measure the blood flow and oxygen levels in different parts of your heart. For further information please visit [email or web address]. Please follow instruction sheet, as given.  Scheduled for March 4,2020  Follow-Up: To be determined after catheterization     [facility] OFFICE [place], [place] [address] Dept: [phone] Loc: [phone]  Kelly [name]  09/30/2018  You are scheduled for a Cardiac Catheterization on Wednesday, March 4 with Dr. Lauree [name].  1. Please arrive at the [facility] (Main Entrance A) at [facility]: [address] [place], [place] [address] at 8:00 AM (This time is two hours before your procedure to ensure your preparation). Free valet  parking service is available.   Special note: Every effort is made to have your procedure done on time. Please understand that emergencies sometimes delay scheduled procedures.  2. Diet: Do not eat solid foods after midnight.  The patient Kelly [name] have clear liquids until 5am upon the day of the procedure.  3. Labs: Has already been done  4. Medication instructions in preparation for your procedure:   Contrast Allergy: No  Do not take metformin day of procedure and for 48 hours after procedure.  Do not take furosemide and potassium the morning of the procedure.    On the morning of your procedure, take your Aspirin 81 mg and any morning medicines NOT listed above.  You Kelly [name] use sips of water.  5. Plan for one night stay--bring personal belongings. 6. Bring a current list of your medications and current insurance cards. 7. You MUST have a responsible person to drive you home. 8. Someone MUST be with you the first 24 hours after you arrive home or your discharge will be delayed. 9. Please wear clothes that are easy to get on and off and wear slip-on shoes.  Thank you for allowing [place] to care for you!   -- [facility]

## 2018-09-30 NOTE — H&P (View-Only) (Signed)
Chief Complaint  Patient presents with  . Follow-up    HTN   History of Present Illness: 68 yo female with history of DM, HTN, HLD, memory issues,  tobacco abuse and aortic stenosis who is here today for cardiac follow up. I saw her as a new patient in February 2019 for aortic valve stenosis. Echo December 2018 with normal LV systolic function. The aortic valve was thickened with restricted leaflet excursion, mean gradient 36 mmHg, peak gradient 59 mmHg, AVA 1.22 cm2, DVI 0.39. She had mild dyspnea with exertion with fatigue and mild dizziness at times. No exertional chest pain. No near syncope, syncope or LE edema. Her aortic stenosis was moderate by echo criteria. Echo 01/27/18 with normal LV systolic function, moderately severe AS with mean gradient of 37 mmHg, peak gradient 64 mmHg, AVA 1.17cm2, DVI 0.37. At her visit here in July 2019 she was feeling fatigued but she had also just been found to be anemic. Colonoscopy September 2019 with non bleeding AVMs and diverticular disease. Non-bleeding esophageal varices were seen on upper endoscopy. Last Hgb 12.9 on 05/05/18. Echo 08/27/18 with LVEF=60-65%. There is moderately severe aortic stenosis with thickened and calcified valve leaflets. Mean gradient 37 mmHg, peak gradient 66 mmHg, Dimensionless index 0.31. AVA 0.88 cm2.   She is here today for follow up. She has been having more dyspnea with exertion. She has fatigue. She has been having dizziness. This is all progressive over the past 3 months. No lower extremity edema, orthopnea or PND. Her memory issues seem to be stable. Her husband is also my patient and he is here with her today.   Primary Care Physician: [name], MD  Past Medical History:  Diagnosis Date  . Acute encephalopathy 09/14/2016  . Adenomatous polyp   . Anxiety   . Aortic stenosis   . Arrhythmia   . Chronic upper GI bleeding    intestinal AVM's, portal gastropathy, esophageal varices on egd 04/2018  . Dementia ([place])  09/16/2016  . DIVERTICULITIS OF COLON 06/14/2008   Qualifier: Diagnosis of  By: [name]    . DM (diabetes mellitus), type 2 ([place]) 09/15/2016  . Esophageal varices determined by endoscopy ([place])   . Fatty liver   . Hyperlipidemia   . Hypertension   . Irritable bowel syndrome 04/06/2008   Centricity Description: IBS Qualifier: Diagnosis of  By: [name] CMA ([name]), [name]   Centricity Description: IRRITABLE BOWEL SYNDROME Qualifier: Diagnosis of  By: [name] MD, [name]   . Liver cirrhosis secondary to NASH ([place]) 09/16/2016  . NASH (nonalcoholic steatohepatitis)   . Obesity   . Portal hypertensive gastropathy ([place])   . SOB (shortness of breath)   . Splenomegaly 09/16/2016  . Thrombocytopenia ([place])   . Thyroid disease   . Vitamin B 12 deficiency 09/16/2016  . Vitamin D deficiency     Past Surgical History:  Procedure Laterality Date  . COLONOSCOPY WITH PROPOFOL N/A 04/09/2018   Procedure: COLONOSCOPY WITH PROPOFOL;  Surgeon: [name], MD;  Location: [facility];  Service: Endoscopy;  Laterality: N/A;  . ESOPHAGOGASTRODUODENOSCOPY (EGD) WITH PROPOFOL N/A 04/09/2018   Procedure: ESOPHAGOGASTRODUODENOSCOPY (EGD) WITH PROPOFOL;  Surgeon: [name], MD;  Location: [facility];  Service: Endoscopy;  Laterality: N/A;  . oopherectomy    . OVARIAN CYST REMOVAL    . POLYPECTOMY  04/09/2018   Procedure: POLYPECTOMY;  Surgeon: [name], MD;  Location: [facility];  Service: Endoscopy;;    Current Outpatient Medications  Medication Sig Dispense Refill  . ACCU-CHEK AVIVA PLUS test strip     . ACCU-CHEK SOFTCLIX LANCETS lancets     . amLODipine (NORVASC) 10 MG tablet Take 1 tablet (10 mg total) by mouth daily. 90 tablet 3  . atorvastatin (LIPITOR) 20 MG tablet TAKE 1 TABLET (20 MG TOTAL) BY MOUTH DAILY. 90 tablet 3  . Blood Glucose Monitoring Suppl (ACCU-CHEK AVIVA PLUS) w/Device KIT     . ferrous sulfate 325 (65 FE) MG tablet Take 1 tablet (325 mg total) by mouth daily. 90  tablet 3  . furosemide (LASIX) 20 MG tablet Take 1 tablet (20 mg total) by mouth daily. 90 tablet 3  . levothyroxine (SYNTHROID, LEVOTHROID) 75 MCG tablet TAKE 1 TABLET EVERY DAY 90 tablet 3  . losartan (COZAAR) 100 MG tablet Take 1 tablet (100 mg total) by mouth daily. Stop lisinopril hctz 90 tablet 3  . potassium chloride SA (K-DUR,KLOR-CON) 20 MEQ tablet Take 1 tablet (20 mEq total) by mouth daily. 90 tablet 3  . propranolol (INDERAL) 40 MG tablet TAKE 1 TABLET TWICE DAILY 180 tablet 0  . traZODone (DESYREL) 50 MG tablet TAKE 1 TABLET AT BEDTIME AS NEEDED FOR SLEEP 90 tablet 1  . zolpidem (AMBIEN) 5 MG tablet TAKE 1 TABLET BY MOUTH AT BEDTIME AS NEEDED FOR SLEEP 30 tablet 2  . metFORMIN (GLUCOPHAGE-XR) 500 MG 24 hr tablet TAKE 2 TABLETS TWICE DAILY (Patient not taking: Reported on 09/30/2018) 360 tablet 1   Current Facility-Administered Medications  Medication Dose Route Frequency Provider Last Rate Last Dose  . cyanocobalamin ((VITAMIN B-12)) injection 1,000 mcg  1,000 mcg Subcutaneous Q30 days [name], MD   1,000 mcg at 09/08/17 1408    Allergies  Allergen Reactions  . Celecoxib   . Prednisone Itching    Heart rate increased  . Macrodantin [Nitrofurantoin Macrocrystal] Rash  . Penicillins Hives    Per family Has patient had a PCN reaction causing immediate rash, facial/tongue/throat swelling, SOB or lightheadedness with hypotension: Unknown Has patient had a PCN reaction causing severe rash involving mucus membranes or skin necrosis: Unknown Has patient had a PCN reaction that required hospitalization: No Has patient had a PCN reaction occurring within the last 10 years: No If all of the above answers are "NO", then [place] proceed with Cephalosporin use.     Social History   Socioeconomic History  . Marital status: Married    Spouse name: Not on file  . Number of children: Not on file  . Years of education: Not on file  . Highest education level: Not on file    Occupational History  . Not on file  Social Needs  . Financial resource strain: Not on file  . Food insecurity:    Worry: Not on file    Inability: Not on file  . Transportation needs:    Medical: Not on file    Non-medical: Not on file  Tobacco Use  . Smoking status: Current Every Day Smoker    Packs/day: 0.50    Types: Cigarettes  . Smokeless tobacco: Never Used  Substance and Sexual Activity  . Alcohol use: No  . Drug use: No  . Sexual activity: Not Currently  Lifestyle  . Physical activity:    Days per week: Not on file    Minutes per session: Not on file  . Stress: Not on file  Relationships  . Social connections:    Talks on phone: Not on file    Gets  together: Not on file    Attends religious service: Not on file    Active member of club or organization: Not on file    Attends meetings of clubs or organizations: Not on file    Relationship status: Not on file  . Intimate partner violence:    Fear of current or ex partner: Not on file    Emotionally abused: Not on file    Physically abused: Not on file    Forced sexual activity: Not on file  Other Topics Concern  . Not on file  Social History Narrative  . Not on file    Family History  Problem Relation Age of Onset  . Lung cancer Father   . Heart disease Father        MI at age 21  . Alcohol abuse Father   . Alzheimer's disease Mother   . Dementia Mother   . Other Mother        twisted colon   . Stomach cancer Neg Hx   . Colon cancer Neg Hx     Review of Systems:  As stated in the HPI and otherwise negative.   BP 108/66   Pulse (!) 44   Ht 5' 4.5" (1.638 m)   Wt 93.9 kg   SpO2 94%   BMI 34.98 kg/m   Physical Examination:  General: Well developed, well nourished, NAD  HEENT: OP clear, mucus membranes moist  SKIN: warm, dry. No rashes. Neuro: No focal deficits  Musculoskeletal: Muscle strength 5/5 all ext  Psychiatric: Mood and affect normal  Neck: No JVD, no carotid bruits, no  thyromegaly, no lymphadenopathy.  Lungs:Clear bilaterally, no wheezes, rhonci, crackles Cardiovascular: Regular rate and rhythm. Harsh, late peaking systolic murmur.  Abdomen:Soft. Bowel sounds present. Non-tender.  Extremities: No lower extremity edema. Pulses are 2 + in the bilateral DP/PT.  Echo 01/27/18: - Left ventricle: The cavity size was normal. There was moderate   concentric hypertrophy. Systolic function was normal. The   estimated ejection fraction was in the range of 60% to 65%. Wall   motion was normal; there were no regional wall motion   abnormalities. Doppler parameters are consistent with abnormal   left ventricular relaxation (grade 1 diastolic dysfunction).   Doppler parameters are consistent with elevated mean left atrial   filling pressure. - Aortic valve: Valve mobility was restricted. There was moderate   to severe stenosis. Valve area (VTI): 0.88 cm^2. Valve area   (Vmax): 0.94 cm^2. Valve area (Vmean): 0.93 cm^2. - Left atrium: The atrium was mildly dilated. - Tricuspid valve: There was mild-moderate regurgitation.  Impressions:  - No change since June 2019.  ------------------------------------------------------------------- Study data:  Comparison was made to the study of 01/27/2018.  Study status:  Routine.  Procedure:  The patient reported no pain pre or post test. Transthoracic echocardiography. Image quality was adequate.          Transthoracic echocardiography.  M-mode, complete 2D, spectral Doppler, and color Doppler.  Birthdate: Patient birthdate: 01-Jun-1951.  Age:  Patient is 68 yr old.  Sex: Gender: female.    BMI: 35.8 kg/m^2.  Blood pressure:     156/75 Patient status:  Outpatient.  Study date:  Study date: 08/27/2018. Study time: 11:42 AM.  Location:  [facility]  -------------------------------------------------------------------  ------------------------------------------------------------------- Left ventricle:  The cavity  size was normal. There was moderate concentric hypertrophy. Systolic function was normal. The estimated ejection fraction was in the range of 60% to 65%. Wall motion  was normal; there were no regional wall motion abnormalities. Doppler parameters are consistent with abnormal left ventricular relaxation (grade 1 diastolic dysfunction). Doppler parameters are consistent with elevated mean left atrial filling pressure.  ------------------------------------------------------------------- Aortic valve:   Probably trileaflet; moderately thickened, moderately calcified leaflets. Valve mobility was restricted. Doppler:   There was moderate to severe stenosis.   There was no significant regurgitation.    VTI ratio of LVOT to aortic valve: 0.31. Valve area (VTI): 0.88 cm^2. Indexed valve area (VTI): 0.41 cm^2/m^2. Peak velocity ratio of LVOT to aortic valve: 0.33. Valve area (Vmax): 0.94 cm^2. Indexed valve area (Vmax): 0.44 cm^2/m^2. Mean velocity ratio of LVOT to aortic valve: 0.33. Valve area (Vmean): 0.93 cm^2. Indexed valve area (Vmean): 0.44 cm^2/m^2. Mean gradient (S): 37 mm Hg. Peak gradient (S): 66 mm Hg.  ------------------------------------------------------------------- Aorta:  Aortic root: The aortic root was normal in size. Ascending aorta: The ascending aorta was normal in size.  ------------------------------------------------------------------- Mitral valve:   Mildly thickened leaflets .  Doppler: Transvalvular velocity was within the normal range. There was no evidence for stenosis. There was no significant regurgitation. Peak gradient (D): 3 mm Hg.  ------------------------------------------------------------------- Left atrium:  The atrium was mildly dilated.  ------------------------------------------------------------------- Right ventricle:  The cavity size was normal. Systolic function  was normal.  ------------------------------------------------------------------- Pulmonic valve:   Poorly visualized.  The valve appears to be grossly normal.    Doppler:  There was mild regurgitation.  ------------------------------------------------------------------- Tricuspid valve:   Structurally normal valve.   Leaflet separation was normal.  Doppler:  Transvalvular velocity was within the normal range. There was mild-moderate regurgitation.  ------------------------------------------------------------------- Pulmonary artery:   Systolic pressure was within the normal range.   ------------------------------------------------------------------- Right atrium:  The atrium was normal in size.  ------------------------------------------------------------------- Pericardium:  There was no pericardial effusion.  ------------------------------------------------------------------- Measurements   Left ventricle                          Value           Reference  LV ID, ED, PLAX chordal                 44.6   mm       43 - 52  LV ID, ES, PLAX chordal                 27.8   mm       23 - 38  LV fx shortening, PLAX chordal          38     %        >=29  LV PW thickness, ED                     15.7   mm       ----------  IVS/LV PW ratio, ED                     0.84            <=1.3  Stroke volume, 2D                       97     ml       ----------  Stroke volume/bsa, 2D                   45     ml/m^2   ----------  LV  e&', lateral                          5.22   cm/s     ----------  LV E/e&', lateral                        16.53           ----------  LV e&', medial                           3.37   cm/s     ----------  LV E/e&', medial                         25.61           ----------  LV e&', average                          4.3    cm/s     ----------  LV E/e&', average                        20.09           ----------  Longitudinal strain, TDI                19     %         ----------    Ventricular septum                      Value           Reference  IVS thickness, ED                       13.26  mm       ----------    LVOT                                    Value           Reference  LVOT ID, S                              19     mm       ----------  LVOT area                               2.84   cm^2     ----------  LVOT peak velocity, S                   122.26 cm/s     ----------  LVOT mean velocity, S                   94.7   cm/s     ----------  LVOT VTI, S                             29.84  cm       ----------  LVOT peak gradient, S  6      mm Hg    ----------  Stroke volume (SV), LVOT DP             84.6   ml       ----------  Stroke index (SV/bsa), LVOT DP          39.6   ml/m^2   ----------    Aortic valve                            Value           Reference  Aortic valve peak velocity, S           405    cm/s     ----------  Aortic valve mean velocity, S           288    cm/s     ----------  Aortic valve VTI, S                     101    cm       ----------  Aortic mean gradient, S                 37     mm Hg    ----------  Aortic peak gradient, S                 66     mm Hg    ----------  VTI ratio, LVOT/AV                      0.31            ----------  Aortic valve area, VTI                  0.88   cm^2     ----------  Aortic valve area/bsa, VTI              0.41   cm^2/m^2 ----------  Velocity ratio, peak, LVOT/AV           0.33            ----------  Aortic valve area, peak velocity        0.94   cm^2     ----------  Aortic valve area/bsa, peak             0.44   cm^2/m^2 ----------  velocity  Velocity ratio, mean, LVOT/AV           0.33            ----------  Aortic valve area, mean velocity        0.93   cm^2     ----------  Aortic valve area/bsa, mean             0.44   cm^2/m^2 ----------  velocity    Aorta                                   Value           Reference  Aortic root ID, ED                      29      mm       ----------    Left atrium  Value           Reference  LA ID, A-P, ES                          39     mm       ----------  LA ID/bsa, A-P                          1.83   cm/m^2   <=2.2  LA volume, S                            65.1   ml       ----------  LA volume/bsa, S                        30.5   ml/m^2   ----------  LA volume, ES, 1-p A4C                  58.8   ml       ----------  LA volume/bsa, ES, 1-p A4C              27.6   ml/m^2   ----------  LA volume, ES, 1-p A2C                  70     ml       ----------  LA volume/bsa, ES, 1-p A2C              32.8   ml/m^2   ----------    Mitral valve                            Value           Reference  Mitral E-wave peak velocity             86.3   cm/s     ----------  Mitral A-wave peak velocity             96     cm/s     ----------  Mitral deceleration time         (H)    511    ms       150 - 230  Mitral peak gradient, D                 3      mm Hg    ----------  Mitral E/A ratio, peak                  0.9             ----------    Pulmonary arteries                      Value           Reference  PA pressure, S, DP                      28     mm Hg    <=30    Tricuspid valve                         Value  Reference  Tricuspid regurg peak velocity          251    cm/s     ----------  Tricuspid peak RV-RA gradient           25     mm Hg    ----------  Tricuspid maximal regurg                251    cm/s     ----------  velocity, PISA    Right atrium                            Value           Reference  RA ID, S-I, ES, A4C                     48.2   mm       34 - 49  RA area, ES, A4C                        13.1   cm^2     8.3 - 19.5  RA volume, ES, A/L                      29.1   ml       ----------  RA volume/bsa, ES, A/L                  13.6   ml/m^2   ----------    Systemic veins                          Value           Reference  Estimated CVP                           3       mm Hg    ----------    Right ventricle                         Value           Reference  TAPSE                                   28.7   mm       ----------  RV pressure, S, DP                      28     mm Hg    <=30  RV s&', lateral, S                       11.6   cm/s     ----------  EKG:  EKG is ordered today. The ekg ordered today demonstrates sinus brady, rate 44 bpm. Non-specific T wave abn  Recent Labs: 11/28/2017: TSH 1.08 12/23/2017: Brain Natriuretic Peptide 35 09/21/2018: ALT 10; BUN 15; Creat 0.90; Hemoglobin 11.8; Platelets 151; Potassium 3.7; Sodium 143   Lipid Panel    Component Value Date/Time   CHOL 140 09/21/2018 1203   TRIG 130 09/21/2018 1203   HDL 45 (L) 09/21/2018 1203   CHOLHDL 3.1 09/21/2018 1203  VLDL 20 03/26/2017 0908   LDLCALC 74 09/21/2018 1203     Wt Readings from Last 3 Encounters:  09/30/18 93.9 kg  09/21/18 95.7 kg  07/14/18 96.2 kg     Other studies Reviewed: Additional studies/ records that were reviewed today include: . Review of the above records demonstrates:    Assessment and Plan:   1. Severe Aortic stenosis: Her aortic valve is thickened and calcified with limited leaflet mobility. Mean gradient 37 mmHg. Peak gradient 66 mmHg, AVA 0.88 cm2.  I have personally reviewed the echo images. She is now having progressive dyspnea, fatigue and dizziness. She has severe, stage D aortic valve stenosis. I think she would benefit from AVR. I think she [place] be a good candidate for TAVR.   I have reviewed the natural history of aortic stenosis with the patient and their family members  who are present today. We have discussed the limitations of medical therapy and the poor prognosis associated with symptomatic aortic stenosis. We have reviewed potential treatment options, including palliative medical therapy, conventional surgical aortic valve replacement, and transcatheter aortic valve replacement. We discussed treatment options in the context of the  patient's specific comorbid medical conditions.   She would like to proceed with planning for TAVR. I will arrange a right and left heart catheterization at [facility] 10/07/18 at 10am. Risks and benefits of procedure reviewed with the patient. After the cath, she will have a gated cardiac CT, CTA of the chest/abdomen and pelvis, PFTs, carotid dopplers and PT assessment and will then be referred to see one of the CT surgeons on our TAVR team.   2. Tobacco abuse: Smoking cessation is advised.   Current medicines are reviewed at length with the patient today.  The patient does not have concerns regarding medicines.  The following changes have been made:  no change  Labs/ tests ordered today include:   Orders Placed This Encounter  Procedures  . EKG 12-Lead     Disposition:   FU with the TAVR team.   Signed, [name], MD 09/30/2018 4:05 PM    [address], [place], [place]  [address] Phone: [phone]; Fax: [phone]

## 2018-10-01 ENCOUNTER — Other Ambulatory Visit: Payer: Self-pay | Admitting: Family Medicine

## 2018-10-01 MED ORDER — FLUCONAZOLE 150 MG PO TABS: 150.0000 mg | ORAL_TABLET | Freq: Once | ORAL | 0 refills | Status: AC

## 2018-10-01 NOTE — Telephone Encounter (Signed)
Pt aware of med sent via vm

## 2018-10-01 NOTE — Telephone Encounter (Signed)
I sent diflucan to [facility] on [address]

## 2018-10-06 ENCOUNTER — Telehealth: Payer: Self-pay | Admitting: *Deleted

## 2018-10-06 NOTE — Telephone Encounter (Addendum)
Pt contacted pre-catheterization scheduled at [facility] for: Wednesday October 07, 2018 10 AM Verified arrival time and place: [place] Entrance A at: 8 AM  No solid food after midnight prior to cath, clear liquids until 5 AM day of procedure. Contrast allergy: no  Hold: Metformin-day of procedure and 48 hours post procedure.-pt states not currently taking. Furosemide-AM of procedure. KCl-AM of procedure.  Except hold medications AM meds can be  taken pre-cath with sip of water including: ASA 81 mg  Confirm patient has responsible person to drive home post procedure and observe 24 hours after arriving home.  LMTCB to review instructions with patient.

## 2018-10-06 NOTE — Telephone Encounter (Signed)
Left detailed message (DPR) at home number listed with review of instructions, provided my name/number to call if questions.

## 2018-10-07 ENCOUNTER — Encounter (HOSPITAL_COMMUNITY)
Admission: RE | Disposition: A | Discharge status: Home / Self Care | Payer: Self-pay | Source: Home / Self Care | Attending: Cardiovascular Disease

## 2018-10-07 ENCOUNTER — Encounter (HOSPITAL_COMMUNITY): Payer: Self-pay | Admitting: Cardiovascular Disease

## 2018-10-07 ENCOUNTER — Other Ambulatory Visit: Payer: Self-pay

## 2018-10-07 ENCOUNTER — Ambulatory Visit (HOSPITAL_COMMUNITY)
Admission: RE | Admit: 2018-10-07 | Discharge: 2018-10-07 | Disposition: A | Discharge status: Home / Self Care | Payer: Medicare HMO | Attending: Cardiovascular Disease | Admitting: Cardiovascular Disease

## 2018-10-07 DIAGNOSIS — Z880 Allergy status to penicillin: Secondary | ICD-10-CM | POA: Insufficient documentation

## 2018-10-07 DIAGNOSIS — F17210 Nicotine dependence, cigarettes, uncomplicated: Secondary | ICD-10-CM | POA: Diagnosis not present

## 2018-10-07 DIAGNOSIS — K7460 Unspecified cirrhosis of liver: Secondary | ICD-10-CM | POA: Diagnosis not present

## 2018-10-07 DIAGNOSIS — Z888 Allergy status to other drugs, medicaments and biological substances status: Secondary | ICD-10-CM | POA: Insufficient documentation

## 2018-10-07 DIAGNOSIS — E559 Vitamin D deficiency, unspecified: Secondary | ICD-10-CM | POA: Insufficient documentation

## 2018-10-07 DIAGNOSIS — Z7984 Long term (current) use of oral hypoglycemic drugs: Secondary | ICD-10-CM | POA: Insufficient documentation

## 2018-10-07 DIAGNOSIS — I350 Nonrheumatic aortic (valve) stenosis: Secondary | ICD-10-CM | POA: Insufficient documentation

## 2018-10-07 DIAGNOSIS — E119 Type 2 diabetes mellitus without complications: Secondary | ICD-10-CM | POA: Diagnosis not present

## 2018-10-07 DIAGNOSIS — D649 Anemia, unspecified: Secondary | ICD-10-CM | POA: Insufficient documentation

## 2018-10-07 DIAGNOSIS — F0390 Unspecified dementia without behavioral disturbance: Secondary | ICD-10-CM | POA: Insufficient documentation

## 2018-10-07 DIAGNOSIS — Z79899 Other long term (current) drug therapy: Secondary | ICD-10-CM | POA: Diagnosis not present

## 2018-10-07 DIAGNOSIS — I2510 Atherosclerotic heart disease of native coronary artery without angina pectoris: Secondary | ICD-10-CM

## 2018-10-07 DIAGNOSIS — E079 Disorder of thyroid, unspecified: Secondary | ICD-10-CM | POA: Insufficient documentation

## 2018-10-07 DIAGNOSIS — F419 Anxiety disorder, unspecified: Secondary | ICD-10-CM | POA: Diagnosis not present

## 2018-10-07 DIAGNOSIS — I25708 Atherosclerosis of coronary artery bypass graft(s), unspecified, with other forms of angina pectoris: Secondary | ICD-10-CM | POA: Insufficient documentation

## 2018-10-07 DIAGNOSIS — E669 Obesity, unspecified: Secondary | ICD-10-CM | POA: Diagnosis not present

## 2018-10-07 DIAGNOSIS — Z6834 Body mass index (BMI) 34.0-34.9, adult: Secondary | ICD-10-CM | POA: Insufficient documentation

## 2018-10-07 DIAGNOSIS — I10 Essential (primary) hypertension: Secondary | ICD-10-CM | POA: Insufficient documentation

## 2018-10-07 DIAGNOSIS — K3189 Other diseases of stomach and duodenum: Secondary | ICD-10-CM | POA: Insufficient documentation

## 2018-10-07 DIAGNOSIS — K589 Irritable bowel syndrome without diarrhea: Secondary | ICD-10-CM | POA: Diagnosis not present

## 2018-10-07 DIAGNOSIS — E785 Hyperlipidemia, unspecified: Secondary | ICD-10-CM | POA: Insufficient documentation

## 2018-10-07 DIAGNOSIS — K766 Portal hypertension: Secondary | ICD-10-CM | POA: Diagnosis not present

## 2018-10-07 DIAGNOSIS — K7581 Nonalcoholic steatohepatitis (NASH): Secondary | ICD-10-CM | POA: Diagnosis not present

## 2018-10-07 HISTORY — PX: RIGHT/LEFT HEART CATH AND CORONARY ANGIOGRAPHY: CATH118266

## 2018-10-07 LAB — POCT I-STAT 7, (LYTES, BLD GAS, ICA,H+H)
ACID-BASE DEFICIT: 2 mmol/L (ref 0.0–2.0)
Bicarbonate: 23.1 mmol/L (ref 20.0–28.0)
Calcium, Ion: 1.18 mmol/L (ref 1.15–1.40)
HCT: 26 % — ABNORMAL LOW (ref 36.0–46.0)
Hemoglobin: 8.8 g/dL — ABNORMAL LOW (ref 12.0–15.0)
O2 Saturation: 96 %
PH ART: 7.376 (ref 7.350–7.450)
Potassium: 4.1 mmol/L (ref 3.5–5.1)
Sodium: 142 mmol/L (ref 135–145)
TCO2: 24 mmol/L (ref 22–32)
pCO2 arterial: 39.5 mmHg (ref 32.0–48.0)
pO2, Arterial: 84 mmHg (ref 83.0–108.0)

## 2018-10-07 LAB — POCT I-STAT EG7
ACID-BASE DEFICIT: 2 mmol/L (ref 0.0–2.0)
Bicarbonate: 23.7 mmol/L (ref 20.0–28.0)
CALCIUM ION: 1.16 mmol/L (ref 1.15–1.40)
HCT: 24 % — ABNORMAL LOW (ref 36.0–46.0)
Hemoglobin: 8.2 g/dL — ABNORMAL LOW (ref 12.0–15.0)
O2 Saturation: 74 %
Potassium: 4.1 mmol/L (ref 3.5–5.1)
Sodium: 143 mmol/L (ref 135–145)
TCO2: 25 mmol/L (ref 22–32)
pCO2, Ven: 42.8 mmHg — ABNORMAL LOW (ref 44.0–60.0)
pH, Ven: 7.351 (ref 7.250–7.430)
pO2, Ven: 41 mmHg (ref 32.0–45.0)

## 2018-10-07 SURGERY — RIGHT/LEFT HEART CATH AND CORONARY ANGIOGRAPHY
Anesthesia: LOCAL

## 2018-10-07 MED ORDER — ONDANSETRON HCL 4 MG/2ML IJ SOLN: 4.0000 mg | Freq: Four times a day (QID) | INTRAMUSCULAR | Status: DC | PRN

## 2018-10-07 MED ORDER — FENTANYL CITRATE (PF) 100 MCG/2ML IJ SOLN
INTRAMUSCULAR | Status: DC | PRN
  Administered 2018-10-07: 25 ug via INTRAVENOUS

## 2018-10-07 MED ORDER — HEPARIN SODIUM (PORCINE) 1000 UNIT/ML IJ SOLN
INTRAMUSCULAR | Status: DC | PRN
  Administered 2018-10-07: 4500 [IU] via INTRAVENOUS

## 2018-10-07 MED ORDER — SODIUM CHLORIDE 0.9 % IV SOLN
INTRAVENOUS | Status: AC
  Administered 2018-10-07: 09:00:00 via INTRAVENOUS

## 2018-10-07 MED ORDER — SODIUM CHLORIDE 0.9 % IV SOLN: 250.0000 mL | INTRAVENOUS | Status: DC | PRN

## 2018-10-07 MED ORDER — MIDAZOLAM HCL 2 MG/2ML IJ SOLN
INTRAMUSCULAR | Status: AC
  Filled 2018-10-07: qty 2

## 2018-10-07 MED ORDER — VERAPAMIL HCL 2.5 MG/ML IV SOLN
INTRAVENOUS | Status: AC
  Filled 2018-10-07: qty 2

## 2018-10-07 MED ORDER — FENTANYL CITRATE (PF) 100 MCG/2ML IJ SOLN
INTRAMUSCULAR | Status: AC
  Filled 2018-10-07: qty 2

## 2018-10-07 MED ORDER — ASPIRIN 81 MG PO CHEW: 81.0000 mg | CHEWABLE_TABLET | ORAL | Status: DC

## 2018-10-07 MED ORDER — ACETAMINOPHEN 325 MG PO TABS: 650.0000 mg | ORAL_TABLET | ORAL | Status: DC | PRN

## 2018-10-07 MED ORDER — LIDOCAINE HCL (PF) 1 % IJ SOLN
INTRAMUSCULAR | Status: AC
  Filled 2018-10-07: qty 30

## 2018-10-07 MED ORDER — MIDAZOLAM HCL 2 MG/2ML IJ SOLN
INTRAMUSCULAR | Status: DC | PRN
  Administered 2018-10-07: 1 mg via INTRAVENOUS

## 2018-10-07 MED ORDER — HEPARIN (PORCINE) IN NACL 1000-0.9 UT/500ML-% IV SOLN
INTRAVENOUS | Status: AC
  Filled 2018-10-07: qty 1000

## 2018-10-07 MED ORDER — SODIUM CHLORIDE 0.9% FLUSH: 3.0000 mL | Freq: Two times a day (BID) | INTRAVENOUS | Status: DC

## 2018-10-07 MED ORDER — HEPARIN (PORCINE) IN NACL 1000-0.9 UT/500ML-% IV SOLN
INTRAVENOUS | Status: DC | PRN
  Administered 2018-10-07 (×2): 500 mL

## 2018-10-07 MED ORDER — LIDOCAINE HCL (PF) 1 % IJ SOLN
INTRAMUSCULAR | Status: DC | PRN
  Administered 2018-10-07 (×2): 2 mL

## 2018-10-07 MED ORDER — IOHEXOL 350 MG/ML SOLN
INTRAVENOUS | Status: DC | PRN
  Administered 2018-10-07: 120 mL via INTRAVENOUS

## 2018-10-07 MED ORDER — VERAPAMIL HCL 2.5 MG/ML IV SOLN
INTRAVENOUS | Status: DC | PRN
  Administered 2018-10-07: 11:00:00 via INTRA_ARTERIAL

## 2018-10-07 MED ORDER — SODIUM CHLORIDE 0.9 % IV SOLN: INTRAVENOUS | Status: AC

## 2018-10-07 MED ORDER — SODIUM CHLORIDE 0.9% FLUSH: 3.0000 mL | INTRAVENOUS | Status: DC | PRN

## 2018-10-07 SURGICAL SUPPLY — 14 items
CATH 5FR JL3.5 JR4 ANG PIG MP (CATHETERS) ×1 IMPLANT
CATH BALLN WEDGE 5F 110CM (CATHETERS) ×1 IMPLANT
CATH INFINITI 5FR AL1 (CATHETERS) ×1 IMPLANT
CATH VISTA GUIDE 6FR XBLAD3.5 (CATHETERS) ×1 IMPLANT
DEVICE RAD COMP TR BAND LRG (VASCULAR PRODUCTS) ×1 IMPLANT
GLIDESHEATH SLEND SS 6F .021 (SHEATH) ×1 IMPLANT
GUIDEWIRE INQWIRE 1.5J.035X260 (WIRE) IMPLANT
INQWIRE 1.5J .035X260CM (WIRE) ×2
KIT HEART LEFT (KITS) ×2 IMPLANT
PACK CARDIAC CATHETERIZATION (CUSTOM PROCEDURE TRAY) ×2 IMPLANT
SHEATH GLIDE SLENDER 4/5FR (SHEATH) ×1 IMPLANT
TRANSDUCER W/STOPCOCK (MISCELLANEOUS) ×2 IMPLANT
TUBING CIL FLEX 10 FLL-RA (TUBING) ×2 IMPLANT
WIRE EMERALD ST .035X150CM (WIRE) ×1 IMPLANT

## 2018-10-07 NOTE — Discharge Instructions (Signed)
Radial Site Care ° °This sheet gives you information about how to care for yourself after your procedure. Your health care provider Corro also give you more specific instructions. If you have problems or questions, contact your health care provider. °What can I expect after the procedure? °After the procedure, it is common to have: °· Bruising and tenderness at the catheter insertion area. °Follow these instructions at home: °Medicines °· Take over-the-counter and prescription medicines only as told by your health care provider. °Insertion site care °· Follow instructions from your health care provider about how to take care of your insertion site. Make sure you: °? Wash your hands with soap and water before you change your bandage (dressing). If soap and water are not available, use hand sanitizer. °? Change your dressing as told by your health care provider. °? Leave stitches (sutures), skin glue, or adhesive strips in place. These skin closures Bastin need to stay in place for 2 weeks or longer. If adhesive strip edges start to loosen and curl up, you Allender trim the loose edges. Do not remove adhesive strips completely unless your health care provider tells you to do that. °· Check your insertion site every day for signs of infection. Check for: °? Redness, swelling, or pain. °? Fluid or blood. °? Pus or a bad smell. °? Warmth. °· Do not take baths, swim, or use a hot tub until your health care provider approves. °· You Delarosa shower 24-48 hours after the procedure, or as directed by your health care provider. °? Remove the dressing and gently wash the site with plain soap and water. °? Pat the area dry with a clean towel. °? Do not rub the site. That could cause bleeding. °· Do not apply powder or lotion to the site. °Activity ° °· For 24 hours after the procedure, or as directed by your health care provider: °? Do not flex or bend the affected arm. °? Do not push or pull heavy objects with the affected arm. °? Do not  drive yourself home from the hospital or clinic. You Mozer drive 24 hours after the procedure unless your health care provider tells you not to. °? Do not operate machinery or power tools. °· Do not lift anything that is heavier than 10 lb (4.5 kg), or the limit that you are told, until your health care provider says that it is safe. °· Ask your health care provider when it is okay to: °? Return to work or school. °? Resume usual physical activities or sports. °? Resume sexual activity. °General instructions °· If the catheter site starts to bleed, raise your arm and put firm pressure on the site. If the bleeding does not stop, get help right away. This is a medical emergency. °· If you went home on the same day as your procedure, a responsible adult should be with you for the first 24 hours after you arrive home. °· Keep all follow-up visits as told by your health care provider. This is important. °Contact a health care provider if: °· You have a fever. °· You have redness, swelling, or yellow drainage around your insertion site. °Get help right away if: °· You have unusual pain at the radial site. °· The catheter insertion area swells very fast. °· The insertion area is bleeding, and the bleeding does not stop when you hold steady pressure on the area. °· Your arm or hand becomes pale, cool, tingly, or numb. °These symptoms Ogren represent a serious problem   that is an emergency. Do not wait to see if the symptoms will go away. Get medical help right away. Call your local emergency services (911 in the [place] Do not drive yourself to the hospital. °Summary °· After the procedure, it is common to have bruising and tenderness at the site. °· Follow instructions from your health care provider about how to take care of your radial site wound. Check the wound every day for signs of infection. °· Do not lift anything that is heavier than 10 lb (4.5 kg), or the limit that you are told, until your health care provider says  that it is safe. °This information is not intended to replace advice given to you by your health care provider. Make sure you discuss any questions you have with your health care provider. °Document Released: 08/24/2010 Document Revised: 08/27/2017 Document Reviewed: 08/27/2017 °Elsevier Interactive Patient Education © 2019 [employer] ° °

## 2018-10-07 NOTE — Interval H&P Note (Signed)
History and Physical Interval Note:  10/07/2018 10:24 AM  Mitchell Bessler  has presented today for cardiac cath with severe aortic stenosis.  The various methods of treatment have been discussed with the patient and family. After consideration of risks, benefits and other options for treatment, the patient has consented to  Procedure(s): RIGHT/LEFT HEART CATH AND CORONARY ANGIOGRAPHY (N/A) as a surgical intervention .  The patient's history has been reviewed, patient examined, no change in status, stable for surgery.  I have reviewed the patient's chart and labs.  Questions were answered to the patient's satisfaction.    Cath Lab Visit (complete for each Cath Lab visit)  Clinical Evaluation Leading to the Procedure:   ACS: No.  Non-ACS:    Anginal Classification: CCS II  Anti-ischemic medical therapy: Maximal Therapy (2 or more classes of medications)  Non-Invasive Test Results: No non-invasive testing performed  Prior CABG: No previous CABG         [name]

## 2018-10-08 ENCOUNTER — Other Ambulatory Visit: Payer: Self-pay

## 2018-10-08 ENCOUNTER — Encounter: Payer: Self-pay | Admitting: Cardiovascular Disease

## 2018-10-08 DIAGNOSIS — I350 Nonrheumatic aortic (valve) stenosis: Secondary | ICD-10-CM

## 2018-10-08 NOTE — Telephone Encounter (Signed)
This encounter was created in error - please disregard.

## 2018-10-08 NOTE — Telephone Encounter (Signed)
° °  Patient's spouse calling upset because he has not received a call to a schedule procedure for his wife. Mr. Zilberman states he is waiting to hear from " Lauren"  Please advise

## 2018-10-09 ENCOUNTER — Ambulatory Visit: Payer: Medicare HMO | Admitting: Family Medicine

## 2018-10-12 ENCOUNTER — Ambulatory Visit (INDEPENDENT_AMBULATORY_CARE_PROVIDER_SITE_OTHER): Payer: Medicare HMO | Admitting: Family Medicine

## 2018-10-12 ENCOUNTER — Encounter: Payer: Self-pay | Admitting: Family Medicine

## 2018-10-12 VITALS — BP 120/60 | HR 46 | Temp 97.7°F | Resp 18 | Ht 64.5 in | Wt 216.0 lb

## 2018-10-12 DIAGNOSIS — H9193 Unspecified hearing loss, bilateral: Secondary | ICD-10-CM

## 2018-10-12 DIAGNOSIS — S92902D Unspecified fracture of left foot, subsequent encounter for fracture with routine healing: Secondary | ICD-10-CM | POA: Diagnosis not present

## 2018-10-12 DIAGNOSIS — I350 Nonrheumatic aortic (valve) stenosis: Secondary | ICD-10-CM | POA: Diagnosis not present

## 2018-10-12 NOTE — Progress Notes (Signed)
Subjective:    Patient ID: Kelly [name], female    DOB: June 03, 1951, 68 y.o.   MRN: [id number] Patient recently underwent coronary artery catheterization and was found to have three-vessel coronary artery disease in addition to her severe aortic stenosis.  At the present time cardiology and cardiothoracic surgery are discussing operative repair of her aortic stenosis and CABG.  She is scheduled to meet with the cardiothoracic surgeons within the next week or so.  She is here today to asked my opinion regarding this.  She also reports hearing loss in both ears.  She has a cerumen impaction in both ears however she is unable to undergo irrigation and lavage of the right ear due to a chronically perforated right eardrum.  She is requesting irrigation and lavage in her left ear.  When I last saw the patient she was also found to have a fracture in her left foot.  The swelling and pain has improved dramatically.  She is now able to walk without any pain or discomfort in the third or fourth metatarsals.  There is no tenderness to palpation Past Medical History:  Diagnosis Date  . Acute encephalopathy 09/14/2016  . Adenomatous polyp   . Anxiety   . Aortic stenosis   . Arrhythmia   . Chronic upper GI bleeding    intestinal AVM's, portal gastropathy, esophageal varices on egd 04/2018  . Dementia ([place]) 09/16/2016  . DIVERTICULITIS OF COLON 06/14/2008   Qualifier: Diagnosis of  By: [name]    . DM (diabetes mellitus), type 2 ([place]) 09/15/2016  . Esophageal varices determined by endoscopy ([place])   . Fatty liver   . Hyperlipidemia   . Hypertension   . Irritable bowel syndrome 04/06/2008   Centricity Description: IBS Qualifier: Diagnosis of  By: [name] CMA ([name]), [name]   Centricity Description: IRRITABLE BOWEL SYNDROME Qualifier: Diagnosis of  By: [name] MD, [name]   . Liver cirrhosis secondary to NASH ([place]) 09/16/2016  . NASH (nonalcoholic steatohepatitis)   . Obesity   . Portal hypertensive gastropathy  ([place])   . SOB (shortness of breath)   . Splenomegaly 09/16/2016  . Thrombocytopenia ([place])   . Thyroid disease   . Vitamin B 12 deficiency 09/16/2016  . Vitamin D deficiency    Past Surgical History:  Procedure Laterality Date  . COLONOSCOPY WITH PROPOFOL N/A 04/09/2018   Procedure: COLONOSCOPY WITH PROPOFOL;  Surgeon: [name], MD;  Location: [facility];  Service: Endoscopy;  Laterality: N/A;  . ESOPHAGOGASTRODUODENOSCOPY (EGD) WITH PROPOFOL N/A 04/09/2018   Procedure: ESOPHAGOGASTRODUODENOSCOPY (EGD) WITH PROPOFOL;  Surgeon: [name], MD;  Location: [facility];  Service: Endoscopy;  Laterality: N/A;  . oopherectomy    . OVARIAN CYST REMOVAL    . POLYPECTOMY  04/09/2018   Procedure: POLYPECTOMY;  Surgeon: [name], MD;  Location: [facility];  Service: Endoscopy;;  . RIGHT/LEFT HEART CATH AND CORONARY ANGIOGRAPHY N/A 10/07/2018   Procedure: RIGHT/LEFT HEART CATH AND CORONARY ANGIOGRAPHY;  Surgeon: [name], MD;  Location: [facility];  Service: Cardiovascular;  Laterality: N/A;   Current Outpatient Medications on File Prior to Visit  Medication Sig Dispense Refill  . ACCU-CHEK AVIVA PLUS test strip     . ACCU-CHEK SOFTCLIX LANCETS lancets     . amLODipine (NORVASC) 10 MG tablet Take 1 tablet (10 mg total) by mouth daily. 90 tablet 3  . atorvastatin (LIPITOR) 20 MG tablet TAKE 1 TABLET (20 MG TOTAL) BY MOUTH DAILY. 90 tablet  3  . Blood Glucose Monitoring Suppl (ACCU-CHEK AVIVA PLUS) w/Device KIT     . ferrous sulfate 325 (65 FE) MG tablet Take 1 tablet (325 mg total) by mouth daily. 90 tablet 3  . furosemide (LASIX) 20 MG tablet Take 1 tablet (20 mg total) by mouth daily. 90 tablet 3  . levothyroxine (SYNTHROID, LEVOTHROID) 75 MCG tablet TAKE 1 TABLET EVERY DAY (Patient taking differently: Take 75 mcg by mouth daily. ) 90 tablet 3  . losartan (COZAAR) 100 MG tablet Take 1 tablet (100 mg total) by mouth daily. Stop lisinopril hctz (Patient taking  differently: Take 100 mg by mouth daily. ) 90 tablet 3  . metFORMIN (GLUCOPHAGE-XR) 500 MG 24 hr tablet TAKE 2 TABLETS TWICE DAILY 360 tablet 1  . methylcellulose (CITRUCEL) oral powder Take 1 packet by mouth daily as needed (constipation).    . naproxen sodium (ALEVE) 220 MG tablet Take 220-440 mg by mouth daily as needed (pain).    . pantoprazole (PROTONIX) 40 MG tablet Take 40 mg by mouth daily.    . potassium chloride SA (K-DUR,KLOR-CON) 20 MEQ tablet Take 1 tablet (20 mEq total) by mouth daily. 90 tablet 3  . propranolol (INDERAL) 40 MG tablet TAKE 1 TABLET TWICE DAILY (Patient taking differently: Take 40 mg by mouth 2 (two) times daily. ) 180 tablet 0  . traZODone (DESYREL) 50 MG tablet TAKE 1 TABLET AT BEDTIME AS NEEDED FOR SLEEP (Patient taking differently: Take 50 mg by mouth at bedtime as needed for sleep. ) 90 tablet 1  . zolpidem (AMBIEN) 5 MG tablet TAKE 1 TABLET BY MOUTH AT BEDTIME AS NEEDED FOR SLEEP (Patient taking differently: Take 5 mg by mouth at bedtime as needed for sleep. ) 30 tablet 2   Current Facility-Administered Medications on File Prior to Visit  Medication Dose Route Frequency Provider Last Rate Last Dose  . cyanocobalamin ((VITAMIN B-12)) injection 1,000 mcg  1,000 mcg Subcutaneous Q30 days [name], MD   1,000 mcg at 09/08/17 1408   Allergies  Allergen Reactions  . Celecoxib     Unknown reaction  . Prednisone Itching    Heart rate increased  . Macrodantin [Nitrofurantoin Macrocrystal] Rash  . Penicillins Hives    Did it involve swelling of the face/tongue/throat, SOB, or low BP? Unknown Did it involve sudden or severe rash/hives, skin peeling, or any reaction on the inside of your mouth or nose? Unknown Did you need to seek medical attention at a hospital or doctor's office? Unknown When did it last happen?20 + years If all above answers are "NO", [place] proceed with cephalosporin use.    Social History   Socioeconomic History  . Marital  status: Married    Spouse name: Not on file  . Number of children: Not on file  . Years of education: Not on file  . Highest education level: Not on file  Occupational History  . Not on file  Social Needs  . Financial resource strain: Not on file  . Food insecurity:    Worry: Not on file    Inability: Not on file  . Transportation needs:    Medical: Not on file    Non-medical: Not on file  Tobacco Use  . Smoking status: Current Every Day Smoker    Packs/day: 0.50    Types: Cigarettes  . Smokeless tobacco: Never Used  Substance and Sexual Activity  . Alcohol use: No  . Drug use: No  . Sexual activity: Not Currently  Lifestyle  . Physical activity:    Days per week: Not on file    Minutes per session: Not on file  . Stress: Not on file  Relationships  . Social connections:    Talks on phone: Not on file    Gets together: Not on file    Attends religious service: Not on file    Active member of club or organization: Not on file    Attends meetings of clubs or organizations: Not on file    Relationship status: Not on file  . Intimate partner violence:    Fear of current or ex partner: Not on file    Emotionally abused: Not on file    Physically abused: Not on file    Forced sexual activity: Not on file  Other Topics Concern  . Not on file  Social History Narrative  . Not on file   Family History  Problem Relation Age of Onset  . Lung cancer Father   . Heart disease Father        MI at age 100  . Alcohol abuse Father   . Alzheimer's disease Mother   . Dementia Mother   . Other Mother        twisted colon   . Stomach cancer Neg Hx   . Colon cancer Neg Hx       Review of Systems  All other systems reviewed and are negative.      Objective:   Physical Exam  Constitutional: She is oriented to person, place, and time. She appears well-developed and well-nourished.  HENT:  Right Ear: External ear normal.  Left Ear: External ear normal.  Nose: Nose normal.   Mouth/Throat: Oropharynx is clear and moist. No oropharyngeal exudate.  Eyes: Pupils are equal, round, and reactive to light. Conjunctivae and EOM are normal.  Neck: Neck supple. No JVD present. No thyromegaly present.  Cardiovascular: Normal rate and regular rhythm.  Murmur heard.  Systolic murmur is present with a grade of 3/6. Pulmonary/Chest: Effort normal. No respiratory distress. She has no wheezes. She has no rales.  Abdominal: Soft. Bowel sounds are normal. She exhibits no distension. There is no abdominal tenderness. There is no rebound and no guarding.  Musculoskeletal:        General: No edema.     Left foot: No tenderness or swelling.  Lymphadenopathy:    She has no cervical adenopathy.  Neurological: She is alert and oriented to person, place, and time. She has normal reflexes. No cranial nerve deficit. She exhibits normal muscle tone. Coordination normal.  Psychiatric: She has a normal mood and affect. Her behavior is normal. Judgment and thought content normal.  Vitals reviewed.         Assessment & Plan:  Closed fracture of left foot with routine healing, subsequent encounter  Aortic valve stenosis, etiology of cardiac valve disease unspecified  Hearing loss associated with syndrome of both ears  Pain and swelling in the left foot has almost completely resolved.  At this point she can discontinue the cam walker and use a hard soled shoe and ambulate as tolerated.  Hearing loss is improved in the left ear with irrigation and lavage removing the cerumen impaction.  Patient elected against instrumental removal of the cerumen in the right ear due to pain.  She is unable to tolerate irrigation and lavage of the right ear due to a chronically perforated eardrum.  I recommended follow-up with the cardiothoracic surgeons later this week.  I  believe the patient would do fine with a aortic valve repair and CABG despite her mild dementia.  At this point her dementia is mild and I  see no medical contraindications to proceed with the upcoming surgery which appears to be medically necessary

## 2018-10-14 ENCOUNTER — Ambulatory Visit (HOSPITAL_COMMUNITY)
Admission: RE | Admit: 2018-10-14 | Discharge: 2018-10-14 | Disposition: A | Discharge status: Home / Self Care | Payer: Medicare HMO | Source: Ambulatory Visit | Attending: Cardiovascular Disease | Admitting: Cardiovascular Disease

## 2018-10-14 ENCOUNTER — Other Ambulatory Visit (HOSPITAL_COMMUNITY): Payer: Self-pay | Admitting: Radiology

## 2018-10-14 DIAGNOSIS — R911 Solitary pulmonary nodule: Secondary | ICD-10-CM

## 2018-10-14 DIAGNOSIS — I350 Nonrheumatic aortic (valve) stenosis: Secondary | ICD-10-CM | POA: Diagnosis not present

## 2018-10-14 DIAGNOSIS — K5730 Diverticulosis of large intestine without perforation or abscess without bleeding: Secondary | ICD-10-CM | POA: Diagnosis not present

## 2018-10-14 DIAGNOSIS — Z952 Presence of prosthetic heart valve: Secondary | ICD-10-CM | POA: Diagnosis not present

## 2018-10-14 DIAGNOSIS — K7460 Unspecified cirrhosis of liver: Secondary | ICD-10-CM | POA: Diagnosis not present

## 2018-10-14 HISTORY — DX: Solitary pulmonary nodule: R91.1

## 2018-10-14 MED ORDER — IOHEXOL 350 MG/ML SOLN
100.0000 mL | Freq: Once | INTRAVENOUS | Status: AC | PRN
  Administered 2018-10-14: 100 mL via INTRAVENOUS

## 2018-10-15 ENCOUNTER — Encounter: Payer: Medicare HMO | Admitting: Surgery

## 2018-10-16 ENCOUNTER — Telehealth: Payer: Self-pay | Admitting: Family Medicine

## 2018-10-16 ENCOUNTER — Other Ambulatory Visit: Payer: Self-pay | Admitting: Family Medicine

## 2018-10-16 MED ORDER — AZITHROMYCIN 250 MG PO TABS: ORAL_TABLET | ORAL | 0 refills | Status: DC

## 2018-10-16 NOTE — Telephone Encounter (Signed)
I sent zpack to her pharmacy.

## 2018-10-16 NOTE — Telephone Encounter (Signed)
Pt aware.

## 2018-10-16 NOTE — Telephone Encounter (Signed)
Pt wants to know if you will call her out an antibx as she thinks she has a sinus infection and she is scheduled for surgery next week.

## 2018-10-19 ENCOUNTER — Institutional Professional Consult (permissible substitution): Payer: Medicare HMO | Admitting: Thoracic Surgery (Cardiothoracic Vascular Surgery)

## 2018-10-19 ENCOUNTER — Other Ambulatory Visit: Payer: Self-pay

## 2018-10-19 ENCOUNTER — Encounter: Payer: Self-pay | Admitting: Thoracic Surgery (Cardiothoracic Vascular Surgery)

## 2018-10-19 ENCOUNTER — Other Ambulatory Visit: Payer: Self-pay | Admitting: *Deleted

## 2018-10-19 ENCOUNTER — Encounter: Payer: Medicare HMO | Admitting: Surgery

## 2018-10-19 VITALS — BP 115/62 | HR 60 | Resp 20 | Ht 64.5 in | Wt 219.0 lb

## 2018-10-19 DIAGNOSIS — R911 Solitary pulmonary nodule: Secondary | ICD-10-CM

## 2018-10-19 DIAGNOSIS — I2510 Atherosclerotic heart disease of native coronary artery without angina pectoris: Secondary | ICD-10-CM | POA: Diagnosis not present

## 2018-10-19 DIAGNOSIS — I350 Nonrheumatic aortic (valve) stenosis: Secondary | ICD-10-CM

## 2018-10-19 DIAGNOSIS — K7460 Unspecified cirrhosis of liver: Secondary | ICD-10-CM

## 2018-10-19 NOTE — Progress Notes (Signed)
HEART AND [place] SURGERY CONSULTATION REPORT  Primary Cardiologist is [name], MD PCP is [name], MD  Chief Complaint  Patient presents with   Aortic Stenosis    Surgical eval, Cardiac Cath 10/07/18, ECHO 08/27/18, CT's 10/14/18   Coronary Artery Disease    HPI:  Patient is a 68 year old moderately obese female with history of aortic stenosis, coronary artery disease, hypertension, hyperlipidemia, NASH cirrhosis with portal hypertension, esophageal varices, type 2 diabetes mellitus, tobacco abuse, anxiety, and dementia who has been referred for surgical consultation to discuss treatment options for management of severe symptomatic aortic stenosis and multivessel coronary artery disease.  Patient has known of presence of a heart murmur for several years and was initially evaluated by Dr. Angelena Form in 2018.  Echocardiogram performed at that time revealed normal left ventricular systolic function with moderate aortic stenosis.  She has been followed regularly ever since.  She was seen in follow-up recently and reported progressive symptoms of exertional shortness of breath and fatigue with occasional mild dizzy spells.  Follow-up echocardiogram revealed moderately severe aortic stenosis with peak velocity across the aortic valve measured 4.0 m/s corresponding to mean transvalvular gradient estimated 37 mmHg.  The DVI was reported 0.31 with aortic valve area calculated 0.9 cm.  Left ventricular systolic function remain normal with ejection fraction estimated 60 to 65%.  The patient subsequently underwent diagnostic cardiac catheterization which confirmed the presence of severe aortic stenosis with mean transvalvular gradient measured 41 mmHg at catheterization.  The patient was found to have multivessel coronary artery disease.  CT angiography was performed and the patient has been referred for surgical  consultation.  Patient is married and lives with her husband in [place].  The patient is accompanied by her son for her office consultation today.  She has been retired for more than 15 to 20 years.  She lives a sedentary lifestyle.  She stopped driving an automobile 6 or 7 years ago.  She states that she is primarily limited by exertional fatigue and shortness of breath.  She recently broke her ankle but has recovered.  She is now ambulating without any sort of mechanical assistance.  She does admit to having unstable balance.  She also reports mild problems with long and short-term memory loss.  She has been diagnosed with dementia.  This reportedly has not progressed.  She states that she gets short of breath with moderate and low-level activity.  This is progressed over the past year or so.  She denies resting shortness of breath.  She states that she is not comfortable laying flat in bed.  She reports occasional mild dizzy spells when she stands up after having been supine for a period of time.  She denies any history of exertional chest pain or chest tightness either with activity or at rest.  She denies any PND, orthopnea, or lower extremity edema.  Past Medical History:  Diagnosis Date   Acute encephalopathy 09/14/2016   Adenomatous polyp    Anxiety    Aortic stenosis    Arrhythmia    Chronic upper GI bleeding    intestinal AVM's, portal gastropathy, esophageal varices on egd 04/2018   Coronary artery disease involving native coronary artery without angina pectoris    Dementia ([place]) 09/16/2016   DIVERTICULITIS OF COLON 06/14/2008   Qualifier: Diagnosis of  By: Gretta Cool RN, Malachy Mood     DM (diabetes mellitus), type 2 ([place]) 09/15/2016   Esophageal varices determined by  endoscopy ([place])    Fatty liver    Hyperlipidemia    Hypertension    Irritable bowel syndrome 04/06/2008   Centricity Description: IBS Qualifier: Diagnosis of  By: [name] CMA ([name]), [name]   Centricity Description:  IRRITABLE BOWEL SYNDROME Qualifier: Diagnosis of  By: [name] MD, [name]    Liver cirrhosis secondary to NASH ([place]) 09/16/2016   NASH (nonalcoholic steatohepatitis)    Obesity    Portal hypertensive gastropathy ([place])    SOB (shortness of breath)    Splenomegaly 09/16/2016   Thrombocytopenia ([place])    Thyroid disease    Vitamin B 12 deficiency 09/16/2016   Vitamin D deficiency     Past Surgical History:  Procedure Laterality Date   COLONOSCOPY WITH PROPOFOL N/A 04/09/2018   Procedure: COLONOSCOPY WITH PROPOFOL;  Surgeon: [name], MD;  Location: [facility];  Service: Endoscopy;  Laterality: N/A;   ESOPHAGOGASTRODUODENOSCOPY (EGD) WITH PROPOFOL N/A 04/09/2018   Procedure: ESOPHAGOGASTRODUODENOSCOPY (EGD) WITH PROPOFOL;  Surgeon: [name], MD;  Location: [facility];  Service: Endoscopy;  Laterality: N/A;   oopherectomy     OVARIAN CYST REMOVAL     POLYPECTOMY  04/09/2018   Procedure: POLYPECTOMY;  Surgeon: [name], MD;  Location: [facility];  Service: Endoscopy;;   RIGHT/LEFT HEART CATH AND CORONARY ANGIOGRAPHY N/A 10/07/2018   Procedure: RIGHT/LEFT HEART CATH AND CORONARY ANGIOGRAPHY;  Surgeon: [name], MD;  Location: [facility];  Service: Cardiovascular;  Laterality: N/A;    Family History  Problem Relation Age of Onset   Lung cancer Father    Heart disease Father        MI at age 57   Alcohol abuse Father    Alzheimer's disease Mother    Dementia Mother    Other Mother        twisted colon    Stomach cancer Neg Hx    Colon cancer Neg Hx     Social History   Socioeconomic History   Marital status: Married    Spouse name: Not on file   Number of children: Not on file   Years of education: Not on file   Highest education level: Not on file  Occupational History   Not on file  Social Needs   Financial resource strain: Not on file   Food insecurity:    Worry: Not on file    Inability: Not on  file   Transportation needs:    Medical: Not on file    Non-medical: Not on file  Tobacco Use   Smoking status: Current Every Day Smoker    Packs/day: 0.50    Types: Cigarettes   Smokeless tobacco: Never Used  Substance and Sexual Activity   Alcohol use: No   Drug use: No   Sexual activity: Not Currently  Lifestyle   Physical activity:    Days per week: Not on file    Minutes per session: Not on file   Stress: Not on file  Relationships   Social connections:    Talks on phone: Not on file    Gets together: Not on file    Attends religious service: Not on file    Active member of club or organization: Not on file    Attends meetings of clubs or organizations: Not on file    Relationship status: Not on file   Intimate partner violence:    Fear of current or ex partner: Not on file    Emotionally abused: Not  on file    Physically abused: Not on file    Forced sexual activity: Not on file  Other Topics Concern   Not on file  Social History Narrative   Not on file    Current Outpatient Medications  Medication Sig Dispense Refill   ACCU-CHEK AVIVA PLUS test strip      ACCU-CHEK SOFTCLIX LANCETS lancets      amLODipine (NORVASC) 10 MG tablet Take 1 tablet (10 mg total) by mouth daily. 90 tablet 3   atorvastatin (LIPITOR) 20 MG tablet TAKE 1 TABLET (20 MG TOTAL) BY MOUTH DAILY. 90 tablet 3   azithromycin (ZITHROMAX) 250 MG tablet 2 tabs poqday1, 1 tab poqday 2-5 6 tablet 0   Blood Glucose Monitoring Suppl (ACCU-CHEK AVIVA PLUS) w/Device KIT      ferrous sulfate 325 (65 FE) MG tablet Take 1 tablet (325 mg total) by mouth daily. 90 tablet 3   furosemide (LASIX) 20 MG tablet Take 1 tablet (20 mg total) by mouth daily. 90 tablet 3   levothyroxine (SYNTHROID, LEVOTHROID) 75 MCG tablet TAKE 1 TABLET EVERY DAY (Patient taking differently: Take 75 mcg by mouth daily. ) 90 tablet 3   losartan (COZAAR) 100 MG tablet Take 1 tablet (100 mg total) by mouth daily. Stop  lisinopril hctz (Patient taking differently: Take 100 mg by mouth daily. ) 90 tablet 3   metFORMIN (GLUCOPHAGE-XR) 500 MG 24 hr tablet TAKE 2 TABLETS TWICE DAILY 360 tablet 1   methylcellulose (CITRUCEL) oral powder Take 1 packet by mouth daily as needed (constipation).     naproxen sodium (ALEVE) 220 MG tablet Take 220-440 mg by mouth daily as needed (pain).     pantoprazole (PROTONIX) 40 MG tablet Take 40 mg by mouth daily.     potassium chloride SA (K-DUR,KLOR-CON) 20 MEQ tablet Take 1 tablet (20 mEq total) by mouth daily. 90 tablet 3   propranolol (INDERAL) 40 MG tablet TAKE 1 TABLET TWICE DAILY (Patient taking differently: Take 40 mg by mouth 2 (two) times daily. ) 180 tablet 0   traZODone (DESYREL) 50 MG tablet TAKE 1 TABLET AT BEDTIME AS NEEDED FOR SLEEP (Patient taking differently: Take 50 mg by mouth at bedtime as needed for sleep. ) 90 tablet 1   zolpidem (AMBIEN) 5 MG tablet TAKE 1 TABLET BY MOUTH AT BEDTIME AS NEEDED FOR SLEEP (Patient taking differently: Take 5 mg by mouth at bedtime as needed for sleep. ) 30 tablet 2   Current Facility-Administered Medications  Medication Dose Route Frequency Provider Last Rate Last Dose   cyanocobalamin ((VITAMIN B-12)) injection 1,000 mcg  1,000 mcg Subcutaneous Q30 days Jenna Luo T, MD   1,000 mcg at 09/08/17 1408    Allergies  Allergen Reactions   Celecoxib     Unknown reaction   Prednisone Itching    Heart rate increased   Macrodantin [Nitrofurantoin Macrocrystal] Rash   Penicillins Hives    Did it involve swelling of the face/tongue/throat, SOB, or low BP? Unknown Did it involve sudden or severe rash/hives, skin peeling, or any reaction on the inside of your mouth or nose? Unknown Did you need to seek medical attention at a hospital or doctor's office? Unknown When did it last happen?20 + years If all above answers are NO, Martha proceed with cephalosporin use.       Review of Systems:   General:  normal  appetite, decreased energy, + weight gain, no weight loss, no fever  Cardiac:  no chest pain with exertion,  no chest pain at rest, + SOB with exertion, no resting SOB, no PND, + orthopnea, no palpitations, no arrhythmia, no atrial fibrillation, no LE edema, + dizzy spells, no syncope  Respiratory:  + exertional shortness of breath, no home oxygen, + productive cough, no dry cough, no bronchitis, no wheezing, no hemoptysis, no asthma, no pain with inspiration or cough, no sleep apnea, no CPAP at night  GI:   mild difficulty swallowing, no reflux, no frequent heartburn, + hiatal hernia, no abdominal pain, + constipation, + diarrhea, no hematochezia, no hematemesis, no melena  GU:   no dysuria,  no frequency, no urinary tract infection, no hematuria, no kidney stones, no kidney disease  Vascular:  no pain suggestive of claudication, + pain in feet, + leg cramps, no varicose veins, no DVT, no non-healing foot ulcer  Neuro:   no stroke, no TIA's, no seizures, + headaches, no temporary blindness one eye,  no slurred speech, no peripheral neuropathy, no chronic pain, mild instability of gait, + memory/cognitive dysfunction  Musculoskeletal: + arthritis, no joint swelling, no myalgias, no difficulty walking, no mobility   Skin:   no rash, no itching, no skin infections, no pressure sores or ulcerations  Psych:   + anxiety, + depression, + nervousness, + unusual recent stress  Eyes:   + blurry vision, + floaters, no recent vision changes, + wears glasses or contacts  ENT:   + hearing loss, no loose or painful teeth, partial dentures, last saw dentist 4 years ago  Hematologic:  no easy bruising, no abnormal bleeding, no clotting disorder, + frequent epistaxis  Endocrine:  + diabetes, does check CBG's at home           Physical Exam:   BP 115/62    Pulse 60    Resp 20    Ht 5' 4.5" (1.638 m)    Wt 219 lb (99.3 kg)    SpO2 97% Comment: RA   BMI 37.01 kg/m   General:  Moderately obese female NAD    HEENT:  Unremarkable   Neck:   no JVD, no bruits, no adenopathy   Chest:   clear to auscultation, symmetrical breath sounds, no wheezes, no rhonchi   CV:   RRR, grade III/VI crescendo/decrescendo murmur heard best at LLSB,  no diastolic murmur  Abdomen:  soft, non-tender, no masses   Extremities:  warm, well-perfused, pulses diminished but palpable, no LE edema  Rectal/GU  Deferred  Neuro:   Grossly non-focal and symmetrical throughout  Skin:   Clean and dry, no rashes, no breakdown   Diagnostic Tests:  EKG: NSR w/out significant AV conduction delay   Transthoracic Echocardiography  Patient:    Helfand, Kinsleigh MR #:       [id number] Study Date: 08/27/2018 Gender:     F Age:        22 Height:     163.8 cm Weight:     96.2 kg BSA:        2.13 m^2 Pt. Status: Room:   ATTENDING    Sonny Dandy, [place]  SONOGRAPHER  Marygrace Drought, RCS  PERFORMING   [facility], Outpatient  cc:  ------------------------------------------------------------------- LV EF: 60% -   65%  ------------------------------------------------------------------- Indications:      Aortic valve disorder (I35.0).  ------------------------------------------------------------------- History:   PMH:  HLD. Acquired from the patient and from the patient&'s chart.  Dyspnea.  Risk factors:  Hypertension. Diabetes  mellitus.  ------------------------------------------------------------------- Study Conclusions  - Left ventricle: The cavity size was normal. There was moderate   concentric hypertrophy. Systolic function was normal. The   estimated ejection fraction was in the range of 60% to 65%. Wall   motion was normal; there were no regional wall motion   abnormalities. Doppler parameters are consistent with abnormal   left ventricular relaxation (grade 1 diastolic dysfunction).   Doppler parameters are consistent with elevated mean  left atrial   filling pressure. - Aortic valve: Valve mobility was restricted. There was moderate   to severe stenosis. Valve area (VTI): 0.88 cm^2. Valve area   (Vmax): 0.94 cm^2. Valve area (Vmean): 0.93 cm^2. - Left atrium: The atrium was mildly dilated. - Tricuspid valve: There was mild-moderate regurgitation.  Impressions:  - No change since June 2019.  ------------------------------------------------------------------- Study data:  Comparison was made to the study of 01/27/2018.  Study status:  Routine.  Procedure:  The patient reported no pain pre or post test. Transthoracic echocardiography. Image quality was adequate.          Transthoracic echocardiography.  M-mode, complete 2D, spectral Doppler, and color Doppler.  Birthdate: Patient birthdate: [birth year].  Age:  Patient is 68 yr old.  Sex: Gender: female.    BMI: 35.8 kg/m^2.  Blood pressure:     156/75 Patient status:  Outpatient.  Study date:  Study date: 08/27/2018. Study time: 11:42 AM.  Location:   [facility]  -------------------------------------------------------------------  ------------------------------------------------------------------- Left ventricle:  The cavity size was normal. There was moderate concentric hypertrophy. Systolic function was normal. The estimated ejection fraction was in the range of 60% to 65%. Wall motion was normal; there were no regional wall motion abnormalities. Doppler parameters are consistent with abnormal left ventricular relaxation (grade 1 diastolic dysfunction). Doppler parameters are consistent with elevated mean left atrial filling pressure.  ------------------------------------------------------------------- Aortic valve:   Probably trileaflet; moderately thickened, moderately calcified leaflets. Valve mobility was restricted. Doppler:   There was moderate to severe stenosis.   There was no significant regurgitation.    VTI ratio of LVOT to aortic  valve: 0.31. Valve area (VTI): 0.88 cm^2. Indexed valve area (VTI): 0.41 cm^2/m^2. Peak velocity ratio of LVOT to aortic valve: 0.33. Valve area (Vmax): 0.94 cm^2. Indexed valve area (Vmax): 0.44 cm^2/m^2. Mean velocity ratio of LVOT to aortic valve: 0.33. Valve area (Vmean): 0.93 cm^2. Indexed valve area (Vmean): 0.44 cm^2/m^2. Mean gradient (S): 37 mm Hg. Peak gradient (S): 66 mm Hg.  ------------------------------------------------------------------- Aorta:  Aortic root: The aortic root was normal in size. Ascending aorta: The ascending aorta was normal in size.  ------------------------------------------------------------------- Mitral valve:   Mildly thickened leaflets .  Doppler: Transvalvular velocity was within the normal range. There was no evidence for stenosis. There was no significant regurgitation. Peak gradient (D): 3 mm Hg.  ------------------------------------------------------------------- Left atrium:  The atrium was mildly dilated.  ------------------------------------------------------------------- Right ventricle:  The cavity size was normal. Systolic function was normal.  ------------------------------------------------------------------- Pulmonic valve:   Poorly visualized.  The valve appears to be grossly normal.    Doppler:  There was mild regurgitation.  ------------------------------------------------------------------- Tricuspid valve:   Structurally normal valve.   Leaflet separation was normal.  Doppler:  Transvalvular velocity was within the normal range. There was mild-moderate regurgitation.  ------------------------------------------------------------------- Pulmonary artery:   Systolic pressure was within the normal range.   ------------------------------------------------------------------- Right atrium:  The atrium was normal in size.  ------------------------------------------------------------------- Pericardium:  There was no  pericardial effusion.  ------------------------------------------------------------------- Measurements  Left ventricle                          Value           Reference  LV ID, ED, PLAX chordal                 44.6   mm       43 - 52  LV ID, ES, PLAX chordal                 27.8   mm       23 - 38  LV fx shortening, PLAX chordal          38     %        >=29  LV PW thickness, ED                     15.7   mm       ----------  IVS/LV PW ratio, ED                     0.84            <=1.3  Stroke volume, 2D                       97     ml       ----------  Stroke volume/bsa, 2D                   45     ml/m^2   ----------  LV e&', lateral                          5.22   cm/s     ----------  LV E/e&', lateral                        16.53           ----------  LV e&', medial                           3.37   cm/s     ----------  LV E/e&', medial                         25.61           ----------  LV e&', average                          4.3    cm/s     ----------  LV E/e&', average                        20.09           ----------  Longitudinal strain, TDI                19     %        ----------    Ventricular septum                      Value           Reference  IVS thickness, ED  13.26  mm       ----------    LVOT                                    Value           Reference  LVOT ID, S                              19     mm       ----------  LVOT area                               2.84   cm^2     ----------  LVOT peak velocity, S                   122.26 cm/s     ----------  LVOT mean velocity, S                   94.7   cm/s     ----------  LVOT VTI, S                             29.84  cm       ----------  LVOT peak gradient, S                   6      mm Hg    ----------  Stroke volume (SV), LVOT DP             84.6   ml       ----------  Stroke index (SV/bsa), LVOT DP          39.6   ml/m^2   ----------    Aortic valve                            Value            Reference  Aortic valve peak velocity, S           405    cm/s     ----------  Aortic valve mean velocity, S           288    cm/s     ----------  Aortic valve VTI, S                     101    cm       ----------  Aortic mean gradient, S                 37     mm Hg    ----------  Aortic peak gradient, S                 66     mm Hg    ----------  VTI ratio, LVOT/AV                      0.31            ----------  Aortic valve area, VTI                  0.88  cm^2     ----------  Aortic valve area/bsa, VTI              0.41   cm^2/m^2 ----------  Velocity ratio, peak, LVOT/AV           0.33            ----------  Aortic valve area, peak velocity        0.94   cm^2     ----------  Aortic valve area/bsa, peak             0.44   cm^2/m^2 ----------  velocity  Velocity ratio, mean, LVOT/AV           0.33            ----------  Aortic valve area, mean velocity        0.93   cm^2     ----------  Aortic valve area/bsa, mean             0.44   cm^2/m^2 ----------  velocity    Aorta                                   Value           Reference  Aortic root ID, ED                      29     mm       ----------    Left atrium                             Value           Reference  LA ID, A-P, ES                          39     mm       ----------  LA ID/bsa, A-P                          1.83   cm/m^2   <=2.2  LA volume, S                            65.1   ml       ----------  LA volume/bsa, S                        30.5   ml/m^2   ----------  LA volume, ES, 1-p A4C                  58.8   ml       ----------  LA volume/bsa, ES, 1-p A4C              27.6   ml/m^2   ----------  LA volume, ES, 1-p A2C                  70     ml       ----------  LA volume/bsa, ES, 1-p A2C              32.8   ml/m^2   ----------    Mitral valve  Value           Reference  Mitral E-wave peak velocity             86.3   cm/s     ----------  Mitral A-wave peak velocity             96      cm/s     ----------  Mitral deceleration time         (H)    511    ms       150 - 230  Mitral peak gradient, D                 3      mm Hg    ----------  Mitral E/A ratio, peak                  0.9             ----------    Pulmonary arteries                      Value           Reference  PA pressure, S, DP                      28     mm Hg    <=30    Tricuspid valve                         Value           Reference  Tricuspid regurg peak velocity          251    cm/s     ----------  Tricuspid peak RV-RA gradient           25     mm Hg    ----------  Tricuspid maximal regurg                251    cm/s     ----------  velocity, PISA    Right atrium                            Value           Reference  RA ID, S-I, ES, A4C                     48.2   mm       34 - 49  RA area, ES, A4C                        13.1   cm^2     8.3 - 19.5  RA volume, ES, A/L                      29.1   ml       ----------  RA volume/bsa, ES, A/L                  13.6   ml/m^2   ----------    Systemic veins                          Value           Reference  Estimated CVP  3      mm Hg    ----------    Right ventricle                         Value           Reference  TAPSE                                   28.7   mm       ----------  RV pressure, S, DP                      28     mm Hg    <=30  RV s&', lateral, S                       11.6   cm/s     ----------  Legend: (L)  and  (H)  mark values outside specified reference range.  ------------------------------------------------------------------- Prepared and Electronically Authenticated by  Sanda Klein, MD 2020-01-23T15:08:39   RIGHT/LEFT HEART CATH AND CORONARY ANGIOGRAPHY  Conclusion     Prox RCA lesion is 70% stenosed.  Ost RPDA lesion is 95% stenosed.  Mid RCA lesion is 40% stenosed.  Post Atrio lesion is 50% stenosed.  Prox LAD lesion is 95% stenosed.  Ost 1st Diag to 1st Diag lesion is 80%  stenosed.  Mid LAD to Dist LAD lesion is 30% stenosed.  2nd Mrg lesion is 99% stenosed.  Mid Cx to Dist Cx lesion is 50% stenosed.   1. Triple vessel CAD 2. Severe aortic stenosis (mean gradient 41 mmHg)  Recommendations: She has triple vessel CAD and severe aortic stenosis. She has mild memory issues but given her disease complexity, will have to consider CABG with surgical AVR. We will arrange her CT scans for planning for TAVR and will discuss at our valve team meeting with our [place].    Indications   Severe aortic stenosis [I35.0 (ICD-10-CM)]  Coronary artery disease of bypass graft of native heart with stable angina pectoris (HCC) [I25.708 (ICD-10-CM)]  Procedural Details   Technical Details Indication: 68 yo female with history of DM, HTN, HLD, memory issues, tobacco abuse and aortic stenosis who is here today for cardiac cath. I saw her as a new patient in February 2019 for aortic valve stenosis. Echo December 2018 with normal LV systolic function. The aortic valve was thickened with restricted leaflet excursion, mean gradient 36 mmHg, peak gradient 59 mmHg, AVA 1.22 cm2, DVI 0.39. She had mild dyspnea with exertion with fatigue and mild dizziness at times. No exertional chest pain. No near syncope, syncope or LE edema. Her aortic stenosis was moderate by echo criteria. Echo 01/27/18 with normal LV systolic function, moderately severe AS with mean gradient of 37 mmHg, peak gradient 64 mmHg, AVA 1.17cm2, DVI 0.37. At her visit here in July 2019 she was feeling fatigued but she had also just been found to be anemic. Colonoscopy September 2019 with non bleeding AVMs and diverticular disease. Non-bleeding esophageal varices were seen on upper endoscopy. Last Hgb 12.9 on 05/05/18. Echo 08/27/18 with LVEF=60-65%. There is moderately severe aortic stenosis with thickened and calcified valve leaflets. Mean gradient 37 mmHg, peak gradient 66 mmHg, Dimensionless index 0.31. AVA 0.88 cm2. She has  been having more dyspnea with exertion. She has fatigue. She has been having  dizziness. This is all progressive over the past 3 months.  Procedure: The risks, benefits, complications, treatment options, and expected outcomes were discussed with the patient. The patient and/or family concurred with the proposed plan, giving informed consent. The patient was brought to the cath lab after IV hydration was given. The patient was further sedated with Versed and Fentanyl. The IV catheter in the right antecubital vein was prepped and draped and changed for a 5 French sheath. Right heart catheterization performed with a balloon tipped catheter. The right wrist was prepped and draped in a sterile fashion. 1% lidocaine was used for local anesthesia. Using the modified Seldinger access technique, a 5 French sheath was placed in the right radial artery. 3 mg Verapamil was given through the sheath. 4500 units IV heparin was given. Standard diagnostic catheters were used to perform selective coronary angiography. I crossed the aortic valve with an AL-1 catheter and straight wire. LV pressures measured. The sheath was removed from the right radial artery and a Terumo hemostasis band was applied at the arteriotomy site on the right wrist.    Estimated blood loss <50 mL.   During this procedure medications were administered to achieve and maintain moderate conscious sedation while the patient's heart rate, blood pressure, and oxygen saturation were continuously monitored and I was present face-to-face 100% of this time.  Medications  (Filter: Administrations occurring from 10/07/18 1020 to 10/07/18 1133)  Medication Rate/Dose/Volume Action  Date Time   fentaNYL (SUBLIMAZE) injection (mcg) 25 mcg Given 10/07/18 1046   Total dose as of 10/19/18 1657        25 mcg        midazolam (VERSED) injection (mg) 1 mg Given 10/07/18 1046   Total dose as of 10/19/18 1657        1 mg        lidocaine (PF) (XYLOCAINE) 1 %  injection (mL) 2 mL Given 10/07/18 1047   Total dose as of 10/19/18 1657 2 mL Given 1048   4 mL        Radial Cocktail/Verapamil only (mL)  Given 10/07/18 1102   Total dose as of 10/19/18 1657        Cannot be calculated        heparin injection (Units) 4,500 Units Given 10/07/18 1057   Total dose as of 10/19/18 1657        4,500 Units        Heparin (Porcine) in NaCl 1000-0.9 UT/500ML-% SOLN (mL) 500 mL Given 10/07/18 1108   Total dose as of 10/19/18 1657 500 mL Given 1108   1,000 mL        iohexol (OMNIPAQUE) 350 MG/ML injection (mL) 120 mL Given 10/07/18 1128   Total dose as of 10/19/18 1657        120 mL        Sedation Time   Sedation Time Physician-1: 37 minutes 31 seconds  Complications   Complications documented before study signed (10/07/2018 11:35 AM EST)    RIGHT/LEFT HEART CATH AND CORONARY ANGIOGRAPHY   None Documented by [name], MD 10/07/2018 11:34 AM EST  Time Range: Intraprocedure      Coronary Findings   Diagnostic  Dominance: Right  Left Anterior Descending  Vessel is large.  Prox LAD lesion 95% stenosed  Prox LAD lesion is 95% stenosed. The lesion is calcified.  Mid LAD to Dist LAD lesion 30% stenosed  Mid LAD to Dist LAD lesion is 30% stenosed.  First Diagonal  Branch  Vessel is moderate in size.  Ost 1st Diag to 1st Diag lesion 80% stenosed  Ost 1st Diag to 1st Diag lesion is 80% stenosed.  Left Circumflex  Vessel is moderate in size.  Mid Cx to Dist Cx lesion 50% stenosed  Mid Cx to Dist Cx lesion is 50% stenosed.  Second Obtuse Marginal Branch  Vessel is small in size.  2nd Mrg lesion 99% stenosed  2nd Mrg lesion is 99% stenosed.  Right Coronary Artery  Vessel is large.  Prox RCA lesion 70% stenosed  Prox RCA lesion is 70% stenosed. The lesion is calcified.  Mid RCA lesion 40% stenosed  Mid RCA lesion is 40% stenosed. The lesion is calcified.  Right Posterior Descending Artery  Vessel is large in size.  Ost RPDA lesion 95%  stenosed  Ost RPDA lesion is 95% stenosed.  Right Posterior Atrioventricular Branch  Post Atrio lesion 50% stenosed  Post Atrio lesion is 50% stenosed.  Intervention   No interventions have been documented.  Coronary Diagrams   Diagnostic  Dominance: Right    Intervention   Implants    No implant documentation for this case.  Syngo Images   Show images for CARDIAC CATHETERIZATION  MERGE Images   Show images for CARDIAC CATHETERIZATION   Link to Procedure Log   Procedure Log    Hemo Data    Most Recent Value  Fick Cardiac Output 7.57 L/min  Fick Cardiac Output Index 3.77 (L/min)/BSA  Aortic Mean Gradient 41.1 mmHg  Aortic Peak Gradient 38 mmHg  Aortic Valve Area 1.44  Aortic Value Area Index 0.71 cm2/BSA  RA A Wave 12 mmHg  RA V Wave 9 mmHg  RA Mean 7 mmHg  RV Systolic Pressure 46 mmHg  RV Diastolic Pressure 2 mmHg  RV EDP 7 mmHg  PA Systolic Pressure 42 mmHg  PA Diastolic Pressure 15 mmHg  PA Mean 25 mmHg  PW A Wave 22 mmHg  PW V Wave 27 mmHg  PW Mean 17 mmHg  AO Systolic Pressure 540 mmHg  AO Diastolic Pressure 61 mmHg  AO Mean 89 mmHg  LV Systolic Pressure 981 mmHg  LV Diastolic Pressure 13 mmHg  LV EDP 19 mmHg  AOp Systolic Pressure 191 mmHg  AOp Diastolic Pressure 61 mmHg  AOp Mean Pressure 93 mmHg  LVp Systolic Pressure 478 mmHg  LVp Diastolic Pressure 18 mmHg  LVp EDP Pressure 30 mmHg  QP/QS 1  TPVR Index 6.64 HRUI  TSVR Index 23.61 HRUI  PVR SVR Ratio 0.1  TPVR/TSVR Ratio 0.28    Cardiac TAVR CT  TECHNIQUE: The patient was scanned on a Siemens Force 295 slice scanner. A 120 kV retrospective scan was triggered in the descending thoracic aorta at 111 HU's. Gantry rotation speed was 270 msecs and collimation was .9 mm. No beta blockade or nitro were given. The 3D data set was reconstructed in 5% intervals of the R-R cycle. Systolic and diastolic phases were analyzed on a dedicated work station using MPR, MIP and VRT modes. The  patient received 161m OMNIPAQUE IOHEXOL 350 MG/ML SOLN of contrast.  FINDINGS: Aortic Valve: Trileaflet aortic valve with moderate aortic valve calcifications and severe leaflet thickening and moderate restriction.  AV calcium score: 826  Aorta: Severe mixed atherosclerosis of the aortic arch, and descending thoracic aorta. There is a focal area of severe, protruding atherosclerosis in the descending thoracic aorta, at the level just inferior to the right inferior pulmonary vein.  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  Sinus of Valsalva Measurements:  Non-coronary: 27 mm  Right - coronary: 24 mm  Left - coronary: 26 mm  Sinus of Valsalva Height:  Right: 16 mm  Left: 16 mm  Coronary Artery Height above Annulus:  Left Main: 12 mm  Right Coronary: 10 mm  Virtual Basal Annulus Measurements:  Maximum/Minimum Diameter: 22.1 x 18.0 mm  Perimeter: 64.2 mm  Area: 317 mm2  Coronary Arteries: 3 vessel coronary artery calcifications. Normal coronary origins. Left main coronary artery has high take off of left sinus.  Optimum Fluoroscopic Angle for Delivery: LAO 6, CAU 6  Moderately dilated main pulmonary artery measures 35 mm.  IMPRESSION: 1. Trileaflet aortic valve with moderate calcifications, severe thickening, and moderate leaflet restriction  2.   AV calcium score 826  3.  Sufficient coronary to annulus distance.  4.  Optimum Fluoroscopic Angle for Delivery: LAO 6, CAU 6  5.  No thrombus in the left atrial appendage.  6.  Annulus area 317 mm2, suitable for a 20 mm Sapien 3 valve.  7. Severe mixed atherosclerosis of the aortic arch, and descending thoracic aorta. There is a focal area of severe, protruding atherosclerosis in the descending thoracic  aorta, at the level just inferior to the right inferior pulmonary vein.  8. High take off of left main coronary artery off of left sinus of Valsalva.  See series 1000 for TAVR measurements.   Electronically Signed   By: Cherlynn Kaiser   On: 10/14/2018 18:56    CT ANGIOGRAPHY CHEST, ABDOMEN AND PELVIS  TECHNIQUE: Multidetector CT imaging through the chest, abdomen and pelvis was performed using the standard protocol during bolus administration of intravenous contrast. Multiplanar reconstructed images and MIPs were obtained and reviewed to evaluate the vascular anatomy.  CONTRAST:  146m OMNIPAQUE IOHEXOL 350 MG/ML SOLN  COMPARISON:  CT the abdomen and pelvis 06/16/2008.  FINDINGS: CTA CHEST FINDINGS  Cardiovascular: Heart size is mildly enlarged. There is no significant pericardial fluid, thickening or pericardial calcification. There is aortic atherosclerosis, as well as atherosclerosis of the great vessels of the mediastinum and the coronary arteries, including calcified atherosclerotic plaque in the left main, left anterior descending, left circumflex and right coronary arteries. Severe thickening calcification of the aortic valve.  Mediastinum/Lymph Nodes: No pathologically enlarged mediastinal or hilar lymph nodes. Esophagus is unremarkable in appearance. No axillary lymphadenopathy.  Lungs/Pleura: 10 x 11 mm left lower lobe nodule (axial image 65 of series 17), new compared to prior study from 06/16/2008. A few patchy nodular areas of ground-glass attenuation are also noted in the lungs, largest of which measures 11 mm in the right upper lobe (axial image 29 of series 17), nonspecific. No acute consolidative airspace disease. No pleural effusions.  Musculoskeletal/Soft Tissues: There are no aggressive appearing lytic or blastic lesions noted in the visualized portions of the skeleton.  CTA ABDOMEN AND PELVIS FINDINGS  Hepatobiliary: Liver  has a very shrunken appearance and nodular contour, indicative of cirrhosis. No discrete cystic or solid hepatic lesions. No intra or extrahepatic biliary ductal dilatation. Gallbladder is normal in appearance.  Pancreas: No pancreatic mass. No pancreatic ductal dilatation. No pancreatic or peripancreatic fluid or inflammatory changes.  Spleen: Unremarkable.  Adrenals/Urinary Tract: Bilateral kidneys and bilateral adrenal glands are normal in appearance. No hydroureteronephrosis. Urinary bladder is normal in appearance.  Stomach/Bowel:  Normal appearance of the stomach. No pathologic dilatation of small bowel or colon. Numerous colonic diverticulae are noted, particularly in the sigmoid colon, without surrounding inflammatory changes to suggest an acute diverticulitis at this time. Normal appendix.  Vascular/Lymphatic: Aortic atherosclerosis, without evidence of aneurysm or dissection in the abdominal or pelvic vasculature. Vascular findings and measurements pertinent to potential TAVR procedure, as detailed below. No lymphadenopathy noted in the abdomen or pelvis.  Reproductive: Uterus and ovaries are unremarkable in appearance.  Other: No significant volume of ascites.  No pneumoperitoneum.  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 There are no aggressive appearing lytic or blastic lesions noted in the visualized portions of the skeleton.  VASCULAR MEASUREMENTS PERTINENT TO TAVR:  AORTA:  Minimal Aortic Diameter-9 x 7 mm  Severity of Aortic Calcification-moderate to severe  RIGHT PELVIS:  Right Common Iliac Artery -  Minimal Diameter-9.6 x 7.0 mm  Tortuosity-mild  Calcification - mild  Right External Iliac Artery -  Minimal Diameter-6.5 x 5.8 mm  Tortuosity - mild  Calcification-none  Right Common Femoral Artery -  Minimal Diameter-6.6 x 5.0 mm  Tortuosity -  mild  Calcification - mild  LEFT PELVIS:  Left Common Iliac Artery -  Minimal Diameter-7.2 x 7.3 mm  Tortuosity - mild  Calcification - mild  Left External Iliac Artery -  Minimal Diameter-7.0 x 5.7 mm  Tortuosity - mild  Calcification-none  Left Common Femoral Artery -  Minimal Diameter-6.4 x 4.5 mm  Tortuosity - mild  Calcification - mild  Review of the MIP images confirms the above findings.  IMPRESSION: 1. Vascular findings and measurements pertinent to potential TAVR procedure, as detailed above. 2. Severe thickening calcification of the aortic valve, compatible with the reported clinical history of severe aortic stenosis. 3. Aortic atherosclerosis, in addition to left main and 3 vessel coronary artery disease. 4. 10 x 11 mm left lower lobe pulmonary nodule, new compared to remote prior study from 2009. Consider one of the following in 3 months for both low-risk and high-risk individuals: (a) repeat chest CT or (b) follow-up PET-CT. This recommendation follows the consensus statement: Guidelines for Management of Incidental Pulmonary Nodules Detected on CT Images: From the Fleischner Society 2017; Radiology 2017; 284:228-243. 5. Several nonspecific slightly nodular appearing areas of ground-glass attenuation most evident throughout the upper lungs. Attention at time of repeat CT or PET-CT examination is recommended. 6. Mild cardiomegaly. 7. Cirrhosis. 8. Colonic diverticulosis without evidence of acute diverticulitis at this time. 9. Additional incidental findings, as above.   Electronically Signed   By: Vinnie Langton M.D.   On: 10/14/2018 13:37    Impression:  Patient has stage D severe symptomatic aortic stenosis and multivessel coronary artery disease.  She presents with progressive symptoms of exertional shortness of breath and fatigue consistent with chronic diastolic congestive heart failure, [place] Heart Association  functional class IIb.  She denies any symptoms of exertional chest pain or chest tightness, although it is conceivable that some of her exertional shortness of breath could be anginal equivalent.    I have personally reviewed the patient's recent transthoracic echocardiogram, diagnostic cardiac catheterization, and CT angiograms.  The patient has severe aortic stenosis.  The aortic valve is trileaflet.  There is moderate to severe thickening, calcification, and restricted leaflet mobility involving all 3 leaflets.  Peak velocity across aortic valve measured 4.0 m/s corresponding to mean transvalvular gradient estimated 37 mmHg.  The mean transvalvular gradient across the  aortic valve was measured 41 mmHg at the time of catheterization.  The functional severity of the patient's aortic stenosis is further exacerbated by the presence of a relatively small size aortic annulus and aortic root.  Catheterization confirmed the presence of severe aortic stenosis and revealed severe multivessel coronary artery disease.  The patient's coronary arteries are diffusely disease as is typically seen in patients with longstanding diabetes.  However, there is a discrete lesion in the proximal left anterior descending coronary artery which is at least 90% stenosed.  There is high-grade stenosis of a small obtuse marginal branch of the left circumflex coronary artery which is probably too small to consider for surgical revascularization.  There is 70% proximal stenosis of the right coronary artery with discrete lesion the proximal posterior descending coronary artery.  Cardiac-gated CTA of the heart reveals anatomical characteristics consistent with aortic stenosis suitable for treatment by transcatheter aortic valve replacement without any significant complicating features other than the fact that the patient's aortic annulus and aortic root are small.  With conventional surgical aortic valve replacement she would require aortic  root enlargement or root replacement in order to avoid the development of patient prosthesis mismatch.  For the same reason, with transcatheter aortic valve replacement she would likely need for her valve to be replaced using a super annular self-expanding valve.  CTA of the aorta and iliac vessels demonstrate what appears to be adequate pelvic vascular access to facilitate a transfemoral approach.  Based upon the patient's coronary anatomy long-term prognosis would probably be better suited with surgical revascularization.  However, risks associated with conventional surgical aortic valve replacement and coronary artery bypass grafting would unquestionably be very high.  She would need aortic root enlargement or root replacement in order to avoid the development of patient prosthesis mismatch.  Even without the need for root enlargement or root replacement the risks associated with conventional surgery would likely be elevated because of the patient's numerous comorbid medical conditions and physical deconditioning.  Of particular concern is the reported presence of significant liver disease with cirrhosis, portal hypertension, history of esophageal varices, and history of dementia.  For these reasons I am very reluctant to consider this patient a candidate for conventional surgery.    Plan:  The patient and her son counseled at length regarding treatment alternatives for management of severe symptomatic aortic stenosis and multivessel coronary artery disease. Alternative approaches such as conventional aortic valve replacement with coronary artery bypass grafting, transcatheter aortic valve replacement with or without PCI and stenting, and continued medical therapy without intervention were compared and contrasted at length.  The risks associated with conventional surgery were discussed in detail, as were expectations for post-operative convalescence, and why I would be reluctant to consider this patient a  candidate for conventional surgery.  Issues specific to transcatheter aortic valve replacement were discussed including questions about long term valve durability, the potential for paravalvular leak, possible increased risk of need for permanent pacemaker placement, and other technical complications related to the procedure itself.  Questions about the feasibility and long-term prognosis following PCI and stenting were discussed.  Long-term prognosis with medical therapy was discussed. This discussion was placed in the context of the patient's own specific clinical presentation and past medical history.  All of their questions have been addressed.  The patient hopes to avoid high risk conventional surgery if a lesser invasive option is feasible.  As a next step the patient will undergo liver ultrasound to evaluate the severity of cirrhosis  and portal hypertension.  She needs dental consultation since she has not seen a dentist in several years.  She will be followed closely in the multidisciplinary heart valve clinic.  All of their questions have been addressed.    I spent in excess of 90 minutes during the conduct of this office consultation and >50% of this time involved direct face-to-face encounter with the patient for counseling and/or coordination of their care.    Valentina Gu. Roxy Manns, MD 10/19/2018 3:52 PM

## 2018-10-19 NOTE — Patient Instructions (Signed)
Continue all previous medications without any changes at this time  

## 2018-10-19 NOTE — Progress Notes (Signed)
Please verify this test is ordered correctly - this abdominal ultrasound is intended to have Doppler examination of portal venous blood flow  Rexene Alberts, MD 10/19/2018 5:34 PM

## 2018-10-20 ENCOUNTER — Other Ambulatory Visit: Payer: Self-pay

## 2018-10-20 DIAGNOSIS — I350 Nonrheumatic aortic (valve) stenosis: Secondary | ICD-10-CM

## 2018-10-26 ENCOUNTER — Ambulatory Visit (INDEPENDENT_AMBULATORY_CARE_PROVIDER_SITE_OTHER): Payer: Medicare HMO | Admitting: Family Medicine

## 2018-10-26 ENCOUNTER — Other Ambulatory Visit: Payer: Self-pay

## 2018-10-26 DIAGNOSIS — R091 Pleurisy: Secondary | ICD-10-CM

## 2018-10-26 DIAGNOSIS — R05 Cough: Secondary | ICD-10-CM | POA: Diagnosis not present

## 2018-10-26 DIAGNOSIS — R059 Cough, unspecified: Secondary | ICD-10-CM

## 2018-10-26 MED ORDER — LEVOFLOXACIN 500 MG PO TABS: 500.0000 mg | ORAL_TABLET | Freq: Every day | ORAL | 0 refills | Status: DC

## 2018-10-26 NOTE — Progress Notes (Signed)
Subjective:    Patient ID: Kelly [name], female    DOB: Sep 13, 1950, 68 y.o.   MRN: [id number]  HPI Patient is a 68 year old Caucasian female who agrees to be seen today via telemedicine.  She is currently at home.  I am in my office.  Patient states that she is concerned she [place] have pneumonia.  She states that her symptoms are similar to what she experienced 2 years ago when she was diagnosed with pneumonia.  She was recently diagnosed with a sinus infection and was given a Z-Pak.  This was done via telephone approximately 10 days ago.  However she is now developed a pressure-like sensation in her back just medial to the scapula on the right-hand side.  She is also coughing.  Cough is nonproductive.  She reports some pleurisy in the back on that right-hand side.  She reports subjective fevers although she does not have a working thermometer.  But she does feel hot and has chills.  She denies any hemoptysis.  She denies any anterior chest pain.  She denies any pressure under her sternum.  She denies any nausea or vomiting or radiation of the pain into her neck or down her left arm.  She has a significant past medical history of severe aortic stenosis.  She also has coronary artery disease however she denies any angina or angina-like chest pain.  She denies any rhinorrhea or sore throat.  She denies any recent travel. Past Medical History:  Diagnosis Date  . Acute encephalopathy 09/14/2016  . Adenomatous polyp   . Anxiety   . Aortic stenosis   . Arrhythmia   . Chronic upper GI bleeding    intestinal AVM's, portal gastropathy, esophageal varices on egd 04/2018  . Coronary artery disease involving native coronary artery without angina pectoris   . Dementia ([place]) 09/16/2016  . DIVERTICULITIS OF COLON 06/14/2008   Qualifier: Diagnosis of  By: [name]    . DM (diabetes mellitus), type 2 ([place]) 09/15/2016  . Esophageal varices determined by endoscopy ([place])   . Fatty liver   . Hyperlipidemia   .  Hypertension   . Incidental pulmonary nodule, greater than or equal to 78m 10/14/2018   LLL nodule needs f/u CT or PET-CT scan  . Irritable bowel syndrome 04/06/2008   Centricity Description: IBS Qualifier: Diagnosis of  By: [name] (ADeborra Medina, [name]   Centricity Description: IRRITABLE BOWEL SYNDROME Qualifier: Diagnosis of  By: [name], [name]  . Liver cirrhosis secondary to NASH ([place] 09/16/2016  . NASH (nonalcoholic steatohepatitis)   . Obesity   . Portal hypertensive gastropathy ([facility]   . SOB (shortness of breath)   . Splenomegaly 09/16/2016  . [place]rombocytopenia ([place]   . Thyroid disease   . Vitamin B 12 deficiency 09/16/2016  . Vitamin D deficiency    Past Surgical History:  Procedure Laterality Date  . COLONOSCOPY WITH PROPOFOL N/A 04/09/2018   Procedure: COLONOSCOPY WITH PROPOFOL;  Surgeon: JMilus Banister MD;  [facility];  Service: Endoscopy;  Laterality: N/A;  . ESOPHAGOGASTRODUODENOSCOPY (EGD) WITH PROPOFOL N/A 04/09/2018   Procedure: ESOPHAGOGASTRODUODENOSCOPY (EGD) WITH PROPOFOL;  Surgeon: JMilus Banister MD;  [facility];  Service: Endoscopy;  Laterality: N/A;  . oopherectomy    . OVARIAN CYST REMOVAL    . POLYPECTOMY  04/09/2018   Procedure: POLYPECTOMY;  Surgeon: JMilus Banister MD;  [facility];  Service: Endoscopy;;  . RIGHT/LEFT HEART CATH AND CORONARY ANGIOGRAPHY N/A 10/07/2018  Procedure: RIGHT/LEFT HEART CATH AND CORONARY ANGIOGRAPHY;  Surgeon: [name], MD;  Location: [facility];  Service: Cardiovascular;  Laterality: N/A;   Current Outpatient Medications on File Prior to Visit  Medication Sig Dispense Refill  . ACCU-CHEK AVIVA PLUS test strip     . ACCU-CHEK SOFTCLIX LANCETS lancets     . amLODipine (NORVASC) 10 MG tablet Take 1 tablet (10 mg total) by mouth daily. 90 tablet 3  . atorvastatin (LIPITOR) 20 MG tablet TAKE 1 TABLET (20 MG TOTAL) BY MOUTH DAILY. 90 tablet 3  . azithromycin (ZITHROMAX) 250 MG  tablet 2 tabs poqday1, 1 tab poqday 2-5 6 tablet 0  . Blood Glucose Monitoring Suppl (ACCU-CHEK AVIVA PLUS) w/Device KIT     . ferrous sulfate 325 (65 FE) MG tablet Take 1 tablet (325 mg total) by mouth daily. 90 tablet 3  . furosemide (LASIX) 20 MG tablet Take 1 tablet (20 mg total) by mouth daily. 90 tablet 3  . levothyroxine (SYNTHROID, LEVOTHROID) 75 MCG tablet TAKE 1 TABLET EVERY DAY (Patient taking differently: Take 75 mcg by mouth daily. ) 90 tablet 3  . losartan (COZAAR) 100 MG tablet Take 1 tablet (100 mg total) by mouth daily. Stop lisinopril hctz (Patient taking differently: Take 100 mg by mouth daily. ) 90 tablet 3  . metFORMIN (GLUCOPHAGE-XR) 500 MG 24 hr tablet TAKE 2 TABLETS TWICE DAILY 360 tablet 1  . methylcellulose (CITRUCEL) oral powder Take 1 packet by mouth daily as needed (constipation).    . naproxen sodium (ALEVE) 220 MG tablet Take 220-440 mg by mouth daily as needed (pain).    . pantoprazole (PROTONIX) 40 MG tablet Take 40 mg by mouth daily.    . potassium chloride SA (K-DUR,KLOR-CON) 20 MEQ tablet Take 1 tablet (20 mEq total) by mouth daily. 90 tablet 3  . propranolol (INDERAL) 40 MG tablet TAKE 1 TABLET TWICE DAILY (Patient taking differently: Take 40 mg by mouth 2 (two) times daily. ) 180 tablet 0  . traZODone (DESYREL) 50 MG tablet TAKE 1 TABLET AT BEDTIME AS NEEDED FOR SLEEP (Patient taking differently: Take 50 mg by mouth at bedtime as needed for sleep. ) 90 tablet 1  . zolpidem (AMBIEN) 5 MG tablet TAKE 1 TABLET BY MOUTH AT BEDTIME AS NEEDED FOR SLEEP (Patient taking differently: Take 5 mg by mouth at bedtime as needed for sleep. ) 30 tablet 2   Current Facility-Administered Medications on File Prior to Visit  Medication Dose Route Frequency Provider Last Rate Last Dose  . cyanocobalamin ((VITAMIN B-12)) injection 1,000 mcg  1,000 mcg Subcutaneous Q30 days [name], MD   1,000 mcg at 09/08/17 1408   Allergies  Allergen Reactions  . Celecoxib     Unknown  reaction  . Prednisone Itching    Heart rate increased  . Macrodantin [Nitrofurantoin Macrocrystal] Rash  . Penicillins Hives    Did it involve swelling of the face/tongue/throat, SOB, or low BP? Unknown Did it involve sudden or severe rash/hives, skin peeling, or any reaction on the inside of your mouth or nose? Unknown Did you need to seek medical attention at a hospital or doctor's office? Unknown When did it last happen?20 + years If all above answers are "NO", [place] proceed with cephalosporin use.    Social History   Socioeconomic History  . Marital status: Married    Spouse name: Not on file  . Number of children: Not on file  . Years of education: Not on  file  . Highest education level: Not on file  Occupational History  . Not on file  Social Needs  . Financial resource strain: Not on file  . Food insecurity:    Worry: Not on file    Inability: Not on file  . Transportation needs:    Medical: Not on file    Non-medical: Not on file  Tobacco Use  . Smoking status: Current Every Day Smoker    Packs/day: 0.50    Types: Cigarettes  . Smokeless tobacco: Never Used  Substance and Sexual Activity  . Alcohol use: No  . Drug use: No  . Sexual activity: Not Currently  Lifestyle  . Physical activity:    Days per week: Not on file    Minutes per session: Not on file  . Stress: Not on file  Relationships  . Social connections:    Talks on phone: Not on file    Gets together: Not on file    Attends religious service: Not on file    Active member of club or organization: Not on file    Attends meetings of clubs or organizations: Not on file    Relationship status: Not on file  . Intimate partner violence:    Fear of current or ex partner: Not on file    Emotionally abused: Not on file    Physically abused: Not on file    Forced sexual activity: Not on file  Other Topics Concern  . Not on file  Social History Narrative  . Not on file       Review of  Systems  All other systems reviewed and are negative.      Objective:   Physical Exam I am unable to perform a physical exam today as this visit is being conducted over the telephone.       Assessment & Plan:  Cough - Plan: DG Chest 2 View, levofloxacin (LEVAQUIN) 500 MG tablet  Pleurisy - Plan: DG Chest 2 View, levofloxacin (LEVAQUIN) 500 MG tablet  Patient symptoms are not concerning for coronavirus.  She has no recent travel.  Symptoms also do not sound cardiac in nature.  Given the atypical nature of her symptoms, I have asked the patient to get a chest x-ray given the unusual nature of her symptoms.  Meanwhile, I will prescribe Levaquin 500 mg daily for 7 days.  However I want to see the patient in the office for a follow-up in 24 to 48 hours if no better or immediately if worsening.  Again at 1157.  Office visit concluded at 1213

## 2018-10-27 ENCOUNTER — Ambulatory Visit: Admission: RE | Admit: 2018-10-27 | Payer: Medicare HMO | Source: Ambulatory Visit

## 2018-10-27 ENCOUNTER — Other Ambulatory Visit: Payer: Self-pay | Admitting: Family Medicine

## 2018-10-27 ENCOUNTER — Ambulatory Visit
Admission: RE | Admit: 2018-10-27 | Discharge: 2018-10-27 | Disposition: A | Discharge status: Home / Self Care | Payer: Medicare HMO | Source: Ambulatory Visit | Attending: Family Medicine | Admitting: Family Medicine

## 2018-10-27 DIAGNOSIS — R059 Cough, unspecified: Secondary | ICD-10-CM

## 2018-10-27 DIAGNOSIS — R05 Cough: Secondary | ICD-10-CM

## 2018-10-27 DIAGNOSIS — R091 Pleurisy: Secondary | ICD-10-CM

## 2018-10-28 ENCOUNTER — Other Ambulatory Visit: Payer: Self-pay

## 2018-10-28 ENCOUNTER — Telehealth: Payer: Self-pay | Admitting: Family Medicine

## 2018-10-28 ENCOUNTER — Encounter: Payer: Self-pay | Admitting: Family Medicine

## 2018-10-28 ENCOUNTER — Ambulatory Visit (INDEPENDENT_AMBULATORY_CARE_PROVIDER_SITE_OTHER): Payer: Medicare HMO | Admitting: Family Medicine

## 2018-10-28 VITALS — BP 140/88 | HR 48 | Temp 98.2°F | Resp 14 | Ht 64.5 in | Wt 214.0 lb

## 2018-10-28 DIAGNOSIS — K922 Gastrointestinal hemorrhage, unspecified: Secondary | ICD-10-CM

## 2018-10-28 DIAGNOSIS — I2510 Atherosclerotic heart disease of native coronary artery without angina pectoris: Secondary | ICD-10-CM

## 2018-10-28 DIAGNOSIS — I350 Nonrheumatic aortic (valve) stenosis: Secondary | ICD-10-CM

## 2018-10-28 DIAGNOSIS — D508 Other iron deficiency anemias: Secondary | ICD-10-CM

## 2018-10-28 DIAGNOSIS — F419 Anxiety disorder, unspecified: Secondary | ICD-10-CM

## 2018-10-28 LAB — CBC WITH DIFFERENTIAL/PLATELET
Absolute Monocytes: 639 cells/uL (ref 200–950)
Basophils Absolute: 62 cells/uL (ref 0–200)
Basophils Relative: 0.8 %
Eosinophils Absolute: 208 cells/uL (ref 15–500)
Eosinophils Relative: 2.7 %
HCT: 34.6 % — ABNORMAL LOW (ref 35.0–45.0)
Hemoglobin: 11.9 g/dL (ref 11.7–15.5)
Lymphs Abs: 2187 cells/uL (ref 850–3900)
MCH: 30.4 pg (ref 27.0–33.0)
MCHC: 34.4 g/dL (ref 32.0–36.0)
MCV: 88.5 fL (ref 80.0–100.0)
MPV: 11.7 fL (ref 7.5–12.5)
Monocytes Relative: 8.3 %
Neutro Abs: 4605 cells/uL (ref 1500–7800)
Neutrophils Relative %: 59.8 %
Platelets: 117 10*3/uL — ABNORMAL LOW (ref 140–400)
RBC: 3.91 10*6/uL (ref 3.80–5.10)
RDW: 13.1 % (ref 11.0–15.0)
TOTAL LYMPHOCYTE: 28.4 %
WBC: 7.7 10*3/uL (ref 3.8–10.8)

## 2018-10-28 LAB — BASIC METABOLIC PANEL WITH GFR
BUN: 14 mg/dL (ref 7–25)
CO2: 27 mmol/L (ref 20–32)
Calcium: 9.3 mg/dL (ref 8.6–10.4)
Chloride: 108 mmol/L (ref 98–110)
Creat: 0.81 mg/dL (ref 0.60–0.93)
GFR, Est African American: 85 mL/min/{1.73_m2} (ref >=60)
GFR, Est Non African American: 74 mL/min/{1.73_m2} (ref >=60)
Glucose, Bld: 165 mg/dL — ABNORMAL HIGH (ref 65–99)
Potassium: 4.5 mmol/L (ref 3.5–5.3)
Sodium: 141 mmol/L (ref 135–146)

## 2018-10-28 MED ORDER — LORAZEPAM 0.5 MG PO TABS: 0.2500 mg | ORAL_TABLET | Freq: Three times a day (TID) | ORAL | 0 refills | Status: DC

## 2018-10-28 NOTE — Telephone Encounter (Signed)
Error

## 2018-10-28 NOTE — Progress Notes (Signed)
Subjective:    Patient ID: Kelly [name], female    DOB: [birth year], 68 y.o.   MRN: [id number]  HPI  10/26/18 Patient is a 68 year old Caucasian female who agrees to be seen today via telemedicine.  She is currently at home.  I am in my office.  Patient states that she is concerned she [place] have pneumonia.  She states that her symptoms are similar to what she experienced 2 years ago when she was diagnosed with pneumonia.  She was recently diagnosed with a sinus infection and was given a Z-Pak.  This was done via telephone approximately 10 days ago.  However she is now developed a pressure-like sensation in her back just medial to the scapula on the right-hand side.  She is also coughing.  Cough is nonproductive.  She reports some pleurisy in the back on that right-hand side.  She reports subjective fevers although she does not have a working thermometer.  But she does feel hot and has chills.  She denies any hemoptysis.  She denies any anterior chest pain.  She denies any pressure under her sternum.  She denies any nausea or vomiting or radiation of the pain into her neck or down her left arm.  She has a significant past medical history of severe aortic stenosis.  She also has coronary artery disease however she denies any angina or angina-like chest pain.  She denies any rhinorrhea or sore throat.  She denies any recent travel.  At that time, my plan was: Patient symptoms are not concerning for coronavirus.  She has no recent travel.  Symptoms also do not sound cardiac in nature.  Given the atypical nature of her symptoms, I have asked the patient to get a chest x-ray given the unusual nature of her symptoms.  Meanwhile, I will prescribe Levaquin 500 mg daily for 7 days.  However I want to see the patient in the office for a follow-up in 24 to 48 hours if no better or immediately if worsening.  Again at 1157.  Office visit concluded at 1213  10/28/18 CXR: Stable cardiomegaly. No pneumothorax or pleural  effusion is noted. Both lungs are clear. The visualized skeletal structures are Unremarkable.  Recently underwent cardiac catheterization planning for her aortic valve replacement and was found to have significant three-vessel disease including a 95% lesion in the LAD.  Has met with cardiothoracic surgery and has been deemed a poor surgical candidate due to her other medical comorbidities.  I reviewed Dr. Ricard Dillon consultation.  He is recommended possibly having PCI and medical treatment of her coronary artery lesions.  After the patient's recent catheterization, her hemoglobin had dropped to as low as 8.2 in review of her medical records.  Patient was instructed to discontinue antibiotics.  She was advised to come into the office given the fact she was still experiencing pain.  Now she states that she is having no cough.  She is having no pleurisy.  The pain is located over top of her right scapula and just medial to her right scapula.  I am able to reproduce the pain with palpation of the thoracic paraspinal muscles and the muscles overlying the scapula.  She is also complaining of some pain and aching in her right shoulder and right tricep area.  I suspect that this pain is musculoskeletal.  Another possibility would be cervical radiculopathy.  However my original concern was possible atypical cardiac symptoms given her recent catheterization report.  My fears are alleviated today after  examining the patient.  This is almost certainly musculoskeletal.  She does complain of fatigue.  She complains of shortness of breath with activity.  However these are stable.  She does occasionally report chest discomfort.  She describes it as a pressure-like sensation located at the lower portion of her sternum.  This only occurs with activity and improves with rest.  It sounds consistent with stable angina.  She is not currently taking any nitroglycerin.  Past Medical History:  Diagnosis Date   Acute encephalopathy  09/14/2016   Adenomatous polyp    Anxiety    Aortic stenosis    Arrhythmia    Chronic upper GI bleeding    intestinal AVM's, portal gastropathy, esophageal varices on egd 04/2018   Coronary artery disease involving native coronary artery without angina pectoris    Dementia ([place]) 09/16/2016   DIVERTICULITIS OF COLON 06/14/2008   Qualifier: Diagnosis of  By: Gretta Cool RN, Malachy Mood     DM (diabetes mellitus), type 2 ([place]) 09/15/2016   Esophageal varices determined by endoscopy ([place])    Fatty liver    Hyperlipidemia    Hypertension    Incidental pulmonary nodule, greater than or equal to 61m 10/14/2018   LLL nodule needs f/u CT or PET-CT scan   Irritable bowel syndrome 04/06/2008   Centricity Description: IBS Qualifier: Diagnosis of  By: [name] (ADeborra Medina, [name]   Centricity Description: IRRITABLE BOWEL SYNDROME Qualifier: Diagnosis of  By: [name], [name]   Liver cirrhosis secondary to NASH ([facility] 09/16/2016   NASH (nonalcoholic steatohepatitis)    Obesity    Portal hypertensive gastropathy ([facility] [place]    SOB (shortness of breath)    Splenomegaly 09/16/2016   Thrombocytopenia ([facility]    Thyroid disease    Vitamin B 12 deficiency 09/16/2016   Vitamin D deficiency    Past Surgical History:  Procedure Laterality Date   COLONOSCOPY WITH PROPOFOL N/A 04/09/2018   Procedure: COLONOSCOPY WITH PROPOFOL;  Surgeon: JMilus Banister MD;  [facility];  Service: Endoscopy;  Laterality: N/A;   ESOPHAGOGASTRODUODENOSCOPY (EGD) WITH PROPOFOL N/A 04/09/2018   Procedure: ESOPHAGOGASTRODUODENOSCOPY (EGD) WITH PROPOFOL;  Surgeon: JMilus Banister MD;  [facility];  Service: Endoscopy;  Laterality: N/A;   oopherectomy     OVARIAN CYST REMOVAL     POLYPECTOMY  04/09/2018   Procedure: POLYPECTOMY;  Surgeon: JMilus Banister MD;  [facility];  Service: Endoscopy;;   RIGHT/LEFT HEART CATH AND CORONARY ANGIOGRAPHY N/A 10/07/2018   Procedure: RIGHT/LEFT HEART CATH AND  CORONARY ANGIOGRAPHY;  Surgeon: MBurnell Blanks MD;  [facility];  Service: Cardiovascular;  Laterality: N/A;   Current Outpatient Medications on File Prior to Visit  Medication Sig Dispense Refill   ACCU-CHEK AVIVA PLUS test strip      ACCU-CHEK SOFTCLIX LANCETS lancets      amLODipine (NORVASC) 10 MG tablet Take 1 tablet (10 mg total) by mouth daily. 90 tablet 3   atorvastatin (LIPITOR) 20 MG tablet TAKE 1 TABLET (20 MG TOTAL) BY MOUTH DAILY. 90 tablet 3   azithromycin (ZITHROMAX) 250 MG tablet 2 tabs poqday1, 1 tab poqday 2-5 6 tablet 0   Blood Glucose Monitoring Suppl (ACCU-CHEK AVIVA PLUS) w/Device KIT      ferrous sulfate 325 (65 FE) MG tablet Take 1 tablet (325 mg total) by mouth daily. 90 tablet 3   furosemide (LASIX) 20 MG tablet Take 1 tablet (20 mg total) by mouth daily. 90 tablet 3   levofloxacin (LEVAQUIN)  500 MG tablet Take 1 tablet (500 mg total) by mouth daily. 7 tablet 0   levothyroxine (SYNTHROID, LEVOTHROID) 75 MCG tablet TAKE 1 TABLET EVERY DAY (Patient taking differently: Take 75 mcg by mouth daily. ) 90 tablet 3   losartan (COZAAR) 100 MG tablet Take 1 tablet (100 mg total) by mouth daily. Stop lisinopril hctz (Patient taking differently: Take 100 mg by mouth daily. ) 90 tablet 3   metFORMIN (GLUCOPHAGE-XR) 500 MG 24 hr tablet TAKE 2 TABLETS TWICE DAILY 360 tablet 1   methylcellulose (CITRUCEL) oral powder Take 1 packet by mouth daily as needed (constipation).     naproxen sodium (ALEVE) 220 MG tablet Take 220-440 mg by mouth daily as needed (pain).     pantoprazole (PROTONIX) 40 MG tablet Take 40 mg by mouth daily.     potassium chloride SA (K-DUR,KLOR-CON) 20 MEQ tablet Take 1 tablet (20 mEq total) by mouth daily. 90 tablet 3   propranolol (INDERAL) 40 MG tablet TAKE 1 TABLET TWICE DAILY (Patient taking differently: Take 40 mg by mouth 2 (two) times daily. ) 180 tablet 0   traZODone (DESYREL) 50 MG tablet TAKE 1 TABLET AT  BEDTIME AS NEEDED FOR SLEEP (Patient taking differently: Take 50 mg by mouth at bedtime as needed for sleep. ) 90 tablet 1   zolpidem (AMBIEN) 5 MG tablet TAKE 1 TABLET BY MOUTH AT BEDTIME AS NEEDED FOR SLEEP (Patient taking differently: Take 5 mg by mouth at bedtime as needed for sleep. ) 30 tablet 2   Current Facility-Administered Medications on File Prior to Visit  Medication Dose Route Frequency Provider Last Rate Last Dose   cyanocobalamin ((VITAMIN B-12)) injection 1,000 mcg  1,000 mcg Subcutaneous Q30 days Jenna Luo T, MD   1,000 mcg at 09/08/17 1408   Allergies  Allergen Reactions   Celecoxib     Unknown reaction   Prednisone Itching    Heart rate increased   Macrodantin [Nitrofurantoin Macrocrystal] Rash   Penicillins Hives    Did it involve swelling of the face/tongue/throat, SOB, or low BP? Unknown Did it involve sudden or severe rash/hives, skin peeling, or any reaction on the inside of your mouth or nose? Unknown Did you need to seek medical attention at a hospital or doctor's office? Unknown When did it last happen?20 + years If all above answers are NO, [place] proceed with cephalosporin use.    Social History   Socioeconomic History   Marital status: Married    Spouse name: Not on file   Number of children: Not on file   Years of education: Not on file   Highest education level: Not on file  Occupational History   Not on file  Social Needs   Financial resource strain: Not on file   Food insecurity:    Worry: Not on file    Inability: Not on file   Transportation needs:    Medical: Not on file    Non-medical: Not on file  Tobacco Use   Smoking status: Current Every Day Smoker    Packs/day: 0.50    Types: Cigarettes   Smokeless tobacco: Never Used  Substance and Sexual Activity   Alcohol use: No   Drug use: No   Sexual activity: Not Currently  Lifestyle   Physical activity:    Days per week: Not on file    Minutes per  session: Not on file   Stress: Not on file  Relationships   Social connections:    Talks on  phone: Not on file    Gets together: Not on file    Attends religious service: Not on file    Active member of club or organization: Not on file    Attends meetings of clubs or organizations: Not on file    Relationship status: Not on file   Intimate partner violence:    Fear of current or ex partner: Not on file    Emotionally abused: Not on file    Physically abused: Not on file    Forced sexual activity: Not on file  Other Topics Concern   Not on file  Social History Narrative   Not on file       Review of Systems  All other systems reviewed and are negative.      Objective:   Physical Exam Vitals signs reviewed.  Constitutional:      Appearance: Normal appearance.  Cardiovascular:     Rate and Rhythm: Normal rate.     Heart sounds: Murmur present. No friction rub. No gallop.   Pulmonary:     Effort: Pulmonary effort is normal. No respiratory distress.     Breath sounds: Normal breath sounds. No stridor. No wheezing, rhonchi or rales.  Chest:     Chest wall: No tenderness.  Abdominal:     General: Abdomen is flat. Bowel sounds are normal. There is no distension.     Palpations: Abdomen is soft. There is no mass.     Tenderness: There is no abdominal tenderness. There is no guarding.     Hernia: No hernia is present.  Musculoskeletal:     Thoracic back: She exhibits tenderness and pain. She exhibits no bony tenderness, no swelling, no edema and no deformity.       Back:  Neurological:     Mental Status: She is alert.           Assessment & Plan:  Other iron deficiency anemia - Plan: CBC with Differential/Platelet, BASIC METABOLIC PANEL WITH GFR  Aortic valve stenosis, etiology of cardiac valve disease unspecified  Coronary artery disease involving native coronary artery of native heart without angina pectoris  Chronic upper GI bleeding  Several issues  are identified today.  First, the pain that she called and reported does not appear to be pulmonary in nature nor cardiac.  I feel convinced that this is musculoskeletal in nature and likely is simply a muscle strain.  I have recommended warm compresses and topical rub such as BenGay or Aspercreme to alleviate the pain.  I recommended no use of Tylenol given her liver history and also recommended against use of Aleve.  This is my second concern.  The patient has been using Aleve.  Given her history of chronic GI bleed, esophageal varices, and anemia as well as her coronary artery disease, I recommended strongly against any use of any NSAID.  Patient will discontinue this immediately.  Third issue is the drop in her hemoglobin after her recent catheterization.  This could be exacerbating her fatigue, her dyspnea on exertion, and even contributing to her occasional typical angina if her hemoglobin is getting below 8.  I will repeat a CBC and if her hemoglobin is falling further, she Ruder require an iron transfusion.  This was supposed to be done now however it has been delayed until [place] due to the recent coronavirus outbreak.  If her hemoglobin is near 8 or less, we will need to expedite that.  Fourth issue is her occasional angina and shortness  of breath.  Patient Kring benefit from a long-acting nitroglycerin such as Imdur if this continues and if her hemoglobin is normal.  We will defer that today but if her hemoglobin is normal and if she continues to experience occasional angina, we [place] consider adding that until definitive therapy is performed.  Last issue is anxiety.  Patient is very scared by what is going on.  She is very worried.  She is stopped taking trazodone.  She is stopped taking Ambien.  She would like something that she can use sparingly when she feels panicked or anxious or to help her sleep.  She does have mild dementia.  We discussed the risk of taking a low-dose benzodiazepine as far as exacerbating  confusion however we will try 0.25 mg Ativan every 8 hours as needed.  I will give her 10 tablets and encouraged her to use it sparingly and even have her husband monitor her use to avoid confusion and delirium.

## 2018-10-30 ENCOUNTER — Telehealth: Payer: Self-pay | Admitting: *Deleted

## 2018-10-30 NOTE — Telephone Encounter (Signed)
Received call from patient.   States that she has been taking Ativan as needed. States that she wanted to notify MD that medication is effective and is working well.

## 2018-11-04 ENCOUNTER — Other Ambulatory Visit: Payer: Medicare HMO

## 2018-11-04 ENCOUNTER — Ambulatory Visit: Payer: Medicare HMO | Admitting: Oncology

## 2018-11-04 ENCOUNTER — Other Ambulatory Visit: Payer: Self-pay

## 2018-11-06 ENCOUNTER — Other Ambulatory Visit: Payer: Self-pay | Admitting: Gastroenterology

## 2018-11-11 ENCOUNTER — Other Ambulatory Visit: Payer: Self-pay | Admitting: Family Medicine

## 2018-11-12 ENCOUNTER — Telehealth: Payer: Self-pay | Admitting: Family Medicine

## 2018-11-12 NOTE — Telephone Encounter (Signed)
Requested Prescriptions   Pending Prescriptions Disp Refills  . LORazepam (ATIVAN) 0.5 MG tablet [Pharmacy Med Name: LORAZEPAM 0.5 MG TABLET] 10 tablet 0    Sig: TAKE 1/2 TABLET BY MOUTH EVERY 8 HOURS AS NEEDED   Last OV 10/28/2018  Last Rx written 10/28/2018

## 2018-11-12 NOTE — Telephone Encounter (Signed)
Patient is asking for refill on propranolol  [facility]

## 2018-11-12 NOTE — Telephone Encounter (Signed)
Tried to call no answer. Propanolol was filled by gastro Dr. [name] on 11/09/2018

## 2018-11-16 NOTE — Telephone Encounter (Signed)
This was refilled -  propranolol (INDERAL) 40 MG tablet 180 tablet 0 11/09/2018    Sig - Route: Take 1 tablet (40 mg total) by mouth 2 (two) times daily. - Oral   Sent to pharmacy as: propranolol (INDERAL) 40 MG tablet   E-Prescribing Status: Receipt confirmed by pharmacy (11/09/2018 9:28 AM EDT)   Pharmacy   [facility] [place], [place]

## 2018-11-25 ENCOUNTER — Telehealth: Payer: Self-pay | Admitting: Oncology

## 2018-11-25 NOTE — Telephone Encounter (Signed)
Cancelled lab and changed 5/13 appt to phone visit per sch msg. Called patient. No answer. No option to leave msg. Will try and contact patient again.

## 2018-12-01 ENCOUNTER — Telehealth: Payer: Self-pay | Admitting: Oncology

## 2018-12-01 NOTE — Telephone Encounter (Signed)
Called patient regarding upcoming appointment, informed patient that the lab appointment has been cancelled and the follow up visit will be over the phone.

## 2018-12-10 ENCOUNTER — Encounter: Payer: Self-pay | Admitting: Gastroenterology

## 2018-12-11 ENCOUNTER — Other Ambulatory Visit (HOSPITAL_COMMUNITY): Payer: Medicare HMO | Admitting: Dentistry

## 2018-12-11 ENCOUNTER — Telehealth (HOSPITAL_COMMUNITY): Payer: Self-pay

## 2018-12-11 NOTE — Telephone Encounter (Signed)
Patient left message on Dental Medicine machine on 12-10-18 stating her husband had eye surgery and would be unable to keep appointment for 12-11-18 at 8:30am. Molli Posey

## 2018-12-16 ENCOUNTER — Inpatient Hospital Stay: Payer: Medicare HMO | Attending: Oncology | Admitting: Oncology

## 2018-12-16 ENCOUNTER — Other Ambulatory Visit: Payer: Medicare HMO

## 2018-12-16 DIAGNOSIS — D509 Iron deficiency anemia, unspecified: Secondary | ICD-10-CM

## 2018-12-16 DIAGNOSIS — D696 Thrombocytopenia, unspecified: Secondary | ICD-10-CM | POA: Diagnosis not present

## 2018-12-16 NOTE — Progress Notes (Signed)
Hematology and Oncology Follow Up for Telemedicine Visits  Kelly [name] [id number] 01/12/1951 68 y.o. 12/16/2018 9:51 AM Kelly [name], [name], MDPickard, [name], MD   I connected with Kelly [name] on 12/16/18 at 10:30 AM EDT by telephone visit and verified that I am speaking with the correct person using two identifiers.   I discussed the limitations, risks, security and privacy concerns of performing an evaluation and management service by telemedicine and the availability of in-person appointments. I also discussed with the patient that there Trigg be a patient responsible charge related to this service. The patient expressed understanding and agreed to proceed.  Other persons participating in the visit and their role in the encounter: None  Patient's location: Home Provider's location: Office    Principle Diagnosis: 68 year old woman with iron deficiency anemia diagnosed in July 2019.  Iron deficiency related to poor iron absorption and chronic blood loss.  GI work-up has been unrevealing.   Prior Therapy: IV iron replacement completed August 2019 utilizing 1000 mg of Feraheme.  Current therapy: Active surveillance.  Interim History: Kelly [name] reports no major complaints at this time.  She is currently under evaluation for possible aortic valve repair/replacement in the setting of multivessel coronary artery disease.  She had laboratory testing on October 28, 2018 which showed a hemoglobin of 11.9 platelet count of 117.  She is currently asymptomatic overall without any chest pain or difficulty breathing.  She denies any bleeding complications or easy bruising.  Performance status quality of life remains unchanged.   Medications: I have reviewed the patient's current medications.  Current Outpatient Medications  Medication Sig Dispense Refill  . ACCU-CHEK AVIVA PLUS test strip     . ACCU-CHEK SOFTCLIX LANCETS lancets     . amLODipine (NORVASC) 10 MG tablet Take 1 tablet (10 mg total) by mouth  daily. 90 tablet 3  . atorvastatin (LIPITOR) 20 MG tablet TAKE 1 TABLET (20 MG TOTAL) BY MOUTH DAILY. 90 tablet 3  . azithromycin (ZITHROMAX) 250 MG tablet 2 tabs poqday1, 1 tab poqday 2-5 6 tablet 0  . Blood Glucose Monitoring Suppl (ACCU-CHEK AVIVA PLUS) w/Device KIT     . ferrous sulfate 325 (65 FE) MG tablet Take 1 tablet (325 mg total) by mouth daily. 90 tablet 3  . furosemide (LASIX) 20 MG tablet Take 1 tablet (20 mg total) by mouth daily. 90 tablet 3  . levofloxacin (LEVAQUIN) 500 MG tablet Take 1 tablet (500 mg total) by mouth daily. 7 tablet 0  . levothyroxine (SYNTHROID, LEVOTHROID) 75 MCG tablet TAKE 1 TABLET EVERY DAY (Patient taking differently: Take 75 mcg by mouth daily. ) 90 tablet 3  . LORazepam (ATIVAN) 0.5 MG tablet TAKE 1/2 TABLET BY MOUTH EVERY 8 HOURS AS NEEDED 10 tablet 0  . losartan (COZAAR) 100 MG tablet Take 1 tablet (100 mg total) by mouth daily. Stop lisinopril hctz (Patient taking differently: Take 100 mg by mouth daily. ) 90 tablet 3  . metFORMIN (GLUCOPHAGE-XR) 500 MG 24 hr tablet TAKE 2 TABLETS TWICE DAILY 360 tablet 1  . methylcellulose (CITRUCEL) oral powder Take 1 packet by mouth daily as needed (constipation).    . pantoprazole (PROTONIX) 40 MG tablet Take 40 mg by mouth daily.    . potassium chloride SA (K-DUR,KLOR-CON) 20 MEQ tablet Take 1 tablet (20 mEq total) by mouth daily. 90 tablet 3  . propranolol (INDERAL) 40 MG tablet Take 1 tablet (40 mg total) by mouth 2 (two) times daily. 180 tablet 0   Current  Facility-Administered Medications  Medication Dose Route Frequency Provider Last Rate Last Dose  . cyanocobalamin ((VITAMIN B-12)) injection 1,000 mcg  1,000 mcg Subcutaneous Q30 days [name], MD   1,000 mcg at 09/08/17 1408     Allergies:  Allergies  Allergen Reactions  . Celecoxib     Unknown reaction  . Prednisone Itching    Heart rate increased  . Macrodantin [Nitrofurantoin Macrocrystal] Rash  . Penicillins Hives    Did it involve  swelling of the face/tongue/throat, SOB, or low BP? Unknown Did it involve sudden or severe rash/hives, skin peeling, or any reaction on the inside of your mouth or nose? Unknown Did you need to seek medical attention at a hospital or doctor's office? Unknown When did it last happen?20 + years If all above answers are "NO", Aydt proceed with cephalosporin use.     Past Medical History, Surgical history, Social history, and Family History were reviewed and updated.      Lab Results: Lab Results  Component Value Date   WBC 7.7 10/28/2018   HGB 11.9 10/28/2018   HCT 34.6 (L) 10/28/2018   MCV 88.5 10/28/2018   PLT 117 (L) 10/28/2018     Chemistry      Component Value Date/Time   NA 141 10/28/2018 1115   K 4.5 10/28/2018 1115   CL 108 10/28/2018 1115   CO2 27 10/28/2018 1115   BUN 14 10/28/2018 1115   CREATININE 0.81 10/28/2018 1115      Component Value Date/Time   CALCIUM 9.3 10/28/2018 1115   ALKPHOS 96 07/13/2018 1016   AST 16 09/21/2018 1203   ALT 10 09/21/2018 1203   BILITOT 0.8 09/21/2018 1203       Impression and Plan:   68 year old woman with the following:  1.  Iron deficiency anemia related to poor iron absorption possible chronic blood loss.  She is status post intravenous iron in August 2020 with normalization of her hemoglobin.  At this time I see no evidence recurrent severe iron deficiency and need for repeat intravenous iron.  I recommended active surveillance and repeat iron studies in 6 months.  2.  Thrombocytopenia: Likely related to her liver disease.  Her platelet count continues to be more than adequate for any surgical procedure.  3.  Follow-up: We will be in 6 months for repeat laboratory testing.  I discussed the assessment and treatment plan with the patient. The patient was provided an opportunity to ask questions and all were answered. The patient agreed with the plan and demonstrated an understanding of the instructions.   The  patient was advised to call back or seek an in-person evaluation if the symptoms worsen or if the condition fails to improve as anticipated.  I provided 20  minutes of non face-to-face telephone visit time during this encounter, and > 50% was gated to reviewing laboratory data, parental diagnosis and answering questions regarding future plan of care  Zola Button, MD 12/16/2018 9:51 AM

## 2018-12-17 ENCOUNTER — Telehealth: Payer: Self-pay | Admitting: Oncology

## 2018-12-17 NOTE — Telephone Encounter (Signed)
Tried to call regarding schedule

## 2018-12-21 ENCOUNTER — Other Ambulatory Visit: Payer: Self-pay

## 2018-12-21 ENCOUNTER — Ambulatory Visit (HOSPITAL_COMMUNITY): Payer: Self-pay | Admitting: Dentistry

## 2018-12-21 ENCOUNTER — Encounter (HOSPITAL_COMMUNITY): Payer: Self-pay | Admitting: Dentistry

## 2018-12-21 VITALS — BP 132/66 | HR 57 | Temp 98.4°F

## 2018-12-21 DIAGNOSIS — K030 Excessive attrition of teeth: Secondary | ICD-10-CM

## 2018-12-21 DIAGNOSIS — I350 Nonrheumatic aortic (valve) stenosis: Secondary | ICD-10-CM

## 2018-12-21 DIAGNOSIS — K06010 Localized gingival recession, unspecified: Secondary | ICD-10-CM

## 2018-12-21 DIAGNOSIS — M264 Malocclusion, unspecified: Secondary | ICD-10-CM

## 2018-12-21 DIAGNOSIS — K029 Dental caries, unspecified: Secondary | ICD-10-CM

## 2018-12-21 DIAGNOSIS — Z01818 Encounter for other preprocedural examination: Secondary | ICD-10-CM

## 2018-12-21 DIAGNOSIS — K031 Abrasion of teeth: Secondary | ICD-10-CM

## 2018-12-21 DIAGNOSIS — K0530 Chronic periodontitis, unspecified: Secondary | ICD-10-CM

## 2018-12-21 DIAGNOSIS — Z9189 Other specified personal risk factors, not elsewhere classified: Secondary | ICD-10-CM

## 2018-12-21 DIAGNOSIS — K036 Deposits [accretions] on teeth: Secondary | ICD-10-CM

## 2018-12-21 DIAGNOSIS — I2510 Atherosclerotic heart disease of native coronary artery without angina pectoris: Secondary | ICD-10-CM

## 2018-12-21 DIAGNOSIS — M270 Developmental disorders of jaws: Secondary | ICD-10-CM

## 2018-12-21 DIAGNOSIS — K08409 Partial loss of teeth, unspecified cause, unspecified class: Secondary | ICD-10-CM

## 2018-12-21 DIAGNOSIS — K083 Retained dental root: Secondary | ICD-10-CM

## 2018-12-21 NOTE — Progress Notes (Signed)
DENTAL CONSULTATION  Date of Consultation:  12/21/2018 Patient Name:   Kelly [name] Date of Birth:   22-Jul-1951 Medical Record Number: [id number]  VITALS: BP 132/66 (BP Location: Right Arm)   Pulse (!) 57   Temp 98.4 F (36.9 C)   CHIEF COMPLAINT: Patient was referred by Dr. Roxy Manns for a dental consultation.  HPI: Kelly [name] is a 68 year old female recently diagnosed with severe aortic stenosis. Patient with anticipated TAVR procedure with Dr. Roxy Manns. Dental consultation was initially deferred secondary to the coronavirus pandemic.  Patient is now seen as part of a medically necessary pre-heart valve surgery dental protocol examination to rule out dental infection that [place] affect the patient's systemic health and anticipated heart valve surgery.  The patient currently denies acute toothaches, swellings, or abscesses. Patient is not been seen for "a good while".  This was at least 10 years. Patient was last seen by Dr. Conley Simmonds.  Patient had a tooth extracted and her maxillary cast partial denture was then repaired with the addition of the tooth. Patient also had several dental "fillings" at that time. 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  PROBLEM LIST: Patient Active Problem List   Diagnosis Date Noted  . Severe aortic stenosis     Priority: High  . Coronary artery disease involving native coronary artery without angina pectoris   . Incidental pulmonary nodule, greater than or equal to 40m 10/14/2018  . Esophageal varices determined by endoscopy ([facility]   . Chronic upper GI bleeding   . Polyp of transverse colon   . Esophageal varices without bleeding ([facility]   . Portal hypertensive gastropathy ([facility]   . Iron deficiency anemia 03/06/2018  . Aortic stenosis   . Vitamin B 12 deficiency 09/16/2016  . Dementia ([facility] 09/16/2016  . Splenomegaly 09/16/2016  . Liver cirrhosis secondary to NASH ([place] 09/16/2016  .  Thrombocytopenia ([facility] 09/15/2016  . DM (diabetes mellitus), type 2 ([facility] 09/15/2016  . Acute encephalopathy 09/14/2016  . Benzodiazepine abuse ([facility] 09/14/2016  . Hypertension 09/14/2016  . Depression 09/14/2016  . Benzodiazepine overdose 09/14/2016  . NONSPEC ELEVATION OF LEVELS OF TRANSAMINASE/LDH 02/28/2009  . DIVERTICULITIS OF COLON 06/14/2008  . Irritable bowel syndrome 04/06/2008    PMH: Past Medical History:  Diagnosis Date  . Acute encephalopathy 09/14/2016  . Adenomatous polyp   . Anxiety   . Aortic stenosis   . Arrhythmia   . Chronic upper GI bleeding    intestinal AVM's, portal gastropathy, esophageal varices on egd 04/2018  . Coronary artery disease involving native coronary artery without angina pectoris   . Dementia ([facility] 09/16/2016  . DIVERTICULITIS OF COLON 06/14/2008   Qualifier: Diagnosis of  By: LMat Carne   . DM (diabetes mellitus), type 2 ([facility] 09/15/2016  . Esophageal varices determined by endoscopy (HH[facility]   . Fatty liver   . Hyperlipidemia   . Hypertension   . Incidental pulmonary nodule, greater than or equal to 875m3/06/2019   LLL nodule needs f/u CT or PET-CT scan  . Irritable bowel syndrome 04/06/2008   Centricity Description: IBS Qualifier: Diagnosis of  By: LeBobby RumpfMA (AADeborra Medina [name]   Centricity Description: IRRITABLE BOWEL SYNDROME Qualifier: Diagnosis of  By: JaArdis HughsD, DaMelene Plan . Liver cirrhosis secondary to NASH (HCM[facility]2/07/2017  . NASH (nonalcoholic steatohepatitis)   . Obesity   . Portal hypertensive gastropathy ([facility]CC[facility]  . SOB (shortness of breath)   . Splenomegaly 09/16/2016  .  Thrombocytopenia ([place])   . Thyroid disease   . Vitamin B 12 deficiency 09/16/2016  . Vitamin D deficiency     PSH: Past Surgical History:  Procedure Laterality Date  . COLONOSCOPY WITH PROPOFOL N/A 04/09/2018   Procedure: COLONOSCOPY WITH PROPOFOL;  Surgeon: [name], MD;  Location: [facility];  Service: Endoscopy;  Laterality: N/A;  .  ESOPHAGOGASTRODUODENOSCOPY (EGD) WITH PROPOFOL N/A 04/09/2018   Procedure: ESOPHAGOGASTRODUODENOSCOPY (EGD) WITH PROPOFOL;  Surgeon: [name], MD;  Location: [facility];  Service: Endoscopy;  Laterality: N/A;  . oopherectomy    . OVARIAN CYST REMOVAL    . POLYPECTOMY  04/09/2018   Procedure: POLYPECTOMY;  Surgeon: [name], MD;  Location: [facility];  Service: Endoscopy;;  . RIGHT/LEFT HEART CATH AND CORONARY ANGIOGRAPHY N/A 10/07/2018   Procedure: RIGHT/LEFT HEART CATH AND CORONARY ANGIOGRAPHY;  Surgeon: [name], MD;  Location: [facility];  Service: Cardiovascular;  Laterality: N/A;    ALLERGIES: Allergies  Allergen Reactions  . Celecoxib     Unknown reaction  . Prednisone Itching    Heart rate increased  . Macrodantin [Nitrofurantoin Macrocrystal] Rash  . Penicillins Hives    Did it involve swelling of the face/tongue/throat, SOB, or low BP? Unknown Did it involve sudden or severe rash/hives, skin peeling, or any reaction on the inside of your mouth or nose? Unknown Did you need to seek medical attention at a hospital or doctor's office? Unknown When did it last happen?20 + years If all above answers are "NO", [place] proceed with cephalosporin use.     MEDICATIONS: Current Outpatient Medications  Medication Sig Dispense Refill  . ACCU-CHEK AVIVA PLUS test strip     . ACCU-CHEK SOFTCLIX LANCETS lancets     . amLODipine (NORVASC) 10 MG tablet Take 1 tablet (10 mg total) by mouth daily. 90 tablet 3  . atorvastatin (LIPITOR) 20 MG tablet TAKE 1 TABLET (20 MG TOTAL) BY MOUTH DAILY. 90 tablet 3  . Blood Glucose Monitoring Suppl (ACCU-CHEK AVIVA PLUS) w/Device KIT     . ferrous sulfate 325 (65 FE) MG tablet Take 1 tablet (325 mg total) by mouth daily. 90 tablet 3  . furosemide (LASIX) 20 MG tablet Take 1 tablet (20 mg total) by mouth daily. 90 tablet 3  . levothyroxine (SYNTHROID, LEVOTHROID) 75 MCG tablet TAKE 1 TABLET EVERY DAY (Patient  taking differently: Take 75 mcg by mouth daily. ) 90 tablet 3  . losartan (COZAAR) 100 MG tablet Take 1 tablet (100 mg total) by mouth daily. Stop lisinopril hctz (Patient taking differently: Take 100 mg by mouth daily. ) 90 tablet 3  . methylcellulose (CITRUCEL) oral powder Take 1 packet by mouth daily as needed (constipation).    . potassium chloride SA (K-DUR,KLOR-CON) 20 MEQ tablet Take 1 tablet (20 mEq total) by mouth daily. 90 tablet 3  . propranolol (INDERAL) 40 MG tablet Take 1 tablet (40 mg total) by mouth 2 (two) times daily. 180 tablet 0  . LORazepam (ATIVAN) 0.5 MG tablet TAKE 1/2 TABLET BY MOUTH EVERY 8 HOURS AS NEEDED (Patient not taking: Reported on 12/21/2018) 10 tablet 0  . metFORMIN (GLUCOPHAGE-XR) 500 MG 24 hr tablet TAKE 2 TABLETS TWICE DAILY (Patient not taking: Reported on 12/21/2018) 360 tablet 1  . pantoprazole (PROTONIX) 40 MG tablet Take 40 mg by mouth daily.     Current Facility-Administered Medications  Medication Dose Route Frequency Provider Last Rate Last Dose  . cyanocobalamin ((VITAMIN B-12)) injection 1,000 mcg  1,000 mcg Subcutaneous Q30 days Jenna Luo T, MD   1,000 mcg at 09/08/17 1408    LABS: Lab Results  Component Value Date   WBC 7.7 10/28/2018   HGB 11.9 10/28/2018   HCT 34.6 (L) 10/28/2018   MCV 88.5 10/28/2018   PLT 117 (L) 10/28/2018      Component Value Date/Time   NA 141 10/28/2018 1115   K 4.5 10/28/2018 1115   CL 108 10/28/2018 1115   CO2 27 10/28/2018 1115   GLUCOSE 165 (H) 10/28/2018 1115   BUN 14 10/28/2018 1115   CREATININE 0.81 10/28/2018 1115   CALCIUM 9.3 10/28/2018 1115   GFRNONAA 74 10/28/2018 1115   GFRAA 85 10/28/2018 1115   Lab Results  Component Value Date   INR 1.1 (H) 07/13/2018   INR 1.1 (H) 03/10/2018   INR 1.1 (H) 12/18/2016   No results found for: PTT  SOCIAL HISTORY: Social History   Socioeconomic History  . Marital status: Married    Spouse name: Not on file  . Number of children: Not on file   . Years of education: Not on file  . Highest education level: Not on file  Occupational History  . Not on file  Social Needs  . Financial resource strain: Not on file  . Food insecurity:    Worry: Not on file    Inability: Not on file  . Transportation needs:    Medical: Not on file    Non-medical: Not on file  Tobacco Use  . Smoking status: Current Every Day Smoker    Packs/day: 0.50    Types: Cigarettes  . Smokeless tobacco: Never Used  Substance and Sexual Activity  . Alcohol use: No  . Drug use: No  . Sexual activity: Not Currently  Lifestyle  . Physical activity:    Days per week: Not on file    Minutes per session: Not on file  . Stress: Not on file  Relationships  . Social connections:    Talks on phone: Not on file    Gets together: Not on file    Attends religious service: Not on file    Active member of club or organization: Not on file    Attends meetings of clubs or organizations: Not on file    Relationship status: Not on file  . Intimate partner violence:    Fear of current or ex partner: Not on file    Emotionally abused: Not on file    Physically abused: Not on file    Forced sexual activity: Not on file  Other Topics Concern  . Not on file  Social History Narrative  . Not on file    FAMILY HISTORY: Family History  Problem Relation Age of Onset  . Lung cancer Father   . Heart disease Father        MI at age 58  . Alcohol abuse Father   . Alzheimer's disease Mother   . Dementia Mother   . Other Mother        twisted colon   . Stomach cancer Neg Hx   . Colon cancer Neg Hx     REVIEW OF SYSTEMS: Reviewed with the patient as per History of present illness. 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  DENTAL HISTORY: CHIEF COMPLAINT: Patient was referred by Dr. Roxy Manns for a dental consultation.  HPI: Kelly [name] is a 68 year old female recently diagnosed with severe aortic stenosis. Patient with anticipated  TAVR  procedure with Dr. Roxy Manns. Dental consultation was initially deferred secondary to the coronavirus pandemic.  Patient is now seen as part of a medically necessary pre-heart valve surgery dental protocol examination to rule out dental infection that Staheli affect the patient's systemic health and anticipated heart valve surgery.  The patient currently denies acute toothaches, swellings, or abscesses. Patient is not been seen for "a good while".  This was at least 10 years. Patient was last seen by Dr. Conley Simmonds.  Patient had a tooth extracted and her maxillary cast partial denture was then repaired with the addition of the tooth. Patient also had several dental "fillings" at that time. 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  DENTAL EXAMINATION: GENERAL:  The patient is a well-developed, well-nourished female in acute distress. HEAD AND NECK:  There is no palpable neck lymphadenopathy. The patient denies acute TMJ symptoms. INTRAORAL EXAM: The patient has normal saliva. There is no evidence of oral abscess formation.  The patient has a mid palatal torus.  DENTITION:  The patient has multiple missing teeth including tooth numbers 1, 2, 3, 7, 8, 9, 10, 13, 14, 16, 17, 19, 29, and 32. Tooth #11 is present as a retained root segment. Multiple flexure lesions are noted. There is evidence of excessive incisal attrition. PERIODONTAL:  The patient has chronic periodontitis with plaque and calculus accumulations, gingival recession, and incipient to moderate bone loss. Radiographic calculus is noted. DENTAL CARIES/SUBOPTIMAL RESTORATIONS: Dental caries are noted as per dental charting form. Flexure lesions are noted. ENDODONTIC:  The patient currently denies acute pulpitis symptoms.  Patient has previous root canal therapies associated with tooth numbers 23 and 25. CROWN AND BRIDGE: The patient has crowns on tooth numbers 23, 24, and  25. PROSTHODONTIC: The patient has a maxillary cast partial denture. The cast partial denture is stable with less than ideal retention. The patient denies having a mandibular partial denture. OCCLUSION:  The patient has a poor occlusal scheme secondary to multiple missing teeth, retained root segment #11, supra-eruption and drifting of the unopposed teeth into the edentulous areas, and lack of replacement of all missing teeth with dental prostheses.  RADIOGRAPHIC INTERPRETATION: An orthopantogram was taken today. This was supplemented with a full series of dental radiographs. There are multiple missing teeth. There is a retained root segment #11. Dental caries are noted. There is incipient to moderate bone loss noted. Radiographic calculus is noted. There is supra-eruption and drifting of the unopposed teeth into the edentulous areas. There are previous root canal therapies associated with tooth numbers 23 and 25.  There are multiple dental restorations noted including PFM crowns on tooth numbers 23, 24, and 25.  ASSESSMENTS: 1. Severe aortic stenosis 2. Coronary artery disease 3. Pre-heart valve surgery dental protocol 4. Retained root segment #11 5. Dental caries 6. Multiple flexure lesions 7. Excessive incisal attrition 8. Chronic periodontitis with bone loss 9. Gingival recession 10. Accretions 11. Maxillary cast partial denture in need of denture repair for replacement of soon to be extracted tooth #11 12. At risk for bleeding secondary to history of thrombocytopenia 13. Need for antibiotic premedication prior to invasive dental procedures after the anticipated aortic valve replacement.  PLAN/RECOMMENDATIONS: 1. I discussed the risks, benefits, and complications of various treatment options with the patient in relationship to her medical and dental conditions, anticipated heart valve surgery, possible percutaneous intervention and stenting of her coronary arteries, and risk for  endocarditis. We discussed various treatment  options to include no treatment, extraction of retained root segment #11 with alveoloplasty, pre-prosthetic surgery as indicated, periodontal therapy, dental restorations, root canal therapy, crown and bridge therapy, implant therapy, and replacement of missing teeth as indicated. We also discussed referral to an oral surgeon for the extraction of tooth #11 with or without IV sedation as indicated for her needle phobia.The patient currently wishes to proceed with referral to Dr. Frederik Schmidt for extraction of tooth #11 with IV sedation if the patient so desires. The patient will then need to follow-up with the primary dentist of her choice for periodontal therapy, dental restorations, and evaluation for replacement of missing teeth as indicated once medically stable from the anticipated heart valve surgery in 3- 6 months.  The patient will require antibiotic premedication prior to invasive dental procedures after the heart valve surgery per American Heart Association guidelines.   2. Discussion of findings with medical team and coordination of future medical and dental care as needed.  I spent in excess of  120 minutes during the conduct of this consultation and >50% of this time involved direct face-to-face encounter for counseling and/or coordination of the patient's care.    Lenn Cal, DDS

## 2018-12-21 NOTE — Patient Instructions (Signed)
[facility]    Department of Dental Medicine     DR. Federica Allport      HEART VALVES AND MOUTH CARE:  FACTS:   If you have any infection in your mouth, it can infect your heart valve.  If you heart valve is infected, you will be seriously ill.  Infections in the mouth can be SILENT and do not always cause pain.  Examples of infections in the mouth are gum disease, dental cavities, and abscesses.  Some possible signs of infection are: Bad breath, bleeding gums, or teeth that are sensitive to sweets, hot, and/or cold. There are many other signs as well.  WHAT YOU HAVE TO DO:   Brush your teeth after meals and at bedtime. Spend at least 2 minutes brushing well, especially behind your back teeth and all around your teeth that stand alone. Brush at the gumline also.  Do not go to bed without brushing your teeth and flossing.  If you gums bleed when you brush or floss, do NOT stop brushing or flossing. It usually means that your gums need more attention and better cleaning.   If your Dentist or Dr. Norvel Wenker gave you a prescription mouthwash to use, make sure to use it as directed. If you run out of the medication, get a refill at the pharmacy.   If you were given any other medications or directions by your Dentist, please follow them. If you did not understand the directions or forget what you were told, please call. We will be happy to refresh her memory.  If you need antibiotics before dental procedures, make sure you take them one hour prior to every dental visit as directed.   Get a dental checkup every 4-6 months in order to keep your mouth healthy, or to find and treat any new infection. You will most likely need your teeth cleaned or gums treated at the same time.  If you are not able to come in for your scheduled appointment, call your Dentist as soon as possible to reschedule.  If you have a problem in between dental visits, call your Dentist.  

## 2018-12-25 ENCOUNTER — Other Ambulatory Visit: Payer: Self-pay | Admitting: Family Medicine

## 2018-12-25 NOTE — Telephone Encounter (Signed)
Requesting refill    Lorazepam  LOV: 10/28/18  LRF:  11/12/18

## 2018-12-30 ENCOUNTER — Other Ambulatory Visit: Payer: Self-pay | Admitting: Gastroenterology

## 2019-01-05 ENCOUNTER — Telehealth: Payer: Self-pay | Admitting: Family Medicine

## 2019-01-05 NOTE — Telephone Encounter (Signed)
Requested Prescriptions   Pending Prescriptions Disp Refills  . LORazepam (ATIVAN) 0.5 MG tablet [Pharmacy Med Name: LORAZEPAM 0.5 MG TABLET] 10 tablet 0    Sig: TAKE 1/2 TABLET BY MOUTH EVERY 8 HOURS AS NEEDED   Last OV 10/28/2018 Last written 12/25/2018

## 2019-01-11 ENCOUNTER — Ambulatory Visit (INDEPENDENT_AMBULATORY_CARE_PROVIDER_SITE_OTHER): Payer: Medicare HMO | Admitting: Gastroenterology

## 2019-01-11 ENCOUNTER — Encounter: Payer: Self-pay | Admitting: Gastroenterology

## 2019-01-11 ENCOUNTER — Other Ambulatory Visit: Payer: Self-pay

## 2019-01-11 VITALS — Ht 64.0 in | Wt 216.0 lb

## 2019-01-11 DIAGNOSIS — K7460 Unspecified cirrhosis of liver: Secondary | ICD-10-CM

## 2019-01-11 DIAGNOSIS — K7581 Nonalcoholic steatohepatitis (NASH): Secondary | ICD-10-CM

## 2019-01-11 NOTE — Progress Notes (Signed)
Review of gastrointestinal problems:  1. small tubular adenoma in colon,removed September, 2009. Next colonoscopy September 2014  2. left-sided diverticulosiswith diverticular associated edema noted by colonoscopy 2009  3. abnormal liver tests noted July, 2010: Likely from NASH Blood work August, 2010: Hepatitis A., B., C. were all negative; alpha one antitrypsin level normal, ceruloplasmin level normal, AMA negative, AMA negative, iron studies normal, anti-smooth muscle antibody negative, AST and ALT in 50-70 range; TTG negative, IgA level normal. Liver ultrasound suggested fatty liver disease.  [place] 01/2018"mild cirrhosis" without mass lesion ([place] 09/2016 "suspected cirrhosis, no focal hepatic lesion, + splenomegaly").  [place] 07/2018 cirrhosis without liver masses or ascites.  MELD 7 (07/2018 labs)  AFP normal (07/2018 labs)  EGD 04/2018 medium to large esophageal varices, mod to severe portal gastropathy. Started propranolol. 4. Change in bowels, minor bleeding; Colonoscopy [place] 2014: The colonic mucosa was slightly erythematous throughout. Biopsies were taken randomly and sent to pathology. There were multiple diverticulum in the left colon. The terminal ileum was normal. No polyps or cancers.  5. History of polyps: Colonoscopy 04/2018 Dr. [name]: single small TA removed, recall in 5 years.   This service was provided via virtual visit.  Only audio was used.  The patient was located at home.  I was located in my office.  The patient did consent to this virtual visit and is aware of possible charges through their insurance for this visit.  The patient is an established patient.  My certified medical assistant, Grace Bushy, contributed to this visit by contacting the patient by phone 1 or 2 business days prior to the appointment and also followed up on the recommendations I made after the visit.  Time spent on virtual visit: 21 minutes   HPI: This is a very pleasant 68 year old woman whom I last  saw about 6 months ago.  She is here today for routine follow-up for her cirrhosis  She was recently found to have severe symptomatic aortic stenosis as well as multivessel coronary artery disease.  She has met with a cardiothoracic surgeon to consider options.  She is likely having coronary angiogram for the CAD first.    Her biggest complaint is that she's been having a right sided back pain (mid way down), keeps her from sleeping well at night.  It is positional.  Her PCP thinks she has a muscular strain.  She asked him about cutting back on her sleeping medicine and instead try an anxiety med (lorazepam).  Laying on her left side she feels makes her heart race faster.  No lower extremity edema.  No overt GI bleeding.  Chief complaint is cirrhosis  ROS: complete GI ROS as described in HPI, all other review negative.  Constitutional:  No unintentional weight loss   Past Medical History:  Diagnosis Date  . Acute encephalopathy 09/14/2016  . Adenomatous polyp   . Anxiety   . Aortic stenosis   . Arrhythmia   . Chronic upper GI bleeding    intestinal AVM's, portal gastropathy, esophageal varices on egd 04/2018  . Coronary artery disease involving native coronary artery without angina pectoris   . Dementia ([place]) 09/16/2016  . DIVERTICULITIS OF COLON 06/14/2008   Qualifier: Diagnosis of  By: [name]    . DM (diabetes mellitus), type 2 ([place]) 09/15/2016  . Esophageal varices determined by endoscopy ([place])   . Fatty liver   . Hyperlipidemia   . Hypertension   . Incidental pulmonary nodule, greater than or equal to 13m 10/14/2018  LLL nodule needs f/u CT or PET-CT scan  . Irritable bowel syndrome 04/06/2008   Centricity Description: IBS Qualifier: Diagnosis of  By: [name] CMA ([name]), [name]   Centricity Description: IRRITABLE BOWEL SYNDROME Qualifier: Diagnosis of  By: [name] MD, [name]   . Liver cirrhosis secondary to NASH ([place]) 09/16/2016  . NASH (nonalcoholic steatohepatitis)    . Obesity   . Portal hypertensive gastropathy ([place])   . SOB (shortness of breath)   . Splenomegaly 09/16/2016  . Thrombocytopenia ([place])   . Thyroid disease   . Vitamin B 12 deficiency 09/16/2016  . Vitamin D deficiency     Past Surgical History:  Procedure Laterality Date  . COLONOSCOPY WITH PROPOFOL N/A 04/09/2018   Procedure: COLONOSCOPY WITH PROPOFOL;  Surgeon: [name], MD;  Location: [facility];  Service: Endoscopy;  Laterality: N/A;  . ESOPHAGOGASTRODUODENOSCOPY (EGD) WITH PROPOFOL N/A 04/09/2018   Procedure: ESOPHAGOGASTRODUODENOSCOPY (EGD) WITH PROPOFOL;  Surgeon: [name], MD;  [facility];  Service: Endoscopy;  Laterality: N/A;  . oopherectomy    . OVARIAN CYST REMOVAL    . POLYPECTOMY  04/09/2018   Procedure: POLYPECTOMY;  Surgeon: [name], MD;  [facility]: [facility];  Service: Endoscopy;;  . RIGHT/LEFT HEART CATH AND CORONARY ANGIOGRAPHY N/A 10/07/2018   Procedure: RIGHT/LEFT HEART CATH AND CORONARY ANGIOGRAPHY;  Surgeon: [name], MD;  Location: [facility];  Service: Cardiovascular;  Laterality: N/A;    Current Outpatient Medications  Medication Sig Dispense Refill  . ACCU-CHEK AVIVA PLUS test strip     . ACCU-CHEK SOFTCLIX LANCETS lancets     . amLODipine (NORVASC) 10 MG tablet Take 1 tablet (10 mg total) by mouth daily. 90 tablet 3  . atorvastatin (LIPITOR) 20 MG tablet TAKE 1 TABLET (20 MG TOTAL) BY MOUTH DAILY. 90 tablet 3  . Blood Glucose Monitoring Suppl (ACCU-CHEK AVIVA PLUS) w/Device KIT     . ferrous sulfate 325 (65 FE) MG tablet Take 1 tablet (325 mg total) by mouth daily. 90 tablet 3  . furosemide (LASIX) 20 MG tablet Take 1 tablet (20 mg total) by mouth daily. 90 tablet 3  . levothyroxine (SYNTHROID, LEVOTHROID) 75 MCG tablet TAKE 1 TABLET EVERY DAY (Patient taking differently: Take 75 mcg by mouth daily. ) 90 tablet 3  . LORazepam (ATIVAN) 0.5 MG tablet TAKE 1/2 TABLET BY MOUTH EVERY 8 HOURS AS NEEDED 10  tablet 0  . losartan (COZAAR) 100 MG tablet Take 1 tablet (100 mg total) by mouth daily. Stop lisinopril hctz (Patient taking differently: Take 100 mg by mouth daily. ) 90 tablet 3  . methylcellulose (CITRUCEL) oral powder Take 1 packet by mouth daily as needed (constipation).    . pantoprazole (PROTONIX) 40 MG tablet Take 40 mg by mouth daily.    . potassium chloride SA (K-DUR,KLOR-CON) 20 MEQ tablet Take 1 tablet (20 mEq total) by mouth daily. 90 tablet 3  . propranolol (INDERAL) 40 MG tablet Take 1 tablet (40 mg total) by mouth 2 (two) times daily. 180 tablet 0   Current Facility-Administered Medications  Medication Dose Route Frequency Provider Last Rate Last Dose  . cyanocobalamin ((VITAMIN B-12)) injection 1,000 mcg  1,000 mcg Subcutaneous Q30 days [name], MD   1,000 mcg at 09/08/17 1408    Allergies as of 01/11/2019 - Review Complete 01/11/2019  Allergen Reaction Noted  . Celecoxib    . Prednisone Itching 01/13/2013  . Macrodantin [nitrofurantoin macrocrystal] Rash 06/03/2016  . Penicillins Hives 06/03/2016  Family History  Problem Relation Age of Onset  . Lung cancer Father   . Heart disease Father        MI at age 69  . Alcohol abuse Father   . Alzheimer's disease Mother   . Dementia Mother   . Other Mother        twisted colon   . Stomach cancer Neg Hx   . Colon cancer Neg Hx     Social History   Socioeconomic History  . Marital status: Married    Spouse name: Not on file  . Number of children: 1  . Years of education: Not on file  . Highest education level: Not on file  Occupational History  . Not on file  Social Needs  . Financial resource strain: Not on file  . Food insecurity:    Worry: Not on file    Inability: Not on file  . Transportation needs:    Medical: Not on file    Non-medical: Not on file  Tobacco Use  . Smoking status: Current Every Day Smoker    Packs/day: 0.50    Types: Cigarettes  . Smokeless tobacco: Never Used   Substance and Sexual Activity  . Alcohol use: No  . Drug use: Not Currently    Types: Benzodiazepines  . Sexual activity: Not Currently  Lifestyle  . Physical activity:    Days per week: Not on file    Minutes per session: Not on file  . Stress: Not on file  Relationships  . Social connections:    Talks on phone: Not on file    Gets together: Not on file    Attends religious service: Not on file    Active member of club or organization: Not on file    Attends meetings of clubs or organizations: Not on file    Relationship status: Not on file  . Intimate partner violence:    Fear of current or ex partner: Not on file    Emotionally abused: Not on file    Physically abused: Not on file    Forced sexual activity: Not on file  Other Topics Concern  . Not on file  Social History Narrative  . Not on file     Physical Exam: Unable to perform because this was a "telemed visit" due to current Covid-19 pandemic  Assessment and plan: 68 y.o. female with cirrhosis  She has esophageal varices but no ascites, lower extremity edema, encephalopathy.  Her meld score is quite low at 7 as of 6 months ago at least.  She needs restaging of her liver disease with CBC, complete metabolic profile, coags.  She needs hepatoma screening with alpha-fetoprotein and ultrasound.  Please see the "Patient Instructions" section for addition details about the plan.  Owens Loffler, MD [facility] 01/11/2019, 2:47 PM

## 2019-01-11 NOTE — Patient Instructions (Addendum)
  We will arrange for hepatoma screening with right upper quadrant ultrasound.  She will come in for blood testing to restage her liver disease including a CBC, complete metabolic profile, coags and alpha-fetoprotein.  Return office visit with Dr. [name], in person, in 6 months  You have been scheduled for an abdominal ultrasound at [facility] (1st floor of hospital) on 01/21/19 at 10am. Please arrive 15 minutes prior to your appointment for registration. Make certain not to have anything to eat or drink 6 hours prior to your appointment. Should you need to reschedule your appointment, please contact radiology at [phone]. This test typically takes about 30 minutes to perform.  Thank you for entrusting me with your care and choosing [facility].  Dr [name]

## 2019-01-11 NOTE — Telephone Encounter (Signed)
Pt called and states that the Ativan tablet is too small to be cutting in half. She can get a half of it after cutting but the other half crumbles. Wants to know if we could change this so that she does not have to cut in half???

## 2019-01-12 ENCOUNTER — Other Ambulatory Visit: Payer: Self-pay | Admitting: Family Medicine

## 2019-01-12 MED ORDER — PANTOPRAZOLE SODIUM 40 MG PO TBEC: 40.0000 mg | DELAYED_RELEASE_TABLET | Freq: Every day | ORAL | 3 refills | Status: DC

## 2019-01-12 NOTE — Telephone Encounter (Signed)
Unfortunately they do not make a smaller dose than what she is already taking

## 2019-01-12 NOTE — Telephone Encounter (Signed)
Spoke to pt in regards to her medications and she has lots of questions - apt made.

## 2019-01-12 NOTE — Addendum Note (Signed)
Addended by: Shary Decamp B on: 01/12/2019 04:49 PM   Modules accepted: Orders

## 2019-01-14 ENCOUNTER — Ambulatory Visit (INDEPENDENT_AMBULATORY_CARE_PROVIDER_SITE_OTHER): Payer: Medicare HMO | Admitting: Family Medicine

## 2019-01-14 ENCOUNTER — Other Ambulatory Visit: Payer: Self-pay

## 2019-01-14 DIAGNOSIS — F5101 Primary insomnia: Secondary | ICD-10-CM

## 2019-01-14 MED ORDER — ZOLPIDEM TARTRATE 5 MG PO TABS: 5.0000 mg | ORAL_TABLET | Freq: Every evening | ORAL | 1 refills | Status: DC | PRN

## 2019-01-14 NOTE — Progress Notes (Signed)
Subjective:    Patient ID: Kelly [name], female    DOB: [birth year], 68 y.o.   MRN: [id number]  HPI  10/26/18 Patient is a 68 year old Caucasian female who agrees to be seen today via telemedicine.  She is currently at home.  I am in my office.  Patient states that she is concerned she [place] have pneumonia.  She states that her symptoms are similar to what she experienced 2 years ago when she was diagnosed with pneumonia.  She was recently diagnosed with a sinus infection and was given a Z-Pak.  This was done via telephone approximately 10 days ago.  However she is now developed a pressure-like sensation in her back just medial to the scapula on the right-hand side.  She is also coughing.  Cough is nonproductive.  She reports some pleurisy in the back on that right-hand side.  She reports subjective fevers although she does not have a working thermometer.  But she does feel hot and has chills.  She denies any hemoptysis.  She denies any anterior chest pain.  She denies any pressure under her sternum.  She denies any nausea or vomiting or radiation of the pain into her neck or down her left arm.  She has a significant past medical history of severe aortic stenosis.  She also has coronary artery disease however she denies any angina or angina-like chest pain.  She denies any rhinorrhea or sore throat.  She denies any recent travel.  At that time, my plan was: Patient symptoms are not concerning for coronavirus.  She has no recent travel.  Symptoms also do not sound cardiac in nature.  Given the atypical nature of her symptoms, I have asked the patient to get a chest x-ray given the unusual nature of her symptoms.  Meanwhile, I will prescribe Levaquin 500 mg daily for 7 days.  However I want to see the patient in the office for a follow-up in 24 to 48 hours if no better or immediately if worsening.  Again at 1157.  Office visit concluded at 1213  10/28/18 CXR: Stable cardiomegaly. No pneumothorax or pleural  effusion is noted. Both lungs are clear. The visualized skeletal structures are Unremarkable.  Recently underwent cardiac catheterization planning for her aortic valve replacement and was found to have significant three-vessel disease including a 95% lesion in the LAD.  Has met with cardiothoracic surgery and has been deemed a poor surgical candidate due to her other medical comorbidities.  I reviewed Dr. Ricard Dillon consultation.  He is recommended possibly having PCI and medical treatment of her coronary artery lesions.  After the patient's recent catheterization, her hemoglobin had dropped to as low as 8.2 in review of her medical records.  Patient was instructed to discontinue antibiotics.  She was advised to come into the office given the fact she was still experiencing pain.  Now she states that she is having no cough.  She is having no pleurisy.  The pain is located over top of her right scapula and just medial to her right scapula.  I am able to reproduce the pain with palpation of the thoracic paraspinal muscles and the muscles overlying the scapula.  She is also complaining of some pain and aching in her right shoulder and right tricep area.  I suspect that this pain is musculoskeletal.  Another possibility would be cervical radiculopathy.  However my original concern was possible atypical cardiac symptoms given her recent catheterization report.  My fears are alleviated today after  examining the patient.  This is almost certainly musculoskeletal.  She does complain of fatigue.  She complains of shortness of breath with activity.  However these are stable.  She does occasionally report chest discomfort.  She describes it as a pressure-like sensation located at the lower portion of her sternum.  This only occurs with activity and improves with rest.  It sounds consistent with stable angina.  She is not currently taking any nitroglycerin.  At that time, my plan was: Several issues are identified today.   First, the pain that she called and reported does not appear to be pulmonary in nature nor cardiac.  I feel convinced that this is musculoskeletal in nature and likely is simply a muscle strain.  I have recommended warm compresses and topical rub such as BenGay or Aspercreme to alleviate the pain.  I recommended no use of Tylenol given her liver history and also recommended against use of Aleve.  This is my second concern.  The patient has been using Aleve.  Given her history of chronic GI bleed, esophageal varices, and anemia as well as her coronary artery disease, I recommended strongly against any use of any NSAID.  Patient will discontinue this immediately.  Third issue is the drop in her hemoglobin after her recent catheterization.  This could be exacerbating her fatigue, her dyspnea on exertion, and even contributing to her occasional typical angina if her hemoglobin is getting below 8.  I will repeat a CBC and if her hemoglobin is falling further, she Mitzel require an iron transfusion.  This was supposed to be done now however it has been delayed until [place] due to the recent coronavirus outbreak.  If her hemoglobin is near 8 or less, we will need to expedite that.  Fourth issue is her occasional angina and shortness of breath.  Patient Galant benefit from a long-acting nitroglycerin such as Imdur if this continues and if her hemoglobin is normal.  We will defer that today but if her hemoglobin is normal and if she continues to experience occasional angina, we [place] consider adding that until definitive therapy is performed.  Last issue is anxiety.  Patient is very scared by what is going on.  She is very worried.  She is stopped taking trazodone.  She is stopped taking Ambien.  She would like something that she can use sparingly when she feels panicked or anxious or to help her sleep.  She does have mild dementia.  We discussed the risk of taking a low-dose benzodiazepine as far as exacerbating confusion however we  will try 0.25 mg Ativan every 8 hours as needed.  I will give her 10 tablets and encouraged her to use it sparingly and even have her husband monitor her use to avoid confusion and delirium.  01/14/19 Patient is being seen today as a telephone visit.  Consents to be seen by telephone.  Phone call began at [phone].  Phone call concluded at 350.  Wants to switch back to [place] from ativan.   No longer having panic attacks but really having trouble sleeping.  Ambien works better to help her sleep at night than the ativan.  Ativan helps her relax but does not make her sleepy.    Past Medical History:  Diagnosis Date   Acute encephalopathy 09/14/2016   Adenomatous polyp    Anxiety    Aortic stenosis    Arrhythmia    Chronic upper GI bleeding    intestinal AVM's, portal gastropathy, esophageal varices  on egd 04/2018   Coronary artery disease involving native coronary artery without angina pectoris    Dementia ([place]) 09/16/2016   DIVERTICULITIS OF COLON 06/14/2008   Qualifier: Diagnosis of  By: Gretta Cool RN, Malachy Mood     DM (diabetes mellitus), type 2 ([place]) 09/15/2016   Esophageal varices determined by endoscopy ([place])    Fatty liver    Hyperlipidemia    Hypertension    Incidental pulmonary nodule, greater than or equal to 30m 10/14/2018   LLL nodule needs f/u CT or PET-CT scan   Irritable bowel syndrome 04/06/2008   Centricity Description: IBS Qualifier: Diagnosis of  By: [name] (ADeborra Medina, [name]   Centricity Description: IRRITABLE BOWEL SYNDROME Qualifier: Diagnosis of  By: [name], [name]   Liver cirrhosis [place]H [place] 09/16/2016   NASH (nonalcoholic steatohepatitis)    Obesity    Portal hypertensive gastropathy ([place]    SOB (shortness of breath)    Splenomegaly 09/16/2016   Thrombocytopenia ([place]    Thyroid disease    Vitamin B 12 deficiency 09/16/2016   Vitamin D deficiency    Past Surgical History:  Procedure Laterality Date   COLONOSCOPY WITH PROPOFOL N/A  04/09/2018   Procedure: COLONOSCOPY WITH PROPOFOL;  Surgeon: JMilus Banister MD;  [facility];  Service: Endoscopy;  Laterality: N/A;   ESOPHAGOGASTRODUODENOSCOPY (EGD) WITH PROPOFOL N/A 04/09/2018   Procedure: ESOPHAGOGASTRODUODENOSCOPY (EGD) WITH PROPOFOL;  Surgeon: JMilus Banister MD;  [facility];  Service: Endoscopy;  Laterality: N/A;   oopherectomy     OVARIAN CYST REMOVAL     POLYPECTOMY  04/09/2018   Procedure: POLYPECTOMY;  Surgeon: JMilus Banister MD;  [facility];  Service: Endoscopy;;   RIGHT/LEFT HEART CATH AND CORONARY ANGIOGRAPHY N/A 10/07/2018   Procedure: RIGHT/LEFT HEART CATH AND CORONARY ANGIOGRAPHY;  Surgeon: MBurnell Blanks MD;  [facility];  Service: Cardiovascular;  Laterality: N/A;   Current Outpatient Medications on File Prior to Visit  Medication Sig Dispense Refill   ACCU-CHEK AVIVA PLUS test strip      ACCU-CHEK SOFTCLIX LANCETS lancets      amLODipine (NORVASC) 10 MG tablet Take 1 tablet (10 mg total) by mouth daily. 90 tablet 3   atorvastatin (LIPITOR) 20 MG tablet TAKE 1 TABLET (20 MG TOTAL) BY MOUTH DAILY. 90 tablet 3   Blood Glucose Monitoring Suppl (ACCU-CHEK AVIVA PLUS) w/Device KIT      ferrous sulfate 325 (65 FE) MG tablet Take 1 tablet (325 mg total) by mouth daily. 90 tablet 3   furosemide (LASIX) 20 MG tablet Take 1 tablet (20 mg total) by mouth daily. 90 tablet 3   levothyroxine (SYNTHROID, LEVOTHROID) 75 MCG tablet TAKE 1 TABLET EVERY DAY (Patient taking differently: Take 75 mcg by mouth daily. ) 90 tablet 3   LORazepam (ATIVAN) 0.5 MG tablet TAKE 1/2 TABLET BY MOUTH EVERY 8 HOURS AS NEEDED 10 tablet 0   losartan (COZAAR) 100 MG tablet Take 1 tablet (100 mg total) by mouth daily. Stop lisinopril hctz (Patient taking differently: Take 100 mg by mouth daily. ) 90 tablet 3   methylcellulose (CITRUCEL) oral powder Take 1 packet by mouth daily as needed (constipation).      pantoprazole (PROTONIX) 40 MG tablet Take 1 tablet (40 mg total) by mouth daily. 90 tablet 3   potassium chloride SA (K-DUR,KLOR-CON) 20 MEQ tablet Take 1 tablet (20 mEq total) by mouth daily. 90 tablet 3   propranolol (INDERAL) 40 MG tablet Take  1 tablet (40 mg total) by mouth 2 (two) times daily. 180 tablet 0   Current Facility-Administered Medications on File Prior to Visit  Medication Dose Route Frequency Provider Last Rate Last Dose   cyanocobalamin ((VITAMIN B-12)) injection 1,000 mcg  1,000 mcg Subcutaneous Q30 days [name], MD   1,000 mcg at 09/08/17 1408   Allergies  Allergen Reactions   Celecoxib     Unknown reaction   Prednisone Itching    Heart rate increased   Macrodantin [Nitrofurantoin Macrocrystal] Rash   Penicillins Hives    Did it involve swelling of the face/tongue/throat, SOB, or low BP? Unknown Did it involve sudden or severe rash/hives, skin peeling, or any reaction on the inside of your mouth or nose? Unknown Did you need to seek medical attention at a hospital or doctor's office? Unknown When did it last happen?20 + years If all above answers are NO, [place] proceed with cephalosporin use.    Social History   Socioeconomic History   Marital status: Married    Spouse name: Not on file   Number of children: 1   Years of education: Not on file   Highest education level: Not on file  Occupational History   Not on file  Social Needs   Financial resource strain: Not on file   Food insecurity    Worry: Not on file    Inability: Not on file   Transportation needs    Medical: Not on file    Non-medical: Not on file  Tobacco Use   Smoking status: Current Every Day Smoker    Packs/day: 0.50    Types: Cigarettes   Smokeless tobacco: Never Used  Substance and Sexual Activity   Alcohol use: No   Drug use: Not Currently    Types: Benzodiazepines   Sexual activity: Not Currently  Lifestyle   Physical activity    Days per  week: Not on file    Minutes per session: Not on file   Stress: Not on file  Relationships   Social connections    Talks on phone: Not on file    Gets together: Not on file    Attends religious service: Not on file    Active member of club or organization: Not on file    Attends meetings of clubs or organizations: Not on file    Relationship status: Not on file   Intimate partner violence    Fear of current or ex partner: Not on file    Emotionally abused: Not on file    Physically abused: Not on file    Forced sexual activity: Not on file  Other Topics Concern   Not on file  Social History Narrative   Not on file       Review of Systems  All other systems reviewed and are negative.      Objective:  No PE performed as this was a telephone visit.       Assessment & Plan:  Insomnia DC ativan and replace with ambien 5 mg poqhs prn insomnia.  Use sparingly.

## 2019-01-21 ENCOUNTER — Ambulatory Visit (HOSPITAL_COMMUNITY): Payer: Medicare HMO

## 2019-01-28 ENCOUNTER — Other Ambulatory Visit: Payer: Self-pay | Admitting: Family Medicine

## 2019-01-29 ENCOUNTER — Other Ambulatory Visit: Payer: Self-pay | Admitting: Family Medicine

## 2019-01-29 ENCOUNTER — Telehealth: Payer: Self-pay | Admitting: Family Medicine

## 2019-01-29 MED ORDER — ERYTHROMYCIN 5 MG/GM OP OINT: 1.0000 "application " | TOPICAL_OINTMENT | Freq: Every day | OPHTHALMIC | 0 refills | Status: DC

## 2019-01-29 NOTE — Telephone Encounter (Signed)
Tried to call - no answer and memory full

## 2019-01-29 NOTE — Telephone Encounter (Signed)
Pt called Kelly [name] stating that she has a stye in her eye on her lower lid and it is swollen, red and would like to know what she can do about it or can we call something in for it?

## 2019-01-29 NOTE — Telephone Encounter (Signed)
I sent erythromycin to her pharm

## 2019-02-01 MED ORDER — FLUCONAZOLE 150 MG PO TABS: 150.0000 mg | ORAL_TABLET | Freq: Once | ORAL | 0 refills | Status: DC

## 2019-02-01 NOTE — Telephone Encounter (Signed)
Pt aware and used the ointment and it feels much better

## 2019-02-03 ENCOUNTER — Other Ambulatory Visit: Payer: Self-pay

## 2019-02-03 ENCOUNTER — Other Ambulatory Visit (INDEPENDENT_AMBULATORY_CARE_PROVIDER_SITE_OTHER): Payer: Medicare HMO

## 2019-02-03 ENCOUNTER — Telehealth: Payer: Self-pay | Admitting: Cardiovascular Disease

## 2019-02-03 ENCOUNTER — Ambulatory Visit (HOSPITAL_COMMUNITY)
Admission: RE | Admit: 2019-02-03 | Discharge: 2019-02-03 | Disposition: A | Discharge status: Home / Self Care | Payer: Medicare HMO | Source: Ambulatory Visit | Attending: Gastroenterology | Admitting: Gastroenterology

## 2019-02-03 DIAGNOSIS — K7581 Nonalcoholic steatohepatitis (NASH): Secondary | ICD-10-CM | POA: Diagnosis not present

## 2019-02-03 DIAGNOSIS — K7460 Unspecified cirrhosis of liver: Secondary | ICD-10-CM | POA: Insufficient documentation

## 2019-02-03 LAB — CBC WITH DIFFERENTIAL/PLATELET
Basophils Absolute: 0 10*3/uL (ref 0.0–0.1)
Basophils Relative: 0.5 % (ref 0.0–3.0)
Eosinophils Absolute: 0.2 10*3/uL (ref 0.0–0.7)
Eosinophils Relative: 2.5 % (ref 0.0–5.0)
HCT: 35.3 % — ABNORMAL LOW (ref 36.0–46.0)
Hemoglobin: 11.9 g/dL — ABNORMAL LOW (ref 12.0–15.0)
Lymphocytes Relative: 27.3 % (ref 12.0–46.0)
Lymphs Abs: 2.3 10*3/uL (ref 0.7–4.0)
MCHC: 33.7 g/dL (ref 30.0–36.0)
MCV: 83.4 fl (ref 78.0–100.0)
Monocytes Absolute: 0.7 10*3/uL (ref 0.1–1.0)
Monocytes Relative: 8.4 % (ref 3.0–12.0)
Neutro Abs: 5.2 10*3/uL (ref 1.4–7.7)
Neutrophils Relative %: 61.3 % (ref 43.0–77.0)
Platelets: 116 10*3/uL — ABNORMAL LOW (ref 150.0–400.0)
RBC: 4.24 Mil/uL (ref 3.87–5.11)
RDW: 15.2 % (ref 11.5–15.5)
WBC: 8.5 10*3/uL (ref 4.0–10.5)

## 2019-02-03 LAB — COMPREHENSIVE METABOLIC PANEL
ALT: 10 U/L (ref 0–35)
AST: 12 U/L (ref 0–37)
Albumin: 3.7 g/dL (ref 3.5–5.2)
Alkaline Phosphatase: 116 U/L (ref 39–117)
BUN: 12 mg/dL (ref 6–23)
CO2: 23 mEq/L (ref 19–32)
Calcium: 8.9 mg/dL (ref 8.4–10.5)
Chloride: 109 mEq/L (ref 96–112)
Creatinine, Ser: 0.79 mg/dL (ref 0.40–1.20)
GFR: 71.88 mL/min (ref >60.00)
Glucose, Bld: 236 mg/dL — ABNORMAL HIGH (ref 70–99)
Potassium: 3.9 mEq/L (ref 3.5–5.1)
Sodium: 140 mEq/L (ref 135–145)
Total Bilirubin: 0.7 mg/dL (ref 0.2–1.2)
Total Protein: 6.7 g/dL (ref 6.0–8.3)

## 2019-02-03 LAB — PROTIME-INR
INR: 1.1 ratio — ABNORMAL HIGH (ref 0.8–1.0)
Prothrombin Time: 12.5 s (ref 9.6–13.1)

## 2019-02-03 NOTE — Telephone Encounter (Signed)
New Message         COVID-19 Pre-Screening Questions:   In the past 7 to 10 days have you had a cough,  shortness of breath, headache, congestion, fever (100 or greater) body aches, chills, sore throat, or sudden loss of taste or sense of smell? SOB, but coming from her health history  Have you been around anyone with known Covid 19. NO  Have you been around anyone who is awaiting Covid 19 test results in the past 7 to 10 days? NO  Have you been around anyone who has been exposed to Covid 19, or has mentioned symptoms of Covid 19 within the past 7 to 10 days? NO Pt says she cant hear out of one of her ears and she says she needs her husband to assist her. Pt husbands answered NO to all COVID questions   If you have any concerns/questions about symptoms patients report during screening (either on the phone or at threshold). Contact the provider seeing the patient or DOD for further guidance.  If neither are available contact a member of the leadership team.

## 2019-02-04 ENCOUNTER — Ambulatory Visit (INDEPENDENT_AMBULATORY_CARE_PROVIDER_SITE_OTHER): Payer: Medicare HMO | Admitting: Cardiovascular Disease

## 2019-02-04 ENCOUNTER — Encounter: Payer: Self-pay | Admitting: Cardiovascular Disease

## 2019-02-04 ENCOUNTER — Other Ambulatory Visit: Payer: Self-pay | Admitting: Family Medicine

## 2019-02-04 VITALS — BP 120/60 | HR 51 | Ht 64.0 in | Wt 215.0 lb

## 2019-02-04 DIAGNOSIS — I2510 Atherosclerotic heart disease of native coronary artery without angina pectoris: Secondary | ICD-10-CM | POA: Diagnosis not present

## 2019-02-04 DIAGNOSIS — I350 Nonrheumatic aortic (valve) stenosis: Secondary | ICD-10-CM

## 2019-02-04 LAB — AFP TUMOR MARKER: AFP-Tumor Marker: 5 ng/mL

## 2019-02-04 MED ORDER — FLUCONAZOLE 150 MG PO TABS: 150.0000 mg | ORAL_TABLET | Freq: Once | ORAL | 0 refills | Status: AC

## 2019-02-04 MED ORDER — CLOPIDOGREL BISULFATE 75 MG PO TABS: 75.0000 mg | ORAL_TABLET | Freq: Every day | ORAL | 3 refills | Status: DC

## 2019-02-04 MED ORDER — ASPIRIN EC 81 MG PO TBEC: 81.0000 mg | DELAYED_RELEASE_TABLET | Freq: Every day | ORAL | 3 refills | Status: DC

## 2019-02-04 MED ORDER — POTASSIUM CHLORIDE CRYS ER 20 MEQ PO TBCR: 20.0000 meq | EXTENDED_RELEASE_TABLET | Freq: Every day | ORAL | 3 refills | Status: DC

## 2019-02-04 NOTE — Patient Instructions (Addendum)
Medication Instructions:  1) START PLAVIX 75 mg daily. For your first dose, take 300 mg (4 tablets). 2) START ASPIRIN 81 mg daily   Labwork: TODAY!  Testing/Procedures: Your physician has requested that you have a cardiac catheterization. Cardiac catheterization is used to diagnose and/or treat various heart conditions. Doctors Schmader recommend this procedure for a number of different reasons. The most common reason is to evaluate chest pain. Chest pain can be a symptom of coronary artery disease (CAD), and cardiac catheterization can show whether plaque is narrowing or blocking your heart's arteries. This procedure is also used to evaluate the valves, as well as measure the blood flow and oxygen levels in different parts of your heart. For further information please visit [email or web address]. Please follow instruction sheet, as given.  Follow-Up: You have an appointment with Dr. Camillia Herter assistant, Melina Copa, on February 25, 2019 at 10:00AM.   CATHETERIZATION INSTRUCTIONS: You are scheduled for a Cardiac Catheterization on Thursday, July 9 with Dr. [name].  1. Please arrive at the [facility] (Main Entrance A) at [facility]: [address] [place], [place] [address] at 10:00 AM (This time is two hours before your procedure to ensure your preparation). Please wear a mask upon arrival to the hospital. If you do not have one, one will be given to you. There is currently a "no visitors" policy as well. You will need to be dropped off at the hospital. Dr. Angelena Form will call your designated party after the procedure for an update and discharge information.   2. Diet: Do not eat solid foods after midnight.  The patient Kelly [name] have clear liquids until 5am upon the day of the procedure.  3. Labs: TODAY! You will also have a COVID screening on Monday, February 08, 2019.   4. Medication instructions in preparation for your procedure:  1) MAKE SURE TO TAKE ASPIRIN AND PLAVIX the morning of  your procedure.  2) HOLD LASIX the morning of your cath.  3) you [place] take your other medications as directed with sips of water.   5. Plan for one night stay--bring personal belongings. 6. Bring a current list of your medications and current insurance cards. 7. You MUST have a responsible person to drive you home. 8. Someone MUST be with you the first 24 hours after you arrive home or your discharge will be delayed. 9. Please wear clothes that are easy to get on and off and wear slip-on shoes.  Thank you for allowing [place] to care for you!   -- [facility]

## 2019-02-04 NOTE — H&P (View-Only) (Signed)
Chief Complaint  Patient presents with   Follow-up    CAD   History of Present Illness: 68 yo female with history of CAD, DM, HTN, HLD, memory issues,  tobacco abuse, aortic stenosis, esophageal varices and hepatic cirrhosis who is here today for cardiac follow up. I saw her as a new patient in February 2019 for aortic valve stenosis. Echo December 2018 with normal LV systolic function. The aortic valve was thickened with restricted leaflet excursion, mean gradient 36 mmHg, peak gradient 59 mmHg, AVA 1.22 cm2, DVI 0.39. She had mild dyspnea with exertion with fatigue and mild dizziness at times. Her aortic stenosis was moderate by echo criteria. Echo 01/27/18 with normal LV systolic function, moderately severe AS with mean gradient of 37 mmHg, peak gradient 64 mmHg, AVA 1.17cm2, DVI 0.37. At her visit here in July 2019 she was feeling fatigued but she had also just been found to be anemic. Colonoscopy September 2019 with non bleeding AVMs and diverticular disease. Non-bleeding esophageal varices were seen on upper endoscopy. Echo 08/27/18 with LVEF=60-65%. There is moderately severe aortic stenosis with thickened and calcified valve leaflets. Mean gradient 37 mmHg, peak gradient 66 mmHg, Dimensionless index 0.31. AVA 0.88 cm2. I saw her in the office in February 2020 and she complained of more dyspnea with exertion and fatigue with dizziness. I arranged a cardiac catheterization on 10/07/18 and she was found to have severe three vessel CAD. Her gradients across the aortic valve by cath were consistent with severe AS. I referred her to CT surgery to consider CABG with AVR. She was seen by Dr. Roxy Manns 10/19/18 and not felt to be a good candidate for surgical AVR and CABG due to the small size of her aortic root. She is back today to discuss PCI prior to proceeding with TAVR. CTA chest/ abdomen/pelvis 10/14/18 showed adequate access for TAVR. There is a left lower lobe pulmonary nodule and non-specific nodular  appearing areas of ground glass attenuation in the upper lungs. (3 month f/u recommended). Cardiac CT showed adequate coronary heights with Annulus area of 317 mm2. Abdominal u/s ordered by GI (Dr. [name]) with findings consistent with hepatic cirrhosis. She is followed in the GI clinic with hepatic cirrhosis. She has undergone recent dental extraction pre TAVR.   She tells me today that she continues to have exertional dyspnea. She has no energy.     Primary Care Physician: [name], MD  Past Medical History:  Diagnosis Date   Acute encephalopathy 09/14/2016   Adenomatous polyp    Anxiety    Aortic stenosis    Arrhythmia    Chronic upper GI bleeding    intestinal AVM's, portal gastropathy, esophageal varices on egd 04/2018   Coronary artery disease involving native coronary artery without angina pectoris    Dementia ([place]) 09/16/2016   DIVERTICULITIS OF COLON 06/14/2008   Qualifier: Diagnosis of  By: Gretta Cool RN, Malachy Mood     DM (diabetes mellitus), type 2 ([place]) 09/15/2016   Esophageal varices determined by endoscopy ([place])    Fatty liver    Hyperlipidemia    Hypertension    Incidental pulmonary nodule, greater than or equal to 12m 10/14/2018   LLL nodule needs f/u CT or PET-CT scan   Irritable bowel syndrome 04/06/2008   Centricity Description: IBS Qualifier: Diagnosis of  By: [name] ([name], [name]   Centricity Description: IRRITABLE BOWEL SYNDROME Qualifier: Diagnosis of  By: [name], [name]   Liver cirrhosis secondary to NASH ([place] 09/16/2016  NASH (nonalcoholic steatohepatitis)    Obesity    Portal hypertensive gastropathy (H[facility])    SOB (shortness of breath)    Splenomegaly 09/16/2016   Thrombocytopenia ([facility])    Thyroid disease    Vitamin B 12 deficiency 09/16/2016   Vitamin D deficiency     Past Surgical History:  Procedure Laterality Date   COLONOSCOPY WITH PROPOFOL N/A 04/09/2018   Procedure: COLONOSCOPY WITH PROPOFOL;  Surgeon: [name], MD;  Location: [facility];  Service: Endoscopy;  Laterality: N/A;   ESOPHAGOGASTRODUODENOSCOPY (EGD) WITH PROPOFOL N/A 04/09/2018   Procedure: ESOPHAGOGASTRODUODENOSCOPY (EGD) WITH PROPOFOL;  Surgeon: [name], MD;  Location: [facility];  Service: Endoscopy;  Laterality: N/A;   oopherectomy     OVARIAN CYST REMOVAL     POLYPECTOMY  04/09/2018   Procedure: POLYPECTOMY;  Surgeon: [name], MD;  Location: [facility];  Service: Endoscopy;;   RIGHT/LEFT HEART CATH AND CORONARY ANGIOGRAPHY N/A 10/07/2018   Procedure: RIGHT/LEFT HEART CATH AND CORONARY ANGIOGRAPHY;  Surgeon: [name], MD;  Location: [facility];  Service: Cardiovascular;  Laterality: N/A;    Current Outpatient Medications  Medication Sig Dispense Refill   ACCU-CHEK AVIVA PLUS test strip      ACCU-CHEK SOFTCLIX LANCETS lancets      amLODipine (NORVASC) 10 MG tablet Take 1 tablet (10 mg total) by mouth daily. 90 tablet 3   atorvastatin (LIPITOR) 20 MG tablet TAKE 1 TABLET (20 MG TOTAL) BY MOUTH DAILY. 90 tablet 3   Blood Glucose Monitoring Suppl (ACCU-CHEK AVIVA PLUS) w/Device KIT      erythromycin ophthalmic ointment Place 1 application into the left eye at bedtime. 3.5 g 0   ferrous sulfate 325 (65 FE) MG tablet Take 1 tablet (325 mg total) by mouth daily. 90 tablet 3   fluconazole (DIFLUCAN) 150 MG tablet Take 1 tablet (150 mg total) by mouth once for 1 dose. 1 tablet 0   furosemide (LASIX) 20 MG tablet TAKE 1 TABLET EVERY DAY  (DISCONTINUE FUROSEMIDE 40MG TWICE DAILY) 90 tablet 0   levothyroxine (SYNTHROID, LEVOTHROID) 75 MCG tablet TAKE 1 TABLET EVERY DAY (Patient taking differently: Take 75 mcg by mouth daily. ) 90 tablet 3   LORazepam (ATIVAN) 0.5 MG tablet TAKE 1/2 TABLET BY MOUTH EVERY 8 HOURS AS NEEDED 10 tablet 0   losartan (COZAAR) 100 MG tablet Take 1 tablet (100 mg total) by mouth daily. 90 tablet 0   methylcellulose (CITRUCEL) oral powder Take 1 packet  by mouth daily as needed (constipation).     pantoprazole (PROTONIX) 40 MG tablet Take 1 tablet (40 mg total) by mouth daily. 90 tablet 3   propranolol (INDERAL) 40 MG tablet Take 1 tablet (40 mg total) by mouth 2 (two) times daily. 180 tablet 0   zolpidem (AMBIEN) 5 MG tablet Take 1 tablet (5 mg total) by mouth at bedtime as needed for sleep. Stop ativan 30 tablet 1   aspirin EC 81 MG tablet Take 1 tablet (81 mg total) by mouth daily. 90 tablet 3   clopidogrel (PLAVIX) 75 MG tablet Take 1 tablet (75 mg total) by mouth daily. 90 tablet 3   potassium chloride SA (K-DUR) 20 MEQ tablet Take 1 tablet (20 mEq total) by mouth daily. 90 tablet 3   Current Facility-Administered Medications  Medication Dose Route Frequency Provider Last Rate Last Dose   cyanocobalamin ((VITAMIN B-12)) injection 1,000 mcg  1,000 mcg Subcutaneous Q30 days [name], MD   1,000 mcg  at 09/08/17 1408    Allergies  Allergen Reactions   Celecoxib     Unknown reaction   Prednisone Itching    Heart rate increased   Macrodantin [Nitrofurantoin Macrocrystal] Rash   Penicillins Hives    Did it involve swelling of the face/tongue/throat, SOB, or low BP? Unknown Did it involve sudden or severe rash/hives, skin peeling, or any reaction on the inside of your mouth or nose? Unknown Did you need to seek medical attention at a hospital or doctor's office? Unknown When did it last happen?20 + years If all above answers are NO, Ast proceed with cephalosporin use.     Social History   Socioeconomic History   Marital status: Married    Spouse name: Not on file   Number of children: 1   Years of education: Not on file   Highest education level: Not on file  Occupational History   Not on file  Social Needs   Financial resource strain: Not on file   Food insecurity    Worry: Not on file    Inability: Not on file   Transportation needs    Medical: Not on file    Non-medical: Not on file    Tobacco Use   Smoking status: Current Every Day Smoker    Packs/day: 0.50    Types: Cigarettes   Smokeless tobacco: Never Used  Substance and Sexual Activity   Alcohol use: No   Drug use: Not Currently    Types: Benzodiazepines   Sexual activity: Not Currently  Lifestyle   Physical activity    Days per week: Not on file    Minutes per session: Not on file   Stress: Not on file  Relationships   Social connections    Talks on phone: Not on file    Gets together: Not on file    Attends religious service: Not on file    Active member of club or organization: Not on file    Attends meetings of clubs or organizations: Not on file    Relationship status: Not on file   Intimate partner violence    Fear of current or ex partner: Not on file    Emotionally abused: Not on file    Physically abused: Not on file    Forced sexual activity: Not on file  Other Topics Concern   Not on file  Social History Narrative   Not on file    Family History  Problem Relation Age of Onset   Lung cancer Father    Heart disease Father        MI at age 66   Alcohol abuse Father    Alzheimer's disease Mother    Dementia Mother    Other Mother        twisted colon    Stomach cancer Neg Hx    Colon cancer Neg Hx     Review of Systems:  As stated in the HPI and otherwise negative.   BP 120/60    Pulse (!) 51    Ht 5' 4"  (1.626 m)    Wt 215 lb (97.5 kg)    SpO2 96%    BMI 36.90 kg/m   Physical Examination:  General: Well developed, well nourished, NAD  HEENT: OP clear, mucus membranes moist  SKIN: warm, dry. No rashes. Neuro: No focal deficits  Musculoskeletal: Muscle strength 5/5 all ext  Psychiatric: Mood and affect normal  Neck: No JVD, no carotid bruits, no thyromegaly, no lymphadenopathy.  Lungs:Clear bilaterally, no wheezes, rhonci, crackles Cardiovascular: Regular rate and rhythm. Loud, harsh systolic murmur noted.  Abdomen:Soft. Bowel sounds present.  Non-tender.  Extremities: No lower extremity edema. Pulses are 2 + in the bilateral DP/PT.  Echo 08/27/18: - Left ventricle: The cavity size was normal. There was moderate   concentric hypertrophy. Systolic function was normal. The   estimated ejection fraction was in the range of 60% to 65%. Wall   motion was normal; there were no regional wall motion   abnormalities. Doppler parameters are consistent with abnormal   left ventricular relaxation (grade 1 diastolic dysfunction).   Doppler parameters are consistent with elevated mean left atrial   filling pressure. - Aortic valve: Valve mobility was restricted. There was moderate   to severe stenosis. Valve area (VTI): 0.88 cm^2. Valve area   (Vmax): 0.94 cm^2. Valve area (Vmean): 0.93 cm^2. - Left atrium: The atrium was mildly dilated. - Tricuspid valve: There was mild-moderate regurgitation.  Impressions:  - No change since June 2019.  ------------------------------------------------------------------- Study data:  Comparison was made to the study of 01/27/2018.  Study status:  Routine.  Procedure:  The patient reported no pain pre or post test. Transthoracic echocardiography. Image quality was adequate.          Transthoracic echocardiography.  M-mode, complete 2D, spectral Doppler, and color Doppler.  Birthdate: Patient birthdate: [birth year].  Age:  Patient is 68 yr old.  Sex: Gender: female.    BMI: 35.8 kg/m^2.  Blood pressure:     156/75 Patient status:  Outpatient.  Study date:  Study date: 08/27/2018. Study time: 11:42 AM.  Location:  [facility]  -------------------------------------------------------------------  ------------------------------------------------------------------- Left ventricle:  The cavity size was normal. There was moderate concentric hypertrophy. Systolic function was normal. The estimated ejection fraction was in the range of 60% to 65%. Wall motion was normal; there were no regional wall  motion abnormalities. Doppler parameters are consistent with abnormal left ventricular relaxation (grade 1 diastolic dysfunction). Doppler parameters are consistent with elevated mean left atrial filling pressure.  ------------------------------------------------------------------- Aortic valve:   Probably trileaflet; moderately thickened, moderately calcified leaflets. Valve mobility was restricted. Doppler:   There was moderate to severe stenosis.   There was no significant regurgitation.    VTI ratio of LVOT to aortic valve: 0.31. Valve area (VTI): 0.88 cm^2. Indexed valve area (VTI): 0.41 cm^2/m^2. Peak velocity ratio of LVOT to aortic valve: 0.33. Valve area (Vmax): 0.94 cm^2. Indexed valve area (Vmax): 0.44 cm^2/m^2. Mean velocity ratio of LVOT to aortic valve: 0.33. Valve area (Vmean): 0.93 cm^2. Indexed valve area (Vmean): 0.44 cm^2/m^2. Mean gradient (S): 37 mm Hg. Peak gradient (S): 66 mm Hg.  ------------------------------------------------------------------- Aorta:  Aortic root: The aortic root was normal in size. Ascending aorta: The ascending aorta was normal in size.  ------------------------------------------------------------------- Mitral valve:   Mildly thickened leaflets .  Doppler: Transvalvular velocity was within the normal range. There was no evidence for stenosis. There was no significant regurgitation. Peak gradient (D): 3 mm Hg.  ------------------------------------------------------------------- Left atrium:  The atrium was mildly dilated.  ------------------------------------------------------------------- Right ventricle:  The cavity size was normal. Systolic function was normal.  ------------------------------------------------------------------- Pulmonic valve:   Poorly visualized.  The valve appears to be grossly normal.    Doppler:  There was mild  regurgitation.  ------------------------------------------------------------------- Tricuspid valve:   Structurally normal valve.   Leaflet separation was normal.  Doppler:  Transvalvular velocity was within the normal range. There was mild-moderate regurgitation.  ------------------------------------------------------------------- Pulmonary artery:  Systolic pressure was within the normal range.   ------------------------------------------------------------------- Right atrium:  The atrium was normal in size.  ------------------------------------------------------------------- Pericardium:  There was no pericardial effusion.  ------------------------------------------------------------------- Measurements   Left ventricle                          Value           Reference  LV ID, ED, PLAX chordal                 44.6   mm       43 - 52  LV ID, ES, PLAX chordal                 27.8   mm       23 - 38  LV fx shortening, PLAX chordal          38     %        >=29  LV PW thickness, ED                     15.7   mm       ----------  IVS/LV PW ratio, ED                     0.84            <=1.3  Stroke volume, 2D                       97     ml       ----------  Stroke volume/bsa, 2D                   45     ml/m^2   ----------  LV e&', lateral                          5.22   cm/s     ----------  LV E/e&', lateral                        16.53           ----------  LV e&', medial                           3.37   cm/s     ----------  LV E/e&', medial                         25.61           ----------  LV e&', average                          4.3    cm/s     ----------  LV E/e&', average                        20.09           ----------  Longitudinal strain, TDI                19     %        ----------    Ventricular septum  Value           Reference  IVS thickness, ED                       13.26  mm       ----------    LVOT                                     Value           Reference  LVOT ID, S                              19     mm       ----------  LVOT area                               2.84   cm^2     ----------  LVOT peak velocity, S                   122.26 cm/s     ----------  LVOT mean velocity, S                   94.7   cm/s     ----------  LVOT VTI, S                             29.84  cm       ----------  LVOT peak gradient, S                   6      mm Hg    ----------  Stroke volume (SV), LVOT DP             84.6   ml       ----------  Stroke index (SV/bsa), LVOT DP          39.6   ml/m^2   ----------    Aortic valve                            Value           Reference  Aortic valve peak velocity, S           405    cm/s     ----------  Aortic valve mean velocity, S           288    cm/s     ----------  Aortic valve VTI, S                     101    cm       ----------  Aortic mean gradient, S                 37     mm Hg    ----------  Aortic peak gradient, S                 66     mm Hg    ----------  VTI ratio, LVOT/AV  0.31            ----------  Aortic valve area, VTI                  0.88   cm^2     ----------  Aortic valve area/bsa, VTI              0.41   cm^2/m^2 ----------  Velocity ratio, peak, LVOT/AV           0.33            ----------  Aortic valve area, peak velocity        0.94   cm^2     ----------  Aortic valve area/bsa, peak             0.44   cm^2/m^2 ----------  velocity  Velocity ratio, mean, LVOT/AV           0.33            ----------  Aortic valve area, mean velocity        0.93   cm^2     ----------  Aortic valve area/bsa, mean             0.44   cm^2/m^2 ----------  velocity    Aorta                                   Value           Reference  Aortic root ID, ED                      29     mm       ----------    Left atrium                             Value           Reference  LA ID, A-P, ES                          39     mm       ----------  LA ID/bsa, A-P                           1.83   cm/m^2   <=2.2  LA volume, S                            65.1   ml       ----------  LA volume/bsa, S                        30.5   ml/m^2   ----------  LA volume, ES, 1-p A4C                  58.8   ml       ----------  LA volume/bsa, ES, 1-p A4C              27.6   ml/m^2   ----------  LA volume, ES, 1-p A2C                  70     ml       ----------  LA volume/bsa, ES, 1-p A2C              32.8   ml/m^2   ----------    Mitral valve                            Value           Reference  Mitral E-wave peak velocity             86.3   cm/s     ----------  Mitral A-wave peak velocity             96     cm/s     ----------  Mitral deceleration time         (H)    511    ms       150 - 230  Mitral peak gradient, D                 3      mm Hg    ----------  Mitral E/A ratio, peak                  0.9             ----------    Pulmonary arteries                      Value           Reference  PA pressure, S, DP                      28     mm Hg    <=30    Tricuspid valve                         Value           Reference  Tricuspid regurg peak velocity          251    cm/s     ----------  Tricuspid peak RV-RA gradient           25     mm Hg    ----------  Tricuspid maximal regurg                251    cm/s     ----------  velocity, PISA    Right atrium                            Value           Reference  RA ID, S-I, ES, A4C                     48.2   mm       34 - 49  RA area, ES, A4C                        13.1   cm^2     8.3 - 19.5  RA volume, ES, A/L                      29.1   ml       ----------  RA volume/bsa, ES, A/L  13.6   ml/m^2   ----------    Systemic veins                          Value           Reference  Estimated CVP                           3      mm Hg    ----------    Right ventricle                         Value           Reference  TAPSE                                   28.7   mm       ----------  RV pressure, S, DP                       28     mm Hg    <=30  RV s&', lateral, S                       11.6   cm/s     ----------   EKG:  EKG is ordered today. The ekg ordered today demonstrates sinus brady, rate 51 bpm. T wave inversions diffusely. Unchanged.   Recent Labs: 02/03/2019: ALT 10; BUN 12; Creatinine, Ser 0.79; Hemoglobin 11.9; Platelets 116.0; Potassium 3.9; Sodium 140   Lipid Panel    Component Value Date/Time   CHOL 140 09/21/2018 1203   TRIG 130 09/21/2018 1203   HDL 45 (L) 09/21/2018 1203   CHOLHDL 3.1 09/21/2018 1203   VLDL 20 03/26/2017 0908   LDLCALC 74 09/21/2018 1203     Wt Readings from Last 3 Encounters:  02/04/19 215 lb (97.5 kg)  01/11/19 216 lb (98 kg)  10/28/18 214 lb (97.1 kg)     Other studies Reviewed: Additional studies/ records that were reviewed today include: . Review of the above records demonstrates:    Assessment and Plan:   1. Severe Aortic stenosis: We are planning for TAVR. She will need PCI prior to her TAVR.    2. CAD with unstable angina: Will plan PCI of the LAD and RCA at [facility] 02/11/19 at noon.  I have reviewed the risks, indications, and alternatives to cardiac catheterization with stenting with the patient. Risks include but are not limited to bleeding, infection, vascular injury, stroke, myocardial infection, arrhythmia, kidney injury, radiation-related injury in the case of prolonged fluoroscopy use, emergency cardiac surgery, and death. The patient understands the risks of serious complication is 1-2 in 1610 with diagnostic cardiac cath and 1-2% or less with angioplasty/stenting. -Will start Plavix today. She will take a load of 300 mg tonight then 75 mg daily.  -Start ASA 81 mg daily  3. Pulmonary nodule: Follow up CT chest post TAVR  4. Tobacco abuse: Smoking cessation is advised.   Current medicines are reviewed at length with the patient today.  The patient does not have concerns regarding medicines.  The following changes have been made:  no  change  Labs/ tests ordered today include:   Orders Placed This Encounter  Procedures   Basic metabolic panel  CBC with Differential/Platelet     Disposition:   FU with the TAVR team.   Signed, [name], MD 02/04/2019 3:30 PM    [address], [place], [place]  [id number] Phone: [phone]; Fax: [phone]

## 2019-02-04 NOTE — Progress Notes (Addendum)
Chief Complaint  Patient presents with   Follow-up    CAD   History of Present Illness: 68 yo female with history of CAD, DM, HTN, HLD, memory issues,  tobacco abuse, aortic stenosis, esophageal varices and hepatic cirrhosis who is here today for cardiac follow up. I saw her as a new patient in February 2019 for aortic valve stenosis. Echo December 2018 with normal LV systolic function. The aortic valve was thickened with restricted leaflet excursion, mean gradient 36 mmHg, peak gradient 59 mmHg, AVA 1.22 cm2, DVI 0.39. She had mild dyspnea with exertion with fatigue and mild dizziness at times. Her aortic stenosis was moderate by echo criteria. Echo 01/27/18 with normal LV systolic function, moderately severe AS with mean gradient of 37 mmHg, peak gradient 64 mmHg, AVA 1.17cm2, DVI 0.37. At her visit here in July 2019 she was feeling fatigued but she had also just been found to be anemic. Colonoscopy September 2019 with non bleeding AVMs and diverticular disease. Non-bleeding esophageal varices were seen on upper endoscopy. Echo 08/27/18 with LVEF=60-65%. There is moderately severe aortic stenosis with thickened and calcified valve leaflets. Mean gradient 37 mmHg, peak gradient 66 mmHg, Dimensionless index 0.31. AVA 0.88 cm2. I saw her in the office in February 2020 and she complained of more dyspnea with exertion and fatigue with dizziness. I arranged a cardiac catheterization on 10/07/18 and she was found to have severe three vessel CAD. Her gradients across the aortic valve by cath were consistent with severe AS. I referred her to CT surgery to consider CABG with AVR. She was seen by Dr. Roxy Manns 10/19/18 and not felt to be a good candidate for surgical AVR and CABG due to the small size of her aortic root. She is back today to discuss PCI prior to proceeding with TAVR. CTA chest/ abdomen/pelvis 10/14/18 showed adequate access for TAVR. There is a left lower lobe pulmonary nodule and non-specific nodular  appearing areas of ground glass attenuation in the upper lungs. (3 month f/u recommended). Cardiac CT showed adequate coronary heights with Annulus area of 317 mm2. Abdominal u/s ordered by GI (Dr. [name]) with findings consistent with hepatic cirrhosis. She is followed in the GI clinic with hepatic cirrhosis. She has undergone recent dental extraction pre TAVR.   She tells me today that she continues to have exertional dyspnea. She has no energy.     Primary Care Physician: [name], MD  Past Medical History:  Diagnosis Date   Acute encephalopathy 09/14/2016   Adenomatous polyp    Anxiety    Aortic stenosis    Arrhythmia    Chronic upper GI bleeding    intestinal AVM's, portal gastropathy, esophageal varices on egd 04/2018   Coronary artery disease involving native coronary artery without angina pectoris    Dementia ([place]) 09/16/2016   DIVERTICULITIS OF COLON 06/14/2008   Qualifier: Diagnosis of  By: Gretta Cool RN, Malachy Mood     DM (diabetes mellitus), type 2 ([place]) 09/15/2016   Esophageal varices determined by endoscopy ([place])    Fatty liver    Hyperlipidemia    Hypertension    Incidental pulmonary nodule, greater than or equal to 70m 10/14/2018   LLL nodule needs f/u CT or PET-CT scan   Irritable bowel syndrome 04/06/2008   Centricity Description: IBS Qualifier: Diagnosis of  By: [name] ([name], [name]   Centricity Description: IRRITABLE BOWEL SYNDROME Qualifier: Diagnosis of  By: [name], [name]   Liver cirrhosis secondary to NASH ([place] 09/16/2016  NASH (nonalcoholic steatohepatitis)    Obesity    Portal hypertensive gastropathy (HCC)    SOB (shortness of breath)    Splenomegaly 09/16/2016   Thrombocytopenia (HCC)    Thyroid disease    Vitamin B 12 deficiency 09/16/2016   Vitamin D deficiency     Past Surgical History:  Procedure Laterality Date   COLONOSCOPY WITH PROPOFOL N/A 04/09/2018   Procedure: COLONOSCOPY WITH PROPOFOL;  Surgeon: [name], MD;  Location: [facility];  Service: Endoscopy;  Laterality: N/A;   ESOPHAGOGASTRODUODENOSCOPY (EGD) WITH PROPOFOL N/A 04/09/2018   Procedure: ESOPHAGOGASTRODUODENOSCOPY (EGD) WITH PROPOFOL;  Surgeon: [name], MD;  Location: [facility];  Service: Endoscopy;  Laterality: N/A;   oopherectomy     OVARIAN CYST REMOVAL     POLYPECTOMY  04/09/2018   Procedure: POLYPECTOMY;  Surgeon: [name], MD;  Location: [facility];  Service: Endoscopy;;   RIGHT/LEFT HEART CATH AND CORONARY ANGIOGRAPHY N/A 10/07/2018   Procedure: RIGHT/LEFT HEART CATH AND CORONARY ANGIOGRAPHY;  Surgeon: [name], MD;  Location: [facility];  Service: Cardiovascular;  Laterality: N/A;    Current Outpatient Medications  Medication Sig Dispense Refill   ACCU-CHEK AVIVA PLUS test strip      ACCU-CHEK SOFTCLIX LANCETS lancets      amLODipine (NORVASC) 10 MG tablet Take 1 tablet (10 mg total) by mouth daily. 90 tablet 3   atorvastatin (LIPITOR) 20 MG tablet TAKE 1 TABLET (20 MG TOTAL) BY MOUTH DAILY. 90 tablet 3   Blood Glucose Monitoring Suppl (ACCU-CHEK AVIVA PLUS) w/Device KIT      erythromycin ophthalmic ointment Place 1 application into the left eye at bedtime. 3.5 g 0   ferrous sulfate 325 (65 FE) MG tablet Take 1 tablet (325 mg total) by mouth daily. 90 tablet 3   fluconazole (DIFLUCAN) 150 MG tablet Take 1 tablet (150 mg total) by mouth once for 1 dose. 1 tablet 0   furosemide (LASIX) 20 MG tablet TAKE 1 TABLET EVERY DAY  (DISCONTINUE FUROSEMIDE 40MG TWICE DAILY) 90 tablet 0   levothyroxine (SYNTHROID, LEVOTHROID) 75 MCG tablet TAKE 1 TABLET EVERY DAY (Patient taking differently: Take 75 mcg by mouth daily. ) 90 tablet 3   LORazepam (ATIVAN) 0.5 MG tablet TAKE 1/2 TABLET BY MOUTH EVERY 8 HOURS AS NEEDED 10 tablet 0   losartan (COZAAR) 100 MG tablet Take 1 tablet (100 mg total) by mouth daily. 90 tablet 0   methylcellulose (CITRUCEL) oral powder Take 1 packet  by mouth daily as needed (constipation).     pantoprazole (PROTONIX) 40 MG tablet Take 1 tablet (40 mg total) by mouth daily. 90 tablet 3   propranolol (INDERAL) 40 MG tablet Take 1 tablet (40 mg total) by mouth 2 (two) times daily. 180 tablet 0   zolpidem (AMBIEN) 5 MG tablet Take 1 tablet (5 mg total) by mouth at bedtime as needed for sleep. Stop ativan 30 tablet 1   aspirin EC 81 MG tablet Take 1 tablet (81 mg total) by mouth daily. 90 tablet 3   clopidogrel (PLAVIX) 75 MG tablet Take 1 tablet (75 mg total) by mouth daily. 90 tablet 3   potassium chloride SA (K-DUR) 20 MEQ tablet Take 1 tablet (20 mEq total) by mouth daily. 90 tablet 3   Current Facility-Administered Medications  Medication Dose Route Frequency Provider Last Rate Last Dose   cyanocobalamin ((VITAMIN B-12)) injection 1,000 mcg  1,000 mcg Subcutaneous Q30 days [name], MD   1,000 mcg  at 09/08/17 1408    Allergies  Allergen Reactions   Celecoxib     Unknown reaction   Prednisone Itching    Heart rate increased   Macrodantin [Nitrofurantoin Macrocrystal] Rash   Penicillins Hives    Did it involve swelling of the face/tongue/throat, SOB, or low BP? Unknown Did it involve sudden or severe rash/hives, skin peeling, or any reaction on the inside of your mouth or nose? Unknown Did you need to seek medical attention at a hospital or doctor's office? Unknown When did it last happen?20 + years If all above answers are NO, [place] proceed with cephalosporin use.     Social History   Socioeconomic History   Marital status: Married    Spouse name: Not on file   Number of children: 1   Years of education: Not on file   Highest education level: Not on file  Occupational History   Not on file  Social Needs   Financial resource strain: Not on file   Food insecurity    Worry: Not on file    Inability: Not on file   Transportation needs    Medical: Not on file    Non-medical: Not on file    Tobacco Use   Smoking status: Current Every Day Smoker    Packs/day: 0.50    Types: Cigarettes   Smokeless tobacco: Never Used  Substance and Sexual Activity   Alcohol use: No   Drug use: Not Currently    Types: Benzodiazepines   Sexual activity: Not Currently  Lifestyle   Physical activity    Days per week: Not on file    Minutes per session: Not on file   Stress: Not on file  Relationships   Social connections    Talks on phone: Not on file    Gets together: Not on file    Attends religious service: Not on file    Active member of club or organization: Not on file    Attends meetings of clubs or organizations: Not on file    Relationship status: Not on file   Intimate partner violence    Fear of current or ex partner: Not on file    Emotionally abused: Not on file    Physically abused: Not on file    Forced sexual activity: Not on file  Other Topics Concern   Not on file  Social History Narrative   Not on file    Family History  Problem Relation Age of Onset   Lung cancer Father    Heart disease Father        MI at age 61   Alcohol abuse Father    Alzheimer's disease Mother    Dementia Mother    Other Mother        twisted colon    Stomach cancer Neg Hx    Colon cancer Neg Hx     Review of Systems:  As stated in the HPI and otherwise negative.   BP 120/60    Pulse (!) 51    Ht 5' 4"  (1.626 m)    Wt 215 lb (97.5 kg)    SpO2 96%    BMI 36.90 kg/m   Physical Examination:  General: Well developed, well nourished, NAD  HEENT: OP clear, mucus membranes moist  SKIN: warm, dry. No rashes. Neuro: No focal deficits  Musculoskeletal: Muscle strength 5/5 all ext  Psychiatric: Mood and affect normal  Neck: No JVD, no carotid bruits, no thyromegaly, no lymphadenopathy.  Lungs:Clear bilaterally, no wheezes, rhonci, crackles Cardiovascular: Regular rate and rhythm. Loud, harsh systolic murmur noted.  Abdomen:Soft. Bowel sounds present.  Non-tender.  Extremities: No lower extremity edema. Pulses are 2 + in the bilateral DP/PT.  Echo 08/27/18: - Left ventricle: The cavity size was normal. There was moderate   concentric hypertrophy. Systolic function was normal. The   estimated ejection fraction was in the range of 60% to 65%. Wall   motion was normal; there were no regional wall motion   abnormalities. Doppler parameters are consistent with abnormal   left ventricular relaxation (grade 1 diastolic dysfunction).   Doppler parameters are consistent with elevated mean left atrial   filling pressure. - Aortic valve: Valve mobility was restricted. There was moderate   to severe stenosis. Valve area (VTI): 0.88 cm^2. Valve area   (Vmax): 0.94 cm^2. Valve area (Vmean): 0.93 cm^2. - Left atrium: The atrium was mildly dilated. - Tricuspid valve: There was mild-moderate regurgitation.  Impressions:  - No change since June 2019.  ------------------------------------------------------------------- Study data:  Comparison was made to the study of 01/27/2018.  Study status:  Routine.  Procedure:  The patient reported no pain pre or post test. Transthoracic echocardiography. Image quality was adequate.          Transthoracic echocardiography.  M-mode, complete 2D, spectral Doppler, and color Doppler.  Birthdate: Patient birthdate: 09-Upham-1952.  Age:  Patient is 68 yr old.  Sex: Gender: female.    BMI: 35.8 kg/m^2.  Blood pressure:     156/75 Patient status:  Outpatient.  Study date:  Study date: 08/27/2018. Study time: 11:42 AM.  Location:  [facility]  -------------------------------------------------------------------  ------------------------------------------------------------------- Left ventricle:  The cavity size was normal. There was moderate concentric hypertrophy. Systolic function was normal. The estimated ejection fraction was in the range of 60% to 65%. Wall motion was normal; there were no regional wall  motion abnormalities. Doppler parameters are consistent with abnormal left ventricular relaxation (grade 1 diastolic dysfunction). Doppler parameters are consistent with elevated mean left atrial filling pressure.  ------------------------------------------------------------------- Aortic valve:   Probably trileaflet; moderately thickened, moderately calcified leaflets. Valve mobility was restricted. Doppler:   There was moderate to severe stenosis.   There was no significant regurgitation.    VTI ratio of LVOT to aortic valve: 0.31. Valve area (VTI): 0.88 cm^2. Indexed valve area (VTI): 0.41 cm^2/m^2. Peak velocity ratio of LVOT to aortic valve: 0.33. Valve area (Vmax): 0.94 cm^2. Indexed valve area (Vmax): 0.44 cm^2/m^2. Mean velocity ratio of LVOT to aortic valve: 0.33. Valve area (Vmean): 0.93 cm^2. Indexed valve area (Vmean): 0.44 cm^2/m^2. Mean gradient (S): 37 mm Hg. Peak gradient (S): 66 mm Hg.  ------------------------------------------------------------------- Aorta:  Aortic root: The aortic root was normal in size. Ascending aorta: The ascending aorta was normal in size.  ------------------------------------------------------------------- Mitral valve:   Mildly thickened leaflets .  Doppler: Transvalvular velocity was within the normal range. There was no evidence for stenosis. There was no significant regurgitation. Peak gradient (D): 3 mm Hg.  ------------------------------------------------------------------- Left atrium:  The atrium was mildly dilated.  ------------------------------------------------------------------- Right ventricle:  The cavity size was normal. Systolic function was normal.  ------------------------------------------------------------------- Pulmonic valve:   Poorly visualized.  The valve appears to be grossly normal.    Doppler:  There was mild  regurgitation.  ------------------------------------------------------------------- Tricuspid valve:   Structurally normal valve.   Leaflet separation was normal.  Doppler:  Transvalvular velocity was within the normal range. There was mild-moderate regurgitation.  ------------------------------------------------------------------- Pulmonary artery:  Systolic pressure was within the normal range.   ------------------------------------------------------------------- Right atrium:  The atrium was normal in size.  ------------------------------------------------------------------- Pericardium:  There was no pericardial effusion.  ------------------------------------------------------------------- Measurements   Left ventricle                          Value           Reference  LV ID, ED, PLAX chordal                 44.6   mm       43 - 52  LV ID, ES, PLAX chordal                 27.8   mm       23 - 38  LV fx shortening, PLAX chordal          38     %        >=29  LV PW thickness, ED                     15.7   mm       ----------  IVS/LV PW ratio, ED                     0.84            <=1.3  Stroke volume, 2D                       97     ml       ----------  Stroke volume/bsa, 2D                   45     ml/m^2   ----------  LV e&', lateral                          5.22   cm/s     ----------  LV E/e&', lateral                        16.53           ----------  LV e&', medial                           3.37   cm/s     ----------  LV E/e&', medial                         25.61           ----------  LV e&', average                          4.3    cm/s     ----------  LV E/e&', average                        20.09           ----------  Longitudinal strain, TDI                19     %        ----------    Ventricular septum  Value           Reference  IVS thickness, ED                       13.26  mm       ----------    LVOT                                     Value           Reference  LVOT ID, S                              19     mm       ----------  LVOT area                               2.84   cm^2     ----------  LVOT peak velocity, S                   122.26 cm/s     ----------  LVOT mean velocity, S                   94.7   cm/s     ----------  LVOT VTI, S                             29.84  cm       ----------  LVOT peak gradient, S                   6      mm Hg    ----------  Stroke volume (SV), LVOT DP             84.6   ml       ----------  Stroke index (SV/bsa), LVOT DP          39.6   ml/m^2   ----------    Aortic valve                            Value           Reference  Aortic valve peak velocity, S           405    cm/s     ----------  Aortic valve mean velocity, S           288    cm/s     ----------  Aortic valve VTI, S                     101    cm       ----------  Aortic mean gradient, S                 37     mm Hg    ----------  Aortic peak gradient, S                 66     mm Hg    ----------  VTI ratio, LVOT/AV  0.31            ----------  Aortic valve area, VTI                  0.88   cm^2     ----------  Aortic valve area/bsa, VTI              0.41   cm^2/m^2 ----------  Velocity ratio, peak, LVOT/AV           0.33            ----------  Aortic valve area, peak velocity        0.94   cm^2     ----------  Aortic valve area/bsa, peak             0.44   cm^2/m^2 ----------  velocity  Velocity ratio, mean, LVOT/AV           0.33            ----------  Aortic valve area, mean velocity        0.93   cm^2     ----------  Aortic valve area/bsa, mean             0.44   cm^2/m^2 ----------  velocity    Aorta                                   Value           Reference  Aortic root ID, ED                      29     mm       ----------    Left atrium                             Value           Reference  LA ID, A-P, ES                          39     mm       ----------  LA ID/bsa, A-P                           1.83   cm/m^2   <=2.2  LA volume, S                            65.1   ml       ----------  LA volume/bsa, S                        30.5   ml/m^2   ----------  LA volume, ES, 1-p A4C                  58.8   ml       ----------  LA volume/bsa, ES, 1-p A4C              27.6   ml/m^2   ----------  LA volume, ES, 1-p A2C                  70     ml       ----------  LA volume/bsa, ES, 1-p A2C              32.8   ml/m^2   ----------    Mitral valve                            Value           Reference  Mitral E-wave peak velocity             86.3   cm/s     ----------  Mitral A-wave peak velocity             96     cm/s     ----------  Mitral deceleration time         (H)    511    ms       150 - 230  Mitral peak gradient, D                 3      mm Hg    ----------  Mitral E/A ratio, peak                  0.9             ----------    Pulmonary arteries                      Value           Reference  PA pressure, S, DP                      28     mm Hg    <=30    Tricuspid valve                         Value           Reference  Tricuspid regurg peak velocity          251    cm/s     ----------  Tricuspid peak RV-RA gradient           25     mm Hg    ----------  Tricuspid maximal regurg                251    cm/s     ----------  velocity, PISA    Right atrium                            Value           Reference  RA ID, S-I, ES, A4C                     48.2   mm       34 - 49  RA area, ES, A4C                        13.1   cm^2     8.3 - 19.5  RA volume, ES, A/L                      29.1   ml       ----------  RA volume/bsa, ES, A/L  13.6   ml/m^2   ----------    Systemic veins                          Value           Reference  Estimated CVP                           3      mm Hg    ----------    Right ventricle                         Value           Reference  TAPSE                                   28.7   mm       ----------  RV pressure, S, DP                       28     mm Hg    <=30  RV s&', lateral, S                       11.6   cm/s     ----------   EKG:  EKG is ordered today. The ekg ordered today demonstrates sinus brady, rate 51 bpm. T wave inversions diffusely. Unchanged.   Recent Labs: 02/03/2019: ALT 10; BUN 12; Creatinine, Ser 0.79; Hemoglobin 11.9; Platelets 116.0; Potassium 3.9; Sodium 140   Lipid Panel    Component Value Date/Time   CHOL 140 09/21/2018 1203   TRIG 130 09/21/2018 1203   HDL 45 (L) 09/21/2018 1203   CHOLHDL 3.1 09/21/2018 1203   VLDL 20 03/26/2017 0908   LDLCALC 74 09/21/2018 1203     Wt Readings from Last 3 Encounters:  02/04/19 215 lb (97.5 kg)  01/11/19 216 lb (98 kg)  10/28/18 214 lb (97.1 kg)     Other studies Reviewed: Additional studies/ records that were reviewed today include: . Review of the above records demonstrates:    STS SCORE Procedure: AVR + CAB  Risk of Mortality:  6.442%   Renal Failure:  7.740%   Permanent Stroke:  2.254%   Prolonged Ventilation:  29.788%   DSW Infection:  0.418%   Reoperation:  7.464%   Morbidity or Mortality:  35.432%   Short Length of Stay:  12.752%   Long Length of Stay:  23.355%    Assessment and Plan:   1. Severe Aortic stenosis: We are planning for TAVR. She will need PCI prior to her TAVR.    2. CAD with unstable angina: Will plan PCI of the LAD and RCA at [facility] 02/11/19 at noon.  I have reviewed the risks, indications, and alternatives to cardiac catheterization with stenting with the patient. Risks include but are not limited to bleeding, infection, vascular injury, stroke, myocardial infection, arrhythmia, kidney injury, radiation-related injury in the case of prolonged fluoroscopy use, emergency cardiac surgery, and death. The patient understands the risks of serious complication is 1-2 in 2563 with diagnostic cardiac cath and 1-2% or less with angioplasty/stenting. -Will start Plavix today. She will take a load of 300 mg tonight then 75 mg daily.   -Start ASA 81 mg daily  3. Pulmonary nodule: Follow up CT chest post TAVR  4. Tobacco abuse: Smoking cessation is advised.   Current medicines are reviewed at length with the patient today.  The patient does not have concerns regarding medicines.  The following changes have been made:  no change  Labs/ tests ordered today include:   Orders Placed This Encounter  Procedures   Basic metabolic panel   CBC with Differential/Platelet     Disposition:   FU with the TAVR team.   Signed, [name], MD 02/04/2019 3:30 PM    [address], [place], [place]  [address] Phone: [phone]; Fax: [phone]

## 2019-02-05 LAB — BASIC METABOLIC PANEL
BUN/Creatinine Ratio: 12 (ref 12–28)
BUN: 11 mg/dL (ref 8–27)
CO2: 23 mmol/L (ref 20–29)
Calcium: 9 mg/dL (ref 8.7–10.3)
Chloride: 103 mmol/L (ref 96–106)
Creatinine, Ser: 0.92 mg/dL (ref 0.57–1.00)
GFR calc Af Amer: 73 mL/min/{1.73_m2} (ref >59)
GFR calc non Af Amer: 63 mL/min/{1.73_m2} (ref >59)
Glucose: 360 mg/dL — ABNORMAL HIGH (ref 65–99)
Potassium: 4.3 mmol/L (ref 3.5–5.2)
Sodium: 138 mmol/L (ref 134–144)

## 2019-02-05 LAB — CBC WITH DIFFERENTIAL/PLATELET
Basophils Absolute: 0.1 10*3/uL (ref 0.0–0.2)
Basos: 1 %
EOS (ABSOLUTE): 0.2 10*3/uL (ref 0.0–0.4)
Eos: 3 %
Hematocrit: 34.8 % (ref 34.0–46.6)
Hemoglobin: 11.3 g/dL (ref 11.1–15.9)
Immature Grans (Abs): 0 10*3/uL (ref 0.0–0.1)
Immature Granulocytes: 0 %
Lymphocytes Absolute: 2.3 10*3/uL (ref 0.7–3.1)
Lymphs: 26 %
MCH: 27 pg (ref 26.6–33.0)
MCHC: 32.5 g/dL (ref 31.5–35.7)
MCV: 83 fL (ref 79–97)
Monocytes Absolute: 0.9 10*3/uL (ref 0.1–0.9)
Monocytes: 10 %
Neutrophils Absolute: 5.4 10*3/uL (ref 1.4–7.0)
Neutrophils: 60 %
Platelets: 133 10*3/uL — ABNORMAL LOW (ref 150–450)
RBC: 4.18 x10E6/uL (ref 3.77–5.28)
RDW: 13.8 % (ref 11.7–15.4)
WBC: 9 10*3/uL (ref 3.4–10.8)

## 2019-02-08 ENCOUNTER — Other Ambulatory Visit (HOSPITAL_COMMUNITY)
Admission: RE | Admit: 2019-02-08 | Discharge: 2019-02-08 | Disposition: A | Discharge status: Home / Self Care | Payer: Medicare HMO | Source: Ambulatory Visit | Attending: Cardiovascular Disease | Admitting: Cardiovascular Disease

## 2019-02-08 DIAGNOSIS — Z1159 Encounter for screening for other viral diseases: Secondary | ICD-10-CM | POA: Insufficient documentation

## 2019-02-08 DIAGNOSIS — Z01812 Encounter for preprocedural laboratory examination: Secondary | ICD-10-CM | POA: Insufficient documentation

## 2019-02-09 ENCOUNTER — Telehealth: Payer: Self-pay

## 2019-02-09 LAB — SARS CORONAVIRUS 2 (TAT 6-24 HRS): SARS Coronavirus 2: NEGATIVE

## 2019-02-09 NOTE — Telephone Encounter (Signed)
Attempted to call the patient to review the following pre-procedure instructions. Patient did not answer the phone and the mailbox on the phone was full so I was unable to leave a message. Will attempt to call the patient tomorrow.  Pt contacted pre-catheterization scheduled at [facility] for: 02/11/19 at 12:00 noon  Verified arrival time and place: [facility] Main Entrance A at:10:00 am  Covid-19 test date:02/08/19  No solid food after midnight prior to cath, clear liquids until 5 AM day of procedure.  Contrast allergy:No  Verified no diabetes medications. Patient is taking diabetic medications  AM meds can be  taken pre-cath with sip of water including: ASA 81 mg  Patient is instructed to take her Plavix the morning of the procedure prior to arrival  Patient is instructed to hold all of her diabetic medications the morning of the procedure  Confirmed patient has responsible person to drive home post procedure and observe 24 hours after arriving home: Patient has a responsible person to drive them home post procedure and observe them for 24 hours after arriving home.

## 2019-02-10 ENCOUNTER — Telehealth: Payer: Self-pay

## 2019-02-10 NOTE — Telephone Encounter (Signed)
Reviewed the following pre-procedure instructions with the patient. Patient verbalized understanding of the instructions and has no questions at this time.   Pt contacted pre-catheterization scheduled at [facility] for: 02/11/19 at 12:00 noon  Verified arrival time and place: [facility] Main Entrance A at:10:00 am  Covid-19 test date:02/08/19  No solid food after midnight prior to cath, clear liquids until 5 AM day of procedure.  Contrast allergy:No  Verified no diabetes medications. Patient is taking diabetic medications  AM meds can be  taken pre-cath with sip of water including: ASA 81 mg  Patient is instructed to take her Plavix the morning of the procedure prior to arrival  Patient is instructed to hold all of her diabetic medications the morning of the procedure. Patient stated that she is not taking diabetic medications right now.  Patient is instructed to hold her lasix the morning of the procedure.  Confirmed patient has responsible person to drive home post procedure and observe 24 hours after arriving home: Patient has a responsible person to drive them home post procedure and observe them for 24 hours after arriving home.      COVID-19 Pre-Screening Questions:  . In the past 7 to 10 days have you had a cough,  shortness of breath, headache, congestion, fever (100 or greater) body aches, chills, sore throat, or sudden loss of taste or sense of smell? No . Have you been around anyone with known Covid 19. No . Have you been around anyone who is awaiting Covid 19 test results in the past 7 to 10 days? No . Have you been around anyone who has been exposed to Covid 19, or has mentioned symptoms of Covid 19 within the past 7 to 10 days? No  If you have any concerns/questions about symptoms patients report during screening (either on the phone or at threshold). Contact the provider seeing the patient or DOD for further guidance.  If neither are available contact a  member of the leadership team.

## 2019-02-11 ENCOUNTER — Encounter (HOSPITAL_COMMUNITY): Payer: Self-pay | Admitting: Cardiovascular Disease

## 2019-02-11 ENCOUNTER — Other Ambulatory Visit: Payer: Self-pay

## 2019-02-11 ENCOUNTER — Ambulatory Visit (HOSPITAL_BASED_OUTPATIENT_CLINIC_OR_DEPARTMENT_OTHER): Payer: Medicare HMO

## 2019-02-11 ENCOUNTER — Ambulatory Visit (HOSPITAL_COMMUNITY)
Admission: RE | Disposition: A | Discharge status: Home / Self Care | Payer: Self-pay | Source: Home / Self Care | Attending: Cardiovascular Disease

## 2019-02-11 ENCOUNTER — Ambulatory Visit (HOSPITAL_COMMUNITY)
Admission: RE | Admit: 2019-02-11 | Discharge: 2019-02-12 | Disposition: A | Discharge status: Home / Self Care | Payer: Medicare HMO | Attending: Cardiovascular Disease | Admitting: Cardiovascular Disease

## 2019-02-11 DIAGNOSIS — E119 Type 2 diabetes mellitus without complications: Secondary | ICD-10-CM

## 2019-02-11 DIAGNOSIS — I200 Unstable angina: Secondary | ICD-10-CM

## 2019-02-11 DIAGNOSIS — Z7902 Long term (current) use of antithrombotics/antiplatelets: Secondary | ICD-10-CM | POA: Diagnosis not present

## 2019-02-11 DIAGNOSIS — F0390 Unspecified dementia without behavioral disturbance: Secondary | ICD-10-CM | POA: Insufficient documentation

## 2019-02-11 DIAGNOSIS — K7460 Unspecified cirrhosis of liver: Secondary | ICD-10-CM | POA: Diagnosis not present

## 2019-02-11 DIAGNOSIS — F419 Anxiety disorder, unspecified: Secondary | ICD-10-CM | POA: Diagnosis not present

## 2019-02-11 DIAGNOSIS — Z880 Allergy status to penicillin: Secondary | ICD-10-CM | POA: Diagnosis not present

## 2019-02-11 DIAGNOSIS — E785 Hyperlipidemia, unspecified: Secondary | ICD-10-CM | POA: Diagnosis not present

## 2019-02-11 DIAGNOSIS — E669 Obesity, unspecified: Secondary | ICD-10-CM | POA: Insufficient documentation

## 2019-02-11 DIAGNOSIS — Z6836 Body mass index (BMI) 36.0-36.9, adult: Secondary | ICD-10-CM | POA: Insufficient documentation

## 2019-02-11 DIAGNOSIS — E559 Vitamin D deficiency, unspecified: Secondary | ICD-10-CM | POA: Insufficient documentation

## 2019-02-11 DIAGNOSIS — E079 Disorder of thyroid, unspecified: Secondary | ICD-10-CM | POA: Insufficient documentation

## 2019-02-11 DIAGNOSIS — I8510 Secondary esophageal varices without bleeding: Secondary | ICD-10-CM | POA: Insufficient documentation

## 2019-02-11 DIAGNOSIS — K7581 Nonalcoholic steatohepatitis (NASH): Secondary | ICD-10-CM | POA: Insufficient documentation

## 2019-02-11 DIAGNOSIS — Z79899 Other long term (current) drug therapy: Secondary | ICD-10-CM | POA: Diagnosis not present

## 2019-02-11 DIAGNOSIS — Z888 Allergy status to other drugs, medicaments and biological substances status: Secondary | ICD-10-CM | POA: Insufficient documentation

## 2019-02-11 DIAGNOSIS — Z01810 Encounter for preprocedural cardiovascular examination: Secondary | ICD-10-CM | POA: Diagnosis not present

## 2019-02-11 DIAGNOSIS — Z791 Long term (current) use of non-steroidal anti-inflammatories (NSAID): Secondary | ICD-10-CM | POA: Diagnosis not present

## 2019-02-11 DIAGNOSIS — Z79890 Hormone replacement therapy (postmenopausal): Secondary | ICD-10-CM | POA: Insufficient documentation

## 2019-02-11 DIAGNOSIS — Z7982 Long term (current) use of aspirin: Secondary | ICD-10-CM | POA: Insufficient documentation

## 2019-02-11 DIAGNOSIS — I25110 Atherosclerotic heart disease of native coronary artery with unstable angina pectoris: Secondary | ICD-10-CM

## 2019-02-11 DIAGNOSIS — Z955 Presence of coronary angioplasty implant and graft: Secondary | ICD-10-CM | POA: Diagnosis not present

## 2019-02-11 DIAGNOSIS — I10 Essential (primary) hypertension: Secondary | ICD-10-CM | POA: Diagnosis present

## 2019-02-11 DIAGNOSIS — F17210 Nicotine dependence, cigarettes, uncomplicated: Secondary | ICD-10-CM | POA: Diagnosis not present

## 2019-02-11 DIAGNOSIS — I350 Nonrheumatic aortic (valve) stenosis: Secondary | ICD-10-CM | POA: Diagnosis not present

## 2019-02-11 DIAGNOSIS — I2510 Atherosclerotic heart disease of native coronary artery without angina pectoris: Secondary | ICD-10-CM | POA: Diagnosis present

## 2019-02-11 HISTORY — PX: CORONARY STENT INTERVENTION: CATH118234

## 2019-02-11 LAB — GLUCOSE, CAPILLARY
Glucose-Capillary: 290 mg/dL — ABNORMAL HIGH (ref 70–99)
Glucose-Capillary: 247 mg/dL — ABNORMAL HIGH (ref 70–99)
Glucose-Capillary: 256 mg/dL — ABNORMAL HIGH (ref 70–99)
Glucose-Capillary: 191 mg/dL — ABNORMAL HIGH (ref 70–99)

## 2019-02-11 LAB — POCT ACTIVATED CLOTTING TIME
Activated Clotting Time: 384 seconds
Activated Clotting Time: 483 seconds

## 2019-02-11 SURGERY — CORONARY STENT INTERVENTION
Anesthesia: LOCAL

## 2019-02-11 MED ORDER — PANTOPRAZOLE SODIUM 40 MG PO TBEC
40.0000 mg | DELAYED_RELEASE_TABLET | Freq: Every day | ORAL | Status: DC
  Administered 2019-02-12: 40 mg via ORAL
  Filled 2019-02-11: qty 1

## 2019-02-11 MED ORDER — NITROGLYCERIN 1 MG/10 ML FOR IR/CATH LAB
INTRA_ARTERIAL | Status: AC
  Filled 2019-02-11: qty 10

## 2019-02-11 MED ORDER — PROPRANOLOL HCL 40 MG PO TABS
40.0000 mg | ORAL_TABLET | Freq: Two times a day (BID) | ORAL | Status: DC
  Administered 2019-02-12: 40 mg via ORAL
  Filled 2019-02-11 (×3): qty 1

## 2019-02-11 MED ORDER — LIDOCAINE HCL (PF) 1 % IJ SOLN
INTRAMUSCULAR | Status: AC
  Filled 2019-02-11: qty 30

## 2019-02-11 MED ORDER — SODIUM CHLORIDE 0.9 % IV SOLN: 250.0000 mL | INTRAVENOUS | Status: DC | PRN

## 2019-02-11 MED ORDER — LOSARTAN POTASSIUM 50 MG PO TABS
100.0000 mg | ORAL_TABLET | Freq: Every day | ORAL | Status: DC
  Administered 2019-02-12: 100 mg via ORAL
  Filled 2019-02-11: qty 2

## 2019-02-11 MED ORDER — MIDAZOLAM HCL 2 MG/2ML IJ SOLN
INTRAMUSCULAR | Status: AC
  Filled 2019-02-11: qty 2

## 2019-02-11 MED ORDER — LORAZEPAM 1 MG PO TABS: 1.0000 mg | ORAL_TABLET | ORAL | Status: DC | PRN

## 2019-02-11 MED ORDER — ONDANSETRON HCL 4 MG/2ML IJ SOLN: 4.0000 mg | Freq: Four times a day (QID) | INTRAMUSCULAR | Status: DC | PRN

## 2019-02-11 MED ORDER — FERROUS SULFATE 325 (65 FE) MG PO TABS
325.0000 mg | ORAL_TABLET | Freq: Every day | ORAL | Status: DC
  Administered 2019-02-12: 325 mg via ORAL
  Filled 2019-02-11: qty 1

## 2019-02-11 MED ORDER — SODIUM CHLORIDE 0.9% FLUSH
3.0000 mL | Freq: Two times a day (BID) | INTRAVENOUS | Status: DC
  Administered 2019-02-12: 3 mL via INTRAVENOUS

## 2019-02-11 MED ORDER — FENTANYL CITRATE (PF) 100 MCG/2ML IJ SOLN
INTRAMUSCULAR | Status: DC | PRN
  Administered 2019-02-11: 25 ug via INTRAVENOUS

## 2019-02-11 MED ORDER — ASPIRIN EC 81 MG PO TBEC
81.0000 mg | DELAYED_RELEASE_TABLET | Freq: Every day | ORAL | Status: DC
  Administered 2019-02-12: 81 mg via ORAL
  Filled 2019-02-11: qty 1

## 2019-02-11 MED ORDER — SODIUM CHLORIDE 0.9 % WEIGHT BASED INFUSION
3.0000 mL/kg/h | INTRAVENOUS | Status: DC
  Administered 2019-02-11: 3 mL/kg/h via INTRAVENOUS

## 2019-02-11 MED ORDER — HEPARIN (PORCINE) IN NACL 1000-0.9 UT/500ML-% IV SOLN
INTRAVENOUS | Status: AC
  Filled 2019-02-11: qty 1500

## 2019-02-11 MED ORDER — HEPARIN (PORCINE) IN NACL 1000-0.9 UT/500ML-% IV SOLN
INTRAVENOUS | Status: DC | PRN
  Administered 2019-02-11: 500 mL

## 2019-02-11 MED ORDER — LABETALOL HCL 5 MG/ML IV SOLN: 10.0000 mg | INTRAVENOUS | Status: AC | PRN

## 2019-02-11 MED ORDER — SODIUM CHLORIDE 0.9% FLUSH: 3.0000 mL | INTRAVENOUS | Status: DC | PRN

## 2019-02-11 MED ORDER — CLOPIDOGREL BISULFATE 75 MG PO TABS
75.0000 mg | ORAL_TABLET | Freq: Every day | ORAL | Status: DC
  Administered 2019-02-12: 75 mg via ORAL
  Filled 2019-02-11: qty 1

## 2019-02-11 MED ORDER — ATORVASTATIN CALCIUM 10 MG PO TABS: 20.0000 mg | ORAL_TABLET | Freq: Every day | ORAL | Status: DC

## 2019-02-11 MED ORDER — SODIUM CHLORIDE 0.9 % IV SOLN: INTRAVENOUS | Status: AC

## 2019-02-11 MED ORDER — SODIUM CHLORIDE 0.9 % WEIGHT BASED INFUSION: 1.0000 mL/kg/h | INTRAVENOUS | Status: DC

## 2019-02-11 MED ORDER — ZOLPIDEM TARTRATE 5 MG PO TABS
5.0000 mg | ORAL_TABLET | Freq: Every evening | ORAL | Status: DC | PRN
  Administered 2019-02-11: 22:00:00 5 mg via ORAL
  Filled 2019-02-11: qty 1

## 2019-02-11 MED ORDER — VERAPAMIL HCL 2.5 MG/ML IV SOLN
INTRAVENOUS | Status: AC
  Filled 2019-02-11: qty 2

## 2019-02-11 MED ORDER — VERAPAMIL HCL 2.5 MG/ML IV SOLN
INTRAVENOUS | Status: DC | PRN
  Administered 2019-02-11: 10 mL via INTRA_ARTERIAL

## 2019-02-11 MED ORDER — HEPARIN SODIUM (PORCINE) 1000 UNIT/ML IJ SOLN
INTRAMUSCULAR | Status: DC | PRN
  Administered 2019-02-11: 10000 [IU] via INTRAVENOUS

## 2019-02-11 MED ORDER — ASPIRIN 81 MG PO CHEW: 81.0000 mg | CHEWABLE_TABLET | ORAL | Status: DC

## 2019-02-11 MED ORDER — IOHEXOL 350 MG/ML SOLN
INTRAVENOUS | Status: DC | PRN
  Administered 2019-02-11: 155 mL via INTRA_ARTERIAL

## 2019-02-11 MED ORDER — AMLODIPINE BESYLATE 10 MG PO TABS
10.0000 mg | ORAL_TABLET | Freq: Every day | ORAL | Status: DC
  Administered 2019-02-12: 10 mg via ORAL
  Filled 2019-02-11: qty 1

## 2019-02-11 MED ORDER — ACETAMINOPHEN 325 MG PO TABS
650.0000 mg | ORAL_TABLET | ORAL | Status: DC | PRN
  Administered 2019-02-11: 650 mg via ORAL
  Filled 2019-02-11: qty 2

## 2019-02-11 MED ORDER — HYDRALAZINE HCL 20 MG/ML IJ SOLN: 10.0000 mg | INTRAMUSCULAR | Status: AC | PRN

## 2019-02-11 MED ORDER — HEPARIN SODIUM (PORCINE) 1000 UNIT/ML IJ SOLN
INTRAMUSCULAR | Status: AC
  Filled 2019-02-11: qty 1

## 2019-02-11 MED ORDER — MIDAZOLAM HCL 2 MG/2ML IJ SOLN
INTRAMUSCULAR | Status: DC | PRN
  Administered 2019-02-11: 1 mg via INTRAVENOUS

## 2019-02-11 MED ORDER — FENTANYL CITRATE (PF) 100 MCG/2ML IJ SOLN
INTRAMUSCULAR | Status: AC
  Filled 2019-02-11: qty 2

## 2019-02-11 MED ORDER — LEVOTHYROXINE SODIUM 75 MCG PO TABS
75.0000 ug | ORAL_TABLET | Freq: Every day | ORAL | Status: DC
  Administered 2019-02-12: 75 ug via ORAL
  Filled 2019-02-11: qty 1

## 2019-02-11 MED ORDER — SODIUM CHLORIDE 0.9% FLUSH: 3.0000 mL | Freq: Two times a day (BID) | INTRAVENOUS | Status: DC

## 2019-02-11 SURGICAL SUPPLY — 20 items
BAG SNAP BAND KOVER 36X36 (MISCELLANEOUS) ×1 IMPLANT
BALLN SAPPHIRE 2.0X12 (BALLOONS) ×2
BALLN SAPPHIRE ~~LOC~~ 2.5X10 (BALLOONS) ×1 IMPLANT
BALLN SAPPHIRE ~~LOC~~ 3.5X15 (BALLOONS) ×1 IMPLANT
BALLOON SAPPHIRE 2.0X12 (BALLOONS) IMPLANT
CATH VISTA GUIDE 6FR JR4 (CATHETERS) ×1 IMPLANT
CATH VISTA GUIDE 6FR XBLAD3.5 (CATHETERS) ×1 IMPLANT
DEVICE RAD COMP TR BAND LRG (VASCULAR PRODUCTS) ×1 IMPLANT
ELECT DEFIB PAD ADLT CADENCE (PAD) ×1 IMPLANT
GLIDESHEATH SLEND SS 6F .021 (SHEATH) ×1 IMPLANT
GUIDEWIRE INQWIRE 1.5J.035X260 (WIRE) IMPLANT
INQWIRE 1.5J .035X260CM (WIRE) ×2
KIT ENCORE 26 ADVANTAGE (KITS) ×1 IMPLANT
KIT HEART LEFT (KITS) ×2 IMPLANT
PACK CARDIAC CATHETERIZATION (CUSTOM PROCEDURE TRAY) ×2 IMPLANT
STENT RESOLUTE ONYX 2.25X18 (Permanent Stent) ×1 IMPLANT
STENT RESOLUTE ONYX 3.5X30 (Permanent Stent) ×1 IMPLANT
TRANSDUCER W/STOPCOCK (MISCELLANEOUS) ×2 IMPLANT
TUBING CIL FLEX 10 FLL-RA (TUBING) ×2 IMPLANT
WIRE COUGAR XT STRL 190CM (WIRE) ×1 IMPLANT

## 2019-02-11 NOTE — Progress Notes (Signed)
Bilateral carotid duplex completed. Preliminary results in Chart review CV Proc. [facility], [place] 02/11/2019, 6:47 PM

## 2019-02-11 NOTE — Interval H&P Note (Signed)
History and Physical Interval Note:  02/11/2019 11:18 AM  Kelly [name]  has presented today for PCI  with the diagnosis of CAD with unstable angina.  The various methods of treatment have been discussed with the patient and family. After consideration of risks, benefits and other options for treatment, the patient has consented to  Procedure(s): CORONARY STENT INTERVENTION (N/A) as a surgical intervention.  The patient's history has been reviewed, patient examined, no change in status, stable for surgery.  I have reviewed the patient's chart and labs.  Questions were answered to the patient's satisfaction.    Cath Lab Visit (complete for each Cath Lab visit)  Clinical Evaluation Leading to the Procedure:   ACS: No.  Non-ACS:    Anginal Classification: CCS III  Anti-ischemic medical therapy: Minimal Therapy (1 class of medications)  Non-Invasive Test Results: No non-invasive testing performed  Prior CABG: No previous CABG        Kelly [name]

## 2019-02-11 NOTE — Progress Notes (Signed)
[facility] [place], Rn informed

## 2019-02-12 DIAGNOSIS — I10 Essential (primary) hypertension: Secondary | ICD-10-CM

## 2019-02-12 DIAGNOSIS — E782 Mixed hyperlipidemia: Secondary | ICD-10-CM | POA: Diagnosis not present

## 2019-02-12 DIAGNOSIS — I200 Unstable angina: Secondary | ICD-10-CM

## 2019-02-12 DIAGNOSIS — I25110 Atherosclerotic heart disease of native coronary artery with unstable angina pectoris: Secondary | ICD-10-CM | POA: Diagnosis not present

## 2019-02-12 DIAGNOSIS — I2583 Coronary atherosclerosis due to lipid rich plaque: Secondary | ICD-10-CM | POA: Diagnosis not present

## 2019-02-12 DIAGNOSIS — Z880 Allergy status to penicillin: Secondary | ICD-10-CM | POA: Diagnosis not present

## 2019-02-12 DIAGNOSIS — I2510 Atherosclerotic heart disease of native coronary artery without angina pectoris: Secondary | ICD-10-CM | POA: Diagnosis not present

## 2019-02-12 DIAGNOSIS — Z7902 Long term (current) use of antithrombotics/antiplatelets: Secondary | ICD-10-CM | POA: Diagnosis not present

## 2019-02-12 DIAGNOSIS — Z79899 Other long term (current) drug therapy: Secondary | ICD-10-CM | POA: Diagnosis not present

## 2019-02-12 DIAGNOSIS — Z791 Long term (current) use of non-steroidal anti-inflammatories (NSAID): Secondary | ICD-10-CM | POA: Diagnosis not present

## 2019-02-12 DIAGNOSIS — I350 Nonrheumatic aortic (valve) stenosis: Secondary | ICD-10-CM | POA: Diagnosis not present

## 2019-02-12 DIAGNOSIS — E119 Type 2 diabetes mellitus without complications: Secondary | ICD-10-CM | POA: Diagnosis not present

## 2019-02-12 DIAGNOSIS — Z7982 Long term (current) use of aspirin: Secondary | ICD-10-CM | POA: Diagnosis not present

## 2019-02-12 LAB — BASIC METABOLIC PANEL
Anion gap: 9 (ref 5–15)
BUN: 15 mg/dL (ref 8–23)
CO2: 22 mmol/L (ref 22–32)
Calcium: 9 mg/dL (ref 8.9–10.3)
Chloride: 109 mmol/L (ref 98–111)
Creatinine, Ser: 0.93 mg/dL (ref 0.44–1.00)
GFR calc Af Amer: >60 mL/min (ref >60)
GFR calc non Af Amer: >60 mL/min (ref >60)
Glucose, Bld: 169 mg/dL — ABNORMAL HIGH (ref 70–99)
Potassium: 3.8 mmol/L (ref 3.5–5.1)
Sodium: 140 mmol/L (ref 135–145)

## 2019-02-12 LAB — CBC
HCT: 31.4 % — ABNORMAL LOW (ref 36.0–46.0)
Hemoglobin: 10.4 g/dL — ABNORMAL LOW (ref 12.0–15.0)
MCH: 27.9 pg (ref 26.0–34.0)
MCHC: 33.1 g/dL (ref 30.0–36.0)
MCV: 84.2 fL (ref 80.0–100.0)
Platelets: 109 10*3/uL — ABNORMAL LOW (ref 150–400)
RBC: 3.73 MIL/uL — ABNORMAL LOW (ref 3.87–5.11)
RDW: 14.6 % (ref 11.5–15.5)
WBC: 8.8 10*3/uL (ref 4.0–10.5)
nRBC: 0 % (ref 0.0–0.2)

## 2019-02-12 LAB — GLUCOSE, CAPILLARY
Glucose-Capillary: 202 mg/dL — ABNORMAL HIGH (ref 70–99)
Glucose-Capillary: 220 mg/dL — ABNORMAL HIGH (ref 70–99)

## 2019-02-12 MED FILL — Nitroglycerin IV Soln 100 MCG/ML in D5W: INTRA_ARTERIAL | Qty: 10 | Status: AC

## 2019-02-12 MED FILL — Lidocaine HCl Local Preservative Free (PF) Inj 1%: INTRAMUSCULAR | Qty: 30 | Status: AC

## 2019-02-12 NOTE — Progress Notes (Signed)
Progress Note  Patient Name: Kelly [name] Date of Encounter: 02/12/2019  Primary Cardiologist: [name], MD   Subjective   Postop day #1 RCA and proximal LAD PCI and drug-eluting stenting.  She is asymptomatic.  Ready for discharge  Inpatient Medications    Scheduled Meds: . amLODipine  10 mg Oral Daily  . aspirin EC  81 mg Oral Daily  . atorvastatin  20 mg Oral q1800  . clopidogrel  75 mg Oral Daily  . ferrous sulfate  325 mg Oral Daily  . levothyroxine  75 mcg Oral Q0600  . losartan  100 mg Oral Daily  . pantoprazole  40 mg Oral Daily  . propranolol  40 mg Oral BID  . sodium chloride flush  3 mL Intravenous Q12H   Continuous Infusions: . sodium chloride     PRN Meds: sodium chloride, acetaminophen, LORazepam, ondansetron (ZOFRAN) IV, sodium chloride flush, zolpidem   Vital Signs    Vitals:   02/11/19 1711 02/11/19 1712 02/11/19 1921 02/12/19 0407  BP: 92/64  (!) 138/57 132/66  Pulse: (!) 50  (!) 56 (!) 59  Resp: (!) 23 16 16 14   Temp:   97.8 F (36.6 C) 97.9 F (36.6 C)  TempSrc:   Oral Oral  SpO2: 97%  99% 97%  Weight:    96.1 kg  Height:        Intake/Output Summary (Last 24 hours) at 02/12/2019 1016 Last data filed at 02/12/2019 0400 Gross per 24 hour  Intake 1731.24 ml  Output -  Net 1731.24 ml   Last 3 Weights 02/12/2019 02/11/2019 02/04/2019  Weight (lbs) 211 lb 14.4 oz 215 lb 215 lb  Weight (kg) 96.117 kg 97.523 kg 97.523 kg  Some encounter information is confidential and restricted. Go to Review Flowsheets activity to see all data.      Telemetry    Sinus rhythm- Personally Reviewed  ECG    Normal sinus rhythm at 61 with LVH and repolarization changes- Personally Reviewed  Physical Exam   GEN: No acute distress.   Neck: No JVD Cardiac: RRR, 3/6 outflow tract murmur consistent with aortic stenosis, rubs, or gallops.  Respiratory: Clear to auscultation bilaterally. GI: Soft, nontender, non-distended  MS: No edema; No deformity.  Neuro:  Nonfocal  Psych: Normal affect  Extremities: Right radial puncture site stable   Labs    High Sensitivity Troponin:  No results for input(s): TROPONINIHS in the last 720 hours.    Cardiac EnzymesNo results for input(s): TROPONINI in the last 168 hours. No results for input(s): TROPIPOC in the last 168 hours.   Chemistry Recent Labs  Lab 02/12/19 0407  NA 140  K 3.8  CL 109  CO2 22  GLUCOSE 169*  BUN 15  CREATININE 0.93  CALCIUM 9.0  GFRNONAA >60  GFRAA >60  ANIONGAP 9     Hematology Recent Labs  Lab 02/12/19 0407  WBC 8.8  RBC 3.73*  HGB 10.4*  HCT 31.4*  MCV 84.2  MCH 27.9  MCHC 33.1  RDW 14.6  PLT 109*    BNPNo results for input(s): BNP, PROBNP in the last 168 hours.   DDimer No results for input(s): DDIMER in the last 168 hours.   Radiology    Vas US Carotid  Result Date: 02/11/2019 Carotid Arterial Duplex Study Indications:   Pre TAVR. Other Factors: Severe AS. Limitations:   Anatomy of the neck and high bifurcations Performing Technologist: [facility]  Examination Guidelines: A complete evaluation includes B-mode  imaging, spectral Doppler, color Doppler, and power Doppler as needed of all accessible portions of each vessel. Bilateral testing is considered an integral part of a complete examination. Limited examinations for reoccurring indications Derasmo be performed as noted.  Right Carotid Findings: +----------+--------+--------+--------+---------------------+------------------+           PSV cm/sEDV cm/sStenosisDescribe             Comments           +----------+--------+--------+--------+---------------------+------------------+ CCA Prox  87      11                                                      +----------+--------+--------+--------+---------------------+------------------+ CCA Distal80      17                                   mild intimal wall                                                         changes             +----------+--------+--------+--------+---------------------+------------------+ ICA Prox  85      16      1-39%   heterogenous and     mild plaque                                          irregular                               +----------+--------+--------+--------+---------------------+------------------+ ICA Mid   99      22                                   tortuous           +----------+--------+--------+--------+---------------------+------------------+ ICA Distal115     24                                   tortuous           +----------+--------+--------+--------+---------------------+------------------+ ECA       248     31              heterogenous         mild plaque at the                                                        origin             +----------+--------+--------+--------+---------------------+------------------+ +----------+--------+-------+--------+-------------------+           PSV cm/sEDV cmsDescribeArm Pressure (mmHG) +----------+--------+-------+--------+-------------------+ Subclavian127                                        +----------+--------+-------+--------+-------------------+ +---------+--------+--+--------+-+---------+  VertebralPSV cm/s41EDV cm/s9Antegrade +---------+--------+--+--------+-+---------+  Left Carotid Findings: +----------+--------+--------+--------+---------------------+------------------+           PSV cm/sEDV cm/sStenosisDescribe             Comments           +----------+--------+--------+--------+---------------------+------------------+ CCA Prox  102     13                                                      +----------+--------+--------+--------+---------------------+------------------+ CCA Distal86      16                                   mild intimal wall                                                         changes             +----------+--------+--------+--------+---------------------+------------------+ ICA Prox  100     26      1-39%   heterogenous and     mild plaque                                          irregular                               +----------+--------+--------+--------+---------------------+------------------+ ICA Mid   82      14                                   tortuous           +----------+--------+--------+--------+---------------------+------------------+ ICA Distal89      19                                   tortuous           +----------+--------+--------+--------+---------------------+------------------+ ECA       108     1                                    mild intimal wall                                                         changes            +----------+--------+--------+--------+---------------------+------------------+ +----------+--------+--------+--------+-------------------+ SubclavianPSV cm/sEDV cm/sDescribeArm Pressure (mmHG) +----------+--------+--------+--------+-------------------+           138                                         +----------+--------+--------+--------+-------------------+ +---------+--------+--+--------+-+---------+  VertebralPSV cm/s36EDV cm/s7Antegrade +---------+--------+--+--------+-+---------+  Summary: Right Carotid: Velocities in the right ICA are consistent with a 1-39% stenosis. Left Carotid: Velocities in the left ICA are consistent with a 1-39% stenosis. Vertebrals:  Bilateral vertebral arteries demonstrate antegrade flow. Subclavians: Normal flow hemodynamics were seen in bilateral subclavian              arteries. *See table(s) above for measurements and observations.     Preliminary     Cardiac Studies   Cardiac catheterization/PCI and stent (02/11/2019)  Conclusion    Ost 1st Diag to 1st Diag lesion is 80% stenosed.  Prox LAD lesion is 95% stenosed.  Mid LAD to Dist LAD lesion is  30% stenosed.  2nd Mrg lesion is 99% stenosed.  Mid Cx to Dist Cx lesion is 50% stenosed.  Prox RCA lesion is 70% stenosed.  Ost RPDA lesion is 95% stenosed.  Mid RCA lesion is 40% stenosed.  RPAV lesion is 50% stenosed.  A drug-eluting stent was successfully placed using a STENT RESOLUTE ONYX 3.5X30.  Post intervention, there is a 0% residual stenosis.  A drug-eluting stent was successfully placed using a STENT RESOLUTE ONYX 2.25X18.  Post intervention, there is a 0% residual stenosis.   1. Successful PTCA/DES x 1 proximal RCA 2. Successful PTCA/DES x 1 mid LAD  Will continue medical management of other CAD. Will continue workup for TAVR.    Coronary Diagrams  Diagnostic Dominance: Right  Intervention     Patient Profile     67 y.o. female with a history of tobacco abuse and progressive aortic stenosis who is being evaluated for TAVR.  She underwent diagnostic cath in March by Dr. Angelena Form revealing significant two-vessel disease and was referred for CABG/S AVR.  She was found not to be a good surgical candidate because of a small root and other comorbidities.  She presented yesterday for PCI and drug-eluting stenting of the proximal RCA and LAD.  Assessment & Plan    1: Coronary artery disease- postop day 1 proximal RCA diamondback orbital atherectomy, PCI drug-eluting stent as well as proximal LAD stenting.  Medical therapy of residual CAD.  On aspirin and Plavix.  2: Severe aortic stenosis- progressive and now severe aortic stenosis, symptomatic, being evaluated for TAVR.  3: Tobacco abuse- counseled about the importance of cessation  4: Hyperlipidemia- on atorvastatin  5: Essential hypertension- on amlodipine, losartan and propranolol as an outpatient  Stable for discharge this morning postop day 1 RCA and LAD PCI drug-eluting stenting.  We will arrange follow-up with Dr. Angelena Form he will then continue to plan staged TAVR.  For questions or updates, please  contact [place] Please consult [email or web address] for contact info under        Signed, Quay Burow, MD  02/12/2019, 10:16 AM

## 2019-02-12 NOTE — Progress Notes (Signed)
CARDIAC REHAB PHASE I   PRE:  Rate/Rhythm: 62 SR  BP:  Supine:   Sitting: 149/70  Standing:    SaO2: 96%RA  MODE:  Ambulation: 470 ft   POST:  Rate/Rhythm: 77 SR  BP:  Supine:   Sitting: 162/64  Standing:    SaO2: 96%RA 0810-0917 Pt walked 470 ft on RA with hand held asst and no CP or dizziness. Tolerated well. Education completed with pt who voiced understanding. Stressed importance of plavix with stent. Did not see stent card in room or chart. Pt to ask RN re card. Reviewed NTG use, heart healthy and low carb food choices, walking for ex, smoking cessation and CRP 2. Gave pt smoking cessation handout and encouraged cessation and to call 1800quitnow. Referred to [place] CRP 2 but pt knows will not be able to attend until after TAVR. Does not have smart phone for virtual ex program.   Graylon Good, RN BSN  02/12/2019 9:12 AM

## 2019-02-12 NOTE — Discharge Instructions (Signed)
PLEASE REMEMBER TO BRING ALL OF YOUR MEDICATIONS TO EACH OF YOUR FOLLOW-UP OFFICE VISITS. ° °PLEASE ATTEND ALL SCHEDULED FOLLOW-UP APPOINTMENTS.  ° °Activity: Increase activity slowly as tolerated. You [place] shower, but no soaking baths (or swimming) for 1 week. No driving for 24 hours. No lifting over 5 lbs for 1 week. No sexual activity for 1 week.  ° °You [place] Return to Work: in 1 week (if applicable) ° °Wound Care: You Poss wash cath site gently with soap and water. Keep cath site clean and dry. If you notice pain, swelling, bleeding or pus at your cath site, please call [phone]. ° °

## 2019-02-12 NOTE — Plan of Care (Signed)
  Problem: Education: Goal: Understanding of CV disease, CV risk reduction, and recovery process will improve Outcome: Progressing   Problem: Cardiovascular: Goal: Vascular access site(s) Level 0-1 will be maintained Outcome: Progressing   Problem: Activity: Goal: Ability to return to baseline activity level will improve Outcome: Adequate for Discharge

## 2019-02-12 NOTE — Discharge Summary (Addendum)
Discharge Summary    Patient ID: Kelly [name],  MRN: [id number], DOB/AGE: [birth year] 68 y.o.  Admit date: 02/11/2019 Discharge date: 02/12/2019  Primary Care Provider: Jenna Luo T Primary Cardiologist: [name], MD  Discharge Diagnoses    Principal Problem:   Unstable angina [facility]) Active Problems:   Hypertension   DM (diabetes mellitus), type 2 ([place])   Severe aortic stenosis   Coronary artery disease involving native coronary artery without angina pectoris   Allergies Allergies  Allergen Reactions   Prednisone Itching    Heart rate increased   Celecoxib Rash   Macrodantin [Nitrofurantoin Macrocrystal] Rash   Penicillins Hives    Did it involve swelling of the face/tongue/throat, SOB, or low BP? No Did it involve sudden or severe rash/hives, skin peeling, or any reaction on the inside of your mouth or nose? No Did you need to seek medical attention at a hospital or doctor's office? No When did it last happen?20 + years If all above answers are "NO", [place] proceed with cephalosporin use.     Diagnostic Studies/Procedures    Coronary stent intervention 02/11/2019:  Ost 1st Diag to 1st Diag lesion is 80% stenosed.  Prox LAD lesion is 95% stenosed.  Mid LAD to Dist LAD lesion is 30% stenosed.  2nd Mrg lesion is 99% stenosed.  Mid Cx to Dist Cx lesion is 50% stenosed.  Prox RCA lesion is 70% stenosed.  Ost RPDA lesion is 95% stenosed.  Mid RCA lesion is 40% stenosed.  RPAV lesion is 50% stenosed.  A drug-eluting stent was successfully placed using a STENT RESOLUTE ONYX 3.5X30.  Post intervention, there is a 0% residual stenosis.  A drug-eluting stent was successfully placed using a STENT RESOLUTE ONYX 2.25X18.  Post intervention, there is a 0% residual stenosis.   1. Successful PTCA/DES x 1 proximal RCA 2. Successful PTCA/DES x 1 mid LAD  Will continue medical management of other CAD. Will continue workup for TAVR.    _____________   History of Present Illness     68 yo female with history of CAD, DM, HTN, HLD, memory issues,  tobacco abuse, aortic stenosis, esophageal varices and hepatic cirrhosis who is here today for cardiac follow up. She was evaluated by Dr. Angelena Form as a new patient in February 2019 for aortic valve stenosis. Echo December 2018 with normal LV systolic function. The aortic valve was thickened with restricted leaflet excursion, mean gradient 36 mmHg, peak gradient 59 mmHg, AVA 1.22 cm2, DVI 0.39. She had mild dyspnea with exertion with fatigue and mild dizziness at times. Her aortic stenosis was moderate by echo criteria. Echo 01/27/18 with normal LV systolic function, moderately severe AS with mean gradient of 37 mmHg, peak gradient 64 mmHg, AVA 1.17cm2, DVI 0.37. At her visit in July 2019 she was feeling fatigued but she had also just been found to be anemic. Colonoscopy September 2019 with non bleeding AVMs and diverticular disease. Non-bleeding esophageal varices were seen on upper endoscopy. Echo 08/27/18 with LVEF=60-65%. There is moderately severe aortic stenosis with thickened and calcified valve leaflets. Mean gradient 37 mmHg, peak gradient 66 mmHg, Dimensionless index 0.31. AVA 0.88 cm2. I saw her in the office in February 2020 and she complained of more dyspnea with exertion and fatigue with dizziness. She underwent a cardiac catheterization on 10/07/18 and she was found to have severe three vessel CAD. Her gradients across the aortic valve by cath were consistent with severe AS. She was referred to CT surgery to  consider CABG with AVR. She was seen by Dr. Roxy Manns 10/19/18 and not felt to be a good candidate for surgical AVR and CABG due to the small size of her aortic root. She is back today to discuss PCI prior to proceeding with TAVR. CTA chest/ abdomen/pelvis 10/14/18 showed adequate access for TAVR. There is a left lower lobe pulmonary nodule and non-specific nodular appearing areas of ground  glass attenuation in the upper lungs. (3 month f/u recommended). Cardiac CT showed adequate coronary heights with Annulus area of 317 mm2. Abdominal u/s ordered by GI (Dr. [name]) with findings consistent with hepatic cirrhosis. She is followed in the GI clinic with hepatic cirrhosis. She has undergone recent dental extraction pre TAVR.   She was evaluated outpatient by Dr. Angelena Form 02/04/2019 and reported that she continues to have exertional dyspnea. She has no energy. She was scheduled for an outpatient coronary stent intervention 02/11/2019.  Hospital Course     Consultants: None   1. CAD s/p PCI/DES to LAD and RCA: patient presented with progressive SOB. She had known multivessel CAD based on cath in 09/2018 however was deemed a poor candidate for CABG/AVR. She presented for outpatient coronary stent intervention with successful PCI/DES to LAD and RCA. She was recommended for DAPT with aspirin and plavix.  - Continue aspirin, plavix, and statin - Continue propranolol and amlodipine  2. Severe Aortic Stenosis: undergoing work-up for TAVR with Dr. Angelena Form - Continue outpatient TAVR work-up  3. HTN: BP stable throughout admission - Continue amlodipine, losartan, and propranolol  4. HLD: LDL 74 09/2018 - Continue atorvastatin  5. Tobacco abuse: - Continue to encourage smoking cessation _____________  Discharge Vitals Blood pressure 132/66, pulse (!) 59, temperature 97.9 F (36.6 C), temperature source Oral, resp. rate 14, height 5' 4"  (1.626 m), weight 96.1 kg, SpO2 97 %.  Filed Weights   02/11/19 1028 02/12/19 0407  Weight: 97.5 kg 96.1 kg    Labs & Radiologic Studies    CBC Recent Labs    02/12/19 0407  WBC 8.8  HGB 10.4*  HCT 31.4*  MCV 84.2  PLT 703*   Basic Metabolic Panel Recent Labs    02/12/19 0407  NA 140  K 3.8  CL 109  CO2 22  GLUCOSE 169*  BUN 15  CREATININE 0.93  CALCIUM 9.0   Liver Function Tests No results for input(s): AST, ALT, ALKPHOS,  BILITOT, PROT, ALBUMIN in the last 72 hours. No results for input(s): LIPASE, AMYLASE in the last 72 hours. Cardiac Enzymes No results for input(s): CKTOTAL, CKMB, CKMBINDEX, TROPONINI in the last 72 hours. BNP Invalid input(s): POCBNP D-Dimer No results for input(s): DDIMER in the last 72 hours. Hemoglobin A1C No results for input(s): HGBA1C in the last 72 hours. Fasting Lipid Panel No results for input(s): CHOL, HDL, LDLCALC, TRIG, CHOLHDL, LDLDIRECT in the last 72 hours. Thyroid Function Tests No results for input(s): TSH, T4TOTAL, T3FREE, THYROIDAB in the last 72 hours.  Invalid input(s): FREET3 _____________  [place] Ram US Carotid  Result Date: 02/11/2019 Carotid Arterial Duplex Study Indications:   Pre TAVR. Other Factors: Severe AS. Limitations:   Anatomy of the neck and high bifurcations Performing Technologist: [employer] [facility]  Examination Guidelines: A complete evaluation includes B-mode imaging, spectral Doppler, color Doppler, and power Doppler as needed of all accessible portions of each vessel. Bilateral testing is considered an integral part of a complete examination. Limited examinations for reoccurring indications Funaro be performed as noted.  Right Carotid Findings: +----------+--------+--------+--------+---------------------+------------------+  PSV cm/s EDV cm/s Stenosis Describe              Comments            +----------+--------+--------+--------+---------------------+------------------+  CCA Prox   87       11                                                          +----------+--------+--------+--------+---------------------+------------------+  CCA Distal 80       17                                      mild intimal wall                                                                changes             +----------+--------+--------+--------+---------------------+------------------+  ICA Prox   85       16       1-39%    heterogenous and      mild plaque                                                 irregular                                 +----------+--------+--------+--------+---------------------+------------------+  ICA Mid    99       22                                      tortuous            +----------+--------+--------+--------+---------------------+------------------+  ICA Distal 115      24                                      tortuous            +----------+--------+--------+--------+---------------------+------------------+  ECA        248      31                heterogenous          mild plaque at the                                                               origin              +----------+--------+--------+--------+---------------------+------------------+ +----------+--------+-------+--------+-------------------+  PSV cm/s EDV cms Describe Arm Pressure (mmHG)  +----------+--------+-------+--------+-------------------+  Subclavian 127                                            +----------+--------+-------+--------+-------------------+ +---------+--------+--+--------+-+---------+  Vertebral PSV cm/s 41 EDV cm/s 9 Antegrade  +---------+--------+--+--------+-+---------+  Left Carotid Findings: +----------+--------+--------+--------+---------------------+------------------+             PSV cm/s EDV cm/s Stenosis Describe              Comments            +----------+--------+--------+--------+---------------------+------------------+  CCA Prox   102      13                                                          +----------+--------+--------+--------+---------------------+------------------+  CCA Distal 86       16                                      mild intimal wall                                                                changes             +----------+--------+--------+--------+---------------------+------------------+  ICA Prox   100      26       1-39%    heterogenous and      mild plaque                                                 irregular                                 +----------+--------+--------+--------+---------------------+------------------+  ICA Mid    82       14                                      tortuous            +----------+--------+--------+--------+---------------------+------------------+  ICA Distal 89       19                                      tortuous            +----------+--------+--------+--------+---------------------+------------------+  ECA        108      1  mild intimal wall                                                                changes             +----------+--------+--------+--------+---------------------+------------------+ +----------+--------+--------+--------+-------------------+  Subclavian PSV cm/s EDV cm/s Describe Arm Pressure (mmHG)  +----------+--------+--------+--------+-------------------+             138                                             +----------+--------+--------+--------+-------------------+ +---------+--------+--+--------+-+---------+  Vertebral PSV cm/s 36 EDV cm/s 7 Antegrade  +---------+--------+--+--------+-+---------+  Summary: Right Carotid: Velocities in the right ICA are consistent with a 1-39% stenosis. Left Carotid: Velocities in the left ICA are consistent with a 1-39% stenosis. Vertebrals:  Bilateral vertebral arteries demonstrate antegrade flow. Subclavians: Normal flow hemodynamics were seen in bilateral subclavian              arteries. *See table(s) above for measurements and observations.     Preliminary    US Abdomen Limited Ruq  Result Date: 02/03/2019 CLINICAL DATA:  Hepatic cirrhosis. EXAM: ULTRASOUND ABDOMEN LIMITED RIGHT UPPER QUADRANT COMPARISON:  Ultrasound of July 22, 2018. FINDINGS: Gallbladder: No gallstones or wall thickening visualized. No sonographic Murphy sign noted by sonographer. Common bile duct: Diameter: 3 mm which is within normal limits. Liver: No focal lesion identified.  Heterogeneous echotexture of hepatic parenchyma is noted with nodular contours consistent with hepatic cirrhosis. Portal vein is patent on color Doppler imaging with normal direction of blood flow towards the liver. IMPRESSION: Findings consistent with hepatic cirrhosis. No focal sonographic abnormality is noted in hepatic parenchyma. Electronically Signed   By: Marijo Conception M.D.   On: 02/03/2019 14:14   Disposition   Patient was seen and examined by Dr. Gwenlyn Found who deemed patient as stable for discharge. Follow-up has been arranged. Discharge medications as listed below.   Follow-up Plans & Appointments    Follow-up Information    Charlie Pitter, PA-C Follow up on 02/25/2019.   Specialties: Cardiology, Radiology Why: Please arrive 15 minutes early fo ryour 10:00am appointment Contact information: [address] [place] [address] [phone]          Discharge Instructions    Amb Referral to Cardiac Rehabilitation   Complete by: As directed    Diagnosis: Coronary Stents   After initial evaluation and assessments completed: Virtual Based Care [place] be provided alone or in conjunction with Phase 2 Cardiac Rehab based on patient barriers.: Yes   Diet - low sodium heart healthy   Complete by: As directed    Increase activity slowly   Complete by: As directed       Discharge Medications   Allergies as of 02/12/2019      Reactions   Prednisone Itching   Heart rate increased   Celecoxib Rash   Macrodantin [nitrofurantoin Macrocrystal] Rash   Penicillins Hives   Did it involve swelling of the face/tongue/throat, SOB, or low BP? No Did it involve sudden or severe rash/hives, skin peeling, or any reaction on the inside of your mouth or nose? No Did you  need to seek medical attention at a hospital or doctor's office? No When did it last happen?20 + years If all above answers are "NO", [place] proceed with cephalosporin use.      Medication List    STOP  taking these medications   ibuprofen 200 MG tablet Commonly known as: ADVIL     TAKE these medications   Accu-Chek Aviva Plus test strip Generic drug: glucose blood   Accu-Chek Aviva Plus w/Device Kit   Accu-Chek Softclix Lancets lancets   amLODipine 10 MG tablet Commonly known as: NORVASC Take 1 tablet (10 mg total) by mouth daily.   aspirin EC 81 MG tablet Take 1 tablet (81 mg total) by mouth daily.   atorvastatin 20 MG tablet Commonly known as: LIPITOR TAKE 1 TABLET (20 MG TOTAL) BY MOUTH DAILY.   Citrucel oral powder Generic drug: methylcellulose Take 1 packet by mouth daily as needed (constipation).   clopidogrel 75 MG tablet Commonly known as: PLAVIX Take 1 tablet (75 mg total) by mouth daily.   erythromycin ophthalmic ointment Place 1 application into the left eye at bedtime.   ferrous sulfate 325 (65 FE) MG tablet Take 1 tablet (325 mg total) by mouth daily.   furosemide 20 MG tablet Commonly known as: LASIX TAKE 1 TABLET EVERY DAY  (DISCONTINUE FUROSEMIDE 40MG TWICE DAILY) What changed: See the new instructions.   levothyroxine 75 MCG tablet Commonly known as: SYNTHROID TAKE 1 TABLET EVERY DAY   LORazepam 0.5 MG tablet Commonly known as: ATIVAN TAKE 1/2 TABLET BY MOUTH EVERY 8 HOURS AS NEEDED   losartan 100 MG tablet Commonly known as: COZAAR Take 1 tablet (100 mg total) by mouth daily.   pantoprazole 40 MG tablet Commonly known as: PROTONIX Take 1 tablet (40 mg total) by mouth daily.   potassium chloride SA 20 MEQ tablet Commonly known as: K-DUR Take 1 tablet (20 mEq total) by mouth daily.   propranolol 40 MG tablet Commonly known as: INDERAL Take 1 tablet (40 mg total) by mouth 2 (two) times daily. What changed: when to take this   zolpidem 5 MG tablet Commonly known as: AMBIEN Take 1 tablet (5 mg total) by mouth at bedtime as needed for sleep. Stop ativan        Aspirin prescribed at discharge?  Yes High Intensity Statin  Prescribed? (Lipitor 40-45m or Crestor 20-449m: No: LDL well controlled on low dose atorvastatin Beta Blocker Prescribed? Yes For EF <40%, was ACEI/ARB Prescribed? Yes ADP Receptor Inhibitor Prescribed? (i.e. Plavix etc.-Includes Medically Managed Patients): Yes For EF <40%, Aldosterone Inhibitor Prescribed? No: EF >40% Was EF assessed during THIS hospitalization? No: prior to admission Was Cardiac Rehab II ordered? (Included Medically managed Patients): Yes   Outstanding Labs/Studies   None  Duration of Discharge Encounter   Greater than 30 minutes including physician time.  Signed, KrAbigail ButtsA-C 02/12/2019, 11:19 AM  Agree with assessment and plan by KrRoby LoftsA-C  Pt stable for DC s/p RCA and LAD PCI/DES. She has severe AS being eval by Dr McAngelena Formor TAVR. Exam benign. Will arrage F/U with him 2-3 weeks. On DAPT   JoLorretta HarpM.D., FAProsserFAMccone County Health CenterFALaverta BaltimoreSTokeland[address]SuMine La MotteNC  [phone]/05/2019 2:56 PM

## 2019-02-15 ENCOUNTER — Other Ambulatory Visit: Payer: Self-pay | Admitting: Physician Assistant

## 2019-02-15 DIAGNOSIS — I350 Nonrheumatic aortic (valve) stenosis: Secondary | ICD-10-CM

## 2019-02-17 ENCOUNTER — Other Ambulatory Visit: Payer: Self-pay | Admitting: Physician Assistant

## 2019-02-17 ENCOUNTER — Encounter (HOSPITAL_COMMUNITY): Payer: Self-pay | Admitting: Cardiovascular Disease

## 2019-02-17 ENCOUNTER — Encounter: Payer: Self-pay | Admitting: Physician Assistant

## 2019-02-22 ENCOUNTER — Telehealth: Payer: Self-pay | Admitting: Cardiovascular Disease

## 2019-02-22 MED ORDER — POTASSIUM CHLORIDE ER 10 MEQ PO TBCR: 20.0000 meq | EXTENDED_RELEASE_TABLET | Freq: Every day | ORAL | 3 refills | Status: DC

## 2019-02-22 NOTE — Telephone Encounter (Signed)
Pt calling stating that she did not want Leodis Liverpool, RN, Dr. Camillia Herter nurse to send in Rx to [facility] order pharmacy, she wanted to wait on sending it. Pt would like a call back. Please address

## 2019-02-22 NOTE — Telephone Encounter (Signed)
I spoke with pt who reports as long as prescription was sent for 90 day supply to mail order she would like prescription sent in.  I told her that 90 day supply was sent to [facility]

## 2019-02-22 NOTE — Telephone Encounter (Signed)
I spoke with pt and [facility] [address] [facility]  Pt aware to take 2 daily. I told her she could also dissolve tablets in water to see if easier to swallow.

## 2019-02-22 NOTE — Telephone Encounter (Signed)
Pt calling stating that her medication potassium 20 meq, the pill is too big to for pt to swallow and it gets stuck in her throat. Pt would like to know if there is something else for pt to take, like a smaller pill, like the potassium 10 meq, taking 2 tablets daily. Please address

## 2019-02-25 ENCOUNTER — Other Ambulatory Visit: Payer: Self-pay

## 2019-02-25 ENCOUNTER — Ambulatory Visit: Payer: Medicare HMO | Admitting: Physician Assistant

## 2019-02-25 ENCOUNTER — Ambulatory Visit: Payer: Medicare HMO | Attending: Physician Assistant | Admitting: Physical Therapy

## 2019-02-25 ENCOUNTER — Ambulatory Visit: Payer: Medicare HMO | Admitting: Cardiology

## 2019-02-25 DIAGNOSIS — R2689 Other abnormalities of gait and mobility: Secondary | ICD-10-CM

## 2019-02-25 NOTE — Pre-Procedure Instructions (Signed)
[facility] [place]  02/25/2019      [id number] [address]Phone: [phone]: [phone] [facility]Phone: [phone]: [address] [address]Mail [address] - [place] [address] [address] [place]Phone: [phone]: [phone]   Your procedure is scheduled on July 28  [facility]  Call this number if you have problems the morning of surgery:  [phone]   Remember:  Do not eat or drink after midnight.      Take these medicines the morning of surgery with A SIP OF WATER :  None               7 days prior to surgery STOP taking Aleve, Naproxen, Ibuprofen, Motrin, Advil, Goody's, BC's, all herbal medications, fish oil, and all vitamins.                   Follow your surgeon's instructions on when to stop Aspirin and plavix.  If no instructions were given by your surgeon then you will need to call the office to get those instructions.                           How to Manage Your Diabetes Before and After Surgery  Why is it important to control my blood sugar before and after surgery? . Improving blood sugar levels before and after surgery helps healing and can limit problems. . A way of improving blood sugar control is eating a healthy diet by: o  Eating less sugar and carbohydrates o  Increasing activity/exercise o  Talking with your doctor about reaching your blood sugar goals . High blood sugars (greater than 180 mg/dL) can raise your risk of infections and slow your recovery, so you will need to focus on controlling your diabetes during the weeks before surgery. . Make sure that the doctor who takes care of your diabetes knows about your planned  surgery including the date and location.  How do I manage my blood sugar before surgery? . Check your blood sugar at least 4 times a day, starting 2 days before surgery, to make sure that the level is not too high or low. o Check your blood sugar the morning of your surgery when you wake up and every 2 hours until you get to the Short Stay unit. . If your blood sugar is less than 70 mg/dL, you will need to treat for low blood sugar: o Do not take insulin. o Treat a low blood sugar (less than 70 mg/dL) with  cup of clear juice (cranberry or apple), 4 glucose tablets, OR glucose gel. Recheck blood sugar in 15 minutes after treatment (to make sure it is greater than 70 mg/dL). If your blood sugar is not greater than 70 mg/dL on recheck, call [phone]  for further instructions. . Report your blood sugar to the short stay nurse when you get to Short Stay.  . If you are admitted to the hospital after surgery: o Your blood sugar will be checked by the staff and you will probably be given insulin after surgery (instead of oral diabetes medicines) to make sure you have  good blood sugar levels. o The goal for blood sugar control after surgery is 80-180 mg                                 Do not wear jewelry, make-up or nail polish.  Do not wear lotions, powders, or perfumes, or deodorant.  Do not shave 48 hours prior to surgery.  Men Tucker shave face and neck.  Do not bring valuables to the hospital.  [facility] is not responsible for any belongings or valuables.  Contacts, dentures or bridgework Belcher not be worn into surgery.  Leave your suitcase in the car.  After surgery it Rajagopalan be brought to your room.  For patients admitted to the hospital, discharge time will be determined by your treatment team.  Patients discharged the day of surgery will not be allowed to drive home.    Special instructions:   [place]- Preparing For Surgery  Before surgery, you can play an important role.  Because skin is not sterile, your skin needs to be as free of germs as possible. You can reduce the number of germs on your skin by washing with CHG (chlorahexidine gluconate) Soap before surgery.  CHG is an antiseptic cleaner which kills germs and bonds with the skin to continue killing germs even after washing.    Oral Hygiene is also important to reduce your risk of infection.  Remember - BRUSH YOUR TEETH THE MORNING OF SURGERY WITH YOUR REGULAR TOOTHPASTE  Please do not use if you have an allergy to CHG or antibacterial soaps. If your skin becomes reddened/irritated stop using the CHG.  Do not shave (including legs and underarms) for at least 48 hours prior to first CHG shower. It is OK to shave your face.  Please follow these instructions carefully.   1. Shower the NIGHT BEFORE SURGERY and the MORNING OF SURGERY with CHG.   2. If you chose to wash your hair, wash your hair first as usual with your normal shampoo.  3. After you shampoo, rinse your hair and body thoroughly to remove the shampoo.  4. Use CHG as you would any other liquid soap. You can apply CHG directly to the skin and wash gently with a scrungie or a clean washcloth.   5. Apply the CHG Soap to your body ONLY FROM THE NECK DOWN.  Do not use on open wounds or open sores. Avoid contact with your eyes, ears, mouth and genitals (private parts). Wash Face and genitals (private parts)  with your normal soap.  6. Wash thoroughly, paying special attention to the area where your surgery will be performed.  7. Thoroughly rinse your body with warm water from the neck down.  8. DO NOT shower/wash with your normal soap after using and rinsing off the CHG Soap.  9. Pat yourself dry with a CLEAN TOWEL.  10. Wear CLEAN PAJAMAS to bed the night before surgery, wear comfortable clothes the morning of surgery  11. Place CLEAN SHEETS on your bed the night of your first shower and DO NOT SLEEP WITH PETS.    Day of Surgery:  Do not  apply any deodorants/lotions.  Please wear clean clothes to the hospital/surgery center.   Remember to brush your teeth WITH YOUR REGULAR TOOTHPASTE.    Please read over the following fact sheets that you were given. Coughing and Deep Breathing, MRSA Information and Surgical Site Infection Prevention

## 2019-02-25 NOTE — Therapy (Signed)
[place], [place], [address] Phone: [phone]   Fax:  [phone]  Physical Therapy Evaluation  Patient Details  Name: Kelly [name] MRN: [id number] Date of Birth: [birth year] Referring Provider (PT): [name] MD   Encounter Date: 02/25/2019  PT End of Session - 02/25/19 1135    Visit Number  1    Number of Visits  1    Date for PT Re-Evaluation  02/25/19    PT Start Time  1050    PT Stop Time  1123    PT Time Calculation (min)  33 min    Activity Tolerance  Patient tolerated treatment well    Behavior During Therapy  [facility] for tasks assessed/performed       Past Medical History:  Diagnosis Date  . Anxiety   . Chronic upper GI bleeding    intestinal AVM's, portal gastropathy, esophageal varices on egd 04/2018  . Coronary artery disease involving native coronary artery without angina pectoris   . Dementia ([place]) 09/16/2016  . DIVERTICULITIS OF COLON 06/14/2008   Qualifier: Diagnosis of  By: [name]    . DM (diabetes mellitus), type 2 ([place]) 09/15/2016  . Esophageal varices determined by endoscopy ([place])   . Hyperlipidemia   . Hypertension   . Incidental pulmonary nodule, greater than or equal to 64m 10/14/2018   LLL nodule needs f/u CT or PET-CT scan  . Irritable bowel syndrome 04/06/2008   Centricity Description: IBS Qualifier: Diagnosis of  By: [name] (ADeborra Medina, [name]   Centricity Description: IRRITABLE BOWEL SYNDROME Qualifier: Diagnosis of  By: [name], [name]  . Liver cirrhosis secondary to NASH ([place] 09/16/2016  . NASH (nonalcoholic steatohepatitis)   . Obesity   . Portal hypertensive gastropathy ([place]   . Severe aortic stenosis   . Splenomegaly 09/16/2016  . Thrombocytopenia ([place]   . Thyroid disease   . Vitamin D deficiency     Past Surgical History:  Procedure Laterality Date  . COLONOSCOPY WITH PROPOFOL N/A 04/09/2018   Procedure: COLONOSCOPY WITH PROPOFOL;  Surgeon:  JMilus Banister MD;  [facility];  Service: Endoscopy;  Laterality: N/A;  . CORONARY STENT INTERVENTION N/A 02/11/2019   Procedure: CORONARY STENT INTERVENTION;  Surgeon: MBurnell Blanks MD;  [facility];  Service: Cardiovascular;  Laterality: N/A;  Prox RCA Prox LAD  . ESOPHAGOGASTRODUODENOSCOPY (EGD) WITH PROPOFOL N/A 04/09/2018   Procedure: ESOPHAGOGASTRODUODENOSCOPY (EGD) WITH PROPOFOL;  Surgeon: JMilus Banister MD;  [facility];  Service: Endoscopy;  Laterality: N/A;  . oopherectomy    . OVARIAN CYST REMOVAL    . POLYPECTOMY  04/09/2018   Procedure: POLYPECTOMY;  Surgeon: JMilus Banister MD;  [facility];  Service: Endoscopy;;  . RIGHT/LEFT HEART CATH AND CORONARY ANGIOGRAPHY N/A 10/07/2018   Procedure: RIGHT/LEFT HEART CATH AND CORONARY ANGIOGRAPHY;  Surgeon: MBurnell Blanks MD;  [facility];  Service: Cardiovascular;  Laterality: N/A;    There were no vitals filed for this visit.   Subjective Assessment - 02/25/19 1101    Subjective  pt is a 68y.o F with hx of SOB that has been going on for 6 months with worsening over the last 2-3 months. pt's CC is general fatigue and weakness which she notes began which has progressivley worsened over the last 2-3 months.    Patient Stated Goals  to get heart better    Currently in Pain?  No/denies  [facility] PT Assessment - 02/25/19 1055      Assessment   Medical Diagnosis  Severe Aortic Stenosis     Referring Provider (PT)  [name] MD    Onset Date/Surgical Date  --   6 months     Precautions   Precautions  None      Restrictions   Weight Bearing Restrictions  No      Balance Screen   Has the patient fallen in the past 6 months  No      [place] residence    Living Arrangements  Spouse/significant other    Available Help at Discharge  Family    Type of [place] to  enter;Ramped entrance    [place] of Steps  3    Entrance Stairs-Rails  Can reach both    [place]  One level    [place] - 2 wheels;Cane - single point      ROM / Strength   AROM / PROM / Strength  AROM;Strength      AROM   Overall AROM   Within functional limits for tasks performed      Strength   Overall Strength  Within functional limits for tasks performed    Strength Assessment Site  Hand    Right Hand Grip (lbs)  37    Left Hand Grip (lbs)  32      Ambulation/Gait   Ambulation/Gait  Yes    Gait Pattern  Step-through pattern;Decreased stride length;Trunk flexed    Gait Comments  pt ambulated 185 in 1 min requiring seated rest break lasting 1:03 HR 93 and O2 97%. She resumed  370 ft  requiring rest brak at 4:15 lasting 45 seconds HR 101 and O2 97%. She finihsed the lst 30 seconds of the test walking 64 ft HR 101 and O2 97%.        [facility] Pre-Surgical Assessment - 02/25/19 0001    5 Meter Walk Test- trial 1  5 sec    5 Meter Walk Test- trial 2  5 sec.     5 Meter Walk Test- trial 3  5 sec.    5 meter walk test average  5 sec    4 Stage Balance Test tolerated for:   10 sec.    4 Stage Balance Test Position  4    Sit To Stand Test- trial 1  --   unable to perform   ADL/IADL Independent with:  Bathing;Dressing;Meal prep;Finances    ADL/IADL Needs Assistance with:  [place] work    ADL/IADL Fraility Index  Vulnerable    6 Minute Walk- Baseline  yes    BP (mmHg)  128/62    HR (bpm)  78    02 Sat (%RA)  97 %    Modified Borg Scale for Dyspnea  3- Moderate shortness of breath or breathing difficulty    Perceived Rate of Exertion (Borg)  13- Somewhat hard    6 Minute Walk Post Test  yes    BP (mmHg)  158/67    HR (bpm)  101    02 Sat (%RA)  97 %    Modified Borg Scale for Dyspnea  4- somewhat severe    Perceived Rate of Exertion (Borg)  15- Hard    Aerobic Endurance Distance Walked  619    Endurance additional comments  pt is 59.94% limited  compared to  age related norm              Objective measurements completed on examination: See above findings.                           Plan - 02/25/19 1136    Clinical Impression Statement  see assessment in note    PT Frequency  One time visit    PT Next Visit Plan  Pre-TAVR evaluation    Consulted and Agree with Plan of Care  Patient      Clinical Impression Statement: Pt is a 68 yo F presenting to OP PT for evaluation prior to possible TAVR surgery due to severe aortic stenosis. Pt reports onset of SOB and general fatigue/weakness approximately 6  months ago with progressive worsening over the last 2-3 months. Symptoms are limiting walking speed and endurance. Pt presents with good ROM and strength, good balance and is assessed as low at high fall risk 4 stage balance test, fair walking speed and limited aerobic endurance per 6 minute walk test. pt ambulated 185 in 1 min requiring seated rest break lasting 1:03 with HR 93 and O2 97%, She resumed  370 ft  requiring rest break at 4:15 lasting 45 seconds HR 101 and O2 97%. She finished the last 30 seconds of the test walking 64 ft HR 101 and O2 97%. Pt reported 4/10 shortness of breath on modified scale for dyspnea.Pt ambulated a total of 619 feet in 6 minute walk. SOB, general fatigue increased significantly with 6 minute walk test. Based on the Short Physical Performance Battery, patient has a frailty rating of 8/12 with </= 5/12 considered frail.     Patient demonstrated the following deficits and impairments:     Visit Diagnosis: 1. Other abnormalities of gait and mobility        Problem List Patient Active Problem List   Diagnosis Date Noted  . Unstable angina ([place])   . Coronary artery disease involving native coronary artery without angina pectoris   . Incidental pulmonary nodule, greater than or equal to 67m 10/14/2018  . Severe aortic stenosis   . Esophageal varices determined by endoscopy  ([place]   . Chronic upper GI bleeding   . Polyp of transverse colon   . Esophageal varices without bleeding ([place]   . Portal hypertensive gastropathy ([place]   . Iron deficiency anemia 03/06/2018  . Aortic stenosis   . Vitamin B 12 deficiency 09/16/2016  . Dementia ([place] 09/16/2016  . Splenomegaly 09/16/2016  . Liver cirrhosis secondary to NASH (HL[place] 09/16/2016  . Thrombocytopenia (HDISH 09/15/2016  . DM (diabetes mellitus), type 2 (HDavis 09/15/2016  . Acute encephalopathy 09/14/2016  . Benzodiazepine abuse (HL[place] 09/14/2016  . Hypertension 09/14/2016  . Depression 09/14/2016  . Benzodiazepine overdose 09/14/2016  . NONSPEC ELEVATION OF LEVELS OF TRANSAMINASE/LDH 02/28/2009  . DIVERTICULITIS OF COLON 06/14/2008  . Irritable bowel syndrome 04/06/2008   KStarr LakePT, DPT, LAT, ATC  02/25/19  11:41 AM      [facility]GM[place] [address]Phone: [phone]  Fax:  [phone] Name: CEsliMay MRN: [id number] of Birth: 308-04-1951

## 2019-02-26 ENCOUNTER — Ambulatory Visit (HOSPITAL_COMMUNITY)
Admission: RE | Admit: 2019-02-26 | Discharge: 2019-02-26 | Disposition: A | Discharge status: Home / Self Care | Payer: Medicare HMO | Source: Ambulatory Visit | Attending: Physician Assistant | Admitting: Physician Assistant

## 2019-02-26 ENCOUNTER — Encounter (HOSPITAL_COMMUNITY): Payer: Self-pay

## 2019-02-26 ENCOUNTER — Other Ambulatory Visit: Payer: Self-pay

## 2019-02-26 ENCOUNTER — Other Ambulatory Visit (HOSPITAL_COMMUNITY)
Admission: RE | Admit: 2019-02-26 | Discharge: 2019-02-26 | Disposition: A | Discharge status: Home / Self Care | Payer: Medicare HMO | Source: Ambulatory Visit | Attending: Cardiovascular Disease | Admitting: Cardiovascular Disease

## 2019-02-26 ENCOUNTER — Other Ambulatory Visit: Payer: Self-pay | Admitting: Family Medicine

## 2019-02-26 ENCOUNTER — Encounter (HOSPITAL_COMMUNITY)
Admission: RE | Admit: 2019-02-26 | Discharge: 2019-02-26 | Disposition: A | Discharge status: Home / Self Care | Payer: Medicare HMO | Source: Ambulatory Visit | Attending: Cardiovascular Disease | Admitting: Cardiovascular Disease

## 2019-02-26 DIAGNOSIS — Z1159 Encounter for screening for other viral diseases: Secondary | ICD-10-CM | POA: Diagnosis not present

## 2019-02-26 DIAGNOSIS — R079 Chest pain, unspecified: Secondary | ICD-10-CM | POA: Diagnosis not present

## 2019-02-26 DIAGNOSIS — Z01818 Encounter for other preprocedural examination: Secondary | ICD-10-CM | POA: Insufficient documentation

## 2019-02-26 DIAGNOSIS — Z01811 Encounter for preprocedural respiratory examination: Secondary | ICD-10-CM | POA: Diagnosis present

## 2019-02-26 HISTORY — DX: Depression, unspecified: F32.A

## 2019-02-26 HISTORY — DX: Hypothyroidism, unspecified: E03.9

## 2019-02-26 LAB — COMPREHENSIVE METABOLIC PANEL
ALT: 12 U/L (ref 0–44)
AST: 31 U/L (ref 15–41)
Albumin: 3.1 g/dL — ABNORMAL LOW (ref 3.5–5.0)
Alkaline Phosphatase: 97 U/L (ref 38–126)
Anion gap: 10 (ref 5–15)
BUN: 15 mg/dL (ref 8–23)
CO2: 19 mmol/L — ABNORMAL LOW (ref 22–32)
Calcium: 9.4 mg/dL (ref 8.9–10.3)
Chloride: 110 mmol/L (ref 98–111)
Creatinine, Ser: 0.98 mg/dL (ref 0.44–1.00)
GFR calc Af Amer: >60 mL/min (ref >60)
GFR calc non Af Amer: 58 mL/min — ABNORMAL LOW (ref >60)
Glucose, Bld: 212 mg/dL — ABNORMAL HIGH (ref 70–99)
Potassium: 4.5 mmol/L (ref 3.5–5.1)
Sodium: 139 mmol/L (ref 135–145)
Total Bilirubin: 1 mg/dL (ref 0.3–1.2)
Total Protein: 6 g/dL — ABNORMAL LOW (ref 6.5–8.1)

## 2019-02-26 LAB — URINALYSIS, ROUTINE W REFLEX MICROSCOPIC
Bilirubin Urine: NEGATIVE
Glucose, UA: 50 mg/dL — AB
Hgb urine dipstick: NEGATIVE
Ketones, ur: 5 mg/dL — AB
Nitrite: NEGATIVE
Protein, ur: 30 mg/dL — AB
Specific Gravity, Urine: 1.021 (ref 1.005–1.030)
pH: 5 (ref 5.0–8.0)

## 2019-02-26 LAB — BLOOD GAS, ARTERIAL
Acid-base deficit: 0.5 mmol/L (ref 0.0–2.0)
Bicarbonate: 23 mmol/L (ref 20.0–28.0)
Drawn by: 470591
FIO2: 21
O2 Saturation: 95.4 %
Patient temperature: 98.6
pCO2 arterial: 33.4 mmHg (ref 32.0–48.0)
pH, Arterial: 7.453 — ABNORMAL HIGH (ref 7.350–7.450)
pO2, Arterial: 74.2 mmHg — ABNORMAL LOW (ref 83.0–108.0)

## 2019-02-26 LAB — BRAIN NATRIURETIC PEPTIDE: B Natriuretic Peptide: 126.8 pg/mL — ABNORMAL HIGH (ref 0.0–100.0)

## 2019-02-26 LAB — CBC
HCT: 25.5 % — ABNORMAL LOW (ref 36.0–46.0)
Hemoglobin: 8.3 g/dL — ABNORMAL LOW (ref 12.0–15.0)
MCH: 29.1 pg (ref 26.0–34.0)
MCHC: 32.5 g/dL (ref 30.0–36.0)
MCV: 89.5 fL (ref 80.0–100.0)
Platelets: 205 10*3/uL (ref 150–400)
RBC: 2.85 MIL/uL — ABNORMAL LOW (ref 3.87–5.11)
RDW: 17.2 % — ABNORMAL HIGH (ref 11.5–15.5)
WBC: 11.1 10*3/uL — ABNORMAL HIGH (ref 4.0–10.5)
nRBC: 0.3 % — ABNORMAL HIGH (ref 0.0–0.2)

## 2019-02-26 LAB — PROTIME-INR
INR: 1.1 (ref 0.8–1.2)
Prothrombin Time: 13.7 seconds (ref 11.4–15.2)

## 2019-02-26 LAB — TYPE AND SCREEN
ABO/RH(D): O POS
Antibody Screen: NEGATIVE

## 2019-02-26 LAB — APTT: aPTT: 31 seconds (ref 24–36)

## 2019-02-26 LAB — HEMOGLOBIN A1C
Hgb A1c MFr Bld: 7.4 % — ABNORMAL HIGH (ref 4.8–5.6)
Mean Plasma Glucose: 165.68 mg/dL

## 2019-02-26 LAB — ABO/RH: ABO/RH(D): O POS

## 2019-02-26 LAB — GLUCOSE, CAPILLARY: Glucose-Capillary: 238 mg/dL — ABNORMAL HIGH (ref 70–99)

## 2019-02-26 NOTE — Progress Notes (Signed)
In box message sent to Dr. Angelena Form in regards to abnormal labs  Hgb 8.6 UA - abnormal  Chart sent to anesthesia for review.

## 2019-02-26 NOTE — Progress Notes (Signed)
PCP - Jenna Luo Cardiologist - [place]  Chest x-ray - 02-26-19 EKG - 02-11-19  ECHO - 08-27-18 Cardiac Cath - 10-07-18  DM - Type 2 Fasting Blood Sugar - 110-120 Pt states that PCP took her off of Metformin for 3 month period.  Stated that her blood sugar has been elevated over the past few weeks, so has put herself back on Metformin and has taken it for the past 2 days.  Instructed pt to contact PCP for diabetes followup and for further instructions on taking Metformin.  Aspirin Instructions: Follow surgeon's instructions on when to stop taking ASA  Anesthesia review: yes, heart history  Patient denies fever, cough and chest pain at PAT appointment Since stent placement, pt states she has been SOB and dizzy at times.   Patient verbalized understanding of instructions that were given to them at the PAT appointment. Patient was also instructed that they will need to review over the PAT instructions again at home before surgery.

## 2019-02-27 LAB — SARS CORONAVIRUS 2 (TAT 6-24 HRS): SARS Coronavirus 2: NEGATIVE

## 2019-02-27 LAB — SURGICAL PCR SCREEN
MRSA, PCR: POSITIVE — AB
Staphylococcus aureus: POSITIVE — AB

## 2019-03-01 ENCOUNTER — Other Ambulatory Visit: Payer: Medicare HMO | Admitting: *Deleted

## 2019-03-01 ENCOUNTER — Other Ambulatory Visit: Payer: Self-pay

## 2019-03-01 ENCOUNTER — Encounter (HOSPITAL_COMMUNITY): Payer: Self-pay | Admitting: Vascular Surgery

## 2019-03-01 DIAGNOSIS — D649 Anemia, unspecified: Secondary | ICD-10-CM | POA: Diagnosis not present

## 2019-03-01 DIAGNOSIS — I350 Nonrheumatic aortic (valve) stenosis: Secondary | ICD-10-CM | POA: Diagnosis not present

## 2019-03-01 LAB — CBC
Hematocrit: 23.9 % — ABNORMAL LOW (ref 34.0–46.6)
Hemoglobin: 7.6 g/dL — ABNORMAL LOW (ref 11.1–15.9)
MCH: 27.2 pg (ref 26.6–33.0)
MCHC: 31.8 g/dL (ref 31.5–35.7)
MCV: 86 fL (ref 79–97)
Platelets: 137 10*3/uL — ABNORMAL LOW (ref 150–450)
RBC: 2.79 x10E6/uL — ABNORMAL LOW (ref 3.77–5.28)
RDW: 16.3 % — ABNORMAL HIGH (ref 11.7–15.4)
WBC: 8.9 10*3/uL (ref 3.4–10.8)

## 2019-03-01 MED ORDER — LEVOFLOXACIN IN D5W 500 MG/100ML IV SOLN
500.0000 mg | INTRAVENOUS | Status: DC
  Filled 2019-03-01: qty 100

## 2019-03-01 MED ORDER — MAGNESIUM SULFATE 50 % IJ SOLN
40.0000 meq | INTRAMUSCULAR | Status: DC
  Filled 2019-03-01: qty 9.85

## 2019-03-01 MED ORDER — POTASSIUM CHLORIDE 2 MEQ/ML IV SOLN
80.0000 meq | INTRAVENOUS | Status: DC
  Filled 2019-03-01: qty 40

## 2019-03-01 MED ORDER — DEXMEDETOMIDINE HCL IN NACL 400 MCG/100ML IV SOLN
0.1000 ug/kg/h | INTRAVENOUS | Status: DC
  Filled 2019-03-01: qty 100

## 2019-03-01 MED ORDER — NOREPINEPHRINE 4 MG/250ML-% IV SOLN
0.0000 ug/min | INTRAVENOUS | Status: DC
  Filled 2019-03-01: qty 250

## 2019-03-01 MED ORDER — SODIUM CHLORIDE 0.9 % IV SOLN
INTRAVENOUS | Status: DC
  Filled 2019-03-01: qty 30

## 2019-03-01 MED ORDER — VANCOMYCIN HCL 10 G IV SOLR
1500.0000 mg | INTRAVENOUS | Status: DC
  Filled 2019-03-01: qty 1500

## 2019-03-01 NOTE — Progress Notes (Signed)
Anesthesia Chart Review:  Case: [id number] Date/Time: 03/02/19 0714   Procedures:      TRANSCATHETER AORTIC VALVE REPLACEMENT, TRANSFEMORAL (N/A Chest)     TRANSESOPHAGEAL ECHOCARDIOGRAM (TEE) (N/A )   Anesthesia type: General   Pre-op diagnosis: severe aortic stenosis   Location: [facility] OR ROOM 16 / [place] OR   Surgeon: [name], MD    CT Surgeon: Darylene Price, MD   DISCUSSION: Patient is a 68 year old female scheduled for the above procedure.  History includes smoking, severe AS, CAD (s/p DES pRCA and mLAD 02/11/19), pulmonary nodule (10/14/18 CTA), HTN, HLD, DM2, cirrhosis secondary to NASH (with thrombocytopenia, splenomegaly, portal hypertensive gastropathy and esophageal varices), intestinal AVMs, IBS, diverticulitis, dementia, hypothyroidism. BMI is consistent with obesity.  CBC Latest Ref Rng & Units 02/26/2019 02/12/2019 02/04/2019  WBC 4.0 - 10.5 K/uL 11.1(H) 8.8 9.0  Hemoglobin 12.0 - 15.0 g/dL 8.3(L) 10.4(L) 11.3  Hematocrit 36.0 - 46.0 % 25.5(L) 31.4(L) 34.8  Platelets 150 - 400 K/uL 205 109(L) 133(L)   Repeat STAT CBC on 03/01/19 showed a further decrease in H/H to 7.6/23.9. Per Dr. Angelena Form, TAVR to be postponed, and he is routing results to Dr. Alen Blew for help getting HGB up prior to TAVR.    VS: Pulse (!) (P) 59   Temp (P) 36.9 C   Resp (P) 20   Ht (P) 5' 4"  (1.626 m)   SpO2 (P) 100%   BMI (P) 36.37 kg/m  No BP recorded at her PAT visit.    PROVIDERS: [name], MD is PCP [name], MD is cardiologist Owens Loffler, MD is GI. Had EGD and colonoscopy on 04/09/18 which showed grade 2 varices in the distal esophagus with no signs of recent bleeding, moderate to severe portal gastropathy, no gastric varices, scattered AVMs in the small intestine, a 4 mm polyp in the transverse colon (resected), left colon diverticulosis, possible non-bleeding atypical colonic AVMs   LABS: Preoperative labs noted. UA and CBC already communicated to cardiology. See  DISCUSSION. (all labs ordered are listed, but only abnormal results are displayed)  Labs Reviewed  SURGICAL PCR SCREEN - Abnormal; Notable for the following components:      Result Value   MRSA, PCR POSITIVE (*)    Staphylococcus aureus POSITIVE (*)    All other components within normal limits  GLUCOSE, CAPILLARY - Abnormal; Notable for the following components:   Glucose-Capillary 238 (*)    All other components within normal limits  BLOOD GAS, ARTERIAL - Abnormal; Notable for the following components:   pH, Arterial 7.453 (*)    pO2, Arterial 74.2 (*)    All other components within normal limits  BRAIN NATRIURETIC PEPTIDE - Abnormal; Notable for the following components:   B Natriuretic Peptide 126.8 (*)    All other components within normal limits  CBC - Abnormal; Notable for the following components:   WBC 11.1 (*)    RBC 2.85 (*)    Hemoglobin 8.3 (*)    HCT 25.5 (*)    RDW 17.2 (*)    nRBC 0.3 (*)    All other components within normal limits  COMPREHENSIVE METABOLIC PANEL - Abnormal; Notable for the following components:   CO2 19 (*)    Glucose, Bld 212 (*)    Total Protein 6.0 (*)    Albumin 3.1 (*)    GFR calc non Af Amer 58 (*)    All other components within normal limits  HEMOGLOBIN A1C - Abnormal; Notable for the following  components:   Hgb A1c MFr Bld 7.4 (*)    All other components within normal limits  URINALYSIS, ROUTINE W REFLEX MICROSCOPIC - Abnormal; Notable for the following components:   Color, Urine AMBER (*)    APPearance HAZY (*)    Glucose, UA 50 (*)    Ketones, ur 5 (*)    Protein, ur 30 (*)    Leukocytes,Ua MODERATE (*)    Bacteria, UA RARE (*)    Non Squamous Epithelial 0-5 (*)    All other components within normal limits  APTT  PROTIME-INR  TYPE AND SCREEN  ABO/RH     IMAGES: CXR 02/26/19: IMPRESSION: No evidence for acute cardiopulmonary abnormality.  CTA chest/abd/pelvis 10/14/18: IMPRESSION: 1. Vascular findings and  measurements pertinent to potential TAVR procedure, as detailed above. 2. Severe thickening calcification of the aortic valve, compatible with the reported clinical history of severe aortic stenosis. 3. Aortic atherosclerosis, in addition to left main and 3 vessel coronary artery disease. 4. 10 x 11 mm left lower lobe pulmonary nodule, new compared to remote prior study from 2009. Consider one of the following in 3 months for both low-risk and high-risk individuals: (a) repeat chest CT or (b) follow-up PET-CT. This recommendation follows the consensus statement: Guidelines for Management of Incidental Pulmonary Nodules Detected on CT Images: From the Fleischner Society 2017; Radiology 2017; 284:228-243. 5. Several nonspecific slightly nodular appearing areas of ground-glass attenuation most evident throughout the upper lungs. Attention at time of repeat CT or PET-CT examination is recommended. 6. Mild cardiomegaly. 7. Cirrhosis. 8. Colonic diverticulosis without evidence of acute diverticulitis at this time. 9. Additional incidental findings, as above.   EKG: 02/12/19: Normal sinus rhythm Cannot rule out Anterior infarct , age undetermined ST & T wave abnormality, consider lateral ischemia Abnormal ECG Confirmed by Dixie Dials [phone]) on 02/12/2019 8:29:08 AM   CV: Cardiac cath 02/11/19:  Colon [place] 1st Diag to 1st Diag lesion is 80% stenosed.  Prox LAD lesion is 95% stenosed.  Mid LAD to Dist LAD lesion is 30% stenosed.  2nd Mrg lesion is 99% stenosed.  Mid Cx to Dist Cx lesion is 50% stenosed.  Prox RCA lesion is 70% stenosed.  Ost RPDA lesion is 95% stenosed.  Mid RCA lesion is 40% stenosed.  RPAV lesion is 50% stenosed.  A drug-eluting stent was successfully placed using a STENT RESOLUTE ONYX 3.5X30.  Post intervention, there is a 0% residual stenosis.  A drug-eluting stent was successfully placed using a STENT RESOLUTE ONYX 2.25X18.  Post intervention, there is a  0% residual stenosis. 1. Successful PTCA/DES x 1 proximal RCA 2. Successful PTCA/DES x 1 mid LAD Will continue medical management of other CAD. Will continue workup for TAVR.  (3V CAD with severe aortic stenosis with mean gradient 41 mmHg by RHC/LHC on 10/07/18.)  Carotid US 02/11/19: Summary: Right Carotid: Velocities in the right ICA are consistent with a 1-39% stenosis. Left Carotid: Velocities in the left ICA are consistent with a 1-39% stenosis. Vertebrals:  Bilateral vertebral arteries demonstrate antegrade flow. Subclavians: Normal flow hemodynamics were seen in bilateral subclavian              arteries.  CT coronary 10/14/18: IMPRESSION: 1. Trileaflet aortic valve with moderate calcifications, severe thickening, and moderate leaflet restriction 2.   AV calcium score 826 3.  Sufficient coronary to annulus distance. 4.  Optimum Fluoroscopic Angle for Delivery: LAO 6, CAU 6 5.  No thrombus in the left atrial appendage. 6.  Annulus area  317 mm2, suitable for a 20 mm Sapien 3 valve. 7. Severe mixed atherosclerosis of the aortic arch, and descending thoracic aorta. There is a focal area of severe, protruding atherosclerosis in the descending thoracic aorta, at the level just inferior to the right inferior pulmonary vein. 8. High take off of left main coronary artery off of left sinus of Valsalva.  Echo 08/27/18: Study Conclusions - Left ventricle: The cavity size was normal. There was moderate   concentric hypertrophy. Systolic function was normal. The   estimated ejection fraction was in the range of 60% to 65%. Wall   motion was normal; there were no regional wall motion   abnormalities. Doppler parameters are consistent with abnormal   left ventricular relaxation (grade 1 diastolic dysfunction).   Doppler parameters are consistent with elevated mean left atrial   filling pressure. - Aortic valve: Valve mobility was restricted. There was moderate   to severe stenosis. Valve  area (VTI): 0.88 cm^2. Valve area   (Vmax): 0.94 cm^2. Valve area (Vmean): 0.93 cm^2. - Left atrium: The atrium was mildly dilated. - Tricuspid valve: There was mild-moderate regurgitation.   Past Medical History:  Diagnosis Date  . Anxiety   . Chronic upper GI bleeding    intestinal AVM's, portal gastropathy, esophageal varices on egd 04/2018  . Coronary artery disease involving native coronary artery without angina pectoris   . Dementia ([place]) 09/16/2016  . Depression   . DIVERTICULITIS OF COLON 06/14/2008   Qualifier: Diagnosis of  By: [name]    . DM (diabetes mellitus), type 2 ([place]) 09/15/2016  . Esophageal varices determined by endoscopy ([place])   . Hyperlipidemia   . Hypertension   . Hypothyroidism   . Incidental pulmonary nodule, greater than or equal to 7m 10/14/2018   LLL nodule needs f/u CT or PET-CT scan  . Irritable bowel syndrome 04/06/2008   Centricity Description: IBS Qualifier: Diagnosis of  By: [name] (ADeborra Medina, [name]   Centricity Description: IRRITABLE BOWEL SYNDROME Qualifier: Diagnosis of  By: [name], [name]  . Liver cirrhosis secondary to NASH ([place] 09/16/2016  . NASH (nonalcoholic steatohepatitis)   . Obesity   . Portal hypertensive gastropathy (HC[place]   . Severe aortic stenosis   . Splenomegaly 09/16/2016  . Thrombocytopenia ([place]   . Thyroid disease   . Vitamin D deficiency     Past Surgical History:  Procedure Laterality Date  . COLONOSCOPY WITH PROPOFOL N/A 04/09/2018   Procedure: COLONOSCOPY WITH PROPOFOL;  Surgeon: JMilus Banister MD;  [facility];  Service: Endoscopy;  Laterality: N/A;  . CORONARY STENT INTERVENTION N/A 02/11/2019   Procedure: CORONARY STENT INTERVENTION;  Surgeon: MBurnell Blanks MD;  [facility];  Service: Cardiovascular;  Laterality: N/A;  Prox RCA Prox LAD  . ESOPHAGOGASTRODUODENOSCOPY (EGD) WITH PROPOFOL N/A 04/09/2018   Procedure: ESOPHAGOGASTRODUODENOSCOPY (EGD) WITH PROPOFOL;   Surgeon: JMilus Banister MD;  [facility];  Service: Endoscopy;  Laterality: N/A;  . oopherectomy    . OVARIAN CYST REMOVAL    . POLYPECTOMY  04/09/2018   Procedure: POLYPECTOMY;  Surgeon: JMilus Banister MD;  [facility];  Service: Endoscopy;;  . RIGHT/LEFT HEART CATH AND CORONARY ANGIOGRAPHY N/A 10/07/2018   Procedure: RIGHT/LEFT HEART CATH AND CORONARY ANGIOGRAPHY;  Surgeon: MBurnell Blanks MD;  [facility];  Service: Cardiovascular;  Laterality: N/A;    MEDICATIONS: . ACCU-CHEK AVIVA PLUS test strip  . ACCU-CHEK SOFTCLIX LANCETS lancets  .  amLODipine (NORVASC) 10 MG tablet  . aspirin EC 81 MG tablet  . atorvastatin (LIPITOR) 20 MG tablet  . Blood Glucose Monitoring Suppl (ACCU-CHEK AVIVA PLUS) w/Device KIT  . clopidogrel (PLAVIX) 75 MG tablet  . erythromycin ophthalmic ointment  . ferrous sulfate 325 (65 FE) MG tablet  . furosemide (LASIX) 20 MG tablet  . levothyroxine (SYNTHROID, LEVOTHROID) 75 MCG tablet  . LORazepam (ATIVAN) 0.5 MG tablet  . losartan (COZAAR) 100 MG tablet  . methylcellulose (CITRUCEL) oral powder  . pantoprazole (PROTONIX) 40 MG tablet  . potassium chloride (K-DUR) 10 MEQ tablet  . propranolol (INDERAL) 40 MG tablet  . zolpidem (AMBIEN) 5 MG tablet   . cyanocobalamin ((VITAMIN B-12)) injection 1,000 mcg     Myra Gianotti, PA-C Surgical Short Stay/Anesthesiology [facility] Phone [phone] [facility] Phone [phone] 03/01/2019 1:50 PM

## 2019-03-02 ENCOUNTER — Inpatient Hospital Stay (HOSPITAL_COMMUNITY): Admission: RE | Admit: 2019-03-02 | Payer: Medicare HMO | Source: Home / Self Care | Admitting: Cardiovascular Disease

## 2019-03-02 ENCOUNTER — Encounter (HOSPITAL_COMMUNITY): Admission: RE | Payer: Medicare HMO | Source: Home / Self Care

## 2019-03-02 ENCOUNTER — Telehealth: Payer: Self-pay | Admitting: Oncology

## 2019-03-02 ENCOUNTER — Other Ambulatory Visit: Payer: Self-pay | Admitting: Oncology

## 2019-03-02 SURGERY — IMPLANTATION, AORTIC VALVE, TRANSCATHETER, FEMORAL APPROACH
Anesthesia: General | Site: Chest

## 2019-03-02 NOTE — Telephone Encounter (Signed)
Scheduled appt per 7/28 sch message - pt aware of appt date and time

## 2019-03-05 ENCOUNTER — Inpatient Hospital Stay: Payer: Medicare HMO | Attending: Oncology

## 2019-03-05 ENCOUNTER — Other Ambulatory Visit: Payer: Self-pay

## 2019-03-05 VITALS — BP 134/68 | HR 71 | Temp 98.0°F | Resp 18

## 2019-03-05 DIAGNOSIS — D509 Iron deficiency anemia, unspecified: Secondary | ICD-10-CM

## 2019-03-05 MED ORDER — SODIUM CHLORIDE 0.9 % IV SOLN
Freq: Once | INTRAVENOUS | Status: AC
  Administered 2019-03-05: 14:00:00 via INTRAVENOUS
  Filled 2019-03-05: qty 250

## 2019-03-05 MED ORDER — SODIUM CHLORIDE 0.9 % IV SOLN
510.0000 mg | Freq: Once | INTRAVENOUS | Status: AC
  Administered 2019-03-05: 15:00:00 510 mg via INTRAVENOUS
  Filled 2019-03-05: qty 17

## 2019-03-05 NOTE — Patient Instructions (Signed)
Ferumoxytol injection What is this medicine? FERUMOXYTOL is an iron complex. Iron is used to make healthy red blood cells, which carry oxygen and nutrients throughout the body. This medicine is used to treat iron deficiency anemia. This medicine Richoux be used for other purposes; ask your health care provider or pharmacist if you have questions. COMMON BRAND NAME(S): Feraheme What should I tell my health care provider before I take this medicine? They need to know if you have any of these conditions:  anemia not caused by low iron levels  high levels of iron in the blood  magnetic resonance imaging (MRI) test scheduled  an unusual or allergic reaction to iron, other medicines, foods, dyes, or preservatives  pregnant or trying to get pregnant  breast-feeding How should I use this medicine? This medicine is for injection into a vein. It is given by a health care professional in a hospital or clinic setting. Talk to your pediatrician regarding the use of this medicine in children. Special care Tackett be needed. Overdosage: If you think you have taken too much of this medicine contact a poison control center or emergency room at once. NOTE: This medicine is only for you. Do not share this medicine with others. What if I miss a dose? It is important not to miss your dose. Call your doctor or health care professional if you are unable to keep an appointment. What Trinkle interact with this medicine? This medicine Pedley interact with the following medications:  other iron products This list Shiflett not describe all possible interactions. Give your health care provider a list of all the medicines, herbs, non-prescription drugs, or dietary supplements you use. Also tell them if you smoke, drink alcohol, or use illegal drugs. Some items Mcnamee interact with your medicine. What should I watch for while using this medicine? Visit your doctor or healthcare professional regularly. Tell your doctor or healthcare  professional if your symptoms do not start to get better or if they get worse. You Carawan need blood work done while you are taking this medicine. You Lofstrom need to follow a special diet. Talk to your doctor. Foods that contain iron include: whole grains/cereals, dried fruits, beans, or peas, leafy green vegetables, and organ meats (liver, kidney). What side effects Reinertsen I notice from receiving this medicine? Side effects that you should report to your doctor or health care professional as soon as possible:  allergic reactions like skin rash, itching or hives, swelling of the face, lips, or tongue  breathing problems  changes in blood pressure  feeling faint or lightheaded, falls  fever or chills  flushing, sweating, or hot feelings  swelling of the ankles or feet Side effects that usually do not require medical attention (report to your doctor or health care professional if they continue or are bothersome):  diarrhea  headache  nausea, vomiting  stomach pain This list Naim not describe all possible side effects. Call your doctor for medical advice about side effects. You Angelino report side effects to FDA at 1-800-FDA-1088. Where should I keep my medicine? This drug is given in a hospital or clinic and will not be stored at home. NOTE: This sheet is a summary. It Setzer not cover all possible information. If you have questions about this medicine, talk to your doctor, pharmacist, or health care provider.  2020 Elsevier/Gold Standard (2016-09-09 20:21:10) Coronavirus (COVID-19) Are you at risk?  Are you at risk for the Coronavirus (COVID-19)?  To be considered HIGH RISK for Coronavirus (COVID-19),   you have to meet the following criteria:  . Traveled to [place], [place], [place], [place] or [place]; or in the [place] to [place], [place], [place], or [place]; and have fever, cough, and shortness of breath within the last 2 weeks of travel OR . Been in close contact with a person  diagnosed with COVID-19 within the last 2 weeks and have fever, cough, and shortness of breath . IF YOU DO NOT MEET THESE CRITERIA, YOU ARE CONSIDERED LOW RISK FOR COVID-19.  What to do if you are HIGH RISK for COVID-19?  . If you are having a medical emergency, call 911. . Seek medical care right away. Before you go to a doctor's office, urgent care or emergency department, call ahead and tell them about your recent travel, contact with someone diagnosed with COVID-19, and your symptoms. You should receive instructions from your physician's office regarding next steps of care.  . When you arrive at healthcare provider, tell the healthcare staff immediately you have returned from visiting [place], [place], [place], [place] or [place]; or traveled in the [place] to [place], [place], [place], or [place]; in the last two weeks or you have been in close contact with a person diagnosed with COVID-19 in the last 2 weeks.   . Tell the health care staff about your symptoms: fever, cough and shortness of breath. . After you have been seen by a medical provider, you will be either: o Tested for (COVID-19) and discharged home on quarantine except to seek medical care if symptoms worsen, and asked to  - Stay home and avoid contact with others until you get your results (4-5 days)  - Avoid travel on public transportation if possible (such as bus, train, or airplane) or o Sent to the Emergency Department by EMS for evaluation, COVID-19 testing, and possible admission depending on your condition and test results.  What to do if you are LOW RISK for COVID-19?  Reduce your risk of any infection by using the same precautions used for avoiding the common cold or flu:  . Wash your hands often with soap and warm water for at least 20 seconds.  If soap and water are not readily available, use an alcohol-based hand sanitizer with at least 60% alcohol.  . If coughing or sneezing, cover your mouth and nose by  coughing or sneezing into the elbow areas of your shirt or coat, into a tissue or into your sleeve (not your hands). . Avoid shaking hands with others and consider head nods or verbal greetings only. . Avoid touching your eyes, nose, or mouth with unwashed hands.  . Avoid close contact with people who are sick. . Avoid places or events with large numbers of people in one location, like concerts or sporting events. . Carefully consider travel plans you have or are making. . If you are planning any travel outside or inside the [place], visit the CDC's Travelers' Health webpage for the latest health notices. . If you have some symptoms but not all symptoms, continue to monitor at home and seek medical attention if your symptoms worsen. . If you are having a medical emergency, call 911.   ADDITIONAL HEALTHCARE OPTIONS FOR PATIENTS  [facility] / e-Visit: [email or web address]         [facility]: [phone]  [facility]: [phone]                   [facility]: [phone]   

## 2019-03-12 ENCOUNTER — Inpatient Hospital Stay: Payer: Medicare HMO | Attending: Oncology

## 2019-03-12 ENCOUNTER — Other Ambulatory Visit: Payer: Self-pay

## 2019-03-12 VITALS — BP 116/66 | HR 65 | Temp 98.0°F | Resp 18

## 2019-03-12 DIAGNOSIS — K7460 Unspecified cirrhosis of liver: Secondary | ICD-10-CM

## 2019-03-12 DIAGNOSIS — D509 Iron deficiency anemia, unspecified: Secondary | ICD-10-CM

## 2019-03-12 DIAGNOSIS — D696 Thrombocytopenia, unspecified: Secondary | ICD-10-CM | POA: Insufficient documentation

## 2019-03-12 MED ORDER — SODIUM CHLORIDE 0.9 % IV SOLN
510.0000 mg | Freq: Once | INTRAVENOUS | Status: AC
  Administered 2019-03-12: 510 mg via INTRAVENOUS
  Filled 2019-03-12: qty 17

## 2019-03-12 MED ORDER — SODIUM CHLORIDE 0.9 % IV SOLN
Freq: Once | INTRAVENOUS | Status: AC
  Administered 2019-03-12: 13:00:00 via INTRAVENOUS
  Filled 2019-03-12: qty 250

## 2019-03-12 NOTE — Patient Instructions (Signed)
Ferumoxytol injection What is this medicine? FERUMOXYTOL is an iron complex. Iron is used to make healthy red blood cells, which carry oxygen and nutrients throughout the body. This medicine is used to treat iron deficiency anemia. This medicine Silberman be used for other purposes; ask your health care provider or pharmacist if you have questions. COMMON BRAND NAME(S): Feraheme What should I tell my health care provider before I take this medicine? They need to know if you have any of these conditions:  anemia not caused by low iron levels  high levels of iron in the blood  magnetic resonance imaging (MRI) test scheduled  an unusual or allergic reaction to iron, other medicines, foods, dyes, or preservatives  pregnant or trying to get pregnant  breast-feeding How should I use this medicine? This medicine is for injection into a vein. It is given by a health care professional in a hospital or clinic setting. Talk to your pediatrician regarding the use of this medicine in children. Special care Bellanca be needed. Overdosage: If you think you have taken too much of this medicine contact a poison control center or emergency room at once. NOTE: This medicine is only for you. Do not share this medicine with others. What if I miss a dose? It is important not to miss your dose. Call your doctor or health care professional if you are unable to keep an appointment. What Alverio interact with this medicine? This medicine Mantei interact with the following medications:  other iron products This list Saulsbury not describe all possible interactions. Give your health care provider a list of all the medicines, herbs, non-prescription drugs, or dietary supplements you use. Also tell them if you smoke, drink alcohol, or use illegal drugs. Some items Feely interact with your medicine. What should I watch for while using this medicine? Visit your doctor or healthcare professional regularly. Tell your doctor or healthcare  professional if your symptoms do not start to get better or if they get worse. You Liverman need blood work done while you are taking this medicine. You Volland need to follow a special diet. Talk to your doctor. Foods that contain iron include: whole grains/cereals, dried fruits, beans, or peas, leafy green vegetables, and organ meats (liver, kidney). What side effects Lanese I notice from receiving this medicine? Side effects that you should report to your doctor or health care professional as soon as possible:  allergic reactions like skin rash, itching or hives, swelling of the face, lips, or tongue  breathing problems  changes in blood pressure  feeling faint or lightheaded, falls  fever or chills  flushing, sweating, or hot feelings  swelling of the ankles or feet Side effects that usually do not require medical attention (report to your doctor or health care professional if they continue or are bothersome):  diarrhea  headache  nausea, vomiting  stomach pain This list Cid not describe all possible side effects. Call your doctor for medical advice about side effects. You Cwynar report side effects to FDA at 1-800-FDA-1088. Where should I keep my medicine? This drug is given in a hospital or clinic and will not be stored at home. NOTE: This sheet is a summary. It Hardiman not cover all possible information. If you have questions about this medicine, talk to your doctor, pharmacist, or health care provider.  2020 Elsevier/Gold Standard (2016-09-09 20:21:10) Coronavirus (COVID-19) Are you at risk?  Are you at risk for the Coronavirus (COVID-19)?  To be considered HIGH RISK for Coronavirus (COVID-19),   you have to meet the following criteria:  . Traveled to [place], [place], [place], [place] or [place]; or in the [place] to [place], [place], [place], or [place]; and have fever, cough, and shortness of breath within the last 2 weeks of travel OR . Been in close contact with a person  diagnosed with COVID-19 within the last 2 weeks and have fever, cough, and shortness of breath . IF YOU DO NOT MEET THESE CRITERIA, YOU ARE CONSIDERED LOW RISK FOR COVID-19.  What to do if you are HIGH RISK for COVID-19?  . If you are having a medical emergency, call 911. . Seek medical care right away. Before you go to a doctor's office, urgent care or emergency department, call ahead and tell them about your recent travel, contact with someone diagnosed with COVID-19, and your symptoms. You should receive instructions from your physician's office regarding next steps of care.  . When you arrive at healthcare provider, tell the healthcare staff immediately you have returned from visiting [place], [place], [place], [place] or [place]; or traveled in the [place] to [place], [place], [place], or [place]; in the last two weeks or you have been in close contact with a person diagnosed with COVID-19 in the last 2 weeks.   . Tell the health care staff about your symptoms: fever, cough and shortness of breath. . After you have been seen by a medical provider, you will be either: o Tested for (COVID-19) and discharged home on quarantine except to seek medical care if symptoms worsen, and asked to  - Stay home and avoid contact with others until you get your results (4-5 days)  - Avoid travel on public transportation if possible (such as bus, train, or airplane) or o Sent to the Emergency Department by EMS for evaluation, COVID-19 testing, and possible admission depending on your condition and test results.  What to do if you are LOW RISK for COVID-19?  Reduce your risk of any infection by using the same precautions used for avoiding the common cold or flu:  . Wash your hands often with soap and warm water for at least 20 seconds.  If soap and water are not readily available, use an alcohol-based hand sanitizer with at least 60% alcohol.  . If coughing or sneezing, cover your mouth and nose by  coughing or sneezing into the elbow areas of your shirt or coat, into a tissue or into your sleeve (not your hands). . Avoid shaking hands with others and consider head nods or verbal greetings only. . Avoid touching your eyes, nose, or mouth with unwashed hands.  . Avoid close contact with people who are sick. . Avoid places or events with large numbers of people in one location, like concerts or sporting events. . Carefully consider travel plans you have or are making. . If you are planning any travel outside or inside the [place], visit the CDC's Travelers' Health webpage for the latest health notices. . If you have some symptoms but not all symptoms, continue to monitor at home and seek medical attention if your symptoms worsen. . If you are having a medical emergency, call 911.   ADDITIONAL HEALTHCARE OPTIONS FOR PATIENTS  [facility] / e-Visit: [email or web address]         [facility]: [phone]  [facility]: [phone]                   [facility]: [phone]   

## 2019-03-18 ENCOUNTER — Ambulatory Visit: Payer: Medicare HMO | Admitting: Physician Assistant

## 2019-03-22 ENCOUNTER — Other Ambulatory Visit: Payer: Self-pay | Admitting: Physician Assistant

## 2019-03-22 ENCOUNTER — Other Ambulatory Visit: Payer: Self-pay | Admitting: *Deleted

## 2019-03-22 DIAGNOSIS — D649 Anemia, unspecified: Secondary | ICD-10-CM

## 2019-03-22 NOTE — Telephone Encounter (Signed)
Received fax requesting refill on Ambien.   Ok to refill??  Last office visit/ refill 01/14/2019, #1 refill.

## 2019-03-23 MED ORDER — ZOLPIDEM TARTRATE 5 MG PO TABS: 5.0000 mg | ORAL_TABLET | Freq: Every evening | ORAL | 1 refills | Status: DC | PRN

## 2019-03-24 ENCOUNTER — Other Ambulatory Visit: Payer: Self-pay | Admitting: Family Medicine

## 2019-04-05 ENCOUNTER — Other Ambulatory Visit: Payer: Self-pay | Admitting: Family Medicine

## 2019-04-08 ENCOUNTER — Telehealth: Payer: Self-pay | Admitting: Family Medicine

## 2019-04-08 ENCOUNTER — Other Ambulatory Visit: Payer: Self-pay

## 2019-04-08 MED ORDER — PROPRANOLOL HCL 40 MG PO TABS: 40.0000 mg | ORAL_TABLET | Freq: Every day | ORAL | 0 refills | Status: DC

## 2019-04-08 NOTE — Telephone Encounter (Signed)
Patient is calling to ask some questions about her propranolol  Please call her back at [phone]

## 2019-04-13 ENCOUNTER — Other Ambulatory Visit: Payer: Self-pay

## 2019-04-13 DIAGNOSIS — I350 Nonrheumatic aortic (valve) stenosis: Secondary | ICD-10-CM

## 2019-04-14 ENCOUNTER — Other Ambulatory Visit: Payer: Self-pay

## 2019-04-14 ENCOUNTER — Other Ambulatory Visit: Payer: Medicare HMO | Admitting: *Deleted

## 2019-04-14 DIAGNOSIS — D649 Anemia, unspecified: Secondary | ICD-10-CM

## 2019-04-14 LAB — CBC
Hematocrit: 27.4 % — ABNORMAL LOW (ref 34.0–46.6)
Hemoglobin: 9.4 g/dL — ABNORMAL LOW (ref 11.1–15.9)
MCH: 29.5 pg (ref 26.6–33.0)
MCHC: 34.3 g/dL (ref 31.5–35.7)
MCV: 86 fL (ref 79–97)
Platelets: 143 10*3/uL — ABNORMAL LOW (ref 150–450)
RBC: 3.19 x10E6/uL — ABNORMAL LOW (ref 3.77–5.28)
RDW: 14.1 % (ref 11.7–15.4)
WBC: 9.2 10*3/uL (ref 3.4–10.8)

## 2019-04-16 ENCOUNTER — Other Ambulatory Visit (HOSPITAL_COMMUNITY)
Admission: RE | Admit: 2019-04-16 | Discharge: 2019-04-16 | Disposition: A | Discharge status: Home / Self Care | Payer: Medicare HMO | Source: Ambulatory Visit | Attending: Cardiovascular Disease | Admitting: Cardiovascular Disease

## 2019-04-16 ENCOUNTER — Ambulatory Visit (HOSPITAL_COMMUNITY)
Admission: RE | Admit: 2019-04-16 | Discharge: 2019-04-16 | Disposition: A | Discharge status: Home / Self Care | Payer: Medicare HMO | Source: Ambulatory Visit | Attending: Cardiovascular Disease | Admitting: Cardiovascular Disease

## 2019-04-16 ENCOUNTER — Encounter (HOSPITAL_COMMUNITY): Payer: Self-pay

## 2019-04-16 ENCOUNTER — Encounter (HOSPITAL_COMMUNITY)
Admission: RE | Admit: 2019-04-16 | Discharge: 2019-04-16 | Disposition: A | Discharge status: Home / Self Care | Payer: Medicare HMO | Source: Ambulatory Visit | Attending: Cardiovascular Disease | Admitting: Cardiovascular Disease

## 2019-04-16 ENCOUNTER — Other Ambulatory Visit: Payer: Self-pay

## 2019-04-16 DIAGNOSIS — I350 Nonrheumatic aortic (valve) stenosis: Secondary | ICD-10-CM

## 2019-04-16 DIAGNOSIS — Z01818 Encounter for other preprocedural examination: Secondary | ICD-10-CM | POA: Insufficient documentation

## 2019-04-16 DIAGNOSIS — Z7902 Long term (current) use of antithrombotics/antiplatelets: Secondary | ICD-10-CM | POA: Insufficient documentation

## 2019-04-16 DIAGNOSIS — Z20828 Contact with and (suspected) exposure to other viral communicable diseases: Secondary | ICD-10-CM | POA: Insufficient documentation

## 2019-04-16 DIAGNOSIS — R9431 Abnormal electrocardiogram [ECG] [EKG]: Secondary | ICD-10-CM | POA: Insufficient documentation

## 2019-04-16 LAB — URINALYSIS, ROUTINE W REFLEX MICROSCOPIC
Bilirubin Urine: NEGATIVE
Glucose, UA: 50 mg/dL — AB
Hgb urine dipstick: NEGATIVE
Ketones, ur: NEGATIVE mg/dL
Nitrite: NEGATIVE
Protein, ur: 30 mg/dL — AB
Specific Gravity, Urine: 1.027 (ref 1.005–1.030)
pH: 5 (ref 5.0–8.0)

## 2019-04-16 LAB — GLUCOSE, CAPILLARY: Glucose-Capillary: 240 mg/dL — ABNORMAL HIGH (ref 70–99)

## 2019-04-16 LAB — COMPREHENSIVE METABOLIC PANEL
ALT: 18 U/L (ref 0–44)
AST: 17 U/L (ref 15–41)
Albumin: 3.3 g/dL — ABNORMAL LOW (ref 3.5–5.0)
Alkaline Phosphatase: 113 U/L (ref 38–126)
Anion gap: 10 (ref 5–15)
BUN: 11 mg/dL (ref 8–23)
CO2: 19 mmol/L — ABNORMAL LOW (ref 22–32)
Calcium: 9 mg/dL (ref 8.9–10.3)
Chloride: 112 mmol/L — ABNORMAL HIGH (ref 98–111)
Creatinine, Ser: 0.81 mg/dL (ref 0.44–1.00)
GFR calc Af Amer: >60 mL/min (ref >60)
GFR calc non Af Amer: >60 mL/min (ref >60)
Glucose, Bld: 242 mg/dL — ABNORMAL HIGH (ref 70–99)
Potassium: 4.2 mmol/L (ref 3.5–5.1)
Sodium: 141 mmol/L (ref 135–145)
Total Bilirubin: 0.7 mg/dL (ref 0.3–1.2)
Total Protein: 6.6 g/dL (ref 6.5–8.1)

## 2019-04-16 LAB — CBC
HCT: 31.1 % — ABNORMAL LOW (ref 36.0–46.0)
Hemoglobin: 9.8 g/dL — ABNORMAL LOW (ref 12.0–15.0)
MCH: 29.2 pg (ref 26.0–34.0)
MCHC: 31.5 g/dL (ref 30.0–36.0)
MCV: 92.6 fL (ref 80.0–100.0)
Platelets: 149 10*3/uL — ABNORMAL LOW (ref 150–400)
RBC: 3.36 MIL/uL — ABNORMAL LOW (ref 3.87–5.11)
RDW: 15 % (ref 11.5–15.5)
WBC: 9.6 10*3/uL (ref 4.0–10.5)
nRBC: 0 % (ref 0.0–0.2)

## 2019-04-16 LAB — BLOOD GAS, ARTERIAL
Acid-base deficit: 2.3 mmol/L — ABNORMAL HIGH (ref 0.0–2.0)
Bicarbonate: 21.7 mmol/L (ref 20.0–28.0)
Drawn by: 42180
FIO2: 21
O2 Saturation: 98.7 %
Patient temperature: 98.6
pCO2 arterial: 35.3 mmHg (ref 32.0–48.0)
pH, Arterial: 7.406 (ref 7.350–7.450)
pO2, Arterial: 116 mmHg — ABNORMAL HIGH (ref 83.0–108.0)

## 2019-04-16 LAB — SURGICAL PCR SCREEN
MRSA, PCR: NEGATIVE
Staphylococcus aureus: NEGATIVE

## 2019-04-16 LAB — BRAIN NATRIURETIC PEPTIDE: B Natriuretic Peptide: 132 pg/mL — ABNORMAL HIGH (ref 0.0–100.0)

## 2019-04-16 LAB — TYPE AND SCREEN
ABO/RH(D): O POS
Antibody Screen: NEGATIVE

## 2019-04-16 LAB — PROTIME-INR
INR: 1.1 (ref 0.8–1.2)
Prothrombin Time: 13.9 seconds (ref 11.4–15.2)

## 2019-04-16 LAB — APTT: aPTT: 36 seconds (ref 24–36)

## 2019-04-16 NOTE — Progress Notes (Addendum)
PCP: Dr. [name] Cardiologist: Dr. 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  Reports she has received 2 iron infusions, on 03/05/2019 and 03/12/2019.  Fasting Blood Sugar- Unsure Checks Blood Sugar rarely, "only when I feel off" Reports PCP stopped Metformin because she "was doing so good." Reports this was 3-4 months ago, but due to trying to limit going out in public, has not been re-evaluated.  Repeat A1C today.  Reports she HAS NOT BEEN TAKING PLAVIX.  Thinks she possibly had last dose 6 weeks ago.  Stopped since "she didn't know what it was for."  Called surgeon's office to make aware, left message  Also reports L foot pain, fractured back in March, originally evaluated by PCP, told it was fractured from Xray, but only told to wear a boot.  No other further f/u.  Then, 3 weeks ago, dropped a bottle of water on L foot, and has pain ever since.  Foot assessed during appointment, slightly swollen, no bruising or redness, tender to touch when palpated.  Called surgeon's office to make aware, left message  Patient denies shortness of breath, fever, cough, and chest pain at PAT appointment.  Patient verbalized understanding of instructions provided today at the PAT appointment.  Patient asked to review instructions at home and day of surgery.    RETURNED call from [facility] at Dr. Camillia Herter  Office.  Recommended f/u from PCP regarding foot but would not be an issue for surgery.  Urged pt to resume Plavix since she does have stents, just not take the DOS.  Medication education provided during PAT appt, pt states she will resume Plavix.   Chart sent to anesthesia.

## 2019-04-16 NOTE — Progress Notes (Signed)
Called and left msg with Katie, structural heart team, regarding latest Hbg and UA sample.

## 2019-04-16 NOTE — Progress Notes (Signed)
[id number]: [phone]: [phone] [facility]Phone: [phone]: 3[place] - [address] [id number]: [phone]: [phone]     Your procedure is scheduled on Tuesday, 04/20/2019.  [facility]., and check in at the Admitting office.  Call this number if you have problems the morning of surgery:  [phone] Call [phone] you have any questions prior to your surgery date Monday-Friday 8am-4pm    Remember:  Do not eat or drink after midnight the night before your surgery    Take these medicines the morning of surgery with A SIP OF WATER: NONE   WHAT DO I DO ABOUT MY DIABETES MEDICATION?  .Marland KitchenDo not take oral diabetes medicines (pills) the morning of surgery.   How to Manage Your Diabetes Before and After Surgery  Why is it important to control my blood sugar before and after surgery? . Improving blood sugar levels before and after surgery helps healing and can limit problems. . A way of improving blood sugar control is eating a healthy diet by: o  Eating less sugar and carbohydrates o  Increasing activity/exercise o  Talking with your doctor about reaching your blood sugar goals . High blood sugars (greater than 180 mg/dL) can raise your risk of infections and slow your recovery, so you will need to focus on controlling your diabetes during the weeks before surgery. . Make sure that the doctor who takes care of your diabetes knows about your planned surgery including the date and location.  How do I manage my blood sugar before surgery? . Check your blood sugar at least 4 times a  day, starting 2 days before surgery, to make sure that the level is not too high or low. o Check your blood sugar the morning of your surgery when you wake up and every 2 hours until you get to the Short Stay unit. . If your blood sugar is less than 70 mg/dL, you will need to treat for low blood sugar: o Do not take insulin. o Treat a low blood sugar (less than 70 mg/dL) with  cup of clear juice (cranberry or apple), 4 glucose tablets, OR glucose gel. o Recheck blood sugar in 15 minutes after treatment (to make sure it is greater than 70 mg/dL). If your blood sugar is not greater than 70 mg/dL on recheck, call [phone] further instructions. . Report your blood sugar to the short stay nurse when you get to Short Stay.  . If you are admitted to the hospital after surgery: o Your blood sugar will be checked by the staff and you will probably be given insulin after surgery (instead of oral diabetes medicines) to make sure you have good blood sugar levels. o The goal for blood sugar control after surgery is 80-180 mg/dL.    The Morning of Surgery  Do not wear jewelry, make-up or nail polish.  Do not wear lotions, powders, or perfumes/colognes, or deodorant  Do not shave 48 hours prior to surgery.    Do not bring valuables to the hospital.  [facility] not responsible for any belongings or valuables.  If you are a smoker, DO NOT Smoke 24 hours prior to surgery IF you wear a CPAP at night please bring your mask, tubing, and machine the morning of surgery   Remember that you must have someone to transport you home after your surgery, and remain with you for 24 hours if you are discharged the same day.   Contacts, glasses, hearing aids, dentures or bridgework Zimmerle not be worn into surgery.    Leave your suitcase in the car.  After surgery it Lefkowitz be brought to your room.  For patients admitted to the hospital, discharge time will be determined by your treatment team.  Patients  discharged the day of surgery will not be allowed to drive home.    Special instructions:   [facility]- Preparing For Surgery  Before surgery, you can play an important role. Because skin is not sterile, your skin needs to be as free of germs as possible. You can reduce the number of germs on your skin by washing with CHG (chlorahexidine gluconate) Soap before surgery.  CHG is an antiseptic cleaner which kills germs and bonds with the skin to continue killing germs even after washing.    Oral Hygiene is also important to reduce your risk of infection.  Remember - BRUSH YOUR TEETH THE MORNING OF SURGERY WITH YOUR REGULAR TOOTHPASTE  Please do not use if you have an allergy to CHG or antibacterial soaps. If your skin becomes reddened/irritated stop using the CHG.  Do not shave (including legs and underarms) for at least 48 hours prior to first CHG shower. It is OK to shave your face.  Please follow these instructions carefully.   1. Shower the NIGHT BEFORE SURGERY and the MORNING OF SURGERY with CHG Soap.   2. If you chose to wash your hair, wash your hair first as usual with your normal shampoo.  3. After you shampoo, rinse your hair and body thoroughly to remove the shampoo.  4. Use CHG as you would any other liquid soap. You can apply CHG directly to the skin and wash gently with a scrungie or a clean washcloth.   5. Apply the CHG Soap to your body ONLY FROM THE NECK DOWN.  Do not use on open wounds or open sores. Avoid contact with your eyes, ears, mouth and genitals (private parts). Wash Face and genitals (private parts)  with your normal soap.   6. Wash thoroughly, paying special attention to the area where your surgery will be performed.  7. Thoroughly rinse your body with warm water from the neck down.  8. DO NOT shower/wash with your normal soap after using and rinsing off the CHG Soap.  9. Pat yourself dry with a CLEAN TOWEL.  10. Wear CLEAN PAJAMAS to bed the night before  surgery, wear comfortable clothes the morning of surgery  11. Place CLEAN SHEETS on your bed the night of your first shower and DO NOT SLEEP WITH PETS.    Day of Surgery:  Do not apply any deodorants/lotions. Please shower the morning of surgery with the CHG soap  Please wear clean clothes to the hospital/surgery center.   Remember to brush your teeth WITH YOUR REGULAR TOOTHPASTE.   Please read over the following fact sheets that you were given.

## 2019-04-17 LAB — HEMOGLOBIN A1C
Hgb A1c MFr Bld: 6.1 % — ABNORMAL HIGH (ref 4.8–5.6)
Mean Plasma Glucose: 128 mg/dL

## 2019-04-18 LAB — NOVEL CORONAVIRUS, NAA (HOSP ORDER, SEND-OUT TO REF LAB; TAT 18-24 HRS): SARS-CoV-2, NAA: NOT DETECTED

## 2019-04-19 MED ORDER — POTASSIUM CHLORIDE 2 MEQ/ML IV SOLN
80.0000 meq | INTRAVENOUS | Status: DC
  Filled 2019-04-19: qty 40

## 2019-04-19 MED ORDER — SODIUM CHLORIDE 0.9 % IV SOLN
INTRAVENOUS | Status: DC
  Filled 2019-04-19: qty 30

## 2019-04-19 MED ORDER — NOREPINEPHRINE 4 MG/250ML-% IV SOLN
0.0000 ug/min | INTRAVENOUS | Status: AC
  Administered 2019-04-20: 1 ug/min via INTRAVENOUS
  Filled 2019-04-19: qty 250

## 2019-04-19 MED ORDER — MAGNESIUM SULFATE 50 % IJ SOLN
40.0000 meq | INTRAMUSCULAR | Status: DC
  Filled 2019-04-19: qty 9.85

## 2019-04-19 MED ORDER — VANCOMYCIN HCL 10 G IV SOLR
1500.0000 mg | INTRAVENOUS | Status: AC
  Administered 2019-04-20: 1500 mg via INTRAVENOUS
  Filled 2019-04-19: qty 1500

## 2019-04-19 MED ORDER — DEXMEDETOMIDINE HCL IN NACL 400 MCG/100ML IV SOLN
0.1000 ug/kg/h | INTRAVENOUS | Status: AC
  Administered 2019-04-20: 6 ug/kg/h via INTRAVENOUS
  Filled 2019-04-19: qty 100

## 2019-04-19 MED ORDER — LEVOFLOXACIN IN D5W 500 MG/100ML IV SOLN
500.0000 mg | INTRAVENOUS | Status: AC
  Administered 2019-04-20: 500 mg via INTRAVENOUS
  Filled 2019-04-19: qty 100

## 2019-04-19 NOTE — Anesthesia Preprocedure Evaluation (Addendum)
Anesthesia Evaluation  Patient identified by MRN, date of birth, ID band Patient awake    Reviewed: Allergy & Precautions, H&P , NPO status , Patient's Chart, lab work & pertinent test results  Airway Mallampati: I   Neck ROM: full    Dental   Pulmonary Current Smoker,    breath sounds clear to auscultation       Cardiovascular hypertension, + CAD  + Valvular Problems/Murmurs AS  Rhythm:regular Rate:Normal     Neuro/Psych PSYCHIATRIC DISORDERS Anxiety Depression Dementia    GI/Hepatic (+) Cirrhosis       , Hepatitis -Portal hypertension. NASH   Endo/Other  diabetesHypothyroidism   Renal/GU      Musculoskeletal   Abdominal   Peds  Hematology   Anesthesia Other Findings   Reproductive/Obstetrics                            Anesthesia Physical Anesthesia Plan  ASA: IV  Anesthesia Plan: MAC   Post-op Pain Management:    Induction: Intravenous  PONV Risk Score and Plan: 1 and Ondansetron, Dexamethasone and Treatment Woolf vary due to age or medical condition  Airway Management Planned: Simple Face Mask  Additional Equipment: Arterial line  Intra-op Plan:   Post-operative Plan:   Informed Consent: I have reviewed the patients History and Physical, chart, labs and discussed the procedure including the risks, benefits and alternatives for the proposed anesthesia with the patient or authorized representative who has indicated his/her understanding and acceptance.       Plan Discussed with: CRNA, Anesthesiologist and Surgeon  Anesthesia Plan Comments: (PAT note written 04/19/2019 by Myra Gianotti, PA-C. )       Anesthesia Quick Evaluation

## 2019-04-19 NOTE — Progress Notes (Signed)
Anesthesia follow-up:  See my previous PAT note from encounter 02/26/19. Patient's TAVR postponed until 04/20/19 due to anemia (H/H 7.6/23.9). Since then patient has received 2 iron infusions on 03/05/19 and 87/20. Repeat H/H 04/16/19 up to 9.8/31.1. PLT count 149K.  Last Plavix 6 weeks ago. She reportedly didn't know why she was taking. Also reported left foot pain after bottle of water dropped on her previously fractured foot from March. (PAT RN notified surgeon's office of both the Plavix and foot pain, and Nell Range, PA-C advised her to follow-up with PCP regarding her foot--but would not interfere with TAVR plans, and urged to resume Plavix due to DES placed 02/11/19 except not take on the day of her TAVR.)   BP (!) 128/51   Pulse 68   Temp 37.2 C (Oral)   Resp 18   Ht 5' 4"  (1.626 m)   Wt 95.8 kg   SpO2 100%   BMI 36.27 kg/m    LABS include: Lab Results  Component Value Date   WBC 9.6 04/16/2019   HGB 9.8 (L) 04/16/2019   HCT 31.1 (L) 04/16/2019   PLT 149 (L) 04/16/2019   GLUCOSE 242 (H) 04/16/2019   ALT 18 04/16/2019   AST 17 04/16/2019   NA 141 04/16/2019   K 4.2 04/16/2019   CL 112 (H) 04/16/2019   CREATININE 0.81 04/16/2019   BUN 11 04/16/2019   CO2 19 (L) 04/16/2019   INR 1.1 04/16/2019   HGBA1C 6.1 (H) 04/16/2019   Negative COVID-19 test on 04/16/19.   CXR 04/16/19:  FINDINGS: The heart size and mediastinal contours are within normal limits. Both lungs are clear. The visualized skeletal structures are unremarkable. IMPRESSION: No active cardiopulmonary disease.   EKG 04/16/19: Normal sinus rhythm Left ventricular hypertrophy with repolarization abnormality Abnormal ECG No significant change since last tracing Confirmed by Skeet Latch [phone]) on 04/18/2019 5:48:02 PM   CV:  Outlined in my previous PAT note.   If no acute changes then I anticipate that she can proceed as planned.   Myra Gianotti, PA-C Surgical Short  Stay/Anesthesiology [facility] Phone [phone] [facility] Phone [phone] 04/19/2019 10:44 AM

## 2019-04-20 ENCOUNTER — Encounter (HOSPITAL_COMMUNITY)
Admission: RE | Disposition: A | Discharge status: Home / Self Care | Payer: Medicare HMO | Source: Home / Self Care | Attending: Cardiovascular Disease

## 2019-04-20 ENCOUNTER — Other Ambulatory Visit: Payer: Self-pay | Admitting: Physician Assistant

## 2019-04-20 ENCOUNTER — Inpatient Hospital Stay (HOSPITAL_COMMUNITY): Payer: Medicare HMO | Admitting: Certified Registered"

## 2019-04-20 ENCOUNTER — Inpatient Hospital Stay (HOSPITAL_COMMUNITY)
Admission: RE | Admit: 2019-04-20 | Discharge: 2019-04-21 | DRG: 266 | Disposition: A | Discharge status: Home / Self Care | Payer: Medicare HMO | Attending: Cardiovascular Disease | Admitting: Cardiovascular Disease

## 2019-04-20 ENCOUNTER — Inpatient Hospital Stay (HOSPITAL_COMMUNITY): Payer: Medicare HMO

## 2019-04-20 ENCOUNTER — Encounter (HOSPITAL_COMMUNITY): Payer: Self-pay | Admitting: *Deleted

## 2019-04-20 ENCOUNTER — Other Ambulatory Visit: Payer: Self-pay

## 2019-04-20 DIAGNOSIS — Z801 Family history of malignant neoplasm of trachea, bronchus and lung: Secondary | ICD-10-CM

## 2019-04-20 DIAGNOSIS — Z820 Family history of epilepsy and other diseases of the nervous system: Secondary | ICD-10-CM

## 2019-04-20 DIAGNOSIS — Z7902 Long term (current) use of antithrombotics/antiplatelets: Secondary | ICD-10-CM

## 2019-04-20 DIAGNOSIS — I10 Essential (primary) hypertension: Secondary | ICD-10-CM | POA: Diagnosis present

## 2019-04-20 DIAGNOSIS — I350 Nonrheumatic aortic (valve) stenosis: Principal | ICD-10-CM | POA: Diagnosis present

## 2019-04-20 DIAGNOSIS — Z6836 Body mass index (BMI) 36.0-36.9, adult: Secondary | ICD-10-CM

## 2019-04-20 DIAGNOSIS — D696 Thrombocytopenia, unspecified: Secondary | ICD-10-CM | POA: Diagnosis not present

## 2019-04-20 DIAGNOSIS — Z881 Allergy status to other antibiotic agents status: Secondary | ICD-10-CM

## 2019-04-20 DIAGNOSIS — Z006 Encounter for examination for normal comparison and control in clinical research program: Secondary | ICD-10-CM

## 2019-04-20 DIAGNOSIS — I8500 Esophageal varices without bleeding: Secondary | ICD-10-CM | POA: Diagnosis present

## 2019-04-20 DIAGNOSIS — Z79890 Hormone replacement therapy (postmenopausal): Secondary | ICD-10-CM

## 2019-04-20 DIAGNOSIS — F17210 Nicotine dependence, cigarettes, uncomplicated: Secondary | ICD-10-CM | POA: Diagnosis present

## 2019-04-20 DIAGNOSIS — I8510 Secondary esophageal varices without bleeding: Secondary | ICD-10-CM | POA: Diagnosis not present

## 2019-04-20 DIAGNOSIS — I5033 Acute on chronic diastolic (congestive) heart failure: Secondary | ICD-10-CM | POA: Diagnosis not present

## 2019-04-20 DIAGNOSIS — K7469 Other cirrhosis of liver: Secondary | ICD-10-CM | POA: Diagnosis present

## 2019-04-20 DIAGNOSIS — E785 Hyperlipidemia, unspecified: Secondary | ICD-10-CM | POA: Diagnosis present

## 2019-04-20 DIAGNOSIS — Z7982 Long term (current) use of aspirin: Secondary | ICD-10-CM

## 2019-04-20 DIAGNOSIS — I081 Rheumatic disorders of both mitral and tricuspid valves: Secondary | ICD-10-CM | POA: Diagnosis not present

## 2019-04-20 DIAGNOSIS — I25110 Atherosclerotic heart disease of native coronary artery with unstable angina pectoris: Secondary | ICD-10-CM | POA: Diagnosis not present

## 2019-04-20 DIAGNOSIS — Z8249 Family history of ischemic heart disease and other diseases of the circulatory system: Secondary | ICD-10-CM

## 2019-04-20 DIAGNOSIS — I110 Hypertensive heart disease with heart failure: Secondary | ICD-10-CM | POA: Diagnosis not present

## 2019-04-20 DIAGNOSIS — F0390 Unspecified dementia without behavioral disturbance: Secondary | ICD-10-CM | POA: Diagnosis present

## 2019-04-20 DIAGNOSIS — I2510 Atherosclerotic heart disease of native coronary artery without angina pectoris: Secondary | ICD-10-CM | POA: Diagnosis present

## 2019-04-20 DIAGNOSIS — E079 Disorder of thyroid, unspecified: Secondary | ICD-10-CM | POA: Diagnosis present

## 2019-04-20 DIAGNOSIS — Z952 Presence of prosthetic heart valve: Secondary | ICD-10-CM | POA: Diagnosis not present

## 2019-04-20 DIAGNOSIS — Z79899 Other long term (current) drug therapy: Secondary | ICD-10-CM

## 2019-04-20 DIAGNOSIS — Z954 Presence of other heart-valve replacement: Secondary | ICD-10-CM | POA: Diagnosis not present

## 2019-04-20 DIAGNOSIS — R911 Solitary pulmonary nodule: Secondary | ICD-10-CM | POA: Diagnosis present

## 2019-04-20 DIAGNOSIS — Z23 Encounter for immunization: Secondary | ICD-10-CM | POA: Diagnosis not present

## 2019-04-20 DIAGNOSIS — K3189 Other diseases of stomach and duodenum: Secondary | ICD-10-CM | POA: Diagnosis present

## 2019-04-20 DIAGNOSIS — D509 Iron deficiency anemia, unspecified: Secondary | ICD-10-CM | POA: Diagnosis present

## 2019-04-20 DIAGNOSIS — F419 Anxiety disorder, unspecified: Secondary | ICD-10-CM | POA: Diagnosis present

## 2019-04-20 DIAGNOSIS — K7581 Nonalcoholic steatohepatitis (NASH): Secondary | ICD-10-CM | POA: Diagnosis present

## 2019-04-20 DIAGNOSIS — I447 Left bundle-branch block, unspecified: Secondary | ICD-10-CM | POA: Diagnosis not present

## 2019-04-20 DIAGNOSIS — Z886 Allergy status to analgesic agent status: Secondary | ICD-10-CM

## 2019-04-20 DIAGNOSIS — E119 Type 2 diabetes mellitus without complications: Secondary | ICD-10-CM | POA: Diagnosis not present

## 2019-04-20 DIAGNOSIS — Z9114 Patient's other noncompliance with medication regimen: Secondary | ICD-10-CM

## 2019-04-20 DIAGNOSIS — Z720 Tobacco use: Secondary | ICD-10-CM | POA: Diagnosis present

## 2019-04-20 DIAGNOSIS — I442 Atrioventricular block, complete: Secondary | ICD-10-CM | POA: Diagnosis not present

## 2019-04-20 DIAGNOSIS — E669 Obesity, unspecified: Secondary | ICD-10-CM | POA: Diagnosis present

## 2019-04-20 DIAGNOSIS — Z880 Allergy status to penicillin: Secondary | ICD-10-CM

## 2019-04-20 DIAGNOSIS — Z955 Presence of coronary angioplasty implant and graft: Secondary | ICD-10-CM

## 2019-04-20 DIAGNOSIS — Z8719 Personal history of other diseases of the digestive system: Secondary | ICD-10-CM

## 2019-04-20 DIAGNOSIS — K766 Portal hypertension: Secondary | ICD-10-CM | POA: Diagnosis present

## 2019-04-20 DIAGNOSIS — Z888 Allergy status to other drugs, medicaments and biological substances status: Secondary | ICD-10-CM

## 2019-04-20 HISTORY — DX: Presence of prosthetic heart valve: Z95.2

## 2019-04-20 HISTORY — PX: TRANSCATHETER AORTIC VALVE REPLACEMENT, TRANSFEMORAL: SHX6400

## 2019-04-20 HISTORY — PX: TEE WITHOUT CARDIOVERSION: SHX5443

## 2019-04-20 HISTORY — DX: Tobacco use: Z72.0

## 2019-04-20 LAB — POCT I-STAT, CHEM 8
BUN: 12 mg/dL (ref 8–23)
BUN: 16 mg/dL (ref 8–23)
Calcium, Ion: 1.18 mmol/L (ref 1.15–1.40)
Calcium, Ion: 1.26 mmol/L (ref 1.15–1.40)
Chloride: 105 mmol/L (ref 98–111)
Chloride: 109 mmol/L (ref 98–111)
Creatinine, Ser: 0.7 mg/dL (ref 0.44–1.00)
Creatinine, Ser: 0.8 mg/dL (ref 0.44–1.00)
Glucose, Bld: 272 mg/dL — ABNORMAL HIGH (ref 70–99)
Glucose, Bld: 297 mg/dL — ABNORMAL HIGH (ref 70–99)
HCT: 21 % — ABNORMAL LOW (ref 36.0–46.0)
HCT: 24 % — ABNORMAL LOW (ref 36.0–46.0)
Hemoglobin: 7.1 g/dL — ABNORMAL LOW (ref 12.0–15.0)
Hemoglobin: 8.2 g/dL — ABNORMAL LOW (ref 12.0–15.0)
Potassium: 3.9 mmol/L (ref 3.5–5.1)
Potassium: 4.4 mmol/L (ref 3.5–5.1)
Sodium: 139 mmol/L (ref 135–145)
Sodium: 141 mmol/L (ref 135–145)
TCO2: 21 mmol/L — ABNORMAL LOW (ref 22–32)
TCO2: 23 mmol/L (ref 22–32)

## 2019-04-20 LAB — POCT I-STAT 4, (NA,K, GLUC, HGB,HCT)
Glucose, Bld: 284 mg/dL — ABNORMAL HIGH (ref 70–99)
Glucose, Bld: 307 mg/dL — ABNORMAL HIGH (ref 70–99)
HCT: 24 % — ABNORMAL LOW (ref 36.0–46.0)
HCT: 22 % — ABNORMAL LOW (ref 36.0–46.0)
Hemoglobin: 8.2 g/dL — ABNORMAL LOW (ref 12.0–15.0)
Hemoglobin: 7.5 g/dL — ABNORMAL LOW (ref 12.0–15.0)
Potassium: 3.9 mmol/L (ref 3.5–5.1)
Potassium: 4.1 mmol/L (ref 3.5–5.1)
Sodium: 139 mmol/L (ref 135–145)
Sodium: 138 mmol/L (ref 135–145)

## 2019-04-20 LAB — GLUCOSE, CAPILLARY
Glucose-Capillary: 269 mg/dL — ABNORMAL HIGH (ref 70–99)
Glucose-Capillary: 274 mg/dL — ABNORMAL HIGH (ref 70–99)
Glucose-Capillary: 145 mg/dL — ABNORMAL HIGH (ref 70–99)

## 2019-04-20 SURGERY — IMPLANTATION, AORTIC VALVE, TRANSCATHETER, FEMORAL APPROACH
Anesthesia: Monitor Anesthesia Care | Site: Chest

## 2019-04-20 MED ORDER — CHLORHEXIDINE GLUCONATE 4 % EX LIQD: 60.0000 mL | Freq: Once | CUTANEOUS | Status: DC

## 2019-04-20 MED ORDER — LIDOCAINE HCL 1 % IJ SOLN
INTRAMUSCULAR | Status: AC
  Filled 2019-04-20: qty 20

## 2019-04-20 MED ORDER — SODIUM CHLORIDE 0.9 % IV SOLN
INTRAVENOUS | Status: AC
  Filled 2019-04-20 (×3): qty 1.2

## 2019-04-20 MED ORDER — ACETAMINOPHEN 325 MG PO TABS: 650.0000 mg | ORAL_TABLET | Freq: Four times a day (QID) | ORAL | Status: DC | PRN

## 2019-04-20 MED ORDER — CHLORHEXIDINE GLUCONATE CLOTH 2 % EX PADS
6.0000 | MEDICATED_PAD | Freq: Every day | CUTANEOUS | Status: DC
  Administered 2019-04-20: 6 via TOPICAL

## 2019-04-20 MED ORDER — FENTANYL CITRATE (PF) 250 MCG/5ML IJ SOLN
INTRAMUSCULAR | Status: DC | PRN
  Administered 2019-04-20 (×2): 50 ug via INTRAVENOUS

## 2019-04-20 MED ORDER — PROPOFOL 500 MG/50ML IV EMUL
INTRAVENOUS | Status: DC | PRN
  Administered 2019-04-20: 25 ug/kg/min via INTRAVENOUS

## 2019-04-20 MED ORDER — ZOLPIDEM TARTRATE 5 MG PO TABS
5.0000 mg | ORAL_TABLET | Freq: Every evening | ORAL | Status: DC | PRN
  Administered 2019-04-20: 22:00:00 5 mg via ORAL
  Filled 2019-04-20: qty 1

## 2019-04-20 MED ORDER — LEVOTHYROXINE SODIUM 75 MCG PO TABS
75.0000 ug | ORAL_TABLET | Freq: Every day | ORAL | Status: DC
  Administered 2019-04-21: 75 ug via ORAL
  Filled 2019-04-20: qty 1

## 2019-04-20 MED ORDER — SODIUM CHLORIDE 0.9% FLUSH: 3.0000 mL | INTRAVENOUS | Status: DC | PRN

## 2019-04-20 MED ORDER — NITROGLYCERIN IN D5W 200-5 MCG/ML-% IV SOLN
0.0000 ug/min | INTRAVENOUS | Status: DC
  Administered 2019-04-21: 5 ug/min via INTRAVENOUS
  Filled 2019-04-20: qty 250

## 2019-04-20 MED ORDER — LEVOFLOXACIN IN D5W 750 MG/150ML IV SOLN
750.0000 mg | INTRAVENOUS | Status: AC
  Administered 2019-04-21: 10:00:00 750 mg via INTRAVENOUS
  Filled 2019-04-20: qty 150

## 2019-04-20 MED ORDER — TRAMADOL HCL 50 MG PO TABS
50.0000 mg | ORAL_TABLET | ORAL | Status: DC | PRN
  Administered 2019-04-20 (×2): 50 mg via ORAL
  Filled 2019-04-20 (×3): qty 1

## 2019-04-20 MED ORDER — CHLORHEXIDINE GLUCONATE 4 % EX LIQD: 30.0000 mL | CUTANEOUS | Status: DC

## 2019-04-20 MED ORDER — SODIUM CHLORIDE 0.9 % IV SOLN
INTRAVENOUS | Status: DC | PRN
  Administered 2019-04-20: 1500 mL via INTRAMUSCULAR

## 2019-04-20 MED ORDER — 0.9 % SODIUM CHLORIDE (POUR BTL) OPTIME
TOPICAL | Status: DC | PRN
  Administered 2019-04-20: 09:00:00 1000 mL

## 2019-04-20 MED ORDER — LACTATED RINGERS IV SOLN
INTRAVENOUS | Status: DC | PRN
  Administered 2019-04-20 (×2): via INTRAVENOUS

## 2019-04-20 MED ORDER — MORPHINE SULFATE (PF) 2 MG/ML IV SOLN: 1.0000 mg | INTRAVENOUS | Status: DC | PRN

## 2019-04-20 MED ORDER — CLOPIDOGREL BISULFATE 75 MG PO TABS
75.0000 mg | ORAL_TABLET | Freq: Every day | ORAL | Status: DC
  Administered 2019-04-21: 75 mg via ORAL
  Filled 2019-04-20: qty 1

## 2019-04-20 MED ORDER — SODIUM CHLORIDE 0.9 % IV SOLN
INTRAVENOUS | Status: AC
  Administered 2019-04-20 (×2): via INTRAVENOUS

## 2019-04-20 MED ORDER — PNEUMOCOCCAL VAC POLYVALENT 25 MCG/0.5ML IJ INJ
0.5000 mL | INJECTION | INTRAMUSCULAR | Status: AC
  Administered 2019-04-21: 10:00:00 0.5 mL via INTRAMUSCULAR
  Filled 2019-04-20: qty 0.5

## 2019-04-20 MED ORDER — PHENYLEPHRINE HCL-NACL 20-0.9 MG/250ML-% IV SOLN: 0.0000 ug/min | INTRAVENOUS | Status: DC

## 2019-04-20 MED ORDER — ATORVASTATIN CALCIUM 10 MG PO TABS
20.0000 mg | ORAL_TABLET | Freq: Every day | ORAL | Status: DC
  Administered 2019-04-20 – 2019-04-21 (×2): 20 mg via ORAL
  Filled 2019-04-20 (×2): qty 2

## 2019-04-20 MED ORDER — TRAZODONE HCL 50 MG PO TABS: 50.0000 mg | ORAL_TABLET | Freq: Every evening | ORAL | Status: DC | PRN

## 2019-04-20 MED ORDER — SODIUM CHLORIDE 0.9 % IV SOLN: INTRAVENOUS | Status: DC

## 2019-04-20 MED ORDER — ONDANSETRON HCL 4 MG/2ML IJ SOLN
4.0000 mg | Freq: Four times a day (QID) | INTRAMUSCULAR | Status: DC | PRN
  Administered 2019-04-21: 4 mg via INTRAVENOUS
  Filled 2019-04-20: qty 2

## 2019-04-20 MED ORDER — PROTAMINE SULFATE 10 MG/ML IV SOLN
INTRAVENOUS | Status: DC | PRN
  Administered 2019-04-20: 10 mg via INTRAVENOUS
  Administered 2019-04-20: 120 mg via INTRAVENOUS

## 2019-04-20 MED ORDER — PROPOFOL 10 MG/ML IV BOLUS
INTRAVENOUS | Status: DC | PRN
  Administered 2019-04-20: 20 mg via INTRAVENOUS
  Administered 2019-04-20: 30 mg via INTRAVENOUS
  Administered 2019-04-20: 10 mg via INTRAVENOUS

## 2019-04-20 MED ORDER — ASPIRIN EC 81 MG PO TBEC
81.0000 mg | DELAYED_RELEASE_TABLET | Freq: Every day | ORAL | Status: DC
  Administered 2019-04-21: 10:00:00 81 mg via ORAL
  Filled 2019-04-20: qty 1

## 2019-04-20 MED ORDER — EPHEDRINE SULFATE-NACL 50-0.9 MG/10ML-% IV SOSY
PREFILLED_SYRINGE | INTRAVENOUS | Status: DC | PRN
  Administered 2019-04-20: 10 mg via INTRAVENOUS

## 2019-04-20 MED ORDER — VANCOMYCIN HCL IN DEXTROSE 1-5 GM/200ML-% IV SOLN
1000.0000 mg | Freq: Once | INTRAVENOUS | Status: AC
  Administered 2019-04-20: 1000 mg via INTRAVENOUS
  Filled 2019-04-20: qty 200

## 2019-04-20 MED ORDER — SODIUM CHLORIDE 0.9 % IV SOLN
250.0000 mL | INTRAVENOUS | Status: DC | PRN
  Administered 2019-04-20 – 2019-04-21 (×2): 250 mL via INTRAVENOUS

## 2019-04-20 MED ORDER — ONDANSETRON HCL 4 MG/2ML IJ SOLN
INTRAMUSCULAR | Status: DC | PRN
  Administered 2019-04-20: 4 mg via INTRAVENOUS

## 2019-04-20 MED ORDER — INSULIN ASPART 100 UNIT/ML ~~LOC~~ SOLN
0.0000 [IU] | Freq: Three times a day (TID) | SUBCUTANEOUS | Status: DC
  Administered 2019-04-20: 12 [IU] via SUBCUTANEOUS
  Administered 2019-04-20: 23:00:00 2 [IU] via SUBCUTANEOUS
  Administered 2019-04-21 (×2): 4 [IU] via SUBCUTANEOUS
  Administered 2019-04-21: 8 [IU] via SUBCUTANEOUS

## 2019-04-20 MED ORDER — LIDOCAINE HCL 1 % IJ SOLN
INTRAMUSCULAR | Status: DC | PRN
  Administered 2019-04-20: 20 mL via INTRADERMAL

## 2019-04-20 MED ORDER — SODIUM CHLORIDE 0.9% FLUSH
3.0000 mL | Freq: Two times a day (BID) | INTRAVENOUS | Status: DC
  Administered 2019-04-21: 10:00:00 3 mL via INTRAVENOUS

## 2019-04-20 MED ORDER — OXYCODONE HCL 5 MG PO TABS
5.0000 mg | ORAL_TABLET | ORAL | Status: DC | PRN
  Administered 2019-04-20: 22:00:00 10 mg via ORAL
  Administered 2019-04-21: 5 mg via ORAL
  Filled 2019-04-20 (×2): qty 2

## 2019-04-20 MED ORDER — FERROUS SULFATE 325 (65 FE) MG PO TABS
325.0000 mg | ORAL_TABLET | Freq: Every day | ORAL | Status: DC
  Administered 2019-04-21: 10:00:00 325 mg via ORAL
  Filled 2019-04-20: qty 1

## 2019-04-20 MED ORDER — CHLORHEXIDINE GLUCONATE 0.12 % MT SOLN
15.0000 mL | Freq: Once | OROMUCOSAL | Status: AC
  Administered 2019-04-20: 15 mL via OROMUCOSAL
  Filled 2019-04-20: qty 15

## 2019-04-20 MED ORDER — HEPARIN SODIUM (PORCINE) 1000 UNIT/ML IJ SOLN
INTRAMUSCULAR | Status: DC | PRN
  Administered 2019-04-20: 13000 [IU] via INTRAVENOUS

## 2019-04-20 MED ORDER — IODIXANOL 320 MG/ML IV SOLN
INTRAVENOUS | Status: DC | PRN
  Administered 2019-04-20: 55 mL

## 2019-04-20 MED ORDER — ACETAMINOPHEN 650 MG RE SUPP: 650.0000 mg | Freq: Four times a day (QID) | RECTAL | Status: DC | PRN

## 2019-04-20 MED ORDER — MIDAZOLAM HCL 2 MG/2ML IJ SOLN
INTRAMUSCULAR | Status: DC | PRN
  Administered 2019-04-20 (×2): 1 mg via INTRAVENOUS

## 2019-04-20 SURGICAL SUPPLY — 91 items
ADH SKN CLS APL DERMABOND .7 (GAUZE/BANDAGES/DRESSINGS) ×1
APL PRP STRL LF DISP 70% ISPRP (MISCELLANEOUS) ×1
BAG DECANTER FOR FLEXI CONT (MISCELLANEOUS) IMPLANT
BAG SNAP BAND KOVER 36X36 (MISCELLANEOUS) ×3 IMPLANT
BLADE CLIPPER SURG (BLADE) IMPLANT
BLADE OSCILLATING /SAGITTAL (BLADE) IMPLANT
BLADE STERNUM SYSTEM 6 (BLADE) IMPLANT
BLADE SURG 10 STRL SS (BLADE) IMPLANT
CABLE ADAPT CONN TEMP 6FT (ADAPTER) ×3 IMPLANT
CATH DIAG 6FR PIGTAIL (CATHETERS) ×2 IMPLANT
CATH DIAG EXPO 6F AL1 (CATHETERS) IMPLANT
CATH DIAG EXPO 6F VENT PIG 145 (CATHETERS) ×6 IMPLANT
CATH EXTERNAL FEMALE PUREWICK (CATHETERS) IMPLANT
CATH INFINITI 6F AL2 (CATHETERS) IMPLANT
CATH S G BIP PACING (CATHETERS) ×3 IMPLANT
CHLORAPREP W/TINT 26 (MISCELLANEOUS) ×3 IMPLANT
CLIP VESOCCLUDE MED 24/CT (CLIP) IMPLANT
CLIP VESOCCLUDE SM WIDE 24/CT (CLIP) IMPLANT
CLOSURE MYNX CONTROL 6F/7F (Vascular Products) ×2 IMPLANT
CONT SPEC 4OZ CLIKSEAL STRL BL (MISCELLANEOUS) ×6 IMPLANT
COVER BACK TABLE 80X110 HD (DRAPES) ×3 IMPLANT
COVER WAND RF STERILE (DRAPES) ×3 IMPLANT
DECANTER SPIKE VIAL GLASS SM (MISCELLANEOUS) ×3 IMPLANT
DERMABOND ADVANCED (GAUZE/BANDAGES/DRESSINGS) ×2
DERMABOND ADVANCED .7 DNX12 (GAUZE/BANDAGES/DRESSINGS) ×1 IMPLANT
DEVICE CLOSURE PERCLS PRGLD 6F (VASCULAR PRODUCTS) ×2 IMPLANT
DRAPE INCISE IOBAN 66X45 STRL (DRAPES) IMPLANT
DRSG TEGADERM 4X4.75 (GAUZE/BANDAGES/DRESSINGS) ×6 IMPLANT
DRYSEAL FLEXSHEATH 18FR 33CM (SHEATH) ×2
ELECT CAUTERY BLADE 6.4 (BLADE) IMPLANT
ELECT REM PT RETURN 9FT ADLT (ELECTROSURGICAL) ×6
ELECTRODE REM PT RTRN 9FT ADLT (ELECTROSURGICAL) ×2 IMPLANT
FELT TEFLON 6X6 (MISCELLANEOUS) IMPLANT
GAUZE 4X4 16PLY RFD (DISPOSABLE) ×2 IMPLANT
GAUZE SPONGE 4X4 12PLY STRL (GAUZE/BANDAGES/DRESSINGS) ×3 IMPLANT
GLOVE BIO SURGEON STRL SZ7.5 (GLOVE) ×3 IMPLANT
GLOVE BIO SURGEON STRL SZ8 (GLOVE) IMPLANT
GLOVE EUDERMIC 7 POWDERFREE (GLOVE) IMPLANT
GLOVE ORTHO TXT STRL SZ7.5 (GLOVE) IMPLANT
GOWN STRL REUS W/ TWL LRG LVL3 (GOWN DISPOSABLE) IMPLANT
GOWN STRL REUS W/ TWL XL LVL3 (GOWN DISPOSABLE) ×1 IMPLANT
GOWN STRL REUS W/TWL LRG LVL3 (GOWN DISPOSABLE)
GOWN STRL REUS W/TWL XL LVL3 (GOWN DISPOSABLE) ×3
GUIDEWIRE CNFDA BRKR CVD (WIRE) ×2 IMPLANT
GUIDEWIRE SAFE TJ AMPLATZ EXST (WIRE) ×3 IMPLANT
INSERT FOGARTY SM (MISCELLANEOUS) IMPLANT
KIT BASIN OR (CUSTOM PROCEDURE TRAY) ×3 IMPLANT
KIT DILATOR VASC 18G NDL (KITS) ×2 IMPLANT
KIT HEART LEFT (KITS) ×3 IMPLANT
KIT SUCTION CATH 14FR (SUCTIONS) IMPLANT
KIT TURNOVER KIT B (KITS) ×3 IMPLANT
LOOP VESSEL MAXI BLUE (MISCELLANEOUS) IMPLANT
LOOP VESSEL MINI RED (MISCELLANEOUS) IMPLANT
NS IRRIG 1000ML POUR BTL (IV SOLUTION) ×3 IMPLANT
PACK ENDO MINOR (CUSTOM PROCEDURE TRAY) ×3 IMPLANT
PAD ARMBOARD 7.5X6 YLW CONV (MISCELLANEOUS) ×6 IMPLANT
PAD ELECT DEFIB RADIOL ZOLL (MISCELLANEOUS) ×3 IMPLANT
PENCIL BUTTON HOLSTER BLD 10FT (ELECTRODE) IMPLANT
PERCLOSE PROGLIDE 6F (VASCULAR PRODUCTS) ×6
POSITIONER HEAD DONUT 9IN (MISCELLANEOUS) ×3 IMPLANT
SET MICROPUNCTURE 5F STIFF (MISCELLANEOUS) ×3 IMPLANT
SHEATH BRITE TIP 7FR 35CM (SHEATH) ×3 IMPLANT
SHEATH DRYSEAL FLEX 18FR 33CM (SHEATH) IMPLANT
SHEATH PINNACLE 6F 10CM (SHEATH) ×3 IMPLANT
SHEATH PINNACLE 8F 10CM (SHEATH) ×3 IMPLANT
SLEEVE REPOSITIONING LENGTH 30 (MISCELLANEOUS) ×3 IMPLANT
SPONGE LAP 18X18 RF (DISPOSABLE) ×2 IMPLANT
STOPCOCK MORSE 400PSI 3WAY (MISCELLANEOUS) ×6 IMPLANT
SUT ETHIBOND X763 2 0 SH 1 (SUTURE) IMPLANT
SUT GORETEX CV 4 TH 22 36 (SUTURE) IMPLANT
SUT GORETEX CV4 TH-18 (SUTURE) IMPLANT
SUT MNCRL AB 3-0 PS2 18 (SUTURE) IMPLANT
SUT PROLENE 5 0 C 1 36 (SUTURE) IMPLANT
SUT PROLENE 6 0 C 1 30 (SUTURE) IMPLANT
SUT SILK  1 MH (SUTURE) ×2
SUT SILK 1 MH (SUTURE) ×1 IMPLANT
SUT VIC AB 2-0 CT1 27 (SUTURE)
SUT VIC AB 2-0 CT1 TAPERPNT 27 (SUTURE) IMPLANT
SUT VIC AB 2-0 CTX 36 (SUTURE) IMPLANT
SUT VIC AB 3-0 SH 8-18 (SUTURE) IMPLANT
SYR 50ML LL SCALE MARK (SYRINGE) ×3 IMPLANT
SYR BULB IRRIGATION 50ML (SYRINGE) IMPLANT
SYR MEDRAD MARK V 150ML (SYRINGE) ×3 IMPLANT
SYS DEL EVOLUT PROPLS 23 26 29 (CATHETERS) ×3
SYSTEM DEL EVLT PRPLS 23 26 29 (CATHETERS) IMPLANT
TOWEL GREEN STERILE (TOWEL DISPOSABLE) ×6 IMPLANT
TRANSDUCER W/STOPCOCK (MISCELLANEOUS) ×6 IMPLANT
TRAY FOLEY SLVR 16FR TEMP STAT (SET/KITS/TRAYS/PACK) IMPLANT
VALVE AORTIC EVOLT PROPLUS 23 (Valve) ×2 IMPLANT
WIRE EMERALD 3MM-J .035X150CM (WIRE) ×3 IMPLANT
WIRE EMERALD 3MM-J .035X260CM (WIRE) ×3 IMPLANT

## 2019-04-20 NOTE — H&P (Signed)
Admission History and Physical  History of Present Illness: 68 yo female with history of CAD, DM, HTN, HLD, memory issues, tobacco abuse, aortic stenosis, esophageal varices and hepatic cirrhosis who is here today for TAVR. I saw her as a new patient in February 2019 for aortic valve stenosis. Echo December 2018 with normal LV systolic function. The aortic valve was thickened with restricted leaflet excursion, mean gradient 36 mmHg, peak gradient 59 mmHg, AVA 1.22 cm2, DVI 0.39. She had mild dyspnea with exertion with fatigue and mild dizziness at times. Her aortic stenosis was moderate by echo criteria. Echo 01/27/18 with normal LV systolic function, moderately severe AS with mean gradient of 37 mmHg, peak gradient 64 mmHg, AVA 1.17cm2, DVI 0.37. At her visit here in July 2019 she was feeling fatigued but she had also just been found to be anemic. Colonoscopy September 2019 with non bleeding AVMs and diverticular disease. Non-bleeding esophageal varices were seen on upper endoscopy. Echo 08/27/18 with LVEF=60-65%. There is moderately severe aortic stenosis with thickened and calcified valve leaflets. Mean gradient 37 mmHg, peak gradient 66 mmHg, Dimensionless index 0.31. AVA 0.88 cm2. I saw her in the office in February 2020 and she complained of more dyspnea with exertion and fatigue with dizziness. I arranged a cardiac catheterization on 10/07/18 and she was found to have severe three vessel CAD. Her gradients across the aortic valve by cath were consistent with severe AS. I referred her to CT surgery to consider CABG with AVR. She was seen by Dr. Roxy Manns 10/19/18 and not felt to be a good candidate for surgical AVR and CABG due to the small size of her aortic root. She is back today to discuss PCI prior to proceeding with TAVR. CTA chest/ abdomen/pelvis 10/14/18 showed adequate access for TAVR. There is a left lower lobe pulmonary nodule and non-specific nodular appearing areas of ground glass attenuation in the  upper lungs. (3 month f/u recommended). Cardiac CT showed adequate coronary heights with Annulus area of 317 mm2. Abdominal u/s ordered by GI (Dr. [name]) with findings consistent with hepatic cirrhosis. She is followed in the GI clinic with hepatic cirrhosis. She has undergone recent dental extraction pre TAVR. She has had recent worsening chronic anemia  TAVR delayed for iron infusion. Still with ongoing dyspnea.        Past Medical History:  Diagnosis Date   Acute encephalopathy 09/14/2016   Adenomatous polyp    Anxiety    Aortic stenosis    Arrhythmia    Chronic upper GI bleeding    intestinal AVM's, portal gastropathy, esophageal varices on egd 04/2018   Coronary artery disease involving native coronary artery without angina pectoris    Dementia ([place]) 09/16/2016   DIVERTICULITIS OF COLON 06/14/2008   Qualifier: Diagnosis of By: Gretta Cool RN, Malachy Mood    DM (diabetes mellitus), type 2 ([place]) 09/15/2016   Esophageal varices determined by endoscopy ([place])    Fatty liver    Hyperlipidemia    Hypertension    Incidental pulmonary nodule, greater than or equal to 37m 10/14/2018   LLL nodule needs f/u CT or PET-CT scan   Irritable bowel syndrome 04/06/2008   Centricity Description: IBS Qualifier: Diagnosis of By: [name] ([name], [name] Centricity Description: IRRITABLE BOWEL SYNDROME Qualifier: Diagnosis of By: [name], [name]   Liver cirrhosis secondary to NASH [place] 09/16/2016   NASH (nonalcoholic steatohepatitis)    Obesity    Portal hypertensive gastropathy (HCC)    SOB (shortness of breath)  Splenomegaly 09/16/2016   Thrombocytopenia ([place])    Thyroid disease    Vitamin B 12 deficiency 09/16/2016   Vitamin D deficiency         Past Surgical History:  Procedure Laterality Date   COLONOSCOPY WITH PROPOFOL N/A 04/09/2018   Procedure: COLONOSCOPY WITH PROPOFOL; Surgeon: [name], MD; Location: [facility]; Service: Endoscopy; Laterality: N/A;    ESOPHAGOGASTRODUODENOSCOPY (EGD) WITH PROPOFOL N/A 04/09/2018   Procedure: ESOPHAGOGASTRODUODENOSCOPY (EGD) WITH PROPOFOL; Surgeon: [name], MD; Location: [facility]; Service: Endoscopy; Laterality: N/A;   oopherectomy     OVARIAN CYST REMOVAL     POLYPECTOMY  04/09/2018   Procedure: POLYPECTOMY; Surgeon: [name], MD; Location: [facility]; Service: Endoscopy;;   RIGHT/LEFT HEART CATH AND CORONARY ANGIOGRAPHY N/A 10/07/2018   Procedure: RIGHT/LEFT HEART CATH AND CORONARY ANGIOGRAPHY; Surgeon: [name], MD; Location: [place] [facility]; Service: Cardiovascular; Laterality: N/A;         Current Outpatient Medications  Medication Sig Dispense Refill   ACCU-CHEK AVIVA PLUS test strip      ACCU-CHEK SOFTCLIX LANCETS lancets      amLODipine (NORVASC) 10 MG tablet Take 1 tablet (10 mg total) by mouth daily. 90 tablet 3   atorvastatin (LIPITOR) 20 MG tablet TAKE 1 TABLET (20 MG TOTAL) BY MOUTH DAILY. 90 tablet 3   Blood Glucose Monitoring Suppl (ACCU-CHEK AVIVA PLUS) w/Device KIT      erythromycin ophthalmic ointment Place 1 application into the left eye at bedtime. 3.5 g 0   ferrous sulfate 325 (65 FE) MG tablet Take 1 tablet (325 mg total) by mouth daily. 90 tablet 3   fluconazole (DIFLUCAN) 150 MG tablet Take 1 tablet (150 mg total) by mouth once for 1 dose. 1 tablet 0   furosemide (LASIX) 20 MG tablet TAKE 1 TABLET EVERY DAY (DISCONTINUE FUROSEMIDE 40MG TWICE DAILY) 90 tablet 0   levothyroxine (SYNTHROID, LEVOTHROID) 75 MCG tablet TAKE 1 TABLET EVERY DAY (Patient taking differently: Take 75 mcg by mouth daily. ) 90 tablet 3   LORazepam (ATIVAN) 0.5 MG tablet TAKE 1/2 TABLET BY MOUTH EVERY 8 HOURS AS NEEDED 10 tablet 0   losartan (COZAAR) 100 MG tablet Take 1 tablet (100 mg total) by mouth daily. 90 tablet 0   methylcellulose (CITRUCEL) oral powder Take 1 packet by mouth daily as needed (constipation).     pantoprazole (PROTONIX) 40 MG tablet Take 1  tablet (40 mg total) by mouth daily. 90 tablet 3   propranolol (INDERAL) 40 MG tablet Take 1 tablet (40 mg total) by mouth 2 (two) times daily. 180 tablet 0   zolpidem (AMBIEN) 5 MG tablet Take 1 tablet (5 mg total) by mouth at bedtime as needed for sleep. Stop ativan 30 tablet 1   aspirin EC 81 MG tablet Take 1 tablet (81 mg total) by mouth daily. 90 tablet 3   clopidogrel (PLAVIX) 75 MG tablet Take 1 tablet (75 mg total) by mouth daily. 90 tablet 3   potassium chloride SA (K-DUR) 20 MEQ tablet Take 1 tablet (20 mEq total) by mouth daily. 90 tablet 3            Current Facility-Administered Medications  Medication Dose Route Frequency Provider Last Rate Last Dose   cyanocobalamin ((VITAMIN B-12)) injection 1,000 mcg 1,000 mcg Subcutaneous Q30 days [name], MD  1,000 mcg at 09/08/17 1408        Allergies  Allergen Reactions   Celecoxib     Unknown reaction  Prednisone Itching    Heart rate increased   Macrodantin [Nitrofurantoin Macrocrystal] Rash   Penicillins Hives    Did it involve swelling of the face/tongue/throat, SOB, or low BP? Unknown  Did it involve sudden or severe rash/hives, skin peeling, or any reaction on the inside of your mouth or nose? Unknown  Did you need to seek medical attention at a hospital or doctor's office? Unknown  When did it last happen? 20 + years  If all above answers are NO, [place] proceed with cephalosporin use.    Social History        Socioeconomic History   Marital status: Married    Spouse name: Not on file   Number of children: 1   Years of education: Not on file   Highest education level: Not on file  Occupational History   Not on file  Social Needs   Financial resource strain: Not on file   Food insecurity    Worry: Not on file    Inability: Not on file   Transportation needs    Medical: Not on file    Non-medical: Not on file  Tobacco Use   Smoking status: Current Every Day Smoker    Packs/day: 0.50     Types: Cigarettes   Smokeless tobacco: Never Used  Substance and Sexual Activity   Alcohol use: No   Drug use: Not Currently    Types: Benzodiazepines   Sexual activity: Not Currently  Lifestyle   Physical activity    Days per week: Not on file    Minutes per session: Not on file   Stress: Not on file  Relationships   Social connections    Talks on phone: Not on file    Gets together: Not on file    Attends religious service: Not on file    Active member of club or organization: Not on file    Attends meetings of clubs or organizations: Not on file    Relationship status: Not on file   Intimate partner violence    Fear of current or ex partner: Not on file    Emotionally abused: Not on file    Physically abused: Not on file    Forced sexual activity: Not on file  Other Topics Concern   Not on file  Social History Narrative   Not on file        Family History  Problem Relation Age of Onset   Lung cancer Father    Heart disease Father    MI at age 49   Alcohol abuse Father    Alzheimer's disease Mother    Dementia Mother    Other Mother    twisted colon    Stomach cancer Neg Hx    Colon cancer Neg Hx    Review of Systems: As stated in the HPI and otherwise negative.   BP 120/60   Pulse (!) 51   Ht _0  (1.626 m)   Wt 215 lb (97.5 kg)   SpO2 96%   BMI 36.90 kg/m   Physical Examination:  General: Well developed, well nourished, NAD  HEENT: OP clear, mucus membranes moist  SKIN: warm, dry. No rashes.  Neuro: No focal deficits  Musculoskeletal: Muscle strength 5/5 all ext  Psychiatric: Mood and affect normal  Neck: No JVD, no carotid bruits, no thyromegaly, no lymphadenopathy.  Lungs:Clear bilaterally, no wheezes, rhonci, crackles  Cardiovascular: Regular rate and rhythm. Loud, harsh systolic murmur noted.  Abdomen:Soft. Bowel sounds present. Non-tender.  Extremities: No lower extremity edema. Pulses are 2 + in the bilateral DP/PT.   Echo  08/27/18:  - Left ventricle: The cavity size was normal. There was moderate  concentric hypertrophy. Systolic function was normal. The  estimated ejection fraction was in the range of 60% to 65%. Wall  motion was normal; there were no regional wall motion  abnormalities. Doppler parameters are consistent with abnormal  left ventricular relaxation (grade 1 diastolic dysfunction).  Doppler parameters are consistent with elevated mean left atrial  filling pressure.  - Aortic valve: Valve mobility was restricted. There was moderate  to severe stenosis. Valve area (VTI): 0.88 cm^2. Valve area  (Vmax): 0.94 cm^2. Valve area (Vmean): 0.93 cm^2.  - Left atrium: The atrium was mildly dilated.  - Tricuspid valve: There was mild-moderate regurgitation.  Impressions:  - No change since June 2019.  -------------------------------------------------------------------  Study data: Comparison was made to the study of 01/27/2018. Study  status: Routine. Procedure: The patient reported no pain pre or  post test. Transthoracic echocardiography. Image quality was  adequate. Transthoracic echocardiography. M-mode,  complete 2D, spectral Doppler, and color Doppler. Birthdate:  Patient birthdate: 09/23/1950. Age: Patient is 68 yr old. Sex:  Gender: female. BMI: 35.8 kg/m^2. Blood pressure: 156/75  Patient status: Outpatient. Study date: Study date: 08/27/2018.  Study time: 11:42 AM. Location: [facility]  -------------------------------------------------------------------  -------------------------------------------------------------------  Left ventricle: The cavity size was normal. There was moderate  concentric hypertrophy. Systolic function was normal. The estimated  ejection fraction was in the range of 60% to 65%. Wall motion was  normal; there were no regional wall motion abnormalities. Doppler  parameters are consistent with abnormal left ventricular relaxation  (grade 1 diastolic dysfunction).  Doppler parameters are consistent  with elevated mean left atrial filling pressure.  -------------------------------------------------------------------  Aortic valve: Probably trileaflet; moderately thickened,  moderately calcified leaflets. Valve mobility was restricted.  Doppler: There was moderate to severe stenosis. There was no  significant regurgitation. VTI ratio of LVOT to aortic valve:  0.31. Valve area (VTI): 0.88 cm^2. Indexed valve area (VTI): 0.41  cm^2/m^2. Peak velocity ratio of LVOT to aortic valve: 0.33. Valve  area (Vmax): 0.94 cm^2. Indexed valve area (Vmax): 0.44 cm^2/m^2.  Mean velocity ratio of LVOT to aortic valve: 0.33. Valve area  (Vmean): 0.93 cm^2. Indexed valve area (Vmean): 0.44 cm^2/m^2.  Mean gradient (S): 37 mm Hg. Peak gradient (S): 66 mm Hg.  -------------------------------------------------------------------  Aorta: Aortic root: The aortic root was normal in size.  Ascending aorta: The ascending aorta was normal in size.  -------------------------------------------------------------------  Mitral valve: Mildly thickened leaflets . Doppler:  Transvalvular velocity was within the normal range. There was no  evidence for stenosis. There was no significant regurgitation.  Peak gradient (D): 3 mm Hg.  -------------------------------------------------------------------  Left atrium: The atrium was mildly dilated.  -------------------------------------------------------------------  Right ventricle: The cavity size was normal. Systolic function was  normal.  -------------------------------------------------------------------  Pulmonic valve: Poorly visualized. The valve appears to be  grossly normal. Doppler: There was mild regurgitation.  -------------------------------------------------------------------  Tricuspid valve: Structurally normal valve. Leaflet separation  was normal. Doppler: Transvalvular velocity was within the normal  range. There was  mild-moderate regurgitation.  -------------------------------------------------------------------  Pulmonary artery: Systolic pressure was within the normal range.  -------------------------------------------------------------------  Right atrium: The atrium was normal in size.  -------------------------------------------------------------------  Pericardium: There was no pericardial effusion.  -------------------------------------------------------------------  Measurements  Left ventricle Value Reference  LV ID, ED, PLAX chordal 44.6 mm 43 - 52  LV ID, ES, PLAX chordal 27.8 mm 23 - 38  LV fx shortening, PLAX chordal 38 % >=29  LV PW thickness, ED 15.7 mm ----------  IVS/LV PW ratio, ED 0.84 <=1.3  Stroke volume, 2D 97 ml ----------  Stroke volume/bsa, 2D 45 ml/m^2 ----------  LV e&', lateral 5.22 cm/s ----------  LV E/e&', lateral 16.53 ----------  LV e&', medial 3.37 cm/s ----------  LV E/e&', medial 25.61 ----------  LV e&', average 4.3 cm/s ----------  LV E/e&', average 20.09 ----------  Longitudinal strain, TDI 19 % ----------  Ventricular septum Value Reference  IVS thickness, ED 13.26 mm ----------  LVOT Value Reference  LVOT ID, S 19 mm ----------  LVOT area 2.84 cm^2 ----------  LVOT peak velocity, S 122.26 cm/s ----------  LVOT mean velocity, S 94.7 cm/s ----------  LVOT VTI, S 29.84 cm ----------  LVOT peak gradient, S 6 mm Hg ----------  Stroke volume (SV), LVOT DP 84.6 ml ----------  Stroke index (SV/bsa), LVOT DP 39.6 ml/m^2 ----------  Aortic valve Value Reference  Aortic valve peak velocity, S 405 cm/s ----------  Aortic valve mean velocity, S 288 cm/s ----------  Aortic valve VTI, S 101 cm ----------  Aortic mean gradient, S 37 mm Hg ----------  Aortic peak gradient, S 66 mm Hg ----------  VTI ratio, LVOT/AV 0.31 ----------  Aortic valve area, VTI 0.88 cm^2 ----------  Aortic valve area/bsa, VTI 0.41 cm^2/m^2 ----------  Velocity ratio, peak, LVOT/AV 0.33  ----------  Aortic valve area, peak velocity 0.94 cm^2 ----------  Aortic valve area/bsa, peak 0.44 cm^2/m^2 ----------  velocity  Velocity ratio, mean, LVOT/AV 0.33 ----------  Aortic valve area, mean velocity 0.93 cm^2 ----------  Aortic valve area/bsa, mean 0.44 cm^2/m^2 ----------  velocity  Aorta Value Reference  Aortic root ID, ED 29 mm ----------  Left atrium Value Reference  LA ID, A-P, ES 39 mm ----------  LA ID/bsa, A-P 1.83 cm/m^2 <=2.2  LA volume, S 65.1 ml ----------  LA volume/bsa, S 30.5 ml/m^2 ----------  LA volume, ES, 1-p A4C 58.8 ml ----------  LA volume/bsa, ES, 1-p A4C 27.6 ml/m^2 ----------  LA volume, ES, 1-p A2C 70 ml ----------  LA volume/bsa, ES, 1-p A2C 32.8 ml/m^2 ----------  Mitral valve Value Reference  Mitral E-wave peak velocity 86.3 cm/s ----------  Mitral A-wave peak velocity 96 cm/s ----------  Mitral deceleration time (H) 511 ms 150 - 230  Mitral peak gradient, D 3 mm Hg ----------  Mitral E/A ratio, peak 0.9 ----------  Pulmonary arteries Value Reference  PA pressure, S, DP 28 mm Hg <=30  Tricuspid valve Value Reference  Tricuspid regurg peak velocity 251 cm/s ----------  Tricuspid peak RV-RA gradient 25 mm Hg ----------  Tricuspid maximal regurg 251 cm/s ----------  velocity, PISA  Right atrium Value Reference  RA ID, S-I, ES, A4C 48.2 mm 34 - 49  RA area, ES, A4C 13.1 cm^2 8.3 - 19.5  RA volume, ES, A/L 29.1 ml ----------  RA volume/bsa, ES, A/L 13.6 ml/m^2 ----------  Systemic veins Value Reference  Estimated CVP 3 mm Hg ----------  Right ventricle Value Reference  TAPSE 28.7 mm ----------  RV pressure, S, DP 28 mm Hg <=30  RV s&', lateral, S 11.6 cm/s ----------   EKG: EKG is ordered today.  The ekg ordered today demonstrates sinus brady, rate 51 bpm. T wave inversions diffusely. Unchanged.   Recent Labs:  02/03/2019: ALT 10; BUN 12; Creatinine, Ser 0.79; Hemoglobin 11.9; Platelets 116.0; Potassium 3.9; Sodium 140  Wt  Readings from Last 3 Encounters:  02/04/19 215 lb (97.5 kg)  01/11/19 216 lb (98 kg)  10/28/18  214 lb (97.1 kg)   STS Score Procedure: AVR + CAB  Risk of Mortality:  6.442%   Renal Failure:  7.740%   Permanent Stroke:  2.254%   Prolonged Ventilation:  29.788%   DSW Infection:  0.418%   Reoperation:  7.464%   Morbidity or Mortality:  35.432%   Short Length of Stay:  12.752%   Long Length of Stay:  23.355%   Assessment and Plan:   1. Severe Aortic stenosis: Here today for TAVR.   [name] 04/20/2019 7:37 AM

## 2019-04-20 NOTE — Anesthesia Procedure Notes (Signed)
Arterial Line Insertion Start/End9/15/2020 7:00 AM, 04/20/2019 7:10 AM Performed by: Moshe Salisbury, CRNA, CRNA  Patient location: Pre-op. Preanesthetic checklist: patient identified, IV checked, site marked, risks and benefits discussed, surgical consent, monitors and equipment checked, pre-op evaluation, timeout performed and anesthesia consent Lidocaine 1% used for infiltration and patient sedated Right, radial was placed Catheter size: 20 G Hand hygiene performed , maximum sterile barriers used  and Seldinger technique used Allen's test indicative of satisfactory collateral circulation Attempts: 1 Procedure performed without using ultrasound guided technique. Following insertion, dressing applied and Biopatch. Post procedure assessment: normal  Patient tolerated the procedure well with no immediate complications.

## 2019-04-20 NOTE — Progress Notes (Signed)
  Echocardiogram 2D Echocardiogram has been performed.  Kelly [name] 04/20/2019, 9:22 AM

## 2019-04-20 NOTE — Progress Notes (Signed)
Post op ISTAT results called to [place], [place]. 0.9NA infusing at 10cc/hr to left groin fv sheath w/temp wire.

## 2019-04-20 NOTE — Progress Notes (Signed)
  [facility] VALVE TEAM  Patient doing well s/p TAVR. She is hemodynamically stable but BP is soft. Groin sites stable. ECG with new LBBB with QRS 160 ms. She had some transient CHB after valve deployment and temp wire left in. Will plan to maintain temp wire in for at least 24 hours and monitor in ICU. Plan for early ambulation after temp wire removed and bed rest completed w/ hopeful discharge over the next 48 hours.   Angelena Form PA-C  [facility]  Pager [phone]

## 2019-04-20 NOTE — Transfer of Care (Signed)
Immediate Anesthesia Transfer of Care Note  Patient: Kelly [name]  Procedure(s) Performed: TRANSCATHETER AORTIC VALVE REPLACEMENT, TRANSFEMORAL approach using 68m Evolut PRO+ medtronic valve (N/A Chest) TRANSESOPHAGEAL ECHOCARDIOGRAM (TEE) (N/A Chest)  Patient Location: PACU  Anesthesia Type:MAC  Level of Consciousness: drowsy  Airway & Oxygen Therapy: Patient Spontanous Breathing and Patient connected to nasal cannula oxygen  Post-op Assessment: Report given to RN, Post -op Vital signs reviewed and stable and Patient moving all extremities  Post vital signs: Reviewed and stable  Last Vitals:  Vitals Value Taken Time  BP 102/39 04/20/19 0959  Temp 36.2 C 04/20/19 0953  Pulse 63 04/20/19 1001  Resp 25 04/20/19 1001  SpO2 96 % 04/20/19 1001  Vitals shown include unvalidated device data.  Last Pain:  Vitals:   04/20/19 0953  TempSrc: Temporal  PainSc: Asleep         Complications: No apparent anesthesia complications

## 2019-04-20 NOTE — Discharge Instructions (Signed)
ACTIVITY AND EXERCISE °• Daily activity and exercise are an important part of your recovery. People recover at different rates depending on their general health and type of valve procedure. °• Most people recovering from TAVR feel better relatively quickly  °• No lifting, pushing, pulling more than 10 pounds (examples to avoid: groceries, vacuuming, gardening, golfing): °            - For one week with a procedure through the groin. °            - For six weeks for procedures through the chest wall or neck. °NOTE: You will typically see one of our providers 7-14 days after your procedure to discuss WHEN TO RESUME the above activities.  °  °  °DRIVING °• Do not drive until you are seen for follow up and cleared by a provider. Generally, we ask patient to not drive for 1 week after their procedure. °• If you have been told by your doctor in the past that you [place] not drive, you must talk with him/her before you begin driving again. °  °DRESSING °• Groin site: you [place] leave the clear dressing over the site for up to one week or until it falls off. °  °HYGIENE °• If you had a femoral (leg) procedure, you Syl[place] take a shower when you return home. After the shower, pat the site dry. Do NOT use powder, oils or lotions in your groin area until the site has completely healed. °• If you had a chest procedure, you [place] shower when you return home unless specifically instructed not to by your discharging practitioner. °            - DO NOT scrub incision; pat dry with a towel. °            - DO NOT apply any lotions, oils, powders to the incision. °            - No tub baths / swimming for at least 2 weeks. °• If you notice any fevers, chills, increased pain, swelling, bleeding or pus, please contact your doctor. °  °ADDITIONAL INFORMATION °• If you are going to have an upcoming dental procedure, please contact our office as you will require antibiotics ahead of time to prevent infection on your heart valve.  ° ° °If you have any  questions or concerns you can call the structural heart phone during normal business hours 8am-4pm. If you have an urgent need after hours or weekends please call [phone] to talk to the on call provider for general cardiology. If you have an emergency that requires immediate attention, please call 911.  ° ° °After TAVR Checklist ° °Check  Test Description  ° Follow up appointment in 1-2 weeks  You will see our structural heart physician assistant, Katie Bobby Ragan. Your incision sites will be checked and you will be cleared to drive and resume all normal activities if you are doing well.    ° 1 month echo and follow up  You will have an echo to check on your new heart valve and be seen back in the office by Katie Paublo Warshawsky. Many times the echo is not read by your appointment time, but Katie will call you later that day or the following day to report your results.  ° Follow up with your primary cardiologist You will need to be seen by your primary cardiologist in the following 3-6 months after your 1 month appointment in the valve   clinic. Often times your Plavix or Aspirin will be discontinued during this time, but this is decided on a case by case basis.   ° 1 year echo and follow up You will have another echo to check on your heart valve after 1 year and be seen back in the office by Katie Dartanyon Frankowski. This your last structural heart visit.  ° Bacterial endocarditis prophylaxis  You will have to take antibiotics for the rest of your life before all dental procedures (even teeth cleanings) to protect your heart valve. Antibiotics are also required before some surgeries. Please check with your cardiologist before scheduling any surgeries. Also, please make sure to tell us if you have a penicillin allergy as you will require an alternative antibiotic.   ° ° °

## 2019-04-20 NOTE — Anesthesia Procedure Notes (Signed)
Procedure Name: MAC Date/Time: 04/20/2019 8:00 AM Performed by: Moshe Salisbury, CRNA Pre-anesthesia Checklist: Patient identified, Emergency Drugs available, Suction available, Patient being monitored and Timeout performed Patient Re-evaluated:Patient Re-evaluated prior to induction Oxygen Delivery Method: Nasal cannula Placement Confirmation: positive ETCO2

## 2019-04-20 NOTE — Op Note (Signed)
[facility]   MULTIDISCIPLINARY HEART VALVE TEAM   TAVR OPERATIVE NOTE   Date of Procedure:  04/20/2019  Preoperative Diagnosis: Severe Aortic Stenosis   Postoperative Diagnosis: Same   Procedure:    Transcatheter Aortic Valve Replacement - Percutaneous Right Transfemoral Approach  Medtronic CoreValve Evolut Pro (size 23 mm, serial # [id number])   Co-Surgeons:  [name], MD and Valentina Gu. Roxy Manns, MD  Anesthesiologist:  Albertha Ghee, MD  Echocardiographer:  Jenkins Rouge, MD  Pre-operative Echo Findings:  Severe aortic stenosis  Normal left ventricular systolic function  Post-operative Echo Findings:  No paravalvular leak  Normal left ventricular systolic function   BRIEF CLINICAL NOTE AND INDICATIONS FOR SURGERY  Patient is a 68 year old moderately obese female with history of aortic stenosis, coronary artery disease, hypertension, hyperlipidemia, NASH cirrhosis with portal hypertension, esophageal varices, type 2 diabetes mellitus, tobacco abuse, anxiety, and dementia who has been referred for surgical consultation to discuss treatment options for management of severe symptomatic aortic stenosis and multivessel coronary artery disease.  Patient has known of presence of a heart murmur for several years and was initially evaluated by Dr. Angelena Form in 2018.  Echocardiogram performed at that time revealed normal left ventricular systolic function with moderate aortic stenosis.  She has been followed regularly ever since.  She was seen in follow-up recently and reported progressive symptoms of exertional shortness of breath and fatigue with occasional mild dizzy spells.  Follow-up echocardiogram revealed moderately severe aortic stenosis with peak velocity across the aortic valve measured 4.0 m/s corresponding to mean transvalvular gradient estimated 37 mmHg.  The DVI was reported 0.31 with aortic valve area calculated 0.9 cm.  Left ventricular systolic  function remain normal with ejection fraction estimated 60 to 65%.  The patient subsequently underwent diagnostic cardiac catheterization which confirmed the presence of severe aortic stenosis with mean transvalvular gradient measured 41 mmHg at catheterization.  The patient was found to have multivessel coronary artery disease.  CT angiography was performed and the patient has been referred for surgical consultation.  The patient subsequently underwent uncomplicated multivessel PCI by Dr. Angelena Form.  During the course of the patient's preoperative work up they have been evaluated comprehensively by a multidisciplinary team of specialists coordinated through the [facility] in the [place] [facility].  They have been demonstrated to suffer from symptomatic severe aortic stenosis as noted above. The patient has been counseled extensively as to the relative risks and benefits of all options for the treatment of severe aortic stenosis including long term medical therapy, conventional surgery for aortic valve replacement, and transcatheter aortic valve replacement.  All questions have been answered, and the patient provides full informed consent for the operation as described.   DETAILS OF THE OPERATIVE PROCEDURE  PREPARATION:    The patient is brought to the operating room on the above mentioned date and central monitoring was established by the anesthesia team including placement of a central venous line and radial arterial line. The patient is placed in the supine position on the operating table.  Intravenous antibiotics are administered. The patient is monitored closely throughout the procedure under conscious sedation.  Baseline transthoracic echocardiogram was performed. The patient's chest, abdomen, both groins, and both lower extremities are prepared and draped in a sterile manner. A time out procedure is performed.   PERIPHERAL ACCESS:    Using the  modified Seldinger technique, femoral arterial and venous access was obtained with placement of 6 Fr sheaths on the  left side.  A pigtail diagnostic catheter was passed through the left arterial sheath under fluoroscopic guidance into the aortic root.  The pigtail catheter is positioned in the non-coronary sinus of Valsalva.  A temporary transvenous pacemaker catheter was passed through the left femoral venous sheath under fluoroscopic guidance into the right ventricle.  The pacemaker was tested to ensure stable lead placement and pacemaker capture. Aortic root angiography was performed in order to determine the optimal angiographic angle for valve deployment.   TRANSFEMORAL ACCESS:   Percutaneous transfemoral access and sheath placement was performed by Dr. Angelena Form using ultrasound guidance.  The right common femoral artery was cannulated using a micropuncture needle and appropriate location was verified using hand injection angiogram.  A pair of Abbott Perclose percutaneous closure devices were placed and a 6 French sheath replaced into the femoral artery.  The patient was heparinized systemically and ACT verified > 250 seconds.    A 18 Fr transfemoral Gore DrySeal sheath was introduced into the right common femoral artery after progressively dilating over an Amplatz superstiff wire. An AL-1 catheter was used to direct a straight-tip exchange length wire across the native aortic valve into the left ventricle. This was exchanged out for a pigtail catheter and position was confirmed in the LV apex. The pigtail catheter was exchanged for Confida wire in the LV apex.     TRANSCATHETER HEART VALVE DEPLOYMENT:   A Medtronic CoreValve Evolut Pro transcatheter heart valve (size23 mm, serial #[id number]) was prepared and crimped per manufacturer's guidelines, and the proper loading of the valve is confirmed on the [facility] Pro delivery system using flouroscopy. The valve and delivery system were advanced over the  guidewire, through the iliac arteries and aorta, and advanced across the aortic arch using flouroscopy. The valve was carefully positioned across the aortic valve annulus. Once appropriate position of the valve has been confirmed by angiographic assessment, the valve is deployed gradually to 80%, at which time a second aortogram was performed to confirm the appropriate depth and position of deployment.  Valve function was evaluated using echocardiography.  Once final position was confirmed, deployment was completed, the valve released, and the delivery system carefully removed from the aortic root. Valve function is assessed using echocardiography. There is felt to be no paravalvular leak and no central aortic insufficiency.  The patient's hemodynamic recovery following valve deployment is rapid and uneventful, although the patient did develop transient complete heart block during deployment.   PROCEDURE COMPLETION:   The deployment system is and guidewire were removed.  The DrySeal sheath was removed and femoral artery closure performed by securing the Perclose sutures.  Protamine was administered once femoral arterial repair was complete. The temporary pacemaker was secured in place.  The pigtail catheters and femoral sheaths were removed with manual pressure used for hemostasis.   The patient tolerated the procedure well and is transported to the surgical intensive care in stable condition. There were no immediate intraoperative complications. All sponge instrument and needle counts are verified correct at completion of the operation.   No blood products were administered during the operation.  The patient received a total of 55 mL of intravenous contrast during the procedure.   Rexene Alberts, MD 04/20/2019 9:31 AM

## 2019-04-20 NOTE — CV Procedure (Signed)
[facility] AND VASCULAR CENTER  TAVR OPERATIVE NOTE   Date of Procedure:  04/20/2019  Preoperative Diagnosis: Severe Aortic Stenosis   Postoperative Diagnosis: Same   Procedure:    Transcatheter Aortic Valve Replacement - Transfemoral Approach  Medtronic Evolut Pro Plus 23 mm (model # [id number], serial # [id number])   Co-Surgeons:  [name], MD and Valentina Gu. Roxy Manns, MD   Anesthesiologist:  Hodierne  Echocardiographer:  Johnsie Cancel  Pre-operative Echo Findings:  Severe aortic stenosis  Normal left ventricular systolic function  Post-operative Echo Findings:  No paravalvular leak  Normal left ventricular systolic function  BRIEF CLINICAL NOTE AND INDICATIONS FOR SURGERY  68 yo female with history of CAD, DM, HTN, HLD, memory issues, tobacco abuse, aortic stenosis, esophageal varices and hepatic cirrhosis who is here today for TAVR. I saw her as a new patient in February 2019 for aortic valve stenosis. Echo December 2018 with normal LV systolic function. The aortic valve was thickened with restricted leaflet excursion, mean gradient 36 mmHg, peak gradient 59 mmHg, AVA 1.22 cm2, DVI 0.39. She had mild dyspnea with exertion with fatigue and mild dizziness at times. Her aortic stenosis was moderate by echo criteria. Echo 01/27/18 with normal LV systolic function, moderately severe AS with mean gradient of 37 mmHg, peak gradient 64 mmHg, AVA 1.17cm2, DVI 0.37. At her visit here in July 2019 she was feeling fatigued but she had also just been found to be anemic. Colonoscopy September 2019 with non bleeding AVMs and diverticular disease. Non-bleeding esophageal varices were seen on upper endoscopy. Echo 08/27/18 with LVEF=60-65%. There is moderately severe aortic stenosis with thickened and calcified valve leaflets. Mean gradient 37 mmHg, peak gradient 66 mmHg, Dimensionless index 0.31. AVA 0.88 cm2. I saw her in the office in February 2020 and she complained of more dyspnea with  exertion and fatigue with dizziness. I arranged a cardiac catheterization on 10/07/18 and she was found to have severe three vessel CAD. Her gradients across the aortic valve by cath were consistent with severe AS. I referred her to CT surgery to consider CABG with AVR. She was seen by Dr. Roxy Manns 10/19/18 and not felt to be a good candidate for surgical AVR and CABG due to the small size of her aortic root. She is back today to discuss PCI prior to proceeding with TAVR. CTA chest/ abdomen/pelvis 10/14/18 showed adequate access for TAVR. There is a left lower lobe pulmonary nodule and non-specific nodular appearing areas of ground glass attenuation in the upper lungs. (3 month f/u recommended). Cardiac CT showed adequate coronary heights with Annulus area of 317 mm2. Abdominal u/s ordered by GI (Dr. [name]) with findings consistent with hepatic cirrhosis. She is followed in the GI clinic with hepatic cirrhosis. She has undergone recent dental extraction pre TAVR. She has had recent worsening chronic anemia  TAVR delayed for iron infusion. Still with ongoing dyspnea.   During the course of the patient's preoperative work up they have been evaluated comprehensively by a multidisciplinary team of specialists coordinated through the [facility] in the [place] [facility].  They have been demonstrated to suffer from symptomatic severe aortic stenosis as noted above. The patient has been counseled extensively as to the relative risks and benefits of all options for the treatment of severe aortic stenosis including long term medical therapy, conventional surgery for aortic valve replacement, and transcatheter aortic valve replacement.  The patient has been independently evaluated by Dr. Roxy Manns with CT surgery and they are  felt to be at high risk for conventional surgical aortic valve replacement. The surgeon indicated the patient would be a poor candidate for conventional surgery. Based  upon review of all of the patient's preoperative diagnostic tests they are felt to be candidate for transcatheter aortic valve replacement using the transfemoral approach as an alternative to high risk conventional surgery.    Following the decision to proceed with transcatheter aortic valve replacement, a discussion has been held regarding what types of management strategies would be attempted intraoperatively in the event of life-threatening complications, including whether or not the patient would be considered a candidate for the use of cardiopulmonary bypass and/or conversion to open sternotomy for attempted surgical intervention.  The patient has been advised of a variety of complications that might develop peculiar to this approach including but not limited to risks of death, stroke, paravalvular leak, aortic dissection or other major vascular complications, aortic annulus rupture, device embolization, cardiac rupture or perforation, acute myocardial infarction, arrhythmia, heart block or bradycardia requiring permanent pacemaker placement, congestive heart failure, respiratory failure, renal failure, pneumonia, infection, other late complications related to structural valve deterioration or migration, or other complications that might ultimately cause a temporary or permanent loss of functional independence or other long term morbidity.  The patient provides full informed consent for the procedure as described and all questions were answered preoperatively.    DETAILS OF THE OPERATIVE PROCEDURE  PREPARATION:   The patient is brought to the operating room on the above mentioned date and central monitoring was established by the anesthesia team including placement of a radial arterial line. The patient is placed in the supine position on the operating table.  Intravenous antibiotics are administered. Conscious sedation is used.   Baseline transthoracic echocardiogram was performed. The patient's  chest, abdomen, both groins, and both lower extremities are prepared and draped in a sterile manner. A time out procedure is performed.   PERIPHERAL ACCESS:   Using the modified Seldinger technique, femoral arterial and venous access were obtained with placement of 6 Fr sheaths on the left side using u/s guidance.  A pigtail diagnostic catheter was passed through the femoral arterial sheath under fluoroscopic guidance into the aortic root.  A temporary transvenous pacemaker catheter was passed through the femoral venous sheath under fluoroscopic guidance into the right ventricle.  The pacemaker was tested to ensure stable lead placement and pacemaker capture. Aortic root angiography was performed in order to confirm placement of the pigtail catheter in the non-coronary cusp.   TRANSFEMORAL ACCESS:  A micropuncture kit was used to gain access to the right femoral artery using u/s guidance. Position confirmed with angiography. Pre-closure with double ProGlide closure devices. The patient was heparinized systemically and ACT verified > 250 seconds.    A 14 Fr transfemoral Dry Seal introduced into the right femoral artery after progressively dilating over an Amplatz superstiff wire. An AL-1 catheter was used to direct a straight-tip exchange length wire across the native aortic valve into the left ventricle. This was exchanged out for a pigtail catheter and position was confirmed in the LV apex. Simultaneous LV and Ao pressures were recorded.  The pigtail catheter was then exchanged for a Confida stiff wire in the LV apex.   TRANSCATHETER HEART VALVE DEPLOYMENT:  A Medtronic Evolut Pro Plus 23 mm valve was prepared and per manufacturer's guidelines, and the proper orientation of the valve is confirmed on the delivery system. The valve was advanced through the introducer sheath using normal technique and  then advanced across the aortic arch. The valve was carefully positioned across the aortic valve annulus.  Once final position of the valve has been confirmed by angiographic assessment, the valve is deployed with pacing at 120 beats per minute. There is felt to be no paravalvular leak and no central aortic insufficiency.  The patient's hemodynamic recovery following valve deployment is good.  The deployment catheter and guidewire are both removed. Echo demostrated acceptable post-procedural gradients, stable mitral valve function, and no AI.   PROCEDURE COMPLETION:  The sheath was then removed and closure devices were completed. Protamine was administered once femoral arterial repair was complete. The temporary pacemaker, pigtail catheters and femoral sheath were removed with a Mynx closure device used to close the left femoral artery.  Temporary pacemaker left in place.    The patient tolerated the procedure well and is transported to the cath lab recovery area in stable condition. There were no immediate intraoperative complications. All sponge instrument and needle counts are verified correct at completion of the operation.   No blood products were administered during the operation.  The patient received a total of 55 mL of intravenous contrast during the procedure.  [name] MD 04/20/2019 9:42 AM

## 2019-04-21 ENCOUNTER — Inpatient Hospital Stay (HOSPITAL_COMMUNITY): Payer: Medicare HMO

## 2019-04-21 ENCOUNTER — Encounter (HOSPITAL_COMMUNITY): Payer: Self-pay | Admitting: Cardiovascular Disease

## 2019-04-21 ENCOUNTER — Other Ambulatory Visit: Payer: Self-pay | Admitting: *Deleted

## 2019-04-21 ENCOUNTER — Inpatient Hospital Stay (INDEPENDENT_AMBULATORY_CARE_PROVIDER_SITE_OTHER): Payer: Medicare HMO

## 2019-04-21 DIAGNOSIS — Z952 Presence of prosthetic heart valve: Secondary | ICD-10-CM

## 2019-04-21 DIAGNOSIS — I442 Atrioventricular block, complete: Secondary | ICD-10-CM

## 2019-04-21 DIAGNOSIS — I447 Left bundle-branch block, unspecified: Secondary | ICD-10-CM

## 2019-04-21 DIAGNOSIS — I5033 Acute on chronic diastolic (congestive) heart failure: Secondary | ICD-10-CM

## 2019-04-21 DIAGNOSIS — Z23 Encounter for immunization: Secondary | ICD-10-CM | POA: Diagnosis not present

## 2019-04-21 DIAGNOSIS — I350 Nonrheumatic aortic (valve) stenosis: Secondary | ICD-10-CM

## 2019-04-21 DIAGNOSIS — E119 Type 2 diabetes mellitus without complications: Secondary | ICD-10-CM | POA: Diagnosis not present

## 2019-04-21 LAB — BASIC METABOLIC PANEL
Anion gap: 6 (ref 5–15)
BUN: 9 mg/dL (ref 8–23)
CO2: 23 mmol/L (ref 22–32)
Calcium: 8.5 mg/dL — ABNORMAL LOW (ref 8.9–10.3)
Chloride: 110 mmol/L (ref 98–111)
Creatinine, Ser: 0.76 mg/dL (ref 0.44–1.00)
GFR calc Af Amer: >60 mL/min (ref >60)
GFR calc non Af Amer: >60 mL/min (ref >60)
Glucose, Bld: 162 mg/dL — ABNORMAL HIGH (ref 70–99)
Potassium: 4.2 mmol/L (ref 3.5–5.1)
Sodium: 139 mmol/L (ref 135–145)

## 2019-04-21 LAB — CBC
HCT: 26 % — ABNORMAL LOW (ref 36.0–46.0)
Hemoglobin: 8 g/dL — ABNORMAL LOW (ref 12.0–15.0)
MCH: 28.5 pg (ref 26.0–34.0)
MCHC: 30.8 g/dL (ref 30.0–36.0)
MCV: 92.5 fL (ref 80.0–100.0)
Platelets: 90 10*3/uL — ABNORMAL LOW (ref 150–400)
RBC: 2.81 MIL/uL — ABNORMAL LOW (ref 3.87–5.11)
RDW: 14.8 % (ref 11.5–15.5)
WBC: 9 10*3/uL (ref 4.0–10.5)
nRBC: 0 % (ref 0.0–0.2)

## 2019-04-21 LAB — ECHOCARDIOGRAM LIMITED
Height: 64 in
Weight: 3534.41 oz

## 2019-04-21 LAB — GLUCOSE, CAPILLARY
Glucose-Capillary: 178 mg/dL — ABNORMAL HIGH (ref 70–99)
Glucose-Capillary: 226 mg/dL — ABNORMAL HIGH (ref 70–99)
Glucose-Capillary: 196 mg/dL — ABNORMAL HIGH (ref 70–99)

## 2019-04-21 LAB — MAGNESIUM: Magnesium: 1.7 mg/dL (ref 1.7–2.4)

## 2019-04-21 LAB — MRSA PCR SCREENING: MRSA by PCR: NEGATIVE

## 2019-04-21 MED ORDER — FUROSEMIDE 20 MG PO TABS
20.0000 mg | ORAL_TABLET | Freq: Every day | ORAL | Status: DC
  Administered 2019-04-21: 20 mg via ORAL
  Filled 2019-04-21: qty 1

## 2019-04-21 MED ORDER — LOSARTAN POTASSIUM 50 MG PO TABS
100.0000 mg | ORAL_TABLET | Freq: Every day | ORAL | Status: DC
  Administered 2019-04-21: 10:00:00 100 mg via ORAL
  Filled 2019-04-21: qty 2

## 2019-04-21 MED ORDER — INFLUENZA VAC A&B SA ADJ QUAD 0.5 ML IM PRSY
0.5000 mL | PREFILLED_SYRINGE | Freq: Once | INTRAMUSCULAR | Status: AC
  Administered 2019-04-21: 17:00:00 0.5 mL via INTRAMUSCULAR
  Filled 2019-04-21: qty 0.5

## 2019-04-21 MED ORDER — AMLODIPINE BESYLATE 10 MG PO TABS
10.0000 mg | ORAL_TABLET | Freq: Every day | ORAL | Status: DC
  Administered 2019-04-21: 10 mg via ORAL
  Filled 2019-04-21: qty 1

## 2019-04-21 MED ORDER — POTASSIUM CHLORIDE ER 10 MEQ PO TBCR
20.0000 meq | EXTENDED_RELEASE_TABLET | Freq: Every day | ORAL | Status: DC
  Administered 2019-04-21: 20 meq via ORAL
  Filled 2019-04-21: qty 2

## 2019-04-21 NOTE — Plan of Care (Signed)

## 2019-04-21 NOTE — Progress Notes (Addendum)
[place] VALVE TEAM  Patient Name: Kelly [name] Date of Encounter: 04/21/2019  Primary Cardiologist: Dr. Angelena Form / Dr. Angelena Form & Dr. Roxy Manns (TAVR)  Hospital Problem List     Principal Problem:   S/P TAVR (transcatheter aortic valve replacement) Active Problems:   Hypertension   Thrombocytopenia (HCC)   DM (diabetes mellitus), type 2 (HCC)   Dementia (HCC)   Iron deficiency anemia   Portal hypertensive gastropathy (HCC)   Esophageal varices determined by endoscopy ([place])   Severe aortic stenosis   Coronary artery disease involving native coronary artery without angina pectoris   Incidental pulmonary nodule, greater than or equal to 68m   Obesity   NASH (nonalcoholic steatohepatitis)   Tobacco abuse   Acute on chronic diastolic heart failure (HCC)   LBBB (left bundle branch block)     Subjective   No complaints. Still on bed rest.   Inpatient Medications    Scheduled Meds: . amLODipine  10 mg Oral Daily  . aspirin EC  81 mg Oral Daily  . atorvastatin  20 mg Oral Daily  . Chlorhexidine Gluconate Cloth  6 each Topical Daily  . clopidogrel  75 mg Oral Q breakfast  . ferrous sulfate  325 mg Oral Daily  . furosemide  20 mg Oral Daily  . insulin aspart  0-24 Units Subcutaneous TID AC & HS  . levothyroxine  75 mcg Oral Daily  . losartan  100 mg Oral Daily  . pneumococcal 23 valent vaccine  0.5 mL Intramuscular Tomorrow-1000  . potassium chloride  20 mEq Oral Daily  . sodium chloride flush  3 mL Intravenous Q12H   Continuous Infusions: . sodium chloride 250 mL (04/21/19 0813)  . levofloxacin (LEVAQUIN) IV    . nitroGLYCERIN 100 mcg/min (04/21/19 0700)  . phenylephrine (NEO-SYNEPHRINE) Adult infusion     PRN Meds: sodium chloride, acetaminophen **OR** acetaminophen, morphine injection, ondansetron (ZOFRAN) IV, oxyCODONE, sodium chloride flush, traMADol, traZODone, zolpidem   Vital Signs    Vitals:   04/21/19 0715  04/21/19 0720 04/21/19 0730 04/21/19 0748  BP:      Pulse: 72 70 74   Resp: (!) 29 20 (!) 31   Temp:    99.2 F (37.3 C)  TempSrc:    Oral  SpO2: 94% 93% 95%   Weight:      Height:        Intake/Output Summary (Last 24 hours) at 04/21/2019 0856 Last data filed at 04/21/2019 0700 Gross per 24 hour  Intake 2014.45 ml  Output 655 ml  Net 1359.45 ml   Filed Weights   04/21/19 0500  Weight: 100.2 kg    Physical Exam   GEN: Well nourished, well developed, in no acute distress. obese HEENT: Grossly normal.  Neck: Supple, no JVD, carotid bruits, or masses. Cardiac: RRR, 3/6 SEM at RUSB. No rubs, or gallops. No clubbing, cyanosis, edema.   Respiratory:  Respirations regular and unlabored, clear to auscultation bilaterally. GI: Soft, nontender, nondistended, BS + x 4. MS: no deformity or atrophy. Skin: warm and dry, no rash.  Groin sites clear without hematoma or ecchymosis. Temp wire still in left groin. Neuro:  Strength and sensation are intact. Psych: AAOx3.  Normal affect.  Labs    CBC Recent Labs    04/20/19 1014 04/21/19 0434  WBC  --  9.0  HGB 8.2* 8.0*  HCT 24.0* 26.0*  MCV  --  92.5  PLT  --  90*  Basic Metabolic Panel Recent Labs    04/20/19 1014 04/21/19 0434  NA 139 139  K 4.4 4.2  CL 105 110  CO2  --  23  GLUCOSE 297* 162*  BUN 16 9  CREATININE 0.80 0.76  CALCIUM  --  8.5*  MG  --  1.7   Liver Function Tests No results for input(s): AST, ALT, ALKPHOS, BILITOT, PROT, ALBUMIN in the last 72 hours. No results for input(s): LIPASE, AMYLASE in the last 72 hours. Cardiac Enzymes No results for input(s): CKTOTAL, CKMB, CKMBINDEX, TROPONINI in the last 72 hours. BNP Invalid input(s): POCBNP D-Dimer No results for input(s): DDIMER in the last 72 hours. Hemoglobin A1C No results for input(s): HGBA1C in the last 72 hours. Fasting Lipid Panel No results for input(s): CHOL, HDL, LDLCALC, TRIG, CHOLHDL, LDLDIRECT in the last 72 hours. Thyroid Function  Tests No results for input(s): TSH, T4TOTAL, T3FREE, THYROIDAB in the last 72 hours.  Invalid input(s): FREET3  Telemetry    Sinus with LBBB, few pauses  - Personally Reviewed  ECG    NSR with LBBB with regression of QRS to 144 ms, HR 74 - Personally Reviewed  Radiology    Dg Chest Port 1 View  Result Date: 04/20/2019 CLINICAL DATA:  Status post transcatheter aortic valve repair. EXAM: PORTABLE CHEST 1 VIEW COMPARISON:  Radiographs of April 16, 2019. FINDINGS: Mild cardiomegaly is noted. Aortic valve prosthesis is noted. No pneumothorax or pleural effusion is noted. Both lungs are clear. The visualized skeletal structures are unremarkable. IMPRESSION: No acute cardiopulmonary abnormality seen. Electronically Signed   By: Marijo Conception M.D.   On: 04/20/2019 12:37    Cardiac Studies   TAVR OPERATIVE NOTE   Date of Procedure:                04/20/2019  Preoperative Diagnosis:      Severe Aortic Stenosis   Postoperative Diagnosis:    Same   Procedure:        Transcatheter Aortic Valve Replacement - Percutaneous Right Transfemoral Approach             Medtronic CoreValve Evolut Pro (size 23 mm, serial # [id number])              Co-Surgeons:                        [name], MD and Valentina Gu. Roxy Manns, MD  Anesthesiologist:                  Albertha Ghee, MD  Echocardiographer:              Jenkins Rouge, MD  Pre-operative Echo Findings: ? Severe aortic stenosis ? Normal left ventricular systolic function  Post-operative Echo Findings: ? No paravalvular leak ? Normal left ventricular systolic function  __________________   Echo 04/21/19: pending    Patient Profile     Kelly [name] is a 68 y.o. female with a history of moderate obesity, CAD s/p recent PCI x2, HTN, HLD,NASHcirrhosis with portal hypertension (confirmed by recent abdominal US), esophageal varices, DMT2, tobacco abuse, anxiety, dementia, and severe AS who presented to [facility] on  04/20/19 for planned TAVR.   Assessment & Plan    Severe AS: s/p successful TAVR with a 23 mm Medtronic Evolut Pro THV via the TF approach on 04/20/19. Post operative echo pending. Groin sites are stable. ECG with new LBBB but no high grade heart block. Continue Asprin and  plavix. Plan for echo. She will need to ambulate after bed rest complete. Possible home later this afternoon with a Zio patch.  New LBBB: she some brief CHB after valve deployment and new LBBB. Temp wire was placed overnight with only a few pauses. QRS width has regressed some and now 144 ms. Will remove temp wire now and plan to place a zio patch prior to discharge.   CAD: s/p PCI/DES to [facility] and PCI/DES to mLAD on 7/9. Contnue Aspirin and plavix, which she has not been compliant with at home. I discussed the importance of DAPT with her. Continue statin   HTN: she has been hypertensive and on a nitro gtt. Will resume home meds and wean off drip.   Acute on chronic disatolic CHF: as evidenced by an elevated BNP on pre admission lab work. This has been treated with TAVR. Will resume home lasix/kdur  DMT2: Hg A1c 6.1, being treated with SSI while admitted.   Pulmonary nodule: seen on pre TAVR CT. Will require follow up. I will discuss this in the outpatient setting.   Signed, Angelena Form, PA-C  04/21/2019, 8:56 AM  Pager [phone]  I have personally seen and examined this patient. I agree with the assessment and plan as outlined above.   She is doing well this am. No heart block overnight. Will remove pacemaker wire this am. Ambulate. Discharge home today if echo is ok.   [name] 04/21/2019 9:51 AM

## 2019-04-21 NOTE — Progress Notes (Signed)
  Echocardiogram 2D Echocardiogram has been performed.  Kelly [name] 04/21/2019, 11:36 AM

## 2019-04-21 NOTE — Anesthesia Postprocedure Evaluation (Signed)
Anesthesia Post Note  Patient: Kelly [name]  Procedure(s) Performed: TRANSCATHETER AORTIC VALVE REPLACEMENT, TRANSFEMORAL approach using 77m Evolut PRO+ medtronic valve (N/A Chest) TRANSESOPHAGEAL ECHOCARDIOGRAM (TEE) (N/A Chest)     Patient location during evaluation: PACU Anesthesia Type: MAC Level of consciousness: awake and alert Pain management: pain level controlled Vital Signs Assessment: post-procedure vital signs reviewed and stable Respiratory status: spontaneous breathing, nonlabored ventilation, respiratory function stable and patient connected to nasal cannula oxygen Cardiovascular status: stable and blood pressure returned to baseline Postop Assessment: no apparent nausea or vomiting Anesthetic complications: no    Last Vitals:  Vitals:   04/21/19 1700 04/21/19 1722  BP: (!) 127/56 (!) 122/47  Pulse: 67 66  Resp: (!) 29 (!) 26  Temp:    SpO2: (!) 89% 92%    Last Pain:  Vitals:   04/21/19 1622  TempSrc:   PainSc: Asleep                 Basel Defalco S

## 2019-04-21 NOTE — Progress Notes (Signed)
CARDIAC REHAB PHASE I   PRE:  Rate/Rhythm: 64 SR    BP: sitting 120/53    SaO2: 96 2L, 95 RA  MODE:  Ambulation: 160 ft   POST:  Rate/Rhythm: 85 SR    BP: sitting 127/47     SaO2: 95 RA  Pt recently off bedrest, still in bed. Needed mod assist to get to EOB with groin incisions and position. Stood independently and walked with RW, assist x2 for safety. No major c/o, just a little weak due to bedrest. To recliner for lunch. Discussed restrictions and walking at home. Already referred to [facility]. Pt sts no diet questions.  Kelly [name], ACSM 04/21/2019 1:42 PM

## 2019-04-21 NOTE — CV Procedure (Signed)
2D echo attempted but RN holding pressure, will try later.

## 2019-04-21 NOTE — Discharge Summary (Signed)
[place] VALVE TEAM  Discharge Summary    Patient ID: Kelly [name] MRN: [id number]; DOB: [birth year]  Admit date: 04/20/2019 Discharge date: 04/21/2019  Primary Care Provider: [name], MD  Primary Cardiologist: [name], MD / Dr. Angelena Form & Dr. Roxy Manns (TAVR)  Discharge Diagnoses    Principal Problem:   S/P TAVR (transcatheter aortic valve replacement) Active Problems:   Hypertension   Thrombocytopenia (HCC)   DM (diabetes mellitus), type 2 (HCC)   Dementia ([place])   Iron deficiency anemia   Portal hypertensive gastropathy (HCC)   Esophageal varices determined by endoscopy ([place])   Severe aortic stenosis   Coronary artery disease involving native coronary artery without angina pectoris   Incidental pulmonary nodule, greater than or equal to 43m   Obesity   NASH (nonalcoholic steatohepatitis)   Tobacco abuse   Acute on chronic diastolic heart failure (HCC)   LBBB (left bundle branch block)   Allergies Allergies  Allergen Reactions   Prednisone Itching    Heart rate increased   Celecoxib Rash   Macrodantin [Nitrofurantoin Macrocrystal] Rash   Penicillins Hives    Did it involve swelling of the face/tongue/throat, SOB, or low BP? No Did it involve sudden or severe rash/hives, skin peeling, or any reaction on the inside of your mouth or nose? No Did you need to seek medical attention at a hospital or doctor's office? No When did it last happen?20 + years If all above answers are "NO", [place] proceed with cephalosporin use.     Diagnostic Studies/Procedures    TAVR OPERATIVE NOTE   Date of Procedure:04/20/2019  Preoperative Diagnosis:Severe Aortic Stenosis   Postoperative Diagnosis:Same   Procedure:   Transcatheter Aortic Valve Replacement - PercutaneousRightTransfemoral Approach Medtronic CoreValve Evolut Pro (size 22m serial #  [id number] Co-Surgeons:Christopher McAngelena FormMD andClarence H. OwRoxy MannsMD  Anesthesiologist:Adam HoMarcie BalMD  Echocardiographer:Peter NiJohnsie CancelMD  Pre-operative Echo Findings: ? Severe aortic stenosis ? Normalleft ventricular systolic function  Post-operative Echo Findings: ? Noparavalvular leak ? Normalleft ventricular systolic function  __________________   Echo 04/21/19: completed but pending formal read at the time of discharge     History of Present Illness       CaLevette [name] is a [age].o. female with a history of moderate obesity, CAD s/p recent PCI x2, HTN, HLD,NASHcirrhosis with portal hypertension (confirmed by recent abdominal [place] esophageal varices, DMT2, tobacco abuse, anemia, anxiety, dementia, and severe AS who [facility] planned [facility].   Patient has known of presence of a heart murmur for several years and was initially evaluated by Dr. McAngelena Formn 2018.  Echocardiogram performed at that time revealed normal left ventricular systolic function with moderate aortic stenosis.  She has been followed regularly ever since.  She was seen in follow-up recently and reported progressive symptoms of exertional shortness of breath and fatigue with occasional mild dizzy spells.  Follow-up echocardiogram revealed moderately severe aortic stenosis with peak velocity across the aortic valve measured 4.0 m/s corresponding to mean transvalvular gradient estimated 37 mmHg.  The DVI was reported 0.31 with aortic valve area calculated 0.9 cm.  Left ventricular systolic function remain normal with ejection fraction estimated 60 to 65%.  The patient subsequently underwent diagnostic cardiac catheterization which confirmed the presence of severe aortic stenosis with mean transvalvular gradient measured 41 mmHg at catheterization. The patient was found to have triple vessel CAD 70% pRCA, 95% RPDA, 40%  mRCA, 50% post Atrio, 95%  pLAD, 80% D1, 99% 2nd Mrg, 50% mid-dist LCx. Due to the covid pandemic her work up was delayed. She later underwent succesful PCI/DES to [facility] and PCI/DES to mLAD on 02/11/19 and was set up for TAVR at the end of July.  However, this was ultimately canceled after preadmission lab work showed a drop in her hemoglobin to 7.6.  She was referred to heme-onc and started on iron infusions.  Her hemoglobin eventually increased to 9.4 and she was set up for TAVR on 04/20/2019.   Hospital Course     Consultants: none  Severe AS:s/p successful TAVR with a 23 mm Medtronic Evolut Pro THV via the TF approach on 04/20/19. Post operative echo completed but pending formal read. Groin sites are stable. ECG with new LBBB but no high grade heart block. Continued on home Asprin and plavix given recent stents. Plan for DC home today with Zio patch with close follow up next week.  New LBBB: had brief CHB after valve deployment and new LBBB on ECG. Temp wire was continued overnight. Telemetry shows sinus with only a few pauses. QRS width has regressed from 160 to 144 ms. She has propranolol on her med list but reported that she has not been taking it. Will discontinue this. Zio patch has been placed at discharge.   CAD: s/p PCI/DES to [facility] and PCI/DES to mLAD on 02/11/19. Contnue Aspirin and plavix, which she has not been compliant with at home. I discussed the importance of DAPT with her. Continue statin   HTN:she was hypertensive requiring a nitro gtt. She has been resumed on her home anti hypertensive mediations.   Acute on chronic disatolic CHF: as evidenced by an elevated BNP on pre admission lab work. This has been treated with TAVR. Will resume home lasix/kdur  DMT2: Hg A1c 6.1. This was treated with SSI while admitted.   Pulmonary nodule: seen on pre TAVR CT. Will require follow up. I will discuss this in the outpatient setting.   Anemia: Her hemoglobin has improved status post iron  infusions.  Hemoglobin 8.0 today.  Continue regular follow-up with Dr. Osker Mason. _____________  Discharge Vitals Blood pressure (!) 110/54, pulse 71, temperature 98.6 F (37 C), temperature source Oral, resp. rate 17, height 5' 4"  (1.626 m), weight 100.2 kg, SpO2 95 %.  Filed Weights   04/21/19 0500  Weight: 100.2 kg    Labs & Radiologic Studies    CBC Recent Labs    04/20/19 1014 04/21/19 0434  WBC  --  9.0  HGB 8.2* 8.0*  HCT 24.0* 26.0*  MCV  --  92.5  PLT  --  90*   Basic Metabolic Panel Recent Labs    04/20/19 1014 04/21/19 0434  NA 139 139  K 4.4 4.2  CL 105 110  CO2  --  23  GLUCOSE 297* 162*  BUN 16 9  CREATININE 0.80 0.76  CALCIUM  --  8.5*  MG  --  1.7   Liver Function Tests No results for input(s): AST, ALT, ALKPHOS, BILITOT, PROT, ALBUMIN in the last 72 hours. No results for input(s): LIPASE, AMYLASE in the last 72 hours. Cardiac Enzymes No results for input(s): CKTOTAL, CKMB, CKMBINDEX, TROPONINI in the last 72 hours. BNP Invalid input(s): POCBNP D-Dimer No results for input(s): DDIMER in the last 72 hours. Hemoglobin A1C No results for input(s): HGBA1C in the last 72 hours. Fasting Lipid Panel No results for input(s): CHOL, HDL, LDLCALC, TRIG, CHOLHDL, LDLDIRECT in the last 72 hours. Thyroid  Function Tests No results for input(s): TSH, T4TOTAL, T3FREE, THYROIDAB in the last 72 hours.  Invalid input(s): FREET3 _____________  Dg Chest 2 View  Result Date: 04/16/2019 CLINICAL DATA:  Preop evaluation for upcoming aortic valve surgery EXAM: CHEST - 2 VIEW COMPARISON:  01/27/2019 FINDINGS: The heart size and mediastinal contours are within normal limits. Both lungs are clear. The visualized skeletal structures are unremarkable. IMPRESSION: No active cardiopulmonary disease. Electronically Signed   By: Inez Catalina M.D.   On: 04/16/2019 17:23   Dg Chest Port 1 View  Result Date: 04/20/2019 CLINICAL DATA:  Status post transcatheter aortic valve  repair. EXAM: PORTABLE CHEST 1 VIEW COMPARISON:  Radiographs of April 16, 2019. FINDINGS: Mild cardiomegaly is noted. Aortic valve prosthesis is noted. No pneumothorax or pleural effusion is noted. Both lungs are clear. The visualized skeletal structures are unremarkable. IMPRESSION: No acute cardiopulmonary abnormality seen. Electronically Signed   By: Marijo Conception M.D.   On: 04/20/2019 12:37   Disposition   Pt is being discharged home today in good condition.  Follow-up Plans & Appointments    Follow-up Information    Eileen Stanford, PA-C. Go on 04/28/2019.   Specialties: Cardiology, Radiology Why: @ 2pm, please arrive at least 10 minutes early Contact information: [place] [place] [address] [phone]            Discharge Medications   Allergies as of 04/21/2019      Reactions   Prednisone Itching   Heart rate increased   Celecoxib Rash   Macrodantin [nitrofurantoin Macrocrystal] Rash   Penicillins Hives   Did it involve swelling of the face/tongue/throat, SOB, or low BP? No Did it involve sudden or severe rash/hives, skin peeling, or any reaction on the inside of your mouth or nose? No Did you need to seek medical attention at a hospital or doctor's office? No When did it last happen?20 + years If all above answers are "NO", [place] proceed with cephalosporin use.      Medication List    STOP taking these medications   propranolol 40 MG tablet Commonly known as: INDERAL     TAKE these medications   Accu-Chek Aviva Plus test strip Generic drug: glucose blood   Accu-Chek Aviva Plus w/Device Kit   Accu-Chek Softclix Lancets lancets   amLODipine 10 MG tablet Commonly known as: NORVASC Take 1 tablet (10 mg total) by mouth daily.   aspirin EC 81 MG tablet Take 1 tablet (81 mg total) by mouth daily.   atorvastatin 20 MG tablet Commonly known as: LIPITOR TAKE 1 TABLET EVERY DAY What changed: how to take this    Citrucel oral powder Generic drug: methylcellulose Take 1 packet by mouth daily as needed (constipation).   clopidogrel 75 MG tablet Commonly known as: PLAVIX Take 1 tablet (75 mg total) by mouth daily.   erythromycin ophthalmic ointment Place 1 application into the left eye at bedtime.   ferrous sulfate 325 (65 FE) MG tablet Take 1 tablet (325 mg total) by mouth daily.   furosemide 20 MG tablet Commonly known as: LASIX Take 1 tablet (20 mg total) by mouth daily.   levothyroxine 75 MCG tablet Commonly known as: SYNTHROID TAKE 1 TABLET EVERY DAY   LORazepam 0.5 MG tablet Commonly known as: ATIVAN TAKE 1/2 TABLET BY MOUTH EVERY 8 HOURS AS NEEDED   losartan 100 MG tablet Commonly known as: COZAAR TAKE 1 TABLET EVERY DAY   pantoprazole 40 MG tablet Commonly  known as: PROTONIX Take 1 tablet (40 mg total) by mouth daily.   potassium chloride 10 MEQ tablet Commonly known as: K-DUR Take 2 tablets (20 mEq total) by mouth daily.   traZODone 50 MG tablet Commonly known as: DESYREL TAKE 1 TABLET AT BEDTIME AS NEEDED FOR SLEEP What changed:   reasons to take this  additional instructions   zolpidem 5 MG tablet Commonly known as: AMBIEN Take 1 tablet (5 mg total) by mouth at bedtime as needed for sleep. Stop ativan        Outstanding Labs/Studies   None   Duration of Discharge Encounter   Greater than 30 minutes including physician time.  SignedAngelena Form, PA-C 04/21/2019, 12:40 PM [phone]

## 2019-04-22 ENCOUNTER — Telehealth: Payer: Self-pay | Admitting: Physician Assistant

## 2019-04-22 ENCOUNTER — Other Ambulatory Visit: Payer: Self-pay | Admitting: Physician Assistant

## 2019-04-22 MED ORDER — OXYCODONE HCL 5 MG PO TABS: 5.0000 mg | ORAL_TABLET | Freq: Three times a day (TID) | ORAL | 0 refills | Status: DC | PRN

## 2019-04-22 MED FILL — Magnesium Sulfate Inj 50%: INTRAMUSCULAR | Qty: 10 | Status: AC

## 2019-04-22 MED FILL — Heparin Sodium (Porcine) Inj 1000 Unit/ML: INTRAMUSCULAR | Qty: 30 | Status: AC

## 2019-04-22 MED FILL — Potassium Chloride Inj 2 mEq/ML: INTRAVENOUS | Qty: 40 | Status: AC

## 2019-04-22 NOTE — Telephone Encounter (Signed)
  [place] VALVE TEAM   Patient contacted regarding discharge from [facility] on 9/16  Patient understands to follow up with provider Nell Range on 9/23 at [place].  Patient understands discharge instructions? yes Patient understands medications and regimen? yes Patient understands to bring all medications to this visit? yes  Patient having pain in her anterior sides (not in her groins) that is worse with coughing. She said cannot take tylenol due to her cirrhosis or ibuprofen given DAPT and needs something stronger for pain. I told her it is uncommon that our patients have pain after TAVR but that I would call in a few pills of oxycontins. If she continues to have significant pain we will need to bring her in for evaluation. Her groins looked good on my physical exam yesterday.   Angelena Form PA-C  [facility]

## 2019-04-23 ENCOUNTER — Telehealth: Payer: Self-pay | Admitting: Physician Assistant

## 2019-04-23 NOTE — Telephone Encounter (Signed)
  [place] VALVE TEAM  Patient called into the office to report worsening shortness of breath. It is worse when she lays flat and with exertion. No real swelling or weight gain that she can tell. I suspect some mild volume overload with recent surgery. Plan to increase lasix/KDur from 48m daily to 471mdaily x 3 days. I have asked her to call me on Monday to report how she is doing. I will check labs in office Wednesday during our appointment.   KaAngelena FormA-C  [facility]

## 2019-04-26 NOTE — Progress Notes (Signed)
[facility]                                       Cardiology Office Note    Date:  04/28/2019   ID:  Kelly [name], DOB 02/07/1951, MRN [id number]  PCP:  [name], MD  Cardiologist: [name], MD / Dr. Angelena Form & Dr. Roxy Manns (TAVR)  CC: TOC s/p TAVR  History of Present Illness:  Kelly [name] is a 69 y.o. female with a history of moderate obesity, CAD s/p recent PCI x2, HTN, HLD,NASHcirrhosis with portal hypertension (confirmed by recent abdominal US), esophageal varices, DMT2, tobacco abuse, anemia on iron infusions, anxiety, dementia, and severeAS s/p TAVR (04/20/19) who presents to clinic for follow up  Patient has known of presence of a heart murmur for several years and was initially evaluated by Dr. Angelena Form in 2018. Echocardiogram performed at that time revealed normal left ventricular systolic function with moderate aortic stenosis. She has been followed regularly ever since. She was seen in follow-up recently and reported progressive symptoms of exertional shortness of breath and fatigue with occasional mild dizzy spells. Follow-up echocardiogram revealed moderately severe aortic stenosis with peak velocity across the aortic valve measured 4.78ms corresponding to mean transvalvular gradient estimated 37 mmHg. The DVI was reported 0.31 with aortic valve area calculated 0.9 cm. Left ventricular systolic function remain normal with ejection fraction estimated 60 to 65%. The patient subsequently underwent diagnostic cardiac catheterization which confirmed the presence of severe aortic stenosis with mean transvalvular gradient measured 41 mmHg at catheterization. The patient was found to have triple vessel CAD 70% pRCA, 95% RPDA, 40% mRCA, 50% post Atrio, 95% pLAD, 80% D1, 99% 2nd Mrg, 50% mid-dist LCx. Due to the covid pandemic her work up was delayed. [facility] and was set up for TAVR at the end of July.  However, this was ultimately canceled after preadmission lab work showed a drop in her hemoglobin to 7.6.  She was referred to heme-onc and started on iron infusions.   She underwent successful TAVR with a253mMedtronic Evolut ProTHV via the TF approach on9/15/20. She had transient CHB after valve deployment and new LBBB after TAVR. Post operative echo showed EF >65% and an elevated mean gradient of 24 mm Hg (likley 2/2 vigorous LV function and PPM given large body habitus). She was discharged home on aspirin and plavix as well with as a zio patch.  She called into the office the day after surgery asking for pain meds for groin pain. I was concerned as significant groin pain is uncommon after TAVR. I called in percocets and asked her to come into the ER if pain continued or worsened.   She called into the office last Friday with some worsening shortness of breath and orthopnea. I asked her increase her lasix to 402maily for 3 days.  Today she presents to clinic for follow up. She is doing well. Still has some weakness. No CP or SOB. No LE edema, orthopnea or PND. No dizziness or syncope. She has had some blood in her stool and has follow up with her GI doctor soon. No palpitations. Breathing back to baseline after increased lasix. Now back on 71m50mily. Groin pain resolved.     Past Medical History:  Diagnosis Date  Anxiety    Chronic upper GI bleeding    intestinal AVM's, portal gastropathy, esophageal varices on egd 04/2018   Coronary artery disease involving native coronary artery without angina pectoris    Dementia ([employer]) 09/16/2016   Depression    DIVERTICULITIS OF COLON 06/14/2008   Qualifier: Diagnosis of  By: Gretta Cool RN, Malachy Mood     DM (diabetes mellitus), type 2 ([place]) 09/15/2016   Esophageal varices determined by endoscopy ([place])    Hyperlipidemia    Hypertension    Hypothyroidism    Incidental pulmonary  nodule, greater than or equal to 74m 10/14/2018   LLL nodule needs f/u CT or PET-CT scan   Irritable bowel syndrome 04/06/2008   Centricity Description: IBS Qualifier: Diagnosis of  By: [name] (ADeborra Medina, [name]   Centricity Description: IRRITABLE BOWEL SYNDROME Qualifier: Diagnosis of  By: [name], [name]   Liver cirrhosis secondary to NASH [place] 09/16/2016   NASH (nonalcoholic steatohepatitis)    Obesity    Portal hypertensive gast[place]    S/P TAVR (transcatheter aortic valve replacement) 04/20/2019   a. 04/20/19: s/p TAVR wtih a 238mMedtronic CoreValve Evolut Pro via the TF approach   Severe aortic stenosis    Splenomegaly 09/16/2016   Thrombocytopenia ([place]   Thyroid disease    Tobacco abuse    Vitamin D deficiency     Past Surgical History:  Procedure Laterality Date   CARDIAC CATHETERIZATION     COLONOSCOPY WITH PROPOFOL N/A 04/09/2018   Procedure: COLONOSCOPY WITH PROPOFOL;  Surgeon: JaMilus BanisterMD;  [facility]: [facility];  Service: Endoscopy;  Laterality: N/A;   CORONARY ANGIOPLASTY     CORONARY STENT INTERVENTION N/A 02/11/2019   Procedure: CORONARY STENT INTERVENTION;  Surgeon: McBurnell BlanksMD;  [facility];  Service: Cardiovascular;  Laterality: N/A;  Prox RCA Prox LAD   ESOPHAGOGASTRODUODENOSCOPY (EGD) WITH PROPOFOL N/A 04/09/2018   Procedure: ESOPHAGOGASTRODUODENOSCOPY (EGD) WITH PROPOFOL;  Surgeon: JaMilus BanisterMD;  [facility];  Service: Endoscopy;  Laterality: N/A;   oopherectomy     OVARIAN CYST REMOVAL     POLYPECTOMY  04/09/2018   Procedure: POLYPECTOMY;  Surgeon: JaMilus BanisterMD;  [facility]: [facility];  Service: Endoscopy;;   RIGHT/LEFT HEART CATH AND CORONARY ANGIOGRAPHY N/A 10/07/2018   Procedure: RIGHT/LEFT HEART CATH AND CORONARY ANGIOGRAPHY;  Surgeon: McBurnell BlanksMD;  [facility];  Service: Cardiovascular;  Laterality: N/A;   TEE WITHOUT CARDIOVERSION  N/A 04/20/2019   Procedure: TRANSESOPHAGEAL ECHOCARDIOGRAM (TEE);  Surgeon: McBurnell BlanksMD;  [facility]: [facility];  Laterality: N/A;   TRANSCATHETER AORTIC VALVE REPLACEMENT, TRANSFEMORAL N/A 04/20/2019   Procedure: TRANSCATHETER AORTIC VALVE REPLACEMENT, TRANSFEMORAL approach using 2312mvolut PRO+ medtronic valve;  Surgeon: McABurnell BlanksD;  [facility];  Laterality: N/A;    Current Medications: Outpatient Medications Prior to Visit  Medication Sig Dispense Refill   ACCU-CHEK AVIVA PLUS test strip      ACCU-CHEK SOFTCLIX LANCETS lancets      amLODipine (NORVASC) 10 MG tablet Take 1 tablet (10 mg total) by mouth daily. 90 tablet 3   aspirin EC 81 MG tablet Take 1 tablet (81 mg total) by mouth daily. 90 tablet 3   atorvastatin (LIPITOR) 20 MG tablet TAKE 1 TABLET EVERY DAY 90 tablet 3   Blood Glucose Monitoring Suppl (ACCU-CHEK AVIVA PLUS) w/Device KIT      clopidogrel (  PLAVIX) 75 MG tablet Take 1 tablet (75 mg total) by mouth daily. 90 tablet 3   erythromycin ophthalmic ointment Place 1 application into the left eye at bedtime. 3.5 g 0   ferrous sulfate 325 (65 FE) MG tablet Take 1 tablet (325 mg total) by mouth daily. 90 tablet 3   furosemide (LASIX) 20 MG tablet Take 1 tablet (20 mg total) by mouth daily. 90 tablet 1   levothyroxine (SYNTHROID, LEVOTHROID) 75 MCG tablet TAKE 1 TABLET EVERY DAY 90 tablet 3   LORazepam (ATIVAN) 0.5 MG tablet TAKE 1/2 TABLET BY MOUTH EVERY 8 HOURS AS NEEDED 10 tablet 0   losartan (COZAAR) 100 MG tablet TAKE 1 TABLET EVERY DAY 90 tablet 1   methylcellulose (CITRUCEL) oral powder Take 1 packet by mouth daily as needed (constipation).     oxyCODONE (ROXICODONE) 5 MG immediate release tablet Take 1 tablet (5 mg total) by mouth every 8 (eight) hours as needed. 10 tablet 0   pantoprazole (PROTONIX) 40 MG tablet Take 1 tablet (40 mg total) by mouth daily. 90 tablet 3     potassium chloride (K-DUR) 10 MEQ tablet Take 2 tablets (20 mEq total) by mouth daily. 180 tablet 3   traZODone (DESYREL) 50 MG tablet TAKE 1 TABLET AT BEDTIME AS NEEDED FOR SLEEP 90 tablet 2   zolpidem (AMBIEN) 5 MG tablet Take 1 tablet (5 mg total) by mouth at bedtime as needed for sleep. Stop ativan 30 tablet 1   Facility-Administered Medications Prior to Visit  Medication Dose Route Frequency Provider Last Rate Last Dose   cyanocobalamin ((VITAMIN B-12)) injection 1,000 mcg  1,000 mcg Subcutaneous Q30 days [name], MD   1,000 mcg at 09/08/17 1408     Allergies:   Prednisone, Celecoxib, Macrodantin [nitrofurantoin macrocrystal], and Penicillins   Social History   Socioeconomic History   Marital status: Married    Spouse name: Not on file   Number of children: 1   Years of education: Not on file   Highest education level: Not on file  Occupational History   Not on file  Social Needs   Financial resource strain: Not on file   Food insecurity    Worry: Not on file    Inability: Not on file   Transportation needs    Medical: Not on file    Non-medical: Not on file  Tobacco Use   Smoking status: Current Every Day Smoker    Packs/day: 0.50    Types: Cigarettes   Smokeless tobacco: Never Used  Substance and Sexual Activity   Alcohol use: No   Drug use: Not Currently    Types: Benzodiazepines   Sexual activity: Not Currently  Lifestyle   Physical activity    Days per week: Not on file    Minutes per session: Not on file   Stress: Not on file  Relationships   Social connections    Talks on phone: Not on file    Gets together: Not on file    Attends religious service: Not on file    Active member of club or organization: Not on file    Attends meetings of clubs or organizations: Not on file    Relationship status: Not on file  Other Topics Concern   Not on file  Social History Narrative   Not on file     Family History:  The  patient's family history includes Alcohol abuse in her father; Alzheimer's disease in her mother; Dementia in her mother;  Heart disease in her father; Lung cancer in her father; Other in her mother.     ROS:   Please see the history of present illness.    ROS All other systems reviewed and are negative.   PHYSICAL EXAM:   VS:  BP 104/62    Pulse 62    Ht 5' 4.5" (1.638 m)    Wt 208 lb (94.3 kg)    BMI 35.15 kg/m    GEN: Well nourished, well developed, in no acute distress, obese HEENT: normal Neck: no JVD or masses Cardiac: RRR; 2/6 SEM @ RUSB. No rubs, or gallops,no edema  Respiratory:  clear to auscultation bilaterally, normal work of breathing GI: soft, nontender, nondistended, + BS MS: no deformity or atrophy Skin: warm and dry, no rash Neuro:  Alert and Oriented x 3, Strength and sensation are intact Psych: euthymic mood, full affect   Wt Readings from Last 3 Encounters:  04/28/19 208 lb (94.3 kg)  04/21/19 220 lb 14.4 oz (100.2 kg)  04/16/19 211 lb 4.8 oz (95.8 kg)      Studies/Labs Reviewed:   EKG:  EKG is ordered today.  The ekg ordered today demonstrates sinus rhythm with occasional PVCs, LBBB with QRS 193m. HR 62   Recent Labs: 04/16/2019: ALT 18; B Natriuretic Peptide 132.0 04/21/2019: BUN 9; Creatinine, Ser 0.76; Hemoglobin 8.0; Magnesium 1.7; Platelets 90; Potassium 4.2; Sodium 139   Lipid Panel    Component Value Date/Time   CHOL 140 09/21/2018 1203   TRIG 130 09/21/2018 1203   HDL 45 (L) 09/21/2018 1203   CHOLHDL 3.1 09/21/2018 1203   VLDL 20 03/26/2017 0908   LDLCALC 74 09/21/2018 1203    Additional studies/ records that were reviewed today include:  TAVR OPERATIVE NOTE   Date of Procedure:04/20/2019  Preoperative Diagnosis:Severe Aortic Stenosis   Postoperative Diagnosis:Same   Procedure:   Transcatheter Aortic Valve Replacement - PercutaneousRightTransfemoral Approach Medtronic CoreValve  Evolut Pro (size 263m serial # [id number] Co-Surgeons:Christopher McAngelena FormMD andClarence H. OwRoxy MannsMD  Anesthesiologist:Adam HoMarcie BalMD  Echocardiographer:Peter NiJohnsie CancelMD  Pre-operative Echo Findings: ? Severe aortic stenosis ? Normalleft ventricular systolic function  Post-operative Echo Findings: ? Noparavalvular leak ? Normalleft ventricular systolic function  __________________   Echo 04/21/19: IMPRESSIONS  1. The left ventricle has hyperdynamic systolic function, with an ejection fraction of >65%. The cavity size was normal. There is moderately increased left ventricular wall thickness. Left ventricular diastolic Doppler parameters are consistent with  impaired relaxation.  2. The right ventricle has normal systolic function. The cavity was normal. There is No increase in right ventricular wall thickness.  3. Left atrial size was normal.  4. Right atrial size was normal.  5. No evidence of mitral valve stenosis.  6. A 2346m Medtronic CoreValve-Evolut Pro bioprosthetic aortic valve (TAVR) valve is present in the aortic position.  7. Aortic valve regurgitation was not visualized by color flow Doppler. Moderate stenosis of the aortic valve.  8. Pulmonic valve regurgitation is not visualized by color flow Doppler.  9. The aorta is normal unless otherwise noted.    ASSESSMENT & PLAN:   Severe AS s/p TAVR:groin sites are healing well. ECG with persistent LBBB (QRS 156 ms) but no HAVB. SBE prophylaxis discussed; I have RX'd clindamycin due to a PCN allergy. I will see her back at 1 month with an echo.   New LBBB: QRS 152 ms. Zio patch in place.   [id number].  Contnue Aspirin and plavix.  HTN: BP well controlled today  Chronic disatolic CHF: appears euvolemic. Will get a BMET given recent extra lasix.   Pulmonary nodule: pre TAVR scans showed  a 10 x 11 mm left lower lobe pulmonary nodule, new compared to remote prior study from 2009. Consider one of the following in 3 months for both low-risk and high-risk individuals: (a) repeat chest CT or (b) follow-up PET-CT.  Will plan to order PET CT now.  Tobacco abuse: she would like to stop smoking. We will trial Chantix.    Medication Adjustments/Labs and Tests Ordered: Current medicines are reviewed at length with the patient today.  Concerns regarding medicines are outlined above.  Medication changes, Labs and Tests ordered today are listed in the Patient Instructions below. Patient Instructions  Medication Instructions:  1) START CHANTIX. You have been given a prescription for a starter pack and a 1 month continuation pack. Follow directions as given on the pill packs.  2) Your provider discussed the importance of taking an antibiotic prior to all dental visits to prevent damage to the heart valves from infection. You were given a prescription for CLINDAMYCIN 600 mg to take one hour prior to any dental appointment.   Labwork: TODAY: BMET  Testing/Procedures: Katie recommends you have a PET SCAN.  Follow-Up: Please keep your other appointments on 10/8 as scheduled.     Signed, Angelena Form, PA-C  04/28/2019 3:14 PM    [facility] [place], [place],   [address] Phone: [phone]; Fax: [phone]

## 2019-04-28 ENCOUNTER — Ambulatory Visit (INDEPENDENT_AMBULATORY_CARE_PROVIDER_SITE_OTHER): Payer: Medicare HMO | Admitting: Physician Assistant

## 2019-04-28 ENCOUNTER — Other Ambulatory Visit: Payer: Self-pay

## 2019-04-28 ENCOUNTER — Telehealth: Payer: Self-pay | Admitting: Physician Assistant

## 2019-04-28 ENCOUNTER — Encounter: Payer: Self-pay | Admitting: Physician Assistant

## 2019-04-28 VITALS — BP 104/62 | HR 62 | Ht 64.5 in | Wt 208.0 lb

## 2019-04-28 DIAGNOSIS — I2510 Atherosclerotic heart disease of native coronary artery without angina pectoris: Secondary | ICD-10-CM | POA: Diagnosis not present

## 2019-04-28 DIAGNOSIS — I10 Essential (primary) hypertension: Secondary | ICD-10-CM

## 2019-04-28 DIAGNOSIS — Z952 Presence of prosthetic heart valve: Secondary | ICD-10-CM

## 2019-04-28 DIAGNOSIS — Z720 Tobacco use: Secondary | ICD-10-CM | POA: Diagnosis not present

## 2019-04-28 DIAGNOSIS — R911 Solitary pulmonary nodule: Secondary | ICD-10-CM

## 2019-04-28 DIAGNOSIS — I447 Left bundle-branch block, unspecified: Secondary | ICD-10-CM | POA: Diagnosis not present

## 2019-04-28 DIAGNOSIS — I5032 Chronic diastolic (congestive) heart failure: Secondary | ICD-10-CM

## 2019-04-28 MED ORDER — VARENICLINE TARTRATE 1 MG PO TABS: 1.0000 mg | ORAL_TABLET | Freq: Two times a day (BID) | ORAL | 0 refills | Status: DC

## 2019-04-28 MED ORDER — CLINDAMYCIN HCL 300 MG PO CAPS: ORAL_CAPSULE | ORAL | 8 refills | Status: DC

## 2019-04-28 MED ORDER — CHANTIX STARTING MONTH PAK 0.5 MG X 11 & 1 MG X 42 PO TABS: ORAL_TABLET | ORAL | 0 refills | Status: DC

## 2019-04-28 NOTE — Patient Instructions (Addendum)
Medication Instructions:  1) START CHANTIX. You have been given a prescription for a starter pack and a 1 month continuation pack. Follow directions as given on the pill packs.  2) Your provider discussed the importance of taking an antibiotic prior to all dental visits to prevent damage to the heart valves from infection. You were given a prescription for CLINDAMYCIN 600 mg to take one hour prior to any dental appointment.   Labwork: TODAY: BMET  Testing/Procedures: Katie recommends you have a PET SCAN.  Follow-Up: Please keep your other appointments on 10/8 as scheduled.

## 2019-04-28 NOTE — Telephone Encounter (Signed)
° °  Courtesy call from [place] [phone] Patient is reaching the limit of automatic triggers, the monitor will still record events when patient pushes the trigger. If additional time is needed on monitor please call.  Use ref # [id number]

## 2019-04-29 LAB — BASIC METABOLIC PANEL
BUN/Creatinine Ratio: 9 — ABNORMAL LOW (ref 12–28)
BUN: 8 mg/dL (ref 8–27)
CO2: 23 mmol/L (ref 20–29)
Calcium: 9.1 mg/dL (ref 8.7–10.3)
Chloride: 107 mmol/L — ABNORMAL HIGH (ref 96–106)
Creatinine, Ser: 0.93 mg/dL (ref 0.57–1.00)
GFR calc Af Amer: 72 mL/min/{1.73_m2} (ref >59)
GFR calc non Af Amer: 62 mL/min/{1.73_m2} (ref >59)
Glucose: 183 mg/dL — ABNORMAL HIGH (ref 65–99)
Potassium: 3.8 mmol/L (ref 3.5–5.2)
Sodium: 141 mmol/L (ref 134–144)

## 2019-04-29 NOTE — Telephone Encounter (Signed)
Thank you. I talked to Kelly [name] and she will investigate what is triggering the machine. I do not think she needs another monitor at this time.

## 2019-04-30 ENCOUNTER — Telehealth: Payer: Self-pay | Admitting: Physician Assistant

## 2019-04-30 NOTE — Telephone Encounter (Signed)
New Message  [employer] [employer] calling from [facility] is requesting the Peer to Peer to be done on the behalf of the patient. Peer to peer can be completed at phone number [phone]. Please assist.

## 2019-04-30 NOTE — Telephone Encounter (Signed)
Done. They will fax over authorization. Thank you.  KT

## 2019-05-10 ENCOUNTER — Ambulatory Visit (HOSPITAL_COMMUNITY)
Admission: RE | Admit: 2019-05-10 | Discharge: 2019-05-10 | Disposition: A | Discharge status: Home / Self Care | Payer: Medicare HMO | Source: Ambulatory Visit | Attending: Physician Assistant | Admitting: Physician Assistant

## 2019-05-10 ENCOUNTER — Other Ambulatory Visit: Payer: Self-pay

## 2019-05-10 DIAGNOSIS — R918 Other nonspecific abnormal finding of lung field: Secondary | ICD-10-CM | POA: Diagnosis not present

## 2019-05-10 DIAGNOSIS — I10 Essential (primary) hypertension: Secondary | ICD-10-CM | POA: Diagnosis not present

## 2019-05-10 DIAGNOSIS — E119 Type 2 diabetes mellitus without complications: Secondary | ICD-10-CM | POA: Insufficient documentation

## 2019-05-10 DIAGNOSIS — E785 Hyperlipidemia, unspecified: Secondary | ICD-10-CM | POA: Diagnosis not present

## 2019-05-10 DIAGNOSIS — I2510 Atherosclerotic heart disease of native coronary artery without angina pectoris: Secondary | ICD-10-CM | POA: Diagnosis not present

## 2019-05-10 DIAGNOSIS — K7581 Nonalcoholic steatohepatitis (NASH): Secondary | ICD-10-CM | POA: Insufficient documentation

## 2019-05-10 DIAGNOSIS — R911 Solitary pulmonary nodule: Secondary | ICD-10-CM | POA: Diagnosis not present

## 2019-05-10 DIAGNOSIS — R161 Splenomegaly, not elsewhere classified: Secondary | ICD-10-CM | POA: Diagnosis not present

## 2019-05-10 LAB — GLUCOSE, CAPILLARY: Glucose-Capillary: 191 mg/dL — ABNORMAL HIGH (ref 70–99)

## 2019-05-10 MED ORDER — FLUDEOXYGLUCOSE F - 18 (FDG) INJECTION
9.3000 | Freq: Once | INTRAVENOUS | Status: AC
  Administered 2019-05-10: 12:00:00 9.3 via INTRAVENOUS

## 2019-05-11 NOTE — Progress Notes (Signed)
[facility] [place]                                       Cardiology Office Note    Date:  05/14/2019   ID:  Kelly [name], DOB [birth year], MRN [id number]  PCP:  [name], MD  Cardiologist:  [name], MD/Dr. Angelena Form & Dr. Roxy Manns (TAVR)  CC: 1 month s/p TAVR  History of Present Illness:  Kelly [name] is a 68 y.o. 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 Angelena Form in 2018. Echocardiogram performed at that time revealed normal left ventricular systolic function with moderate aortic stenosis. She has been followed regularly ever since. She was seen in follow-up recently and reported progressive symptoms of exertional shortness of breath and fatigue with occasional mild dizzy spells. Follow-up echocardiogram revealed moderately severe aortic stenosis with peak velocity across the aortic valve measured 4.43ms corresponding to mean transvalvular gradient estimated 37 mmHg. The DVI was reported 0.31 with aortic valve area calculated 0.9 cm. Left ventricular systolic function remain normal with ejection fraction estimated 60 to 65%. The patient subsequently underwent diagnostic cardiac catheterization which confirmed the presence of severe aortic stenosis with mean transvalvular gradient measured 41 mmHg at catheterization. The patient was foundto havetriple vessel CAD 70% pRCA, 95% RPDA, 40% mRCA, 50% post Atrio, 95% pLAD, 80% D1, 99% 2ndMrg, 50% mid-dist LCx. Due to the covid pandemic her work up was delayed. [facility] and was set up for TAVR at the end of July.However, this was ultimately canceled after preadmission lab work showed a drop in her hemoglobin to 7.6.She was referred to heme-onc and started on iron infusions.   She underwent successful TAVR with a227mMedtronic Evolut ProTHV via the TF approach on9/15/20. She had transient CHB after valve deployment and new LBBB after TAVR. Post operative echo showed EF >65% and an elevated mean gradient of 24 mm Hg (likley 2/2 vigorous LV function and PPM given large body habitus). She was discharged home on aspirin and plavix as well with as a zio patch.  Today she presents to clinic for follow up. She mostly complains of back pain that radiates up to her neck. No CP. Still has some SOB, but improved since TAVR. No LE edema, orthopnea or PND. No dizziness or syncope. No blood in stool or urine. No palpitations.      Past Medical History:  Diagnosis Date  . Anxiety   . Chronic upper GI bleeding    intestinal AVM's, portal gastropathy, esophageal varices on egd 04/2018  . Coronary artery disease involving native coronary artery without angina pectoris   . Dementia (HCBradford2/07/2017  . Depression   . DIVERTICULITIS OF COLON 06/14/2008   Qualifier: Diagnosis of  By: [name]  . DM (diabetes mellitus), type 2 (HCBenson2/06/2017  . Esophageal varices determined by endoscopy (HCEatons Neck  . Hyperlipidemia   . Hypertension   . Hypothyroidism   . Incidental pulmonary nodule, greater than or  equal to 65m 10/14/2018   LLL nodule needs f/u CT or PET-CT scan  . Irritable bowel syndrome 04/06/2008   Centricity Description: IBS Qualifier: Diagnosis of  By: [name] (ADeborra Medina, [name]   Centricity Description: IRRITABLE BOWEL SYNDROME Qualifier: Diagnosis of  By: [name], [name]  . Liver cirrhosis secondary to NASH ([place]Highland[place] 09/16/2016  . NASH (nonalcoholic steatohepatitis)   . Obesity   . Portal hypertensive gastropathy (HDana   . 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 04/20/19: s/p TAVR wtih a 238mMedtronic CoreValve Evolut Pro via the TF approach  . Severe aortic stenosis   . Splenomegaly 09/16/2016  . Thrombocytopenia (HCPorters Neck  . Thyroid disease   . Tobacco abuse   . Vitamin D deficiency     Past Surgical History:  Procedure Laterality Date  . CARDIAC CATHETERIZATION    . COLONOSCOPY WITH PROPOFOL N/A 04/09/2018   Procedure: COLONOSCOPY WITH PROPOFOL;  Surgeon: JaMilus BanisterMD;  [facility];  Service: Endoscopy;  Laterality: N/A;  . CORONARY ANGIOPLASTY    . CORONARY STENT INTERVENTION N/A 02/11/2019   Procedure: CORONARY STENT INTERVENTION;  Surgeon: McBurnell BlanksMD;  [facility];  Service: Cardiovascular;  Laterality: N/A;  Prox RCA Prox LAD  . ESOPHAGOGASTRODUODENOSCOPY (EGD) WITH PROPOFOL N/A 04/09/2018   Procedure: ESOPHAGOGASTRODUODENOSCOPY (EGD) WITH PROPOFOL;  Surgeon: JaMilus BanisterMD;  [facility];  Service: Endoscopy;  Laterality: N/A;  . oopherectomy    . OVARIAN CYST REMOVAL    . POLYPECTOMY  04/09/2018   Procedure: POLYPECTOMY;  Surgeon: JaMilus BanisterMD;  [facility]: [facility];  Service: Endoscopy;;  . RIGHT/LEFT HEART CATH AND CORONARY ANGIOGRAPHY N/A 10/07/2018   Procedure: RIGHT/LEFT HEART CATH AND CORONARY ANGIOGRAPHY;  Surgeon: McBurnell BlanksMD;  [facility];  Service: Cardiovascular;  Laterality: N/A;  . TEE WITHOUT CARDIOVERSION N/A 04/20/2019   Procedure: TRANSESOPHAGEAL ECHOCARDIOGRAM (TEE);  Surgeon: McBurnell BlanksMD;  [facility] Service: Open Heart Surgery;  Laterality: N/A;  . TRANSCATHETER AORTIC VALVE REPLACEMENT, TRANSFEMORAL N/A 04/20/2019   Procedure: TRANSCATHETER AORTIC VALVE REPLACEMENT, TRANSFEMORAL approach using 2386mvolut PRO+ medtronic valve;  Surgeon: McABurnell BlanksD;  [facility];  Laterality: N/A;     Current Medications: Outpatient Medications Prior to Visit  Medication Sig Dispense Refill  . ACCU-CHEK AVIVA PLUS test strip     . ACCU-CHEK SOFTCLIX LANCETS lancets     . amLODipine (NORVASC) 10 MG tablet Take 1 tablet (10 mg total) by mouth daily. 90 tablet 3  . aspirin EC 81 MG tablet Take 1 tablet (81 mg total) by mouth daily. 90 tablet 3  . atorvastatin (LIPITOR) 20 MG tablet TAKE 1 TABLET EVERY DAY 90 tablet 3  . Blood Glucose Monitoring Suppl (ACCU-CHEK AVIVA PLUS) w/Device KIT     . clindamycin (CLEOCIN) 300 MG capsule Take 2 capsules (600 mg) 1 hour prior to all dental visits. 4 capsule 8  . clopidogrel (PLAVIX) 75 MG tablet Take 1 tablet (75 mg total) by mouth daily. 90 tablet 3  . ferrous sulfate 325 (65 FE) MG tablet Take 1 tablet (325 mg total) by mouth daily. 90 tablet 3  . furosemide (LASIX) 20 MG tablet Take 1 tablet (20 mg total) by mouth daily. 90 tablet 1  . levothyroxine (SYNTHROID, LEVOTHROID) 75 MCG tablet TAKE 1 TABLET EVERY DAY 90 tablet 3  . losartan (COZAAR)  100 MG tablet TAKE 1 TABLET EVERY DAY 90 tablet 1  . pantoprazole (PROTONIX) 40 MG tablet Take 1 tablet (40 mg total) by mouth daily. 90 tablet 3  . potassium chloride (K-DUR) 10 MEQ tablet Take 2 tablets (20 mEq total) by mouth daily. 180 tablet 3  . traZODone (DESYREL) 50 MG tablet TAKE 1 TABLET AT BEDTIME AS NEEDED FOR SLEEP 90 tablet 2  . zolpidem (AMBIEN) 5 MG tablet Take 1 tablet (5 mg total) by mouth at bedtime as needed for sleep. Stop ativan 30 tablet 1  . erythromycin ophthalmic ointment Place 1 application into the left eye at bedtime. (Patient not taking: Reported on 05/13/2019) 3.5 g 0  . LORazepam (ATIVAN) 0.5 MG tablet TAKE 1/2 TABLET BY MOUTH EVERY 8 HOURS AS NEEDED (Patient not taking: Reported on 05/13/2019) 10 tablet 0  . methylcellulose (CITRUCEL) oral powder Take 1 packet by mouth daily as needed (constipation).    Marland Kitchen oxyCODONE (ROXICODONE) 5 MG immediate release tablet Take 1 tablet (5 mg  total) by mouth every 8 (eight) hours as needed. (Patient not taking: Reported on 05/13/2019) 10 tablet 0  . varenicline (CHANTIX CONTINUING MONTH PAK) 1 MG tablet Take 1 tablet (1 mg total) by mouth 2 (two) times daily. (Patient not taking: Reported on 05/13/2019) 60 tablet 0  . varenicline (CHANTIX STARTING MONTH PAK) 0.5 MG X 11 & 1 MG X 42 tablet Take one 0.5 mg tablet by mouth once daily for 3 days, then increase to one 0.5 mg tablet twice daily for 4 days, then increase to one 1 mg tablet twice daily. (Patient not taking: Reported on 05/13/2019) 53 tablet 0   Facility-Administered Medications Prior to Visit  Medication Dose Route Frequency Provider Last Rate Last Dose  . cyanocobalamin ((VITAMIN B-12)) injection 1,000 mcg  1,000 mcg Subcutaneous Q30 days [name], MD   1,000 mcg at 09/08/17 1408     Allergies:   Prednisone, Celecoxib, Macrodantin [nitrofurantoin macrocrystal], and Penicillins   Social History   Socioeconomic History  . Marital status: Married    Spouse name: Not on file  . Number of children: 1  . Years of education: Not on file  . Highest education level: Not on file  Occupational History  . Not on file  Social Needs  . Financial resource strain: Not on file  . Food insecurity    Worry: Not on file    Inability: Not on file  . Transportation needs    Medical: Not on file    Non-medical: Not on file  Tobacco Use  . Smoking status: Current Every Day Smoker    Packs/day: 0.50    Types: Cigarettes  . Smokeless tobacco: Never Used  Substance and Sexual Activity  . Alcohol use: No  . Drug use: Not Currently    Types: Benzodiazepines  . Sexual activity: Not Currently  Lifestyle  . Physical activity    Days per week: Not on file    Minutes per session: Not on file  . Stress: Not on file  Relationships  . Social Herbalist on phone: Not on file    Gets together: Not on file    Attends religious service: Not on file    Active member of  club or organization: Not on file    Attends meetings of clubs or organizations: Not on file    Relationship status: Not on file  Other Topics Concern  . Not on file  Social History Narrative  .  Not on file     Family History:  The patient's family history includes Alcohol abuse in her father; Alzheimer's disease in her mother; Dementia in her mother; Heart disease in her father; Lung cancer in her father; Other in her mother.     ROS:   Please see the history of present illness.    ROS All other systems reviewed and are negative.   PHYSICAL EXAM:   VS:  BP 122/68   Pulse 61   Ht 5' 4.5" (1.638 m)   Wt 211 lb 1.9 oz (95.8 kg)   SpO2 97%   BMI 35.68 kg/m    GEN: Well nourished, well developed, in no acute distress, obese HEENT: normal Neck: no JVD or masses Cardiac: RRR; soft flow murmur. No rubs, or gallops,no edema  Respiratory:  clear to auscultation bilaterally, normal work of breathing GI: soft, nontender, nondistended, + BS MS: no deformity or atrophy Skin: warm and dry, no rash Neuro:  Alert and Oriented x 3, Strength and sensation are intact Psych: euthymic mood, full affect   Wt Readings from Last 3 Encounters:  05/13/19 211 lb 1.9 oz (95.8 kg)  04/28/19 208 lb (94.3 kg)  04/21/19 220 lb 14.4 oz (100.2 kg)      Studies/Labs Reviewed:   EKG:  EKG is NOT ordered today.    Recent Labs: 04/16/2019: ALT 18; B Natriuretic Peptide 132.0 04/21/2019: Hemoglobin 8.0; Magnesium 1.7; Platelets 90 04/28/2019: BUN 8; Creatinine, Ser 0.93; Potassium 3.8; Sodium 141   Lipid Panel    Component Value Date/Time   CHOL 140 09/21/2018 1203   TRIG 130 09/21/2018 1203   HDL 45 (L) 09/21/2018 1203   CHOLHDL 3.1 09/21/2018 1203   VLDL 20 03/26/2017 0908   LDLCALC 74 09/21/2018 1203    Additional studies/ records that were reviewed today include:  TAVR OPERATIVE NOTE   Date of Procedure:04/20/2019  Preoperative Diagnosis:Severe Aortic Stenosis    Postoperative Diagnosis:Same   Procedure:   Transcatheter Aortic Valve Replacement - PercutaneousRightTransfemoral Approach Medtronic CoreValve Evolut Pro (size 43m, serial # [id number]  Co-Surgeons:Christopher MAngelena Form MD andClarence H. ORoxy Manns MD  Anesthesiologist:Adam HMarcie Bal MD  Echocardiographer:Peter NJohnsie Cancel MD  Pre-operative Echo Findings: ? Severe aortic stenosis ? Normalleft ventricular systolic function  Post-operative Echo Findings: ? Noparavalvular leak ? Normalleft ventricular systolic function  __________________   Echo 04/21/19: IMPRESSIONS 1. The left ventricle has hyperdynamic systolic function, with an ejection fraction of >65%. The cavity size was normal. There is moderately increased left ventricular wall thickness. Left ventricular diastolic Doppler parameters are consistent with  impaired relaxation. 2. The right ventricle has normal systolic function. The cavity was normal. There is No increase in right ventricular wall thickness. 3. Left atrial size was normal. 4. Right atrial size was normal. 5. No evidence of mitral valve stenosis. 6. A 268ma Medtronic CoreValve-Evolut Pro bioprosthetic aortic valve (TAVR) valve is present in the aortic position. 7. Aortic valve regurgitation was not visualized by color flow Doppler. Moderate stenosis of the aortic valve. 8. Pulmonic valve regurgitation is not visualized by color flow Doppler. 9. The aorta is normal unless otherwise noted.  __________________   Echo 05/13/19 IMPRESSIONS  1. Left ventricular ejection fraction, by visual estimation, is 60 to 65%. The left ventricle has normal function. Normal left ventricular size. There is moderately increased left ventricular hypertrophy. Normal wall motion.  2. Left ventricular diastolic Doppler parameters are consistent with impaired relaxation  pattern of LV diastolic filling.  3. The tricuspid valve is normal  in structure. Tricuspid valve regurgitation is mild.  4. The mitral valve is normal in structure. Trace mitral valve regurgitation. No evidence of mitral stenosis.  5. Global right ventricle has normal systolic function.The right ventricular size is normal. No increase in right ventricular wall thickness.  6. Bioprosthetic aortic valve s/p TAVR. Trivial peri-valvular leak. Mean gradient 14 mmHg with AVA 1.6 cm^2.  7. Mildly dilated left atrium.  8. Right atrial size was normal.  9. The inferior vena cava is normal in size with greater than 50% respiratory variability, suggesting right atrial pressure of 3 mmHg. 10. The tricuspid regurgitant velocity is 2.65 m/s, and with an assumed right atrial pressure of 3 mmHg, the estimated right ventricular systolic pressure is mildly elevated at 31.1 mmHg. 11. Trivial pericardial effusion is present.  ASSESSMENT & PLAN:   Severe AS s/p TAVR: echo today shows EF 60-65%, normally functioning TAVR with mean gradient of 14 mm Hg and trivial PVL. She has NYHA class II symptoms. SBE prophylaxis discussed; she has clindamycin. Continue on aspirin and plavix. Plavix can be discontinued after 6 months of therapy (10/18/2019).  New LBBB:she wore a zio patch to rule out HAVB which has been mailed back to the company, but not read yet.  CAD: s/pPCI/DES to [facility] and PCI/DES to mLAD on 02/11/19. Contnue Aspirin and plavix.  HTN: BP well controlled today.   Chronic disatolic CHF: appears euvolemic. Continue lasix 26m daily  Pulmonary nodule: pre TAVR scans showed a 10 x 11 mm left lower lobe pulmonary nodule, new compared to remote prior study from 2009. Follow up PET-CT 05/10/19 was reassuring but will require follow up over time. I have sent the report to Dr. PDennard Schaumannfor review and to follow over time.  Tobacco abuse: chantix Rx'd last visit but it was prohibitively expensive. She is now smoking  but not inhaling.  Medication Adjustments/Labs and Tests Ordered: Current medicines are reviewed at length with the patient today.  Concerns regarding medicines are outlined above.  Medication changes, Labs and Tests ordered today are listed in the Patient Instructions below. Patient Instructions  Medication Instructions:  1) You [place] stop Plavix (Clopidogrel) on 10/18/2019  If you need a refill on your cardiac medications before your next appointment, please call your pharmacy.    Testing/Procedures: Your provider has requested that you have an echocardiogram in one year. Echocardiography is a painless test that uses sound waves to create images of your heart. It provides your doctor with information about the size and shape of your heart and how well your heart's chambers and valves are working. This procedure takes approximately one hour. There are no restrictions for this procedure.   Follow-Up: [facility] our [facility].  As part of our continuing mission to provide you with exceptional heart care, we have created designated Provider Care Teams.  These Care Teams include your primary Cardiologist (physician) and Advanced Practice Providers (APPs -  Physician Assistants and Nurse Practitioners) who all work together to provide you with the care you need, when you need it. You will need a follow up appointment in 3-4 months.  Please call our office 2 months in advance to schedule this appointment.  You [place] see CLauree Chandler MD or one of the following Advanced Practice Providers on your designated Care Team:   [place] PA-C DMelina Copa PA-C . MErmalinda Barrios PA-C  Any Other Special Instructions Will Be Listed Below (If Applicable).  Signed, Angelena Form, PA-C  05/14/2019 9:48 AM    [facility] [place], [place], [place]  [address] Phone: [phone]; Fax: [phone]

## 2019-05-13 ENCOUNTER — Encounter: Payer: Self-pay | Admitting: Physician Assistant

## 2019-05-13 ENCOUNTER — Ambulatory Visit (HOSPITAL_COMMUNITY): Payer: Medicare HMO | Attending: Cardiology

## 2019-05-13 ENCOUNTER — Other Ambulatory Visit: Payer: Self-pay

## 2019-05-13 ENCOUNTER — Ambulatory Visit (INDEPENDENT_AMBULATORY_CARE_PROVIDER_SITE_OTHER): Payer: Medicare HMO | Admitting: Physician Assistant

## 2019-05-13 VITALS — BP 122/68 | HR 61 | Ht 64.5 in | Wt 211.1 lb

## 2019-05-13 DIAGNOSIS — R911 Solitary pulmonary nodule: Secondary | ICD-10-CM

## 2019-05-13 DIAGNOSIS — Z952 Presence of prosthetic heart valve: Secondary | ICD-10-CM

## 2019-05-13 DIAGNOSIS — I2510 Atherosclerotic heart disease of native coronary artery without angina pectoris: Secondary | ICD-10-CM

## 2019-05-13 DIAGNOSIS — I447 Left bundle-branch block, unspecified: Secondary | ICD-10-CM

## 2019-05-13 DIAGNOSIS — I5032 Chronic diastolic (congestive) heart failure: Secondary | ICD-10-CM

## 2019-05-13 DIAGNOSIS — Z720 Tobacco use: Secondary | ICD-10-CM

## 2019-05-13 DIAGNOSIS — I10 Essential (primary) hypertension: Secondary | ICD-10-CM

## 2019-05-13 NOTE — Patient Instructions (Signed)
Medication Instructions:  1) You Tew[place] stop Plavix (Clopidogrel) on 10/18/2019  If you need a refill on your cardiac medications before your next appointment, please call your pharmacy.    Testing/Procedures: Your provider has requested that you have an echocardiogram in one year. Echocardiography is a painless test that uses sound waves to create images of your heart. It provides your doctor with information about the size and shape of your heart and how well your heart's chambers and valves are working. This procedure takes approximately one hour. There are no restrictions for this procedure.   Follow-Up: At [facility], you and your health needs are our priority.  As part of our continuing mission to provide you with exceptional heart care, we have created designated Provider Care Teams.  These Care Teams include your primary Cardiologist (physician) and Advanced Practice Providers (APPs -  Physician Assistants and Nurse Practitioners) who all work together to provide you with the care you need, when you need it. You will need a follow up appointment in 3-4 months.  Please call our office 2 months in advance to schedule this appointment.  You [place] see [name], MD or one of the following Advanced Practice Providers on your designated Care Team:   [place], PA-C Melina Copa, PA-C . Ermalinda Barrios, PA-C  Any Other Special Instructions Will Be Listed Below (If Applicable).

## 2019-05-17 ENCOUNTER — Telehealth (HOSPITAL_COMMUNITY): Payer: Self-pay | Admitting: *Deleted

## 2019-05-17 NOTE — Telephone Encounter (Signed)
-----   Message from [name], MD sent at 05/17/2019  3:57 PM EDT ----- I just got her Zio cardiac monitor back this afternoon and look at it. She has no heart block. OK to exercise. Thanks, chris ----- Message ----- From: Rowe Pavy, RN Sent: 05/17/2019   2:03 PM EDT To: [name], MD  Thank you  What about her Zio patch results - should we wait on the results?? ( I am not familiar with the turn around time once it is mailed back) ----- Message ----- From: [name], MD Sent: 05/17/2019   1:42 PM EDT To: Rowe Pavy, RN  Thanks. I think Ms. Hole can come in for rehab as this will be very important for her recovery despite her Covid 19 risk. Gerald Stabs ----- Message ----- From: Rowe Pavy, RN Sent: 05/17/2019   1:40 PM EDT To: [name], MD  So sorry Dr. Angelena Form  Totally my fault. I have added her to the message.  Juggling way to many balls today:) ----- Message ----- From: [name], MD Sent: 05/17/2019   1:25 PM EDT To: Rowe Pavy, RN  Hi. I am totally missing the patient's name here. Who are you referencing? I am probably looking in the wrong place. Gerald Stabs ----- Message ----- From: Rowe Pavy, RN Sent: 05/17/2019  12:55 PM EDT To: [name], MD  [facility],  We are seeing patients on-site for cardiac rehab . A great deal of planning with advisement from our Medical Director - Dr. Radford Pax, CV Service line leadership, Infection Disease Control, Facilities, security, recommendations from American Association of Cardiac and Pulmonary Rehab (AACVPR) with the goal for optimal patient safety. Patients will have strict guidelines and criteria they must adhere to and follow. Patients will wear a mask during exercise and practice social distancing. Patients will have to complete screening prior to entry into gym area.   Your patient expressed great interest in  participating in facility cardiac rehab. Patient has a Higfh COVID-19 risk score of 8. Do you feel this patient is appropriate to resume exercise in facility cardiac rehab? Any additional restrictions you feel are appropriate for this patient?   Seen in follow up last on 10/8 by KT.  Awaiting results from the [employer] for transient CHB ( mailed back to the company last week)   Thank you and we appreciate your input  Cherre Huger, BSN Cardiac and Pulmonary Rehab Nurse Navigator   Cardiac Rehab Staff

## 2019-05-19 ENCOUNTER — Telehealth (HOSPITAL_COMMUNITY): Payer: Self-pay

## 2019-05-19 NOTE — Telephone Encounter (Signed)
Called and spoke with pt in regards to CR, pt stated she is not able to participate at this time. Stated she has a lot of other things she need to catch up on.  Closed referral

## 2019-05-20 ENCOUNTER — Other Ambulatory Visit: Payer: Self-pay | Admitting: Cardiovascular Disease

## 2019-05-20 ENCOUNTER — Other Ambulatory Visit: Payer: Self-pay | Admitting: Family Medicine

## 2019-05-20 DIAGNOSIS — I160 Hypertensive urgency: Secondary | ICD-10-CM

## 2019-05-27 ENCOUNTER — Other Ambulatory Visit: Payer: Self-pay | Admitting: Family Medicine

## 2019-05-27 NOTE — Telephone Encounter (Signed)
Ok to refill??  Last office visit 01/14/2019.  Last refill 03/23/2019, #1 refill.

## 2019-06-02 ENCOUNTER — Encounter: Payer: Self-pay | Admitting: Thoracic Surgery (Cardiothoracic Vascular Surgery)

## 2019-06-08 ENCOUNTER — Other Ambulatory Visit: Payer: Self-pay | Admitting: Family Medicine

## 2019-06-11 ENCOUNTER — Other Ambulatory Visit: Payer: Self-pay | Admitting: Family Medicine

## 2019-06-18 ENCOUNTER — Inpatient Hospital Stay: Payer: Medicare HMO | Attending: Oncology | Admitting: Oncology

## 2019-06-18 ENCOUNTER — Inpatient Hospital Stay: Payer: Medicare HMO

## 2019-06-21 ENCOUNTER — Other Ambulatory Visit: Payer: Self-pay | Admitting: Family Medicine

## 2019-07-06 ENCOUNTER — Telehealth: Payer: Self-pay

## 2019-07-06 DIAGNOSIS — K7581 Nonalcoholic steatohepatitis (NASH): Secondary | ICD-10-CM

## 2019-07-06 DIAGNOSIS — K7460 Unspecified cirrhosis of liver: Secondary | ICD-10-CM

## 2019-07-06 NOTE — Telephone Encounter (Signed)
-----   Message from Marlon Pel, RN sent at 02/08/2019 11:50 AM EDT ----- ROV with me in December (cbc, cmet, AFP, inr and RUQ the week prior).  - Dr. [name] .  Please call patient and set up labs, [place], and OV

## 2019-07-06 NOTE — Telephone Encounter (Signed)
You have been scheduled for an abdominal ultrasound at [facility] (1st floor of hospital) on 08/09/19 at 930 am. Please arrive 15 minutes prior to your appointment for registration. Make certain not to have anything to eat or drink 6 hours prior to your appointment. Should you need to reschedule your appointment, please contact radiology at [phone]. This test typically takes about 30 minutes to perform.   The pt has been advised and will come in for labs prior to the [place].

## 2019-07-07 ENCOUNTER — Telehealth: Payer: Self-pay | Admitting: Family Medicine

## 2019-07-07 NOTE — Telephone Encounter (Signed)
Pt calling to get a refill on her yeast infection medication if possible? She states that her sugars have been out of whack and that it the culprit for the yeast. She also states that the blue pill worked better then the 1st one we sent which was pink color? (I am not sure what she is referring to as diflucan is pink, didn't know if you knew or not)

## 2019-07-08 ENCOUNTER — Other Ambulatory Visit: Payer: Self-pay | Admitting: Family Medicine

## 2019-07-08 MED ORDER — FLUCONAZOLE 150 MG PO TABS: 150.0000 mg | ORAL_TABLET | Freq: Once | ORAL | 1 refills | Status: DC

## 2019-07-08 NOTE — Telephone Encounter (Signed)
Pt aware.

## 2019-07-08 NOTE — Telephone Encounter (Signed)
Not sure what she is referring to.  I refilled diflucan.

## 2019-07-08 NOTE — Telephone Encounter (Signed)
Tried to call no answer and no vm

## 2019-07-26 ENCOUNTER — Other Ambulatory Visit: Payer: Self-pay | Admitting: Family Medicine

## 2019-07-26 MED ORDER — ZOLPIDEM TARTRATE 5 MG PO TABS: ORAL_TABLET | ORAL | 5 refills | Status: DC

## 2019-07-26 NOTE — Telephone Encounter (Signed)
Requesting refill    Ambien  LOV: 01/14/19  LRF:  05/27/19

## 2019-08-09 ENCOUNTER — Ambulatory Visit (HOSPITAL_COMMUNITY): Payer: Medicare HMO

## 2019-08-13 NOTE — Telephone Encounter (Signed)
The pt states she has been sick with a cold or something she is not sure if it is COVID or not.  She cancelled [place] and appt until she feels better.  She will call back when she is better.

## 2019-08-13 NOTE — Telephone Encounter (Signed)
Pt has cx abd [place] and inquired whether OV scheduled 08/17/19 is necessary.

## 2019-08-17 ENCOUNTER — Ambulatory Visit: Payer: Medicare HMO | Admitting: Gastroenterology

## 2019-08-23 ENCOUNTER — Telehealth: Payer: Self-pay | Admitting: Physician Assistant

## 2019-08-23 NOTE — Telephone Encounter (Signed)
Informed the patient that if the doctor doing the procedure requires cardiac clearance or would like to hold a medication, a clearance will need to be faxed to the office. She was grateful for call.

## 2019-08-23 NOTE — Telephone Encounter (Signed)
Patient had a valve replacement in September. She needed to know if she had to make any special arrangements to have outpatient procedures done as a result. She has a perforated ear drum, and will have to make an appointment to get it cleaned. She just wanted to know if this will require special clearance to have cleaned. Please let her know what the office decides

## 2019-08-24 ENCOUNTER — Other Ambulatory Visit: Payer: Self-pay | Admitting: Family Medicine

## 2019-08-26 DIAGNOSIS — H6123 Impacted cerumen, bilateral: Secondary | ICD-10-CM | POA: Insufficient documentation

## 2019-08-26 DIAGNOSIS — F17200 Nicotine dependence, unspecified, uncomplicated: Secondary | ICD-10-CM | POA: Diagnosis not present

## 2019-08-26 DIAGNOSIS — J343 Hypertrophy of nasal turbinates: Secondary | ICD-10-CM | POA: Diagnosis not present

## 2019-08-26 DIAGNOSIS — H938X2 Other specified disorders of left ear: Secondary | ICD-10-CM | POA: Diagnosis not present

## 2019-08-26 DIAGNOSIS — H6121 Impacted cerumen, right ear: Secondary | ICD-10-CM | POA: Diagnosis not present

## 2019-08-26 DIAGNOSIS — H9011 Conductive hearing loss, unilateral, right ear, with unrestricted hearing on the contralateral side: Secondary | ICD-10-CM | POA: Insufficient documentation

## 2019-09-03 ENCOUNTER — Other Ambulatory Visit: Payer: Self-pay | Admitting: Family Medicine

## 2019-09-03 IMAGING — US ULTRASOUND ABDOMEN LIMITED
1 series · 14 of 25 positions shown · non-contrast
Comparison: Ultrasound July 22, 2018.

CLINICAL DATA: Hepatic cirrhosis.

EXAM:
ULTRASOUND ABDOMEN LIMITED RIGHT UPPER QUADRANT

[Series 1: ultrasound abdomen limited · 14 of 46 slices shown]
[im 1/46]
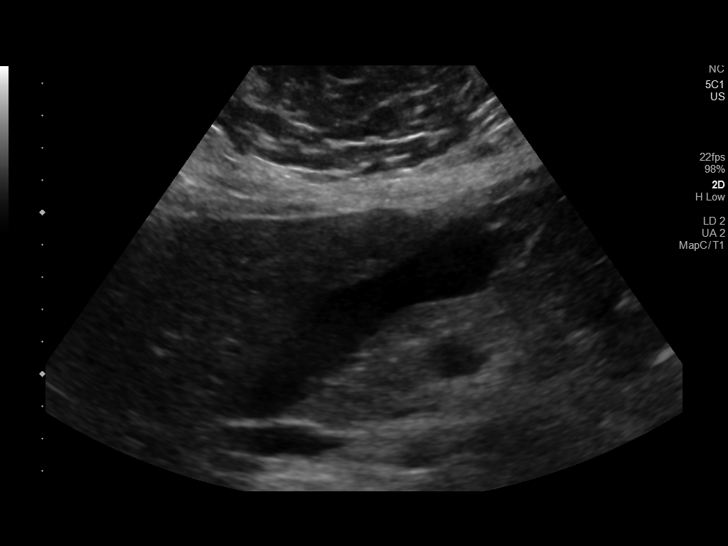
[im 4/46]
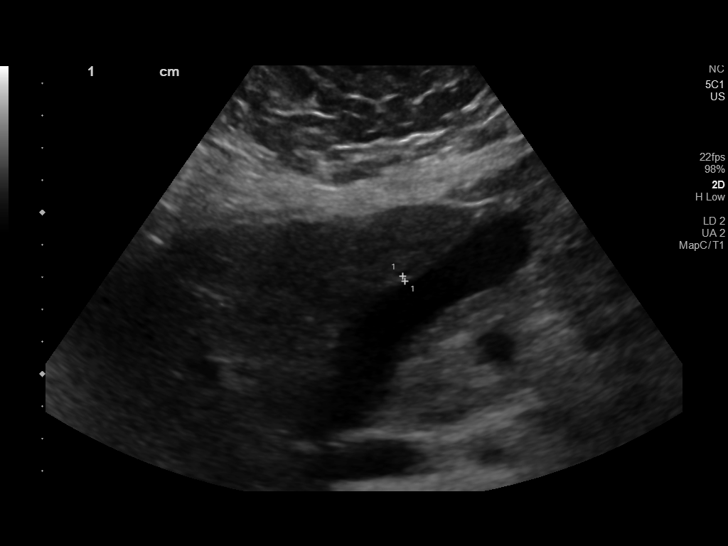
[im 8/46]
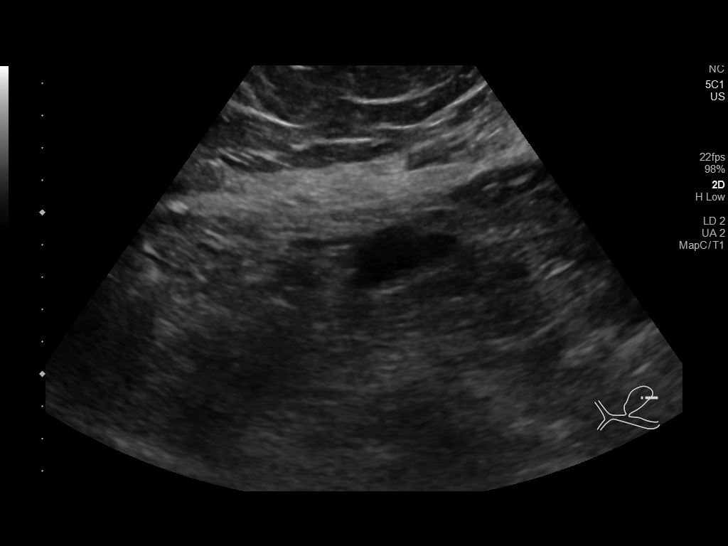
[im 12/46]
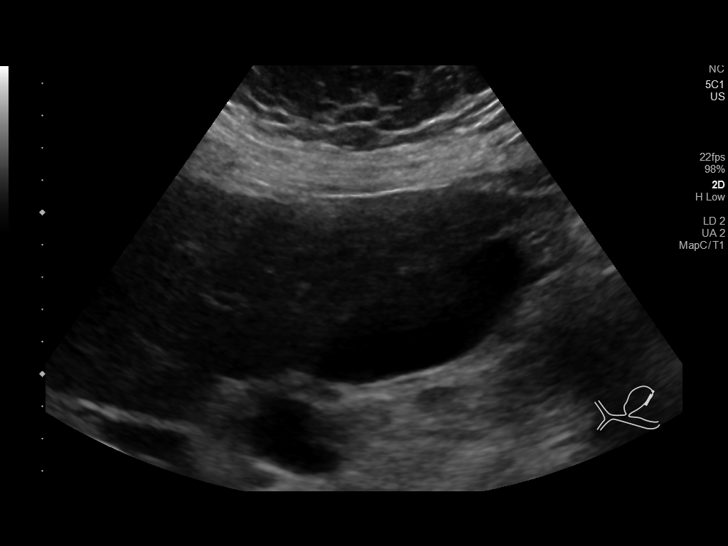
[im 16/46]
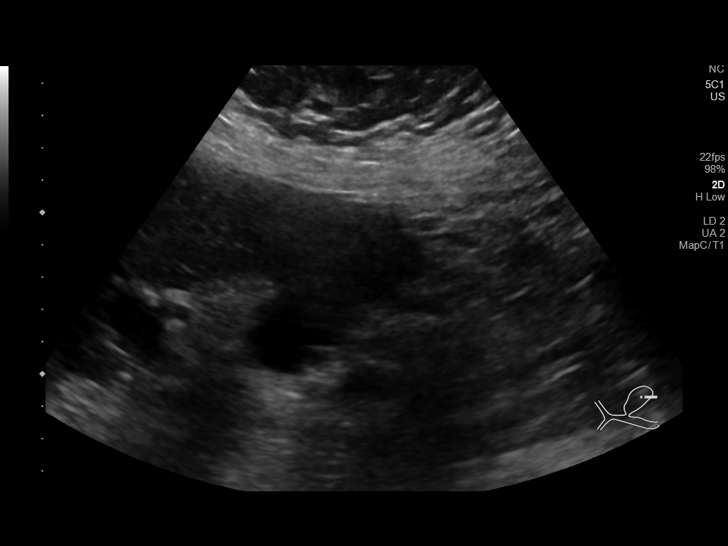
[im 17/46]
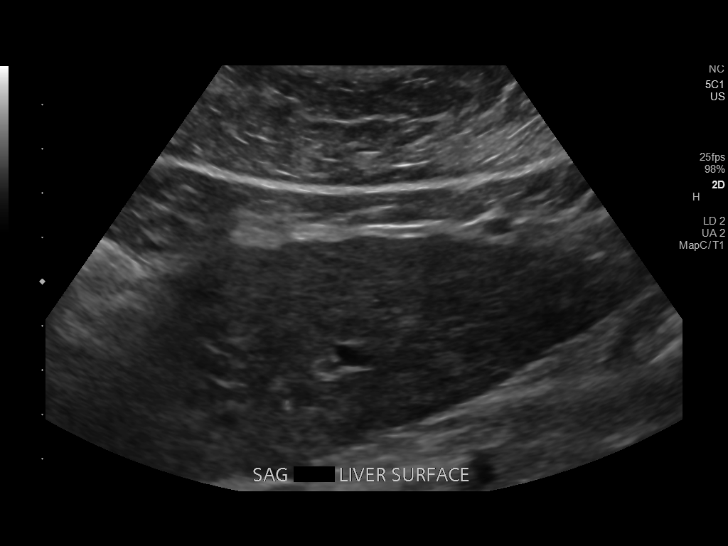
[im 21/46]
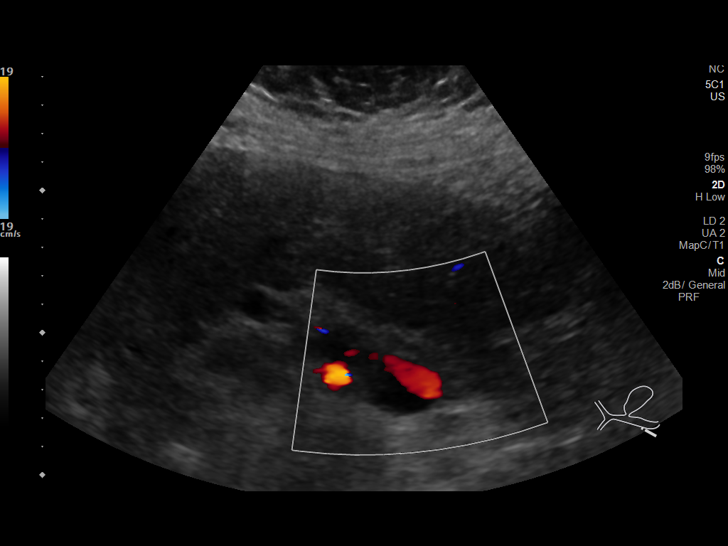
[im 25/46]
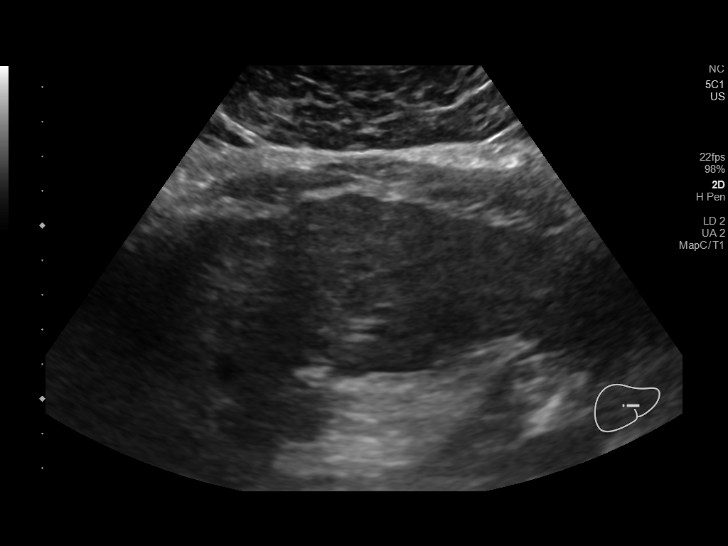
[im 29/46]
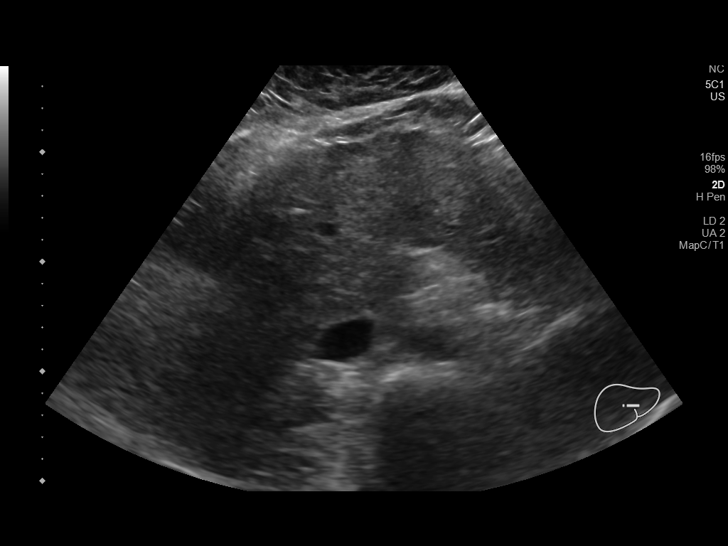
[im 31/46]
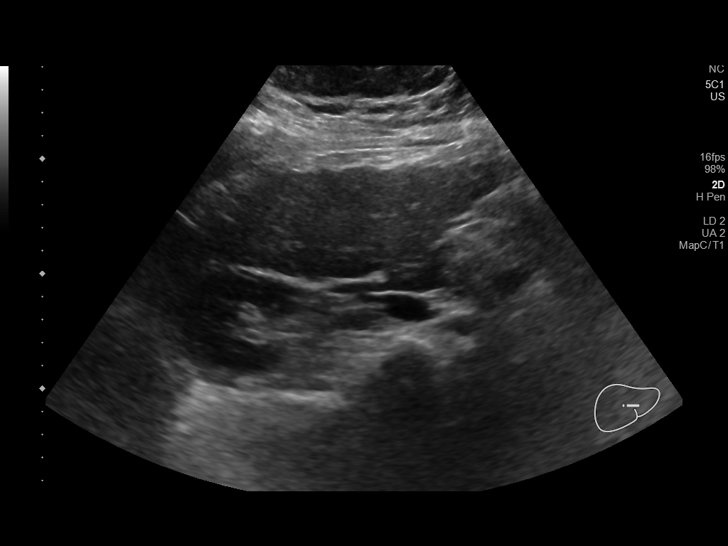
[im 34/46]
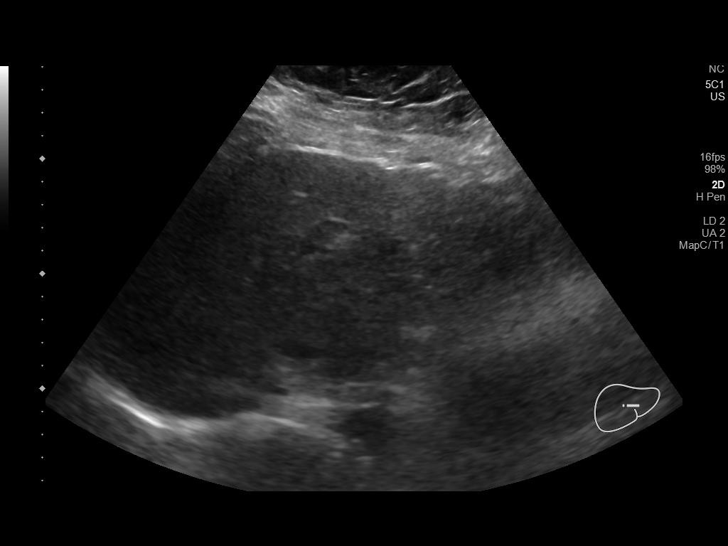
[im 38/46]
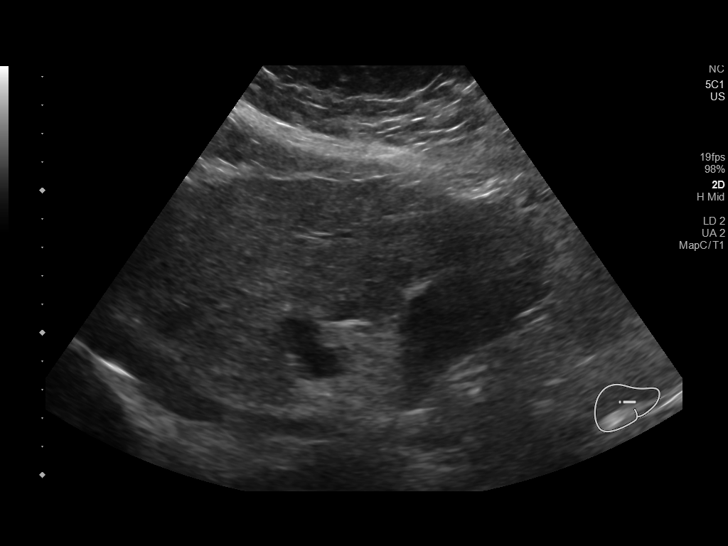
[im 42/46]
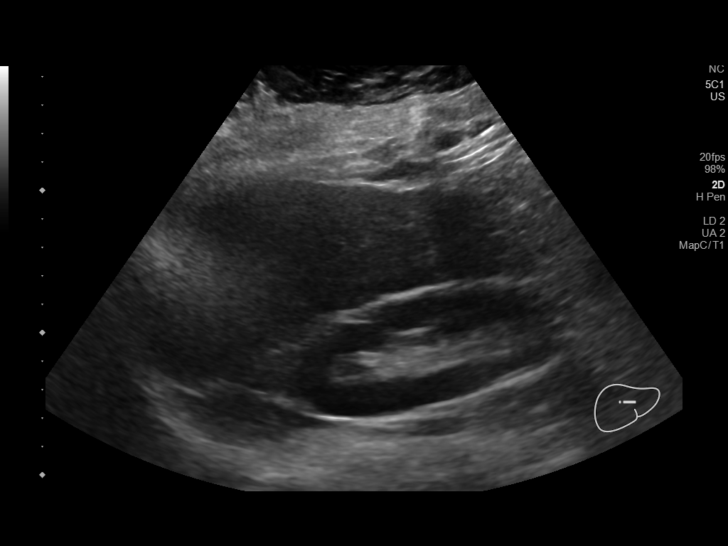
[im 46/46]
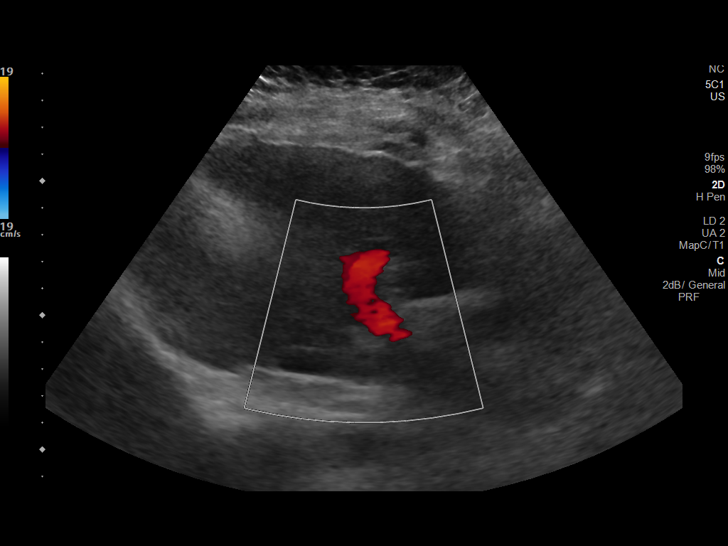

[14 of 25 positions shown; findings below may reference images not displayed]

FINDINGS: Gallbladder:

No gallstones or wall thickening visualized. No sonographic Murphy
sign noted by sonographer.

Common bile duct:

Diameter: 3 mm which is within normal limits.

Liver:

No focal lesion identified. Heterogeneous echotexture of hepatic
parenchyma is noted with nodular contours consistent with hepatic
cirrhosis. Portal vein is patent on color Doppler imaging with
normal direction of blood flow towards the liver.
IMPRESSION: Findings consistent with hepatic cirrhosis. No focal sonographic
abnormality is noted in hepatic parenchyma.

## 2019-09-09 ENCOUNTER — Telehealth: Payer: Self-pay | Admitting: Family Medicine

## 2019-09-09 NOTE — Telephone Encounter (Signed)
Pt called and states that when she was in hospital she had stopped her metformin and then restarted it but has been out for the last week or so and wanted to know if you wanted her to continue to take it or not?

## 2019-09-09 NOTE — Telephone Encounter (Signed)
Yes, and I would like to see her in ov for hosp follow up and labs.

## 2019-09-10 MED ORDER — METFORMIN HCL ER 500 MG PO TB24: 1000.0000 mg | ORAL_TABLET | Freq: Two times a day (BID) | ORAL | 1 refills | Status: DC

## 2019-09-10 NOTE — Telephone Encounter (Signed)
Pt aware and med sent to pharm. Pt will call and get apt asap.

## 2019-09-24 ENCOUNTER — Other Ambulatory Visit: Payer: Self-pay | Admitting: Family Medicine

## 2019-10-08 ENCOUNTER — Telehealth: Payer: Self-pay | Admitting: Family Medicine

## 2019-10-08 NOTE — Telephone Encounter (Signed)
Pt is having trouble sleeping at night on the 5 mg ambien and she was wondering if you would increase it to the 10 mgs to help her sleep better?

## 2019-10-11 ENCOUNTER — Other Ambulatory Visit: Payer: Self-pay | Admitting: Family Medicine

## 2019-10-11 NOTE — Telephone Encounter (Signed)
I would like to see her, she is due for labs for diabetes

## 2019-10-11 NOTE — Telephone Encounter (Signed)
Spoke with patient and informed her that Dr. [name] would like to see her. Patient verbalized understanding. Appointment scheduled for tomorrow

## 2019-10-12 ENCOUNTER — Other Ambulatory Visit: Payer: Self-pay

## 2019-10-12 ENCOUNTER — Ambulatory Visit (INDEPENDENT_AMBULATORY_CARE_PROVIDER_SITE_OTHER): Payer: Medicare HMO | Admitting: Family Medicine

## 2019-10-12 VITALS — BP 118/82 | HR 68 | Temp 97.9°F | Resp 18 | Ht 64.5 in | Wt 203.0 lb

## 2019-10-12 DIAGNOSIS — I2510 Atherosclerotic heart disease of native coronary artery without angina pectoris: Secondary | ICD-10-CM | POA: Diagnosis not present

## 2019-10-12 DIAGNOSIS — M549 Dorsalgia, unspecified: Secondary | ICD-10-CM

## 2019-10-12 DIAGNOSIS — K922 Gastrointestinal hemorrhage, unspecified: Secondary | ICD-10-CM | POA: Diagnosis not present

## 2019-10-12 DIAGNOSIS — Z952 Presence of prosthetic heart valve: Secondary | ICD-10-CM

## 2019-10-12 DIAGNOSIS — F5101 Primary insomnia: Secondary | ICD-10-CM

## 2019-10-12 DIAGNOSIS — R7309 Other abnormal glucose: Secondary | ICD-10-CM | POA: Diagnosis not present

## 2019-10-12 MED ORDER — ZOLPIDEM TARTRATE 10 MG PO TABS: 10.0000 mg | ORAL_TABLET | Freq: Every evening | ORAL | 1 refills | Status: DC | PRN

## 2019-10-12 NOTE — Progress Notes (Signed)
Subjective:    Patient ID: Kelly [name], female    DOB: [birth year], 69 y.o.   MRN: [id number]  HPI  I have not seen the patient since last year.  Since I last saw the patient, she underwent coronary artery stenting for coronary artery disease.  She also had aortic valve replacement via transcatheter approach due to her severe aortic stenosis.  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  I do not see that the patient has had a CBC to monitor her hemoglobin since September.  She also has a history of borderline diabetes mellitus type 2 and she has not had a hemoglobin A1c in quite some time.  She is also overdue to check her renal function, liver function test, and cholesterol.  She recently called asking to increase her Ambien from 5 mg a day to 10 mg a day due to trouble sleeping.  Her husband recently had surgery complicated by pulmonary embolisms.  As result, she is under increased stress and she is unable to sleep.  Despite taking 5 mg of Ambien, she toss and turn in bed sometimes 2:58 in the morning before she is able to go to sleep.  Of note, the patient has a history of confusion due to mild dementia coupled with benzodiazepine use in the past.  Therefore I was very concerned about increasing her sleeping pill dose without discussing this with her first.  Today we discussed at length the risk of sedatives.  I explained that Ambien can potentially cause confusion, falls, and memory loss.  I want her to use this medication sparingly.  Patient also states that 1 week ago, she twisted and felt a sudden sharp pain in the center of her back roughly at the level of T8.  She now reports pain radiating around both ribs right greater than left to the center of her chest in the front and her abdomen.  The pain is sharp and intense at times and is made worse by movement and twisting and bending over.  She denies any numbness or weakness in her legs. Past Medical History:  Diagnosis  Date  . Anxiety   . Chronic upper GI bleeding    intestinal AVM's, portal gastropathy, esophageal varices on egd 04/2018  . Coronary artery disease involving native coronary artery without angina pectoris   . Dementia ([place]) 09/16/2016  . Depression   . DIVERTICULITIS OF COLON 06/14/2008   Qualifier: Diagnosis of  By: [name]    . DM (diabetes mellitus), type 2 ([place]) 09/15/2016  . Esophageal varices determined by endoscopy ([place])   . Hyperlipidemia   . Hypertension   . Hypothyroidism   . Incidental pulmonary nodule, greater than or equal to 63m 10/14/2018   LLL nodule needs f/u CT or PET-CT scan  . Irritable bowel syndrome 04/06/2008   Centricity Description: IBS Qualifier: Diagnosis of  By: [name] (ADeborra Medina, [name]   Centricity Description: IRRITABLE BOWEL SYNDROME Qualifier: Diagnosis of  By: [name], [name]  . Liver cirrhosis secondary to NASH [place] 09/16/2016  . NASH (nonalcoholic steatohepatitis)   . Obesity   . Portal hypertensive gastropathy ([place]   . 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 04/20/19: s/p TAVR wtih a 239mMedtronic CoreValve Evolut Pro via the TF approach  . Severe aortic stenosis   . Splenomegaly 09/16/2016  . Thrombocytopenia ([place]  . Thyroid disease   . Tobacco abuse   .  Vitamin D deficiency    Past Surgical History:  Procedure Laterality Date  . CARDIAC CATHETERIZATION    . COLONOSCOPY WITH PROPOFOL N/A 04/09/2018   Procedure: COLONOSCOPY WITH PROPOFOL;  Surgeon: [name], MD;  Location: [facility];  Service: Endoscopy;  Laterality: N/A;  . CORONARY ANGIOPLASTY    . CORONARY STENT INTERVENTION N/A 02/11/2019   Procedure: CORONARY STENT INTERVENTION;  Surgeon: [name], MD;  Location: [facility];  Service: Cardiovascular;  Laterality: N/A;  Prox RCA Prox LAD  . ESOPHAGOGASTRODUODENOSCOPY (EGD) WITH PROPOFOL N/A 04/09/2018   Procedure: ESOPHAGOGASTRODUODENOSCOPY (EGD) WITH PROPOFOL;  Surgeon:  [name], MD;  Location: [facility];  Service: Endoscopy;  Laterality: N/A;  . oopherectomy    . OVARIAN CYST REMOVAL    . POLYPECTOMY  04/09/2018   Procedure: POLYPECTOMY;  Surgeon: [name], MD;  Location: [facility];  Service: Endoscopy;;  . RIGHT/LEFT HEART CATH AND CORONARY ANGIOGRAPHY N/A 10/07/2018   Procedure: RIGHT/LEFT HEART CATH AND CORONARY ANGIOGRAPHY;  Surgeon: [name], MD;  Location: [place] [facility];  Service: Cardiovascular;  Laterality: N/A;  . TEE WITHOUT CARDIOVERSION N/A 04/20/2019   Procedure: TRANSESOPHAGEAL ECHOCARDIOGRAM (TEE);  Surgeon: [name], MD;  Location: [facility];  Service: Open Heart Surgery;  Laterality: N/A;  . TRANSCATHETER AORTIC VALVE REPLACEMENT, TRANSFEMORAL N/A 04/20/2019   Procedure: TRANSCATHETER AORTIC VALVE REPLACEMENT, TRANSFEMORAL approach using 43m Evolut PRO+ medtronic valve;  Surgeon: MBurnell Blanks MD;  [place] [facility]  Service: Open Heart Surgery;  Laterality: N/A;   Current Outpatient Medications on File Prior to Visit  Medication Sig Dispense Refill  . ACCU-CHEK AVIVA PLUS test strip     . ACCU-CHEK SOFTCLIX LANCETS lancets     . amLODipine (NORVASC) 10 MG tablet TAKE 1 TABLET BY MOUTH EVERY DAY 90 tablet 3  . aspirin EC 81 MG tablet Take 1 tablet (81 mg total) by mouth daily. 90 tablet 3  . atorvastatin (LIPITOR) 20 MG tablet TAKE 1 TABLET EVERY DAY 90 tablet 3  . Blood Glucose Monitoring Suppl (ACCU-CHEK AVIVA PLUS) w/Device KIT     . clindamycin (CLEOCIN) 300 MG capsule Take 2 capsules (600 mg) 1 hour prior to all dental visits. 4 capsule 8  . clopidogrel (PLAVIX) 75 MG tablet Take 1 tablet (75 mg total) by mouth daily. 90 tablet 3  . FEROSUL 325 (65 Fe) MG tablet TAKE 1 TABLET (325 MG TOTAL) BY MOUTH DAILY. 90 tablet 3  . fluconazole (DIFLUCAN) 150 MG tablet TAKE 1 TABLET BY MOUTH AS ONE DOSE 1 tablet 1  . furosemide (LASIX) 20 MG tablet TAKE 1 TABLET EVERY DAY 90 tablet 1  .  levothyroxine (SYNTHROID) 75 MCG tablet TAKE 1 TABLET EVERY DAY 90 tablet 3  . losartan (COZAAR) 100 MG tablet TAKE 1 TABLET EVERY DAY 90 tablet 1  . metFORMIN (GLUCOPHAGE-XR) 500 MG 24 hr tablet Take 2 tablets (1,000 mg total) by mouth 2 (two) times daily. 360 tablet 1  . pantoprazole (PROTONIX) 40 MG tablet TAKE 1 TABLET EVERY DAY 90 tablet 3  . potassium chloride (KLOR-CON) 10 MEQ tablet TAKE 2 TABLETS BY MOUTH DAILY 180 tablet 1   Current Facility-Administered Medications on File Prior to Visit  Medication Dose Route Frequency Provider Last Rate Last Admin  . cyanocobalamin ((VITAMIN B-12)) injection 1,000 mcg  1,000 mcg Subcutaneous Q30 days PSusy Frizzle MD   1,000 mcg at 09/08/17 1408   Allergies  Allergen Reactions  . Prednisone  Itching    Heart rate increased  . Celecoxib Rash  . Macrodantin [Nitrofurantoin Macrocrystal] Rash  . Penicillins Hives    Did it involve swelling of the face/tongue/throat, SOB, or low BP? No Did it involve sudden or severe rash/hives, skin peeling, or any reaction on the inside of your mouth or nose? No Did you need to seek medical attention at a hospital or doctor's office? No When did it last happen?20 + years If all above answers are "NO", [place] proceed with cephalosporin use.    Social History   Socioeconomic History  . Marital status: Married    Spouse name: Not on file  . Number of children: 1  . Years of education: Not on file  . Highest education level: Not on file  Occupational History  . Not on file  Tobacco Use  . Smoking status: Current Every Day Smoker    Packs/day: 0.50    Types: Cigarettes  . Smokeless tobacco: Never Used  Substance and Sexual Activity  . Alcohol use: No  . Drug use: Not Currently    Types: Benzodiazepines  . Sexual activity: Not Currently  Other Topics Concern  . Not on file  Social History Narrative  . Not on file   Social Determinants of Health   Financial Resource Strain:   .  Difficulty of Paying Living Expenses: Not on file  Food Insecurity:   . Worried About Charity fundraiser in the Last Year: Not on file  . Ran Out of Food in the Last Year: Not on file  Transportation Needs:   . Lack of Transportation (Medical): Not on file  . Lack of Transportation (Non-Medical): Not on file  Physical Activity:   . Days of Exercise per Week: Not on file  . Minutes of Exercise per Session: Not on file  Stress:   . Feeling of Stress : Not on file  Social Connections:   . Frequency of Communication with Friends and Family: Not on file  . Frequency of Social Gatherings with Friends and Family: Not on file  . Attends Religious Services: Not on file  . Active Member of Clubs or Organizations: Not on file  . Attends Archivist Meetings: Not on file  . Marital Status: Not on file  Intimate Partner Violence:   . Fear of Current or Ex-Partner: Not on file  . Emotionally Abused: Not on file  . Physically Abused: Not on file  . Sexually Abused: Not on file       Review of Systems  All other systems reviewed and are negative.      Objective:   Physical Exam Vitals reviewed.  Constitutional:      General: She is not in acute distress.    Appearance: She is obese. She is not ill-appearing, toxic-appearing or diaphoretic.  Cardiovascular:     Rate and Rhythm: Normal rate and regular rhythm.     Heart sounds: Murmur present.  Pulmonary:     Effort: Pulmonary effort is normal. No respiratory distress.     Breath sounds: Normal breath sounds. No wheezing, rhonchi or rales.  Abdominal:     General: Bowel sounds are normal. There is no distension.     Palpations: Abdomen is soft.     Tenderness: There is no abdominal tenderness. There is no guarding.  Musculoskeletal:     Thoracic back: Tenderness present. No swelling or spasms. Decreased range of motion.       Back:  Right lower leg: No edema.     Left lower leg: No edema.  Neurological:     Mental  Status: She is alert.           Assessment & Plan:  Mid-back pain, acute - Plan: DG Thoracic Spine W/Swimmers  Coronary artery disease involving native coronary artery of native heart without angina pectoris - Plan: CBC with Differential/Platelet, COMPLETE METABOLIC PANEL WITH GFR, Hemoglobin A1c, Lipid panel  Chronic upper GI bleeding  Primary insomnia  S/P TAVR (transcatheter aortic valve replacement)  Regarding her insomnia, I cautioned the patient that increasing the dose of Ambien increases her risk of falls, confusion, and memory loss.  I will give the patient 10 mg tablets.  I cautioned her to use them sparingly.  I would like her to break them in half most nights and only use 10 mg if absolutely necessary.  Also recommended that she ask her family to monitor for any worsening confusion or disorientation on the medication and if so discontinue it immediately.  I am concerned that the patient Vandervoort have herniated the disc in her thoracic spine.  Begin by obtaining an x-ray of the thoracic spine to rule out a vertebral fracture.  Await the results of this before determining her next course of action.  Patient is overdue to check a fasting lipid panel.  Given her history of coronary artery disease I would recommend an LDL cholesterol less than 70.  Also check a hemoglobin A1c to monitor her borderline diabetes.  Given her history of chronic upper GI bleeding, I would like to check a CBC to ensure that the patient is not losing blood given her antiplatelet agents.  Otherwise she seems to be doing extremely well after valve replacement and denies any shortness of breath or fatigue or dyspnea on exertion.

## 2019-10-13 LAB — COMPLETE METABOLIC PANEL WITH GFR
AG Ratio: 1.3 (calc) (ref 1.0–2.5)
ALT: 16 U/L (ref 6–29)
AST: 18 U/L (ref 10–35)
Albumin: 4 g/dL (ref 3.6–5.1)
Alkaline phosphatase (APISO): 146 U/L (ref 37–153)
BUN/Creatinine Ratio: 13 (calc) (ref 6–22)
BUN: 12 mg/dL (ref 7–25)
CO2: 26 mmol/L (ref 20–32)
Calcium: 9.7 mg/dL (ref 8.6–10.4)
Chloride: 104 mmol/L (ref 98–110)
Creat: 0.94 mg/dL — ABNORMAL HIGH (ref 0.60–0.93)
GFR, Est African American: 71 mL/min/{1.73_m2} (ref >=60)
GFR, Est Non African American: 61 mL/min/{1.73_m2} (ref >=60)
Globulin: 3 g/dL (calc) (ref 1.9–3.7)
Glucose, Bld: 328 mg/dL — ABNORMAL HIGH (ref 65–99)
Potassium: 3.9 mmol/L (ref 3.5–5.3)
Sodium: 139 mmol/L (ref 135–146)
Total Bilirubin: 0.7 mg/dL (ref 0.2–1.2)
Total Protein: 7 g/dL (ref 6.1–8.1)

## 2019-10-13 LAB — LIPID PANEL
Cholesterol: 147 mg/dL (ref <200)
HDL: 50 mg/dL (ref >=50)
LDL Cholesterol (Calc): 74 mg/dL (calc)
Non-HDL Cholesterol (Calc): 97 mg/dL (calc) (ref <130)
Total CHOL/HDL Ratio: 2.9 (calc) (ref <5.0)
Triglycerides: 149 mg/dL (ref <150)

## 2019-10-13 LAB — CBC WITH DIFFERENTIAL/PLATELET
Absolute Monocytes: 623 cells/uL (ref 200–950)
Basophils Absolute: 60 cells/uL (ref 0–200)
Basophils Relative: 0.8 %
Eosinophils Absolute: 233 cells/uL (ref 15–500)
Eosinophils Relative: 3.1 %
HCT: 40.9 % (ref 35.0–45.0)
Hemoglobin: 13.3 g/dL (ref 11.7–15.5)
Lymphs Abs: 1613 cells/uL (ref 850–3900)
MCH: 27.5 pg (ref 27.0–33.0)
MCHC: 32.5 g/dL (ref 32.0–36.0)
MCV: 84.7 fL (ref 80.0–100.0)
MPV: 12.7 fL — ABNORMAL HIGH (ref 7.5–12.5)
Monocytes Relative: 8.3 %
Neutro Abs: 4973 cells/uL (ref 1500–7800)
Neutrophils Relative %: 66.3 %
Platelets: 103 10*3/uL — ABNORMAL LOW (ref 140–400)
RBC: 4.83 10*6/uL (ref 3.80–5.10)
RDW: 14 % (ref 11.0–15.0)
Total Lymphocyte: 21.5 %
WBC: 7.5 10*3/uL (ref 3.8–10.8)

## 2019-10-13 LAB — HEMOGLOBIN A1C
Hgb A1c MFr Bld: 10.2 % of total Hgb — ABNORMAL HIGH (ref <5.7)
Mean Plasma Glucose: 246 (calc)
eAG (mmol/L): 13.6 (calc)

## 2019-10-15 ENCOUNTER — Other Ambulatory Visit: Payer: Self-pay | Admitting: *Deleted

## 2019-10-15 ENCOUNTER — Telehealth: Payer: Self-pay | Admitting: Family Medicine

## 2019-10-15 DIAGNOSIS — E1165 Type 2 diabetes mellitus with hyperglycemia: Secondary | ICD-10-CM

## 2019-10-15 MED ORDER — GLIPIZIDE ER 10 MG PO TB24: 10.0000 mg | ORAL_TABLET | Freq: Every day | ORAL | 0 refills | Status: DC

## 2019-10-15 MED ORDER — EMPAGLIFLOZIN 25 MG PO TABS: 25.0000 mg | ORAL_TABLET | Freq: Every day | ORAL | 0 refills | Status: DC

## 2019-10-15 NOTE — Telephone Encounter (Signed)
Patient says that Vania Rea is too expensive would like to know if something else can be called in for her  [phone]

## 2019-10-16 ENCOUNTER — Ambulatory Visit: Payer: Self-pay

## 2019-10-18 NOTE — Telephone Encounter (Signed)
With her heart issues, there are no other safe generic options available.  Does she have a list of preferred medications to choose from (I.e. Tiers).  She will have to be on a name brand option.  Pioglitazone will cause fluid retention.

## 2019-10-20 NOTE — Telephone Encounter (Signed)
I looked over her plan and all name brand medication will be expensive so I did a tier exception to her ins through covermymeds and will await that decision.

## 2019-10-26 NOTE — Telephone Encounter (Signed)
Just FYI - ins denied lower co-pay for name brand medications.

## 2019-10-26 NOTE — Telephone Encounter (Signed)
She could try actos 30 mg poqday insteda of jardiance and recheck hga1c in 3 months

## 2019-10-26 NOTE — Telephone Encounter (Signed)
Received decision from insurance and they denied tier exception for the following reason:  The brand drug is covered at cost-sharing (copay) tier listed in current Drug Guide. Medicare changed the tier exception (copay reduction) rules in 2019. Since there is not a brand name drug in your lower cost-sharing tier, a tier exception will not be permitted.

## 2019-10-27 MED ORDER — PIOGLITAZONE HCL 30 MG PO TABS: 30.0000 mg | ORAL_TABLET | Freq: Every day | ORAL | 3 refills | Status: DC

## 2019-10-27 NOTE — Telephone Encounter (Signed)
Patient aware of results and med sent to [facility]

## 2019-10-28 ENCOUNTER — Other Ambulatory Visit: Payer: Self-pay

## 2019-10-28 ENCOUNTER — Ambulatory Visit
Admission: RE | Admit: 2019-10-28 | Discharge: 2019-10-28 | Disposition: A | Discharge status: Home / Self Care | Payer: Medicare HMO | Source: Ambulatory Visit | Attending: Family Medicine | Admitting: Family Medicine

## 2019-10-28 DIAGNOSIS — M546 Pain in thoracic spine: Secondary | ICD-10-CM | POA: Diagnosis not present

## 2019-10-28 DIAGNOSIS — M549 Dorsalgia, unspecified: Secondary | ICD-10-CM

## 2019-10-29 ENCOUNTER — Other Ambulatory Visit: Payer: Self-pay | Admitting: Family Medicine

## 2019-10-29 DIAGNOSIS — M546 Pain in thoracic spine: Secondary | ICD-10-CM

## 2019-11-01 ENCOUNTER — Other Ambulatory Visit: Payer: Self-pay | Admitting: Family Medicine

## 2019-11-01 ENCOUNTER — Telehealth: Payer: Self-pay | Admitting: Family Medicine

## 2019-11-01 MED ORDER — HYDROCODONE-ACETAMINOPHEN 5-325 MG PO TABS: 1.0000 | ORAL_TABLET | Freq: Four times a day (QID) | ORAL | 0 refills | Status: DC | PRN

## 2019-11-01 NOTE — Telephone Encounter (Signed)
PT aware

## 2019-11-01 NOTE — Telephone Encounter (Signed)
I will send in norco.

## 2019-11-01 NOTE — Telephone Encounter (Signed)
Husband called in asking what can patient take for pain. He states that she is hurting in her back.  CB# [phone]

## 2019-11-03 NOTE — Telephone Encounter (Signed)
Pt called back and states that the hydrocodone makes her feel jittery and would like to know if there was something else we can give her ?

## 2019-11-04 NOTE — Telephone Encounter (Signed)
I will send in tramadol.

## 2019-11-04 NOTE — Telephone Encounter (Signed)
Pt aware.

## 2019-11-12 ENCOUNTER — Other Ambulatory Visit: Payer: Self-pay | Admitting: Family Medicine

## 2019-11-12 MED ORDER — ZOLPIDEM TARTRATE 10 MG PO TABS: 10.0000 mg | ORAL_TABLET | Freq: Every evening | ORAL | 1 refills | Status: DC | PRN

## 2019-11-12 NOTE — Telephone Encounter (Signed)
Pt called and states that she can get her Ambien through [facility] and it will be a lot cheaper for her.   LOV: 11/01/2019  LRF: 10/12/2019   RX Set up to go to [facility] if approved.

## 2019-11-14 ENCOUNTER — Other Ambulatory Visit: Payer: Self-pay

## 2019-11-14 ENCOUNTER — Ambulatory Visit
Admission: RE | Admit: 2019-11-14 | Discharge: 2019-11-14 | Disposition: A | Discharge status: Home / Self Care | Payer: Medicare HMO | Source: Ambulatory Visit | Attending: Family Medicine | Admitting: Family Medicine

## 2019-11-14 DIAGNOSIS — M5124 Other intervertebral disc displacement, thoracic region: Secondary | ICD-10-CM | POA: Diagnosis not present

## 2019-11-14 DIAGNOSIS — M546 Pain in thoracic spine: Secondary | ICD-10-CM

## 2019-11-17 ENCOUNTER — Other Ambulatory Visit: Payer: Self-pay | Admitting: Cardiovascular Disease

## 2019-11-22 ENCOUNTER — Other Ambulatory Visit: Payer: Self-pay | Admitting: Family Medicine

## 2019-11-22 DIAGNOSIS — Z1231 Encounter for screening mammogram for malignant neoplasm of breast: Secondary | ICD-10-CM

## 2019-12-06 ENCOUNTER — Other Ambulatory Visit: Payer: Self-pay | Admitting: Family Medicine

## 2019-12-08 IMAGING — CT NM PET TUM IMG INITIAL (PI) SKULL BASE T - THIGH
1 of 8 series · 2 of 25 positions shown · non-contrast
Comparison: CTA 10/14/2018

CLINICAL DATA: Initial treatment strategy for pulmonary nodule.

EXAM:
NUCLEAR MEDICINE PET SKULL BASE TO THIGH
TECHNIQUE: 9.3 mCi F-18 FDG was injected intravenously. Full-ring PET imaging
was performed from the skull base to thigh after the radiotracer. CT
data was obtained and used for attenuation correction and anatomic
localization.
Fasting blood glucose: 191 mg/dl

[Series 4: ct sk_thigh 5.0 b31f · axial · 5.0mm · 0.98mm/px · z∈[-1016,-128]mm · 2 of 223 slices shown]
[im 1/223  brain]
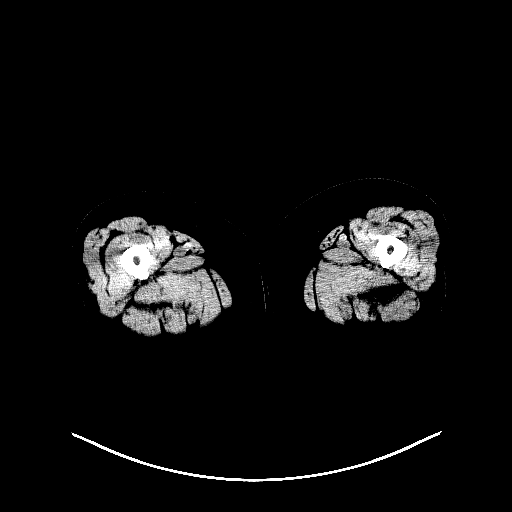
[im 223/223  brain]
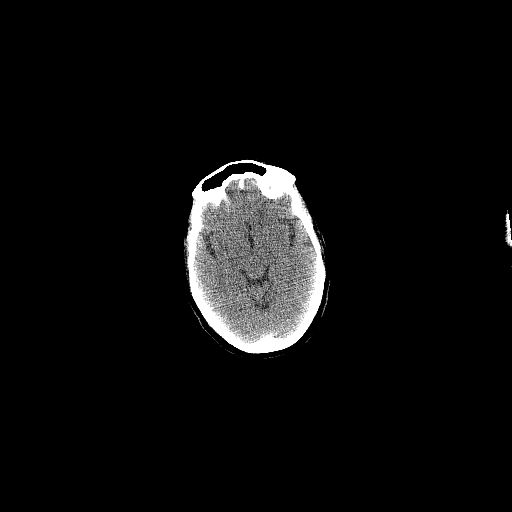

[2 of 25 positions shown; findings below may reference images not displayed]

FINDINGS: Mediastinal blood pool activity: SUV max

Liver activity: SUV max NA

NECK: No hypermetabolic lymph nodes in the neck.

Incidental CT findings: none

CHEST: No hypermetabolic mediastinal or hilar nodes. The left lower
lobe pulmonary nodule seen on the 10/14/2018 exam is identified on
image 92 today, measuring 10 x 11 mm, stable since 10/14/2018. There
is no hypermetabolism within this nodule. SUV max = 1.4.

Incidental CT findings: Ground-glass nodules are identified in the
right upper lobe, measuring up to 9 mm (images 61, 62/series 4).
These are stable since 10/14/2018 and show no hypermetabolism. 6 mm
ground-glass nodule in the left lower lobe (63/4) is also stable. No
pleural effusion. There is abdominal aortic atherosclerosis without
aneurysm. Status post aortic valve replacement. Coronary artery
calcification is evident.

ABDOMEN/PELVIS: No abnormal hypermetabolic activity within the
liver, pancreas, adrenal glands, or spleen. No hypermetabolic lymph
nodes in the abdomen or pelvis.

Incidental CT findings: Nodular hepatic contour suggests cirrhosis.
Spleen measures 16.7 cm craniocaudal length, enlarged. There is
abdominal aortic atherosclerosis without aneurysm. Diverticular
disease noted left colon without diverticulitis.

SKELETON: No focal hypermetabolic activity to suggest skeletal
metastasis.

Incidental CT findings: No worrisome lytic or sclerotic osseous
abnormality.
IMPRESSION: 1. 10 x 11 mm left lower lobe pulmonary nodule is stable since
10/14/2018 and shows no hypermetabolism on PET imaging. While these
results are reassuring, well differentiated or low-grade neoplasm
can be poorly FDG avid. Consider surveillance to ensure stability.
2. Bilateral ground-glass nodules measuring up to 9 mm, also stable
since 10/14/2018 without hypermetabolic FDG accumulation. Given
persistence, repeat CT is recommended every 2 years until 5 years of
stability has been established. This recommendation follows the
consensus statement: Guidelines for Management of Incidental
Pulmonary Nodules Detected on CT Images: From the [HOSPITAL]
3. Nodular hepatic contour is compatible with cirrhosis.
4. Splenomegaly.
5.  Aortic Atherosclerois ([id number]-170.0)

## 2019-12-17 ENCOUNTER — Encounter: Payer: Self-pay | Admitting: Family Medicine

## 2019-12-17 ENCOUNTER — Other Ambulatory Visit: Payer: Self-pay

## 2019-12-17 ENCOUNTER — Ambulatory Visit (INDEPENDENT_AMBULATORY_CARE_PROVIDER_SITE_OTHER): Payer: Medicare HMO | Admitting: Family Medicine

## 2019-12-17 VITALS — BP 126/74 | HR 66 | Temp 98.2°F | Resp 14 | Ht 64.5 in | Wt 206.0 lb

## 2019-12-17 DIAGNOSIS — N390 Urinary tract infection, site not specified: Secondary | ICD-10-CM

## 2019-12-17 DIAGNOSIS — R300 Dysuria: Secondary | ICD-10-CM | POA: Diagnosis not present

## 2019-12-17 LAB — URINALYSIS, ROUTINE W REFLEX MICROSCOPIC
Bilirubin Urine: NEGATIVE
Glucose, UA: NEGATIVE
Hgb urine dipstick: NEGATIVE
Hyaline Cast: NONE SEEN /LPF
Leukocytes,Ua: NEGATIVE
Nitrite: POSITIVE — AB
Specific Gravity, Urine: 1.027 (ref 1.001–1.03)
pH: 5.5 (ref 5.0–8.0)

## 2019-12-17 LAB — MICROSCOPIC MESSAGE

## 2019-12-17 MED ORDER — FLUCONAZOLE 150 MG PO TABS: ORAL_TABLET | ORAL | 0 refills | Status: DC

## 2019-12-17 MED ORDER — CIPROFLOXACIN HCL 500 MG PO TABS: 500.0000 mg | ORAL_TABLET | Freq: Two times a day (BID) | ORAL | 0 refills | Status: DC

## 2019-12-17 NOTE — Patient Instructions (Signed)
F/U as needed

## 2019-12-17 NOTE — Progress Notes (Signed)
   Subjective:    Patient ID: Kelly [name], female    DOB: [birth year], 69 y.o.   MRN: [id number]  Patient presents for Dysuria (x2 days- lower abd discomfort, orange color to urine, increased pressure)   Pthere with dark urine , orange appearance with fatigue and lower abd pain for the past 2 days. No fever, no chills, no vomiting or diarrhea   She has not had bladder infection in years. She has history of Interstitial cystitis   DM- Last A1C 10.2% in March, recently started on jardiance, glipizide  And metformin  No vaginal discharge, no vaginal bleeding     Review Of Systems:  GEN- denies fatigue, fever, weight loss,weakness, recent illness HEENT- denies eye drainage, change in vision, nasal discharge, CVS- denies chest pain, palpitations RESP- denies SOB, cough, wheeze ABD- denies N/V, change in stools, abd pain GU- + dysuria,+ hematuria, dribbling, incontinence MSK- denies joint pain, muscle aches, injury Neuro- denies headache, dizziness, syncope, seizure activity       Objective:    BP 126/74   Pulse 66   Temp 98.2 F (36.8 C) (Temporal)   Resp 14   Ht 5' 4.5" (1.638 m)   Wt 206 lb (93.4 kg)   SpO2 98%   BMI 34.81 kg/m  GEN- NAD, alert and oriented x3 HEENT- PERRL, EOMI, non injected sclera, pink conjunctiva, MMM, oropharynx clear CVS- RRR, systolic murmur (valve replacement) RESP-CTAB ABD-NABS,soft,mild ttp suprapubic region,no CVA tenderness, ND EXT- No edema Pulses- Radial 2+        Assessment & Plan:      Problem List Items Addressed This Visit    None    Visit Diagnoses    Urinary tract infection without hematuria, site unspecified    -  Primary   UTI, first one after starting jardiance, discussed this can be affect of the med, we will treat through, will also give diflucan as she gets yeast infection, if she has recurrence of infections [place] need alternative diabetic med   Relevant Medications   fluconazole (DIFLUCAN) 150 MG tablet   Other  Relevant Orders   Urinalysis, Routine w reflex microscopic (Completed)   Urine Culture      Note: This dictation was prepared with Dragon dictation along with smaller phrase technology. Any transcriptional errors that result from this process are unintentional.

## 2019-12-19 ENCOUNTER — Encounter: Payer: Self-pay | Admitting: Family Medicine

## 2019-12-19 LAB — URINE CULTURE
MICRO NUMBER:: 10479288
SPECIMEN QUALITY:: ADEQUATE

## 2019-12-22 ENCOUNTER — Telehealth: Payer: Self-pay | Admitting: Family Medicine

## 2019-12-22 NOTE — Telephone Encounter (Signed)
Patient is requesting a refill on Hydrocodone   LOV:  12/17/2019  LRF:   11/01/2019

## 2019-12-23 ENCOUNTER — Other Ambulatory Visit: Payer: Self-pay | Admitting: Family Medicine

## 2019-12-23 MED ORDER — HYDROCODONE-ACETAMINOPHEN 5-325 MG PO TABS: 1.0000 | ORAL_TABLET | Freq: Four times a day (QID) | ORAL | 0 refills | Status: DC | PRN

## 2019-12-31 ENCOUNTER — Other Ambulatory Visit: Payer: Medicare HMO

## 2019-12-31 ENCOUNTER — Telehealth: Payer: Self-pay | Admitting: *Deleted

## 2019-12-31 ENCOUNTER — Other Ambulatory Visit: Payer: Self-pay

## 2019-12-31 ENCOUNTER — Other Ambulatory Visit: Payer: Self-pay | Admitting: *Deleted

## 2019-12-31 DIAGNOSIS — R300 Dysuria: Secondary | ICD-10-CM

## 2019-12-31 LAB — URINALYSIS, ROUTINE W REFLEX MICROSCOPIC
Bilirubin Urine: NEGATIVE
Glucose, UA: NEGATIVE
Hyaline Cast: NONE SEEN /LPF
Ketones, ur: NEGATIVE
Nitrite: NEGATIVE
Specific Gravity, Urine: 1.02 (ref 1.001–1.03)
pH: 6 (ref 5.0–8.0)

## 2019-12-31 LAB — MICROSCOPIC MESSAGE

## 2019-12-31 MED ORDER — SULFAMETHOXAZOLE-TRIMETHOPRIM 800-160 MG PO TABS
1.0000 | ORAL_TABLET | Freq: Two times a day (BID) | ORAL | 0 refills | Status: DC
Start: 2019-12-31 — End: 2020-02-24

## 2019-12-31 NOTE — Telephone Encounter (Signed)
Patient stopped by office with c/o continued urinary issues.   Reports that lower abd pressure and dark colored concentrated urine noted.   Advised to leave urine sample for UA.

## 2020-01-01 LAB — URINE CULTURE
MICRO NUMBER:: 10532158
SPECIMEN QUALITY:: ADEQUATE

## 2020-01-08 ENCOUNTER — Other Ambulatory Visit: Payer: Self-pay | Admitting: Family Medicine

## 2020-01-18 ENCOUNTER — Other Ambulatory Visit: Payer: Self-pay | Admitting: Family Medicine

## 2020-01-18 MED ORDER — HYDROCODONE-ACETAMINOPHEN 5-325 MG PO TABS: 1.0000 | ORAL_TABLET | Freq: Four times a day (QID) | ORAL | 0 refills | Status: DC | PRN

## 2020-01-18 NOTE — Telephone Encounter (Signed)
Patient called to make an appointment she is scheduled for Monday 01/24/20. She is also requesting a refill on her hydrocodone, she states that she has been using to help with a tooth issue she is waiting to hear back from dentist of when they can do the surgery.  CB#[phone]

## 2020-01-18 NOTE — Telephone Encounter (Signed)
Rx refilled.

## 2020-01-18 NOTE — Telephone Encounter (Signed)
Last refill 12/23/2019

## 2020-01-21 ENCOUNTER — Other Ambulatory Visit: Payer: Self-pay | Admitting: Family Medicine

## 2020-01-24 ENCOUNTER — Ambulatory Visit (INDEPENDENT_AMBULATORY_CARE_PROVIDER_SITE_OTHER): Payer: Medicare HMO | Admitting: Family Medicine

## 2020-01-24 ENCOUNTER — Other Ambulatory Visit: Payer: Self-pay

## 2020-01-24 ENCOUNTER — Telehealth: Payer: Self-pay | Admitting: Family Medicine

## 2020-01-24 VITALS — BP 130/64 | HR 49 | Temp 98.2°F | Ht 64.5 in | Wt 210.0 lb

## 2020-01-24 DIAGNOSIS — I2510 Atherosclerotic heart disease of native coronary artery without angina pectoris: Secondary | ICD-10-CM | POA: Diagnosis not present

## 2020-01-24 DIAGNOSIS — E1165 Type 2 diabetes mellitus with hyperglycemia: Secondary | ICD-10-CM | POA: Diagnosis not present

## 2020-01-24 DIAGNOSIS — D508 Other iron deficiency anemias: Secondary | ICD-10-CM

## 2020-01-24 DIAGNOSIS — R350 Frequency of micturition: Secondary | ICD-10-CM

## 2020-01-24 DIAGNOSIS — Z952 Presence of prosthetic heart valve: Secondary | ICD-10-CM | POA: Diagnosis not present

## 2020-01-24 NOTE — Progress Notes (Signed)
Subjective:    Patient ID: Kelly [name], female    DOB: 09-03-1950, 69 y.o.   MRN: [id number]  HPI  PMH significant for coronary artery stenting for coronary artery disease.  She also had aortic valve replacement via transcatheter approach due to her severe aortic stenosis.  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  In March, A1c was 10.2 and I recommended adding jardiance and glipizide.  Could not afford jardiance so we had to use actos.  Patient has not been checking her blood sugar.  She does report increased urinary frequency.  She denies any dysuria.  She denies any hematuria.  She denies any blurry vision however she does report increased thirst.  She denies any rapid weight loss.  She denies any chest pain or shortness of breath however she does report severe fatigue.  She states that she has very little energy.  She denies any melena or hematochezia.  She denies any nausea or vomiting.  She denies any diarrhea.  She denies any cough or pleurisy. Past Medical History:  Diagnosis Date  . Anxiety   . Chronic upper GI bleeding    intestinal AVM's, portal gastropathy, esophageal varices on egd 04/2018  . Coronary artery disease involving native coronary artery without angina pectoris   . Dementia ([place]) 09/16/2016  . Depression   . DIVERTICULITIS OF COLON 06/14/2008   Qualifier: Diagnosis of  By: [name]    . DM (diabetes mellitus), type 2 ([place]) 09/15/2016  . Esophageal varices determined by endoscopy ([place])   . Hyperlipidemia   . Hypertension   . Hypothyroidism   . Incidental pulmonary nodule, greater than or equal to 73m 10/14/2018   LLL nodule needs f/u CT or PET-CT scan  . Irritable bowel syndrome 04/06/2008   Centricity Description: IBS Qualifier: Diagnosis of  By: [name] (ADeborra Medina, [name]   Centricity Description: IRRITABLE BOWEL SYNDROME Qualifier: Diagnosis of  By: [name], [name]  . Liver cirrhosis secondary to NASH ([place] 09/16/2016  . NASH  (nonalcoholic steatohepatitis)   . Obesity   . Portal hypertensive gastropathy ([place]   . 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 04/20/19: s/p TAVR wtih a 246mMedtronic CoreValve Evolut Pro via the TF approach  . Severe aortic stenosis   . Splenomegaly 09/16/2016  . Thrombocytopenia ([place]  . Thyroid disease   . Tobacco abuse   . Vitamin D deficiency    Past Surgical History:  Procedure Laterality Date  . CARDIAC CATHETERIZATION    . COLONOSCOPY WITH PROPOFOL N/A 04/09/2018   Procedure: COLONOSCOPY WITH PROPOFOL;  Surgeon: JaMilus BanisterMD;  [facility];  Service: Endoscopy;  Laterality: N/A;  . CORONARY ANGIOPLASTY    . CORONARY STENT INTERVENTION N/A 02/11/2019   Procedure: CORONARY STENT INTERVENTION;  Surgeon: McBurnell BlanksMD;  [facility];  Service: Cardiovascular;  Laterality: N/A;  Prox RCA Prox LAD  . ESOPHAGOGASTRODUODENOSCOPY (EGD) WITH PROPOFOL N/A 04/09/2018   Procedure: ESOPHAGOGASTRODUODENOSCOPY (EGD) WITH PROPOFOL;  Surgeon: JaMilus BanisterMD;  [facility];  Service: Endoscopy;  Laterality: N/A;  . oopherectomy    . OVARIAN CYST REMOVAL    . POLYPECTOMY  04/09/2018   Procedure: POLYPECTOMY;  Surgeon: JaMilus BanisterMD;  [facility];  Service: Endoscopy;;  . RIGHT/LEFT HEART CATH AND CORONARY ANGIOGRAPHY N/A 10/07/2018   Procedure: RIGHT/LEFT HEART CATH AND CORONARY ANGIOGRAPHY;  Surgeon: McLauree Chandler  D, MD;  Location: [facility];  Service: Cardiovascular;  Laterality: N/A;  . TEE WITHOUT CARDIOVERSION N/A 04/20/2019   Procedure: TRANSESOPHAGEAL ECHOCARDIOGRAM (TEE);  Surgeon: [name], MD;  Location: [facility];  Service: Open Heart Surgery;  Laterality: N/A;  . TRANSCATHETER AORTIC VALVE REPLACEMENT, TRANSFEMORAL N/A 04/20/2019   Procedure: TRANSCATHETER AORTIC VALVE REPLACEMENT, TRANSFEMORAL approach using 18m Evolut PRO+ medtronic valve;  Surgeon:  MBurnell Blanks MD;  [facility]  Service: Open Heart Surgery;  Laterality: N/A;   Current Outpatient Medications on File Prior to Visit  Medication Sig Dispense Refill  . ACCU-CHEK AVIVA PLUS test strip     . ACCU-CHEK SOFTCLIX LANCETS lancets     . amLODipine (NORVASC) 10 MG tablet TAKE 1 TABLET BY MOUTH EVERY DAY 90 tablet 3  . aspirin EC 81 MG tablet Take 1 tablet (81 mg total) by mouth daily. 90 tablet 3  . atorvastatin (LIPITOR) 20 MG tablet TAKE 1 TABLET EVERY DAY 90 tablet 3  . Blood Glucose Monitoring Suppl (ACCU-CHEK AVIVA PLUS) w/Device KIT     . ciprofloxacin (CIPRO) 500 MG tablet Take 1 tablet (500 mg total) by mouth 2 (two) times daily. 14 tablet 0  . clindamycin (CLEOCIN) 300 MG capsule Take 2 capsules (600 mg) 1 hour prior to all dental visits. 4 capsule 8  . clopidogrel (PLAVIX) 75 MG tablet Take 1 tablet (75 mg total) by mouth daily. 90 tablet 3  . FEROSUL 325 (65 Fe) MG tablet TAKE 1 TABLET (325 MG TOTAL) BY MOUTH DAILY. 90 tablet 3  . fluconazole (DIFLUCAN) 150 MG tablet Take 1 tablet repeat in 3 days for yeast infection 2 tablet 0  . furosemide (LASIX) 20 MG tablet TAKE 1 TABLET EVERY DAY 90 tablet 1  . glipiZIDE (GLUCOTROL XL) 10 MG 24 hr tablet TAKE 1 TABLET (10 MG TOTAL) BY MOUTH DAILY WITH BREAKFAST. 90 tablet 0  . HYDROcodone-acetaminophen (NORCO) 5-325 MG tablet Take 1 tablet by mouth every 6 (six) hours as needed for moderate pain. 30 tablet 0  . levothyroxine (SYNTHROID) 75 MCG tablet TAKE 1 TABLET EVERY DAY 90 tablet 3  . losartan (COZAAR) 100 MG tablet TAKE 1 TABLET EVERY DAY 90 tablet 1  . metFORMIN (GLUCOPHAGE-XR) 500 MG 24 hr tablet TAKE 2 TABLETS TWICE DAILY 360 tablet 1  . pantoprazole (PROTONIX) 40 MG tablet TAKE 1 TABLET EVERY DAY 90 tablet 3  . pioglitazone (ACTOS) 30 MG tablet Take 1 tablet (30 mg total) by mouth daily. 90 tablet 3  . potassium chloride (KLOR-CON) 10 MEQ tablet TAKE 2 TABLETS BY MOUTH DAILY 180 tablet 1  . potassium  chloride (KLOR-CON) 10 MEQ tablet TAKE 2 TABLETS (20 MEQ TOTAL) BY MOUTH DAILY. 180 tablet 1  . sulfamethoxazole-trimethoprim (BACTRIM DS) 800-160 MG tablet Take 1 tablet by mouth 2 (two) times daily. 14 tablet 0  . zolpidem (AMBIEN) 10 MG tablet Take 1 tablet (10 mg total) by mouth at bedtime as needed for sleep. 90 tablet 1   Current Facility-Administered Medications on File Prior to Visit  Medication Dose Route Frequency Provider Last Rate Last Admin  . cyanocobalamin ((VITAMIN B-12)) injection 1,000 mcg  1,000 mcg Subcutaneous Q30 days PSusy Frizzle MD   1,000 mcg at 09/08/17 1408   Allergies  Allergen Reactions  . Prednisone Itching    Heart rate increased  . Celecoxib Rash  . Macrodantin [Nitrofurantoin Macrocrystal] Rash  . Penicillins Hives    Did it involve swelling of the  face/tongue/throat, SOB, or low BP? No Did it involve sudden or severe rash/hives, skin peeling, or any reaction on the inside of your mouth or nose? No Did you need to seek medical attention at a hospital or doctor's office? No When did it last happen?20 + years If all above answers are "NO", [place] proceed with cephalosporin use.    Social History   Socioeconomic History  . Marital status: Married    Spouse name: Not on file  . Number of children: 1  . Years of education: Not on file  . Highest education level: Not on file  Occupational History  . Not on file  Tobacco Use  . Smoking status: Current Every Day Smoker    Packs/day: 0.50    Types: Cigarettes  . Smokeless tobacco: Never Used  Vaping Use  . Vaping Use: Never used  Substance and Sexual Activity  . Alcohol use: No  . Drug use: Not Currently    Types: Benzodiazepines  . Sexual activity: Not Currently  Other Topics Concern  . Not on file  Social History Narrative  . Not on file   Social Determinants of Health   Financial Resource Strain:   . Difficulty of Paying Living Expenses:   Food Insecurity:   . Worried About  Charity fundraiser in the Last Year:   . Arboriculturist in the Last Year:   Transportation Needs:   . Film/video editor (Medical):   Marland Kitchen Lack of Transportation (Non-Medical):   Physical Activity:   . Days of Exercise per Week:   . Minutes of Exercise per Session:   Stress:   . Feeling of Stress :   Social Connections:   . Frequency of Communication with Friends and Family:   . Frequency of Social Gatherings with Friends and Family:   . Attends Religious Services:   . Active Member of Clubs or Organizations:   . Attends Archivist Meetings:   Marland Kitchen Marital Status:   Intimate Partner Violence:   . Fear of Current or Ex-Partner:   . Emotionally Abused:   Marland Kitchen Physically Abused:   . Sexually Abused:        Review of Systems  All other systems reviewed and are negative.      Objective:   Physical Exam Vitals reviewed.  Constitutional:      General: She is not in acute distress.    Appearance: She is obese. She is not ill-appearing, toxic-appearing or diaphoretic.  Cardiovascular:     Rate and Rhythm: Normal rate and regular rhythm.     Heart sounds: Murmur heard.   Pulmonary:     Effort: Pulmonary effort is normal. No respiratory distress.     Breath sounds: Normal breath sounds. No wheezing, rhonchi or rales.  Abdominal:     General: Bowel sounds are normal. There is no distension.     Palpations: Abdomen is soft.     Tenderness: There is no abdominal tenderness. There is no guarding.  Musculoskeletal:     Right lower leg: No edema.     Left lower leg: No edema.  Neurological:     Mental Status: She is alert.           Assessment & Plan:  Uncontrolled type 2 diabetes mellitus with hyperglycemia (HCC) - Plan: Hemoglobin A1c, CBC with Differential/Platelet, COMPLETE METABOLIC PANEL WITH GFR, Microalbumin, urine  Coronary artery disease involving native coronary artery of native heart without angina pectoris - Plan: Lipid panel  S/P TAVR  (transcatheter aortic valve replacement)  Other iron deficiency anemia  Given her fatigue and her history of a GI bleed, I will check a CBC to screen for any symptomatic anemia.  I will check a CMP to monitor her kidney and liver function.  I will check a hemoglobin A1c to ensure that her diabetes is not out of control.  Blood pressure today is acceptable.  Also check fasting lipid panel.  Goal LDL cholesterol is less than 70.  Await the results of her fasting lab work.

## 2020-01-24 NOTE — Progress Notes (Signed)
  Chronic Care Management   Outreach Note  01/24/2020 Name: Kelly [name] MRN: [id number] DOB: [birth year]  Referred by: [name], MD Reason for referral : Chronic Care Management   An unsuccessful telephone outreach was attempted today. The patient was referred to the pharmacist for assistance with care management and care coordination.   Follow Up Plan:  [place]

## 2020-01-25 LAB — CBC WITH DIFFERENTIAL/PLATELET
Absolute Monocytes: 646 cells/uL (ref 200–950)
Basophils Absolute: 48 cells/uL (ref 0–200)
Basophils Relative: 0.7 %
Eosinophils Absolute: 252 cells/uL (ref 15–500)
Eosinophils Relative: 3.7 %
HCT: 37.2 % (ref 35.0–45.0)
Hemoglobin: 12.7 g/dL (ref 11.7–15.5)
Lymphs Abs: 2298 cells/uL (ref 850–3900)
MCH: 28.5 pg (ref 27.0–33.0)
MCHC: 34.1 g/dL (ref 32.0–36.0)
MCV: 83.6 fL (ref 80.0–100.0)
MPV: 12.1 fL (ref 7.5–12.5)
Monocytes Relative: 9.5 %
Neutro Abs: 3556 cells/uL (ref 1500–7800)
Neutrophils Relative %: 52.3 %
Platelets: 97 10*3/uL — ABNORMAL LOW (ref 140–400)
RBC: 4.45 10*6/uL (ref 3.80–5.10)
RDW: 15 % (ref 11.0–15.0)
Total Lymphocyte: 33.8 %
WBC: 6.8 10*3/uL (ref 3.8–10.8)

## 2020-01-25 LAB — COMPLETE METABOLIC PANEL WITH GFR
AG Ratio: 1.4 (calc) (ref 1.0–2.5)
ALT: 12 U/L (ref 6–29)
AST: 21 U/L (ref 10–35)
Albumin: 3.9 g/dL (ref 3.6–5.1)
Alkaline phosphatase (APISO): 87 U/L (ref 37–153)
BUN/Creatinine Ratio: 14 (calc) (ref 6–22)
BUN: 13 mg/dL (ref 7–25)
CO2: 21 mmol/L (ref 20–32)
Calcium: 9.7 mg/dL (ref 8.6–10.4)
Chloride: 109 mmol/L (ref 98–110)
Creat: 0.95 mg/dL — ABNORMAL HIGH (ref 0.60–0.93)
GFR, Est African American: 70 mL/min/{1.73_m2} (ref >=60)
GFR, Est Non African American: 60 mL/min/{1.73_m2} (ref >=60)
Globulin: 2.8 g/dL (calc) (ref 1.9–3.7)
Glucose, Bld: 106 mg/dL — ABNORMAL HIGH (ref 65–99)
Potassium: 4.4 mmol/L (ref 3.5–5.3)
Sodium: 141 mmol/L (ref 135–146)
Total Bilirubin: 0.8 mg/dL (ref 0.2–1.2)
Total Protein: 6.7 g/dL (ref 6.1–8.1)

## 2020-01-25 LAB — LIPID PANEL
Cholesterol: 164 mg/dL (ref <200)
HDL: 53 mg/dL (ref >=50)
LDL Cholesterol (Calc): 88 mg/dL (calc)
Non-HDL Cholesterol (Calc): 111 mg/dL (calc) (ref <130)
Total CHOL/HDL Ratio: 3.1 (calc) (ref <5.0)
Triglycerides: 133 mg/dL (ref <150)

## 2020-01-25 LAB — URINALYSIS, ROUTINE W REFLEX MICROSCOPIC
Bacteria, UA: NONE SEEN /HPF
Bilirubin Urine: NEGATIVE
Glucose, UA: NEGATIVE
Hgb urine dipstick: NEGATIVE
Hyaline Cast: NONE SEEN /LPF
Ketones, ur: NEGATIVE
Nitrite: NEGATIVE
Protein, ur: NEGATIVE
Specific Gravity, Urine: 1.013 (ref 1.001–1.03)
pH: 5.5 (ref 5.0–8.0)

## 2020-01-25 LAB — HEMOGLOBIN A1C
Hgb A1c MFr Bld: 5.9 % of total Hgb — ABNORMAL HIGH (ref <5.7)
Mean Plasma Glucose: 123 (calc)
eAG (mmol/L): 6.8 (calc)

## 2020-01-25 LAB — MICROALBUMIN, URINE: Microalb, Ur: 1.5 mg/dL

## 2020-01-31 ENCOUNTER — Telehealth: Payer: Self-pay | Admitting: Family Medicine

## 2020-01-31 NOTE — Progress Notes (Signed)
  Chronic Care Management   Note  01/31/2020 Name: Surabhi Gadea Corea MRN: [id number] DOB: [birth year]  Meyer Russel Berdan is a 69 y.o. year old female who is a primary care patient of [name], MD. I reached out to Tierrah Anastos Wolfinger by phone today in response to a referral sent by Ms. Meyer Russel Avakian's PCP, [name], MD.   Ms. Selph was given information about Chronic Care Management services today including:  1. CCM service includes personalized support from designated clinical staff supervised by her physician, including individualized plan of care and coordination with other care providers 2. 24/7 contact phone numbers for assistance for urgent and routine care needs. 3. Service will only be billed when office clinical staff spend 20 minutes or more in a month to coordinate care. 4. Only one practitioner [place] furnish and bill the service in a calendar month. 5. The patient [place] stop CCM services at any time (effective at the end of the month) by phone call to the office staff.   Patient agreed to services and verbal consent obtained.   Follow up plan:   [place]

## 2020-02-07 ENCOUNTER — Other Ambulatory Visit: Payer: Self-pay | Admitting: Cardiovascular Disease

## 2020-02-09 ENCOUNTER — Other Ambulatory Visit: Payer: Self-pay

## 2020-02-09 NOTE — Telephone Encounter (Signed)
Last refilled 01/18/20

## 2020-02-10 MED ORDER — HYDROCODONE-ACETAMINOPHEN 5-325 MG PO TABS: 1.0000 | ORAL_TABLET | Freq: Four times a day (QID) | ORAL | 0 refills | Status: DC | PRN

## 2020-02-14 ENCOUNTER — Other Ambulatory Visit: Payer: Self-pay | Admitting: Physician Assistant

## 2020-02-18 ENCOUNTER — Other Ambulatory Visit: Payer: Self-pay | Admitting: Family Medicine

## 2020-02-24 ENCOUNTER — Ambulatory Visit (INDEPENDENT_AMBULATORY_CARE_PROVIDER_SITE_OTHER): Payer: Medicare HMO | Admitting: Family Medicine

## 2020-02-24 ENCOUNTER — Other Ambulatory Visit: Payer: Self-pay

## 2020-02-24 VITALS — BP 140/70 | HR 58 | Temp 98.2°F | Wt 210.0 lb

## 2020-02-24 DIAGNOSIS — R350 Frequency of micturition: Secondary | ICD-10-CM

## 2020-02-24 LAB — URINALYSIS, ROUTINE W REFLEX MICROSCOPIC
Bilirubin Urine: NEGATIVE
Glucose, UA: NEGATIVE
Hyaline Cast: NONE SEEN /LPF
Ketones, ur: NEGATIVE
Nitrite: POSITIVE — AB
Specific Gravity, Urine: 1.025 (ref 1.001–1.03)
pH: 5.5 (ref 5.0–8.0)

## 2020-02-24 LAB — MICROSCOPIC MESSAGE

## 2020-02-24 MED ORDER — SULFAMETHOXAZOLE-TRIMETHOPRIM 800-160 MG PO TABS: 1.0000 | ORAL_TABLET | Freq: Two times a day (BID) | ORAL | 0 refills | Status: DC

## 2020-02-24 MED ORDER — GLIPIZIDE ER 10 MG PO TB24: 10.0000 mg | ORAL_TABLET | Freq: Every day | ORAL | 0 refills | Status: DC

## 2020-02-24 NOTE — Progress Notes (Signed)
Subjective:    Patient ID: Kelly [name], female    DOB: 1950-11-21, 69 y.o.   MRN: [id number]  HPI  Patient reports a 1 week history of increased urinary frequency and urgency.  She also reports urinary hesitancy and dysuria with a foul-smelling urine.  Urinalysis today shows +2 leukocyte esterase, positive nitrites, and trace blood. Past Medical History:  Diagnosis Date   Anxiety    Chronic upper GI bleeding    intestinal AVM's, portal gastropathy, esophageal varices on egd 04/2018   Coronary artery disease involving native coronary artery without angina pectoris    Dementia ([place]) 09/16/2016   Depression    DIVERTICULITIS OF COLON 06/14/2008   Qualifier: Diagnosis of  By: Gretta Cool RN, Malachy Mood     DM (diabetes mellitus), type 2 ([place]) 09/15/2016   Esophageal varices determined by endoscopy ([place])    Hyperlipidemia    Hypertension    Hypothyroidism    Incidental pulmonary nodule, greater than or equal to 60m 10/14/2018   LLL nodule needs f/u CT or PET-CT scan   Irritable bowel syndrome 04/06/2008   Centricity Description: IBS Qualifier: Diagnosis of  By: [name] (ADeborra Medina, [name]   Centricity Description: IRRITABLE BOWEL SYNDROME Qualifier: Diagnosis of  By: [name], [name]   Liver cirrhosis secondary to NASH ([place] 09/16/2016   NASH (nonalcoholic steatohepatitis)    Obesity    Portal hypertensive gastropathy ([place]    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 04/20/19: s/p TAVR wtih a 262mMedtronic CoreValve Evolut Pro via the TF approach   Severe aortic stenosis    Splenomegaly 09/16/2016   Thrombocytopenia ([facility]   Thyroid disease    Tobacco abuse    Vitamin D deficiency    Past Surgical History:  Procedure Laterality Date   CARDIAC CATHETERIZATION     COLONOSCOPY WITH PROPOFOL N/A 04/09/2018   Procedure: COLONOSCOPY WITH PROPOFOL;  Surgeon: JaMilus BanisterMD;  [facility];  Service: Endoscopy;  Laterality: N/A;    CORONARY ANGIOPLASTY     CORONARY STENT INTERVENTION N/A 02/11/2019   Procedure: CORONARY STENT INTERVENTION;  Surgeon: McBurnell BlanksMD;  [facility];  Service: Cardiovascular;  Laterality: N/A;  Prox RCA Prox LAD   ESOPHAGOGASTRODUODENOSCOPY (EGD) WITH PROPOFOL N/A 04/09/2018   Procedure: ESOPHAGOGASTRODUODENOSCOPY (EGD) WITH PROPOFOL;  Surgeon: JaMilus BanisterMD;  [facility];  Service: Endoscopy;  Laterality: N/A;   oopherectomy     OVARIAN CYST REMOVAL     POLYPECTOMY  04/09/2018   Procedure: POLYPECTOMY;  Surgeon: JaMilus BanisterMD;  [facility];  Service: Endoscopy;;   RIGHT/LEFT HEART CATH AND CORONARY ANGIOGRAPHY N/A 10/07/2018   Procedure: RIGHT/LEFT HEART CATH AND CORONARY ANGIOGRAPHY;  Surgeon: McBurnell BlanksMD;  [facility];  Service: Cardiovascular;  Laterality: N/A;   TEE WITHOUT CARDIOVERSION N/A 04/20/2019   Procedure: TRANSESOPHAGEAL ECHOCARDIOGRAM (TEE);  Surgeon: McBurnell BlanksMD;  [facility] Service: Open Heart Surgery;  Laterality: N/A;   TRANSCATHETER AORTIC VALVE REPLACEMENT, TRANSFEMORAL N/A 04/20/2019   Procedure: TRANSCATHETER AORTIC VALVE REPLACEMENT, TRANSFEMORAL approach using 2386mvolut PRO+ medtronic valve;  Surgeon: McABurnell BlanksD;  [facility];  Laterality: N/A;   Current Outpatient Medications on File Prior to Visit  Medication Sig Dispense Refill   ACCU-CHEK AVIVA PLUS test strip      ACCU-CHEK SOFTCLIX LANCETS lancets      amLODipine (  NORVASC) 10 MG tablet TAKE 1 TABLET BY MOUTH EVERY DAY 90 tablet 3   aspirin EC 81 MG tablet Take 1 tablet (81 mg total) by mouth daily. 90 tablet 3   atorvastatin (LIPITOR) 20 MG tablet TAKE 1 TABLET EVERY DAY 90 tablet 3   Blood Glucose Monitoring Suppl (ACCU-CHEK AVIVA PLUS) w/Device KIT      clindamycin (CLEOCIN) 300 MG capsule Take 2 capsules (600 mg) 1 hour prior to  all dental visits. 4 capsule 8   clopidogrel (PLAVIX) 75 MG tablet Take 1 tablet (75 mg total) by mouth daily. Must sch overdue follow up appt for further refills [phone] 30 tablet 0   FEROSUL 325 (65 Fe) MG tablet TAKE 1 TABLET (325 MG TOTAL) BY MOUTH DAILY. 90 tablet 3   furosemide (LASIX) 20 MG tablet TAKE 1 TABLET EVERY DAY 90 tablet 1   glipiZIDE (GLUCOTROL XL) 10 MG 24 hr tablet TAKE 1 TABLET (10 MG TOTAL) BY MOUTH DAILY WITH BREAKFAST. 90 tablet 0   HYDROcodone-acetaminophen (NORCO) 5-325 MG tablet Take 1 tablet by mouth every 6 (six) hours as needed for moderate pain. 30 tablet 0   levothyroxine (SYNTHROID) 75 MCG tablet TAKE 1 TABLET EVERY DAY 90 tablet 3   losartan (COZAAR) 100 MG tablet TAKE 1 TABLET EVERY DAY 90 tablet 1   metFORMIN (GLUCOPHAGE-XR) 500 MG 24 hr tablet TAKE 2 TABLETS TWICE DAILY 360 tablet 1   pantoprazole (PROTONIX) 40 MG tablet TAKE 1 TABLET EVERY DAY 90 tablet 3   pioglitazone (ACTOS) 30 MG tablet Take 1 tablet (30 mg total) by mouth daily. 90 tablet 3   potassium chloride (KLOR-CON) 10 MEQ tablet TAKE 2 TABLETS BY MOUTH DAILY 180 tablet 1   zolpidem (AMBIEN) 10 MG tablet Take 1 tablet (10 mg total) by mouth at bedtime as needed for sleep. 90 tablet 1   Current Facility-Administered Medications on File Prior to Visit  Medication Dose Route Frequency Provider Last Rate Last Admin   cyanocobalamin ((VITAMIN B-12)) injection 1,000 mcg  1,000 mcg Subcutaneous Q30 days [name], MD   1,000 mcg at 09/08/17 1408    Allergies  Allergen Reactions   Prednisone Itching    Heart rate increased   Celecoxib Rash   Macrodantin [Nitrofurantoin Macrocrystal] Rash   Penicillins Hives    Did it involve swelling of the face/tongue/throat, SOB, or low BP? No Did it involve sudden or severe rash/hives, skin peeling, or any reaction on the inside of your mouth or nose? No Did you need to seek medical attention at a hospital or doctor's office?  No When did it last happen?20 + years If all above answers are "NO", [place] proceed with cephalosporin use.    Social History   Socioeconomic History   Marital status: Married    Spouse name: Not on file   Number of children: 1   Years of education: Not on file   Highest education level: Not on file  Occupational History   Not on file  Tobacco Use   Smoking status: Current Every Day Smoker    Packs/day: 0.50    Types: Cigarettes   Smokeless tobacco: Never Used  Vaping Use   Vaping Use: Never used  Substance and Sexual Activity   Alcohol use: No   Drug use: Not Currently    Types: Benzodiazepines   Sexual activity: Not Currently  Other Topics Concern   Not on file  Social History Narrative   Not on file   Social  Determinants of Health   Financial Resource Strain:    Difficulty of Paying Living Expenses:   Food Insecurity:    Worried About Charity fundraiser in the Last Year:    Arboriculturist in the Last Year:   Transportation Needs:    Film/video editor (Medical):    Lack of Transportation (Non-Medical):   Physical Activity:    Days of Exercise per Week:    Minutes of Exercise per Session:   Stress:    Feeling of Stress :   Social Connections:    Frequency of Communication with Friends and Family:    Frequency of Social Gatherings with Friends and Family:    Attends Religious Services:    Active Member of Clubs or Organizations:    Attends Music therapist:    Marital Status:   Intimate Partner Violence:    Fear of Current or Ex-Partner:    Emotionally Abused:    Physically Abused:    Sexually Abused:        Review of Systems  All other systems reviewed and are negative.      Objective:   Physical Exam Vitals reviewed.  Constitutional:      General: She is not in acute distress.    Appearance: She is obese. She is not ill-appearing, toxic-appearing or diaphoretic.  Cardiovascular:      Rate and Rhythm: Normal rate and regular rhythm.     Heart sounds: Murmur heard.   Pulmonary:     Effort: Pulmonary effort is normal. No respiratory distress.     Breath sounds: Normal breath sounds. No wheezing, rhonchi or rales.  Abdominal:     General: Bowel sounds are normal. There is no distension.     Palpations: Abdomen is soft.     Tenderness: There is no abdominal tenderness. There is no guarding.  Musculoskeletal:     Right lower leg: No edema.     Left lower leg: No edema.  Neurological:     Mental Status: She is alert.           Assessment & Plan:  Urinary frequency - Plan: Urine Culture, Urinalysis, Routine w reflex microscopic  Appears to have a urinary tract infection.  Begin Bactrim double strength tablets twice daily for 1 week

## 2020-02-26 LAB — URINE CULTURE
MICRO NUMBER:: 10737327
SPECIMEN QUALITY:: ADEQUATE

## 2020-02-29 ENCOUNTER — Other Ambulatory Visit: Payer: Self-pay | Admitting: Family Medicine

## 2020-03-03 ENCOUNTER — Other Ambulatory Visit: Payer: Self-pay | Admitting: Cardiovascular Disease

## 2020-03-20 ENCOUNTER — Other Ambulatory Visit: Payer: Self-pay | Admitting: Family Medicine

## 2020-03-20 MED ORDER — LORAZEPAM 0.5 MG PO TABS
0.5000 mg | ORAL_TABLET | Freq: Two times a day (BID) | ORAL | 0 refills | Status: DC | PRN
Start: 2020-03-20 — End: 2020-04-28

## 2020-03-23 ENCOUNTER — Other Ambulatory Visit: Payer: Self-pay

## 2020-03-24 ENCOUNTER — Other Ambulatory Visit: Payer: Self-pay | Admitting: Family Medicine

## 2020-03-24 ENCOUNTER — Other Ambulatory Visit: Payer: Self-pay

## 2020-03-24 MED ORDER — HYDROCODONE-ACETAMINOPHEN 5-325 MG PO TABS: 1.0000 | ORAL_TABLET | Freq: Four times a day (QID) | ORAL | 0 refills | Status: DC | PRN

## 2020-03-24 NOTE — Telephone Encounter (Signed)
Call placed to patient and patient made aware.  

## 2020-03-24 NOTE — Telephone Encounter (Signed)
If she went through 15 ativan in 4 days that is too much. NTBS>

## 2020-03-24 NOTE — Telephone Encounter (Signed)
Patient states that refill will be due next week, but was trying to be proactive.   Advised to call when refill is due.

## 2020-03-24 NOTE — Telephone Encounter (Signed)
She needs to use this medicine sparingly, not every 8-12 hours.  Can cause worsening confusion.

## 2020-03-24 NOTE — Telephone Encounter (Signed)
Requested Prescriptions   Pending Prescriptions Disp Refills  . LORazepam (ATIVAN) 0.5 MG tablet 15 tablet 0    Sig: Take 1 tablet (0.5 mg total) by mouth 2 (two) times daily as needed for anxiety (do not use with ambien).  Marland Kitchen HYDROcodone-acetaminophen (NORCO) 5-325 MG tablet 30 tablet 0    Sig: Take 1 tablet by mouth every 6 (six) hours as needed for moderate pain.    Last OV 03/20/2020   Last written 02/10/2020

## 2020-03-29 ENCOUNTER — Telehealth: Payer: Self-pay | Admitting: Family Medicine

## 2020-03-29 NOTE — Telephone Encounter (Signed)
Error

## 2020-04-03 ENCOUNTER — Telehealth: Payer: Medicare HMO

## 2020-04-04 ENCOUNTER — Other Ambulatory Visit: Payer: Self-pay | Admitting: Cardiovascular Disease

## 2020-04-05 ENCOUNTER — Other Ambulatory Visit: Payer: Self-pay | Admitting: Family Medicine

## 2020-04-05 MED ORDER — POTASSIUM CHLORIDE ER 10 MEQ PO TBCR: 20.0000 meq | EXTENDED_RELEASE_TABLET | Freq: Every day | ORAL | 0 refills | Status: DC

## 2020-04-06 ENCOUNTER — Other Ambulatory Visit (HOSPITAL_COMMUNITY): Payer: Medicare HMO

## 2020-04-06 ENCOUNTER — Ambulatory Visit: Payer: Medicare HMO | Admitting: Physician Assistant

## 2020-04-06 NOTE — Progress Notes (Deleted)
[facility] [place]                                       Cardiology Office Note    Date:  04/06/2020   ID:  Kelly [name], DOB 03-Mar-1951, MRN [id number]  PCP:  [name], MD  Cardiologist:  [name], MD/Dr. Angelena Form & Dr. Roxy Manns (TAVR)  CC: 1 year s/p TAVR  History of Present Illness:  Kelly [name] is a 69 y.o. 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 Angelena Form in 2018. Echocardiogram performed at that time revealed normal left ventricular systolic function with moderate aortic stenosis. She has been followed regularly ever since. Last year, she reported progressive symptoms of exertional shortness of breath and fatigue with occasional mild dizzy spells. Follow-up echocardiogram revealed moderately severe aortic stenosis with peak velocity across the aortic valve measured 4.65ms corresponding to mean transvalvular gradient estimated 37 mmHg. The DVI was reported 0.31 with aortic valve area calculated 0.9 cm. Left ventricular systolic function remain normal with ejection fraction estimated 60 to 65%. The patient subsequently underwent diagnostic cardiac catheterization on 10/07/18 which confirmed the presence of severe aortic stenosis with mean transvalvular gradient measured 41 mmHg at catheterization. The patient was foundto havetriple vessel CAD 70% pRCA, 95% RPDA, 40% mRCA, 50% post Atrio, 95% pLAD, 80% D1, 99% 2ndMrg, 50% mid-dist LCx. Due to the covid pandemic her work up was delayed. She later [facility] on [facility]  and was set up for TAVR at the end of July.However, this was ultimately canceled after preadmission lab work showed a drop in her hemoglobin to 7.6.She was referred to heme-onc and started on iron infusions with improvement in blood counts.   She underwent successful TAVR with a259mMedtronic Evolut ProTHV via the TF approach on9/15/20. She had transient CHB after valve deployment and new LBBB after TAVR. Post operative echo showed EF >65% and an elevated mean gradient of 24 mm Hg (likley 2/2 vigorous LV function and PPM given large body habitus). She was discharged home on aspirin and plavix as well with as a zio patch which did not show any high grade block.   Today she presents to clinic for follow up.     Past Medical History:  Diagnosis Date  . Anxiety   . Chronic upper GI bleeding    intestinal AVM's, portal gastropathy, esophageal varices on egd 04/2018  . Coronary artery disease involving native coronary artery without angina pectoris   . Dementia ([facility]/07/2017  . Depression   . DIVERTICULITIS OF COLON 06/14/2008   Qualifier: Diagnosis of  By: [name]  . DM (diabetes mellitus), type 2 ([facility]2/06/2017  . Esophageal varices determined by endoscopy ([facility]  . Hyperlipidemia   . Hypertension   . Hypothyroidism   . Incidental pulmonary nodule, greater than or equal to 50m24m/06/2019   LLL nodule needs f/u CT or PET-CT scan  . Irritable bowel syndrome 04/06/2008   Centricity Description: IBS Qualifier: Diagnosis of  ByBobby Rumpf CMA [name]), [name]   Centricity Description: IRRITABLE BOWEL SYNDROME Qualifier: Diagnosis of  By: [name] MD, [name]   . Liver cirrhosis secondary to NASH ([place]) 09/16/2016  . NASH (nonalcoholic steatohepatitis)   . Obesity   . Portal hypertensive gastropathy ([place])   . 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 04/20/19: s/p TAVR wtih a 32m Medtronic CoreValve Evolut Pro via the TF approach  . Severe aortic stenosis   .  Splenomegaly 09/16/2016  . Thrombocytopenia ([place]   . Thyroid disease   . Tobacco abuse   . Vitamin D deficiency     Past Surgical History:  Procedure Laterality Date  . CARDIAC CATHETERIZATION    . COLONOSCOPY WITH PROPOFOL N/A 04/09/2018   Procedure: COLONOSCOPY WITH PROPOFOL;  Surgeon: JMilus Banister MD;  [facility]: [facility];  Service: Endoscopy;  Laterality: N/A;  . CORONARY ANGIOPLASTY    . CORONARY STENT INTERVENTION N/A 02/11/2019   Procedure: CORONARY STENT INTERVENTION;  Surgeon: MBurnell Blanks MD;  [facility];  Service: Cardiovascular;  Laterality: N/A;  Prox RCA Prox LAD  . ESOPHAGOGASTRODUODENOSCOPY (EGD) WITH PROPOFOL N/A 04/09/2018   Procedure: ESOPHAGOGASTRODUODENOSCOPY (EGD) WITH PROPOFOL;  Surgeon: JMilus Banister MD;  [facility]: [facility];  Service: Endoscopy;  Laterality: N/A;  . oopherectomy    . OVARIAN CYST REMOVAL    . POLYPECTOMY  04/09/2018   Procedure: POLYPECTOMY;  Surgeon: JMilus Banister MD;  Location: [facility];  Service: Endoscopy;;  . RIGHT/LEFT HEART CATH AND CORONARY ANGIOGRAPHY N/A 10/07/2018   Procedure: RIGHT/LEFT HEART CATH AND CORONARY ANGIOGRAPHY;  Surgeon: MBurnell Blanks MD;  [facility];  Service: Cardiovascular;  Laterality: N/A;  . TEE WITHOUT CARDIOVERSION N/A 04/20/2019   Procedure: TRANSESOPHAGEAL ECHOCARDIOGRAM (TEE);  Surgeon: MBurnell Blanks MD;  [place] [facility]  Service: Open Heart Surgery;  Laterality: N/A;  . TRANSCATHETER AORTIC VALVE REPLACEMENT, TRANSFEMORAL N/A 04/20/2019   Procedure: TRANSCATHETER AORTIC VALVE REPLACEMENT, TRANSFEMORAL approach using 258mEvolut PRO+ medtronic valve;  Surgeon: McBurnell BlanksMD;  [place] [facility] Service: Open Heart Surgery;  Laterality: N/A;    Current Medications: Outpatient Medications Prior to Visit  Medication Sig Dispense Refill  . ACCU-CHEK AVIVA PLUS test strip     . ACCU-CHEK SOFTCLIX LANCETS lancets     .  amLODipine (NORVASC) 10 MG tablet TAKE 1 TABLET BY MOUTH EVERY DAY 90 tablet 3  . aspirin EC 81 MG tablet Take 1 tablet (81 mg total) by mouth daily. 90 tablet 3  . atorvastatin (LIPITOR) 20 MG tablet TAKE 1 TABLET EVERY DAY 90 tablet 3  . Blood Glucose Monitoring Suppl (ACCU-CHEK AVIVA PLUS) w/Device KIT     . clindamycin (CLEOCIN) 300 MG capsule Take 2 capsules (600 mg) 1 hour prior to all dental visits. 4 capsule 8  . clopidogrel (PLAVIX) 75 MG tablet TAKE 1 TABLET BY MOUTH DAILY. MUST [place] ParkVERDUE FOLLOW UP APPT FOR FURTHER REFILLS [id number] tablet 0  . FEROSUL 325 (65 Fe) MG tablet TAKE 1 TABLET (325 MG TOTAL) BY MOUTH DAILY. 90 tablet 3  . furosemide (LASIX) 20 MG tablet TAKE 1 TABLET EVERY DAY 90 tablet 1  . glipiZIDE (GLUCOTROL XL) 10 MG 24 hr tablet Take 1 tablet (10 mg total) by mouth daily with breakfast. 90 tablet 0  . HYDROcodone-acetaminophen (NORCO) 5-325 MG tablet Take 1 tablet by mouth every 6 (six) hours as needed for moderate pain. 30 tablet 0  .  levothyroxine (SYNTHROID) 75 MCG tablet TAKE 1 TABLET EVERY DAY 90 tablet 3  . LORazepam (ATIVAN) 0.5 MG tablet Take 1 tablet (0.5 mg total) by mouth 2 (two) times daily as needed for anxiety (do not use with ambien). 15 tablet 0  . losartan (COZAAR) 100 MG tablet TAKE 1 TABLET EVERY DAY 90 tablet 1  . metFORMIN (GLUCOPHAGE-XR) 500 MG 24 hr tablet TAKE 2 TABLETS TWICE DAILY 360 tablet 1  . pantoprazole (PROTONIX) 40 MG tablet TAKE 1 TABLET EVERY DAY 90 tablet 3  . pioglitazone (ACTOS) 30 MG tablet Take 1 tablet (30 mg total) by mouth daily. 90 tablet 3  . potassium chloride (KLOR-CON) 10 MEQ tablet Take 2 tablets (20 mEq total) by mouth daily. 180 tablet 0  . sulfamethoxazole-trimethoprim (BACTRIM DS) 800-160 MG tablet Take 1 tablet by mouth 2 (two) times daily. 14 tablet 0  . zolpidem (AMBIEN) 10 MG tablet Take 1 tablet (10 mg total) by mouth at bedtime as needed for sleep. 90 tablet 1   Facility-Administered Medications Prior to  Visit  Medication Dose Route Frequency Provider Last Rate Last Admin  . cyanocobalamin ((VITAMIN B-12)) injection 1,000 mcg  1,000 mcg Subcutaneous Q30 days [name], MD   1,000 mcg at 09/08/17 1408     Allergies:   Prednisone, Celecoxib, Macrodantin [nitrofurantoin macrocrystal], and Penicillins   Social History   Socioeconomic History  . Marital status: Married    Spouse name: Not on file  . Number of children: 1  . Years of education: Not on file  . Highest education level: Not on file  Occupational History  . Not on file  Tobacco Use  . Smoking status: Current Every Day Smoker    Packs/day: 0.50    Types: Cigarettes  . Smokeless tobacco: Never Used  Vaping Use  . Vaping Use: Never used  Substance and Sexual Activity  . Alcohol use: No  . Drug use: Not Currently    Types: Benzodiazepines  . Sexual activity: Not Currently  Other Topics Concern  . Not on file  Social History Narrative  . Not on file   Social Determinants of Health   Financial Resource Strain:   . Difficulty of Paying Living Expenses: Not on file  Food Insecurity:   . Worried About Charity fundraiser in the Last Year: Not on file  . Ran Out of Food in the Last Year: Not on file  Transportation Needs:   . Lack of Transportation (Medical): Not on file  . Lack of Transportation (Non-Medical): Not on file  Physical Activity:   . Days of Exercise per Week: Not on file  . Minutes of Exercise per Session: Not on file  Stress:   . Feeling of Stress : Not on file  Social Connections:   . Frequency of Communication with Friends and Family: Not on file  . Frequency of Social Gatherings with Friends and Family: Not on file  . Attends Religious Services: Not on file  . Active Member of Clubs or Organizations: Not on file  . Attends Archivist Meetings: Not on file  . Marital Status: Not on file     Family History:  The patient's family history includes Alcohol abuse in her father;  Alzheimer's disease in her mother; Dementia in her mother; Heart disease in her father; Lung cancer in her father; Other in her mother.     ROS:   Please see the history of present illness.    ROS All  other systems reviewed and are negative.   PHYSICAL EXAM:   VS:  There were no vitals taken for this visit.   GEN: Well nourished, well developed, in no acute distress, obese HEENT: normal Neck: no JVD or masses Cardiac: RRR; soft flow murmur. No rubs, or gallops,no edema  Respiratory:  clear to auscultation bilaterally, normal work of breathing GI: soft, nontender, nondistended, + BS MS: no deformity or atrophy Skin: warm and dry, no rash Neuro:  Alert and Oriented x 3, Strength and sensation are intact Psych: euthymic mood, full affect   Wt Readings from Last 3 Encounters:  02/24/20 (!) 210 lb (95.3 kg)  01/24/20 210 lb (95.3 kg)  12/17/19 206 lb (93.4 kg)      Studies/Labs Reviewed:   EKG:  EKG is NOT ordered today.    Recent Labs: 04/16/2019: B Natriuretic Peptide 132.0 04/21/2019: Magnesium 1.7 01/24/2020: ALT 12; BUN 13; Creat 0.95; Hemoglobin 12.7; Platelets 97; Potassium 4.4; Sodium 141   Lipid Panel    Component Value Date/Time   CHOL 164 01/24/2020 1434   TRIG 133 01/24/2020 1434   HDL 53 01/24/2020 1434   CHOLHDL 3.1 01/24/2020 1434   VLDL 20 03/26/2017 0908   LDLCALC 88 01/24/2020 1434    Additional studies/ records that were reviewed today include:  TAVR OPERATIVE NOTE   Date of Procedure:04/20/2019  Preoperative Diagnosis:Severe Aortic Stenosis   Postoperative Diagnosis:Same   Procedure:   Transcatheter Aortic Valve Replacement - PercutaneousRightTransfemoral Approach Medtronic CoreValve Evolut Pro (size 54m, serial # [id number]  Co-Surgeons:Christopher MAngelena Form MD andClarence H. ORoxy Manns MD  Anesthesiologist:Adam HMarcie Bal  MD  Echocardiographer:Peter NJohnsie Cancel MD  Pre-operative Echo Findings: ? Severe aortic stenosis ? Normalleft ventricular systolic function  Post-operative Echo Findings: ? Noparavalvular leak ? Normalleft ventricular systolic function  __________________   Echo 04/21/19: IMPRESSIONS 1. The left ventricle has hyperdynamic systolic function, with an ejection fraction of >65%. The cavity size was normal. There is moderately increased left ventricular wall thickness. Left ventricular diastolic Doppler parameters are consistent with  impaired relaxation. 2. The right ventricle has normal systolic function. The cavity was normal. There is No increase in right ventricular wall thickness. 3. Left atrial size was normal. 4. Right atrial size was normal. 5. No evidence of mitral valve stenosis. 6. A 292ma Medtronic CoreValve-Evolut Pro bioprosthetic aortic valve (TAVR) valve is present in the aortic position. 7. Aortic valve regurgitation was not visualized by color flow Doppler. Moderate stenosis of the aortic valve. 8. Pulmonic valve regurgitation is not visualized by color flow Doppler. 9. The aorta is normal unless otherwise noted.  __________________   Echo 05/13/19 IMPRESSIONS  1. Left ventricular ejection fraction, by visual estimation, is 60 to 65%. The left ventricle has normal function. Normal left ventricular size. There is moderately increased left ventricular hypertrophy. Normal wall motion.  2. Left ventricular diastolic Doppler parameters are consistent with impaired relaxation pattern of LV diastolic filling.  3. The tricuspid valve is normal in structure. Tricuspid valve regurgitation is mild.  4. The mitral valve is normal in structure. Trace mitral valve regurgitation. No evidence of mitral stenosis.  5. Global right ventricle has normal systolic function.The right ventricular size is normal. No increase in right ventricular wall  thickness.  6. Bioprosthetic aortic valve s/p TAVR. Trivial peri-valvular leak. Mean gradient 14 mmHg with AVA 1.6 cm^2.  7. Mildly dilated left atrium.  8. Right atrial size was normal.  9. The inferior vena cava is normal in size with greater than 50%  respiratory variability, suggesting right atrial pressure of 3 mmHg. 10. The tricuspid regurgitant velocity is 2.65 m/s, and with an assumed right atrial pressure of 3 mmHg, the estimated right ventricular systolic pressure is mildly elevated at 31.1 mmHg. 11. Trivial pericardial effusion is present.  _________________  Echo 04/06/20 ***  ASSESSMENT & PLAN:   Severe AS s/p TAVR: echo today shows EF 60-65%, normally functioning TAVR with mean gradient of 14 mm Hg and trivial PVL. She has NYHA class II symptoms. SBE prophylaxis discussed; she has clindamycin. This will be switched to azithromycin given high risk of C. Diff with clinda. Stop plavix now  CAD: s/pPCI/DES to [facility] and PCI/DES to mLAD on 02/11/19. Contnue Aspirin alone.   HTN: BP well controlled today.   Chronic disatolic CHF: appears euvolemic. Continue lasix 74m daily  Pulmonary nodule: pre TAVR scans showed a 10 x 11 mm left lower lobe pulmonary nodule, new compared to remote prior study from 2009. Follow up PET-CT 05/10/19 was reassuring but will require follow up over time. I have sent the report to Dr. PDennard Schaumannfor review and to follow over time.  Tobacco abuse: chantix Rx'd last visit but it was prohibitively expensive. She is now smoking but not inhaling.  Medication Adjustments/Labs and Tests Ordered: Current medicines are reviewed at length with the patient today.  Concerns regarding medicines are outlined above.  Medication changes, Labs and Tests ordered today are listed in the Patient Instructions below. There are no Patient Instructions on file for this visit.   SMable Fill PA-C  04/06/2020 11:06 AM    [facility] [address] [place] [employer]  [id number]: [phone] Fax: [phone]

## 2020-04-11 ENCOUNTER — Telehealth (HOSPITAL_COMMUNITY): Payer: Self-pay | Admitting: Physician Assistant

## 2020-04-11 NOTE — Telephone Encounter (Signed)
Patient called and cancelled echocardiogram for 04/06/20 and states she will call later to reschedule. Order will be removed from the [place] and if patient calls back we will reinstate the order. Thank you.

## 2020-04-18 ENCOUNTER — Other Ambulatory Visit: Payer: Self-pay

## 2020-04-18 DIAGNOSIS — I350 Nonrheumatic aortic (valve) stenosis: Secondary | ICD-10-CM

## 2020-04-18 DIAGNOSIS — Z952 Presence of prosthetic heart valve: Secondary | ICD-10-CM

## 2020-04-28 ENCOUNTER — Other Ambulatory Visit: Payer: Self-pay

## 2020-04-28 ENCOUNTER — Ambulatory Visit (INDEPENDENT_AMBULATORY_CARE_PROVIDER_SITE_OTHER): Payer: Medicare HMO | Admitting: Family Medicine

## 2020-04-28 VITALS — BP 130/80 | HR 61 | Temp 97.9°F | Ht 64.0 in | Wt 213.0 lb

## 2020-04-28 DIAGNOSIS — I8500 Esophageal varices without bleeding: Secondary | ICD-10-CM | POA: Diagnosis not present

## 2020-04-28 DIAGNOSIS — I2510 Atherosclerotic heart disease of native coronary artery without angina pectoris: Secondary | ICD-10-CM | POA: Diagnosis not present

## 2020-04-28 DIAGNOSIS — Z952 Presence of prosthetic heart valve: Secondary | ICD-10-CM | POA: Diagnosis not present

## 2020-04-28 DIAGNOSIS — Z23 Encounter for immunization: Secondary | ICD-10-CM

## 2020-04-28 DIAGNOSIS — E038 Other specified hypothyroidism: Secondary | ICD-10-CM

## 2020-04-28 DIAGNOSIS — F5101 Primary insomnia: Secondary | ICD-10-CM | POA: Diagnosis not present

## 2020-04-28 DIAGNOSIS — E1169 Type 2 diabetes mellitus with other specified complication: Secondary | ICD-10-CM

## 2020-04-28 DIAGNOSIS — D508 Other iron deficiency anemias: Secondary | ICD-10-CM | POA: Diagnosis not present

## 2020-04-28 DIAGNOSIS — E119 Type 2 diabetes mellitus without complications: Secondary | ICD-10-CM | POA: Diagnosis not present

## 2020-04-28 MED ORDER — HYDROCODONE-ACETAMINOPHEN 5-325 MG PO TABS
1.0000 | ORAL_TABLET | Freq: Four times a day (QID) | ORAL | 0 refills | Status: DC | PRN
Start: 2020-04-28 — End: 2020-05-12

## 2020-04-28 NOTE — Progress Notes (Signed)
Subjective:    Patient ID: Kelly [name], female    DOB: March 26, 1951, 69 y.o.   MRN: [id number]  HPI  PMH significant for coronary artery stenting for coronary artery disease.  She also had aortic valve replacement via transcatheter approach due to her severe aortic stenosis.  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  In March, A1c was 10.2 and I recommended adding jardiance and glipizide.  Could not afford jardiance so we had to use actos.    When I last saw the patient in June, her hemoglobin A1c was 5.9!.  She is here today for follow-up.  She denies any chest pain.  She denies any shortness of breath.  She denies any dyspnea on exertion.  She denies any polyuria or polydipsia.  She denies any neuropathy in her feet.  She does have mild underlying memory issues.  I am concerned because she takes hydrocodone occasionally.  I initially gave her the medication for pain in her back.  She states the pain in her back is still present however it is much better.  She is now using the hydrocodone occasionally for dental pain.  She states that she uses a half a tablet once a day perhaps if that.  She is no longer using lorazepam.  She is using Ambien at night to help her sleep.  I cautioned the patient that I am concerned that any centrally acting medication [place] exacerbate memory loss, cause confusion, and lead to falls.  I have recommended that we try to use less of this medication and take a half a pill a day rather than a whole pill.  She denies any melena or hematochezia.  She has a history of esophageal varices and GI bleeding due to intestinal AVMs.  She denies seeing any blood loss in her stool.  She is still on aspirin and Plavix secondary to her history of underlying coronary artery disease requiring stents. Past Medical History:  Diagnosis Date  . Anxiety   . Chronic upper GI bleeding    intestinal AVM's, portal gastropathy, esophageal varices on egd 04/2018  .  Coronary artery disease involving native coronary artery without angina pectoris   . Dementia ([place]) 09/16/2016  . Depression   . DIVERTICULITIS OF COLON 06/14/2008   Qualifier: Diagnosis of  By: [name]    . DM (diabetes mellitus), type 2 ([place]) 09/15/2016  . Esophageal varices determined by endoscopy ([place])   . Hyperlipidemia   . Hypertension   . Hypothyroidism   . Incidental pulmonary nodule, greater than or equal to 74m 10/14/2018   LLL nodule needs f/u CT or PET-CT scan  . Irritable bowel syndrome 04/06/2008   Centricity Description: IBS Qualifier: Diagnosis of  By: [name] (ADeborra Medina, [name]   Centricity Description: IRRITABLE BOWEL SYNDROME Qualifier: Diagnosis of  By: [name], [name]  . Liver cirrhosis secondary to NASH ([place] 09/16/2016  . NASH (nonalcoholic steatohepatitis)   . Obesity   . Portal hypertensive gastropathy ([place]   . 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 04/20/19: s/p TAVR wtih a 226mMedtronic CoreValve Evolut Pro via the TF approach  . Severe aortic stenosis   . Splenomegaly 09/16/2016  . Thrombocytopenia ([place]  . Thyroid disease   . Tobacco abuse   . Vitamin D deficiency    Past Surgical History:  Procedure Laterality Date  . CARDIAC CATHETERIZATION    . COLONOSCOPY WITH PROPOFOL N/A  04/09/2018   Procedure: COLONOSCOPY WITH PROPOFOL;  Surgeon: [name], MD;  Location: [facility];  Service: Endoscopy;  Laterality: N/A;  . CORONARY ANGIOPLASTY    . CORONARY STENT INTERVENTION N/A 02/11/2019   Procedure: CORONARY STENT INTERVENTION;  Surgeon: [name], MD;  Location: [facility];  Service: Cardiovascular;  Laterality: N/A;  Prox RCA Prox LAD  . ESOPHAGOGASTRODUODENOSCOPY (EGD) WITH PROPOFOL N/A 04/09/2018   Procedure: ESOPHAGOGASTRODUODENOSCOPY (EGD) WITH PROPOFOL;  Surgeon: [name], MD;  Location: [facility];  Service: Endoscopy;  Laterality: N/A;  . oopherectomy    . OVARIAN CYST REMOVAL     . POLYPECTOMY  04/09/2018   Procedure: POLYPECTOMY;  Surgeon: [name], MD;  Location: [facility];  Service: Endoscopy;;  . RIGHT/LEFT HEART CATH AND CORONARY ANGIOGRAPHY N/A 10/07/2018   Procedure: RIGHT/LEFT HEART CATH AND CORONARY ANGIOGRAPHY;  Surgeon: [name], MD;  Location: [facility];  Service: Cardiovascular;  Laterality: N/A;  . TEE WITHOUT CARDIOVERSION N/A 04/20/2019   Procedure: TRANSESOPHAGEAL ECHOCARDIOGRAM (TEE);  Surgeon: [name], MD;  Location: [facility];  Service: Open Heart Surgery;  Laterality: N/A;  . TRANSCATHETER AORTIC VALVE REPLACEMENT, TRANSFEMORAL N/A 04/20/2019   Procedure: TRANSCATHETER AORTIC VALVE REPLACEMENT, TRANSFEMORAL approach using 51m Evolut PRO+ medtronic valve;  Surgeon: MBurnell Blanks MD;  [facility]  Service: Open Heart Surgery;  Laterality: N/A;   Current Outpatient Medications on File Prior to Visit  Medication Sig Dispense Refill  . ACCU-CHEK AVIVA PLUS test strip     . ACCU-CHEK SOFTCLIX LANCETS lancets     . amLODipine (NORVASC) 10 MG tablet TAKE 1 TABLET BY MOUTH EVERY DAY 90 tablet 3  . aspirin EC 81 MG tablet Take 1 tablet (81 mg total) by mouth daily. 90 tablet 3  . atorvastatin (LIPITOR) 20 MG tablet TAKE 1 TABLET EVERY DAY 90 tablet 3  . Blood Glucose Monitoring Suppl (ACCU-CHEK AVIVA PLUS) w/Device KIT     . clindamycin (CLEOCIN) 300 MG capsule Take 2 capsules (600 mg) 1 hour prior to all dental visits. 4 capsule 8  . clopidogrel (PLAVIX) 75 MG tablet TAKE 1 TABLET BY MOUTH DAILY. MUST SBren[place]OVERDUE FOLLOW UP APPT FOR FURTHER REFILLS [id number] tablet 0  . FEROSUL 325 (65 Fe) MG tablet TAKE 1 TABLET (325 MG TOTAL) BY MOUTH DAILY. 90 tablet 3  . furosemide (LASIX) 20 MG tablet TAKE 1 TABLET EVERY DAY 90 tablet 1  . glipiZIDE (GLUCOTROL XL) 10 MG 24 hr tablet Take 1 tablet (10 mg total) by mouth daily with breakfast. 90 tablet 0  . HYDROcodone-acetaminophen (NORCO) 5-325 MG tablet  Take 1 tablet by mouth every 6 (six) hours as needed for moderate pain. 30 tablet 0  . levothyroxine (SYNTHROID) 75 MCG tablet TAKE 1 TABLET EVERY DAY 90 tablet 3  . LORazepam (ATIVAN) 0.5 MG tablet Take 1 tablet (0.5 mg total) by mouth 2 (two) times daily as needed for anxiety (do not use with ambien). 15 tablet 0  . losartan (COZAAR) 100 MG tablet TAKE 1 TABLET EVERY DAY 90 tablet 1  . metFORMIN (GLUCOPHAGE-XR) 500 MG 24 hr tablet TAKE 2 TABLETS TWICE DAILY 360 tablet 1  . pantoprazole (PROTONIX) 40 MG tablet TAKE 1 TABLET EVERY DAY 90 tablet 3  . pioglitazone (ACTOS) 30 MG tablet Take 1 tablet (30 mg total) by mouth daily. 90 tablet 3  . potassium chloride (KLOR-CON) 10 MEQ tablet Take 2 tablets (20 mEq total) by mouth  daily. 180 tablet 0  . sulfamethoxazole-trimethoprim (BACTRIM DS) 800-160 MG tablet Take 1 tablet by mouth 2 (two) times daily. 14 tablet 0  . zolpidem (AMBIEN) 10 MG tablet Take 1 tablet (10 mg total) by mouth at bedtime as needed for sleep. 90 tablet 1   Current Facility-Administered Medications on File Prior to Visit  Medication Dose Route Frequency Provider Last Rate Last Admin  . cyanocobalamin ((VITAMIN B-12)) injection 1,000 mcg  1,000 mcg Subcutaneous Q30 days [name], MD   1,000 mcg at 09/08/17 1408   Allergies  Allergen Reactions  . Prednisone Itching    Heart rate increased  . Celecoxib Rash  . Macrodantin [Nitrofurantoin Macrocrystal] Rash  . Penicillins Hives    Did it involve swelling of the face/tongue/throat, SOB, or low BP? No Did it involve sudden or severe rash/hives, skin peeling, or any reaction on the inside of your mouth or nose? No Did you need to seek medical attention at a hospital or doctor's office? No When did it last happen?20 + years If all above answers are "NO", [place] proceed with cephalosporin use.    Social History   Socioeconomic History  . Marital status: Married    Spouse name: Not on file  . Number of children: 1   . Years of education: Not on file  . Highest education level: Not on file  Occupational History  . Not on file  Tobacco Use  . Smoking status: Current Every Day Smoker    Packs/day: 0.50    Types: Cigarettes  . Smokeless tobacco: Never Used  Vaping Use  . Vaping Use: Never used  Substance and Sexual Activity  . Alcohol use: No  . Drug use: Not Currently    Types: Benzodiazepines  . Sexual activity: Not Currently  Other Topics Concern  . Not on file  Social History Narrative  . Not on file   Social Determinants of Health   Financial Resource Strain:   . Difficulty of Paying Living Expenses: Not on file  Food Insecurity:   . Worried About Charity fundraiser in the Last Year: Not on file  . Ran Out of Food in the Last Year: Not on file  Transportation Needs:   . Lack of Transportation (Medical): Not on file  . Lack of Transportation (Non-Medical): Not on file  Physical Activity:   . Days of Exercise per Week: Not on file  . Minutes of Exercise per Session: Not on file  Stress:   . Feeling of Stress : Not on file  Social Connections:   . Frequency of Communication with Friends and Family: Not on file  . Frequency of Social Gatherings with Friends and Family: Not on file  . Attends Religious Services: Not on file  . Active Member of Clubs or Organizations: Not on file  . Attends Archivist Meetings: Not on file  . Marital Status: Not on file  Intimate Partner Violence:   . Fear of Current or Ex-Partner: Not on file  . Emotionally Abused: Not on file  . Physically Abused: Not on file  . Sexually Abused: Not on file       Review of Systems  All other systems reviewed and are negative.      Objective:   Physical Exam Vitals reviewed.  Constitutional:      General: She is not in acute distress.    Appearance: She is obese. She is not ill-appearing, toxic-appearing or diaphoretic.  Cardiovascular:  Rate and Rhythm: Normal rate and regular rhythm.      Heart sounds: Murmur heard.   Pulmonary:     Effort: Pulmonary effort is normal. No respiratory distress.     Breath sounds: Normal breath sounds. No wheezing, rhonchi or rales.  Abdominal:     General: Bowel sounds are normal. There is no distension.     Palpations: Abdomen is soft.     Tenderness: There is no abdominal tenderness. There is no guarding.  Musculoskeletal:     Right lower leg: No edema.     Left lower leg: No edema.  Neurological:     Mental Status: She is alert.      Coronary artery disease involving native coronary artery of native heart without angina pectoris - Plan: CBC with Differential/Platelet, COMPLETE METABOLIC PANEL WITH GFR, Lipid panel, Hemoglobin A1c  Type 2 diabetes mellitus with other specified complication, without long-term current use of insulin ([place]) - Plan: CBC with Differential/Platelet, COMPLETE METABOLIC PANEL WITH GFR, Lipid panel, Hemoglobin A1c  Other specified hypothyroidism - Plan: TSH  S/P TAVR (transcatheter aortic valve replacement)  Other iron deficiency anemia  Primary insomnia  Esophageal varices determined by endoscopy ([place])  Overall the patient seems to be doing extremely well.  Check a CBC to monitor for any blood loss due to her dual antiplatelet agents and her known underlying esophageal varices.  Check a CMP to monitor her liver and kidney function.  Given her history of coronary artery disease, check an lipid panel.  Goal LDL cholesterol is less than 70.  Check an A1c.  Goal A1c is less than 7.  If she is still less than 6 I will discontinue glipizide.  Given her history of hypothyroidism, I will check a TSH to ensure adequate dosage of levothyroxine.  I have recommended that she try to reduce her Ambien to half a tablet at night and limit her use is much as possible.  I will remove lorazepam from her medication list as she states that she is not taking that any longer.  Also caution the patient to try to use pain  medication as infrequently as possible due to the risk of sedation and falls and confusion.  Patient received her flu shot today.  Recommended a booster on her Covid vaccine     Assessment & Plan:

## 2020-04-29 LAB — CBC WITH DIFFERENTIAL/PLATELET
Absolute Monocytes: 422 cells/uL (ref 200–950)
Basophils Absolute: 22 cells/uL (ref 0–200)
Basophils Relative: 0.5 %
Eosinophils Absolute: 123 cells/uL (ref 15–500)
Eosinophils Relative: 2.8 %
HCT: 36.7 % (ref 35.0–45.0)
Hemoglobin: 12.4 g/dL (ref 11.7–15.5)
Lymphs Abs: 1320 cells/uL (ref 850–3900)
MCH: 30 pg (ref 27.0–33.0)
MCHC: 33.8 g/dL (ref 32.0–36.0)
MCV: 88.9 fL (ref 80.0–100.0)
MPV: 11.9 fL (ref 7.5–12.5)
Monocytes Relative: 9.6 %
Neutro Abs: 2512 cells/uL (ref 1500–7800)
Neutrophils Relative %: 57.1 %
Platelets: 59 10*3/uL — ABNORMAL LOW (ref 140–400)
RBC: 4.13 10*6/uL (ref 3.80–5.10)
RDW: 14.7 % (ref 11.0–15.0)
Total Lymphocyte: 30 %
WBC: 4.4 10*3/uL (ref 3.8–10.8)

## 2020-04-29 LAB — LIPID PANEL
Cholesterol: 147 mg/dL (ref <200)
HDL: 47 mg/dL — ABNORMAL LOW (ref >=50)
LDL Cholesterol (Calc): 76 mg/dL (calc)
Non-HDL Cholesterol (Calc): 100 mg/dL (calc) (ref <130)
Total CHOL/HDL Ratio: 3.1 (calc) (ref <5.0)
Triglycerides: 139 mg/dL (ref <150)

## 2020-04-29 LAB — COMPLETE METABOLIC PANEL WITH GFR
AG Ratio: 1.5 (calc) (ref 1.0–2.5)
ALT: 11 U/L (ref 6–29)
AST: 20 U/L (ref 10–35)
Albumin: 3.9 g/dL (ref 3.6–5.1)
Alkaline phosphatase (APISO): 76 U/L (ref 37–153)
BUN: 13 mg/dL (ref 7–25)
CO2: 21 mmol/L (ref 20–32)
Calcium: 9.4 mg/dL (ref 8.6–10.4)
Chloride: 108 mmol/L (ref 98–110)
Creat: 0.84 mg/dL (ref 0.60–0.93)
GFR, Est African American: 81 mL/min/{1.73_m2} (ref >=60)
GFR, Est Non African American: 70 mL/min/{1.73_m2} (ref >=60)
Globulin: 2.6 g/dL (calc) (ref 1.9–3.7)
Glucose, Bld: 98 mg/dL (ref 65–99)
Potassium: 3.6 mmol/L (ref 3.5–5.3)
Sodium: 140 mmol/L (ref 135–146)
Total Bilirubin: 1.1 mg/dL (ref 0.2–1.2)
Total Protein: 6.5 g/dL (ref 6.1–8.1)

## 2020-04-29 LAB — HEMOGLOBIN A1C
Hgb A1c MFr Bld: 4.8 % of total Hgb (ref <5.7)
Mean Plasma Glucose: 91 (calc)
eAG (mmol/L): 5 (calc)

## 2020-04-29 LAB — TSH: TSH: 4.01 mIU/L (ref 0.40–4.50)

## 2020-05-01 ENCOUNTER — Other Ambulatory Visit: Payer: Self-pay

## 2020-05-01 DIAGNOSIS — K7460 Unspecified cirrhosis of liver: Secondary | ICD-10-CM

## 2020-05-03 ENCOUNTER — Other Ambulatory Visit (HOSPITAL_COMMUNITY): Payer: Medicare HMO

## 2020-05-03 ENCOUNTER — Telehealth (HOSPITAL_COMMUNITY): Payer: Self-pay | Admitting: Physician Assistant

## 2020-05-03 ENCOUNTER — Ambulatory Visit: Payer: Medicare HMO | Admitting: Physician Assistant

## 2020-05-03 NOTE — Telephone Encounter (Signed)
Patient called and cancelled echocardiogram for 05/03/2020 due to she was sick and will call back to reschedule. Order will be removed from the [facility] and when patient calls back to reschedule we will reinstate the order.

## 2020-05-09 ENCOUNTER — Telehealth: Payer: Self-pay | Admitting: Oncology

## 2020-05-09 NOTE — Telephone Encounter (Signed)
Received a new hem referral from Dr. [name] for Liver cirrhosis secondary to NASH. Kelly [name] has been cld and scheduled to see dr. Alen Blew on 10/28 at 11am.

## 2020-05-11 ENCOUNTER — Telehealth: Payer: Self-pay

## 2020-05-11 NOTE — Telephone Encounter (Signed)
Pt needs to talk to Dr [name] about abscessed on tooth. Pt now has dental insurance and has to go to specialist can not be seen until 10/25 that is only for a consultation. Pt will need to find a regular dentist before she sees them. Pt concern since the tooth has got worse she did not know if she needs to get a antibiotic will help her. It is getting worse and she does not have a regular dentist to get to. Also her pain meds run out before she can see a doctor.   Pt call back [phone]

## 2020-05-11 NOTE — Telephone Encounter (Signed)
Please advise.  Ok to refill pain medications?

## 2020-05-12 ENCOUNTER — Other Ambulatory Visit: Payer: Self-pay | Admitting: Family Medicine

## 2020-05-12 MED ORDER — CLINDAMYCIN HCL 300 MG PO CAPS: 300.0000 mg | ORAL_CAPSULE | Freq: Three times a day (TID) | ORAL | 0 refills | Status: DC

## 2020-05-12 MED ORDER — AMOXICILLIN-POT CLAVULANATE 875-125 MG PO TABS: 1.0000 | ORAL_TABLET | Freq: Two times a day (BID) | ORAL | 0 refills | Status: DC

## 2020-05-12 MED ORDER — HYDROCODONE-ACETAMINOPHEN 5-325 MG PO TABS
1.0000 | ORAL_TABLET | Freq: Four times a day (QID) | ORAL | 0 refills | Status: DC | PRN
Start: 2020-05-12 — End: 2020-07-05

## 2020-05-12 NOTE — Telephone Encounter (Signed)
Ok to refill??  Last office visit 04/28/2020.  Last refill 11/12/2019.

## 2020-05-12 NOTE — Telephone Encounter (Signed)
Recommend amoxicillin 875 mg pobid for 10 days.  I will refill pain meds.

## 2020-05-12 NOTE — Telephone Encounter (Signed)
Received fax requesting a refill on [facility]pidem [facility]m tab[facility]

## 2020-05-12 NOTE — Telephone Encounter (Signed)
Prescription sent to pharmacy.   Call placed to patient and patient made aware.

## 2020-05-15 MED ORDER — ZOLPIDEM TARTRATE 10 MG PO TABS: 10.0000 mg | ORAL_TABLET | Freq: Every evening | ORAL | 1 refills | Status: DC | PRN

## 2020-05-16 ENCOUNTER — Telehealth: Payer: Self-pay

## 2020-05-16 NOTE — Telephone Encounter (Signed)
Pt needs refill on zolpidem (AMBIEN) 10 MG tablet  Sent to [facility] fax is [phone]  Call back [phone]

## 2020-05-17 NOTE — Telephone Encounter (Signed)
Prescription sent to pharmacy on 05/15/2020.

## 2020-06-01 ENCOUNTER — Inpatient Hospital Stay: Payer: Medicare HMO | Attending: Oncology | Admitting: Oncology

## 2020-06-01 ENCOUNTER — Other Ambulatory Visit: Payer: Self-pay

## 2020-06-01 VITALS — BP 168/84 | HR 68 | Temp 97.2°F | Resp 19 | Ht 64.0 in | Wt 206.4 lb

## 2020-06-01 DIAGNOSIS — D508 Other iron deficiency anemias: Secondary | ICD-10-CM | POA: Insufficient documentation

## 2020-06-01 DIAGNOSIS — D509 Iron deficiency anemia, unspecified: Secondary | ICD-10-CM | POA: Diagnosis not present

## 2020-06-01 DIAGNOSIS — K909 Intestinal malabsorption, unspecified: Secondary | ICD-10-CM | POA: Diagnosis not present

## 2020-06-01 DIAGNOSIS — Z79899 Other long term (current) drug therapy: Secondary | ICD-10-CM | POA: Diagnosis not present

## 2020-06-01 DIAGNOSIS — K7460 Unspecified cirrhosis of liver: Secondary | ICD-10-CM | POA: Diagnosis not present

## 2020-06-01 DIAGNOSIS — Z7984 Long term (current) use of oral hypoglycemic drugs: Secondary | ICD-10-CM | POA: Diagnosis not present

## 2020-06-01 DIAGNOSIS — R5383 Other fatigue: Secondary | ICD-10-CM | POA: Diagnosis not present

## 2020-06-01 DIAGNOSIS — D6959 Other secondary thrombocytopenia: Secondary | ICD-10-CM | POA: Insufficient documentation

## 2020-06-01 DIAGNOSIS — Z7982 Long term (current) use of aspirin: Secondary | ICD-10-CM | POA: Diagnosis not present

## 2020-06-01 NOTE — Progress Notes (Signed)
Hematology and Oncology Follow up   Kelly [name] [id number] [birth year] 69 y.o. 06/01/2020 11:01 AM Kelly [name], MDPickard, Kelly Mcgee, MD       Principle Diagnosis: 69 year old woman with:   1. Iron deficiency anemia related to poor iron absorption and chronic blood losses diagnosed in July 2019.    2.  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   Prior Therapy: IV iron replacement completed August 2019 utilizing 1000 mg of Feraheme.  Current therapy: Active surveillance.  Interim History: [name] presents today for return evaluation.  She is a 69 year old woman known to me with the above diagnoses.  Since last visit, she reports no major changes in her health.  He does report some mild fatigue and tiredness but not dramatically changed as of late.  She reports taking oral iron replacement intermittently without any active bleeding.  She denies any hematochezia, melena or hemoptysis.  She does report occasional discoloration or color of her stool with iron replacement.   Medications: Updated on review. Current Outpatient Medications  Medication Sig Dispense Refill  . ACCU-CHEK AVIVA PLUS test strip     . ACCU-CHEK SOFTCLIX LANCETS lancets     . amLODipine (NORVASC) 10 MG tablet TAKE 1 TABLET BY MOUTH EVERY DAY 90 tablet 3  . amoxicillin-clavulanate (AUGMENTIN) 875-125 MG tablet Take 1 tablet by mouth 2 (two) times daily. 20 tablet 0  . aspirin EC 81 MG tablet Take 1 tablet (81 mg total) by mouth daily. 90 tablet 3  . atorvastatin (LIPITOR) 20 MG tablet TAKE 1 TABLET EVERY DAY 90 tablet 3  . Blood Glucose Monitoring Suppl (ACCU-CHEK AVIVA PLUS) w/Device KIT     . clopidogrel (PLAVIX) 75 MG tablet TAKE 1 TABLET BY MOUTH DAILY. MUST [place] OVERDUE FOLLOW UP APPT FOR FURTHER REFILLS [phone] 90 tablet 0  . FEROSUL 325 (65 Fe) MG tablet TAKE 1 TABLET (325 MG TOTAL) BY MOUTH DAILY. 90 tablet 3  . furosemide (LASIX) 20 MG tablet TAKE 1 TABLET EVERY DAY 90  tablet 1  . glipiZIDE (GLUCOTROL XL) 10 MG 24 hr tablet Take 1 tablet (10 mg total) by mouth daily with breakfast. 90 tablet 0  . HYDROcodone-acetaminophen (NORCO) 5-325 MG tablet Take 1 tablet by mouth every 6 (six) hours as needed for moderate pain. 30 tablet 0  . levothyroxine (SYNTHROID) 75 MCG tablet TAKE 1 TABLET EVERY DAY 90 tablet 3  . losartan (COZAAR) 100 MG tablet TAKE 1 TABLET EVERY DAY 90 tablet 1  . metFORMIN (GLUCOPHAGE-XR) 500 MG 24 hr tablet TAKE 2 TABLETS TWICE DAILY 360 tablet 1  . pantoprazole (PROTONIX) 40 MG tablet TAKE 1 TABLET EVERY DAY 90 tablet 3  . pioglitazone (ACTOS) 30 MG tablet Take 1 tablet (30 mg total) by mouth daily. 90 tablet 3  . potassium chloride (KLOR-CON) 10 MEQ tablet Take 2 tablets (20 mEq total) by mouth daily. 180 tablet 0  . zolpidem (AMBIEN) 10 MG tablet Take 1 tablet (10 mg total) by mouth at bedtime as needed for sleep. 90 tablet 1   Current Facility-Administered Medications  Medication Dose Route Frequency Provider Last Rate Last Admin  . cyanocobalamin ((VITAMIN B-12)) injection 1,000 mcg  1,000 mcg Subcutaneous Q30 days Kelly Frizzle, MD   1,000 mcg at 09/08/17 1408     Allergies:  Allergies  Allergen Reactions  . Prednisone Itching    Heart rate increased  . Celecoxib Rash  . Macrodantin [Nitrofurantoin Macrocrystal] Rash  . Penicillins Hives  Did it involve swelling of the face/tongue/throat, SOB, or low BP? No Did it involve sudden or severe rash/hives, skin peeling, or any reaction on the inside of your mouth or nose? No Did you need to seek medical attention at a hospital or doctor's office? No When did it last happen?20 + years If all above answers are "NO", [place] proceed with cephalosporin use.     Physical exam: Blood pressure (!) 168/84, pulse 68, temperature (!) 97.2 F (36.2 C), temperature source Tympanic, resp. rate 19, height 5' 4"  (1.626 m), weight 206 lb 6.4 oz (93.6 kg), SpO2 99 %.   ECOG 0  General  appearance: Comfortable appearing without any discomfort Head: Normocephalic without any trauma Oropharynx: Mucous membranes are moist and pink without any thrush or ulcers. Eyes: Pupils are equal and round reactive to light. Lymph nodes: No cervical, supraclavicular, inguinal or axillary lymphadenopathy.   Heart:regular rate and rhythm.  S1 and S2 without leg edema. Lung: Clear without any rhonchi or wheezes.  No dullness to percussion. Abdomin: Soft, nontender, nondistended with good bowel sounds.  No hepatosplenomegaly. Musculoskeletal: No joint deformity or effusion.  Full range of motion noted. Neurological: No deficits noted on motor, sensory and deep tendon reflex exam. Skin: No petechial rash or dryness.  Appeared moist.       Lab Results: Lab Results  Component Value Date   WBC 4.4 04/28/2020   HGB 12.4 04/28/2020   HCT 36.7 04/28/2020   MCV 88.9 04/28/2020   PLT 59 (L) 04/28/2020     Chemistry      Component Value Date/Time   NA 140 04/28/2020 1254   NA 141 04/28/2019 1448   K 3.6 04/28/2020 1254   CL 108 04/28/2020 1254   CO2 21 04/28/2020 1254   BUN 13 04/28/2020 1254   BUN 8 04/28/2019 1448   CREATININE 0.84 04/28/2020 1254      Component Value Date/Time   CALCIUM 9.4 04/28/2020 1254   ALKPHOS 113 04/16/2019 1215   AST 20 04/28/2020 1254   ALT 11 04/28/2020 1254   BILITOT 1.1 04/28/2020 1254       Impression and Plan:   69 year old woman with the following:  1.  Anemia related to iron deficiency and poor iron absorption diagnosed in 2019.  He is status post intravenous iron infusion with normalization of her counts since that time.  Laboratory data in the last year noted and showed normal hemoglobin at this time.  Her hemoglobin on September 24 was 12.4 with normal MCV.  No additional intervention is needed at this time.  I recommended repeating iron studies in 6 months possible replacement of iron intravenously if needed.  She is currently on oral  iron replacement which I recommended continued.  2.  Thrombocytopenia: Platelet count has fluctuated since at least 2018 levels close to 100,000.  Platelet count on September 2021 was 59 without any signs or symptoms of bleeding.  Differential diagnosis was reviewed at this time.  Cirrhosis of the liver and splenic sequestration remains the likely etiology.  At this time, I do not recommend any specific intervention or any liver transfusion.  She will have follow-up chronic thrombocytopenia moving forward without likely need for any intervention.  Growth factor support or transfusion will be needed if she develops active bleeding or need of surgical procedure.  3.  Follow-up: In 6 months for repeat follow-up.   30  minutes were dedicated to this visit. The time was spent on reviewing laboratory data, imaging  studies, discussing treatment options, discussing differential diagnosis and answering questions regarding future plan.   Zola Button, MD 06/01/2020 11:01 AM

## 2020-06-06 ENCOUNTER — Telehealth: Payer: Self-pay | Admitting: Family Medicine

## 2020-06-06 NOTE — Telephone Encounter (Signed)
Spoke with Pt she has appt with cardiologist 06-07-20 for an EKG, she'll make them aware and ask.

## 2020-06-06 NOTE — Telephone Encounter (Signed)
CB# [phone] Has up coming appt with her dentist wasn't sure if she need antibody for a tooth extraction  also has question concerning Clopidogrel

## 2020-06-06 NOTE — Telephone Encounter (Signed)
Pt wants to know if she nds to keep taking her Clopidogrel.

## 2020-06-06 NOTE — Progress Notes (Signed)
HEART AND Kelly [name]                                       Cardiology Office Note    Date:  06/08/2020   ID:  Kelly [name], DOB [birth year], MRN [id number]  PCP:  [name], MD  Cardiologist:  [name], MD/Dr. Angelena Form & Dr. Roxy Manns (TAVR)  CC: 1 month s/p TAVR  History of Present Illness:  Kelly [name] is a 69 y.o. 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 Angelena Form in 2018. Echocardiogram performed at that time revealed normal left ventricular systolic function with moderate aortic stenosis. She has been followed regularly ever since. She was seen in follow-up last year and reported progressive symptoms of exertional shortness of breath and fatigue with occasional mild dizzy spells. Follow-up echocardiogram revealed moderately severe aortic stenosis with peak velocity across the aortic valve measured 4.80ms corresponding to mean transvalvular gradient estimated 37 mmHg. The DVI was reported 0.31 with aortic valve area calculated 0.9 cm. Left ventricular systolic function remain normal with ejection fraction estimated 60 to 65%. The patient subsequently underwent diagnostic cardiac catheterization which confirmed the presence of severe aortic stenosis with mean transvalvular gradient measured 41 mmHg at catheterization. The patient was foundto havetriple vessel CAD 70% pRCA, 95% RPDA, 40% mRCA, 50% post Atrio, 95% pLAD, 80% D1, 99% 2ndMrg, 50% mid-dist LCx. Due to the covid pandemic her work up was delayed. [facility] and was set up for TAVR at the end of July 2020.However, this was ultimately canceled after preadmission lab work showed a drop in her hemoglobin to 7.6.She was referred to heme-onc and started on iron infusions. Hg eventually improved.  She underwent successful TAVR with a297mMedtronic Evolut ProTHV via the TF approach on9/15/20. She had transient CHB after valve deployment and new LBBB after TAVR. Post operative echo showed EF >65% and an elevated mean gradient of 24 mm Hg (likley 2/2 vigorous LV function and PPM given large body habitus). She was discharged home on aspirin and plavix as well with as a zio patch. This did not show any evidence of HAVB.  Today she presents to clinic for follow up. Here alone. Doing well. Husband having a lot of health issues. No CP or SOB. No LE edema, orthopnea or PND. No dizziness or syncope. No blood in stool or urine. No palpitations.       Past Medical History:  Diagnosis Date   Anxiety    Chronic upper GI bleeding    intestinal AVM's, portal gastropathy, esophageal varices on egd 04/2018   Coronary artery disease involving native coronary artery without angina pectoris    Dementia ([place]2/07/2017   Depression    DIVERTICULITIS OF COLON 06/14/2008   Qualifier: Diagnosis of  By: LoGretta CoolN, ChMalachy Mood   DM (diabetes mellitus), type 2 ([address]2/06/2017   Esophageal varices determined by endoscopy ([place]   Hyperlipidemia    Hypertension    Hypothyroidism    Incidental  pulmonary nodule, greater than or equal to 60m 10/14/2018   LLL nodule needs f/u CT or PET-CT scan   Irritable bowel syndrome 04/06/2008   Centricity Description: IBS Qualifier: Diagnosis of  By: [name] (ADeborra Medina, [name]   Centricity Description: IRRITABLE BOWEL SYNDROME Qualifier: Diagnosis of  By: [name], [name]   Liver cirrhosis secondary to NASH (HH[place] 09/16/2016   NASH (nonalcoholic steatohepatitis)    Obesity    Portal hypertensive gastropathy  ([place]    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 04/20/19: s/p TAVR wtih a 233mMedtronic CoreValve Evolut Pro via the TF approach   Severe aortic stenosis    Splenomegaly 09/16/2016   Thrombocytopenia ([place]   Thyroid disease    Tobacco abuse    Vitamin D deficiency     Past Surgical History:  Procedure Laterality Date   CARDIAC CATHETERIZATION     COLONOSCOPY WITH PROPOFOL N/A 04/09/2018   Procedure: COLONOSCOPY WITH PROPOFOL;  Surgeon: JaMilus BanisterMD;  [facility];  Service: Endoscopy;  Laterality: N/A;   CORONARY ANGIOPLASTY     CORONARY STENT INTERVENTION N/A 02/11/2019   Procedure: CORONARY STENT INTERVENTION;  Surgeon: McBurnell BlanksMD;  [facility];  Service: Cardiovascular;  Laterality: N/A;  Prox RCA Prox LAD   ESOPHAGOGASTRODUODENOSCOPY (EGD) WITH PROPOFOL N/A 04/09/2018   Procedure: ESOPHAGOGASTRODUODENOSCOPY (EGD) WITH PROPOFOL;  Surgeon: JaMilus BanisterMD;  [facility];  Service: Endoscopy;  Laterality: N/A;   oopherectomy     OVARIAN CYST REMOVAL     POLYPECTOMY  04/09/2018   Procedure: POLYPECTOMY;  Surgeon: JaMilus BanisterMD;  [facility];  Service: Endoscopy;;   RIGHT/LEFT HEART CATH AND CORONARY ANGIOGRAPHY N/A 10/07/2018   Procedure: RIGHT/LEFT HEART CATH AND CORONARY ANGIOGRAPHY;  Surgeon: McBurnell BlanksMD;  [facility];  Service: Cardiovascular;  Laterality: N/A;   TEE WITHOUT CARDIOVERSION N/A 04/20/2019   Procedure: TRANSESOPHAGEAL ECHOCARDIOGRAM (TEE);  Surgeon: McBurnell BlanksMD;  [facility] Service: Open Heart Surgery;  Laterality: N/A;   TRANSCATHETER AORTIC VALVE REPLACEMENT, TRANSFEMORAL N/A 04/20/2019   Procedure: TRANSCATHETER AORTIC VALVE REPLACEMENT, TRANSFEMORAL approach using 2363mvolut PRO+ medtronic valve;  Surgeon: McABurnell BlanksD;  [facility];   Laterality: N/A;    Current Medications: Outpatient Medications Prior to Visit  Medication Sig Dispense Refill   ACCU-CHEK AVIVA PLUS test strip      ACCU-CHEK SOFTCLIX LANCETS lancets      amLODipine (NORVASC) 10 MG tablet TAKE 1 TABLET BY MOUTH EVERY DAY 90 tablet 3   aspirin EC 81 MG tablet Take 1 tablet (81 mg total) by mouth daily. 90 tablet 3   atorvastatin (LIPITOR) 20 MG tablet TAKE 1 TABLET EVERY DAY 90 tablet 3   Blood Glucose Monitoring Suppl (ACCU-CHEK AVIVA PLUS) w/Device KIT      clindamycin (CLEOCIN) 300 MG capsule      FEROSUL 325 (65 Fe) MG tablet TAKE 1 TABLET (325 MG TOTAL) BY MOUTH DAILY. 90 tablet 3   furosemide (LASIX) 20 MG tablet TAKE 1 TABLET EVERY DAY 90 tablet 1   glipiZIDE (GLUCOTROL XL) 10 MG 24 hr tablet Take 1 tablet (10 mg total) by mouth daily with breakfast. 90 tablet 0   HYDROcodone-acetaminophen (NORCO) 5-325 MG tablet Take 1 tablet by mouth every 6 (six) hours as needed for moderate pain. 30 tablet 0   levothyroxine (SYNTHROID) 75  MCG tablet TAKE 1 TABLET EVERY DAY 90 tablet 3   losartan (COZAAR) 100 MG tablet TAKE 1 TABLET EVERY DAY 90 tablet 1   metFORMIN (GLUCOPHAGE-XR) 500 MG 24 hr tablet TAKE 2 TABLETS TWICE DAILY 360 tablet 1   pantoprazole (PROTONIX) 40 MG tablet TAKE 1 TABLET EVERY DAY 90 tablet 3   pioglitazone (ACTOS) 30 MG tablet Take 1 tablet (30 mg total) by mouth daily. 90 tablet 3   potassium chloride (KLOR-CON) 10 MEQ tablet Take 2 tablets (20 mEq total) by mouth daily. 180 tablet 0   zolpidem (AMBIEN) 10 MG tablet Take 1 tablet (10 mg total) by mouth at bedtime as needed for sleep. 90 tablet 1   clopidogrel (PLAVIX) 75 MG tablet TAKE 1 TABLET BY MOUTH DAILY. MUST Whitney OVERDUE FOLLOW UP APPT FOR FURTHER REFILLS [id number] 90 tablet 0   potassium chloride (KLOR-CON) 10 MEQ tablet  (Patient not taking: Reported on 06/07/2020)     Facility-Administered Medications Prior to Visit  Medication Dose Route Frequency Provider  Last Rate Last Admin   cyanocobalamin ((VITAMIN B-12)) injection 1,000 mcg  1,000 mcg Subcutaneous Q30 days [name], MD   1,000 mcg at 09/08/17 1408     Allergies:   Prednisone, Celecoxib, and Macrodantin [nitrofurantoin macrocrystal]   Social History   Socioeconomic History   Marital status: Married    Spouse name: Not on file   Number of children: 1   Years of education: Not on file   Highest education level: Not on file  Occupational History   Not on file  Tobacco Use   Smoking status: Current Every Day Smoker    Packs/day: 0.50    Types: Cigarettes   Smokeless tobacco: Never Used  Vaping Use   Vaping Use: Never used  Substance and Sexual Activity   Alcohol use: No   Drug use: Not Currently    Types: Benzodiazepines   Sexual activity: Not Currently  Other Topics Concern   Not on file  Social History Narrative   Not on file   Social Determinants of Health   Financial Resource Strain:    Difficulty of Paying Living Expenses: Not on file  Food Insecurity:    Worried About [place] in the Last Year: Not on file   Ran Out of Food in the Last Year: Not on file  Transportation Needs:    Lack of Transportation (Medical): Not on file   Lack of Transportation (Non-Medical): Not on file  Physical Activity:    Days of Exercise per Week: Not on file   Minutes of Exercise per Session: Not on file  Stress:    Feeling of Stress : Not on file  Social Connections:    Frequency of Communication with Friends and Family: Not on file   Frequency of Social Gatherings with Friends and Family: Not on file   Attends Religious Services: Not on file   Active Member of Clubs or Organizations: Not on file   Attends Archivist Meetings: Not on file   Marital Status: Not on file     Family History:  The patient's family history includes Alcohol abuse in her father; Alzheimer's disease in her mother; Dementia in her mother; Heart  disease in her father; Lung cancer in her father; Other in her mother.     ROS:   Please see the history of present illness.    ROS All other systems reviewed and are negative.   PHYSICAL EXAM:  VS:  BP 126/90    Pulse 63    Ht 5' 4.5" (1.638 m)    Wt 209 lb 3.2 oz (94.9 kg)    SpO2 98%    BMI 35.35 kg/m    GEN: Well nourished, well developed, in no acute distress, obese HEENT: normal Neck: no JVD or masses Cardiac: RRR; soft flow murmur. No rubs, or gallops,no edema  Respiratory:  clear to auscultation bilaterally, normal work of breathing GI: soft, nontender, nondistended, + BS MS: no deformity or atrophy Skin: warm and dry, no rash Neuro:  Alert and Oriented x 3, Strength and sensation are intact Psych: euthymic mood, full affect   Wt Readings from Last 3 Encounters:  06/07/20 209 lb 3.2 oz (94.9 kg)  06/01/20 206 lb 6.4 oz (93.6 kg)  04/28/20 213 lb (96.6 kg)      Studies/Labs Reviewed:   EKG:  EKG is NOT ordered today.    Recent Labs: 04/28/2020: ALT 11; BUN 13; Creat 0.84; Hemoglobin 12.4; Platelets 59; Potassium 3.6; Sodium 140; TSH 4.01   Lipid Panel    Component Value Date/Time   CHOL 147 04/28/2020 1254   TRIG 139 04/28/2020 1254   HDL 47 (L) 04/28/2020 1254   CHOLHDL 3.1 04/28/2020 1254   VLDL 20 03/26/2017 0908   LDLCALC 76 04/28/2020 1254    Additional studies/ records that were reviewed today include:  TAVR OPERATIVE NOTE   Date of Procedure:04/20/2019  Preoperative Diagnosis:Severe Aortic Stenosis   Postoperative Diagnosis:Same   Procedure:   Transcatheter Aortic Valve Replacement - PercutaneousRightTransfemoral Approach Medtronic CoreValve Evolut Pro (size 48m, serial # [id number]  Co-Surgeons:Christopher MAngelena Form MD andClarence H. ORoxy Manns MD  Anesthesiologist:Adam HMarcie Bal MD  Echocardiographer:Peter NJohnsie Cancel  MD  Pre-operative Echo Findings: ? Severe aortic stenosis ? Normalleft ventricular systolic function  Post-operative Echo Findings: ? Noparavalvular leak ? Normalleft ventricular systolic function  __________________   Echo 04/21/19: IMPRESSIONS 1. The left ventricle has hyperdynamic systolic function, with an ejection fraction of >65%. The cavity size was normal. There is moderately increased left ventricular wall thickness. Left ventricular diastolic Doppler parameters are consistent with  impaired relaxation. 2. The right ventricle has normal systolic function. The cavity was normal. There is No increase in right ventricular wall thickness. 3. Left atrial size was normal. 4. Right atrial size was normal. 5. No evidence of mitral valve stenosis. 6. A 215ma Medtronic CoreValve-Evolut Pro bioprosthetic aortic valve (TAVR) valve is present in the aortic position. 7. Aortic valve regurgitation was not visualized by color flow Doppler. Moderate stenosis of the aortic valve. 8. Pulmonic valve regurgitation is not visualized by color flow Doppler. 9. The aorta is normal unless otherwise noted.  __________________   Echo 05/13/19 IMPRESSIONS  1. Left ventricular ejection fraction, by visual estimation, is 60 to 65%. The left ventricle has normal function. Normal left ventricular size. There is moderately increased left ventricular hypertrophy. Normal wall motion.  2. Left ventricular diastolic Doppler parameters are consistent with impaired relaxation pattern of LV diastolic filling.  3. The tricuspid valve is normal in structure. Tricuspid valve regurgitation is mild.  4. The mitral valve is normal in structure. Trace mitral valve regurgitation. No evidence of mitral stenosis.  5. Global right ventricle has normal systolic function.The right ventricular size is normal. No increase in right ventricular wall thickness.  6. Bioprosthetic aortic valve s/p TAVR. Trivial  peri-valvular leak. Mean gradient 14 mmHg with AVA 1.6 cm^2.  7. Mildly dilated left atrium.  8. Right atrial size was  normal.  9. The inferior vena cava is normal in size with greater than 50% respiratory variability, suggesting right atrial pressure of 3 mmHg. 10. The tricuspid regurgitant velocity is 2.65 m/s, and with an assumed right atrial pressure of 3 mmHg, the estimated right ventricular systolic pressure is mildly elevated at 31.1 mmHg. 11. Trivial pericardial effusion is present.  ______________________  Echo 06/07/20  IMPRESSIONS    1. Left ventricular ejection fraction, by estimation, is 60 to 65%. The left ventricle has normal function. The left ventricle has no regional wall motion abnormalities. There is moderate concentric left ventricular hypertrophy. Left ventricular  diastolic parameters are consistent with Grade I diastolic dysfunction (impaired relaxation). Elevated left ventricular end-diastolic pressure.  2. Right ventricular systolic function is normal. The right ventricular size is normal. There is normal pulmonary artery systolic pressure.  3. Left atrial size was mildly dilated.  4. The mitral valve is normal in structure. Trivial mitral valve regurgitation. No evidence of mitral stenosis.  5. Trivial perivalvular aortic regurgitation. TAVR aortic valve gradients are unchanged from 05/2019. The aortic valve has been repaired/replaced. Aortic valve regurgitation is trivial. There is a 23 mm CoreValve-Evolut Pro prosthetic (TAVR) valve  present in the aortic position. Procedure Date: 04/20/2019.  6. The inferior vena cava is normal in size with greater than 50% respiratory variability, suggesting right atrial pressure of 3 mmHg.   ASSESSMENT & PLAN:   Severe AS s/p TAVR: echo today shows EF 60-65%, normally functioning TAVR with mean gradient of 13 mm Hg and trivial PVL. She has NYHA class II symptoms. SBE prophylaxis discussed; she has amoxicillin. Currently on  DAPT still. Plan to stop Plavix now.   CAD: [facility] and PCI/DES to mLAD on 02/11/19. Continue aspirin and statin   HTN: BP well controlled today.   Pulmonary nodule: pre TAVR scans showed a 10 x 11 mm left lower lobe pulmonary nodule, new compared to remote prior study from 2009. Follow up PET-CT 05/10/19 was reassuring but will require follow up over time. This is now followed by Dr. [name].    Medication Adjustments/Labs and Tests Ordered: Current medicines are reviewed at length with the patient today.  Concerns regarding medicines are outlined above.  Medication changes, Labs and Tests ordered today are listed in the Patient Instructions below. Patient Instructions  Medication Instructions:  1) STOP PLAVIX 2) Your provider discussed the importance of taking an antibiotic prior to all dental visits to prevent damage to the heart valves from infection. You were given a prescription for AMOXIL 2,000 mg to take one hour prior to any dental appointment.  *If you need a refill on your cardiac medications before your next appointment, please call your pharmacy*   Follow-Up: At [facility], you and your health needs are our priority.  As part of our continuing mission to provide you with exceptional heart care, we have created designated Provider Care Teams.  These Care Teams include your primary Cardiologist (physician) and Advanced Practice Providers (APPs -  Physician Assistants and Nurse Practitioners) who all work together to provide you with the care you need, when you need it. Your next appointment:   6 month(s) The format for your next appointment:   In Person Provider:   You [place] see [name], MD or one of the following Advanced Practice Providers on your designated Care Team:    Melina Copa, PA-C  Ermalinda Barrios, PA-C      Signed, Angelena Form, PA-C  06/08/2020 6:55 AM  [facility] [address], [place], [place]   [address] Phone: [phone]; Fax: [phone]

## 2020-06-06 NOTE — Telephone Encounter (Signed)
She needs to call her cardiologist and verify.  However, she had a stent in 02/2019 and usually they stop plavix and just do aspirin after 1 year.  But she needs to verify this with them.

## 2020-06-07 ENCOUNTER — Ambulatory Visit (INDEPENDENT_AMBULATORY_CARE_PROVIDER_SITE_OTHER): Payer: Medicare HMO | Admitting: Physician Assistant

## 2020-06-07 ENCOUNTER — Ambulatory Visit (HOSPITAL_COMMUNITY): Payer: Medicare HMO | Attending: Cardiovascular Disease

## 2020-06-07 ENCOUNTER — Other Ambulatory Visit: Payer: Self-pay

## 2020-06-07 ENCOUNTER — Encounter: Payer: Self-pay | Admitting: Physician Assistant

## 2020-06-07 ENCOUNTER — Other Ambulatory Visit: Payer: Self-pay | Admitting: Family Medicine

## 2020-06-07 VITALS — BP 126/90 | HR 63 | Ht 64.5 in | Wt 209.2 lb

## 2020-06-07 DIAGNOSIS — Z952 Presence of prosthetic heart valve: Secondary | ICD-10-CM

## 2020-06-07 DIAGNOSIS — R911 Solitary pulmonary nodule: Secondary | ICD-10-CM | POA: Diagnosis not present

## 2020-06-07 DIAGNOSIS — I10 Essential (primary) hypertension: Secondary | ICD-10-CM

## 2020-06-07 DIAGNOSIS — I2510 Atherosclerotic heart disease of native coronary artery without angina pectoris: Secondary | ICD-10-CM

## 2020-06-07 DIAGNOSIS — Z720 Tobacco use: Secondary | ICD-10-CM

## 2020-06-07 DIAGNOSIS — I350 Nonrheumatic aortic (valve) stenosis: Secondary | ICD-10-CM

## 2020-06-07 LAB — ECHOCARDIOGRAM COMPLETE
AR max vel: 1.36 cm2
AV Area VTI: 1.79 cm2
AV Area mean vel: 1.48 cm2
AV Mean grad: 13 mmHg
AV Peak grad: 25.4 mmHg
Ao pk vel: 2.52 m/s
Area-P 1/2: 2.76 cm2
MV M vel: 5.87 m/s
MV Peak grad: 137.8 mmHg
P 1/2 time: 552 msec
S' Lateral: 3.4 cm

## 2020-06-07 MED ORDER — AMOXICILLIN 500 MG PO TABS: ORAL_TABLET | ORAL | 11 refills | Status: DC

## 2020-06-07 NOTE — Patient Instructions (Signed)
Medication Instructions:  1) STOP PLAVIX 2) Your provider discussed the importance of taking an antibiotic prior to all dental visits to prevent damage to the heart valves from infection. You were given a prescription for AMOXIL 2,000 mg to take one hour prior to any dental appointment.  *If you need a refill on your cardiac medications before your next appointment, please call your pharmacy*   Follow-Up: At [facility], you and your health needs are our priority.  As part of our continuing mission to provide you with exceptional heart care, we have created designated Provider Care Teams.  These Care Teams include your primary Cardiologist (physician) and Advanced Practice Providers (APPs -  Physician Assistants and Nurse Practitioners) who all work together to provide you with the care you need, when you need it. Your next appointment:   6 month(s) The format for your next appointment:   In Person Provider:   You [place] see [name], MD or one of the following Advanced Practice Providers on your designated Care Team:    Melina Copa, PA-C  Ermalinda Barrios, PA-C

## 2020-06-08 ENCOUNTER — Other Ambulatory Visit: Payer: Self-pay | Admitting: *Deleted

## 2020-06-08 ENCOUNTER — Telehealth: Payer: Self-pay | Admitting: *Deleted

## 2020-06-08 ENCOUNTER — Other Ambulatory Visit: Payer: Self-pay | Admitting: Family Medicine

## 2020-06-08 DIAGNOSIS — R911 Solitary pulmonary nodule: Secondary | ICD-10-CM

## 2020-06-08 NOTE — Telephone Encounter (Signed)
Call placed to imaging center.   Reports that CT Chest WO contrast is standard for F/U lung nodules.   Orders placed.

## 2020-06-08 NOTE — Telephone Encounter (Signed)
I am fine changing to whatever they need to get it scheduled.  Verify and order and I will sign.  For follow up of lung nodule.

## 2020-06-08 NOTE — Telephone Encounter (Signed)
Received following 23mssage [facility] to CT without Contrast so they can get the pt sch  Please advise.

## 2020-06-09 ENCOUNTER — Other Ambulatory Visit: Payer: Self-pay | Admitting: Cardiovascular Disease

## 2020-06-09 ENCOUNTER — Other Ambulatory Visit: Payer: Self-pay

## 2020-06-09 MED ORDER — AMOXICILLIN 500 MG PO TABS
ORAL_TABLET | ORAL | 11 refills | Status: DC
Start: 2020-06-09 — End: 2020-07-27

## 2020-06-09 NOTE — Telephone Encounter (Signed)
Pt calling requesting that her amoxicillin be resent to her local pharmacy. The medication was sent to a mail order pharmacy and pt stated that she would like for it to be sent to her local pharmacy. I resent pt's medication to her preferred pharmacy. Confirmation received.

## 2020-06-13 ENCOUNTER — Telehealth: Payer: Self-pay | Admitting: Physician Assistant

## 2020-06-13 NOTE — Telephone Encounter (Signed)
New Message:     Pt says she needs to ask Kelly [name] if she need to stop her Plavix, because she is having her tooth extracted on Saturday?

## 2020-06-13 NOTE — Telephone Encounter (Signed)
Per Kelly [name]'s 06/07/20 office note, reiterated to the patient she was instructed to STOP PLAVIX at that time. She states she hasn't been taking Plavix but wanted to make sure. Reiterated to the patient she will need to take her amoxicillin 1 hour prior to her dental visits.  She was grateful for assistance.

## 2020-06-17 DIAGNOSIS — R69 Illness, unspecified: Secondary | ICD-10-CM | POA: Diagnosis not present

## 2020-06-21 ENCOUNTER — Telehealth: Payer: Self-pay

## 2020-06-21 NOTE — Telephone Encounter (Signed)
Pt has had tooth abstracted on 06-17-20, and has been taking amoxicillin, she now has thrush on her tongue. She's asking should she continue to take the abx or stop it?

## 2020-06-22 ENCOUNTER — Other Ambulatory Visit: Payer: Self-pay | Admitting: Family Medicine

## 2020-06-22 MED ORDER — NYSTATIN 100000 UNIT/ML MT SUSP: 5.0000 mL | Freq: Four times a day (QID) | OROMUCOSAL | 0 refills | Status: DC

## 2020-06-22 NOTE — Telephone Encounter (Signed)
Finish abx, I will send in nystatin.

## 2020-06-22 NOTE — Telephone Encounter (Signed)
Spoke with pt regarding recommendations for new meds.

## 2020-07-04 ENCOUNTER — Telehealth: Payer: Self-pay | Admitting: Family Medicine

## 2020-07-04 NOTE — Telephone Encounter (Signed)
Patient called in this morning requesting a refill on her HYDROcodone-acetaminophen (NORCO) 5-325 MG tablet She is going to have more stuff done to her tooth and this seems to help with that pain.  [facility] uses [facility] on [address]  CB# [phone]

## 2020-07-05 ENCOUNTER — Other Ambulatory Visit: Payer: Self-pay | Admitting: Family Medicine

## 2020-07-05 MED ORDER — HYDROCODONE-ACETAMINOPHEN 5-325 MG PO TABS: 1.0000 | ORAL_TABLET | Freq: Four times a day (QID) | ORAL | 0 refills | Status: DC | PRN

## 2020-07-06 ENCOUNTER — Ambulatory Visit
Admission: RE | Admit: 2020-07-06 | Discharge: 2020-07-06 | Disposition: A | Discharge status: Home / Self Care | Payer: Medicare HMO | Source: Ambulatory Visit | Attending: Family Medicine | Admitting: Family Medicine

## 2020-07-06 ENCOUNTER — Other Ambulatory Visit: Payer: Self-pay | Admitting: Physician Assistant

## 2020-07-06 DIAGNOSIS — I2510 Atherosclerotic heart disease of native coronary artery without angina pectoris: Secondary | ICD-10-CM | POA: Diagnosis not present

## 2020-07-06 DIAGNOSIS — R911 Solitary pulmonary nodule: Secondary | ICD-10-CM | POA: Diagnosis not present

## 2020-07-06 DIAGNOSIS — I700 Atherosclerosis of aorta: Secondary | ICD-10-CM | POA: Diagnosis not present

## 2020-07-24 ENCOUNTER — Telehealth: Payer: Self-pay | Admitting: Family Medicine

## 2020-07-24 NOTE — Telephone Encounter (Signed)
Please schedule patient with tele health visit

## 2020-07-24 NOTE — Telephone Encounter (Signed)
Patient called in she doesn't have a way to an office visit. She states that her nerves aren't good and that she got the booster shot a couple of weeks ago and she now has knotts in her head. She states that her husband had back surgery on Thursday and she isn't sure if that is what has her nerves tore up or what.  CB# [phone]

## 2020-07-25 NOTE — Telephone Encounter (Signed)
Patient is scheduled for Thursday 07/27/2020 @ 2:00.

## 2020-07-27 ENCOUNTER — Telehealth (INDEPENDENT_AMBULATORY_CARE_PROVIDER_SITE_OTHER): Payer: Medicare HMO | Admitting: Family Medicine

## 2020-07-27 ENCOUNTER — Other Ambulatory Visit: Payer: Self-pay

## 2020-07-27 DIAGNOSIS — F418 Other specified anxiety disorders: Secondary | ICD-10-CM

## 2020-07-27 MED ORDER — CLONAZEPAM 0.5 MG PO TABS: 0.5000 mg | ORAL_TABLET | Freq: Two times a day (BID) | ORAL | 0 refills | Status: DC | PRN

## 2020-07-27 NOTE — Progress Notes (Signed)
Subjective:    Patient ID: Kelly [name], female    DOB: [birth year], 69 y.o.   MRN: [id number]  HPI Patient is being seen today as a telephone visit.  Phone call began at 158.  Phone call concluded at 213.  Patient consents to be seen via telephone.  She is currently at home.  I am currently in my office.  Patient is under tremendous amount of stress recently.  She has had numerous health problems including open heart surgery, valve repair.  She also is dealing with diabetes and fatty liver disease.  Her husband has recently had numerous surgeries.  She is now his main caregiver.  Patient states that he is extremely demanding and "needy".  This causes her to feel on edge constantly.  She never feels like she can have a break.  She is afraid to sleep at night in case he falls going to the bathroom.  At times she feels overwhelmed and has panic attacks.  She denies any depression or anhedonia or trouble concentrating.  Medical history is complicated by mild age-related cognitive impairment.  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  She was unaware of how the medications could affect her.  Since that time we have avoided benzodiazepines.  She was taking Ambien but she is stopped taking that for sleep.  She was also taking hydrocodone for tooth pain but she has stopped that as well Past Medical History:  Diagnosis Date  . Anxiety   . Chronic upper GI bleeding    intestinal AVM's, portal gastropathy, esophageal varices on egd 04/2018  . Coronary artery disease involving native coronary artery without angina pectoris   . Dementia ([place]) 09/16/2016  . Depression   . DIVERTICULITIS OF COLON 06/14/2008   Qualifier: Diagnosis of  By: [name]    . DM (diabetes mellitus), type 2 ([place]) 09/15/2016  . Esophageal varices determined by endoscopy ([place])   . Hyperlipidemia   . Hypertension   . Hypothyroidism   . Incidental  pulmonary nodule, greater than or equal to 72m 10/14/2018   LLL nodule needs f/u CT or PET-CT scan  . Irritable bowel syndrome 04/06/2008   Centricity Description: IBS Qualifier: Diagnosis of  By: [name] (ADeborra Medina, [name]   Centricity Description: IRRITABLE BOWEL SYNDROME Qualifier: Diagnosis of  By: [name], [name]  . Liver cirrhosis secondary to NASH ([place] 09/16/2016  . NASH (nonalcoholic steatohepatitis)   . Obesity   . Portal hypertensive gastropathy ([place]   . 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 04/20/19: s/p TAVR wtih a 253mMedtronic CoreValve Evolut Pro via the TF approach  . Severe aortic stenosis   . Splenomegaly 09/16/2016  . Thrombocytopenia ([facility]  . Thyroid disease   . Tobacco abuse   . Vitamin D deficiency    Past Surgical History:  Procedure Laterality Date  . CARDIAC CATHETERIZATION    . COLONOSCOPY WITH PROPOFOL N/A 04/09/2018   Procedure: COLONOSCOPY WITH PROPOFOL;  Surgeon: JaMilus BanisterMD;  Location: [facility];  Service: Endoscopy;  Laterality: N/A;  . CORONARY ANGIOPLASTY    . CORONARY STENT INTERVENTION N/A 02/11/2019   Procedure: CORONARY STENT INTERVENTION;  Surgeon: McBurnell BlanksMD;  [facility];  Service: Cardiovascular;  Laterality: N/A;  Prox RCA Prox LAD  . ESOPHAGOGASTRODUODENOSCOPY (EGD) WITH PROPOFOL N/A 04/09/2018   Procedure: ESOPHAGOGASTRODUODENOSCOPY (EGD) WITH  PROPOFOL;  Surgeon: [name], MD;  Location: [facility];  Service: Endoscopy;  Laterality: N/A;  . oopherectomy    . OVARIAN CYST REMOVAL    . POLYPECTOMY  04/09/2018   Procedure: POLYPECTOMY;  Surgeon: [name], MD;  Location: [facility];  Service: Endoscopy;;  . RIGHT/LEFT HEART CATH AND CORONARY ANGIOGRAPHY N/A 10/07/2018   Procedure: RIGHT/LEFT HEART CATH AND CORONARY ANGIOGRAPHY;  Surgeon: [name], MD;  Location: [facility];  Service: Cardiovascular;  Laterality: N/A;  . TEE WITHOUT  CARDIOVERSION N/A 04/20/2019   Procedure: TRANSESOPHAGEAL ECHOCARDIOGRAM (TEE);  Surgeon: [name], MD;  Location: [facility];  Service: Open Heart Surgery;  Laterality: N/A;  . TRANSCATHETER AORTIC VALVE REPLACEMENT, TRANSFEMORAL N/A 04/20/2019   Procedure: TRANSCATHETER AORTIC VALVE REPLACEMENT, TRANSFEMORAL approach using 24m Evolut PRO+ medtronic valve;  Surgeon: MBurnell Blanks MD;  [facility]  Service: Open Heart Surgery;  Laterality: N/A;   Current Outpatient Medications on File Prior to Visit  Medication Sig Dispense Refill  . ACCU-CHEK AVIVA PLUS test strip     . ACCU-CHEK SOFTCLIX LANCETS lancets     . amLODipine (NORVASC) 10 MG tablet TAKE 1 TABLET BY MOUTH EVERY DAY 90 tablet 3  . aspirin EC 81 MG tablet Take 1 tablet (81 mg total) by mouth daily. 90 tablet 3  . atorvastatin (LIPITOR) 20 MG tablet TAKE 1 TABLET EVERY DAY 90 tablet 3  . Blood Glucose Monitoring Suppl (ACCU-CHEK AVIVA PLUS) w/Device KIT     . clindamycin (CLEOCIN) 300 MG capsule     . FEROSUL 325 (65 Fe) MG tablet TAKE 1 TABLET (325 MG TOTAL) BY MOUTH DAILY. 90 tablet 3  . furosemide (LASIX) 20 MG tablet TAKE 1 TABLET EVERY DAY 90 tablet 1  . glipiZIDE (GLUCOTROL XL) 10 MG 24 hr tablet Take 1 tablet (10 mg total) by mouth daily with breakfast. 90 tablet 0  . HYDROcodone-acetaminophen (NORCO) 5-325 MG tablet Take 1 tablet by mouth every 6 (six) hours as needed for moderate pain. 30 tablet 0  . levothyroxine (SYNTHROID) 75 MCG tablet TAKE 1 TABLET EVERY DAY 90 tablet 3  . losartan (COZAAR) 100 MG tablet TAKE 1 TABLET EVERY DAY 90 tablet 1  . metFORMIN (GLUCOPHAGE-XR) 500 MG 24 hr tablet TAKE 2 TABLETS TWICE DAILY 360 tablet 1  . nystatin (MYCOSTATIN) 100000 UNIT/ML suspension Take 5 mLs (500,000 Units total) by mouth 4 (four) times daily. 60 mL 0  . pantoprazole (PROTONIX) 40 MG tablet TAKE 1 TABLET EVERY DAY 90 tablet 3  . pioglitazone (ACTOS) 30 MG tablet Take 1 tablet (30 mg total) by mouth  daily. 90 tablet 3  . potassium chloride (KLOR-CON) 10 MEQ tablet TAKE 2 TABLETS (20 MEQ TOTAL) BY MOUTH DAILY. 180 tablet 3   Current Facility-Administered Medications on File Prior to Visit  Medication Dose Route Frequency Provider Last Rate Last Admin  . cyanocobalamin ((VITAMIN B-12)) injection 1,000 mcg  1,000 mcg Subcutaneous Q30 days PSusy Frizzle MD   1,000 mcg at 09/08/17 1408   Allergies  Allergen Reactions  . Prednisone Itching    Heart rate increased  . Celecoxib Rash  . Macrodantin [Nitrofurantoin Macrocrystal] Rash   Social History   Socioeconomic History  . Marital status: Married    Spouse name: Not on file  . Number of children: 1  . Years of education: Not on file  . Highest education level: Not on file  Occupational History  . Not on file  Tobacco Use  . Smoking status: Current Every Day Smoker    Packs/day: 0.50    Types: Cigarettes  . Smokeless tobacco: Never Used  Vaping Use  . Vaping Use: Never used  Substance and Sexual Activity  . Alcohol use: No  . Drug use: Not Currently    Types: Benzodiazepines  . Sexual activity: Not Currently  Other Topics Concern  . Not on file  Social History Narrative  . Not on file   Social Determinants of Health   Financial Resource Strain: Not on file  Food Insecurity: Not on file  Transportation Needs: Not on file  Physical Activity: Not on file  Stress: Not on file  Social Connections: Not on file  Intimate Partner Violence: Not on file     Review of Systems  All other systems reviewed and are negative.      Objective:   Physical Exam        Assessment & Plan:  Situational anxiety  I explained the medication to the patient.  I explained to her that it can exacerbate memory issues which she has some mild cognitive impairment.  Also explained that could cause dizziness and falls.  However the patient would like a medication that she could use a low-dose sparingly when she feels overwhelmed  and anxious and panicked.  Therefore we will try low-dose Klonopin 0.5 mg p.o. every 12 hours as needed anxiety.  I counseled her extensively not to overuse the medication to avoid falls and confusion.  She states that she understands how the medication can affect her and she will not overuse the medication.  She really just needs some assistance during this difficult time in her family

## 2020-08-16 ENCOUNTER — Other Ambulatory Visit: Payer: Self-pay | Admitting: Family Medicine

## 2020-09-04 ENCOUNTER — Other Ambulatory Visit: Payer: Self-pay

## 2020-09-04 ENCOUNTER — Telehealth (INDEPENDENT_AMBULATORY_CARE_PROVIDER_SITE_OTHER): Payer: Medicare HMO | Admitting: Nurse Practitioner

## 2020-09-04 DIAGNOSIS — R3915 Urgency of urination: Secondary | ICD-10-CM | POA: Diagnosis not present

## 2020-09-04 DIAGNOSIS — R319 Hematuria, unspecified: Secondary | ICD-10-CM | POA: Diagnosis not present

## 2020-09-04 MED ORDER — SULFAMETHOXAZOLE-TRIMETHOPRIM 800-160 MG PO TABS: 1.0000 | ORAL_TABLET | Freq: Two times a day (BID) | ORAL | 0 refills | Status: DC

## 2020-09-04 NOTE — Progress Notes (Signed)
Subjective:    Patient ID: Kelly [name], female    DOB: [birth year], 70 y.o.   MRN: [id number]  HPI: Kelly [name] is a 70 y.o. female presenting virtually for blood in her urine.  Chief Complaint  Patient presents with  . Abdominal Pain    CALL PT- Pt is not able to come in for urine sample. Pressure in the stomach, having some blood show in the urine. This is something that has happened before. Pressure started last wk. Taking no meds for this sx   URINARY SYMPTOMS Duration: ~1 week ago; has a history of similar symptoms every year or so Dysuria: no Urinary frequency: yes Urgency: yes Small volume voids: yes Symptom severity: moderate Urinary incontinence: yes Foul odor: yes Hematuria: yes; clear with blood in morning; reports this happened last time Abdominal pain: in "lower part" Back pain: no Suprapubic pain/pressure: yes Flank pain: no Fever:  No Nausea: no Vomiting: no Relief with cranberry juice: no Relief with pyridium: no Status: stable Previous urinary tract infection: yes Recurrent urinary tract infection: no Vaginal discharge: no  Treatments attempted: increasing fluids   Allergies  Allergen Reactions  . Prednisone Itching    Heart rate increased  . Celecoxib Rash  . Macrodantin [Nitrofurantoin Macrocrystal] Rash    Outpatient Encounter Medications as of 09/04/2020  Medication Sig  . ACCU-CHEK AVIVA PLUS test strip   . ACCU-CHEK SOFTCLIX LANCETS lancets   . amLODipine (NORVASC) 10 MG tablet TAKE 1 TABLET BY MOUTH EVERY DAY  . aspirin EC 81 MG tablet Take 1 tablet (81 mg total) by mouth daily.  Marland Kitchen atorvastatin (LIPITOR) 20 MG tablet TAKE 1 TABLET EVERY DAY  . Blood Glucose Monitoring Suppl (ACCU-CHEK AVIVA PLUS) w/Device KIT   . clindamycin (CLEOCIN) 300 MG capsule   . clonazePAM (KLONOPIN) 0.5 MG tablet Take 1 tablet (0.5 mg total) by mouth 2 (two) times daily as needed for anxiety (use sparingly).  . FEROSUL 325 (65 Fe) MG tablet TAKE 1 TABLET  (325 MG TOTAL) BY MOUTH DAILY.  . furosemide (LASIX) 20 MG tablet TAKE 1 TABLET EVERY DAY  . glipiZIDE (GLUCOTROL XL) 10 MG 24 hr tablet Take 1 tablet (10 mg total) by mouth daily with breakfast.  . HYDROcodone-acetaminophen (NORCO) 5-325 MG tablet Take 1 tablet by mouth every 6 (six) hours as needed for moderate pain.  Marland Kitchen levothyroxine (SYNTHROID) 75 MCG tablet TAKE 1 TABLET EVERY DAY  . losartan (COZAAR) 100 MG tablet TAKE 1 TABLET EVERY DAY  . metFORMIN (GLUCOPHAGE-XR) 500 MG 24 hr tablet TAKE 2 TABLETS TWICE DAILY  . nystatin (MYCOSTATIN) 100000 UNIT/ML suspension Take 5 mLs (500,000 Units total) by mouth 4 (four) times daily.  . pantoprazole (PROTONIX) 40 MG tablet TAKE 1 TABLET EVERY DAY  . pioglitazone (ACTOS) 30 MG tablet Take 1 tablet (30 mg total) by mouth daily.  . potassium chloride (KLOR-CON) 10 MEQ tablet TAKE 2 TABLETS (20 MEQ TOTAL) BY MOUTH DAILY.  Marland Kitchen sulfamethoxazole-trimethoprim (BACTRIM DS) 800-160 MG tablet Take 1 tablet by mouth 2 (two) times daily.   Facility-Administered Encounter Medications as of 09/04/2020  Medication  . cyanocobalamin ((VITAMIN B-12)) injection 1,000 mcg    Patient Active Problem List   Diagnosis Date Noted  . Acute on chronic diastolic heart failure ([place]) 04/21/2019  . LBBB (left bundle branch block) 04/21/2019  . 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 Obesity   . NASH (nonalcoholic steatohepatitis)   . Tobacco abuse   . Coronary  artery disease involving native coronary artery without angina pectoris   . Incidental pulmonary nodule, greater than or equal to 69m 10/14/2018  . Severe aortic stenosis   . Esophageal varices determined by endoscopy ([place]   . Polyp of transverse colon   . Esophageal varices without bleeding ([place]   . Portal hypertensive gastropathy ([facility]   . Iron deficiency anemia 03/06/2018  . Vitamin B 12 deficiency 09/16/2016  . Dementia ([facility] 09/16/2016  . Splenomegaly 09/16/2016  . Liver cirrhosis  secondary to NASH ([facility] 09/16/2016  . Thrombocytopenia ([facility] 09/15/2016  . DM (diabetes mellitus), type 2 ([place] 09/15/2016  . Hypertension 09/14/2016  . Depression 09/14/2016  . DIVERTICULITIS OF COLON 06/14/2008  . Irritable bowel syndrome 04/06/2008    Past Medical History:  Diagnosis Date  . Anxiety   . Chronic upper GI bleeding    intestinal AVM's, portal gastropathy, esophageal varices on egd 04/2018  . Coronary artery disease involving native coronary artery without angina pectoris   . Dementia ([facility] 09/16/2016  . Depression   . DIVERTICULITIS OF COLON 06/14/2008   Qualifier: Diagnosis of  By: LMat Carne   . DM (diabetes mellitus), type 2 ([place] 09/15/2016  . Esophageal varices determined by endoscopy ([facility]   . Hyperlipidemia   . Hypertension   . Hypothyroidism   . Incidental pulmonary nodule, greater than or equal to 838m3/06/2019   LLL nodule needs f/u CT or PET-CT scan  . Irritable bowel syndrome 04/06/2008   Centricity Description: IBS Qualifier: Diagnosis of  By: LeBobby RumpfMA (AADeborra Medina [name]   Centricity Description: IRRITABLE BOWEL SYNDROME Qualifier: Diagnosis of  By: JaArdis HughsD, DaMelene Plan . Liver cirrhosis secondary to NASH (HCNew Melle2/07/2017  . NASH (nonalcoholic steatohepatitis)   . Obesity   . Portal hypertensive gastropathy ([facility]  . 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 04/20/19: s/p TAVR wtih a 2362medtronic CoreValve Evolut Pro via the TF approach  . Severe aortic stenosis   . Splenomegaly 09/16/2016  . Thrombocytopenia ([facility] . Thyroid disease   . Tobacco abuse   . Vitamin D deficiency     Relevant past medical, surgical, family and social history reviewed and updated as indicated. Interim medical history since our last visit reviewed.  Review of Systems Per HPI unless specifically indicated above     Objective:    There were no vitals taken for this visit.  Wt Readings from Last 3 Encounters:  06/07/20 209 lb 3.2 oz (94.9  kg)  06/01/20 206 lb 6.4 oz (93.6 kg)  04/28/20 213 lb (96.6 kg)    Physical Exam  Physical examination unable to be performed due to lack of equipment.  Patient talking in complete sentences during telemedicine visit.     Assessment & Plan:  1. Urinary urgency Acute, ongoing x 1 week.  Unable to evaluate urine due to patient not having transportation to our clinic today; husband with recent back surgery and son/family sick with COVID-19.  With frequent reported UTI with similar symptoms, will treat with Bactrim DS x 3 days.  Encouraged follow up in-person if symptoms do not improve with treatment. - sulfamethoxazole-trimethoprim (BACTRIM DS) 800-160 MG tablet; Take 1 tablet by mouth 2 (two) times daily.  Dispense: 6 tablet; Refill: 0  2. Hematuria, unspecified type Acute, ongoing.  Reports blood in her urine when she gets UTIs.  Will treat for UTI given transportation issues, however dicussed with her that we would prefer to have  a urine sample to correlate treatment with.  Stressed importance of following up with [place] in person if her symptoms do not completely resolve for further evaluation.  Patient verbalized understanding.  Follow up plan: Return if symptoms worsen or fail to improve.  This visit was completed via telephone due to the restrictions of the COVID-19 pandemic. All issues as above were discussed and addressed but no physical exam was performed. If it was felt that the patient should be evaluated in the office, they were directed there. The patient verbally consented to this visit. Patient was unable to complete an audio/visual visit due to Lack of equipment. . Location of the patient: home . Location of the provider: work . Those involved with this call:  . Provider: Carnella Guadalajara, DNP . CMA: Annabelle Harman, CMA . Front Desk/Registration: Jeralene Peters  . Time spent on call: 15 minutes on the phone discussing health concerns. 30 minutes total spent in review of patient's  record and preparation of their chart.  I verified patient identity using two factors (patient name and date of birth). Patient consents verbally to being seen via telemedicine visit today.

## 2020-09-13 ENCOUNTER — Other Ambulatory Visit: Payer: Self-pay

## 2020-09-13 ENCOUNTER — Other Ambulatory Visit: Payer: Self-pay | Admitting: Family Medicine

## 2020-09-13 ENCOUNTER — Encounter: Payer: Self-pay | Admitting: Nurse Practitioner

## 2020-09-13 ENCOUNTER — Ambulatory Visit (INDEPENDENT_AMBULATORY_CARE_PROVIDER_SITE_OTHER): Payer: Medicare HMO | Admitting: Nurse Practitioner

## 2020-09-13 VITALS — BP 138/84 | HR 79 | Temp 97.2°F | Ht 64.5 in | Wt 207.0 lb

## 2020-09-13 DIAGNOSIS — R319 Hematuria, unspecified: Secondary | ICD-10-CM | POA: Diagnosis not present

## 2020-09-13 DIAGNOSIS — K7460 Unspecified cirrhosis of liver: Secondary | ICD-10-CM | POA: Diagnosis not present

## 2020-09-13 LAB — URINALYSIS, MICROSCOPIC ONLY
Bacteria, UA: NONE SEEN /HPF
Hyaline Cast: NONE SEEN /LPF
RBC / HPF: NONE SEEN /HPF (ref 0–2)

## 2020-09-13 LAB — URINALYSIS, ROUTINE W REFLEX MICROSCOPIC
Bilirubin Urine: NEGATIVE
Glucose, UA: NEGATIVE
Hgb urine dipstick: NEGATIVE
Ketones, ur: NEGATIVE
Leukocytes,Ua: NEGATIVE
Nitrite: NEGATIVE
Protein, ur: NEGATIVE
Specific Gravity, Urine: 1.015 (ref 1.001–1.03)
pH: 5.5 (ref 5.0–8.0)

## 2020-09-13 NOTE — Progress Notes (Signed)
Subjective:    Patient ID: Kelly [name], female    DOB: 10-21-1950, 70 y.o.   MRN: [id number]  HPI: Kelly [name] is a 70 y.o. female presenting for hematuria.  Chief Complaint  Patient presents with  . Hematuria    Blood in urine started 1 wk ago. Also having stomach and back aching. Given sulfamethoxazole taking bid, med finished   HEMATURIA Not seeing as much now; took full course of Bactrim.  She "believes the infection is still there."  When she was finishing up the medication, she did not see much blood. She is still having cramping in her stomach at times.  Thinks blood in urine is worse in the morning Duration: weeks -1 Gross hematuria: last saw Sunday Microscopic hematuria: no Dipstick hematuria: no  Onset: transient Circumstances of discovering hematuria: thinks UTI Proteinuria: no Red cell casts: no Dark brown/cola colored urine: no Clots in urine: no Recent urine culture: no Dysuria/urinary frequency: no Urinary incontinence: no Urinary hesistancy/dribbling: yes Back pain:no Suprapubic pain/pressure: no Flank pain: no Recent URI: no Recent vigorous exercise/trauma: no Status: better  URINARY SYMPTOMS Duration: weeks Dysuria: no Urinary frequency: yes Urgency: yes Small volume voids: sometimes Symptom severity: moderate Urinary incontinence: some dribbling that is new Foul odor: no Hematuria: yes Abdominal pain: yes Back pain: no Suprapubic pain/pressure: yes Flank pain: no Fever:  No Nausea: no Vomiting: no Relief with cranberry juice: no Relief with pyridium: no Status: stable Previous urinary tract infection: yes Recurrent urinary tract infection: no Sexual activity: Not sexually active History of sexually transmitted disease: none known  Vaginal discharge: white; started before Treatments attempted: antibiotics    Allergies  Allergen Reactions  . Prednisone Itching    Heart rate increased  . Celecoxib Rash  . Macrodantin  [Nitrofurantoin Macrocrystal] Rash    Outpatient Encounter Medications as of 09/13/2020  Medication Sig  . ACCU-CHEK AVIVA PLUS test strip   . ACCU-CHEK SOFTCLIX LANCETS lancets   . amLODipine (NORVASC) 10 MG tablet TAKE 1 TABLET BY MOUTH EVERY DAY  . aspirin EC 81 MG tablet Take 1 tablet (81 mg total) by mouth daily.  Marland Kitchen atorvastatin (LIPITOR) 20 MG tablet TAKE 1 TABLET EVERY DAY  . Blood Glucose Monitoring Suppl (ACCU-CHEK AVIVA PLUS) w/Device KIT   . clindamycin (CLEOCIN) 300 MG capsule   . clonazePAM (KLONOPIN) 0.5 MG tablet Take 1 tablet (0.5 mg total) by mouth 2 (two) times daily as needed for anxiety (use sparingly).  . FEROSUL 325 (65 Fe) MG tablet TAKE 1 TABLET (325 MG TOTAL) BY MOUTH DAILY.  . furosemide (LASIX) 20 MG tablet TAKE 1 TABLET EVERY DAY  . glipiZIDE (GLUCOTROL XL) 10 MG 24 hr tablet Take 1 tablet (10 mg total) by mouth daily with breakfast.  . HYDROcodone-acetaminophen (NORCO) 5-325 MG tablet Take 1 tablet by mouth every 6 (six) hours as needed for moderate pain.  Marland Kitchen levothyroxine (SYNTHROID) 75 MCG tablet TAKE 1 TABLET EVERY DAY  . losartan (COZAAR) 100 MG tablet TAKE 1 TABLET EVERY DAY  . metFORMIN (GLUCOPHAGE-XR) 500 MG 24 hr tablet TAKE 2 TABLETS TWICE DAILY  . nystatin (MYCOSTATIN) 100000 UNIT/ML suspension Take 5 mLs (500,000 Units total) by mouth 4 (four) times daily.  . pantoprazole (PROTONIX) 40 MG tablet TAKE 1 TABLET EVERY DAY  . pioglitazone (ACTOS) 30 MG tablet Take 1 tablet (30 mg total) by mouth daily.  . potassium chloride (KLOR-CON) 10 MEQ tablet TAKE 2 TABLETS (20 MEQ TOTAL) BY MOUTH DAILY.  . [  DISCONTINUED] sulfamethoxazole-trimethoprim (BACTRIM DS) 800-160 MG tablet Take 1 tablet by mouth 2 (two) times daily.   Facility-Administered Encounter Medications as of 09/13/2020  Medication  . cyanocobalamin ((VITAMIN B-12)) injection 1,000 mcg    Patient Active Problem List   Diagnosis Date Noted  . Acute on chronic diastolic heart failure ([place])  04/21/2019  . LBBB (left bundle branch block) 04/21/2019  . 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 Obesity   . NASH (nonalcoholic steatohepatitis)   . Tobacco abuse   . Coronary artery disease involving native coronary artery without angina pectoris   . Incidental pulmonary nodule, greater than or equal to 46m 10/14/2018  . Severe aortic stenosis   . Esophageal varices determined by endoscopy ([facility]   . Polyp of transverse colon   . Esophageal varices without bleeding ([place]   . Portal hypertensive gastropathy (HP[place]   . Iron deficiency anemia 03/06/2018  . Vitamin B 12 deficiency 09/16/2016  . Dementia ([facility] 09/16/2016  . Splenomegaly 09/16/2016  . Liver cirrhosis secondary to NASH (HL[place] 09/16/2016  . Thrombocytopenia (HP[place] 09/15/2016  . DM (diabetes mellitus), type 2 ([facility] 09/15/2016  . Hypertension 09/14/2016  . Depression 09/14/2016  . DIVERTICULITIS OF COLON 06/14/2008  . Irritable bowel syndrome 04/06/2008    Past Medical History:  Diagnosis Date  . Anxiety   . Chronic upper GI bleeding    intestinal AVM's, portal gastropathy, esophageal varices on egd 04/2018  . Coronary artery disease involving native coronary artery without angina pectoris   . Dementia ([facility] 09/16/2016  . Depression   . DIVERTICULITIS OF COLON 06/14/2008   Qualifier: Diagnosis of  By: LMat Carne   . DM (diabetes mellitus), type 2 (HR[place] 09/15/2016  . Esophageal varices determined by endoscopy ([place]Not[place]   . Hyperlipidemia   . Hypertension   . Hypothyroidism   . Incidental pulmonary nodule, greater than or equal to 828m3/06/2019   LLL nodule needs f/u CT or PET-CT scan  . Irritable bowel syndrome 04/06/2008   Centricity Description: IBS Qualifier: Diagnosis of  By: LeBobby RumpfMA (AADeborra Medina [name]   Centricity Description: IRRITABLE BOWEL SYNDROME Qualifier: Diagnosis of  By: JaArdis HughsD, DaMelene Plan . Liver cirrhosis secondary to NASH (HCLamont2/07/2017  . NASH (nonalcoholic steatohepatitis)    . Obesity   . Portal hypertensive gastropathy (HCKappa  . 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 04/20/19: s/p TAVR wtih a 2368medtronic CoreValve Evolut Pro via the TF approach  . Severe aortic stenosis   . Splenomegaly 09/16/2016  . Thrombocytopenia (HCC[facility] . Thyroid disease   . Tobacco abuse   . Vitamin D deficiency     Relevant past medical, surgical, family and social history reviewed and updated as indicated. Interim medical history since our last visit reviewed.  Review of Systems Per HPI unless specifically indicated above     Objective:    BP 138/84   Pulse 79   Temp (!) 97.2 F (36.2 C)   Ht 5' 4.5" (1.638 m)   Wt 207 lb (93.9 kg)   SpO2 96%   BMI 34.98 kg/m   Wt Readings from Last 3 Encounters:  09/13/20 207 lb (93.9 kg)  06/07/20 209 lb 3.2 oz (94.9 kg)  06/01/20 206 lb 6.4 oz (93.6 kg)    Physical Exam Vitals and nursing note reviewed.  Constitutional:      General: She is not in acute distress.    Appearance: Normal appearance. She  is obese. She is not toxic-appearing.  HENT:     Head: Normocephalic and atraumatic.  Eyes:     General: No scleral icterus.    Extraocular Movements: Extraocular movements intact.  Abdominal:     General: Abdomen is flat. Bowel sounds are normal. There is no distension or abdominal bruit.     Palpations: Abdomen is soft. There is no mass.     Tenderness: There is no abdominal tenderness. There is no right CVA tenderness or left CVA tenderness.  Musculoskeletal:        General: Normal range of motion.     Right lower leg: No edema.     Left lower leg: No edema.  Skin:    General: Skin is warm and dry.     Capillary Refill: Capillary refill takes less than 2 seconds.     Coloration: Skin is not jaundiced or pale.     Findings: No lesion.  Neurological:     Mental Status: She is alert. Mental status is at baseline.  Psychiatric:        Mood and Affect: Mood normal.        Behavior: Behavior  normal.        Thought Content: Thought content normal.        Judgment: Judgment normal.       Assessment & Plan:  1. Hematuria, unspecified type Acute, resolved.  No evidence of blood on dipstick or under microscopic examination today.  Will send urine for culture to ensure no acute infection remains.  In meantime, start bowel regimen, like Miralax or Metamucil, and let us know if abdominal cramping symptoms persist.   - Urinalysis, Routine w reflex microscopic - Urine Culture - Urinalysis, microscopic only    Follow up plan: Return if symptoms worsen or fail to improve.

## 2020-09-14 MED ORDER — HYDROCODONE-ACETAMINOPHEN 5-325 MG PO TABS: 1.0000 | ORAL_TABLET | Freq: Four times a day (QID) | ORAL | 0 refills | Status: DC | PRN

## 2020-09-15 LAB — URINE CULTURE
MICRO NUMBER:: 11514016
SPECIMEN QUALITY:: ADEQUATE

## 2020-09-18 ENCOUNTER — Other Ambulatory Visit: Payer: Self-pay

## 2020-09-18 MED ORDER — CIPROFLOXACIN HCL 500 MG PO TABS: 500.0000 mg | ORAL_TABLET | Freq: Two times a day (BID) | ORAL | 0 refills | Status: DC

## 2020-09-20 ENCOUNTER — Telehealth: Payer: Self-pay | Admitting: Family Medicine

## 2020-09-20 NOTE — Telephone Encounter (Signed)
Pt called stating that she was afraid to take the antibiotic ciprofloxacin (CIPRO) 500 MG tablet for her UTI, she was wondering if there was another type of antibiotic that she could take. Please call back as soon as possible to let her know if a new med has been ordered for her.  CB#[phone]

## 2020-09-20 NOTE — Telephone Encounter (Signed)
Call placed to patient to inquire.   Reports that she picked up ABTx today and read insert from pharmacy on Cipro. Reports that she is concerned due to possible SE (tendonitis). States that she would prefer another medication.   Please advise.

## 2020-09-21 ENCOUNTER — Other Ambulatory Visit: Payer: Self-pay | Admitting: Family Medicine

## 2020-09-21 MED ORDER — CEPHALEXIN 500 MG PO CAPS: 500.0000 mg | ORAL_CAPSULE | Freq: Three times a day (TID) | ORAL | 0 refills | Status: DC

## 2020-09-21 NOTE — Telephone Encounter (Signed)
Sent in keflex.

## 2020-10-05 ENCOUNTER — Ambulatory Visit: Payer: Medicare HMO | Admitting: Family Medicine

## 2020-10-10 ENCOUNTER — Ambulatory Visit: Payer: Medicare HMO | Admitting: Family Medicine

## 2020-10-13 ENCOUNTER — Other Ambulatory Visit: Payer: Self-pay

## 2020-10-13 ENCOUNTER — Ambulatory Visit (INDEPENDENT_AMBULATORY_CARE_PROVIDER_SITE_OTHER): Payer: Medicare HMO | Admitting: Family Medicine

## 2020-10-13 DIAGNOSIS — I2510 Atherosclerotic heart disease of native coronary artery without angina pectoris: Secondary | ICD-10-CM | POA: Diagnosis not present

## 2020-10-13 DIAGNOSIS — K7581 Nonalcoholic steatohepatitis (NASH): Secondary | ICD-10-CM | POA: Diagnosis not present

## 2020-10-13 DIAGNOSIS — R1030 Lower abdominal pain, unspecified: Secondary | ICD-10-CM | POA: Diagnosis not present

## 2020-10-13 DIAGNOSIS — E1169 Type 2 diabetes mellitus with other specified complication: Secondary | ICD-10-CM | POA: Diagnosis not present

## 2020-10-13 DIAGNOSIS — I5033 Acute on chronic diastolic (congestive) heart failure: Secondary | ICD-10-CM | POA: Diagnosis not present

## 2020-10-13 DIAGNOSIS — Z952 Presence of prosthetic heart valve: Secondary | ICD-10-CM

## 2020-10-13 DIAGNOSIS — E038 Other specified hypothyroidism: Secondary | ICD-10-CM

## 2020-10-13 LAB — URINALYSIS, ROUTINE W REFLEX MICROSCOPIC
Bacteria, UA: NONE SEEN /HPF
Bilirubin Urine: NEGATIVE
Glucose, UA: NEGATIVE
Hgb urine dipstick: NEGATIVE
Hyaline Cast: NONE SEEN /LPF
Ketones, ur: NEGATIVE
Nitrite: NEGATIVE
Protein, ur: NEGATIVE
RBC / HPF: NONE SEEN /HPF (ref 0–2)
Specific Gravity, Urine: 1.004 (ref 1.001–1.03)
pH: 5.5 (ref 5.0–8.0)

## 2020-10-13 LAB — MICROSCOPIC MESSAGE

## 2020-10-13 NOTE — Progress Notes (Signed)
Subjective:    Patient ID: Kelly [name], female    DOB: 14-Apr-1951, 70 y.o.   MRN: [id number]  HPI  Was seen by my partner February 9.  Initial urinalysis was negative for any infection however urine culture returned showing E. coli that was resistant to numerous antibiotics but was sensitive to Cipro.  My partner treated her with Cipro 500 mg p.o. twice daily for 3 days, 6 tablets.  Patient is here today to follow-up.  She also has numerous other medical problems including diabetes mellitus type 2.  She has not had lab work in over 6 months.  She has a history of hypothyroidism.  Her last TSH was checked in September of last year.  She has a history of coronary artery disease.  She is overdue for a fasting lipid panel.  She also has a history of cirrhosis due to [place] as well as some mild underlying age-related memory loss.  She also has a history of a aortic valve replacement.  She is long overdue for follow-up lab work.  However she is here today to follow-up the visit she recently had with my partner.  She states that she was having lower abdominal discomfort on a daily basis.  She describes it as a dull cramp-like pain in her right and left lower quadrants that was present all throughout the day.  She also reported increased frequency of urination as well as "sensitivity" whenever she would urinate although it was not truly burning.  She also thought that she Riddle see some faint blood in her urine when she would urinate.  After she took the Cipro, the symptoms got better for several days however they have returned.  She continues to have a dull lower abdominal discomfort that comes and goes intermittently.  Its located in her right and her left lower quadrants.  There is no exacerbating or alleviating factors.  She has a difficult time describing the characteristics of the pain.  The closest thing she can equated to a similar to menstrual cramps.  She denies any dysuria.  She denies any urgency.  She  denies any gross hematuria.  She denies any fevers or chills or nausea or vomiting.  She denies any constipation.  She denies any diarrhea.  She is slightly tender to palpation in the right lower quadrant today Past Medical History:  Diagnosis Date  . Anxiety   . Chronic upper GI bleeding    intestinal AVM's, portal gastropathy, esophageal varices on egd 04/2018  . Coronary artery disease involving native coronary artery without angina pectoris   . Dementia ([place]) 09/16/2016  . Depression   . DIVERTICULITIS OF COLON 06/14/2008   Qualifier: Diagnosis of  By: [name]    . DM (diabetes mellitus), type 2 ([place]) 09/15/2016  . Esophageal varices determined by endoscopy ([place])   . Hyperlipidemia   . Hypertension   . Hypothyroidism   . Incidental pulmonary nodule, greater than or equal to 74m 10/14/2018   LLL nodule needs f/u CT or PET-CT scan  . Irritable bowel syndrome 04/06/2008   Centricity Description: IBS Qualifier: Diagnosis of  By: [name] (ADeborra Medina, [name]   Centricity Description: IRRITABLE BOWEL SYNDROME Qualifier: Diagnosis of  By: [name], [name]  . Liver cirrhosis secondary to NASH (HCon[place] 09/16/2016  . NASH (nonalcoholic steatohepatitis)   . Obesity   . Portal hypertensive gastropathy (HR[place]   . 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 04/20/19: s/p TAVR  wtih a 79m Medtronic CoreValve Evolut Pro via the TF approach  . Severe aortic stenosis   . Splenomegaly 09/16/2016  . Thrombocytopenia (HBoyd   . Thyroid disease   . Tobacco abuse   . Vitamin D deficiency    Past Surgical History:  Procedure Laterality Date  . CARDIAC CATHETERIZATION    . COLONOSCOPY WITH PROPOFOL N/A 04/09/2018   Procedure: COLONOSCOPY WITH PROPOFOL;  Surgeon: JMilus Banister MD;  Location: [facility];  Service: Endoscopy;  Laterality: N/A;  . CORONARY ANGIOPLASTY    . CORONARY STENT INTERVENTION N/A 02/11/2019   Procedure: CORONARY STENT INTERVENTION;  Surgeon: MBurnell Blanks MD;  [facility];  Service: Cardiovascular;  Laterality: N/A;  Prox RCA Prox LAD  . ESOPHAGOGASTRODUODENOSCOPY (EGD) WITH PROPOFOL N/A 04/09/2018   Procedure: ESOPHAGOGASTRODUODENOSCOPY (EGD) WITH PROPOFOL;  Surgeon: JMilus Banister MD;  Location: [facility];  Service: Endoscopy;  Laterality: N/A;  . oopherectomy    . OVARIAN CYST REMOVAL    . POLYPECTOMY  04/09/2018   Procedure: POLYPECTOMY;  Surgeon: JMilus Banister MD;  Location: [facility];  Service: Endoscopy;;  . RIGHT/LEFT HEART CATH AND CORONARY ANGIOGRAPHY N/A 10/07/2018   Procedure: RIGHT/LEFT HEART CATH AND CORONARY ANGIOGRAPHY;  Surgeon: MBurnell Blanks MD;  [facility];  Service: Cardiovascular;  Laterality: N/A;  . TEE WITHOUT CARDIOVERSION N/A 04/20/2019   Procedure: TRANSESOPHAGEAL ECHOCARDIOGRAM (TEE);  Surgeon: MBurnell Blanks MD;  [facility]  Service: Open Heart Surgery;  Laterality: N/A;  . TRANSCATHETER AORTIC VALVE REPLACEMENT, TRANSFEMORAL N/A 04/20/2019   Procedure: TRANSCATHETER AORTIC VALVE REPLACEMENT, TRANSFEMORAL approach using 268mEvolut PRO+ medtronic valve;  Surgeon: McBurnell BlanksMD;  [facility] Service: Open Heart Surgery;  Laterality: N/A;   Current Outpatient Medications on File Prior to Visit  Medication Sig Dispense Refill  . ciprofloxacin (CIPRO) 500 MG tablet Take 1 tablet (500 mg total) by mouth 2 (two) times daily. 6 tablet 0  . ACCU-CHEK AVIVA PLUS test strip     . ACCU-CHEK SOFTCLIX LANCETS lancets     . amLODipine (NORVASC) 10 MG tablet TAKE 1 TABLET BY MOUTH EVERY DAY 90 tablet 3  . aspirin EC 81 MG tablet Take 1 tablet (81 mg total) by mouth daily. 90 tablet 3  . atorvastatin (LIPITOR) 20 MG tablet TAKE 1 TABLET EVERY DAY 90 tablet 3  . Blood Glucose Monitoring Suppl (ACCU-CHEK AVIVA PLUS) w/Device KIT     . cephALEXin (KEFLEX) 500 MG capsule Take 1 capsule (500 mg total) by mouth 3 (three) times daily. 15  capsule 0  . clindamycin (CLEOCIN) 300 MG capsule     . clonazePAM (KLONOPIN) 0.5 MG tablet Take 1 tablet (0.5 mg total) by mouth 2 (two) times daily as needed for anxiety (use sparingly). 30 tablet 0  . FEROSUL 325 (65 Fe) MG tablet TAKE 1 TABLET (325 MG TOTAL) BY MOUTH DAILY. 90 tablet 3  . furosemide (LASIX) 20 MG tablet TAKE 1 TABLET EVERY DAY 90 tablet 1  . glipiZIDE (GLUCOTROL XL) 10 MG 24 hr tablet Take 1 tablet (10 mg total) by mouth daily with breakfast. 90 tablet 0  . HYDROcodone-acetaminophen (NORCO) 5-325 MG tablet Take 1 tablet by mouth every 6 (six) hours as needed for moderate pain. 30 tablet 0  . levothyroxine (SYNTHROID) 75 MCG tablet TAKE 1 TABLET EVERY DAY 90 tablet 3  . losartan (COZAAR) 100 MG tablet TAKE 1 TABLET EVERY DAY 90 tablet 1  .  metFORMIN (GLUCOPHAGE-XR) 500 MG 24 hr tablet TAKE 2 TABLETS TWICE DAILY 360 tablet 1  . nystatin (MYCOSTATIN) 100000 UNIT/ML suspension Take 5 mLs (500,000 Units total) by mouth 4 (four) times daily. 60 mL 0  . pantoprazole (PROTONIX) 40 MG tablet TAKE 1 TABLET EVERY DAY 90 tablet 3  . pioglitazone (ACTOS) 30 MG tablet Take 1 tablet (30 mg total) by mouth daily. 90 tablet 3  . potassium chloride (KLOR-CON) 10 MEQ tablet TAKE 2 TABLETS (20 MEQ TOTAL) BY MOUTH DAILY. 180 tablet 3   Current Facility-Administered Medications on File Prior to Visit  Medication Dose Route Frequency Provider Last Rate Last Admin  . cyanocobalamin ((VITAMIN B-12)) injection 1,000 mcg  1,000 mcg Subcutaneous Q30 days [name], MD   1,000 mcg at 09/08/17 1408    Allergies  Allergen Reactions  . Prednisone Itching    Heart rate increased  . Celecoxib Rash  . Macrodantin [Nitrofurantoin Macrocrystal] Rash   Social History   Socioeconomic History  . Marital status: Married    Spouse name: Not on file  . Number of children: 1  . Years of education: Not on file  . Highest education level: Not on file  Occupational History  . Not on file  Tobacco  Use  . Smoking status: Current Every Day Smoker    Packs/day: 0.50    Types: Cigarettes  . Smokeless tobacco: Never Used  Vaping Use  . Vaping Use: Never used  Substance and Sexual Activity  . Alcohol use: No  . Drug use: Not Currently    Types: Benzodiazepines  . Sexual activity: Not Currently  Other Topics Concern  . Not on file  Social History Narrative  . Not on file   Social Determinants of Health   Financial Resource Strain: Not on file  Food Insecurity: Not on file  Transportation Needs: Not on file  Physical Activity: Not on file  Stress: Not on file  Social Connections: Not on file  Intimate Partner Violence: Not on file       Review of Systems  All other systems reviewed and are negative.      Objective:   Physical Exam Vitals reviewed.  Constitutional:      General: She is not in acute distress.    Appearance: She is obese. She is not ill-appearing, toxic-appearing or diaphoretic.  Cardiovascular:     Rate and Rhythm: Normal rate and regular rhythm.     Heart sounds: Murmur heard.    Pulmonary:     Effort: Pulmonary effort is normal. No respiratory distress.     Breath sounds: Normal breath sounds. No wheezing, rhonchi or rales.  Abdominal:     General: Bowel sounds are normal. There is no distension.     Palpations: Abdomen is soft. There is no mass.     Tenderness: There is no abdominal tenderness. There is no right CVA tenderness, left CVA tenderness, guarding or rebound.    Musculoskeletal:     Right lower leg: No edema.     Left lower leg: No edema.  Neurological:     Mental Status: She is alert.           Assessment & Plan:  Type 2 diabetes mellitus with other specified complication, without long-term current use of insulin (HCC) - Plan: Hemoglobin A1c, CBC with Differential/Platelet, COMPLETE METABOLIC PANEL WITH GFR, Lipid panel  Coronary artery disease involving native coronary artery of native heart without angina  pectoris  S/P TAVR (transcatheter aortic  valve replacement)  Other specified hypothyroidism - Plan: TSH  Acute on chronic diastolic heart failure (HCC)  NASH (nonalcoholic steatohepatitis)  Lower abdominal pain - Plan: Urinalysis, Routine w reflex microscopic, Urine Culture  Urine culture did show E. coli at her last visit.  However Cipro should have treated this.  The patient did see benefit while taking the Cipro but the symptoms came back.  Therefore I would like to repeat urinalysis and urine culture.  If urine culture continues to show E. coli bacteriuria, we [place] want to do a prolonged course of Cipro for 7 days and see if this helps her pain.  However her pain seems very nondistinct.  Honestly more the pain sounds like intestinal spasms and intestinal cramps than any type of UTI.  She does have a history of irritable bowel syndrome.  Also on the differential diagnosis would be some type of colitis however I favor irritable bowel syndrome given the fact the symptoms have been present now for more than 4 weeks and seem to come and go and fluctuate.  I would like to obtain baseline lab work on the patient as it is been quite some time including a CBC, CMP, lipid panel, TSH, A1c.  If her labs are outstanding, I would favor treating IBS.  If there is an elevated white blood cell count or evidence of inflammation, I would recommend a CT scan of the abdomen and pelvis to evaluate further assuming the urine culture shows no residual infection.

## 2020-10-14 LAB — COMPLETE METABOLIC PANEL WITH GFR
AG Ratio: 1.6 (calc) (ref 1.0–2.5)
ALT: 12 U/L (ref 6–29)
AST: 21 U/L (ref 10–35)
Albumin: 3.9 g/dL (ref 3.6–5.1)
Alkaline phosphatase (APISO): 101 U/L (ref 37–153)
BUN: 9 mg/dL (ref 7–25)
CO2: 24 mmol/L (ref 20–32)
Calcium: 9.7 mg/dL (ref 8.6–10.4)
Chloride: 109 mmol/L (ref 98–110)
Creat: 0.67 mg/dL (ref 0.60–0.93)
GFR, Est African American: 102 mL/min/{1.73_m2} (ref >=60)
GFR, Est Non African American: 88 mL/min/{1.73_m2} (ref >=60)
Globulin: 2.5 g/dL (calc) (ref 1.9–3.7)
Glucose, Bld: 119 mg/dL — ABNORMAL HIGH (ref 65–99)
Potassium: 4.1 mmol/L (ref 3.5–5.3)
Sodium: 141 mmol/L (ref 135–146)
Total Bilirubin: 0.8 mg/dL (ref 0.2–1.2)
Total Protein: 6.4 g/dL (ref 6.1–8.1)

## 2020-10-14 LAB — HEMOGLOBIN A1C
Hgb A1c MFr Bld: 5.6 % of total Hgb (ref <5.7)
Mean Plasma Glucose: 114 mg/dL
eAG (mmol/L): 6.3 mmol/L

## 2020-10-14 LAB — CBC WITH DIFFERENTIAL/PLATELET
Absolute Monocytes: 627 cells/uL (ref 200–950)
Basophils Absolute: 31 cells/uL (ref 0–200)
Basophils Relative: 0.6 %
Eosinophils Absolute: 143 cells/uL (ref 15–500)
Eosinophils Relative: 2.8 %
HCT: 34.9 % — ABNORMAL LOW (ref 35.0–45.0)
Hemoglobin: 11.9 g/dL (ref 11.7–15.5)
Lymphs Abs: 1311 cells/uL (ref 850–3900)
MCH: 28.6 pg (ref 27.0–33.0)
MCHC: 34.1 g/dL (ref 32.0–36.0)
MCV: 83.9 fL (ref 80.0–100.0)
MPV: 11.7 fL (ref 7.5–12.5)
Monocytes Relative: 12.3 %
Neutro Abs: 2989 cells/uL (ref 1500–7800)
Neutrophils Relative %: 58.6 %
Platelets: 70 10*3/uL — ABNORMAL LOW (ref 140–400)
RBC: 4.16 10*6/uL (ref 3.80–5.10)
RDW: 14.7 % (ref 11.0–15.0)
Total Lymphocyte: 25.7 %
WBC: 5.1 10*3/uL (ref 3.8–10.8)

## 2020-10-14 LAB — LIPID PANEL
Cholesterol: 130 mg/dL (ref <200)
HDL: 45 mg/dL — ABNORMAL LOW (ref >=50)
LDL Cholesterol (Calc): 63 mg/dL (calc)
Non-HDL Cholesterol (Calc): 85 mg/dL (calc) (ref <130)
Total CHOL/HDL Ratio: 2.9 (calc) (ref <5.0)
Triglycerides: 136 mg/dL (ref <150)

## 2020-10-14 LAB — TSH: TSH: 3.04 mIU/L (ref 0.40–4.50)

## 2020-10-15 LAB — URINE CULTURE
MICRO NUMBER:: 11637529
SPECIMEN QUALITY:: ADEQUATE

## 2020-10-17 ENCOUNTER — Other Ambulatory Visit: Payer: Self-pay

## 2020-10-17 ENCOUNTER — Other Ambulatory Visit: Payer: Self-pay | Admitting: *Deleted

## 2020-10-17 DIAGNOSIS — N390 Urinary tract infection, site not specified: Secondary | ICD-10-CM

## 2020-10-17 MED ORDER — SULFAMETHOXAZOLE-TRIMETHOPRIM 800-160 MG PO TABS: 1.0000 | ORAL_TABLET | Freq: Two times a day (BID) | ORAL | 0 refills | Status: DC

## 2020-10-30 ENCOUNTER — Other Ambulatory Visit: Payer: Self-pay | Admitting: Family Medicine

## 2020-11-01 ENCOUNTER — Other Ambulatory Visit: Payer: Self-pay | Admitting: Family Medicine

## 2020-11-03 ENCOUNTER — Telehealth: Payer: Self-pay | Admitting: Family Medicine

## 2020-11-03 NOTE — Telephone Encounter (Signed)
Patient's spouse called to follow up on request for refill of zolpiden. Please advise patient at [phone] when Rx called in.

## 2020-11-07 ENCOUNTER — Other Ambulatory Visit: Payer: Self-pay

## 2020-11-07 MED ORDER — ZOLPIDEM TARTRATE 10 MG PO TABS: 10.0000 mg | ORAL_TABLET | Freq: Every evening | ORAL | 0 refills | Status: DC | PRN

## 2020-11-09 ENCOUNTER — Other Ambulatory Visit: Payer: Self-pay | Admitting: *Deleted

## 2020-11-13 ENCOUNTER — Other Ambulatory Visit: Payer: Self-pay

## 2020-11-13 ENCOUNTER — Telehealth: Payer: Self-pay

## 2020-11-13 MED ORDER — ZOLPIDEM TARTRATE 10 MG PO TABS: 10.0000 mg | ORAL_TABLET | Freq: Every evening | ORAL | 0 refills | Status: DC | PRN

## 2020-11-13 MED ORDER — ZOLPIDEM TARTRATE 10 MG PO TABS
10.0000 mg | ORAL_TABLET | Freq: Every evening | ORAL | 0 refills | Status: DC | PRN
Start: 2020-11-13 — End: 2020-11-21

## 2020-11-13 NOTE — Telephone Encounter (Signed)
ambien medication resent to the correct pharmacy

## 2020-11-21 ENCOUNTER — Other Ambulatory Visit: Payer: Self-pay | Admitting: *Deleted

## 2020-11-21 NOTE — Telephone Encounter (Signed)
Another message placed for refill.

## 2020-11-21 NOTE — Telephone Encounter (Signed)
Patient called to follow up on pharmacy update request: Rx for zolpidem (AMBIEN) 10 MG tablet [[id number]]    Pharmacy:   [facility]  [place], [place] [address]  Phone:  [phone] Fax:  [phone]   Pharmacy still has not received updated script. Patient having a hard time sleeping at night; doesn't go to sleep until around 5am. Spoke with nurse Christina Six; order being prepared for [place] to review. Patient aware provider out of office today and will return tomorrow.    Please send the Rx asap and contact the patient when the Rx has been sent at [phone].

## 2020-11-21 NOTE — Telephone Encounter (Signed)
Received call from patient.   Reports that she had requested refill on Ambien to mail order on 11/13/2020, but it was sent to retail.   Requesting refill to [facility] mail order.   Ok to re-send?

## 2020-11-22 MED ORDER — ZOLPIDEM TARTRATE 10 MG PO TABS: 10.0000 mg | ORAL_TABLET | Freq: Every evening | ORAL | 1 refills | Status: DC | PRN

## 2020-12-01 ENCOUNTER — Inpatient Hospital Stay: Payer: Medicare HMO | Admitting: Oncology

## 2020-12-01 ENCOUNTER — Inpatient Hospital Stay: Payer: Medicare HMO | Attending: Family Medicine

## 2020-12-04 ENCOUNTER — Ambulatory Visit: Payer: Medicare HMO | Admitting: Podiatry

## 2020-12-05 ENCOUNTER — Other Ambulatory Visit: Payer: Self-pay | Admitting: Family Medicine

## 2020-12-18 ENCOUNTER — Ambulatory Visit (INDEPENDENT_AMBULATORY_CARE_PROVIDER_SITE_OTHER): Payer: Medicare HMO | Admitting: Podiatry

## 2020-12-18 ENCOUNTER — Encounter: Payer: Self-pay | Admitting: Podiatry

## 2020-12-18 ENCOUNTER — Other Ambulatory Visit: Payer: Self-pay

## 2020-12-18 DIAGNOSIS — M79675 Pain in left toe(s): Secondary | ICD-10-CM | POA: Diagnosis not present

## 2020-12-18 DIAGNOSIS — E119 Type 2 diabetes mellitus without complications: Secondary | ICD-10-CM

## 2020-12-18 DIAGNOSIS — M79674 Pain in right toe(s): Secondary | ICD-10-CM

## 2020-12-18 DIAGNOSIS — D696 Thrombocytopenia, unspecified: Secondary | ICD-10-CM

## 2020-12-18 DIAGNOSIS — B351 Tinea unguium: Secondary | ICD-10-CM | POA: Diagnosis not present

## 2020-12-18 NOTE — Progress Notes (Signed)
This patient returns to my office for at risk foot care.  This patient requires this care by a professional since this patient will be at risk due to having thrombocytopenia and diabetes.  This patient is unable to cut nails herself since the patient cannot reach her nails.These nails are painful walking and wearing shoes.  This patient presents for at risk foot care today.  General Appearance  Alert, conversant and in no acute stress.  Vascular  Dorsalis pedis and posterior tibial  pulses are palpable  bilaterally.  Capillary return is within normal limits  bilaterally. Temperature is within normal limits  bilaterally.  Neurologic  Senn-Weinstein monofilament wire test within normal limits  bilaterally. Muscle power within normal limits bilaterally.  Nails Thick disfigured discolored nails with subungual debris  from hallux to fifth toes bilaterally. No evidence of bacterial infection or drainage bilaterally.  Orthopedic  No limitations of motion  feet .  No crepitus or effusions noted.  No bony pathology or digital deformities noted.  HAV  B/L  Skin  normotropic skin with no porokeratosis noted bilaterally.  No signs of infections or ulcers noted.     Onychomycosis  Pain in right toes  Pain in left toes  Consent was obtained for treatment procedures.   Mechanical debridement of nails 1-5  bilaterally performed with a nail nipper.  Filed with dremel without incident.    Return office visit  3 months                    Told patient to return for periodic foot care and evaluation due to potential at risk complications.   [name] DPM

## 2021-01-03 ENCOUNTER — Other Ambulatory Visit: Payer: Self-pay | Admitting: Family Medicine

## 2021-01-03 DIAGNOSIS — Z1231 Encounter for screening mammogram for malignant neoplasm of breast: Secondary | ICD-10-CM

## 2021-01-09 ENCOUNTER — Other Ambulatory Visit: Payer: Self-pay | Admitting: Family Medicine

## 2021-01-09 NOTE — Telephone Encounter (Signed)
Ok to refill??  Last office visit 10/13/2020.  Last refill 09/14/2020.

## 2021-01-09 NOTE — Telephone Encounter (Signed)
Patient called to request refill of HYDROcodone-acetaminophen (NORCO) 5-325 MG tablet [[id number]] for back pain (takes 1/2 tablet as needed for relief).   If appt needed before provider will give refill, patient requesting small quantity to tie her over until next appointment date of 6/16. Please advise at [phone].

## 2021-01-10 MED ORDER — HYDROCODONE-ACETAMINOPHEN 5-325 MG PO TABS: 1.0000 | ORAL_TABLET | Freq: Four times a day (QID) | ORAL | 0 refills | Status: DC | PRN

## 2021-01-10 NOTE — Telephone Encounter (Signed)
PDMP reviewed.  Appears this patient takes 1/2 tablet as needed for pain and has follow-up next week.  Last refill was roughly 4 months ago.  Refill given in place of PCP who is out of office.

## 2021-01-18 ENCOUNTER — Encounter: Payer: Self-pay | Admitting: Family Medicine

## 2021-01-18 ENCOUNTER — Other Ambulatory Visit: Payer: Self-pay

## 2021-01-18 ENCOUNTER — Ambulatory Visit (INDEPENDENT_AMBULATORY_CARE_PROVIDER_SITE_OTHER): Payer: Medicare HMO | Admitting: Family Medicine

## 2021-01-18 VITALS — BP 128/68 | HR 82 | Temp 98.7°F | Resp 14 | Ht 64.5 in | Wt 202.0 lb

## 2021-01-18 DIAGNOSIS — R1030 Lower abdominal pain, unspecified: Secondary | ICD-10-CM | POA: Diagnosis not present

## 2021-01-18 MED ORDER — HYDROCODONE-ACETAMINOPHEN 5-325 MG PO TABS: 1.0000 | ORAL_TABLET | Freq: Four times a day (QID) | ORAL | 0 refills | Status: DC | PRN

## 2021-01-18 NOTE — Progress Notes (Signed)
Subjective:    Patient ID: Kelly [name], female    DOB: 1951-03-09, 70 y.o.   MRN: [id number]  HPI  I saw the patient about 3 months ago for lower pelvic pain.  Please see my office visit from March 2022.  She states that she continues to have pain.  Is primarily in the left lower quadrant.  However she points to both the left lower quadrant and the right lower quadrant when she describes the location of the pain.  It is underneath her pannus.  It is reproducible with palpation.  She states that it occurs 1-2 times a week.  The pain will occur.  Will be intense.  It will hurt for about 24 to 48 hours.  It will then slowly subside.  She states that he will remain sore there for a day or 2 and then it will go away.  She denies any hematuria.  She denies any dysuria.  She denies any frequency or urgency.  She denies any melena.  She denies any hematochezia.  She states that she has a bowel movement at least every other day and this is normal for her.  She denies any nausea or vomiting.  She denies any vaginal bleeding.  She denies any vaginal discharge. Past Medical History:  Diagnosis Date   Anxiety    Chronic upper GI bleeding    intestinal AVM's, portal gastropathy, esophageal varices on egd 04/2018   Coronary artery disease involving native coronary artery without angina pectoris    Dementia ([place]) 09/16/2016   Depression    DIVERTICULITIS OF COLON 06/14/2008   Qualifier: Diagnosis of  By: Gretta Cool RN, Malachy Mood     DM (diabetes mellitus), type 2 ([place]) 09/15/2016   Esophageal varices determined by endoscopy ([place])    Hyperlipidemia    Hypertension    Hypothyroidism    Incidental pulmonary nodule, greater than or equal to 11m 10/14/2018   LLL nodule needs f/u CT or PET-CT scan   Irritable bowel syndrome 04/06/2008   Centricity Description: IBS Qualifier: Diagnosis of  By: [name] (ADeborra Medina, [name]   Centricity Description: IRRITABLE BOWEL SYNDROME Qualifier: Diagnosis of  By: [name], [name]   Liver  cirrhosis secondary to NASH ([place] 09/16/2016   NASH (nonalcoholic steatohepatitis)    Obesity    Portal hypertensive gastropathy ([place]    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 04/20/19: s/p TAVR wtih a 25mMedtronic CoreValve Evolut Pro via the TF approach   Severe aortic stenosis    Splenomegaly 09/16/2016   Thrombocytopenia ([place]   Thyroid disease    Tobacco abuse    Vitamin D deficiency    Past Surgical History:  Procedure Laterality Date   CARDIAC CATHETERIZATION     COLONOSCOPY WITH PROPOFOL N/A 04/09/2018   Procedure: COLONOSCOPY WITH PROPOFOL;  Surgeon: JaMilus BanisterMD;  [facility];  Service: Endoscopy;  Laterality: N/A;   CORONARY ANGIOPLASTY     CORONARY STENT INTERVENTION N/A 02/11/2019   Procedure: CORONARY STENT INTERVENTION;  Surgeon: McBurnell BlanksMD;  [facility];  Service: Cardiovascular;  Laterality: N/A;  Prox RCA Prox LAD   ESOPHAGOGASTRODUODENOSCOPY (EGD) WITH PROPOFOL N/A 04/09/2018   Procedure: ESOPHAGOGASTRODUODENOSCOPY (EGD) WITH PROPOFOL;  Surgeon: JaMilus BanisterMD;  [facility];  Service: Endoscopy;  Laterality: N/A;   oopherectomy     OVARIAN CYST REMOVAL     POLYPECTOMY  04/09/2018   Procedure: POLYPECTOMY;  Surgeon:  [name], MD;  Location: [facility];  Service: Endoscopy;;   RIGHT/LEFT HEART CATH AND CORONARY ANGIOGRAPHY N/A 10/07/2018   Procedure: RIGHT/LEFT HEART CATH AND CORONARY ANGIOGRAPHY;  Surgeon: [name], MD;  Location: [facility];  Service: Cardiovascular;  Laterality: N/A;   TEE WITHOUT CARDIOVERSION N/A 04/20/2019   Procedure: TRANSESOPHAGEAL ECHOCARDIOGRAM (TEE);  Surgeon: [name], MD;  Location: [facility];  Service: Open Heart Surgery;  Laterality: N/A;   TRANSCATHETER AORTIC VALVE REPLACEMENT, TRANSFEMORAL N/A 04/20/2019   Procedure: TRANSCATHETER AORTIC VALVE REPLACEMENT, TRANSFEMORAL approach using 49m Evolut PRO+  medtronic valve;  Surgeon: MBurnell Blanks MD;  [facility]  Service: Open Heart Surgery;  Laterality: N/A;   Current Outpatient Medications on File Prior to Visit  Medication Sig Dispense Refill   ACCU-CHEK AVIVA PLUS test strip      ACCU-CHEK SOFTCLIX LANCETS lancets      amLODipine (NORVASC) 10 MG tablet TAKE 1 TABLET BY MOUTH EVERY DAY 90 tablet 3   aspirin EC 81 MG tablet Take 1 tablet (81 mg total) by mouth daily. 90 tablet 3   atorvastatin (LIPITOR) 20 MG tablet TAKE 1 TABLET EVERY DAY 90 tablet 3   Blood Glucose Monitoring Suppl (ACCU-CHEK AVIVA PLUS) w/Device KIT      clonazePAM (KLONOPIN) 0.5 MG tablet Take 1 tablet (0.5 mg total) by mouth 2 (two) times daily as needed for anxiety (use sparingly). 30 tablet 0   FEROSUL 325 (65 Fe) MG tablet TAKE 1 TABLET EVERY DAY 90 tablet 3   furosemide (LASIX) 20 MG tablet TAKE 1 TABLET EVERY DAY 90 tablet 1   glipiZIDE (GLUCOTROL XL) 10 MG 24 hr tablet Take 1 tablet (10 mg total) by mouth daily with breakfast. 90 tablet 0   HYDROcodone-acetaminophen (NORCO) 5-325 MG tablet Take 1 tablet by mouth every 6 (six) hours as needed for moderate pain. 30 tablet 0   levothyroxine (SYNTHROID) 75 MCG tablet TAKE 1 TABLET EVERY DAY 90 tablet 3   losartan (COZAAR) 100 MG tablet TAKE 1 TABLET EVERY DAY 90 tablet 1   metFORMIN (GLUCOPHAGE-XR) 500 MG 24 hr tablet TAKE 2 TABLETS TWICE DAILY 360 tablet 1   nystatin (MYCOSTATIN) 100000 UNIT/ML suspension Take 5 mLs (500,000 Units total) by mouth 4 (four) times daily. 60 mL 0   pantoprazole (PROTONIX) 40 MG tablet TAKE 1 TABLET EVERY DAY 90 tablet 3   pioglitazone (ACTOS) 30 MG tablet Take 1 tablet (30 mg total) by mouth daily. 90 tablet 3   potassium chloride (KLOR-CON) 10 MEQ tablet TAKE 2 TABLETS (20 MEQ TOTAL) BY MOUTH DAILY. 180 tablet 3   potassium chloride (KLOR-CON) 10 MEQ tablet      sulfamethoxazole-trimethoprim (BACTRIM DS) 800-160 MG tablet Take 1 tablet by mouth 2 (two) times daily. 14  tablet 0   zolpidem (AMBIEN) 10 MG tablet Take 1 tablet (10 mg total) by mouth at bedtime as needed for sleep. 90 tablet 1   Current Facility-Administered Medications on File Prior to Visit  Medication Dose Route Frequency Provider Last Rate Last Admin   cyanocobalamin ((VITAMIN B-12)) injection 1,000 mcg  1,000 mcg Subcutaneous Q30 days PSusy Frizzle MD   1,000 mcg at 09/08/17 1408    Allergies  Allergen Reactions   Prednisone Itching    Heart rate increased   Celecoxib Rash   Macrodantin [Nitrofurantoin Macrocrystal] Rash   Social History   Socioeconomic History   Marital status: Married    Spouse name: Not on file  Number of children: 1   Years of education: Not on file   Highest education level: Not on file  Occupational History   Not on file  Tobacco Use   Smoking status: Every Day    Packs/day: 0.50    Pack years: 0.00    Types: Cigarettes   Smokeless tobacco: Never  Vaping Use   Vaping Use: Never used  Substance and Sexual Activity   Alcohol use: No   Drug use: Not Currently    Types: Benzodiazepines   Sexual activity: Not Currently  Other Topics Concern   Not on file  Social History Narrative   Not on file   Social Determinants of Health   Financial Resource Strain: Not on file  Food Insecurity: Not on file  Transportation Needs: Not on file  Physical Activity: Not on file  Stress: Not on file  Social Connections: Not on file  Intimate Partner Violence: Not on file       Review of Systems     Objective:   Physical Exam Constitutional:      Appearance: Normal appearance. She is obese.  Cardiovascular:     Rate and Rhythm: Normal rate and regular rhythm.     Heart sounds: Murmur heard.  Pulmonary:     Effort: Pulmonary effort is normal. No respiratory distress.     Breath sounds: Normal breath sounds. No wheezing, rhonchi or rales.  Abdominal:     General: Bowel sounds are normal.     Palpations: Abdomen is soft. There is no mass.      Tenderness: There is abdominal tenderness in the left lower quadrant. There is no guarding or rebound.    Neurological:     Mental Status: She is alert.          Assessment & Plan:  Lower abdominal pain - Plan: CT Abdomen Pelvis W Contrast, CBC with Differential/Platelet, COMPLETE METABOLIC PANEL WITH GFR Patient is obese.  It is difficult to appreciate any hernia or mass on exam but the exam is limited due to body habitus.  The location of the pain makes me suspicious for diverticulitis.  I question if she could be having diverticular pain or even intermittent intestinal spasms.  Proceed with a CT scan of the abdomen and pelvis to evaluate for diverticulitis given the fact the pain has been off and on occurring now for 3 months.  Check CBC and CMP as well

## 2021-01-19 LAB — CBC WITH DIFFERENTIAL/PLATELET
Absolute Monocytes: 596 cells/uL (ref 200–950)
Basophils Absolute: 34 cells/uL (ref 0–200)
Basophils Relative: 0.5 %
Eosinophils Absolute: 214 cells/uL (ref 15–500)
Eosinophils Relative: 3.2 %
HCT: 35.5 % (ref 35.0–45.0)
Hemoglobin: 12 g/dL (ref 11.7–15.5)
Lymphs Abs: 1668 cells/uL (ref 850–3900)
MCH: 29.1 pg (ref 27.0–33.0)
MCHC: 33.8 g/dL (ref 32.0–36.0)
MCV: 86.2 fL (ref 80.0–100.0)
MPV: 11.8 fL (ref 7.5–12.5)
Monocytes Relative: 8.9 %
Neutro Abs: 4188 cells/uL (ref 1500–7800)
Neutrophils Relative %: 62.5 %
Platelets: 76 10*3/uL — ABNORMAL LOW (ref 140–400)
RBC: 4.12 10*6/uL (ref 3.80–5.10)
RDW: 15.3 % — ABNORMAL HIGH (ref 11.0–15.0)
Total Lymphocyte: 24.9 %
WBC: 6.7 10*3/uL (ref 3.8–10.8)

## 2021-01-19 LAB — COMPLETE METABOLIC PANEL WITH GFR
AG Ratio: 1.4 (calc) (ref 1.0–2.5)
ALT: 13 U/L (ref 6–29)
AST: 20 U/L (ref 10–35)
Albumin: 3.9 g/dL (ref 3.6–5.1)
Alkaline phosphatase (APISO): 113 U/L (ref 37–153)
BUN: 9 mg/dL (ref 7–25)
CO2: 24 mmol/L (ref 20–32)
Calcium: 10.1 mg/dL (ref 8.6–10.4)
Chloride: 108 mmol/L (ref 98–110)
Creat: 0.64 mg/dL (ref 0.60–0.93)
GFR, Est African American: 103 mL/min/{1.73_m2} (ref >=60)
GFR, Est Non African American: 89 mL/min/{1.73_m2} (ref >=60)
Globulin: 2.8 g/dL (calc) (ref 1.9–3.7)
Glucose, Bld: 90 mg/dL (ref 65–99)
Potassium: 4.2 mmol/L (ref 3.5–5.3)
Sodium: 141 mmol/L (ref 135–146)
Total Bilirubin: 1 mg/dL (ref 0.2–1.2)
Total Protein: 6.7 g/dL (ref 6.1–8.1)

## 2021-01-23 ENCOUNTER — Encounter: Payer: Self-pay | Admitting: *Deleted

## 2021-02-07 ENCOUNTER — Other Ambulatory Visit: Payer: Medicare HMO

## 2021-02-09 ENCOUNTER — Other Ambulatory Visit: Payer: Self-pay | Admitting: Family Medicine

## 2021-02-09 MED ORDER — HYDROCODONE-ACETAMINOPHEN 5-325 MG PO TABS: 1.0000 | ORAL_TABLET | Freq: Four times a day (QID) | ORAL | 0 refills | Status: DC | PRN

## 2021-02-09 NOTE — Telephone Encounter (Signed)
Ok to refill??  Last office visit/ refill 01/18/2021.

## 2021-02-09 NOTE — Telephone Encounter (Signed)
Pt called in requesting a refill of  HYDROcodone-acetaminophen (NORCO) 5-325 MG tablet   Cb #: [phone]

## 2021-02-14 ENCOUNTER — Other Ambulatory Visit: Payer: Self-pay | Admitting: Family Medicine

## 2021-02-15 ENCOUNTER — Other Ambulatory Visit: Payer: Self-pay | Admitting: Family Medicine

## 2021-02-28 ENCOUNTER — Ambulatory Visit: Payer: Medicare HMO

## 2021-03-02 ENCOUNTER — Ambulatory Visit: Payer: Medicare HMO | Admitting: Family Medicine

## 2021-03-09 ENCOUNTER — Telehealth (INDEPENDENT_AMBULATORY_CARE_PROVIDER_SITE_OTHER): Payer: Medicare HMO | Admitting: Family Medicine

## 2021-03-09 DIAGNOSIS — F418 Other specified anxiety disorders: Secondary | ICD-10-CM

## 2021-03-09 MED ORDER — HYDROCODONE-ACETAMINOPHEN 5-325 MG PO TABS: 1.0000 | ORAL_TABLET | Freq: Four times a day (QID) | ORAL | 0 refills | Status: DC | PRN

## 2021-03-09 MED ORDER — CLONAZEPAM 0.5 MG PO TABS: 0.5000 mg | ORAL_TABLET | Freq: Two times a day (BID) | ORAL | 0 refills | Status: DC | PRN
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
## 2021-03-27 ENCOUNTER — Other Ambulatory Visit: Payer: Self-pay | Admitting: Family Medicine

## 2021-03-29 ENCOUNTER — Other Ambulatory Visit: Payer: Self-pay

## 2021-03-29 ENCOUNTER — Telehealth: Payer: Self-pay | Admitting: *Deleted

## 2021-03-29 ENCOUNTER — Other Ambulatory Visit: Payer: Medicare HMO

## 2021-03-29 ENCOUNTER — Other Ambulatory Visit: Payer: Self-pay | Admitting: Family Medicine

## 2021-03-29 ENCOUNTER — Other Ambulatory Visit: Payer: Self-pay | Admitting: *Deleted

## 2021-03-29 DIAGNOSIS — R300 Dysuria: Secondary | ICD-10-CM

## 2021-03-29 LAB — URINALYSIS, ROUTINE W REFLEX MICROSCOPIC
Ketones, ur: NEGATIVE
Nitrite: POSITIVE — AB
Specific Gravity, Urine: 1.02 (ref 1.001–1.035)
pH: 6 (ref 5.0–8.0)

## 2021-03-29 LAB — MICROSCOPIC MESSAGE

## 2021-03-29 MED ORDER — CEPHALEXIN 500 MG PO CAPS: 500.0000 mg | ORAL_CAPSULE | Freq: Three times a day (TID) | ORAL | 0 refills | Status: DC

## 2021-03-29 NOTE — Telephone Encounter (Signed)
Patient returned call.   Reports increased urination, pain with urination, and lower abdominal pain x1 week.   Orders placed. Patient will come to office to leave urine sample for testing.

## 2021-03-29 NOTE — Telephone Encounter (Signed)
Received following message: Patient has a schedule conflict for this afternoon's appt. She wants to know if she can come in early and leave a urine specimen to get treatment/medication.   Call placed to patient for more information.

## 2021-03-31 LAB — URINE CULTURE
MICRO NUMBER:: 12291028
SPECIMEN QUALITY:: ADEQUATE

## 2021-04-02 ENCOUNTER — Other Ambulatory Visit: Payer: Self-pay | Admitting: Family Medicine

## 2021-04-02 MED ORDER — SULFAMETHOXAZOLE-TRIMETHOPRIM 800-160 MG PO TABS: 1.0000 | ORAL_TABLET | Freq: Two times a day (BID) | ORAL | 0 refills | Status: DC

## 2021-04-16 ENCOUNTER — Other Ambulatory Visit: Payer: Self-pay | Admitting: Family Medicine

## 2021-04-16 MED ORDER — CLONAZEPAM 0.5 MG PO TABS: 0.5000 mg | ORAL_TABLET | Freq: Two times a day (BID) | ORAL | 0 refills | Status: DC | PRN

## 2021-04-16 NOTE — Telephone Encounter (Signed)
Ok to refill??  Last office visit/ refill 03/09/2021

## 2021-04-16 NOTE — Telephone Encounter (Signed)
Patient called to request refill of clonazePAM (KLONOPIN) 0.5 MG tablet [[id number]]   Pharmacy confirmed as   [id number] [address] Phone:  [phone] Fax:  [phone] DEA #:  [id number] Patient took last dose 04/13/2021  Please advise at [phone]

## 2021-05-01 ENCOUNTER — Telehealth: Payer: Self-pay | Admitting: Family Medicine

## 2021-05-01 MED ORDER — FUROSEMIDE 20 MG PO TABS: 20.0000 mg | ORAL_TABLET | Freq: Every day | ORAL | 1 refills | Status: DC

## 2021-05-01 NOTE — Telephone Encounter (Signed)
Call placed to patient.   Advised that patient is taking Lasix due to CHF. Advised that excess fluid can build up around heart and cause SOB or heart failure.   Advised to continue current dose.

## 2021-05-01 NOTE — Telephone Encounter (Signed)
Patient called to report increased frequency of urination when she takes furosemide (LASIX) 20 MG tablet [[id number]]   Wants to know if she can either take a lower dose or be advised by provider.   Please advise at [phone].

## 2021-05-04 ENCOUNTER — Telehealth: Payer: Self-pay | Admitting: Family Medicine

## 2021-05-04 NOTE — Progress Notes (Signed)
  Chronic Care Management   Note  05/04/2021 Name: Kelly [name] MRN: [id number] DOB: 02-Jun-1951  Kelly [name] is a 70 y.o. year old female who is a primary care patient of [name], MD. I reached out to [name] by phone today in response to a referral sent by Ms. Kelly Russel Kichline's PCP, [name], MD.   Ms. Prime was given information about Chronic Care Management services today including:  CCM service includes personalized support from designated clinical staff supervised by her physician, including individualized plan of care and coordination with other care providers 24/7 contact phone numbers for assistance for urgent and routine care needs. Service will only be billed when office clinical staff spend 20 minutes or more in a month to coordinate care. Only one practitioner [place] furnish and bill the service in a calendar month. The patient [name] stop CCM services at any time (effective at the end of the month) by phone call to the office staff.   Patient agreed to services and verbal consent obtained.   Follow up plan:   [name] Secretary/administrator

## 2021-05-07 ENCOUNTER — Other Ambulatory Visit: Payer: Self-pay | Admitting: Family Medicine

## 2021-05-10 ENCOUNTER — Telehealth: Payer: Self-pay | Admitting: Family Medicine

## 2021-05-10 NOTE — Telephone Encounter (Signed)
Patient was contacted by CCM.

## 2021-05-10 NOTE — Telephone Encounter (Signed)
Patient returned missed call received yesterday. Patient states message was unclear/had static; unsure about who called her. Please advise at [phone]. Patient will be home until about 11:15am. Ok to call her husband's cell number of [phone].

## 2021-05-11 ENCOUNTER — Ambulatory Visit (INDEPENDENT_AMBULATORY_CARE_PROVIDER_SITE_OTHER): Payer: Medicare HMO | Admitting: Podiatry

## 2021-05-11 ENCOUNTER — Encounter: Payer: Self-pay | Admitting: Podiatry

## 2021-05-11 ENCOUNTER — Other Ambulatory Visit: Payer: Self-pay

## 2021-05-11 DIAGNOSIS — D696 Thrombocytopenia, unspecified: Secondary | ICD-10-CM | POA: Diagnosis not present

## 2021-05-11 DIAGNOSIS — E119 Type 2 diabetes mellitus without complications: Secondary | ICD-10-CM

## 2021-05-11 DIAGNOSIS — M79675 Pain in left toe(s): Secondary | ICD-10-CM | POA: Diagnosis not present

## 2021-05-11 DIAGNOSIS — M79674 Pain in right toe(s): Secondary | ICD-10-CM | POA: Diagnosis not present

## 2021-05-11 DIAGNOSIS — B351 Tinea unguium: Secondary | ICD-10-CM

## 2021-05-11 NOTE — Progress Notes (Signed)
This patient returns to my office for at risk foot care.  This patient requires this care by a professional since this patient will be at risk due to having thrombocytopenia and diabetes.  This patient is unable to cut nails herself since the patient cannot reach her nails.These nails are painful walking and wearing shoes.  This patient presents for at risk foot care today.  General Appearance  Alert, conversant and in no acute stress.  Vascular  Dorsalis pedis and posterior tibial  pulses are palpable  bilaterally.  Capillary return is within normal limits  bilaterally. Temperature is within normal limits  bilaterally.  Neurologic  Senn-Weinstein monofilament wire test within normal limits  bilaterally. Muscle power within normal limits bilaterally.  Nails Thick disfigured discolored nails with subungual debris  from hallux to fifth toes bilaterally. No evidence of bacterial infection or drainage bilaterally.  Orthopedic  No limitations of motion  feet .  No crepitus or effusions noted.  No bony pathology or digital deformities noted.  HAV  B/L  Skin  normotropic skin with no porokeratosis noted bilaterally.  No signs of infections or ulcers noted.     Onychomycosis  Pain in right toes  Pain in left toes  Consent was obtained for treatment procedures.   Mechanical debridement of nails 1-5  bilaterally performed with a nail nipper.  Filed with dremel without incident.    Return office visit  3 months                    Told patient to return for periodic foot care and evaluation due to potential at risk complications.   [name] DPM

## 2021-05-14 ENCOUNTER — Encounter: Payer: Self-pay | Admitting: Oncology

## 2021-05-14 ENCOUNTER — Other Ambulatory Visit: Payer: Self-pay | Admitting: Physician Assistant

## 2021-05-14 ENCOUNTER — Telehealth: Payer: Self-pay | Admitting: Family Medicine

## 2021-05-14 ENCOUNTER — Other Ambulatory Visit: Payer: Self-pay | Admitting: *Deleted

## 2021-05-14 DIAGNOSIS — R319 Hematuria, unspecified: Secondary | ICD-10-CM

## 2021-05-14 NOTE — Telephone Encounter (Signed)
Patient left voicemail message to request follow up call; still stated urine in the morning has lots of blood in it. Patient unsure if issue is with kidneys or bladder. Still taking meds prescribed 2 weeks ago; not helping. Please advise at [phone].

## 2021-05-14 NOTE — Telephone Encounter (Signed)
Call placed to patient.   Reports that she has visible blood in urine in her first sample of the day. States that there is no frank blood seen after, but she does have lower abd pain and foul odor to urine.   Advised to stop by office to leave urine sample for testing.

## 2021-05-15 ENCOUNTER — Other Ambulatory Visit: Payer: Self-pay | Admitting: Family Medicine

## 2021-05-15 MED ORDER — SULFAMETHOXAZOLE-TRIMETHOPRIM 800-160 MG PO TABS: 1.0000 | ORAL_TABLET | Freq: Two times a day (BID) | ORAL | 0 refills | Status: DC

## 2021-05-15 NOTE — Telephone Encounter (Signed)
Received call from patient.   Reports that she will not be able to come to office to leave a sample. States that Sx have worsened.   PC made aware and new orders for ABTx sent to pharmacy.   Call placed to patient and patient made aware.

## 2021-05-17 ENCOUNTER — Other Ambulatory Visit: Payer: Self-pay | Admitting: Family Medicine

## 2021-05-17 ENCOUNTER — Other Ambulatory Visit: Payer: Self-pay | Admitting: *Deleted

## 2021-05-17 NOTE — Telephone Encounter (Signed)
Received call from patient.   Requested refill on Hydrocodone/APAP.   Ok to refill??  Last office visit 01/18/2021.  Last refill 03/09/2021.

## 2021-05-18 MED ORDER — HYDROCODONE-ACETAMINOPHEN 5-325 MG PO TABS: 1.0000 | ORAL_TABLET | Freq: Four times a day (QID) | ORAL | 0 refills | Status: DC | PRN

## 2021-05-28 ENCOUNTER — Other Ambulatory Visit: Payer: Self-pay | Admitting: Family Medicine

## 2021-05-28 NOTE — Telephone Encounter (Signed)
Patient left voicemail to follow up on refill request for zolpidem (AMBIEN) 10 MG tablet [[id number]]   Patient received call from [facility] stating provider rejected refill; patient wants to know why.  Patient stated she takes medications similar to it and is willing to give the other ones up if [name] will honor refill request; unable to sleep at night.   Please advise at [phone]

## 2021-05-28 NOTE — Telephone Encounter (Signed)
Request denied as patient is not currently prescribed Ambien. Patient will need OV to discuss.  Call placed to patient and patient made aware. Appointment scheduled.   Patient also requested refill on Clonazepam. Ok to refill?? Last office visit 03/09/2021. Last refill 04/16/2021.

## 2021-06-01 ENCOUNTER — Ambulatory Visit (INDEPENDENT_AMBULATORY_CARE_PROVIDER_SITE_OTHER): Payer: Medicare HMO | Admitting: Family Medicine

## 2021-06-01 ENCOUNTER — Other Ambulatory Visit: Payer: Self-pay

## 2021-06-01 VITALS — BP 162/80 | HR 66 | Temp 97.7°F | Wt 208.8 lb

## 2021-06-01 DIAGNOSIS — R4181 Age-related cognitive decline: Secondary | ICD-10-CM | POA: Diagnosis not present

## 2021-06-01 DIAGNOSIS — E1169 Type 2 diabetes mellitus with other specified complication: Secondary | ICD-10-CM | POA: Diagnosis not present

## 2021-06-01 NOTE — Addendum Note (Signed)
Addended by: Jenna Luo T on: 06/01/2021 01:11 PM   Modules accepted: Orders

## 2021-06-01 NOTE — Progress Notes (Signed)
Subjective:    Patient ID: Kelly [name], female    DOB: [birth year], 70 y.o.   MRN: [id number]  HPI  Recently, patient's son confided to me that he was called over to her house in the middle of the evening.  Apparently the patient had left her home.  She was confused.  She thought she was in someone else's house.  She had walked outside and had fallen down and was unable to get up.  Her husband was unable to get her up due to the fact he is missing his leg from an amputation.  Therefore the patient's son had to help pick her up and carry her back inside.  She was very confused.  Again she thought she was in someone else's home.  She was frustrated and wanted to leave.  She was disoriented as to where she was.  Apparently she had used all of her Klonopin and her husband's Klonopin.  A 30-day supply had been taken within a matter of a week or so per the son's report.  I do not feel that the patient is abusing the medication but I believe that due to her dementia she Dimarco have inadvertently taken too many pills at 1 time.  She was also using Ambien to help her sleep.  Therefore I feel that these medications are inappropriate for her given what appears to be worsening age-related cognitive decline.  We had a long and frank discussion about this today and I explained to her my concerns  Past Medical History:  Diagnosis Date   Anxiety    Chronic upper GI bleeding    intestinal AVM's, portal gastropathy, esophageal varices on egd 04/2018   Coronary artery disease involving native coronary artery without angina pectoris    Dementia ([place]) 09/16/2016   Depression    DIVERTICULITIS OF COLON 06/14/2008   Qualifier: Diagnosis of  By: Gretta Cool RN, Malachy Mood     DM (diabetes mellitus), type 2 ([place]) 09/15/2016   Esophageal varices determined by endoscopy ([place])    Hyperlipidemia    Hypertension    Hypothyroidism    Incidental pulmonary nodule, greater than or equal to 69m 10/14/2018   LLL nodule needs f/u CT or PET-CT  scan   Irritable bowel syndrome 04/06/2008   Centricity Description: IBS Qualifier: Diagnosis of  By: [name] (ADeborra Medina, [name]   Centricity Description: IRRITABLE BOWEL SYNDROME Qualifier: Diagnosis of  By: [name], [name]   Liver cirrhosis secondary to NASH ([place] 09/16/2016   NASH (nonalcoholic steatohepatitis)    Obesity    Portal hypertensive gastropathy ([facility]    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 04/20/19: s/p TAVR wtih a 225mMedtronic CoreValve Evolut Pro via the TF approach   Severe aortic stenosis    Splenomegaly 09/16/2016   Thrombocytopenia ([facility]   Thyroid disease    Tobacco abuse    Vitamin D deficiency    Past Surgical History:  Procedure Laterality Date   CARDIAC CATHETERIZATION     COLONOSCOPY WITH PROPOFOL N/A 04/09/2018   Procedure: COLONOSCOPY WITH PROPOFOL;  Surgeon: JaMilus BanisterMD;  Location: [facility];  Service: Endoscopy;  Laterality: N/A;   CORONARY ANGIOPLASTY     CORONARY STENT INTERVENTION N/A 02/11/2019   Procedure: CORONARY STENT INTERVENTION;  Surgeon: McBurnell BlanksMD;  [facility];  Service: Cardiovascular;  Laterality: N/A;  Prox RCA Prox LAD   ESOPHAGOGASTRODUODENOSCOPY (EGD) WITH PROPOFOL N/A 04/09/2018   Procedure:  ESOPHAGOGASTRODUODENOSCOPY (EGD) WITH PROPOFOL;  Surgeon: [name], MD;  Location: [facility];  Service: Endoscopy;  Laterality: N/A;   oopherectomy     OVARIAN CYST REMOVAL     POLYPECTOMY  04/09/2018   Procedure: POLYPECTOMY;  Surgeon: [name], MD;  Location: [facility];  Service: Endoscopy;;   RIGHT/LEFT HEART CATH AND CORONARY ANGIOGRAPHY N/A 10/07/2018   Procedure: RIGHT/LEFT HEART CATH AND CORONARY ANGIOGRAPHY;  Surgeon: [name], MD;  Location: [facility];  Service: Cardiovascular;  Laterality: N/A;   TEE WITHOUT CARDIOVERSION N/A 04/20/2019   Procedure: TRANSESOPHAGEAL ECHOCARDIOGRAM (TEE);  Surgeon: [name], MD;   Location: [facility];  Service: Open Heart Surgery;  Laterality: N/A;   TRANSCATHETER AORTIC VALVE REPLACEMENT, TRANSFEMORAL N/A 04/20/2019   Procedure: TRANSCATHETER AORTIC VALVE REPLACEMENT, TRANSFEMORAL approach using 27m Evolut PRO+ medtronic valve;  Surgeon: MBurnell Blanks MD;  [facility]  Service: Open Heart Surgery;  Laterality: N/A;   Current Outpatient Medications on File Prior to Visit  Medication Sig Dispense Refill   ACCU-CHEK AVIVA PLUS test strip      ACCU-CHEK SOFTCLIX LANCETS lancets      amLODipine (NORVASC) 10 MG tablet TAKE 1 TABLET BY MOUTH EVERY DAY 90 tablet 3   aspirin EC 81 MG tablet Take 1 tablet (81 mg total) by mouth daily. 90 tablet 3   atorvastatin (LIPITOR) 20 MG tablet TAKE 1 TABLET EVERY DAY 90 tablet 3   Blood Glucose Monitoring Suppl (ACCU-CHEK AVIVA PLUS) w/Device KIT      clonazePAM (KLONOPIN) 0.5 MG tablet Take 1 tablet (0.5 mg total) by mouth 2 (two) times daily as needed for anxiety (use sparingly). 30 tablet 0   FEROSUL 325 (65 Fe) MG tablet TAKE 1 TABLET EVERY DAY 90 tablet 3   furosemide (LASIX) 20 MG tablet Take 1 tablet (20 mg total) by mouth daily. 90 tablet 1   glipiZIDE (GLUCOTROL XL) 10 MG 24 hr tablet Take 1 tablet (10 mg total) by mouth daily with breakfast. 90 tablet 0   HYDROcodone-acetaminophen (NORCO) 5-325 MG tablet Take 1 tablet by mouth every 6 (six) hours as needed for moderate pain. 30 tablet 0   levothyroxine (SYNTHROID) 75 MCG tablet TAKE 1 TABLET EVERY DAY 90 tablet 3   losartan (COZAAR) 100 MG tablet TAKE 1 TABLET EVERY DAY 90 tablet 1   metFORMIN (GLUCOPHAGE-XR) 500 MG 24 hr tablet TAKE 2 TABLETS TWICE DAILY 360 tablet 1   nystatin (MYCOSTATIN) 100000 UNIT/ML suspension Take 5 mLs (500,000 Units total) by mouth 4 (four) times daily. 60 mL 0   pantoprazole (PROTONIX) 40 MG tablet TAKE 1 TABLET EVERY DAY 90 tablet 3   pioglitazone (ACTOS) 30 MG tablet TAKE 1 TABLET EVERY DAY 90 tablet 3   potassium chloride (KLOR-CON) 10  MEQ tablet      potassium chloride (KLOR-CON) 10 MEQ tablet TAKE 2 TABLETS (20 MEQ TOTAL) BY MOUTH DAILY. 180 tablet 3   sulfamethoxazole-trimethoprim (BACTRIM DS) 800-160 MG tablet Take 1 tablet by mouth 2 (two) times daily. 14 tablet 0   Current Facility-Administered Medications on File Prior to Visit  Medication Dose Route Frequency Provider Last Rate Last Admin   cyanocobalamin ((VITAMIN B-12)) injection 1,000 mcg  1,000 mcg Subcutaneous Q30 days PSusy Frizzle MD   1,000 mcg at 09/08/17 1408    Allergies  Allergen Reactions   Prednisone Itching    Heart rate increased   Celecoxib Rash   Macrodantin [Nitrofurantoin Macrocrystal] Rash  Social History   Socioeconomic History   Marital status: Married    Spouse name: Not on file   Number of children: 1   Years of education: Not on file   Highest education level: Not on file  Occupational History   Not on file  Tobacco Use   Smoking status: Every Day    Packs/day: 0.50    Types: Cigarettes   Smokeless tobacco: Never  Vaping Use   Vaping Use: Never used  Substance and Sexual Activity   Alcohol use: No   Drug use: Not Currently    Types: Benzodiazepines   Sexual activity: Not Currently  Other Topics Concern   Not on file  Social History Narrative   Not on file   Social Determinants of Health   Financial Resource Strain: Not on file  Food Insecurity: Not on file  Transportation Needs: Not on file  Physical Activity: Not on file  Stress: Not on file  Social Connections: Not on file  Intimate Partner Violence: Not on file       Review of Systems     Objective:   Physical Exam Constitutional:      Appearance: Normal appearance. She is obese.  Cardiovascular:     Rate and Rhythm: Normal rate and regular rhythm.     Heart sounds: Murmur heard.  Pulmonary:     Effort: Pulmonary effort is normal. No respiratory distress.     Breath sounds: Normal breath sounds. No wheezing, rhonchi or rales.   Abdominal:     General: Bowel sounds are normal.     Palpations: Abdomen is soft. There is no mass.     Tenderness: There is no guarding or rebound.  Neurological:     Mental Status: She is alert.          Assessment & Plan:  Type 2 diabetes mellitus with other specified complication, without long-term current use of insulin (HCC) - Plan: CBC with Differential/Platelet, COMPLETE METABOLIC PANEL WITH GFR, Lipid panel, TSH, Hemoglobin A1c  Age-related cognitive decline I have recommended that we stop Ambien and Klonopin and avoid any centrally acting medication due to recent events.  I was honest with the patient about my concerns and explained to her in detail why I was worried about using Klonopin and Ambien.  Therefore I will no longer refill these medications.  I will check a CBC CMP lipid panel TSH and A1c while the patient is here to monitor the maintenance of her chronic medical conditions including diabetes and hypothyroidism.  If her lab work is stable, if she insist on managing her anxiety, I will try sertraline 50 mg p.o. nightly as I feel that this is much safer for her and less apt to cause falls and confusion.

## 2021-06-02 LAB — CBC WITH DIFFERENTIAL/PLATELET
Absolute Monocytes: 399 cells/uL (ref 200–950)
Basophils Absolute: 19 cells/uL (ref 0–200)
Basophils Relative: 0.5 %
Eosinophils Absolute: 141 cells/uL (ref 15–500)
Eosinophils Relative: 3.7 %
HCT: 34.9 % — ABNORMAL LOW (ref 35.0–45.0)
Hemoglobin: 11.8 g/dL (ref 11.7–15.5)
Lymphs Abs: 1113 cells/uL (ref 850–3900)
MCH: 29.1 pg (ref 27.0–33.0)
MCHC: 33.8 g/dL (ref 32.0–36.0)
MCV: 86 fL (ref 80.0–100.0)
MPV: 12.4 fL (ref 7.5–12.5)
Monocytes Relative: 10.5 %
Neutro Abs: 2128 cells/uL (ref 1500–7800)
Neutrophils Relative %: 56 %
Platelets: 58 10*3/uL — ABNORMAL LOW (ref 140–400)
RBC: 4.06 10*6/uL (ref 3.80–5.10)
RDW: 14.9 % (ref 11.0–15.0)
Total Lymphocyte: 29.3 %
WBC: 3.8 10*3/uL (ref 3.8–10.8)

## 2021-06-02 LAB — COMPLETE METABOLIC PANEL WITH GFR
AG Ratio: 1.5 (calc) (ref 1.0–2.5)
ALT: 13 U/L (ref 6–29)
AST: 23 U/L (ref 10–35)
Albumin: 4 g/dL (ref 3.6–5.1)
Alkaline phosphatase (APISO): 85 U/L (ref 37–153)
BUN: 11 mg/dL (ref 7–25)
CO2: 22 mmol/L (ref 20–32)
Calcium: 9.6 mg/dL (ref 8.6–10.4)
Chloride: 109 mmol/L (ref 98–110)
Creat: 0.88 mg/dL (ref 0.60–1.00)
Globulin: 2.6 g/dL (calc) (ref 1.9–3.7)
Glucose, Bld: 104 mg/dL — ABNORMAL HIGH (ref 65–99)
Potassium: 3.9 mmol/L (ref 3.5–5.3)
Sodium: 141 mmol/L (ref 135–146)
Total Bilirubin: 1.2 mg/dL (ref 0.2–1.2)
Total Protein: 6.6 g/dL (ref 6.1–8.1)
eGFR: 70 mL/min/{1.73_m2} (ref >=60)

## 2021-06-02 LAB — LIPID PANEL
Cholesterol: 160 mg/dL (ref <200)
HDL: 57 mg/dL (ref >=50)
LDL Cholesterol (Calc): 83 mg/dL (calc)
Non-HDL Cholesterol (Calc): 103 mg/dL (calc) (ref <130)
Total CHOL/HDL Ratio: 2.8 (calc) (ref <5.0)
Triglycerides: 106 mg/dL (ref <150)

## 2021-06-02 LAB — HEMOGLOBIN A1C
Hgb A1c MFr Bld: 4.8 % of total Hgb (ref <5.7)
Mean Plasma Glucose: 91 mg/dL
eAG (mmol/L): 5 mmol/L

## 2021-06-02 LAB — TSH: TSH: 5.59 mIU/L — ABNORMAL HIGH (ref 0.40–4.50)

## 2021-06-04 ENCOUNTER — Other Ambulatory Visit: Payer: Self-pay | Admitting: *Deleted

## 2021-06-04 MED ORDER — LEVOTHYROXINE SODIUM 88 MCG PO TABS: 88.0000 ug | ORAL_TABLET | Freq: Every day | ORAL | 3 refills | Status: DC

## 2021-06-12 ENCOUNTER — Other Ambulatory Visit: Payer: Self-pay | Admitting: Family Medicine

## 2021-06-21 DIAGNOSIS — H6123 Impacted cerumen, bilateral: Secondary | ICD-10-CM | POA: Diagnosis not present

## 2021-06-22 ENCOUNTER — Telehealth: Payer: Self-pay

## 2021-06-22 NOTE — Telephone Encounter (Signed)
Pt called to report she has had 2 nosebleeds, one from each nare, in the past 2 days. Pt states bleeding did stop on its own with each. Pt also reports possible sinus headache and congestion. Advised pt her nasal passages Sterba be irritated or inflamed from the possible sinus congestion. Pt asked about medication for headache, advised her to avoid acetaminophen as she has liver disease. Recommend ibuprofen or naproxen for headache, cautioned about increased bleeding risk with NSAIDs. Also recommended nasal saline gel to be applied several times daily to nasal passages to help with inflammation/congestion. Pt urged to seek care at [facility] if she has nosebleed that does not stop on its own, or if she has significant nosebleed accompanied by sudden onset headache. Pt voiced understanding to all, nothing further needed at this time.

## 2021-07-12 ENCOUNTER — Ambulatory Visit (INDEPENDENT_AMBULATORY_CARE_PROVIDER_SITE_OTHER): Payer: Medicare HMO

## 2021-07-12 ENCOUNTER — Telehealth: Payer: Self-pay

## 2021-07-12 ENCOUNTER — Other Ambulatory Visit: Payer: Self-pay

## 2021-07-12 VITALS — Ht 65.0 in | Wt 206.0 lb

## 2021-07-12 DIAGNOSIS — Z0000 Encounter for general adult medical examination without abnormal findings: Secondary | ICD-10-CM | POA: Diagnosis not present

## 2021-07-12 NOTE — Telephone Encounter (Signed)
Called patient for [facility]V. Pt states her husband is going in for additional surgery to his leg and will need to be in Rehab after surgery. Pt is very concerned/anxious about this upcoming surgery and being alone while her husband is in Rehab. Pt asks if there is any way she can get at refill on there Zolpidem, to help her sleep after her husbands surgery? I discussed with the patient regarding her anxiety and if she would be willing to come in an discuss with you but patient states that you already know about her anxiety issues. Thank you.

## 2021-07-12 NOTE — Progress Notes (Signed)
Subjective:   Kelly [name] is a 70 y.o. female who presents for an Initial Medicare Annual Wellness Visit. Virtual Visit via Telephone Note  I connected with  Kelly [name] on 07/12/21 at 11:15 AM EST by telephone and verified that I am speaking with the correct person using two identifiers.  Location: Patient: Home Provider: [facility] Persons participating in the virtual visit: patient/Nurse Health Advisor   I discussed the limitations, risks, security and privacy concerns of performing an evaluation and management service by telephone and the availability of in person appointments. The patient expressed understanding and agreed to proceed.  Interactive audio and video telecommunications were attempted between this nurse and patient, however failed, due to patient having technical difficulties OR patient did not have access to video capability.  We continued and completed visit with audio only.  Some vital signs [place] be absent or patient reported.   Chriss Driver, LPN  Review of Systems     Cardiac Risk Factors include: diabetes mellitus;hypertension;dyslipidemia;advanced age (>26mn, >>42women);obesity (BMI >30kg/m2);sedentary lifestyle     Objective:    Today's Vitals   07/12/21 1059  Weight: 206 lb (93.4 kg)  Height: 5' 5"  (1.651 m)   Body mass index is 34.28 kg/m.  Advanced Directives 07/12/2021 06/01/2020 04/20/2019 04/16/2019 02/26/2019 02/25/2019 02/11/2019  Does Patient Have a Medical Advance Directive? Yes No Yes Yes No Yes No  Type of AParamedicof ASanta RosaLiving will - HEast Flat RockLiving will HCountry WalkLiving will - HCedar CrestLiving will -  Does patient want to make changes to medical advance directive? - - No - Patient declined - - - -  Copy of [place]in Chart? No - copy requested - No - copy requested No - copy requested - No - copy requested -  Would patient like  information on creating a medical advance directive? - No - Patient declined - - No - Patient declined - No - Patient declined  Some encounter information is confidential and restricted. Go to Review Flowsheets activity to see all data.    Current Medications (verified) Outpatient Encounter Medications as of 07/12/2021  Medication Sig   ACCU-CHEK AVIVA PLUS test strip    ACCU-CHEK SOFTCLIX LANCETS lancets    amLODipine (NORVASC) 10 MG tablet TAKE 1 TABLET BY MOUTH EVERY DAY   atorvastatin (LIPITOR) 20 MG tablet TAKE 1 TABLET EVERY DAY   Blood Glucose Monitoring Suppl (ACCU-CHEK AVIVA PLUS) w/Device KIT    FEROSUL 325 (65 Fe) MG tablet TAKE 1 TABLET EVERY DAY   furosemide (LASIX) 20 MG tablet Take 1 tablet (20 mg total) by mouth daily.   levothyroxine (SYNTHROID) 88 MCG tablet Take 1 tablet (88 mcg total) by mouth daily.   losartan (COZAAR) 100 MG tablet TAKE 1 TABLET EVERY DAY   metFORMIN (GLUCOPHAGE-XR) 500 MG 24 hr tablet TAKE 2 TABLETS TWICE DAILY   pantoprazole (PROTONIX) 40 MG tablet TAKE 1 TABLET EVERY DAY   potassium chloride (KLOR-CON) 10 MEQ tablet TAKE 2 TABLETS (20 MEQ TOTAL) BY MOUTH DAILY.   aspirin EC 81 MG tablet Take 1 tablet (81 mg total) by mouth daily. (Patient not taking: Reported on 07/12/2021)   glipiZIDE (GLUCOTROL XL) 10 MG 24 hr tablet Take 1 tablet (10 mg total) by mouth daily with breakfast. (Patient not taking: Reported on 07/12/2021)   pioglitazone (ACTOS) 30 MG tablet TAKE 1 TABLET EVERY DAY (Patient not taking: Reported on 07/12/2021)  Facility-Administered Encounter Medications as of 07/12/2021  Medication   cyanocobalamin ((VITAMIN B-12)) injection 1,000 mcg    Allergies (verified) Prednisone, Celecoxib, and Macrodantin [nitrofurantoin macrocrystal]   History: Past Medical History:  Diagnosis Date   Anxiety    Chronic upper GI bleeding    intestinal AVM's, portal gastropathy, esophageal varices on egd 04/2018   Coronary artery disease involving native  coronary artery without angina pectoris    Dementia ([place]) 09/16/2016   Depression    DIVERTICULITIS OF COLON 06/14/2008   Qualifier: Diagnosis of  By: Gretta Cool RN, Malachy Mood     DM (diabetes mellitus), type 2 ([place]) 09/15/2016   Esophageal varices determined by endoscopy ([place])    Hyperlipidemia    Hypertension    Hypothyroidism    Incidental pulmonary nodule, greater than or equal to 77m 10/14/2018   LLL nodule needs f/u CT or PET-CT scan   Irritable bowel syndrome 04/06/2008   Centricity Description: IBS Qualifier: Diagnosis of  By: [name] (ADeborra Medina, [name]   Centricity Description: IRRITABLE BOWEL SYNDROME Qualifier: Diagnosis of  By: [name], [name]   Liver cirrhosis secondary to NASH ([place] 09/16/2016   NASH (nonalcoholic steatohepatitis)    Obesity    Portal hypertensive gastropathy ([place]    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 04/20/19: s/p TAVR wtih a 274mMedtronic CoreValve Evolut Pro via the TF approach   Severe aortic stenosis    Splenomegaly 09/16/2016   Thrombocytopenia ([place]   Thyroid disease    Tobacco abuse    Vitamin D deficiency    Past Surgical History:  Procedure Laterality Date   CARDIAC CATHETERIZATION     COLONOSCOPY WITH PROPOFOL N/A 04/09/2018   Procedure: COLONOSCOPY WITH PROPOFOL;  Surgeon: JaMilus BanisterMD;  [facility];  Service: Endoscopy;  Laterality: N/A;   CORONARY ANGIOPLASTY     CORONARY STENT INTERVENTION N/A 02/11/2019   Procedure: CORONARY STENT INTERVENTION;  Surgeon: McBurnell BlanksMD;  [facility];  Service: Cardiovascular;  Laterality: N/A;  Prox RCA Prox LAD   ESOPHAGOGASTRODUODENOSCOPY (EGD) WITH PROPOFOL N/A 04/09/2018   Procedure: ESOPHAGOGASTRODUODENOSCOPY (EGD) WITH PROPOFOL;  Surgeon: JaMilus BanisterMD;  [facility];  Service: Endoscopy;  Laterality: N/A;   oopherectomy     OVARIAN CYST REMOVAL     POLYPECTOMY  04/09/2018   Procedure: POLYPECTOMY;  Surgeon:  JaMilus BanisterMD;  [facility]: [facility];  Service: Endoscopy;;   RIGHT/LEFT HEART CATH AND CORONARY ANGIOGRAPHY N/A 10/07/2018   Procedure: RIGHT/LEFT HEART CATH AND CORONARY ANGIOGRAPHY;  Surgeon: McBurnell BlanksMD;  [facility];  Service: Cardiovascular;  Laterality: N/A;   TEE WITHOUT CARDIOVERSION N/A 04/20/2019   Procedure: TRANSESOPHAGEAL ECHOCARDIOGRAM (TEE);  Surgeon: McBurnell BlanksMD;  [facility]: Open [facility] Surgery;  Laterality: N/A;   TRANSCATHETER AORTIC VALVE REPLACEMENT, TRANSFEMORAL N/A 04/20/2019   Procedure: TRANSCATHETER AORTIC VALVE REPLACEMENT, TRANSFEMORAL approach using 2325mvolut PRO+ medtronic valve;  Surgeon: McABurnell BlanksD;  [facility];  Laterality: N/A;   Family History  Problem Relation Age of Onset   Lung cancer Father    Heart disease Father        MI at age 55 40Alcohol abuse Father    Alzheimer's disease Mother    Dementia Mother    Other Mother        twisted colon    Stomach cancer  Neg Hx    Colon cancer Neg Hx    Social History   Socioeconomic History   Marital status: Married    Spouse name: Not on file   Number of children: 1   Years of education: Not on file   Highest education level: Not on file  Occupational History   Not on file  Tobacco Use   Smoking status: Every Day    Packs/day: 0.50    Types: Cigarettes   Smokeless tobacco: Never  Vaping Use   Vaping Use: Never used  Substance and Sexual Activity   Alcohol use: No   Drug use: Not Currently    Types: Benzodiazepines   Sexual activity: Not Currently  Other Topics Concern   Not on file  Social History Narrative   Married x 15 years in 2023.   1 son   2 grandsons   Social Determinants of Radio broadcast assistant Strain: Low Risk    Difficulty of Paying Living Expenses: Not hard at all  Food Insecurity: No Food Insecurity   Worried About Charity fundraiser in the  Last Year: Never true   Arboriculturist in the Last Year: Never true  Transportation Needs: No Transportation Needs   Lack of Transportation (Medical): No   Lack of Transportation (Non-Medical): No  Physical Activity: Inactive   Days of Exercise per Week: 0 days   Minutes of Exercise per Session: 0 min  Stress: Stress Concern Present   Feeling of Stress : Rather much  Social Connections: Moderately Integrated   Frequency of Communication with Friends and Family: Three times a week   Frequency of Social Gatherings with Friends and Family: Once a week   Attends Religious Services: 1 to 4 times per year   Active Member of [employer] or Organizations: No   Attends Music therapist: Never   Marital Status: Married    Tobacco Counseling Ready to quit: Not Answered Counseling given: Not Answered   Clinical Intake:  Pre-visit preparation completed: Yes  Pain : No/denies pain     BMI - recorded: 34.28 Nutritional Status: BMI > 30  Obese Nutritional Risks: None Diabetes: Yes  How often do you need to have someone help you when you read instructions, pamphlets, or other written materials from your doctor or pharmacy?: 1 - Never  Diabetic?Nutrition Risk Assessment:  Has the patient had any N/V/D within the last 2 months?  No  Does the patient have any non-healing wounds?  No  Has the patient had any unintentional weight loss or weight gain?  No   Diabetes:  Is the patient diabetic?  Yes  If diabetic, was a CBG obtained today?  No  Did the patient bring in their glucometer from home?  No Phone visit. How often do you monitor your CBG's? 2-3 times per month.   Financial Strains and Diabetes Management:  Are you having any financial strains with the device, your supplies or your medication? No .  Does the patient want to be seen by Chronic Care Management for management of their diabetes?  No  Would the patient like to be referred to a Nutritionist or for Diabetic  Management?  No   Diabetic Exams:  Diabetic Eye Exam: Completed 2020. Overdue for diabetic eye exam. Pt has been advised about the importance in completing this exam.  Diabetic Foot Exam: Completed 05/11/2021. Pt has been advised about the importance in completing this exam.   Interpreter Needed?: No  Information entered by :: MJ Jenilyn Magana, LPN   Activities of Daily Living In your present state of health, do you have any difficulty performing the following activities: 07/12/2021  Hearing? N  Vision? N  Difficulty concentrating or making decisions? N  Walking or climbing stairs? Y  Dressing or bathing? N  Doing errands, shopping? N  Preparing Food and eating ? N  Using the Toilet? N  Managing your Medications? N  Managing your Finances? N  Housekeeping or managing your Housekeeping? N  Some recent data might be hidden    Patient Care Team: [name], MD as PCP - General (Family Medicine) [name], MD as PCP - Cardiology (Cardiology) [name], [facility] as Pharmacist (Pharmacist)  Indicate any recent Medical Services you Get[place] have received from other than [facility] providers in the past year (date Scaff be approximate).     Assessment:   This is a routine wellness examination for [place].  Hearing/Vision screen Hearing Screening - Comments:: No hearing issues. Vision Screening - Comments:: No glasses. Overdue for eye exam. Dr. Einar Gip.  Dietary issues and exercise activities discussed: Current Exercise Habits: The patient does not participate in regular exercise at present, Exercise limited by: cardiac condition(s)   Goals Addressed             This Visit's Progress    Have 3 meals a day       Maintain healthy diet.       Depression Screen PHQ 2/9 Scores 07/12/2021 09/13/2020 07/27/2020 01/24/2020 02/23/2018 09/08/2017  PHQ - 2 Score 2 0 6 0 5 2  PHQ- 9 Score 4 - 21 - 21 10    Fall Risk Fall Risk  07/12/2021 09/13/2020 01/24/2020 02/23/2018 09/08/2017   Falls in the past year? 1 0 0 No No  Number falls in past yr: 0 0 0 - -  Injury with Fall? 0 0 0 - -  Risk for fall due to : Impaired balance/gait;Impaired mobility - - - -  Follow up Falls prevention discussed - Falls evaluation completed - -    FALL RISK PREVENTION PERTAINING TO THE HOME:  Any stairs in or around the home? Yes  If so, are there any without handrails? No  Home free of loose throw rugs in walkways, pet beds, electrical cords, etc? Yes  Adequate lighting in your home to reduce risk of falls? Yes   ASSISTIVE DEVICES UTILIZED TO PREVENT FALLS:  Life alert? Yes  Use of a cane, walker or w/c? No  Grab bars in the bathroom? Yes  Shower chair or bench in shower? Yes  Elevated toilet seat or a handicapped toilet? Yes   TIMED UP AND GO:  Was the test performed? No .  Phone visit.   Cognitive Function:     6CIT Screen 07/12/2021  What Year? 0 points  What month? 0 points  What time? 0 points  Count back from 20 0 points  Months in reverse 0 points  Repeat phrase 0 points  Total Score 0    Immunizations Immunization History  Administered Date(s) Administered   Fluad Quad(high Dose 65+) 04/21/2019, 04/28/2020   Influenza, High Dose Seasonal PF 06/02/2017, 04/15/2018   Influenza-Unspecified 06/05/2016   PFIZER(Purple Top)SARS-COV-2 Vaccination 07/10/2020   Pneumococcal Polysaccharide-23 04/21/2019    TDAP status: Up to date  Flu Vaccine status: Up to date  Pneumococcal vaccine status: Up to date  Covid-19 vaccine status: Information provided on how to obtain vaccines.   Qualifies for Shingles  Vaccine? Yes   Zostavax completed No   Shingrix Completed?: No.    Education has been provided regarding the importance of this vaccine. Patient has been advised to call insurance company to determine out of pocket expense if they have not yet received this vaccine. Advised Sou[place] also receive vaccine at local pharmacy or Health Dept. Verbalized acceptance and  understanding.  Screening Tests Health Maintenance  Topic Date Due   OPHTHALMOLOGY EXAM  Never done   TETANUS/TDAP  Never done   Zoster Vaccines- Shingrix (1 of 2) Never done   DEXA SCAN  Never done   MAMMOGRAM  06/14/2019   Pneumonia Vaccine 69+ Years old (2 - PCV) 04/20/2020   COVID-19 Vaccine (2 - Pfizer series) 07/31/2020   INFLUENZA VACCINE  03/05/2021   HEMOGLOBIN A1C  11/30/2021   FOOT EXAM  05/11/2022   COLONOSCOPY (Pts 45-63yr Insurance coverage will need to be confirmed)  04/09/2028   Hepatitis C Screening  Completed   HPV VACCINES  Aged Out    Health Maintenance  Health Maintenance Due  Topic Date Due   OPHTHALMOLOGY EXAM  Never done   TETANUS/TDAP  Never done   Zoster Vaccines- Shingrix (1 of 2) Never done   DEXA SCAN  Never done   MAMMOGRAM  06/14/2019   Pneumonia Vaccine 72 Years old (2 - PCV) 04/20/2020   COVID-19 Vaccine (2 - Pfizer series) 07/31/2020   INFLUENZA VACCINE  03/05/2021    Colorectal cancer screening: Type of screening: Colonoscopy. Completed 04/09/2018. Repeat every 10 years  Mammogram status: Completed 06/13/2017. Repeat every year  Bone Density status: Ordered DISCUSSED WITH PT. Pt provided with contact info and advised to call to schedule appt.  Lung Cancer Screening: (Low Dose CT Chest recommended if Age 339-80years, 30 pack-year currently smoking OR have quit w/in 15years.) does qualify.   Lung Cancer Screening Referral: PT DECLINED  Additional Screening:  Hepatitis C Screening: does qualify; Completed 02/28/2009  Vision Screening: Recommended annual ophthalmology exams for early detection of glaucoma and other disorders of the eye. Is the patient up to date with their annual eye exam?  No  Who is the provider or what is the name of the office in which the patient attends annual eye exams? Dr. MEinar GipIf pt is not established with a provider, would they like to be referred to a provider to establish care? No .   Dental Screening:  Recommended annual dental exams for proper oral hygiene  Community Resource Referral / Chronic Care Management: CRR required this visit?  No   CCM required this visit?  No      Plan:     I have personally reviewed and noted the following in the patient's chart:   Medical and social history Use of alcohol, tobacco or illicit drugs  Current medications and supplements including opioid prescriptions. Patient is not currently taking opioid prescriptions. Functional ability and status Nutritional status Physical activity Advanced directives List of other physicians Hospitalizations, surgeries, and ER visits in previous 12 months Vitals Screenings to include cognitive, depression, and falls Referrals and appointments  In addition, I have reviewed and discussed with patient certain preventive protocols, quality metrics, and best practice recommendations. A written personalized care plan for preventive services as well as general preventive health recommendations were provided to patient.     MChriss Driver LPN   [id number]  Nurse Notes: PHONE VISIT. PT AT HOME. NURSE AT [facility].  Pt declined scheduling Mammogram or Dexa due to  husband's upcoming surgery and Rehab stay. Encouraged pt to call office after first of year to schedule. Pt verbalized understanding of all. Over due for Diabetic eye and foot exams. Encouraged pt to have exams. Discussed Shingrix and Covid vaccines and how to obtain.

## 2021-07-12 NOTE — Patient Instructions (Signed)
Kelly [name] , Thank you for taking time to come for your Medicare Wellness Visit. I appreciate your ongoing commitment to your health goals. Please review the following plan we discussed and let me know if I can assist you in the future.   Screening recommendations/referrals: Colonoscopy: Done 04/09/2018 Repeat in 10 years  Mammogram: done 06/13/2017. Repeat annually  Bone Density: Call to schedule after first of year.  Recommended yearly ophthalmology/optometry visit for glaucoma screening and checkup Recommended yearly dental visit for hygiene and checkup  Vaccinations: Influenza vaccine: Done 06/05/2021 Repeat annually  Pneumococcal vaccine: Done 10/17/2014 and 04/21/2019 Tdap vaccine: Done 05/08/2016 Repeat in 10 years  Shingles vaccine: Shingrix discussed. Please contact your pharmacy for coverage information.     Covid-19:Done 07/10/2020  Advanced directives: Please bring a copy of your health care power of attorney and living will to the office to be added to your chart at your convenience.   Conditions/risks identified: Aim for 30 minutes of exercise or walking each day, drink 6-8 glasses of water and eat lots of fruits and vegetables.   Next appointment: Follow up in one year for your annual wellness visit 2023.   Preventive Care 72 Years and Older, Female Preventive care refers to lifestyle choices and visits with your health care provider that can promote health and wellness. What does preventive care include? A yearly physical exam. This is also called an annual well check. Dental exams once or twice a year. Routine eye exams. Ask your health care provider how often you should have your eyes checked. Personal lifestyle choices, including: Daily care of your teeth and gums. Regular physical activity. Eating a healthy diet. Avoiding tobacco and drug use. Limiting alcohol use. Practicing safe sex. Taking low-dose aspirin every day. Taking vitamin and mineral supplements as  recommended by your health care provider. What happens during an annual well check? The services and screenings done by your health care provider during your annual well check will depend on your age, overall health, lifestyle risk factors, and family history of disease. Counseling  Your health care provider Verma ask you questions about your: Alcohol use. Tobacco use. Drug use. Emotional well-being. Home and relationship well-being. Sexual activity. Eating habits. History of falls. Memory and ability to understand (cognition). Work and work Statistician. Reproductive health. Screening  You Kolbeck have the following tests or measurements: Height, weight, and BMI. Blood pressure. Lipid and cholesterol levels. These Hoff be checked every 5 years, or more frequently if you are over 49 years old. Skin check. Lung cancer screening. You Holbein have this screening every year starting at age 72 if you have a 30-pack-year history of smoking and currently smoke or have quit within the past 15 years. Fecal occult blood test (FOBT) of the stool. You Lakins have this test every year starting at age 72. Flexible sigmoidoscopy or colonoscopy. You Hicks have a sigmoidoscopy every 5 years or a colonoscopy every 10 years starting at age 72. Hepatitis C blood test. Hepatitis B blood test. Sexually transmitted disease (STD) testing. Diabetes screening. This is done by checking your blood sugar (glucose) after you have not eaten for a while (fasting). You Marasco have this done every 1-3 years. Bone density scan. This is done to screen for osteoporosis. You Merkley have this done starting at age 72. Mammogram. This Sukup be done every 1-2 years. Talk to your health care provider about how often you should have regular mammograms. Talk with your health care provider about your test results, treatment  options, and if necessary, the need for more tests. Vaccines  Your health care provider Wilensky recommend certain vaccines, such  as: Influenza vaccine. This is recommended every year. Tetanus, diphtheria, and acellular pertussis (Tdap, Td) vaccine. You Dundon need a Td booster every 10 years. Zoster vaccine. You Rominger need this after age 72. Pneumococcal 13-valent conjugate (PCV13) vaccine. One dose is recommended after age 72. Pneumococcal polysaccharide (PPSV23) vaccine. One dose is recommended after age 72. Talk to your health care provider about which screenings and vaccines you need and how often you need them. This information is not intended to replace advice given to you by your health care provider. Make sure you discuss any questions you have with your health care provider. Document Released: 08/18/2015 Document Revised: 04/10/2016 Document Reviewed: 05/23/2015 Elsevier Interactive Patient Education  2017 [employer] Prevention in the Home Falls can cause injuries. They can happen to people of all ages. There are many things you can do to make your home safe and to help prevent falls. What can I do on the outside of my home? Regularly fix the edges of walkways and driveways and fix any cracks. Remove anything that might make you trip as you walk through a door, such as a raised step or threshold. Trim any bushes or trees on the path to your home. Use bright outdoor lighting. Clear any walking paths of anything that might make someone trip, such as rocks or tools. Regularly check to see if handrails are loose or broken. Make sure that both sides of any steps have handrails. Any raised decks and porches should have guardrails on the edges. Have any leaves, snow, or ice cleared regularly. Use sand or salt on walking paths during winter. Clean up any spills in your garage right away. This includes oil or grease spills. What can I do in the bathroom? Use night lights. Install grab bars by the toilet and in the tub and shower. Do not use towel bars as grab bars. Use non-skid mats or decals in the tub or  shower. If you need to sit down in the shower, use a plastic, non-slip stool. Keep the floor dry. Clean up any water that spills on the floor as soon as it happens. Remove soap buildup in the tub or shower regularly. Attach bath mats securely with double-sided non-slip rug tape. Do not have throw rugs and other things on the floor that can make you trip. What can I do in the bedroom? Use night lights. Make sure that you have a light by your bed that is easy to reach. Do not use any sheets or blankets that are too big for your bed. They should not hang down onto the floor. Have a firm chair that has side arms. You can use this for support while you get dressed. Do not have throw rugs and other things on the floor that can make you trip. What can I do in the kitchen? Clean up any spills right away. Avoid walking on wet floors. Keep items that you use a lot in easy-to-reach places. If you need to reach something above you, use a strong step stool that has a grab bar. Keep electrical cords out of the way. Do not use floor polish or wax that makes floors slippery. If you must use wax, use non-skid floor wax. Do not have throw rugs and other things on the floor that can make you trip. What can I do with my stairs? Do not  leave any items on the stairs. Make sure that there are handrails on both sides of the stairs and use them. Fix handrails that are broken or loose. Make sure that handrails are as long as the stairways. Check any carpeting to make sure that it is firmly attached to the stairs. Fix any carpet that is loose or worn. Avoid having throw rugs at the top or bottom of the stairs. If you do have throw rugs, attach them to the floor with carpet tape. Make sure that you have a light switch at the top of the stairs and the bottom of the stairs. If you do not have them, ask someone to add them for you. What else can I do to help prevent falls? Wear shoes that: Do not have high heels. Have  rubber bottoms. Are comfortable and fit you well. Are closed at the toe. Do not wear sandals. If you use a stepladder: Make sure that it is fully opened. Do not climb a closed stepladder. Make sure that both sides of the stepladder are locked into place. Ask someone to hold it for you, if possible. Clearly mark and make sure that you can see: Any grab bars or handrails. First and last steps. Where the edge of each step is. Use tools that help you move around (mobility aids) if they are needed. These include: Canes. Walkers. Scooters. Crutches. Turn on the lights when you go into a dark area. Replace any light bulbs as soon as they burn out. Set up your furniture so you have a clear path. Avoid moving your furniture around. If any of your floors are uneven, fix them. If there are any pets around you, be aware of where they are. Review your medicines with your doctor. Some medicines can make you feel dizzy. This can increase your chance of falling. Ask your doctor what other things that you can do to help prevent falls. This information is not intended to replace advice given to you by your health care provider. Make sure you discuss any questions you have with your health care provider. Document Released: 05/18/2009 Document Revised: 12/28/2015 Document Reviewed: 08/26/2014 Elsevier Interactive Patient Education  2017 [employer].

## 2021-08-09 ENCOUNTER — Telehealth: Payer: Self-pay | Admitting: *Deleted

## 2021-08-09 ENCOUNTER — Other Ambulatory Visit: Payer: Self-pay | Admitting: *Deleted

## 2021-08-09 MED ORDER — CEPHALEXIN 500 MG PO CAPS: 500.0000 mg | ORAL_CAPSULE | Freq: Three times a day (TID) | ORAL | 0 refills | Status: DC

## 2021-08-09 NOTE — Telephone Encounter (Signed)
Pt called -stated still having bladder problem--having lower back pain, worse when standing short term. Pt mention prescribe antibiotic by Dr. [name], and if possible to have refill. Pt stated not able to come to make an appt. Due to pt husband unable to drive. Please advise.

## 2021-08-09 NOTE — Telephone Encounter (Signed)
Pt stated -no burning sensation but have pressure when urinating. Notified pt Sent keplex 500 mg  1 capsule 3 times daily and if not getting any better call the office to make an appt. Pt understood w/o questions.

## 2021-08-10 NOTE — Progress Notes (Deleted)
Chronic Care Management Pharmacy Note  08/10/2021 Name:  Kelly [name] MRN:  [id number] DOB:  1951/01/10  Summary: ***  Recommendations/Changes made from today's visit: ***  Plan: ***   Subjective: Kelly [name] is an 71 y.o. year old female who is a primary patient of Pickard, [name], MD.  The CCM team was consulted for assistance with disease management and care coordination needs.    Engaged with patient by telephone for initial visit in response to provider referral for pharmacy case management and/or care coordination services.   Consent to Services:  The patient was given the following information about Chronic Care Management services today, agreed to services, and gave verbal consent: 1. CCM service includes personalized support from designated clinical staff supervised by the primary care provider, including individualized plan of care and coordination with other care providers 2. 24/7 contact phone numbers for assistance for urgent and routine care needs. 3. Service will only be billed when office clinical staff spend 20 minutes or more in a month to coordinate care. 4. Only one practitioner [place] furnish and bill the service in a calendar month. 5.The patient Tarry stop CCM services at any time (effective at the end of the month) by phone call to the office staff. 6. The patient will be responsible for cost sharing (co-pay) of up to 20% of the service fee (after annual deductible is met). Patient agreed to services and consent obtained.  Patient Care Team: [name], MD as PCP - General (Family Medicine) [name], MD as PCP - Cardiology (Cardiology) [name], [facility] as Pharmacist (Pharmacist)  Recent office visits:  06/01/2021 OV (PCP) [name], MD; "I have recommended that we stop Ambien and Klonopin and avoid any centrally acting medication due to recent events.  I was honest with the patient about my concerns and explained to her in  detail why I was worried about using Klonopin and Ambien.  Therefore I will no longer refill these medications. If her lab work is stable, if she insist on managing her anxiety, I will try sertraline 50 mg p.o. nightly as I feel that this is much safer for her and less apt to cause falls and confusion."   03/09/2021 VV (PCP) [name], MD;  "I recommended stopping the Ambien at night as the patient discussed.  We will start Klonopin 0.5 mg tablets.  She can take 1/2 to 1 tablet every 12 hours as needed for anxiety.  I will give her 30 tablets and I have encouraged her to use these sparingly.  I would be extremely happy if these 30 tablets lasts her 8 months again.  Have also cautioned her to be careful and not to combine the medication with hydrocodone which she takes perhaps once a day for pain in her back."   Recent consult visits:  05/11/2021 OV (Podiatry) [name], DPM; no medication changes indicated.   Hospital visits:  None in previous 6 months   Objective:  Lab Results  Component Value Date   CREATININE 0.88 06/01/2021   BUN 11 06/01/2021   GFR 71.88 02/03/2019   GFRNONAA 89 01/18/2021   GFRAA 103 01/18/2021   NA 141 06/01/2021   K 3.9 06/01/2021   CALCIUM 9.6 06/01/2021   CO2 22 06/01/2021   GLUCOSE 104 (H) 06/01/2021    Lab Results  Component Value Date/Time   HGBA1C 4.8 06/01/2021 01:07 PM   HGBA1C 5.6 10/13/2020 02:18 PM   GFR 71.88  02/03/2019 09:37 AM   GFR 80.02 07/13/2018 10:16 AM   MICROALBUR 1.5 01/24/2020 03:28 PM   MICROALBUR 1.7 09/08/2017 12:04 PM    Last diabetic Eye exam: No results found for: HMDIABEYEEXA  Last diabetic Foot exam: No results found for: HMDIABFOOTEX   Lab Results  Component Value Date   CHOL 160 06/01/2021   HDL 57 06/01/2021   LDLCALC 83 06/01/2021   TRIG 106 06/01/2021   CHOLHDL 2.8 06/01/2021    Hepatic Function Latest Ref Rng & Units 06/01/2021 01/18/2021 10/13/2020  Total Protein 6.1 - 8.1 g/dL 6.6 6.7 6.4   Albumin 3.5 - 5.0 g/dL - - -  AST 10 - 35 U/L 23 20 21   ALT 6 - 29 U/L 13 13 12   Alk Phosphatase 38 - 126 U/L - - -  Total Bilirubin 0.2 - 1.2 mg/dL 1.2 1.0 0.8    Lab Results  Component Value Date/Time   TSH 5.59 (H) 06/01/2021 01:07 PM   TSH 3.04 10/13/2020 02:18 PM    CBC Latest Ref Rng & Units 06/01/2021 01/18/2021 10/13/2020  WBC 3.8 - 10.8 Thousand/uL 3.8 6.7 5.1  Hemoglobin 11.7 - 15.5 g/dL 11.8 12.0 11.9  Hematocrit 35.0 - 45.0 % 34.9(L) 35.5 34.9(L)  Platelets 140 - 400 Thousand/uL 58(L) 76(L) 70(L)    No results found for: VD25OH  Clinical ASCVD: {YES/NO:21197} The 10-year ASCVD risk score (Arnett DK, et al., 2019) is: 53.9%   Values used to calculate the score:     Age: 1 years     Sex: Female     Is Non-Hispanic African American: No     Diabetic: Yes     Tobacco smoker: Yes     Systolic Blood Pressure: 741 mmHg     Is BP treated: Yes     HDL Cholesterol: 57 mg/dL     Total Cholesterol: 160 mg/dL    Depression screen [facility] 2/9 07/12/2021 09/13/2020 07/27/2020  Decreased Interest 1 0 3  Down, Depressed, Hopeless 1 0 3  PHQ - 2 Score 2 0 6  Altered sleeping 1 - 3  Tired, decreased energy 1 - 3  Change in appetite 0 - 2  Feeling bad or failure about yourself  0 - 3  Trouble concentrating 0 - 2  Moving slowly or fidgety/restless 0 - 2  Suicidal thoughts 0 - 0  PHQ-9 Score 4 - 21  Difficult doing work/chores Somewhat difficult - Extremely dIfficult  Some recent data might be hidden     ***Other: (CHADS2VASc if Afib, MMRC or CAT for COPD, ACT, DEXA)  Social History   Tobacco Use  Smoking Status Every Day   Packs/day: 0.50   Types: Cigarettes  Smokeless Tobacco Never   BP Readings from Last 3 Encounters:  06/01/21 (!) 162/80  01/18/21 128/68  09/13/20 138/84   Pulse Readings from Last 3 Encounters:  06/01/21 66  01/18/21 82  09/13/20 79   Wt Readings from Last 3 Encounters:  07/12/21 206 lb (93.4 kg)  06/01/21 208 lb 12.8 oz (94.7 kg)   01/18/21 202 lb (91.6 kg)   BMI Readings from Last 3 Encounters:  07/12/21 34.28 kg/m  06/01/21 35.29 kg/m  01/18/21 34.14 kg/m    Assessment/Interventions: Review of patient past medical history, allergies, medications, health status, including review of consultants reports, laboratory and other test data, was performed as part of comprehensive evaluation and provision of chronic care management services.   SDOH:  (Social Determinants of Health) assessments and interventions performed: {yes/no:20286}  SDOH Screenings   Alcohol Screen: Low Risk    Last Alcohol Screening Score (AUDIT): 0  Depression (PHQ2-9): Low Risk    PHQ-2 Score: 4  Financial Resource Strain: Low Risk    Difficulty of Paying Living Expenses: Not hard at all  Food Insecurity: No Food Insecurity   Worried About Charity fundraiser in the Last Year: Never true   Ran Out of Food in the Last Year: Never true  Housing: Low Risk    Last Housing Risk Score: 0  Physical Activity: Inactive   Days of Exercise per Week: 0 days   Minutes of Exercise per Session: 0 min  Social Connections: Moderately Integrated   Frequency of Communication with Friends and Family: Three times a week   Frequency of Social Gatherings with Friends and Family: Once a week   Attends Religious Services: 1 to 4 times per year   Active Member of [employer] or Organizations: No   Attends Music therapist: Never   Marital Status: Married  Stress: Stress Concern Present   Feeling of Stress : Rather much  Tobacco Use: High Risk   Smoking Tobacco Use: Every Day   Smokeless Tobacco Use: Never   Passive Exposure: Not on file  Transportation Needs: No Transportation Needs   Lack of Transportation (Medical): No   Lack of Transportation (Non-Medical): No    CCM Care Plan  Allergies  Allergen Reactions   Prednisone Itching    Heart rate increased   Celecoxib Rash   Macrodantin [Nitrofurantoin Macrocrystal] Rash    Medications  Reviewed Today     Reviewed by Chriss Driver, LPN (Licensed Practical Nurse) on 07/12/21 at 77  Med List Status: <None>   Medication Order Taking? Sig Documenting Provider Last Dose Status Informant  ACCU-CHEK AVIVA PLUS test strip [id number] Yes  [provider] Taking Active Self  ACCU-CHEK SOFTCLIX LANCETS lancets [id number] Yes  [provider] Taking Active Self  amLODipine (NORVASC) 10 MG tablet [id number] Yes TAKE 1 TABLET BY MOUTH EVERY DAY [name], MD Taking Active   aspirin EC 81 MG tablet [id number] No Take 1 tablet (81 mg total) by mouth daily.  Patient not taking: Reported on 07/12/2021   [name], MD Not Taking Active Self  atorvastatin (LIPITOR) 20 MG tablet [id number] Yes TAKE 1 TABLET EVERY DAY [name], MD Taking Active   Blood Glucose Monitoring Suppl (ACCU-CHEK AVIVA PLUS) w/Device KIT [id number] Yes  [provider] Taking Active Self  cyanocobalamin ((VITAMIN B-12)) injection 1,000 mcg [id number]   [name], MD  Consider Medication Status and Discontinue (Patient Preference)   FEROSUL 325 (65 Fe) MG tablet [id number] Yes TAKE 1 TABLET EVERY DAY [name], MD Taking Active   furosemide (LASIX) 20 MG tablet [id number] Yes Take 1 tablet (20 mg total) by mouth daily. [name], MD Taking Active   glipiZIDE (GLUCOTROL XL) 10 MG 24 hr tablet [id number] No Take 1 tablet (10 mg total) by mouth daily with breakfast.  Patient not taking: Reported on 07/12/2021   [name], MD Not Taking Active   levothyroxine (SYNTHROID) 88 MCG tablet [id number] Yes Take 1 tablet (88 mcg total) by mouth daily. [name], MD Taking Active   losartan (COZAAR) 100 MG tablet [id number] Yes TAKE 1 TABLET EVERY DAY [name], MD Taking Active   metFORMIN (GLUCOPHAGE-XR) 500 MG 24 hr tablet [id number] Yes TAKE 2 TABLETS TWICE DAILY [name],  MD Taking Active   pantoprazole (PROTONIX) 40 MG tablet  [id number] Yes TAKE 1 TABLET EVERY DAY [name], MD Taking Active   pioglitazone (ACTOS) 30 MG tablet [id number] No TAKE 1 TABLET EVERY DAY  Patient not taking: Reported on 07/12/2021   [name], MD Not Taking Active   potassium chloride (KLOR-CON) 10 MEQ tablet [id number] Yes TAKE 2 TABLETS (20 MEQ TOTAL) BY MOUTH DAILY. [name], MD Taking Active             Patient Active Problem List   Diagnosis Date Noted   Pain due to onychomycosis of toenails of both feet 12/18/2020   Acute on chronic diastolic heart failure ([place]) 04/21/2019   LBBB (left bundle branch block) 04/21/2019   S/P TAVR (transcatheter aortic valve replacement) 04/20/2019   Obesity    NASH (nonalcoholic steatohepatitis)    Tobacco abuse    Coronary artery disease involving native coronary artery without angina pectoris    Incidental pulmonary nodule, greater than or equal to 34m 10/14/2018   Severe aortic stenosis    Esophageal varices determined by endoscopy ([place]    Polyp of transverse colon    Esophageal varices without bleeding ([place]    Portal hypertensive gastropathy ([place]    Iron deficiency anemia 03/06/2018   Vitamin B 12 deficiency 09/16/2016   Dementia ([place] 09/16/2016   Splenomegaly 09/16/2016   Liver cirrhosis secondary to NASH ([place] 09/16/2016   Thrombocytopenia ([place] 09/15/2016   DM (diabetes mellitus), type 2 ([place] 09/15/2016   Hypertension 09/14/2016   Depression 09/14/2016   DIVERTICULITIS OF COLON 06/14/2008   Irritable bowel syndrome 04/06/2008    Immunization History  Administered Date(s) Administered   Fluad Quad(high Dose 65+) 04/21/2019, 04/28/2020   Influenza, High Dose Seasonal PF 06/02/2017, 04/15/2018   Influenza-Unspecified 06/05/2016   PFIZER(Purple Top)SARS-COV-2 Vaccination 07/10/2020   Pneumococcal Polysaccharide-23 04/21/2019    Conditions to be addressed/monitored:  HTN, CAD, DM, Dementia.Depression, Tobacco Use  There are no care plans  that you recently modified to display for this patient.    Medication Assistance: {MEDASSISTANCEINFO:25044}  Compliance/Adherence/Medication fill history: Care Gaps: ***  Star-Rating Drugs: ***  Patient's preferred pharmacy is:  [id number] [place], [place] - [address]: [phone]: [phone] [place]: [phone]: [phone][address]one: [phone]: [phone] pill box? {Yes or If no, why not?:20788} Pt endorses ***% compliance  We discussed: {Pharmacy options:24294} Patient decided to: {US Pharmacy Plan:23885}  Care Plan and Follow Up Patient Decision:  {FOLLOWUP:24991}  Plan: {CM FOLLOW UP PLJQBH:41937}***  Current Barriers:  {pharmacybarriers:24917}  Pharmacist Clinical Goal(s):  Patient will {PHARMACYGOALCHOICES:24921} through collaboration with PharmD and provider.   Interventions: 1:1 collaboration with PiSusy FrizzleMD regarding development and update of comprehensive plan of care as evidenced by provider attestation and co-signature Inter-disciplinary care team collaboration (see longitudinal plan of care) Comprehensive medication review performed; medication list updated in electronic medical record  Hypertension (BP goal {CHL HP UPSTREAM Pharmacist BP [phone]}) -{US controlled/uncontrolled:25276} -Current treatment: *** -Medications previously tried: ***  -Current home readings: *** -Current dietary habits: *** -Current exercise habits: *** -{ACTIONS;DENIES/REPORTS:21021675::"Denies"} hypotensive/hypertensive symptoms -Educated on {CCM BP Counseling:25124} -Counseled to  monitor BP at home  ***, document, and provide log at future appointments -{CCMPHARMDINTERVENTION:25122}  Hyperlipidemia: (LDL goal < ***) -{US controlled/uncontrolled:25276} -Current treatment: *** -Medications previously tried: ***  -Current dietary patterns: *** -Current exercise habits: *** -Educated on {CCM HLD Counseling:25126} -{CCMPHARMDINTERVENTION:25122}  Diabetes (A1c goal {A1c goals:23924}) -{US controlled/uncontrolled:25276} -Current medications: *** -Medications previously tried: ***  -Current home glucose readings fasting glucose: *** post prandial glucose: *** -{ACTIONS;DENIES/REPORTS:21021675::"Denies"} hypoglycemic/hyperglycemic symptoms -Current meal patterns:  breakfast: ***  lunch: ***  dinner: *** snacks: *** drinks: *** -Current exercise: *** -Educated on {CCM DM COUNSELING:25123} -Counseled to check feet daily and get yearly eye exams -{CCMPHARMDINTERVENTION:25122}  Depression/Anxiety (Goal: ***) -{US controlled/uncontrolled:25276} -Current treatment: *** -Medications previously tried/failed: *** -PHQ9: *** -GAD7: *** -Connected with *** for mental health support -Educated on {CCM mental health counseling:25127} -{CCMPHARMDINTERVENTION:25122}  Tobacco use (Goal ***) -{US controlled/uncontrolled:25276} -Previous quit attempts: *** -Current treatment  *** -Patient smokes {Time to first cigarette:23873} -Patient triggers include: {Smoking Triggers:23882} -On a scale of 1-10, reports MOTIVATION to quit is *** -On a scale of 1-10, reports CONFIDENCE in quitting is *** -{Smoking Cessation Counseling:23883} -{CCMPHARMDINTERVENTION:25122}  Dementia (Goal: ***) -{US controlled/uncontrolled:25276} -Current treatment  *** -Medications previously tried: ***  -{CCMPHARMDINTERVENTION:25122}  Patient Goals/Self-Care Activities Patient will:  - {pharmacypatientgoals:24919}  Follow Up Plan: {CM FOLLOW UP MBPJ:12162}

## 2021-08-13 ENCOUNTER — Telehealth: Payer: Self-pay | Admitting: Pharmacist

## 2021-08-13 NOTE — Chronic Care Management (AMB) (Signed)
Chronic Care Management Pharmacy Assistant   Name: Kelly [name]  MRN: [id number] DOB: [birth year]   Reason for Encounter: Chart Prep For Initial Visit With Clinical Pharamcist   Conditions to be addressed/monitored: HTN, NASH, DM II, Hypothyroidism,  Dementia, Anxiety  Primary concerns for visit include: HTN, Anxiety  Recent office visits:  06/01/2021 OV (PCP) Kelly [name]; "I have recommended that we stop Ambien and Klonopin and avoid any centrally acting medication due to recent events.  I was honest with the patient about my concerns and explained to her in detail why I was worried about using Klonopin and Ambien.  Therefore I will no longer refill these medications. If her lab work is stable, if she insist on managing her anxiety, I will try sertraline 50 mg p.o. nightly as I feel that this is much safer for her and less apt to cause falls and confusion."  03/09/2021 VV (PCP) Kelly [name];  "I recommended stopping the Ambien at night as the patient discussed.  We will start Klonopin 0.5 mg tablets.  She can take 1/2 to 1 tablet every 12 hours as needed for anxiety.  I will give her 30 tablets and I have encouraged her to use these sparingly.  I would be extremely happy if these 30 tablets lasts her 8 months again.  Have also cautioned her to be careful and not to combine the medication with hydrocodone which she takes perhaps once a day for pain in her back."  Recent consult visits:  05/11/2021 OV (Podiatry) Kelly [name]; no medication changes indicated.  Hospital visits:  None in previous 6 months  Medications: Outpatient Encounter Medications as of 08/13/2021  Medication Sig   ACCU-CHEK AVIVA PLUS test strip    ACCU-CHEK SOFTCLIX LANCETS lancets    amLODipine (NORVASC) 10 MG tablet TAKE 1 TABLET BY MOUTH EVERY DAY   aspirin EC 81 MG tablet Take 1 tablet (81 mg total) by mouth daily. (Patient not taking: Reported on 07/12/2021)   atorvastatin (LIPITOR) 20  MG tablet TAKE 1 TABLET EVERY DAY   Blood Glucose Monitoring Suppl (ACCU-CHEK AVIVA PLUS) w/Device KIT    cephALEXin (KEFLEX) 500 MG capsule Take 1 capsule (500 mg total) by mouth 3 (three) times daily.   FEROSUL 325 (65 Fe) MG tablet TAKE 1 TABLET EVERY DAY   furosemide (LASIX) 20 MG tablet Take 1 tablet (20 mg total) by mouth daily.   glipiZIDE (GLUCOTROL XL) 10 MG 24 hr tablet Take 1 tablet (10 mg total) by mouth daily with breakfast. (Patient not taking: Reported on 07/12/2021)   levothyroxine (SYNTHROID) 88 MCG tablet Take 1 tablet (88 mcg total) by mouth daily.   losartan (COZAAR) 100 MG tablet TAKE 1 TABLET EVERY DAY   metFORMIN (GLUCOPHAGE-XR) 500 MG 24 hr tablet TAKE 2 TABLETS TWICE DAILY   pantoprazole (PROTONIX) 40 MG tablet TAKE 1 TABLET EVERY DAY   pioglitazone (ACTOS) 30 MG tablet TAKE 1 TABLET EVERY DAY (Patient not taking: Reported on 07/12/2021)   potassium chloride (KLOR-CON) 10 MEQ tablet TAKE 2 TABLETS (20 MEQ TOTAL) BY MOUTH DAILY.   Facility-Administered Encounter Medications as of 08/13/2021  Medication   cyanocobalamin ((VITAMIN B-12)) injection 1,000 mcg   Current Medications: Cephalexin 500 mg last filled 03/29/2021 7 DS - recently filled per patient Pantoprazole 40 mg last filled 06/17/2021 90 DS Levothyroxine 88 mcg last filled 06/05/2021 90 DS Potassium chloride 10 meq last filled 08/08/2021 90 DS Furosemide 20 mg last filled 06/08/2021  90 DS Losartan 100 mg last filled 06/19/2021 90 DS Metformin 500 mg last filled 06/19/2021 90 DS Pioglitazone 30 mg last filled 02/16/2021 90 DS  Ferosul 325 mg last filled 12/07/2020 90 DS Atorvastatin 20 mg last filled 06/06/2021 90 DS Glipizide 10 mg last filled 05/09/2020 90 DS Amlodipine 10 mg last filled 11/21/2019 90 DS Aspirin 81 mg Vitamin B-12 Accu-Chek Aviva Plus Accu-Check Softclix lancets  Patient Questions: Any changes in your medications or health? Patient states her son manages her medications for her. She  she is not sure what exactly she is or is not taking. She will try to have her medications available during her appointment.  Any side effects from any medications?  Patient states sometimes when she takes antibiotics they don't "agree" with her. She denies having any other side effects from any of her medications.  Do you have any symptoms or problems not managed by your medications? Patient denies having any symptoms or problems that are not currently managed by her medications.  Any concerns about your health right now? Patient states her husband recently had his leg amputated a few weeks ago which causes a lot of stress for her.  Has your provider asked that you check blood pressure, blood sugar, or follow special diet at home? She usually checks her blood pressure but hasn't recently. She does not check blood sugar. She does not follow any specific diet. She states she "has no appetite for much of anything." She's tired from taking care of her husband and doesn't eat as much as she used to.  Do you get any type of exercise on a regular basis? "I get plenty of exercise."  Can you think of a goal you would like to reach for your health? "Get passed my stress so I can feel better."  Do you have any problems getting your medications? She denies having any problems getting her medications.  Is there anything that you would like to discuss during the appointment?  Patient states she doesn't have anything that she would like to discuss.  Please bring medications and supplements to appointment  Care Gaps: Medicare Annual Wellness: Completed 07/12/2021 Ophthalmology Exam: Kelly [name] - never done Foot Exam: Next due on 05/11/2022 Hemoglobin A1C: 4.8% on 06/01/2021 Colonoscopy: Next due on 04/09/2028 Dexa Scan: Overdue - never done Mammogram: Ordered on 01/03/2021  -Patient has issues with transportation. She doesn't drive and her husband recently had limb amputated. So she is unable to get  out to complete her care gaps.  Future Appointments  Date Time Provider [place]  08/16/2021  9:00 AM [facility]-CCM PHARMACIST [facility] None  08/22/2021 11:00 AM Kelly [name]  07/18/2022 11:15 AM [facility]-NURSE HEALTH ADVISOR [facility] None   Star Rating Drugs: Losartan 100 mg last filled 06/19/2021 90 DS Metformin 500 mg last filled 06/19/2021 90 DS Pioglitazone 30 mg last filled 02/16/2021 90 DS Atorvastatin 20 mg last filled 06/06/2021 90 DS Glipizide 10 mg last filled 05/09/2020 90 DS  April D Calhoun, [place] Pharmacist Assistant [phone]

## 2021-08-16 ENCOUNTER — Telehealth: Payer: Medicare HMO

## 2021-08-16 NOTE — Progress Notes (Signed)
Chronic Care Management Pharmacy Note  08/10/2021 Name:  Kelly [name] MRN:  [id number] DOB:  Apr 11, 1951  Summary: Initial visit with PharmD.  A1c on the low end, she is having some symptoms of hypoglycemia.  Unclear if she is still taking Actos although not on fill history.  Have asked her to monitor glucose and report back to me if she is still taking Actos.  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 year old female who is a primary patient of Pickard, [name], MD.  The CCM team was consulted for assistance with disease management and care coordination needs.    Engaged with patient by telephone for initial visit in response to provider referral for pharmacy case management and/or care coordination services.   Consent to Services:  The patient was given the following information about Chronic Care Management services today, agreed to services, and gave verbal consent: 1. CCM service includes personalized support from designated clinical staff supervised by the primary care provider, including individualized plan of care and coordination with other care providers 2. 24/7 contact phone numbers for assistance for urgent and routine care needs. 3. Service will only be billed when office clinical staff spend 20 minutes or more in a month to coordinate care. 4. Only one practitioner [place] furnish and bill the service in a calendar month. 5.The patient Kelly [name] stop CCM services at any time (effective at the end of the month) by phone call to the office staff. 6. The patient will be responsible for cost sharing (co-pay) of up to 20% of the service fee (after annual deductible is met). Patient agreed to services and consent obtained.  Patient Care Team: [name], MD as PCP - General (Family Medicine) [name], MD as PCP - Cardiology (Cardiology) [name], [facility] as Pharmacist (Pharmacist)  Recent office visits:  06/01/2021 OV (PCP) [name], MD; "I have recommended that we stop Ambien and Klonopin and avoid any centrally acting medication due to recent events.  I was honest with the patient about my concerns and explained to her in detail why I was worried about using Klonopin and Ambien.  Therefore I will no longer refill these medications. If her lab work is stable, if she insist on managing her anxiety, I will try sertraline 50 mg p.o. nightly as I feel that this is much safer for her and less apt to cause falls and confusion."   03/09/2021 VV (PCP) [name], MD;  "I recommended stopping the Ambien at night as the patient discussed.  We will start Klonopin 0.5 mg tablets.  She can take 1/2 to 1 tablet every 12 hours as needed for anxiety.  I will give her 30 tablets and I have encouraged her to use these sparingly.  I would be extremely happy if these 30 tablets lasts her 8 months again.  Have also cautioned her to be careful and not to combine the medication with hydrocodone which she takes perhaps once a day for pain in her back."   Recent consult visits:  05/11/2021 OV (Podiatry) [name], DPM; no medication changes indicated.   Hospital visits:  None in previous 6 months   Objective:  Lab Results  Component Value Date  CREATININE 0.88 06/01/2021   BUN 11 06/01/2021   GFR 71.88 02/03/2019   GFRNONAA 89 01/18/2021   GFRAA 103 01/18/2021   NA 141 06/01/2021   K 3.9 06/01/2021   CALCIUM 9.6 06/01/2021   CO2 22 06/01/2021   GLUCOSE 104 (H) 06/01/2021    Lab Results  Component Value Date/Time   HGBA1C 4.8 06/01/2021 01:07 PM   HGBA1C 5.6 10/13/2020 02:18 PM   GFR 71.88 02/03/2019 09:37 AM   GFR 80.02 07/13/2018 10:16 AM   MICROALBUR 1.5 01/24/2020 03:28 PM   MICROALBUR 1.7  09/08/2017 12:04 PM    Last diabetic Eye exam: No results found for: HMDIABEYEEXA  Last diabetic Foot exam: No results found for: HMDIABFOOTEX   Lab Results  Component Value Date   CHOL 160 06/01/2021   HDL 57 06/01/2021   LDLCALC 83 06/01/2021   TRIG 106 06/01/2021   CHOLHDL 2.8 06/01/2021    Hepatic Function Latest Ref Rng & Units 06/01/2021 01/18/2021 10/13/2020  Total Protein 6.1 - 8.1 g/dL 6.6 6.7 6.4  Albumin 3.5 - 5.0 g/dL - - -  AST 10 - 35 U/L 23 20 21   ALT 6 - 29 U/L 13 13 12   Alk Phosphatase 38 - 126 U/L - - -  Total Bilirubin 0.2 - 1.2 mg/dL 1.2 1.0 0.8    Lab Results  Component Value Date/Time   TSH 5.59 (H) 06/01/2021 01:07 PM   TSH 3.04 10/13/2020 02:18 PM    CBC Latest Ref Rng & Units 06/01/2021 01/18/2021 10/13/2020  WBC 3.8 - 10.8 Thousand/uL 3.8 6.7 5.1  Hemoglobin 11.7 - 15.5 g/dL 11.8 12.0 11.9  Hematocrit 35.0 - 45.0 % 34.9(L) 35.5 34.9(L)  Platelets 140 - 400 Thousand/uL 58(L) 76(L) 70(L)    No results found for: VD25OH  Clinical ASCVD: Yes  The 10-year ASCVD risk score (Arnett DK, et al., 2019) is: 53.9%   Values used to calculate the score:     Age: 85 years     Sex: Female     Is Non-Hispanic African American: No     Diabetic: Yes     Tobacco smoker: Yes     Systolic Blood Pressure: 676 mmHg     Is BP treated: Yes     HDL Cholesterol: 57 mg/dL     Total Cholesterol: 160 mg/dL    Depression screen [facility] 2/9 07/12/2021 09/13/2020 07/27/2020  Decreased Interest 1 0 3  Down, Depressed, Hopeless 1 0 3  PHQ - 2 Score 2 0 6  Altered sleeping 1 - 3  Tired, decreased energy 1 - 3  Change in appetite 0 - 2  Feeling bad or failure about yourself  0 - 3  Trouble concentrating 0 - 2  Moving slowly or fidgety/restless 0 - 2  Suicidal thoughts 0 - 0  PHQ-9 Score 4 - 21  Difficult doing work/chores Somewhat difficult - Extremely dIfficult  Some recent data might be hidden     Social History   Tobacco Use  Smoking Status Every Day   Packs/day:  0.50   Types: Cigarettes  Smokeless Tobacco Never   BP Readings from Last 3 Encounters:  06/01/21 (!) 162/80  01/18/21 128/68  09/13/20 138/84   Pulse Readings from Last 3 Encounters:  06/01/21 66  01/18/21 82  09/13/20 79   Wt Readings from Last 3 Encounters:  07/12/21 206 lb (93.4 kg)  06/01/21 208 lb 12.8 oz (94.7 kg)  01/18/21 202 lb (91.6 kg)   BMI Readings from Last  3 Encounters:  07/12/21 34.28 kg/m  06/01/21 35.29 kg/m  01/18/21 34.14 kg/m    Assessment/Interventions: Review of patient past medical history, allergies, medications, health status, including review of consultants reports, laboratory and other test data, was performed as part of comprehensive evaluation and provision of chronic care management services.   SDOH:  (Social Determinants of Health) assessments and interventions performed: Yes  Financial Resource Strain: Low Risk    Difficulty of Paying Living Expenses: Not hard at all    SDOH Screenings   Alcohol Screen: Low Risk    Last Alcohol Screening Score (AUDIT): 0  Depression (PHQ2-9): Low Risk    PHQ-2 Score: 4  Financial Resource Strain: Low Risk    Difficulty of Paying Living Expenses: Not hard at all  Food Insecurity: No Food Insecurity   Worried About Charity fundraiser in the Last Year: Never true   Ran Out of Food in the Last Year: Never true  Housing: Low Risk    Last Housing Risk Score: 0  Physical Activity: Inactive   Days of Exercise per Week: 0 days   Minutes of Exercise per Session: 0 min  Social Connections: Moderately Integrated   Frequency of Communication with Friends and Family: Three times a week   Frequency of Social Gatherings with Friends and Family: Once a week   Attends Religious Services: 1 to 4 times per year   Active Member of [employer] or Organizations: No   Attends Music therapist: Never   Marital Status: Married  Stress: Stress Concern Present   Feeling of Stress : Rather much  Tobacco Use:  High Risk   Smoking Tobacco Use: Every Day   Smokeless Tobacco Use: Never   Passive Exposure: Not on file  Transportation Needs: No Transportation Needs   Lack of Transportation (Medical): No   Lack of Transportation (Non-Medical): No    CCM Care Plan  Allergies  Allergen Reactions   Prednisone Itching    Heart rate increased   Celecoxib Rash   Macrodantin [Nitrofurantoin Macrocrystal] Rash    Medications Reviewed Today     Reviewed by Chriss Driver, LPN (Licensed Practical Nurse) on 07/12/21 at 36  Med List Status: <None>   Medication Order Taking? Sig Documenting Provider Last Dose Status Informant  ACCU-CHEK AVIVA PLUS test strip [id number] Yes  [provider] Taking Active Self  ACCU-CHEK SOFTCLIX LANCETS lancets [id number] Yes  [provider] Taking Active Self  amLODipine (NORVASC) 10 MG tablet [id number] Yes TAKE 1 TABLET BY MOUTH EVERY DAY [name], MD Taking Active   aspirin EC 81 MG tablet [id number] No Take 1 tablet (81 mg total) by mouth daily.  Patient not taking: Reported on 07/12/2021   [name], MD Not Taking Active Self  atorvastatin (LIPITOR) 20 MG tablet [id number] Yes TAKE 1 TABLET EVERY DAY [name], MD Taking Active   Blood Glucose Monitoring Suppl (ACCU-CHEK AVIVA PLUS) w/Device KIT [id number] Yes  [provider] Taking Active Self  cyanocobalamin ((VITAMIN B-12)) injection 1,000 mcg [id number]   [name], MD  Consider Medication Status and Discontinue (Patient Preference)   FEROSUL 325 (65 Fe) MG tablet [id number] Yes TAKE 1 TABLET EVERY DAY [name], MD Taking Active   furosemide (LASIX) 20 MG tablet [id number] Yes Take 1 tablet (20 mg total) by mouth daily. [name], MD Taking Active   glipiZIDE (GLUCOTROL XL) 10 MG 24 hr tablet [id number] No Take 1 tablet (10  mg total) by mouth daily with breakfast.  Patient not taking: Reported on 07/12/2021   [name], MD  Not Taking Active   levothyroxine (SYNTHROID) 88 MCG tablet [id number] Yes Take 1 tablet (88 mcg total) by mouth daily. [name], MD Taking Active   losartan (COZAAR) 100 MG tablet [id number] Yes TAKE 1 TABLET EVERY DAY [name], MD Taking Active   metFORMIN (GLUCOPHAGE-XR) 500 MG 24 hr tablet [id number] Yes TAKE 2 TABLETS TWICE DAILY [name], MD Taking Active   pantoprazole (PROTONIX) 40 MG tablet [id number] Yes TAKE 1 TABLET EVERY DAY [name], MD Taking Active   pioglitazone (ACTOS) 30 MG tablet [id number] No TAKE 1 TABLET EVERY DAY  Patient not taking: Reported on 07/12/2021   [name], MD Not Taking Active   potassium chloride (KLOR-CON) 10 MEQ tablet [id number] Yes TAKE 2 TABLETS (20 MEQ TOTAL) BY MOUTH DAILY. [name], MD Taking Active             Patient Active Problem List   Diagnosis Date Noted   Pain due to onychomycosis of toenails of both feet 12/18/2020   Acute on chronic diastolic heart failure ([place]) 04/21/2019   LBBB (left bundle branch block) 04/21/2019   S/P TAVR (transcatheter aortic valve replacement) 04/20/2019   Obesity    NASH (nonalcoholic steatohepatitis)    Tobacco abuse    Coronary artery disease involving native coronary artery without angina pectoris    Incidental pulmonary nodule, greater than or equal to 83m 10/14/2018   Severe aortic stenosis    Esophageal varices determined by endoscopy ([facility]    Polyp of transverse colon    Esophageal varices without bleeding ([facility]    Portal hypertensive gastropathy ([place]    Iron deficiency anemia 03/06/2018   Vitamin B 12 deficiency 09/16/2016   Dementia ([place] 09/16/2016   Splenomegaly 09/16/2016   Liver cirrhosis secondary to NASH ([facility] 09/16/2016   Thrombocytopenia ([facility] 09/15/2016   DM (diabetes mellitus), type 2 ([place] 09/15/2016   Hypertension 09/14/2016   Depression 09/14/2016   DIVERTICULITIS OF COLON 06/14/2008   Irritable bowel syndrome 04/06/2008     Immunization History  Administered Date(s) Administered   Fluad Quad(high Dose 65+) 04/21/2019, 04/28/2020   Influenza, High Dose Seasonal PF 06/02/2017, 04/15/2018   Influenza-Unspecified 06/05/2016   PFIZER(Purple Top)SARS-COV-2 Vaccination 07/10/2020   Pneumococcal Polysaccharide-23 04/21/2019    Conditions to be addressed/monitored:  HTN, CAD, DM, Dementia.Depression, Tobacco Use  There are no care plans that you recently modified to display for this patient.    Medication Assistance: None required.  Patient affirms current coverage meets needs.  Compliance/Adherence/Medication fill history: Care Gaps: Eye Exam DEXA  Star-Rating Drugs: Losartan 100 mg last filled 06/19/2021 90 DS Metformin 500 mg last filled 06/19/2021 90 DS Atorvastatin 20 mg last filled 06/06/2021 90 DS  Patient's preferred pharmacy is:  [id number] [place], [place] - [address]: [phone]: [phone]: [phone]: [phone] - [address]: [phone]: [phone] pill box? Yes Pt endorses 100% compliance  We discussed: Benefits of medication synchronization, packaging and delivery as well as enhanced pharmacist oversight with [employer]. Patient decided to: Consider  pending price  Care Plan and Follow Up Patient Decision:  Patient agrees to Care Plan and Follow-up.  Plan: The care management team will reach out to the patient again over the next 120 days.  Beverly Milch, PharmD, CPP Clinical Pharmacist Practitioner [place] [phone]

## 2021-08-21 ENCOUNTER — Other Ambulatory Visit: Payer: Self-pay | Admitting: Family Medicine

## 2021-08-22 ENCOUNTER — Ambulatory Visit: Payer: Medicare HMO | Admitting: Podiatry

## 2021-08-24 ENCOUNTER — Ambulatory Visit (INDEPENDENT_AMBULATORY_CARE_PROVIDER_SITE_OTHER): Payer: Medicare HMO | Admitting: Pharmacist

## 2021-08-24 DIAGNOSIS — I2510 Atherosclerotic heart disease of native coronary artery without angina pectoris: Secondary | ICD-10-CM

## 2021-08-24 DIAGNOSIS — I10 Essential (primary) hypertension: Secondary | ICD-10-CM

## 2021-08-24 DIAGNOSIS — F332 Major depressive disorder, recurrent severe without psychotic features: Secondary | ICD-10-CM

## 2021-08-24 DIAGNOSIS — E119 Type 2 diabetes mellitus without complications: Secondary | ICD-10-CM

## 2021-08-24 NOTE — Patient Instructions (Addendum)
Visit Information   Goals Addressed             This Visit's Progress    Monitor and Manage My Blood Sugar-Diabetes Type 2       Timeframe:  Long-Range Goal Priority:  High Start Date:   08/24/21                          Expected End Date: 02/24/22                      Follow Up Date 11/25/21    - check blood sugar at prescribed times - check blood sugar if I feel it is too high or too low - enter blood sugar readings and medication or insulin into daily log    Why is this important?   Checking your blood sugar at home helps to keep it from getting very high or very low.  Writing the results in a diary or log helps the doctor know how to care for you.  Your blood sugar log should have the time, date and the results.  Also, write down the amount of insulin or other medicine that you take.  Other information, like what you ate, exercise done and how you were feeling, will also be helpful.     Notes:        Patient Care Plan: General Pharmacy (Adult)     Problem Identified: HTN, CAD, DM, Depression, Hypothyroidism   Priority: High  Onset Date: 08/24/2021     Long-Range Goal: Patient-Specific Goal   Start Date: 08/24/2021  Expected End Date: 02/21/2022  This Visit's Progress: On track  Priority: High  Note:   Current Barriers:  Unable to achieve control of LDL Possible hypoglycemia   Pharmacist Clinical Goal(s):  Patient will achieve improvement in LDL as evidenced by labs through collaboration with PharmD and provider.   Interventions: 1:1 collaboration with [name], MD regarding development and update of comprehensive plan of care as evidenced by provider attestation and co-signature Inter-disciplinary care team collaboration (see longitudinal plan of care) Comprehensive medication review performed; medication list updated in electronic medical record  Hypertension (BP goal <130/80) -Controlled -Current treatment: Losartan 139m daily Appropriate,  Effective, Safe, Accessible  -Medications previously tried: metoprolol, nadolol, propranolol  -Current home readings: no logs provided today -Current dietary habits: usually only eating breakfast and lunch, does not have much of an appetite lately -Current exercise habits: "plenty" takes care of her husband, nothing routine -Reports hypotensive/hypertensive symptoms - occasional dizziness -Educated on Exercise goal of 150 minutes per week; Importance of home blood pressure monitoring; Symptoms of hypotension and importance of maintaining adequate hydration; -Counseled to monitor BP at home a few times per week, document, and provide log at future appointments -Recommended to continue current medication Counseled on going slow when switching from sitting to standing positions  Hyperlipidemia: (LDL goal < 70) -Not ideally controlled -Current treatment: Atorvastatin 246mdaily Query Appropriate, -Medications previously tried: none noted  -Current dietary patterns: see HTN -Current exercise habits: see HTN -Educated on Cholesterol goals;  Benefits of statin for ASCVD risk reduction; Importance of limiting foods high in cholesterol; -Recommended to continue current medication Assessed current ASCVD risk, which is > 50%.  Patient most recent LDL above goal of < 70. Could consider increasing to high intensity statin (Atorvastatin 4055maily) for better chance of meeting goal < 70 to further reduce CV risk. She denies any adverse  effects at this time.  Diabetes (A1c goal <7%) -Controlled -Current medications: Metformin XR 59m two tablets po BID Appropriate, Effective, Query Safe Pioglitazone 371mdaily?? Query Appropriate -Medications previously tried: glipizide, glimerpiride  -Current home glucose readings fasting glucose: unknown post prandial glucose: unknown -Reports hypoglycemic/hyperglycemic symptoms - shakiness later in the day -Current meal patterns: inconsistent meals,  skipping lunch often due to low appetite -Current exercise: caring for husband -Educated on A1c and blood sugar goals; Prevention and management of hypoglycemic episodes; Benefits of routine self-monitoring of blood sugar; -Counseled to check feet daily and get yearly eye exams -Recommended to continue current medication Patient to let me know if she is taking Actos or not.  It is not in fill history so I do not believe she is.  If so would recommend to d/c due to lower A1c.  Also could consider decreasing her metformin due to most recent A1c and her recent low appetite and hypoglycemic symptoms. Will f/u on Actos - have asked her to check sugar a few times per day. If having lower sugars then would consider decrease of metformin.  Depression/Anxiety (Goal: Minimize symptoms) -Controlled -Current treatment: None -Medications previously tried/failed: sertraline, clonazepam, trazodone -PHQ9:  PHQ9 SCORE ONLY 07/12/2021 09/13/2020 07/27/2020  PHQ-9 Total Score 4 0 21  -GAD7:  GAD 7 : Generalized Anxiety Score 07/27/2020  Nervous, Anxious, on Edge 3  Control/stop worrying 2  Worry too much - different things 3  Trouble relaxing 3  Restless 3  Easily annoyed or irritable 2  Afraid - awful might happen 3  Total GAD 7 Score 19  Anxiety Difficulty Extremely difficult  -reports stable mood, sleep has been ok since husband is out of hospital.  Tried trazodone but is worried about not waking up if husband needs her. -Recommended monitor symptoms contact providers with new or worsening depression.  Hypothyroidism (Goal: maintain TSH) -Controlled -Current treatment  Levothyroxine 8859mdaily Appropriate, Query effective, -Medications previously tried: none noted -Recently increased dose to 60m47maily, taking appropriately. -Has not had TSH checked since increasing dose.  -Recommended to continue current medication Recommend recheck TSH for efficacy of current dose.  Patient  Goals/Self-Care Activities Patient will:  - focus on medication adherence by pill box check glucose a few times per week, document, and provide at future appointments check blood pressure weekly, document, and provide at future appointments  Follow Up Plan: The care management team will reach out to the patient again over the next 120 days.      Ms. Sidor was given information about Chronic Care Management services today including:  CCM service includes personalized support from designated clinical staff supervised by her physician, including individualized plan of care and coordination with other care providers 24/7 contact phone numbers for assistance for urgent and routine care needs. Standard insurance, coinsurance, copays and deductibles apply for chronic care management only during months in which we provide at least 20 minutes of these services. Most insurances cover these services at 100%, however patients [place] be responsible for any copay, coinsurance and/or deductible if applicable. This service [place] help you avoid the need for more expensive face-to-face services. Only one practitioner [place] furnish and bill the service in a calendar month. The patient [place] stop CCM services at any time (effective at the end of the month) by phone call to the office staff.  Patient agreed to services and verbal consent obtained.   The patient verbalized understanding of instructions, educational materials, and care plan provided today and agreed  to receive a mailed copy of patient instructions, educational materials, and care plan.  Telephone follow up appointment with pharmacy team member scheduled for: 4 months  [name], [place], PharmD, [place] Clinical Pharmacist Practitioner [place] [phone]

## 2021-08-29 NOTE — Telephone Encounter (Signed)
Refill request received for Trazodone.  Last refill 07/17/21, #15  Please review, thanks!

## 2021-08-29 NOTE — Addendum Note (Signed)
Addended by: Wadie Lessen on: 08/29/2021 10:50 AM   Modules accepted: Orders

## 2021-08-30 ENCOUNTER — Other Ambulatory Visit: Payer: Self-pay | Admitting: Family Medicine

## 2021-08-30 MED ORDER — TRAZODONE HCL 50 MG PO TABS: 50.0000 mg | ORAL_TABLET | Freq: Every evening | ORAL | 3 refills | Status: DC | PRN

## 2021-09-03 ENCOUNTER — Telehealth: Payer: Self-pay

## 2021-09-03 NOTE — Telephone Encounter (Signed)
Pt called to report she is having left knee and shoulder pain. Pt states you are aware she cannot come into office for evaluation at this time. Pt is requesting you call her in something to help with her pain.  Please advise, thanks!

## 2021-09-04 DIAGNOSIS — I10 Essential (primary) hypertension: Secondary | ICD-10-CM

## 2021-09-04 DIAGNOSIS — E119 Type 2 diabetes mellitus without complications: Secondary | ICD-10-CM

## 2021-09-04 DIAGNOSIS — I2510 Atherosclerotic heart disease of native coronary artery without angina pectoris: Secondary | ICD-10-CM

## 2021-09-04 DIAGNOSIS — F332 Major depressive disorder, recurrent severe without psychotic features: Secondary | ICD-10-CM

## 2021-09-04 NOTE — Telephone Encounter (Signed)
Spoke with pt and advised of need for OV. Pt states she is feeling much better today. Pt asked about knee brace or other options to help with pain. Advised pt she can try neoprene knee sleeve for extra support, diclofenac gel for pain relief, ice/moist heat for pain relief. Pt voiced understanding and will call to schedule OV when she is able to come in to office. Nothing further needed at this time.

## 2021-09-17 ENCOUNTER — Telehealth: Payer: Self-pay | Admitting: Pharmacist

## 2021-09-17 NOTE — Progress Notes (Addendum)
Chronic Care Management Pharmacy Assistant   Name: Kelly [name]  MRN: [id number] DOB: Mar 04, 1951   Reason for Encounter: Diabetes Adherence Call    Recent office visits:  None  Recent consult visits:  None  Hospital visits:  None in previous 6 months  Medications: Outpatient Encounter Medications as of 09/17/2021  Medication Sig   ACCU-CHEK AVIVA PLUS test strip    ACCU-CHEK SOFTCLIX LANCETS lancets    amLODipine (NORVASC) 10 MG tablet TAKE 1 TABLET BY MOUTH EVERY DAY (Patient not taking: Reported on 08/24/2021)   aspirin EC 81 MG tablet Take 1 tablet (81 mg total) by mouth daily.   atorvastatin (LIPITOR) 20 MG tablet TAKE 1 TABLET EVERY DAY   Blood Glucose Monitoring Suppl (ACCU-CHEK AVIVA PLUS) w/Device KIT    cephALEXin (KEFLEX) 500 MG capsule Take 1 capsule (500 mg total) by mouth 3 (three) times daily.   FEROSUL 325 (65 Fe) MG tablet TAKE 1 TABLET EVERY DAY   furosemide (LASIX) 20 MG tablet Take 1 tablet (20 mg total) by mouth daily.   glipiZIDE (GLUCOTROL XL) 10 MG 24 hr tablet Take 1 tablet (10 mg total) by mouth daily with breakfast. (Patient not taking: Reported on 07/12/2021)   levothyroxine (SYNTHROID) 88 MCG tablet Take 1 tablet (88 mcg total) by mouth daily.   losartan (COZAAR) 100 MG tablet TAKE 1 TABLET EVERY DAY   metFORMIN (GLUCOPHAGE-XR) 500 MG 24 hr tablet TAKE 2 TABLETS TWICE DAILY   pantoprazole (PROTONIX) 40 MG tablet TAKE 1 TABLET EVERY DAY   pioglitazone (ACTOS) 30 MG tablet TAKE 1 TABLET EVERY DAY   potassium chloride (KLOR-CON) 10 MEQ tablet TAKE 2 TABLETS (20 MEQ TOTAL) BY MOUTH DAILY.   traZODone (DESYREL) 50 MG tablet Take 1 tablet (50 mg total) by mouth at bedtime as needed.   Facility-Administered Encounter Medications as of 09/17/2021  Medication   cyanocobalamin ((VITAMIN B-12)) injection 1,000 mcg   Recent Relevant Labs: Lab Results  Component Value Date/Time   HGBA1C 4.8 06/01/2021 01:07 PM   HGBA1C 5.6 10/13/2020 02:18 PM    MICROALBUR 1.5 01/24/2020 03:28 PM   MICROALBUR 1.7 09/08/2017 12:04 PM    Kidney Function Lab Results  Component Value Date/Time   CREATININE 0.88 06/01/2021 01:07 PM   CREATININE 0.64 01/18/2021 12:19 PM   GFR 71.88 02/03/2019 09:37 AM   GFRNONAA 89 01/18/2021 12:19 PM   GFRAA 103 01/18/2021 12:19 PM    Current antihyperglycemic regimen:  Metformin 500 mg two tablets BID Actos 30 mg - patient is unsure if she is taking this medication or not, she believes she has not been taking. Glipizide ER 10 mg last filled 05/09/2020 90 DS  What recent interventions/DTPs have been made to improve glycemic control:  No recent interventions or DTPs.  Have there been any recent hospitalizations or ED visits since last visit with CPP? No  Patient denies hypoglycemic symptoms.  Patient denies hyperglycemic symptoms.  How often are you checking your blood sugar? Patient states she hasn't checked her blood sugars in a few weeks. Patient states she is going to start checking her sugars once a day and will call to report these readings in a few weeks or sooner.  Patient reports today's reading at 114  Are you checking your feet daily/regularly? Yes, patient checks her feet regularly.  Adherence Review: Is the patient currently on a STATIN medication? Yes Is the patient currently on ACE/ARB medication? Yes Does the patient have >5 day gap between last estimated  fill dates? No  Patient has some concerns of a potential UTI. She states she doesn't drive and husband recently had his leg amputated so she is unable to come to the office. She complains of lower abdominal pain. She also admits to decreased water intake and states she drinks a lot of coke. She wants to know if Dr. [name] will treat her for her UTI symptoms.  Care Gaps: Medicare Annual Wellness: Completed 07/12/2021 Ophthalmology Exam: [name] - never done Foot Exam: Next due on 05/11/2022 Hemoglobin A1C: 4.8% on  06/01/2021 Colonoscopy: Next due on 04/09/2028 Dexa Scan: Overdue - never done Mammogram: Ordered on 01/03/2021  Future Appointments  Date Time Provider [place]  12/13/2021  9:00 AM [facility] PHARMACIST [facility] None  07/18/2022 11:15 AM [facility]-NURSE HEALTH ADVISOR [facility] None    Star Rating Drugs: Atorvastatin 20 mg last filled 08/22/2021 90 DS Losartan 100 mg last filled 06/19/2021 90 DS Metformin ER 500 mg last filled 06/19/2021 90 DS  April D Calhoun, [place] Pharmacist Assistant [phone]   11 minutes spent in review, coordination, and documentation.  Reviewed by: Beverly Milch, PharmD Clinical Pharmacist [phone]

## 2021-09-22 ENCOUNTER — Other Ambulatory Visit: Payer: Self-pay | Admitting: Family Medicine

## 2021-11-13 ENCOUNTER — Ambulatory Visit (INDEPENDENT_AMBULATORY_CARE_PROVIDER_SITE_OTHER): Payer: Medicare HMO | Admitting: Podiatry

## 2021-11-13 ENCOUNTER — Encounter: Payer: Self-pay | Admitting: Podiatry

## 2021-11-13 DIAGNOSIS — E119 Type 2 diabetes mellitus without complications: Secondary | ICD-10-CM | POA: Diagnosis not present

## 2021-11-13 DIAGNOSIS — B351 Tinea unguium: Secondary | ICD-10-CM | POA: Diagnosis not present

## 2021-11-13 DIAGNOSIS — D696 Thrombocytopenia, unspecified: Secondary | ICD-10-CM

## 2021-11-13 DIAGNOSIS — M79674 Pain in right toe(s): Secondary | ICD-10-CM

## 2021-11-13 DIAGNOSIS — M79675 Pain in left toe(s): Secondary | ICD-10-CM

## 2021-11-13 NOTE — Progress Notes (Signed)
This patient returns to my office for at risk foot care.  This patient requires this care by a professional since this patient will be at risk due to having thrombocytopenia and diabetes.  This patient is unable to cut nails herself since the patient cannot reach her nails.These nails are painful walking and wearing shoes.  This patient presents for at risk foot care today. ? ?General Appearance  Alert, conversant and in no acute stress. ? ?Vascular  Dorsalis pedis and posterior tibial  pulses are palpable  bilaterally.  Capillary return is within normal limits  bilaterally. Temperature is within normal limits  bilaterally. ? ?Neurologic  Senn-Weinstein monofilament wire test within normal limits  bilaterally. Muscle power within normal limits bilaterally. ? ?Nails Thick disfigured discolored nails with subungual debris  from hallux to fifth toes bilaterally. No evidence of bacterial infection or drainage bilaterally. ? ?Orthopedic  No limitations of motion  feet .  No crepitus or effusions noted.  No bony pathology or digital deformities noted.  HAV  B/L ? ?Skin  normotropic skin with no porokeratosis noted bilaterally.  No signs of infections or ulcers noted.    ? ?Onychomycosis  Pain in right toes  Pain in left toes ? ?Consent was obtained for treatment procedures.   Mechanical debridement of nails 1-5  bilaterally performed with a nail nipper.  Filed with dremel without incident.  ? ? ?Return office visit  3 months                    Told patient to return for periodic foot care and evaluation due to potential at risk complications. ? ? ?[name] DPM  ?

## 2021-11-22 ENCOUNTER — Other Ambulatory Visit: Payer: Self-pay | Admitting: Family Medicine

## 2021-11-27 ENCOUNTER — Encounter: Payer: Self-pay | Admitting: Family Medicine

## 2021-11-27 ENCOUNTER — Ambulatory Visit (INDEPENDENT_AMBULATORY_CARE_PROVIDER_SITE_OTHER): Payer: Medicare HMO | Admitting: Family Medicine

## 2021-11-27 VITALS — BP 182/82 | HR 64 | Temp 98.2°F | Ht 65.0 in | Wt 183.0 lb

## 2021-11-27 DIAGNOSIS — R1030 Lower abdominal pain, unspecified: Secondary | ICD-10-CM | POA: Diagnosis not present

## 2021-11-27 DIAGNOSIS — R634 Abnormal weight loss: Secondary | ICD-10-CM | POA: Diagnosis not present

## 2021-11-27 DIAGNOSIS — R319 Hematuria, unspecified: Secondary | ICD-10-CM

## 2021-11-27 DIAGNOSIS — R6881 Early satiety: Secondary | ICD-10-CM | POA: Diagnosis not present

## 2021-11-27 DIAGNOSIS — R911 Solitary pulmonary nodule: Secondary | ICD-10-CM

## 2021-11-27 DIAGNOSIS — K7460 Unspecified cirrhosis of liver: Secondary | ICD-10-CM | POA: Diagnosis not present

## 2021-11-27 DIAGNOSIS — F0390 Unspecified dementia without behavioral disturbance: Secondary | ICD-10-CM | POA: Diagnosis not present

## 2021-11-27 LAB — MICROSCOPIC MESSAGE

## 2021-11-27 LAB — URINALYSIS, ROUTINE W REFLEX MICROSCOPIC
Glucose, UA: NEGATIVE
Leukocytes,Ua: NEGATIVE
Nitrite: POSITIVE — AB
Specific Gravity, Urine: 1.025 (ref 1.001–1.035)
pH: 5.5 (ref 5.0–8.0)

## 2021-11-27 NOTE — Progress Notes (Signed)
? ?Subjective:  ? ? Patient ID: Kelly [name], female    DOB: 03-01-1951, 71 y.o.   MRN: [id number] ? ?Urinary Tract Infection  ?  ?Wt Readings from Last 3 Encounters:  ?11/27/21 183 lb (83 kg)  ?07/12/21 206 lb (93.4 kg)  ?06/01/21 208 lb 12.8 oz (94.7 kg)  ? ?I last saw the patient in October.  At that time she was 208 pounds.  Since then she has dropped 25 pounds unintentionally.  She reports lower abdominal discomfort.  This occurs on a daily basis.  It has been present for 4 months.  She is concerned that she has a urinary tract infection.  She denies any significant dysuria.  She does report some pressure and orange like discoloration to the urine.  Urinalysis shows +1 bilirubin, trace blood, positive nitrates, negative leukocyte esterase.  Quantity was not sufficient for microscopy.  However urinalysis certainly does not suggest something that would explain 4 months of abdominal pain nor the 25 pound weight loss.  She reports early satiety and nausea.  She states that she becomes full after 3 or 4 bites.  She denies any melena or hematochezia but she does report occasional diarrhea.  She has 1-2 bowel movements a day.  They have changed in consistency and are now more mushy.  There is no visible blood.  Her blood pressure is extremely high today.  However she is very anxious and worried regarding the weight loss.  She has not been checking her blood pressure at home. ? ?Past Medical History:  ?Diagnosis Date  ? Anxiety   ? Chronic upper GI bleeding   ? intestinal AVM's, portal gastropathy, esophageal varices on egd 04/2018  ? Coronary artery disease involving native coronary artery without angina pectoris   ? Dementia ([place]) 09/16/2016  ? Depression   ? DIVERTICULITIS OF COLON 06/14/2008  ? Qualifier: Diagnosis of  By: [name]    ? DM (diabetes mellitus), type 2 ([place]) 09/15/2016  ? Esophageal varices determined by endoscopy ([place])   ? Hyperlipidemia   ? Hypertension   ? Hypothyroidism   ? Incidental  pulmonary nodule, greater than or equal to 38m 10/14/2018  ? LLL nodule needs f/u CT or PET-CT scan  ? Irritable bowel syndrome 04/06/2008  ? Centricity Description: IBS Qualifier: Diagnosis of  By: [name] (ADeborra Medina, [name]   Centricity Description: IRRITABLE BOWEL SYNDROME Qualifier: Diagnosis of  By: [name], [name]  ? Liver cirrhosis secondary to NASH ([place] 09/16/2016  ? NASH (nonalcoholic steatohepatitis)   ? Obesity   ? Portal hypertensive gastropathy ([facility])   ? 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 a. 04/20/19: s/p TAVR wtih a 233mMedtronic CoreValve Evolut Pro via the TF approach  ? Severe aortic stenosis   ? Splenomegaly 09/16/2016  ? Thrombocytopenia ([place]  ? Thyroid disease   ? Tobacco abuse   ? Vitamin D deficiency   ? ?Past Surgical History:  ?Procedure Laterality Date  ? CARDIAC CATHETERIZATION    ? COLONOSCOPY WITH PROPOFOL N/A 04/09/2018  ? Procedure: COLONOSCOPY WITH PROPOFOL;  Surgeon: JaMilus BanisterMD;  Location: [facility];  Service: Endoscopy;  Laterality: N/A;  ? CORONARY ANGIOPLASTY    ? CORONARY STENT INTERVENTION N/A 02/11/2019  ? Procedure: CORONARY STENT INTERVENTION;  Surgeon: McBurnell BlanksMD;  [facility];  Service: Cardiovascular;  Laterality: N/A;  Prox RCA ?Prox LAD  ? ESOPHAGOGASTRODUODENOSCOPY (EGD) WITH PROPOFOL N/A 04/09/2018  ? Procedure:  ESOPHAGOGASTRODUODENOSCOPY (EGD) WITH PROPOFOL;  Surgeon: [name], MD;  Location: [facility];  Service: Endoscopy;  Laterality: N/A;  ? oopherectomy    ? OVARIAN CYST REMOVAL    ? POLYPECTOMY  04/09/2018  ? Procedure: POLYPECTOMY;  Surgeon: [name], MD;  Location: [facility];  Service: Endoscopy;;  ? RIGHT/LEFT HEART CATH AND CORONARY ANGIOGRAPHY N/A 10/07/2018  ? Procedure: RIGHT/LEFT HEART CATH AND CORONARY ANGIOGRAPHY;  Surgeon: [name], MD;  Location: [facility];  Service: Cardiovascular;  Laterality: N/A;  ? TEE WITHOUT CARDIOVERSION N/A 04/20/2019   ? Procedure: TRANSESOPHAGEAL ECHOCARDIOGRAM (TEE);  Surgeon: [name], MD;  Location: [facility];  Service: Open Heart Surgery;  Laterality: N/A;  ? TRANSCATHETER AORTIC VALVE REPLACEMENT, TRANSFEMORAL N/A 04/20/2019  ? Procedure: TRANSCATHETER AORTIC VALVE REPLACEMENT, TRANSFEMORAL approach using 7m Evolut PRO+ medtronic valve;  Surgeon: MBurnell Blanks MD;  [facility]  Service: Open Heart Surgery;  Laterality: N/A;  ? ?Current Outpatient Medications on File Prior to Visit  ?Medication Sig Dispense Refill  ? ACCU-CHEK AVIVA PLUS test strip     ? ACCU-CHEK SOFTCLIX LANCETS lancets     ? amLODipine (NORVASC) 10 MG tablet TAKE 1 TABLET BY MOUTH EVERY DAY 90 tablet 3  ? aspirin EC 81 MG tablet Take 1 tablet (81 mg total) by mouth daily. 90 tablet 3  ? atorvastatin (LIPITOR) 20 MG tablet TAKE 1 TABLET EVERY DAY 90 tablet 3  ? Blood Glucose Monitoring Suppl (ACCU-CHEK AVIVA PLUS) w/Device KIT     ? cephALEXin (KEFLEX) 500 MG capsule Take 1 capsule (500 mg total) by mouth 3 (three) times daily. 21 capsule 0  ? FEROSUL 325 (65 Fe) MG tablet TAKE 1 TABLET EVERY DAY 90 tablet 3  ? furosemide (LASIX) 20 MG tablet TAKE 1 TABLET EVERY DAY 90 tablet 1  ? glipiZIDE (GLUCOTROL XL) 10 MG 24 hr tablet Take 1 tablet (10 mg total) by mouth daily with breakfast. 90 tablet 0  ? levothyroxine (SYNTHROID) 88 MCG tablet Take 1 tablet (88 mcg total) by mouth daily. 90 tablet 3  ? losartan (COZAAR) 100 MG tablet TAKE 1 TABLET EVERY DAY 90 tablet 1  ? metFORMIN (GLUCOPHAGE-XR) 500 MG 24 hr tablet TAKE 2 TABLETS TWICE DAILY 360 tablet 1  ? pantoprazole (PROTONIX) 40 MG tablet TAKE 1 TABLET EVERY DAY 90 tablet 3  ? pioglitazone (ACTOS) 30 MG tablet TAKE 1 TABLET EVERY DAY 90 tablet 3  ? potassium chloride (KLOR-CON) 10 MEQ tablet TAKE 2 TABLETS (20 MEQ TOTAL) BY MOUTH DAILY. 180 tablet 3  ? traZODone (DESYREL) 50 MG tablet TAKE 1 TABLET BY MOUTH EVERY DAY AT BEDTIME AS NEEDED 90 tablet 2  ? ?Current  Facility-Administered Medications on File Prior to Visit  ?Medication Dose Route Frequency Provider Last Rate Last Admin  ? cyanocobalamin ((VITAMIN B-12)) injection 1,000 mcg  1,000 mcg Subcutaneous Q30 days PSusy Frizzle MD   1,000 mcg at 09/08/17 1408  ? ? ?Allergies  ?Allergen Reactions  ? Prednisone Itching  ?  Heart rate increased  ? Celecoxib Rash  ? Macrodantin [Nitrofurantoin Macrocrystal] Rash  ? ?Social History  ? ?Socioeconomic History  ? Marital status: Married  ?  Spouse name: Not on file  ? Number of children: 1  ? Years of education: Not on file  ? Highest education level: Not on file  ?Occupational History  ? Not on file  ?Tobacco Use  ? Smoking status: Every Day  ?  Packs/day:  0.50  ?  Types: Cigarettes  ? Smokeless tobacco: Never  ?Vaping Use  ? Vaping Use: Never used  ?Substance and Sexual Activity  ? Alcohol use: No  ? Drug use: Not Currently  ?  Types: Benzodiazepines  ? Sexual activity: Not Currently  ?Other Topics Concern  ? Not on file  ?Social History Narrative  ? Married x 53 years in 2023.  ? 1 son  ? 2 grandsons  ? ?Social Determinants of Health  ? ?Financial Resource Strain: Low Risk   ? Difficulty of Paying Living Expenses: Not hard at all  ?Food Insecurity: No Food Insecurity  ? Worried About Charity fundraiser in the Last Year: Never true  ? Ran Out of Food in the Last Year: Never true  ?Transportation Needs: No Transportation Needs  ? Lack of Transportation (Medical): No  ? Lack of Transportation (Non-Medical): No  ?Physical Activity: Inactive  ? Days of Exercise per Week: 0 days  ? Minutes of Exercise per Session: 0 min  ?Stress: Stress Concern Present  ? Feeling of Stress : Rather much  ?Social Connections: Moderately Integrated  ? Frequency of Communication with Friends and Family: Three times a week  ? Frequency of Social Gatherings with Friends and Family: Once a week  ? Attends Religious Services: 1 to 4 times per year  ? Active Member of Clubs or Organizations: No  ?  Attends Archivist Meetings: Never  ? Marital Status: Married  ?Intimate Partner Violence: Not At Risk  ? Fear of Current or Ex-Partner: No  ? Emotionally Abused: No  ? Physically Abused: No  ? Sexually Abused: No  ?

## 2021-11-28 LAB — COMPLETE METABOLIC PANEL WITH GFR
AG Ratio: 1.5 (calc) (ref 1.0–2.5)
ALT: 9 U/L (ref 6–29)
AST: 21 U/L (ref 10–35)
Albumin: 3.8 g/dL (ref 3.6–5.1)
Alkaline phosphatase (APISO): 76 U/L (ref 37–153)
BUN: 9 mg/dL (ref 7–25)
CO2: 27 mmol/L (ref 20–32)
Calcium: 9.5 mg/dL (ref 8.6–10.4)
Chloride: 107 mmol/L (ref 98–110)
Creat: 0.75 mg/dL (ref 0.60–1.00)
Globulin: 2.6 g/dL (calc) (ref 1.9–3.7)
Glucose, Bld: 126 mg/dL — ABNORMAL HIGH (ref 65–99)
Potassium: 3.8 mmol/L (ref 3.5–5.3)
Sodium: 142 mmol/L (ref 135–146)
Total Bilirubin: 1.2 mg/dL (ref 0.2–1.2)
Total Protein: 6.4 g/dL (ref 6.1–8.1)
eGFR: 84 mL/min/{1.73_m2} (ref >=60)

## 2021-11-28 LAB — CBC WITH DIFFERENTIAL/PLATELET
Absolute Monocytes: 378 cells/uL (ref 200–950)
Basophils Absolute: 29 cells/uL (ref 0–200)
Basophils Relative: 0.7 %
Eosinophils Absolute: 130 cells/uL (ref 15–500)
Eosinophils Relative: 3.1 %
HCT: 34.9 % — ABNORMAL LOW (ref 35.0–45.0)
Hemoglobin: 12.1 g/dL (ref 11.7–15.5)
Lymphs Abs: 1079 cells/uL (ref 850–3900)
MCH: 30.3 pg (ref 27.0–33.0)
MCHC: 34.7 g/dL (ref 32.0–36.0)
MCV: 87.3 fL (ref 80.0–100.0)
MPV: 12.1 fL (ref 7.5–12.5)
Monocytes Relative: 9 %
Neutro Abs: 2583 cells/uL (ref 1500–7800)
Neutrophils Relative %: 61.5 %
Platelets: 57 10*3/uL — ABNORMAL LOW (ref 140–400)
RBC: 4 10*6/uL (ref 3.80–5.10)
RDW: 14.7 % (ref 11.0–15.0)
Total Lymphocyte: 25.7 %
WBC: 4.2 10*3/uL (ref 3.8–10.8)

## 2021-11-28 LAB — HEMOGLOBIN A1C
Hgb A1c MFr Bld: 4.6 % of total Hgb (ref <5.7)
Mean Plasma Glucose: 85 mg/dL
eAG (mmol/L): 4.7 mmol/L

## 2021-11-28 LAB — TSH: TSH: 1.77 mIU/L (ref 0.40–4.50)

## 2021-11-29 ENCOUNTER — Other Ambulatory Visit: Payer: Self-pay | Admitting: Family Medicine

## 2021-11-29 LAB — CULTURE, URINE COMPREHENSIVE
MICRO NUMBER:: 13308928
SPECIMEN QUALITY:: ADEQUATE

## 2021-11-29 MED ORDER — SULFAMETHOXAZOLE-TRIMETHOPRIM 800-160 MG PO TABS: 1.0000 | ORAL_TABLET | Freq: Two times a day (BID) | ORAL | 0 refills | Status: DC

## 2021-12-05 ENCOUNTER — Other Ambulatory Visit: Payer: Self-pay | Admitting: Family Medicine

## 2021-12-05 NOTE — Telephone Encounter (Signed)
Requested Prescriptions  ?Pending Prescriptions Disp Refills  ?? losartan (COZAAR) 100 MG tablet [Pharmacy Med Name: LOSARTAN POTASSIUM 100 MG Tablet] 90 tablet 1  ?  Sig: TAKE 1 TABLET EVERY DAY  ?  ? Cardiovascular:  Angiotensin Receptor Blockers Failed - 12/05/2021 10:49 AM  ?  ?  Failed - Last BP in normal range  ?  BP Readings from Last 1 Encounters:  ?11/27/21 (!) 182/82  ?   ?  ?  Passed - Cr in normal range and within 180 days  ?  Creat  ?Date Value Ref Range Status  ?11/27/2021 0.75 0.60 - 1.00 mg/dL Final  ?   ?  ?  Passed - K in normal range and within 180 days  ?  Potassium  ?Date Value Ref Range Status  ?11/27/2021 3.8 3.5 - 5.3 mmol/L Final  ?   ?  ?  Passed - Patient is not pregnant  ?  ?  Passed - Valid encounter within last 6 months  ?  Recent Outpatient Visits   ?      ? 1 week ago Hematuria, unspecified type  ? [facility], [name], MD  ? 6 months ago Type 2 diabetes mellitus with other specified complication, without long-term current use of insulin ([place])  ? [facility] Pickard, [name], MD  ? 9 months ago Situational anxiety  ? [facility] Pickard, [name], MD  ? 10 months ago Lower abdominal pain  ? [facility] Pickard, [name], MD  ? 1 year ago Type 2 diabetes mellitus with other specified complication, without long-term current use of insulin ([place])  ? [facility] Pickard, [name], MD  ?  ?  ? ?  ?  ?  ? ?

## 2021-12-07 ENCOUNTER — Telehealth: Payer: Self-pay

## 2021-12-07 ENCOUNTER — Other Ambulatory Visit: Payer: Self-pay | Admitting: Family Medicine

## 2021-12-07 MED ORDER — TRAMADOL HCL 50 MG PO TABS: 50.0000 mg | ORAL_TABLET | Freq: Three times a day (TID) | ORAL | 0 refills | Status: DC | PRN

## 2021-12-07 NOTE — Telephone Encounter (Signed)
Pt called and stated that she Still having pain in the belly/stomach and upper back of her should blade. She thinks that the pain is getting worst.Pt stated that she is so weak/shake Plus in the morning her urine has a lot of blood compare to in the evening.  ? ?Pt would like to know if you would prescribe her something while she is waiting for her ct scan of her upper torso /stomach on the 12/20/21. ? ?Pls advice ?

## 2021-12-07 NOTE — Telephone Encounter (Signed)
FYI: Called spoke with pt, per pt stated that she have some tramadol left from previous refill, however, per pt it does not help with her belly/kidney pains and back pain.  ? ?Per pt don't want the tramadol, will discontinue new Rx.  ?

## 2021-12-10 NOTE — Progress Notes (Signed)
?Chronic Care Management ?Pharmacy Note ? ?08/10/2021 ?Name:  Kelly [name] MRN:  [id number] DOB:  Sep 13, 1950 ? ?Summary: ?PharmD follow up.  Awaiting CT of abdomen to see what Kelly [name] be causing weight loss.  Doing well since stopping metformin.  Reports she had not been taking the glipizide for a while.  Reports epigastric pain when she needed to defecate that was "worse than child birth." ? ?Recommendations/Changes made from today's visit: ?Consider high intensity statin based on ASCVD risk ? ?Plan: ?FU 4 months ? ? ? ?Subjective: ?Kelly [name] is an 71 y.o. year old female who is a primary patient of Pickard, [name], MD.  The CCM team was consulted for assistance with disease management and care coordination needs.   ? ?Engaged with patient by telephone for FU visit in response to provider referral for pharmacy case management and/or care coordination services.  ? ?Consent to Services:  ?The patient was given the following information about Chronic Care Management services today, agreed to services, and gave verbal consent: 1. CCM service includes personalized support from designated clinical staff supervised by the primary care provider, including individualized plan of care and coordination with other care providers 2. 24/7 contact phone numbers for assistance for urgent and routine care needs. 3. Service will only be billed when office clinical staff spend 20 minutes or more in a month to coordinate care. 4. Only one practitioner [place] furnish and bill the service in a calendar month. 5.The patient Kelly [name] stop CCM services at any time (effective at the end of the month) by phone call to the office staff. 6. The patient will be responsible for cost sharing (co-pay) of up to 20% of the service fee (after annual deductible is met). Patient agreed to services and consent obtained. ? ?Patient Care Team: ?[name], MD as PCP - General (Family Medicine) ?[name], MD as PCP - Cardiology  (Cardiology) ?[name], [facility] as Pharmacist (Pharmacist) ? ?Recent office visits:  ?06/01/2021 [facility] (PCP) [name], MD; "I have recommended that we stop Ambien and Klonopin and avoid any centrally acting medication due to recent events.  I was honest with the patient about my concerns and explained to her in detail why I was worried about using Klonopin and Ambien.  Therefore I will no longer refill these medications. If her lab work is stable, if she insist on managing her anxiety, I will try sertraline 50 mg p.o. nightly as I feel that this is much safer for her and less apt to cause falls and confusion." ?  ?03/09/2021 [facility] (PCP) [name], MD;  "I recommended stopping the Ambien at night as the patient discussed.  We will start Klonopin 0.5 mg tablets.  She can take 1/2 to 1 tablet every 12 hours as needed for anxiety.  I will give her 30 tablets and I have encouraged her to use these sparingly.  I would be extremely happy if these 30 tablets lasts her 8 months again.  Have also cautioned her to be careful and not to combine the medication with hydrocodone which she takes perhaps once a day for pain in her back." ?  ?Recent consult visits:  ?05/11/2021 [facility] (Podiatry) [name], DPM; no medication changes indicated. ?  ?Hospital visits:  ?None in previous 6 months ? ? ? ?Objective: ? ?Lab Results  ?Component Value Date  ? CREATININE 0.75 11/27/2021  ? BUN 9 11/27/2021  ? GFR 71.88 02/03/2019  ? EGFR 84 11/27/2021  ?  GFRNONAA 89 01/18/2021  ? GFRAA 103 01/18/2021  ? NA 142 11/27/2021  ? K 3.8 11/27/2021  ? CALCIUM 9.5 11/27/2021  ? CO2 27 11/27/2021  ? GLUCOSE 126 (H) 11/27/2021  ? ? ?Lab Results  ?Component Value Date/Time  ? HGBA1C 4.6 11/27/2021 11:51 AM  ? HGBA1C 4.8 06/01/2021 01:07 PM  ? GFR 71.88 02/03/2019 09:37 AM  ? GFR 80.02 07/13/2018 10:16 AM  ? MICROALBUR 1.5 01/24/2020 03:28 PM  ? MICROALBUR 1.7 09/08/2017 12:04 PM  ?  ?Last diabetic Eye exam: No results found for:  HMDIABEYEEXA  ?Last diabetic Foot exam: No results found for: HMDIABFOOTEX  ? ?Lab Results  ?Component Value Date  ? CHOL 160 06/01/2021  ? HDL 57 06/01/2021  ? [place] 83 06/01/2021  ? TRIG 106 06/01/2021  ? CHOLHDL 2.8 06/01/2021  ? ? ? ?  Latest Ref Rng & Units 11/27/2021  ? 11:51 AM 06/01/2021  ?  1:07 PM 01/18/2021  ? 12:19 PM  ?Hepatic Function  ?Total Protein 6.1 - 8.1 g/dL 6.4   6.6   6.7    ?AST 10 - 35 U/L _0 ?ALT 6 - 29 U/L _1 ?Total Bilirubin 0.2 - 1.2 mg/dL 1.2   1.2   1.0    ? ? ?Lab Results  ?Component Value Date/Time  ? TSH 1.77 11/27/2021 11:51 AM  ? TSH 5.59 (H) 06/01/2021 01:07 PM  ? ? ? ?  Latest Ref Rng & Units 11/27/2021  ? 11:51 AM 06/01/2021  ?  1:07 PM 01/18/2021  ? 12:19 PM  ?CBC  ?WBC 3.8 - 10.8 Thousand/uL 4.2   3.8   6.7    ?Hemoglobin 11.7 - 15.5 g/dL 12.1   11.8   12.0    ?Hematocrit 35.0 - 45.0 % 34.9   34.9   35.5    ?Platelets 140 - 400 Thousand/uL 57   58   76    ? ? ?No results found for: VD25OH ? ?Clinical ASCVD: Yes  ?The 10-year ASCVD risk score (Arnett DK, et al., 2019) is: 66.6% ?  Values used to calculate the score: ?    Age: 30 years ?    Sex: Female ?    Is Non-Hispanic African American: No ?    Diabetic: Yes ?    Tobacco smoker: Yes ?    Systolic Blood Pressure: 321 mmHg ?    Is BP treated: Yes ?    HDL Cholesterol: 57 mg/dL ?    Total Cholesterol: 160 mg/dL   ? ? ?  07/12/2021  ? 11:13 AM 09/13/2020  ? 11:54 AM 07/27/2020  ? 12:24 PM  ?Depression screen PHQ 2/9  ?Decreased Interest 1 0 3  ?Down, Depressed, Hopeless 1 0 3  ?PHQ - 2 Score 2 0 6  ?Altered sleeping 1  3  ?Tired, decreased energy 1  3  ?Change in appetite 0  2  ?Feeling bad or failure about yourself  0  3  ?Trouble concentrating 0  2  ?Moving slowly or fidgety/restless 0  2  ?Suicidal thoughts 0  0  ?PHQ-9 Score 4  21  ?Difficult doing work/chores Somewhat difficult  Extremely dIfficult  ?  ? ? ?Social History  ? ?Tobacco Use  ?Smoking Status Every Day  ? Packs/day: 0.50  ? Types: Cigarettes   ?Smokeless Tobacco Never  ? ?BP Readings from Last 3 Encounters:  ?11/27/21 Marland Kitchen)  182/82  ?06/01/21 (!) 162/80  ?01/18/21 128/68  ? ?Pulse Readings from Last 3 Encounters:  ?11/27/21 64  ?06/01/21 66  ?01/18/21 82  ? ?Wt Readings from Last 3 Encounters:  ?11/27/21 183 lb (83 kg)  ?07/12/21 206 lb (93.4 kg)  ?06/01/21 208 lb 12.8 oz (94.7 kg)  ? ?BMI Readings from Last 3 Encounters:  ?11/27/21 30.45 kg/m?  ?07/12/21 34.28 kg/m?  ?06/01/21 35.29 kg/m?  ? ?Assessment/Interventions: Review of patient past medical history, allergies, medications, health status, including review of consultants reports, laboratory and other test data, was performed as part of comprehensive evaluation and provision of chronic care management services.  ? ?SDOH:  (Social Determinants of Health) assessments and interventions performed: Yes ? ?Financial Resource Strain: Low Risk   ? Difficulty of Paying Living Expenses: Not hard at all  ? ? ?SDOH Screenings  ? ?Alcohol Screen: Low Risk   ? Last Alcohol Screening Score (AUDIT): 0  ?Depression (PHQ2-9): Low Risk   ? PHQ-2 Score: 4  ?Financial Resource Strain: Low Risk   ? Difficulty of Paying Living Expenses: Not hard at all  ?Food Insecurity: No Food Insecurity  ? Worried About Charity fundraiser in the Last Year: Never true  ? Ran Out of Food in the Last Year: Never true  ?Housing: Low Risk   ? Last Housing Risk Score: 0  ?Physical Activity: Inactive  ? Days of Exercise per Week: 0 days  ? Minutes of Exercise per Session: 0 min  ?Social Connections: Moderately Integrated  ? Frequency of Communication with Friends and Family: Three times a week  ? Frequency of Social Gatherings with Friends and Family: Once a week  ? Attends Religious Services: 1 to 4 times per year  ? Active Member of Clubs or Organizations: No  ? Attends Archivist Meetings: Never  ? Marital Status: Married  ?Stress: Stress Concern Present  ? Feeling of Stress : Rather much  ?Tobacco Use: High Risk  ? Smoking  Tobacco Use: Every Day  ? Smokeless Tobacco Use: Never  ? Passive Exposure: Not on file  ?Transportation Needs: No Transportation Needs  ? Lack of Transportation (Medical): No  ? Lack of Transportation (Non-Medical): No  ? ? ?CCM

## 2021-12-12 DIAGNOSIS — Z0100 Encounter for examination of eyes and vision without abnormal findings: Secondary | ICD-10-CM | POA: Diagnosis not present

## 2021-12-12 DIAGNOSIS — H5200 Hypermetropia, unspecified eye: Secondary | ICD-10-CM | POA: Diagnosis not present

## 2021-12-13 ENCOUNTER — Ambulatory Visit: Payer: Medicare HMO | Admitting: Pharmacist

## 2021-12-13 DIAGNOSIS — E785 Hyperlipidemia, unspecified: Secondary | ICD-10-CM

## 2021-12-13 DIAGNOSIS — I10 Essential (primary) hypertension: Secondary | ICD-10-CM

## 2021-12-13 NOTE — Patient Instructions (Addendum)
Visit Information ? ? Goals Addressed   ? ?  ?  ?  ?  ? This Visit's Progress  ?  Monitor and Manage My Blood Sugar-Diabetes Type 2   On track  ?  Timeframe:  Long-Range Goal ?Priority:  High ?Start Date:   08/24/21                          ?Expected End Date: 02/24/22                     ? ?Follow Up Date 11/25/21  ?  ?- check blood sugar at prescribed times ?- check blood sugar if I feel it is too high or too low ?- enter blood sugar readings and medication or insulin into daily log  ?  ?Why is this important?   ?Checking your blood sugar at home helps to keep it from getting very high or very low.  ?Writing the results in a diary or log helps the doctor know how to care for you.  ?Your blood sugar log should have the time, date and the results.  ?Also, write down the amount of insulin or other medicine that you take.  ?Other information, like what you ate, exercise done and how you were feeling, will also be helpful.   ?  ?Notes:  ?  ? ?  ? ?Patient Care Plan: General Pharmacy (Adult)  ?  ? ?Problem Identified: HTN, CAD, DM, Depression, Hypothyroidism   ?Priority: High  ?Onset Date: 08/24/2021  ?  ? ?Long-Range Goal: Patient-Specific Goal   ?Start Date: 08/24/2021  ?Expected End Date: 02/21/2022  ?Recent Progress: On track  ?Priority: High  ?Note:   ?Current Barriers:  ?Unable to achieve control of LDL ?Possible hypoglycemia  ? ?Pharmacist Clinical Goal(s):  ?Patient will achieve improvement in LDL as evidenced by labs through collaboration with PharmD and provider.  ? ?Interventions: ?1:1 collaboration with [name], MD regarding development and update of comprehensive plan of care as evidenced by provider attestation and co-signature ?Inter-disciplinary care team collaboration (see longitudinal plan of care) ?Comprehensive medication review performed; medication list updated in electronic medical record ? ?Hypertension (BP goal <130/80) ?-Controlled ?-Current treatment: ?Losartan 161m daily Appropriate,  Effective, Safe, Accessible ?Amlodipine 162mdaily Appropriate, Effective, Safe, Accessible ?-Medications previously tried: metoprolol, nadolol, propranolol  ?-Current home readings: no logs provided today ?-Current dietary habits: usually only eating breakfast and lunch, does not have much of an appetite lately ?-Current exercise habits: "plenty" takes care of her husband, nothing routine ?-Reports hypotensive/hypertensive symptoms - occasional dizziness ?-Educated on Exercise goal of 150 minutes per week; ?Importance of home blood pressure monitoring; ?Symptoms of hypotension and importance of maintaining adequate hydration; ?-Counseled to monitor BP at home a few times per week, document, and provide log at future appointments ?-Recommended to continue current medication ?Counseled on going slow when switching from sitting to standing positions ? ?Update 12/13/21 ?Reports BP has improved.  No need to make changes today until we see CT of abdomen and pelvis. ?Continue to monitor BP at home and contact usKoreaf consistently elevated above goal. ?No changes at this time. ? ?Hyperlipidemia: (LDL goal < 70) ?-Not ideally controlled ?-Current treatment: ?Atorvastatin 2058maily Query Appropriate, ?-Medications previously tried: none noted  ?-Current dietary patterns: see HTN ?-Current exercise habits: see HTN ?-Educated on Cholesterol goals;  ?Benefits of statin for ASCVD risk reduction; ?Importance of limiting foods high in cholesterol; ?-Recommended to continue  current medication ?Assessed current ASCVD risk, which is > 50%.  Patient most recent LDL above goal of < 70. ?Could consider increasing to high intensity statin (Atorvastatin 48m daily) for better chance of meeting goal < 70 to further reduce CV risk. ?She denies any adverse effects at this time. ? ?Diabetes (A1c goal <7%) ?-Controlled ?-Current medications: ?None ?-Medications previously tried: glipizide, glimerpiride  ?-Current home glucose readings ?fasting  glucose: unknown ?post prandial glucose: unknown ?-Reports hypoglycemic/hyperglycemic symptoms - shakiness later in the day ?-Current meal patterns: inconsistent meals, skipping lunch often due to low appetite ?-Current exercise: caring for husband ?-Educated on A1c and blood sugar goals; ?Prevention and management of hypoglycemic episodes; ?Benefits of routine self-monitoring of blood sugar; ?-Counseled to check feet daily and get yearly eye exams ?-Recommended to continue current medication ?Patient to let me know if she is taking Actos or not.  It is not in fill history so I do not believe she is.  If so would recommend to d/c due to lower A1c.  Also could consider decreasing her metformin due to most recent A1c and her recent low appetite and hypoglycemic symptoms. ?Will f/u on Actos - have asked her to check sugar a few times per day. ?If having lower sugars then would consider decrease of metformin. ? ?Update 12/13/21 ?She has stopped all of her medications for blood sugar. ?She is not checking at home but denies symptoms of hyper or hypoglycemia. ?Due to decreasing A1c and decreased appetite, I believe it is best that she remains off for now.  She mentions episode of severe epigastric pain when she needed to defecate. ?She has CT of the abdomen scheduled for next week. ?At this time no changes necessary. ? ?Depression/Anxiety (Goal: Minimize symptoms) ?-Controlled ?-Current treatment: ?None ?-Medications previously tried/failed: sertraline, clonazepam, trazodone ?-PHQ9:  ?PHQ9 SCORE ONLY 07/12/2021 09/13/2020 07/27/2020  ?PHQ-9 Total Score 4 0 21  ?-GAD7:  ?GAD 7 : Generalized Anxiety Score 07/27/2020  ?Nervous, Anxious, on Edge 3  ?Control/stop worrying 2  ?Worry too much - different things 3  ?Trouble relaxing 3  ?Restless 3  ?Easily annoyed or irritable 2  ?Afraid - awful might happen 3  ?Total GAD 7 Score 19  ?Anxiety Difficulty Extremely difficult  ?-reports stable mood, sleep has been ok since husband is  out of hospital.  Tried trazodone but is worried about not waking up if husband needs her. ?-Recommended monitor symptoms contact providers with new or worsening depression. ? ?Hypothyroidism (Goal: maintain TSH) ?-Controlled ?-Current treatment  ?Levothyroxine 867m daily Appropriate, Query effective, ?-Medications previously tried: none noted ?-Recently increased dose to 8858mdaily, taking appropriately. ?-Has not had TSH checked since increasing dose.  ?-Recommended to continue current medication ?Recommend recheck TSH for efficacy of current dose. ? ?Patient Goals/Self-Care Activities ?Patient will:  ?- focus on medication adherence by pill box ?check glucose a few times per week, document, and provide at future appointments ?check blood pressure weekly, document, and provide at future appointments ? ?Follow Up Plan: The care management team will reach out to the patient again over the next 120 days.  ?  ? ?  ?  ? ?The patient verbalized understanding of instructions, educational materials, and care plan provided today and declined offer to receive copy of patient instructions, educational materials, and care plan.  ?Telephone follow up appointment with pharmacy team member scheduled for: 4 months ? ?ChrEdythe ClarityPHLittle River HealthcareChrBeverly MilchharmD, CPP ?Clinical Pharmacist Practitioner ?[id number]?

## 2021-12-20 ENCOUNTER — Ambulatory Visit
Admission: RE | Admit: 2021-12-20 | Discharge: 2021-12-20 | Disposition: A | Discharge status: Home / Self Care | Payer: Medicare HMO | Source: Ambulatory Visit | Attending: Family Medicine | Admitting: Family Medicine

## 2021-12-20 DIAGNOSIS — K7469 Other cirrhosis of liver: Secondary | ICD-10-CM | POA: Diagnosis not present

## 2021-12-20 DIAGNOSIS — R319 Hematuria, unspecified: Secondary | ICD-10-CM

## 2021-12-20 DIAGNOSIS — R634 Abnormal weight loss: Secondary | ICD-10-CM

## 2021-12-20 DIAGNOSIS — K766 Portal hypertension: Secondary | ICD-10-CM | POA: Diagnosis not present

## 2021-12-20 DIAGNOSIS — K5730 Diverticulosis of large intestine without perforation or abscess without bleeding: Secondary | ICD-10-CM | POA: Diagnosis not present

## 2021-12-20 DIAGNOSIS — R1030 Lower abdominal pain, unspecified: Secondary | ICD-10-CM

## 2021-12-20 DIAGNOSIS — R6881 Early satiety: Secondary | ICD-10-CM

## 2021-12-20 MED ORDER — IOPAMIDOL (ISOVUE-300) INJECTION 61%
100.0000 mL | Freq: Once | INTRAVENOUS | Status: AC | PRN
  Administered 2021-12-20: 100 mL via INTRAVENOUS

## 2021-12-24 ENCOUNTER — Other Ambulatory Visit: Payer: Self-pay | Admitting: Family Medicine

## 2021-12-24 ENCOUNTER — Telehealth: Payer: Self-pay

## 2021-12-24 DIAGNOSIS — R634 Abnormal weight loss: Secondary | ICD-10-CM

## 2021-12-24 DIAGNOSIS — K7460 Unspecified cirrhosis of liver: Secondary | ICD-10-CM

## 2021-12-24 NOTE — Telephone Encounter (Signed)
  Fyi:   Pt called today. Pt has seen her ct results via her MyChart, however, would like to know what it means. Advice pt that we will call her as soon as we get the results.   Please advice.

## 2021-12-25 NOTE — Telephone Encounter (Signed)
Spoke with pt advice pt per Dr. Harolyn Rutherford, pt voiced understanding. Per referal, it shows that pt is has a schedule appt on 6/21 with Dr. [name].

## 2022-01-03 ENCOUNTER — Telehealth: Payer: Self-pay

## 2022-01-03 NOTE — Telephone Encounter (Signed)
Call pt have any appt on 6/13

## 2022-01-03 NOTE — Telephone Encounter (Signed)
Per pt the antibiotics from previous OV, helped with kidney/bladder. However, it didn't clear it out the symptoms. Would like something stronger?  Pls advice?

## 2022-01-04 ENCOUNTER — Other Ambulatory Visit: Payer: Self-pay | Admitting: Family Medicine

## 2022-01-04 NOTE — Telephone Encounter (Signed)
Requested medication (s) are due for refill today: Yes  Requested medication (s) are on the active medication list: Yes  Last refill:  12/06/20  Future visit scheduled: Yes  Notes to clinic:  Prescription expired. Protocol indicates pt. Needs lab work.    Requested Prescriptions  Pending Prescriptions Disp Refills   FEROSUL 325 (65 Fe) MG tablet [Pharmacy Med Name: FEROSUL 325 (65 Fe) MG Tablet] 90 tablet 3    Sig: TAKE 1 TABLET EVERY DAY     Endocrinology:  Minerals - Iron Supplementation Failed - 01/04/2022  1:07 PM      Failed - HCT in normal range and within 360 days    HCT  Date Value Ref Range Status  11/27/2021 34.9 (L) 35.0 - 45.0 % Final   Hematocrit  Date Value Ref Range Status  04/14/2019 27.4 (L) 34.0 - 46.6 % Final         Failed - Fe (serum) in normal range and within 360 days    Iron  Date Value Ref Range Status  02/23/2018 45 45 - 160 mcg/dL Final   %SAT  Date Value Ref Range Status  02/23/2018 12 (L) 16 - 45 % (calc) Final         Failed - Ferritin in normal range and within 360 days    Ferritin  Date Value Ref Range Status  02/23/2018 7 (L) 16 - 288 ng/mL Final         Passed - HGB in normal range and within 360 days    Hemoglobin  Date Value Ref Range Status  11/27/2021 12.1 11.7 - 15.5 g/dL Final  04/14/2019 9.4 (L) 11.1 - 15.9 g/dL Final         Passed - RBC in normal range and within 360 days    RBC  Date Value Ref Range Status  11/27/2021 4.00 3.80 - 5.10 Million/uL Final         Passed - Valid encounter within last 12 months    Recent Outpatient Visits           1 month ago Hematuria, unspecified type   [place] [name], MD   7 months ago Type 2 diabetes mellitus with other specified complication, without long-term current use of insulin ([place])   [place] Pickard, [name], MD   10 months ago Situational anxiety   [place], [name], MD   11 months ago  Lower abdominal pain   [place] [name], MD   1 year ago Type 2 diabetes mellitus with other specified complication, without long-term current use of insulin ([place])   [place] Pickard, [name], MD       Future Appointments             In 1 week Pickard, [name], MD [place]

## 2022-01-15 ENCOUNTER — Ambulatory Visit (INDEPENDENT_AMBULATORY_CARE_PROVIDER_SITE_OTHER): Payer: Medicare HMO | Admitting: Family Medicine

## 2022-01-15 ENCOUNTER — Encounter: Payer: Self-pay | Admitting: Family Medicine

## 2022-01-15 VITALS — BP 164/82 | HR 76 | Temp 95.9°F | Ht 65.0 in | Wt 170.0 lb

## 2022-01-15 DIAGNOSIS — R634 Abnormal weight loss: Secondary | ICD-10-CM | POA: Diagnosis not present

## 2022-01-15 DIAGNOSIS — K7460 Unspecified cirrhosis of liver: Secondary | ICD-10-CM | POA: Diagnosis not present

## 2022-01-15 DIAGNOSIS — R300 Dysuria: Secondary | ICD-10-CM | POA: Diagnosis not present

## 2022-01-15 DIAGNOSIS — R319 Hematuria, unspecified: Secondary | ICD-10-CM

## 2022-01-15 LAB — URINALYSIS, ROUTINE W REFLEX MICROSCOPIC
Glucose, UA: NEGATIVE
Nitrite: POSITIVE — AB
Specific Gravity, Urine: 1.025 (ref 1.001–1.035)
pH: 5.5 (ref 5.0–8.0)

## 2022-01-15 LAB — MICROSCOPIC MESSAGE

## 2022-01-15 MED ORDER — CIPROFLOXACIN HCL 500 MG PO TABS: 500.0000 mg | ORAL_TABLET | Freq: Two times a day (BID) | ORAL | 0 refills | Status: AC

## 2022-01-15 NOTE — Progress Notes (Signed)
Subjective:    Patient ID: Kelly [name], female    DOB: 06-22-1951, 71 y.o.   MRN: [id number]  Urinary Tract Infection    4/23 I last saw the patient in October.  At that time she was 208 pounds.  Since then she has dropped 25 pounds unintentionally.  She reports lower abdominal discomfort.  This occurs on a daily basis.  It has been present for 4 months.  She is concerned that she has a urinary tract infection.  She denies any significant dysuria.  She does report some pressure and orange like discoloration to the urine.  Urinalysis shows +1 bilirubin, trace blood, positive nitrates, negative leukocyte esterase.  Quantity was not sufficient for microscopy.  However urinalysis certainly does not suggest something that would explain 4 months of abdominal pain nor the 25 pound weight loss.  She reports early satiety and nausea.  She states that she becomes full after 3 or 4 bites.  She denies any melena or hematochezia but she does report occasional diarrhea.  She has 1-2 bowel movements a day.  They have changed in consistency and are now more mushy.  There is no visible blood.  Her blood pressure is extremely high today.  However she is very anxious and worried regarding the weight loss.  She has not been checking her blood pressure at home.  At that time, my plan was: Patient has a history of a 12 mm lung nodule seen on CT scan in December 2021.  However this was stable and recommended recheck in 1 year.  While she is due for this, I do not believe this explains the weight loss.  The early satiety and weight loss could be due to depression.  She has been under tremendous stress.  Her husband has been diagnosed with cancer and has declining health.  However this would not explain the lower abdominal pain.  Therefore I feel that we need to pursue an aggressive work-up to determine.  I recommended a CT scan of the abdomen and pelvis due to the 25 pound weight loss, constant lower abdominal pain, the  microscopic hematuria, etc.  I will also check a CBC, CMP, A1c to rule out uncontrolled diabetes, and TSH to rule out excessive levothyroxine.  Due to the rapid weight loss I recommended discontinuation of glipizide and metformin to prevent hypoglycemia.  Asked the patient to check her blood pressure every day when she gets home and call me with the values.  I believe her blood pressure Frisk be falsely elevated today due to extreme anxiety because she is extremely nervous that I am going to "give her bad news".  If the blood pressure is elevated at home, we will certainly add additional medication.  Consider getting a CT scan of the lung to reevaluate the lung nodule if the CT scan of the abdomen and pelvis and lab work is normal  01/15/22 Labs showed a1c of 4.6!Marland Kitchen  Therefore, metformin and glipizide were supposed to be stopped.  CT scan revealed:  Lower chest: Visualized lung bases are clear. At least moderate right coronary artery calcification. Global cardiac size within normal limits. Gastroesophageal varices identified. Hepatobiliary: The liver contour is nodular and the liver is small in overall size in keeping with changes of cirrhosis. Recanalization of the umbilical vein and gastroesophageal varices are identified in keeping with portal venous hypertension. Portal vein, splenic vein, and superior mesenteric vein are patent. Inferior mesenteric vein is enlarged and numerous rectal varices are identified  within the presacral space. Superimposed mild hepatic steatosis. No focal intrahepatic masses are identified. No intra or extrahepatic biliary ductal dilation. Gallbladder unremarkable. Pancreas: Unremarkable Spleen: Moderate splenomegaly with the spleen measuring 19.5 cm in greatest dimension. No intrasplenic lesions are seen. Adrenals/Urinary Tract: Adrenal glands are unremarkable. Kidneys are normal, without renal calculi, focal lesion, or hydronephrosis. Bladder is  unremarkable. Stomach/Bowel: Moderate sigmoid diverticulosis. Scattered diverticular seen throughout the remainder of the colon. Stomach, small bowel, and large bowel are otherwise unremarkable. Appendix normal. No free intraperitoneal gas or fluid. Vascular/Lymphatic: Moderate aortoiliac atherosclerotic calcification. No aortic aneurysm. No pathologic adenopathy within the abdomen and pelvis. Reproductive: Uterus and bilateral adnexa are unremarkable. Other: No abdominal wall hernia. Musculoskeletal: No acute bone abnormality. Bilateral L5 pars defects are present with grade 1 anterolisthesis of L5 upon S1, slightly progressive since prior examination. No acute bone abnormality. No lytic or blastic bone lesion.  Given cirrhosis, early satiety, and weight loss, I recommended GI consult but she does not see Dr. [name] until July.  She continues to report urinary symptoms despite being treated with bactrim for uti in April.   Wt Readings from Last 3 Encounters:  01/15/22 170 lb (77.1 kg)  11/27/21 183 lb (83 kg)  07/12/21 206 lb (93.4 kg)   Has lost an additional 13 pounds.  She reports early satiety, no appetite, the smell of food makes her sick.  She just does not want to eat.  She is not sleeping.  She is extremely tearful today.  She also has dementia.  It is mild however I am concerned that it Kunsman have progressed slightly.  Her husband is battling cancer.  This is around the time that the weight loss began in earnest.  He states that she is not sleeping.  Does endorse anxiety.  She also reports depression.  She continues to report dysuria and hematuria and frequency.  Urinalysis today shows +2 blood, positive nitrates, positive leukocyte esterase.  She has not had a confirmatory urine culture however.  Past Medical History:  Diagnosis Date   Anxiety    Chronic upper GI bleeding    intestinal AVM's, portal gastropathy, esophageal varices on egd 04/2018   Coronary artery disease involving  native coronary artery without angina pectoris    Dementia ([place]) 09/16/2016   Depression    DIVERTICULITIS OF COLON 06/14/2008   Qualifier: Diagnosis of  By: Gretta Cool RN, Malachy Mood     DM (diabetes mellitus), type 2 ([place]) 09/15/2016   Esophageal varices determined by endoscopy ([place])    Hyperlipidemia    Hypertension    Hypothyroidism    Incidental pulmonary nodule, greater than or equal to 109m 10/14/2018   LLL nodule needs f/u CT or PET-CT scan   Irritable bowel syndrome 04/06/2008   Centricity Description: IBS Qualifier: Diagnosis of  By: [name] (ADeborra Medina, [name]   Centricity Description: IRRITABLE BOWEL SYNDROME Qualifier: Diagnosis of  By: [name], [name]   Liver cirrhosis secondary to NASH ([place] 09/16/2016   NASH (nonalcoholic steatohepatitis)    Obesity    Portal hypertensive gastropathy ([facility]    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 04/20/19: s/p TAVR wtih a 276mMedtronic CoreValve Evolut Pro via the TF approach   Severe aortic stenosis    Splenomegaly 09/16/2016   Thrombocytopenia ([facility])    Thyroid disease    Tobacco abuse    Vitamin D deficiency    Past Surgical History:  Procedure Laterality Date   CARDIAC CATHETERIZATION  COLONOSCOPY WITH PROPOFOL N/A 04/09/2018   Procedure: COLONOSCOPY WITH PROPOFOL;  Surgeon: [name], MD;  Location: [facility];  Service: Endoscopy;  Laterality: N/A;   CORONARY ANGIOPLASTY     CORONARY STENT INTERVENTION N/A 02/11/2019   Procedure: CORONARY STENT INTERVENTION;  Surgeon: [name], MD;  Location: [facility];  Service: Cardiovascular;  Laterality: N/A;  Prox RCA Prox LAD   ESOPHAGOGASTRODUODENOSCOPY (EGD) WITH PROPOFOL N/A 04/09/2018   Procedure: ESOPHAGOGASTRODUODENOSCOPY (EGD) WITH PROPOFOL;  Surgeon: [name], MD;  Location: [facility];  Service: Endoscopy;  Laterality: N/A;   oopherectomy     OVARIAN CYST REMOVAL     POLYPECTOMY  04/09/2018   Procedure: POLYPECTOMY;  Surgeon:  [name], MD;  Location: [facility];  Service: Endoscopy;;   RIGHT/LEFT HEART CATH AND CORONARY ANGIOGRAPHY N/A 10/07/2018   Procedure: RIGHT/LEFT HEART CATH AND CORONARY ANGIOGRAPHY;  Surgeon: [name], MD;  Location: [facility];  Service: Cardiovascular;  Laterality: N/A;   TEE WITHOUT CARDIOVERSION N/A 04/20/2019   Procedure: TRANSESOPHAGEAL ECHOCARDIOGRAM (TEE);  Surgeon: [name], MD;  Location: [facility];  Service: Open Heart Surgery;  Laterality: N/A;   TRANSCATHETER AORTIC VALVE REPLACEMENT, TRANSFEMORAL N/A 04/20/2019   Procedure: TRANSCATHETER AORTIC VALVE REPLACEMENT, TRANSFEMORAL approach using 40m Evolut PRO+ medtronic valve;  Surgeon: MBurnell Blanks MD;  Location: [facility]  Service: Open Heart Surgery;  Laterality: N/A;   Current Outpatient Medications on File Prior to Visit  Medication Sig Dispense Refill   ACCU-CHEK AVIVA PLUS test strip      ACCU-CHEK SOFTCLIX LANCETS lancets      amLODipine (NORVASC) 10 MG tablet TAKE 1 TABLET BY MOUTH EVERY DAY 90 tablet 3   aspirin EC 81 MG tablet Take 1 tablet (81 mg total) by mouth daily. 90 tablet 3   atorvastatin (LIPITOR) 20 MG tablet TAKE 1 TABLET EVERY DAY 90 tablet 3   Blood Glucose Monitoring Suppl (ACCU-CHEK AVIVA PLUS) w/Device KIT      cephALEXin (KEFLEX) 500 MG capsule Take 1 capsule (500 mg total) by mouth 3 (three) times daily. 21 capsule 0   ferrous sulfate (FEROSUL) 325 (65 FE) MG tablet TAKE 1 TABLET EVERY DAY 30 tablet 0   furosemide (LASIX) 20 MG tablet TAKE 1 TABLET EVERY DAY 90 tablet 1   glipiZIDE (GLUCOTROL XL) 10 MG 24 hr tablet Take 1 tablet (10 mg total) by mouth daily with breakfast. 90 tablet 0   levothyroxine (SYNTHROID) 88 MCG tablet Take 1 tablet (88 mcg total) by mouth daily. 90 tablet 3   losartan (COZAAR) 100 MG tablet TAKE 1 TABLET EVERY DAY 90 tablet 1   metFORMIN (GLUCOPHAGE-XR) 500 MG 24 hr tablet TAKE 2 TABLETS TWICE DAILY 360 tablet 1   pantoprazole  (PROTONIX) 40 MG tablet TAKE 1 TABLET EVERY DAY 90 tablet 3   pioglitazone (ACTOS) 30 MG tablet TAKE 1 TABLET EVERY DAY 90 tablet 3   potassium chloride (KLOR-CON) 10 MEQ tablet TAKE 2 TABLETS (20 MEQ TOTAL) BY MOUTH DAILY. 180 tablet 3   sulfamethoxazole-trimethoprim (BACTRIM DS) 800-160 MG tablet Take 1 tablet by mouth 2 (two) times daily. 20 tablet 0   traZODone (DESYREL) 50 MG tablet TAKE 1 TABLET BY MOUTH EVERY DAY AT BEDTIME AS NEEDED 90 tablet 2   Current Facility-Administered Medications on File Prior to Visit  Medication Dose Route Frequency Provider Last Rate Last Admin   cyanocobalamin ((VITAMIN B-12)) injection 1,000 mcg  1,000 mcg Subcutaneous Q30 days  [name], MD   1,000 mcg at 09/08/17 1408    Allergies  Allergen Reactions   Prednisone Itching    Heart rate increased   Celecoxib Rash   Macrodantin [Nitrofurantoin Macrocrystal] Rash   Social History   Socioeconomic History   Marital status: Married    Spouse name: Not on file   Number of children: 1   Years of education: Not on file   Highest education level: Not on file  Occupational History   Not on file  Tobacco Use   Smoking status: Every Day    Packs/day: 0.50    Types: Cigarettes   Smokeless tobacco: Never  Vaping Use   Vaping Use: Never used  Substance and Sexual Activity   Alcohol use: No   Drug use: Not Currently    Types: Benzodiazepines   Sexual activity: Not Currently  Other Topics Concern   Not on file  Social History Narrative   Married x 57 years in 2023.   1 son   2 grandsons   Social Determinants of Health   Financial Resource Strain: Low Risk  (07/12/2021)   Overall Financial Resource Strain (CARDIA)    Difficulty of Paying Living Expenses: Not hard at all  Food Insecurity: No Food Insecurity (07/12/2021)   Hunger Vital Sign    Worried About Running Out of Food in the Last Year: Never true    [place] in the Last Year: Never true  Transportation Needs: No  Transportation Needs (07/12/2021)   PRAPARE - Hydrologist (Medical): No    Lack of Transportation (Non-Medical): No  Physical Activity: Inactive (07/12/2021)   Exercise Vital Sign    Days of Exercise per Week: 0 days    Minutes of Exercise per Session: 0 min  Stress: Stress Concern Present (07/12/2021)   [place]    Feeling of Stress : Rather much  Social Connections: Moderately Integrated (07/12/2021)   Social Connection and Isolation Panel [NHANES]    Frequency of Communication with Friends and Family: Three times a week    Frequency of Social Gatherings with Friends and Family: Once a week    Attends Religious Services: 1 to 4 times per year    Active Member of [employer] or Organizations: No    Attends Archivist Meetings: Never    Marital Status: Married  Human resources officer Violence: Not At Risk (07/12/2021)   Humiliation, Afraid, Rape, and Kick questionnaire    Fear of Current or Ex-Partner: No    Emotionally Abused: No    Physically Abused: No    Sexually Abused: No       Review of Systems     Objective:   Physical Exam Constitutional:      Appearance: Normal appearance. She is obese.  Cardiovascular:     Rate and Rhythm: Normal rate and regular rhythm.     Heart sounds: Murmur heard.  Pulmonary:     Effort: Pulmonary effort is normal. No respiratory distress.     Breath sounds: Normal breath sounds. No wheezing, rhonchi or rales.  Abdominal:     General: Bowel sounds are normal.     Palpations: Abdomen is soft. There is no mass.     Tenderness: There is no guarding or rebound.  Neurological:     Mental Status: She is alert.          Assessment & Plan:  Dysuria - Plan:  Urinalysis, Routine w reflex microscopic, Urine Culture  Weight loss - Plan: DG Chest 2 View  Hematuria, unspecified type  Cirrhosis of liver without ascites, unspecified hepatic cirrhosis  type [facility]) My nurse spoke to Dr. [name] office and they were graciously able to work the patient in tomorrow at 49.  I am very appreciative of this.  Obviously with 40 pounds of weight loss in 6 months I am concerned about malignancy.  Thus far in the malignancy work-up, she has had a relatively benign CT scan of the abdomen other than her underlying known cirrhosis.  I do believe the patient would benefit from an EGD given her early satiety and possibly a colonoscopy but I will defer to [facility] expert recommendation.  I have asked the patient to go immediately to get a chest x-ray.  However she denies cough or hemoptysis or fever or chills or night sweats so I doubt pulmonary malignancy.  She does have persistent hematuria.  However CT scan showed no mass inside the bladder.  I will check a urine culture.  Meanwhile I will treat the patient empirically with Cipro 500 mg twice daily.  If urine culture confirms infection sensitive to Cipro no further work-up.  If urine culture shows no infection I will consult urology for cystoscopy but I do not feel that this will be the cause of the weakness and weight loss.  My opinion is that the patient is likely suffering from worsening dementia and severe depression and anxiety which is causing early satiety and poor appetite.  I would recommend treating it as such however I want to rule out malignancy first.  Again I appreciate GI getting the patient in quickly.

## 2022-01-16 ENCOUNTER — Ambulatory Visit
Admission: RE | Admit: 2022-01-16 | Discharge: 2022-01-16 | Disposition: A | Discharge status: Home / Self Care | Payer: Medicare HMO | Source: Ambulatory Visit | Attending: Family Medicine | Admitting: Family Medicine

## 2022-01-16 ENCOUNTER — Ambulatory Visit (INDEPENDENT_AMBULATORY_CARE_PROVIDER_SITE_OTHER): Payer: Medicare HMO | Admitting: Gastroenterology

## 2022-01-16 ENCOUNTER — Encounter: Payer: Self-pay | Admitting: Gastroenterology

## 2022-01-16 ENCOUNTER — Other Ambulatory Visit (INDEPENDENT_AMBULATORY_CARE_PROVIDER_SITE_OTHER): Payer: Medicare HMO

## 2022-01-16 VITALS — BP 174/82 | HR 67 | Ht 65.0 in | Wt 170.2 lb

## 2022-01-16 DIAGNOSIS — R634 Abnormal weight loss: Secondary | ICD-10-CM

## 2022-01-16 DIAGNOSIS — K7460 Unspecified cirrhosis of liver: Secondary | ICD-10-CM

## 2022-01-16 DIAGNOSIS — K7581 Nonalcoholic steatohepatitis (NASH): Secondary | ICD-10-CM

## 2022-01-16 DIAGNOSIS — I8510 Secondary esophageal varices without bleeding: Secondary | ICD-10-CM | POA: Diagnosis not present

## 2022-01-16 DIAGNOSIS — I8500 Esophageal varices without bleeding: Secondary | ICD-10-CM

## 2022-01-16 DIAGNOSIS — R319 Hematuria, unspecified: Secondary | ICD-10-CM

## 2022-01-16 LAB — CBC WITH DIFFERENTIAL/PLATELET
Basophils Absolute: 0 10*3/uL (ref 0.0–0.1)
Basophils Relative: 0.7 % (ref 0.0–3.0)
Eosinophils Absolute: 0.1 10*3/uL (ref 0.0–0.7)
Eosinophils Relative: 1.8 % (ref 0.0–5.0)
HCT: 38.8 % (ref 36.0–46.0)
Hemoglobin: 13.5 g/dL (ref 12.0–15.0)
Lymphocytes Relative: 23.9 % (ref 12.0–46.0)
Lymphs Abs: 1.4 10*3/uL (ref 0.7–4.0)
MCHC: 34.8 g/dL (ref 30.0–36.0)
MCV: 85.3 fl (ref 78.0–100.0)
Monocytes Absolute: 0.5 10*3/uL (ref 0.1–1.0)
Monocytes Relative: 8.1 % (ref 3.0–12.0)
Neutro Abs: 3.7 10*3/uL (ref 1.4–7.7)
Neutrophils Relative %: 65.5 % (ref 43.0–77.0)
Platelets: 56 10*3/uL — ABNORMAL LOW (ref 150.0–400.0)
RBC: 4.55 Mil/uL (ref 3.87–5.11)
RDW: 15.5 % (ref 11.5–15.5)
WBC: 5.7 10*3/uL (ref 4.0–10.5)

## 2022-01-16 LAB — COMPREHENSIVE METABOLIC PANEL
ALT: 12 U/L (ref 0–35)
AST: 26 U/L (ref 0–37)
Albumin: 4.1 g/dL (ref 3.5–5.2)
Alkaline Phosphatase: 83 U/L (ref 39–117)
BUN: 6 mg/dL (ref 6–23)
CO2: 26 mEq/L (ref 19–32)
Calcium: 10 mg/dL (ref 8.4–10.5)
Chloride: 108 mEq/L (ref 96–112)
Creatinine, Ser: 0.74 mg/dL (ref 0.40–1.20)
GFR: 80.35 mL/min (ref >60.00)
Glucose, Bld: 93 mg/dL (ref 70–99)
Potassium: 4.1 mEq/L (ref 3.5–5.1)
Sodium: 142 mEq/L (ref 135–145)
Total Bilirubin: 2.3 mg/dL — ABNORMAL HIGH (ref 0.2–1.2)
Total Protein: 7 g/dL (ref 6.0–8.3)

## 2022-01-16 LAB — PROTIME-INR
INR: 1.3 ratio — ABNORMAL HIGH (ref 0.8–1.0)
Prothrombin Time: 13.8 s — ABNORMAL HIGH (ref 9.6–13.1)

## 2022-01-16 NOTE — Patient Instructions (Signed)
Your provider has requested that you go to the basement level for lab work before leaving today. Press "B" on the elevator. The lab is located at the first door on the left as you exit the elevator.  Due to recent changes in healthcare laws, you Kohl see the results of your imaging and laboratory studies on MyChart before your provider has had a chance to review them.  We understand that in some cases there Traore be results that are confusing or concerning to you. Not all laboratory results come back in the same time frame and the provider Sabatino be waiting for multiple results in order to interpret others.  Please give [place] 48 hours in order for your provider to thoroughly review all the results before contacting the office for clarification of your results.   We will be in touch about setting up a colonoscopy and EGD at the hospital with Dr Owens Loffler.   I appreciate the opportunity to care for you. Alonza Bogus, PA-C

## 2022-01-16 NOTE — Progress Notes (Signed)
01/16/2022 Kelly [name] [id number] 1950/09/09  Review of gastrointestinal problems:  1. small tubular adenoma in colon removed September 2009. Next colonoscopy September 2014.  Colonoscopy 04/2018 Dr. [name]: single small TA removed, recall in 5 years. 2. left-sided diverticulosis with diverticular associated edema noted by colonoscopy 2009  3. abnormal liver tests noted July, 2010: Likely from NASH Blood work August, 2010: Hepatitis A., B., C. were all negative; alpha one antitrypsin level normal, ceruloplasmin level normal, AMA negative, AMA negative, iron studies normal, anti-smooth muscle antibody negative, AST and ALT in 50-70 range; TTG negative, IgA level normal. Liver ultrasound suggested fatty liver disease. [place] 01/2018 "mild cirrhosis" without mass lesion ([place] 09/2016 "suspected cirrhosis, no focal hepatic lesion, + splenomegaly").  [place] 07/2018 cirrhosis without liver masses or ascites. MELD 7 (07/2018 labs) AFP normal (07/2018 labs) EGD 04/2018 medium to large esophageal varices, mod to severe portal gastropathy. Started propranolol. 4. Change in bowels, minor bleeding;  Colonoscopy [place] 2014: The colonic mucosa was slightly erythematous throughout. Biopsies were taken randomly and sent to pathology. There were multiple diverticulum in the left colon. The terminal ileum was normal. No polyps or cancers.  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 [name].  She has not been seen here since June 2020.  She is here today primarily for concerns of weight loss.  According to our records we can see that she is down 36 pounds since December, but apparently the weight loss has occurred much more recent than that.  She says that she has no appetite.  She says that she was having constipation so took some laxatives and then now has been having loose stools for the past couple of days.  Denies any rectal bleeding.  CBC and CMP from April 25 all  look good except for low platelet count of 57.  CT scan of the abdomen and pelvis with contrast on 5/18 showed the following:  IMPRESSION: Interval development of cirrhosis with evidence of portal venous hypertension with moderate splenomegaly and portosystemic collateralization via gastroesophageal and rectal varices. Superimposed mild hepatic steatosis.   Moderate right coronary artery calcification.   Moderate sigmoid diverticulosis.   Bilateral L5 pars defects with progressive, grade 1 anterolisthesis L5-S1.   Aortic Atherosclerosis (ICD10-I70.0).     GI procedures:  EGD 04/2018 medium to large esophageal varices, mod to severe portal gastropathy. Started propranolol, which she is no longer on.  Colonoscopy 04/2018 Dr. [name]: single small TA removed, recall in 5 years.   Past Medical History:  Diagnosis Date   Anxiety    Chronic upper GI bleeding    intestinal AVM's, portal gastropathy, esophageal varices on egd 04/2018   Coronary artery disease involving native coronary artery without angina pectoris    Dementia ([place]) 09/16/2016   Depression    DIVERTICULITIS OF COLON 06/14/2008   Qualifier: Diagnosis of  By: Gretta Cool RN, Malachy Mood     DM (diabetes mellitus), type 2 ([place]) 09/15/2016   Esophageal varices determined by endoscopy ([place])    Hyperlipidemia    Hypertension    Hypothyroidism    Incidental pulmonary nodule, greater than or equal to 61m 10/14/2018   LLL nodule needs f/u CT or PET-CT scan   Irritable bowel syndrome 04/06/2008   Centricity Description: IBS Qualifier: Diagnosis of  By: [name] (ADeborra Medina, [name]   Centricity Description: IRRITABLE BOWEL SYNDROME Qualifier: Diagnosis of  By: [name], [name]   Liver cirrhosis  secondary to NASH ([place]) 09/16/2016   NASH (nonalcoholic steatohepatitis)    Obesity    Portal hypertensive gastropathy (HCC)    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 04/20/19: s/p TAVR wtih a 52m Medtronic CoreValve Evolut  Pro via the TF approach   Severe aortic stenosis    Splenomegaly 09/16/2016   Thrombocytopenia ([place]    Thyroid disease    Tobacco abuse    Vitamin D deficiency    Past Surgical History:  Procedure Laterality Date   CARDIAC CATHETERIZATION     COLONOSCOPY WITH PROPOFOL N/A 04/09/2018   Procedure: COLONOSCOPY WITH PROPOFOL;  Surgeon: JMilus Banister MD;  [facility]: [facility];  Service: Endoscopy;  Laterality: N/A;   CORONARY ANGIOPLASTY     CORONARY STENT INTERVENTION N/A 02/11/2019   Procedure: CORONARY STENT INTERVENTION;  Surgeon: MBurnell Blanks MD;  [facility];  Service: Cardiovascular;  Laterality: N/A;  Prox RCA Prox LAD   ESOPHAGOGASTRODUODENOSCOPY (EGD) WITH PROPOFOL N/A 04/09/2018   Procedure: ESOPHAGOGASTRODUODENOSCOPY (EGD) WITH PROPOFOL;  Surgeon: JMilus Banister MD;  [facility];  Service: Endoscopy;  Laterality: N/A;   oopherectomy     OVARIAN CYST REMOVAL     POLYPECTOMY  04/09/2018   Procedure: POLYPECTOMY;  Surgeon: JMilus Banister MD;  [facility]: [facility];  Service: Endoscopy;;   RIGHT/LEFT HEART CATH AND CORONARY ANGIOGRAPHY N/A 10/07/2018   Procedure: RIGHT/LEFT HEART CATH AND CORONARY ANGIOGRAPHY;  Surgeon: MBurnell Blanks MD;  [facility];  Service: Cardiovascular;  Laterality: N/A;   TEE WITHOUT CARDIOVERSION N/A 04/20/2019   Procedure: TRANSESOPHAGEAL ECHOCARDIOGRAM (TEE);  Surgeon: MBurnell Blanks MD;  [facility]  Service: Open Heart Surgery;  Laterality: N/A;   TRANSCATHETER AORTIC VALVE REPLACEMENT, TRANSFEMORAL N/A 04/20/2019   Procedure: TRANSCATHETER AORTIC VALVE REPLACEMENT, TRANSFEMORAL approach using 295mEvolut PRO+ medtronic valve;  Surgeon: McBurnell BlanksMD;  [facility] Service: Open Heart Surgery;  Laterality: N/A;    reports that she has been smoking cigarettes. She has been smoking an average of .5 packs per day. She has never used smokeless tobacco. She  reports that she does not currently use drugs after having used the following drugs: Benzodiazepines. She reports that she does not drink alcohol. family history includes Alcohol abuse in her father; Alzheimer's disease in her mother; Dementia in her mother; Heart disease in her father; Lung cancer in her father; Other in her mother. Allergies  Allergen Reactions   Prednisone Itching    Heart rate increased   Celecoxib Rash   Macrodantin [Nitrofurantoin Macrocrystal] Rash      Outpatient Encounter Medications as of 01/16/2022  Medication Sig   ACCU-CHEK AVIVA PLUS test strip    ACCU-CHEK SOFTCLIX LANCETS lancets    amLODipine (NORVASC) 10 MG tablet TAKE 1 TABLET BY MOUTH EVERY DAY   atorvastatin (LIPITOR) 20 MG tablet TAKE 1 TABLET EVERY DAY   Blood Glucose Monitoring Suppl (ACCU-CHEK AVIVA PLUS) w/Device KIT    ciprofloxacin (CIPRO) 500 MG tablet Take 1 tablet (500 mg total) by mouth 2 (two) times daily for 7 days.   ferrous sulfate (FEROSUL) 325 (65 FE) MG tablet TAKE 1 TABLET EVERY DAY   furosemide (LASIX) 20 MG tablet TAKE 1 TABLET EVERY DAY   glipiZIDE (GLUCOTROL XL) 10 MG 24 hr tablet Take 1 tablet (10 mg total) by mouth daily with breakfast.   levothyroxine (SYNTHROID) 88 MCG tablet Take 1 tablet (88 mcg total) by mouth  daily.   losartan (COZAAR) 100 MG tablet TAKE 1 TABLET EVERY DAY   pantoprazole (PROTONIX) 40 MG tablet TAKE 1 TABLET EVERY DAY   pioglitazone (ACTOS) 30 MG tablet TAKE 1 TABLET EVERY DAY   potassium chloride (KLOR-CON) 10 MEQ tablet TAKE 2 TABLETS (20 MEQ TOTAL) BY MOUTH DAILY.   traZODone (DESYREL) 50 MG tablet TAKE 1 TABLET BY MOUTH EVERY DAY AT BEDTIME AS NEEDED   [DISCONTINUED] aspirin EC 81 MG tablet Take 1 tablet (81 mg total) by mouth daily. (Patient not taking: Reported on 01/16/2022)   [DISCONTINUED] metFORMIN (GLUCOPHAGE-XR) 500 MG 24 hr tablet TAKE 2 TABLETS TWICE DAILY (Patient not taking: Reported on 01/16/2022)   [DISCONTINUED]  sulfamethoxazole-trimethoprim (BACTRIM DS) 800-160 MG tablet Take 1 tablet by mouth 2 (two) times daily. (Patient not taking: Reported on 01/16/2022)   Facility-Administered Encounter Medications as of 01/16/2022  Medication   cyanocobalamin ((VITAMIN B-12)) injection 1,000 mcg     REVIEW OF SYSTEMS  : All other systems reviewed and negative except where noted in the History of Present Illness.   PHYSICAL EXAM: BP (!) 174/82   Pulse 67   Ht 5' 5"  (1.651 m)   Wt 170 lb 4 oz (77.2 kg)   BMI 28.33 kg/m  General: Well developed white female in no acute distress Head: Normocephalic and atraumatic Eyes:  Sclerae anicteric, conjunctiva pink. Ears: Normal auditory acuity Lungs: Clear throughout to auscultation; no W/R/R. Heart: Regular rate and rhythm; no M/R/G. Abdomen: Soft, non-distended.  BS present. Non-tender. Rectal:  Will be done at the time of colonoscopy. Musculoskeletal: Symmetrical with no gross deformities  Skin: No lesions on visible extremities Extremities: No edema  Neurological: Alert oriented x 4, grossly non-focal Psychological:  Alert and cooperative. Normal mood and affect  ASSESSMENT AND PLAN: *Loss of weight: According to our system we can see that she is down 36 pounds since December, but apparently the weight loss has occurred much more recent than that.  CT scan of the abdomen and pelvis did not show any sign of malignancy there.  She is going for a chest x-ray.  I wonder if this could just be due to progression of cirrhosis and loss of muscle mass with that. *Cirrhosis of the liver due to NASH: Last seen here in 2020.  Once again had recent CT scan that did not show any sign of HCC, but she does have looks like progression of cirrhosis with evidence of portal venous hypertension and moderate splenomegaly and portosystemic collateralization via gastroesophageal and rectal varices with superimposed mild hepatic steatosis.  CBC and CMP are normal except for platelet  count of 57K.  According to her labs from April her MELD would be very low.  We will get updated labs today.  -With her weight loss and her cirrhosis and history of varices as well as progression with what looks like also rectal varices seen on CT scan I think we will repeat with EGD and colonoscopy.  These will need to be done at [facility] as she has had varices in the past and [place] need banding.  We will schedule this with Dr. [name] once he reviews and gives availability on dates and times. -We will check a CBC, CMP, PT/INR, and AFP today.   CC:  [name], MD

## 2022-01-17 ENCOUNTER — Telehealth: Payer: Self-pay

## 2022-01-17 LAB — AFP TUMOR MARKER: AFP-Tumor Marker: 3.7 ng/mL

## 2022-01-17 NOTE — Telephone Encounter (Signed)
Pt called stating that during [place] on Tuesday, you have suggest some kind of powder form medication to help with diarrhea which she cab get over the counter? Pt for got the name of it?   Please advice

## 2022-01-18 ENCOUNTER — Other Ambulatory Visit: Payer: Self-pay | Admitting: Family Medicine

## 2022-01-18 LAB — URINE CULTURE
MICRO NUMBER:: 13518863
SPECIMEN QUALITY:: ADEQUATE

## 2022-01-18 MED ORDER — AMOXICILLIN-POT CLAVULANATE 875-125 MG PO TABS: 1.0000 | ORAL_TABLET | Freq: Two times a day (BID) | ORAL | 0 refills | Status: DC

## 2022-01-18 NOTE — Telephone Encounter (Signed)
Spoke with pt, per Dr [name] to try Imodium for diarrhea. Per pt expressed understanding. Nothing at this time.

## 2022-01-21 ENCOUNTER — Other Ambulatory Visit: Payer: Self-pay | Admitting: Family Medicine

## 2022-01-21 ENCOUNTER — Other Ambulatory Visit: Payer: Self-pay

## 2022-01-21 ENCOUNTER — Telehealth: Payer: Self-pay

## 2022-01-21 DIAGNOSIS — R634 Abnormal weight loss: Secondary | ICD-10-CM

## 2022-01-21 DIAGNOSIS — K7460 Unspecified cirrhosis of liver: Secondary | ICD-10-CM

## 2022-01-21 DIAGNOSIS — I8500 Esophageal varices without bleeding: Secondary | ICD-10-CM

## 2022-01-21 MED ORDER — PEG 3350-KCL-NA BICARB-NACL 420 G PO SOLR: 4000.0000 mL | Freq: Once | ORAL | 0 refills | Status: AC

## 2022-01-21 MED ORDER — FLUOXETINE HCL 40 MG PO CAPS: 40.0000 mg | ORAL_CAPSULE | Freq: Every day | ORAL | 3 refills | Status: DC

## 2022-01-21 NOTE — Progress Notes (Signed)
I agree with the above note, plan 

## 2022-01-21 NOTE — Telephone Encounter (Signed)
EGD Colon has been scheduled for 04/04/22 at [facility] at 1045 am with DJ   Left message on machine to call back

## 2022-01-21 NOTE — Telephone Encounter (Signed)
ENDO COLON scheduled, pt instructed and medications reviewed.  Patient instructions mailed to home.  Patient to call with any questions or concerns.

## 2022-01-21 NOTE — Telephone Encounter (Signed)
-----   Message from [name], MD sent at 01/21/2022  8:13 AM EDT ----- I agree, thanks everyone  DJ  ----- Message ----- From: Loralie Champagne, PA-C Sent: 01/16/2022   3:13 PM EDT To: [name], MD; Timothy Lasso, RN  Hello.  I will be sending this chart to you later this afternoon, but I would like to schedule her for EGD and colonoscopy at [place].  She has history of varices and has had 36 pound weight loss in just a few months.  CT scan shows progression of her cirrhosis but nothing else to account for weight loss.  Jash Wahlen, she also has an OV appt with Dr. [name] in July, which we can cancel once we get a date for her procedures.  I am getting updated labs today.  Thank you,  Jess

## 2022-01-25 ENCOUNTER — Telehealth: Payer: Self-pay | Admitting: Pharmacist

## 2022-02-07 ENCOUNTER — Other Ambulatory Visit: Payer: Self-pay

## 2022-02-07 ENCOUNTER — Other Ambulatory Visit: Payer: Self-pay | Admitting: Family Medicine

## 2022-02-07 MED ORDER — TRAZODONE HCL 50 MG PO TABS: ORAL_TABLET | ORAL | 2 refills | Status: DC

## 2022-02-07 NOTE — Telephone Encounter (Signed)
Requested medication (s) are due for refill today - no  Requested medication (s) are on the active medication list -no  Future visit scheduled -no  Last refill: unknown  Notes to clinic: Medication no longer on current medication list- non delegated Rx  Requested Prescriptions  Pending Prescriptions Disp Refills   clonazePAM (KLONOPIN) 0.5 MG tablet [Pharmacy Med Name: CLONAZEPAM 0.5 MG TABLET] 30 tablet     Sig: Take 1 tablet (0.5 mg total) by mouth 2 (two) times daily as needed for anxiety (use sparingly).     Not Delegated - Psychiatry: Anxiolytics/Hypnotics 2 Failed - 02/07/2022  2:52 PM      Failed - This refill cannot be delegated      Failed - Urine Drug Screen completed in last 360 days      Passed - Patient is not pregnant      Passed - Valid encounter within last 6 months    Recent Outpatient Visits           2 months ago Hematuria, unspecified type   [place] [name], MD   8 months ago Type 2 diabetes mellitus with other specified complication, without long-term current use of insulin ([place])   [place] Pickard, [name], MD   11 months ago Situational anxiety   [place] Pickard, [name], MD   1 year ago Lower abdominal pain   [place] [name], MD   1 year ago Type 2 diabetes mellitus with other specified complication, without long-term current use of insulin ([place])   [place] Pickard, [name], MD                 Requested Prescriptions  Pending Prescriptions Disp Refills   clonazePAM (KLONOPIN) 0.5 MG tablet [Pharmacy Med Name: CLONAZEPAM 0.5 MG TABLET] 30 tablet     Sig: Take 1 tablet (0.5 mg total) by mouth 2 (two) times daily as needed for anxiety (use sparingly).     Not Delegated - Psychiatry: Anxiolytics/Hypnotics 2 Failed - 02/07/2022  2:52 PM      Failed - This refill cannot be delegated      Failed - Urine Drug Screen completed  in last 360 days      Passed - Patient is not pregnant      Passed - Valid encounter within last 6 months    Recent Outpatient Visits           2 months ago Hematuria, unspecified type   [place] [name], MD   8 months ago Type 2 diabetes mellitus with other specified complication, without long-term current use of insulin ([place])   [place] Pickard, [name], MD   11 months ago Situational anxiety   [place] Pickard, [name], MD   1 year ago Lower abdominal pain   [place] [name], MD   1 year ago Type 2 diabetes mellitus with other specified complication, without long-term current use of insulin ([place])   [facility] Pickard, [name], MD

## 2022-02-12 ENCOUNTER — Telehealth: Payer: Self-pay

## 2022-02-12 NOTE — Telephone Encounter (Signed)
Pt called in with questions about her previous refills. Pt was unsure of the amount of pills she was supposed to get. Pt states that she only received 9 pills. Please advise.  Cb#:[phone]

## 2022-02-12 NOTE — Telephone Encounter (Signed)
Called and spoke with pt. Pt would like a Rx for tramadol for anxiety due to pt's husband's upcoming surgeries.  Please advice

## 2022-02-13 NOTE — Telephone Encounter (Signed)
Spoke w/pt, pt aware and voiced understanding.   Please send Rx for Prozac.

## 2022-02-20 ENCOUNTER — Ambulatory Visit: Payer: Medicare HMO | Admitting: Gastroenterology

## 2022-03-01 ENCOUNTER — Other Ambulatory Visit: Payer: Self-pay | Admitting: Family Medicine

## 2022-03-01 NOTE — Telephone Encounter (Signed)
Requested Prescriptions  Pending Prescriptions Disp Refills  . FEROSUL 325 (65 Fe) MG tablet [Pharmacy Med Name: FEROSUL 325 (65 Fe) MG Tablet] 90 tablet 1    Sig: TAKE 1 TABLET EVERY DAY     Endocrinology:  Minerals - Iron Supplementation Failed - 03/01/2022 10:49 AM      Failed - Fe (serum) in normal range and within 360 days    Iron  Date Value Ref Range Status  02/23/2018 45 45 - 160 mcg/dL Final   %SAT  Date Value Ref Range Status  02/23/2018 12 (L) 16 - 45 % (calc) Final         Failed - Ferritin in normal range and within 360 days    Ferritin  Date Value Ref Range Status  02/23/2018 7 (L) 16 - 288 ng/mL Final         Passed - HGB in normal range and within 360 days    Hemoglobin  Date Value Ref Range Status  01/16/2022 13.5 12.0 - 15.0 g/dL Final  04/14/2019 9.4 (L) 11.1 - 15.9 g/dL Final         Passed - HCT in normal range and within 360 days    HCT  Date Value Ref Range Status  01/16/2022 38.8 36.0 - 46.0 % Final   Hematocrit  Date Value Ref Range Status  04/14/2019 27.4 (L) 34.0 - 46.6 % Final         Passed - RBC in normal range and within 360 days    RBC  Date Value Ref Range Status  01/16/2022 4.55 3.87 - 5.11 Mil/uL Final         Passed - Valid encounter within last 12 months    Recent Outpatient Visits          3 months ago Hematuria, unspecified type   [place] [name], MD   9 months ago Type 2 diabetes mellitus with other specified complication, without long-term current use of insulin ([place])   [place] Pickard, [name], MD   11 months ago Situational anxiety   [place], [name], MD   1 year ago Lower abdominal pain   [place] [name], MD   1 year ago Type 2 diabetes mellitus with other specified complication, without long-term current use of insulin ([place])   [facility] Pickard, [name], MD

## 2022-03-04 NOTE — Telephone Encounter (Signed)
Requested medication (s) are due for refill today:   No  Discontinued 12/07/2021  Requested medication (s) are on the active medication list:   No  Future visit scheduled:   No   Last ordered: Discontinued 12/07/2021  Returned because not on med list due to being discontinued and it's non delegated.     Requested Prescriptions  Pending Prescriptions Disp Refills   traMADol (ULTRAM) 50 MG tablet [Pharmacy Med Name: TRAMADOL HCL 50 MG TABLET] 21 tablet 1    Sig: TAKE 1 TABLET BY MOUTH EVERY 8 HOURS AS NEEDED.     Not Delegated - Analgesics:  Opioid Agonists Failed - 03/01/2022  6:31 PM      Failed - This refill cannot be delegated      Failed - Urine Drug Screen completed in last 360 days      Failed - Valid encounter within last 3 months    Recent Outpatient Visits           3 months ago Hematuria, unspecified type   [place] [name], MD   9 months ago Type 2 diabetes mellitus with other specified complication, without long-term current use of insulin ([place])   [place] Pickard, [name], MD   12 months ago Situational anxiety   [place] Pickard, [name], MD   1 year ago Lower abdominal pain   [place] [name], MD   1 year ago Type 2 diabetes mellitus with other specified complication, without long-term current use of insulin ([place])   [facility] Pickard, [name], MD

## 2022-03-05 ENCOUNTER — Telehealth: Payer: Self-pay | Admitting: Pharmacist

## 2022-03-05 NOTE — Progress Notes (Signed)
Chronic Care Management Pharmacy Assistant   Name: Kelly [name]  MRN: [id number] DOB: 06-08-1951   Reason for Encounter: Hypertension Adherence Call    Recent office visits:  None  Recent consult visits:  None  Hospital visits:  None in previous 6 months  Medications: Outpatient Encounter Medications as of 03/05/2022  Medication Sig   ACCU-CHEK AVIVA PLUS test strip    ACCU-CHEK SOFTCLIX LANCETS lancets    amLODipine (NORVASC) 10 MG tablet TAKE 1 TABLET BY MOUTH EVERY DAY   amoxicillin-clavulanate (AUGMENTIN) 875-125 MG tablet Take 1 tablet by mouth 2 (two) times daily.   atorvastatin (LIPITOR) 20 MG tablet TAKE 1 TABLET EVERY DAY   Blood Glucose Monitoring Suppl (ACCU-CHEK AVIVA PLUS) w/Device KIT    FEROSUL 325 (65 Fe) MG tablet TAKE 1 TABLET EVERY DAY   FLUoxetine (PROZAC) 40 MG capsule Take 1 capsule (40 mg total) by mouth daily.   furosemide (LASIX) 20 MG tablet TAKE 1 TABLET EVERY DAY   glipiZIDE (GLUCOTROL XL) 10 MG 24 hr tablet Take 1 tablet (10 mg total) by mouth daily with breakfast.   levothyroxine (SYNTHROID) 88 MCG tablet Take 1 tablet (88 mcg total) by mouth daily.   losartan (COZAAR) 100 MG tablet TAKE 1 TABLET EVERY DAY   pantoprazole (PROTONIX) 40 MG tablet TAKE 1 TABLET EVERY DAY   pioglitazone (ACTOS) 30 MG tablet TAKE 1 TABLET EVERY DAY   potassium chloride (KLOR-CON) 10 MEQ tablet TAKE 2 TABLETS (20 MEQ TOTAL) BY MOUTH DAILY.   traZODone (DESYREL) 50 MG tablet TAKE 1 TABLET BY MOUTH EVERY DAY AT BEDTIME AS NEEDED   Facility-Administered Encounter Medications as of 03/05/2022  Medication   cyanocobalamin ((VITAMIN B-12)) injection 1,000 mcg   Reviewed chart prior to disease state call. Spoke with patient regarding BP  Recent Office Vitals: BP Readings from Last 3 Encounters:  01/16/22 (!) 174/82  01/15/22 (!) 164/82  11/27/21 (!) 182/82   Pulse Readings from Last 3 Encounters:  01/16/22 67  01/15/22 76  11/27/21 64    Wt Readings from Last 3  Encounters:  01/16/22 170 lb 4 oz (77.2 kg)  01/15/22 170 lb (77.1 kg)  11/27/21 183 lb (83 kg)     Kidney Function Lab Results  Component Value Date/Time   CREATININE 0.74 01/16/2022 03:17 PM   CREATININE 0.75 11/27/2021 11:51 AM   CREATININE 0.88 06/01/2021 01:07 PM   GFR 80.35 01/16/2022 03:17 PM   GFRNONAA 89 01/18/2021 12:19 PM   GFRAA 103 01/18/2021 12:19 PM       Latest Ref Rng & Units 01/16/2022    3:17 PM 11/27/2021   11:51 AM 06/01/2021    1:07 PM  BMP  Glucose 70 - 99 mg/dL 93  126  104   BUN 6 - 23 mg/dL 6  9  11    Creatinine 0.40 - 1.20 mg/dL 0.74  0.75  0.88   BUN/Creat Ratio 6 - 22 (calc)  NOT APPLICABLE  NOT APPLICABLE   Sodium 809 - 145 mEq/L 142  142  141   Potassium 3.5 - 5.1 mEq/L 4.1  3.8  3.9   Chloride 96 - 112 mEq/L 108  107  109   CO2 19 - 32 mEq/L 26  27  22    Calcium 8.4 - 10.5 mg/dL 10.0  9.5  9.6     Current antihypertensive regimen:  Amlodipine 10 mg daily Furosemide 20 mg daily Losartan 100 mg daily  How often are you checking your Blood  Pressure? infrequently  Current home BP readings: none to report  What recent interventions/DTPs have been made by any provider to improve Blood Pressure control since last CPP Visit: No recent interventions or DTPs  Any recent hospitalizations or ED visits since last visit with CPP? No  What diet changes have been made to improve Blood Pressure Control?  Patient states she doesn't have much of an appetite. She has lost some weight recently. Her current weight is 164.  What exercise is being done to improve your Blood Pressure Control?  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  Adherence Review: Is the patient currently on ACE/ARB medication? Yes Does the patient have >5 day gap between last estimated fill dates? No   Care Gaps: Medicare Annual Wellness: Completed 07/12/2021 Ophthalmology Exam: Kelly [name] - never done Foot Exam: Next due on 05/11/2022 Hemoglobin  A1C: 4.6% on 11/27/2021 Colonoscopy: Next due on 04/09/2028 Dexa Scan: Overdue - never done Mammogram: Ordered on 01/03/2021  Future Appointments  Date Time Provider [place]  04/25/2022  2:15 PM [facility]-CCM PHARMACIST [facility]  07/18/2022 11:15 AM [facility]SF[facility]-NURSE HEALTH ADVISOR [facility]   Star Rating Drugs: Atorvastatin 20 mg last filled 01/22/2022 90 DS Losartan 100 mg last filled 02/19/2022 90 DS  April D Calhoun, [place] Pharmacist Assistant [phone]

## 2022-03-18 ENCOUNTER — Telehealth: Payer: Self-pay | Admitting: Gastroenterology

## 2022-03-18 NOTE — Telephone Encounter (Signed)
The pt states that she has an EGD Colon on 8/31 but she has been having diarrhea at this time and wants to know if she can take imodium for a few days.  I did advise her that she can take imodium for a few days but she should stop prior to (at least a week) her upcoming colon.  She has agreed and will try that and let us know if this does not help. She also confirmed that she is aware of the time and MD change for 8/31 colon endo

## 2022-03-18 NOTE — Telephone Encounter (Signed)
Patient called states she has been having a lot of diarrhea and losing weight seeking some type of medication or something to help her.

## 2022-03-20 ENCOUNTER — Telehealth: Payer: Self-pay | Admitting: Gastroenterology

## 2022-03-20 NOTE — Telephone Encounter (Signed)
PT has an EGD at [facility] 04/04/2022.She is confused about the times and prep information. Please advise. Thank you.

## 2022-03-20 NOTE — Telephone Encounter (Signed)
The pt has been advised that her appt was changed due to Dr [name] being out of the office.  I did let her know that I mailed her new instructions to her home and to My Chart.  The pt has been advised of the information and verbalized understanding.

## 2022-03-26 ENCOUNTER — Telehealth: Payer: Self-pay

## 2022-03-26 NOTE — Telephone Encounter (Signed)
Spoke w/pt per Dr. [name] said Start miralax on a daily basis to try to stay regular and have BM daily.  Pt voiced understanding. Nothing further.

## 2022-03-26 NOTE — Telephone Encounter (Signed)
Pt called today stated that she is having some bowel issues to where she will only go every once in a while ie once /wk. Plus pt stated that her stomach aches a some pain scale of 4. Pt also said that she has no appetite or feels like eating.   Also, pt stated that today she did notice a 'tiny bit' of blood from her rectum. This was the only time, never had this happen before.   Pt wants to know if she should do anything.

## 2022-03-28 ENCOUNTER — Encounter (HOSPITAL_COMMUNITY): Payer: Self-pay | Admitting: Gastroenterology

## 2022-03-28 NOTE — Progress Notes (Signed)
Attempted to obtain medical history via telephone, unable to reach at this time. HIPAA compliant voicemail message left requesting return call to pre surgical testing department. 

## 2022-04-04 ENCOUNTER — Encounter (HOSPITAL_COMMUNITY): Payer: Self-pay | Admitting: Gastroenterology

## 2022-04-04 ENCOUNTER — Ambulatory Visit (HOSPITAL_BASED_OUTPATIENT_CLINIC_OR_DEPARTMENT_OTHER): Payer: Medicare HMO | Admitting: Anesthesiology

## 2022-04-04 ENCOUNTER — Ambulatory Visit (HOSPITAL_COMMUNITY)
Admission: RE | Admit: 2022-04-04 | Discharge: 2022-04-04 | Disposition: A | Discharge status: Home / Self Care | Payer: Medicare HMO | Attending: Gastroenterology | Admitting: Gastroenterology

## 2022-04-04 ENCOUNTER — Encounter (HOSPITAL_COMMUNITY)
Admission: RE | Disposition: A | Discharge status: Home / Self Care | Payer: Self-pay | Source: Home / Self Care | Attending: Gastroenterology

## 2022-04-04 ENCOUNTER — Other Ambulatory Visit: Payer: Self-pay

## 2022-04-04 ENCOUNTER — Ambulatory Visit (HOSPITAL_COMMUNITY): Payer: Medicare HMO | Admitting: Anesthesiology

## 2022-04-04 DIAGNOSIS — E119 Type 2 diabetes mellitus without complications: Secondary | ICD-10-CM | POA: Diagnosis not present

## 2022-04-04 DIAGNOSIS — K3189 Other diseases of stomach and duodenum: Secondary | ICD-10-CM

## 2022-04-04 DIAGNOSIS — K449 Diaphragmatic hernia without obstruction or gangrene: Secondary | ICD-10-CM | POA: Diagnosis not present

## 2022-04-04 DIAGNOSIS — K922 Gastrointestinal hemorrhage, unspecified: Secondary | ICD-10-CM | POA: Insufficient documentation

## 2022-04-04 DIAGNOSIS — K649 Unspecified hemorrhoids: Secondary | ICD-10-CM | POA: Diagnosis not present

## 2022-04-04 DIAGNOSIS — F17210 Nicotine dependence, cigarettes, uncomplicated: Secondary | ICD-10-CM | POA: Insufficient documentation

## 2022-04-04 DIAGNOSIS — Z6825 Body mass index (BMI) 25.0-25.9, adult: Secondary | ICD-10-CM | POA: Diagnosis not present

## 2022-04-04 DIAGNOSIS — K766 Portal hypertension: Secondary | ICD-10-CM | POA: Diagnosis not present

## 2022-04-04 DIAGNOSIS — K5730 Diverticulosis of large intestine without perforation or abscess without bleeding: Secondary | ICD-10-CM | POA: Diagnosis not present

## 2022-04-04 DIAGNOSIS — K2289 Other specified disease of esophagus: Secondary | ICD-10-CM

## 2022-04-04 DIAGNOSIS — I8500 Esophageal varices without bleeding: Secondary | ICD-10-CM

## 2022-04-04 DIAGNOSIS — Z953 Presence of xenogenic heart valve: Secondary | ICD-10-CM | POA: Diagnosis not present

## 2022-04-04 DIAGNOSIS — K641 Second degree hemorrhoids: Secondary | ICD-10-CM | POA: Diagnosis not present

## 2022-04-04 DIAGNOSIS — D759 Disease of blood and blood-forming organs, unspecified: Secondary | ICD-10-CM | POA: Diagnosis not present

## 2022-04-04 DIAGNOSIS — K5790 Diverticulosis of intestine, part unspecified, without perforation or abscess without bleeding: Secondary | ICD-10-CM

## 2022-04-04 DIAGNOSIS — K7460 Unspecified cirrhosis of liver: Secondary | ICD-10-CM

## 2022-04-04 DIAGNOSIS — E039 Hypothyroidism, unspecified: Secondary | ICD-10-CM | POA: Diagnosis not present

## 2022-04-04 DIAGNOSIS — K644 Residual hemorrhoidal skin tags: Secondary | ICD-10-CM | POA: Diagnosis not present

## 2022-04-04 DIAGNOSIS — D125 Benign neoplasm of sigmoid colon: Secondary | ICD-10-CM | POA: Insufficient documentation

## 2022-04-04 DIAGNOSIS — I8510 Secondary esophageal varices without bleeding: Secondary | ICD-10-CM | POA: Diagnosis not present

## 2022-04-04 DIAGNOSIS — K2970 Gastritis, unspecified, without bleeding: Secondary | ICD-10-CM | POA: Diagnosis not present

## 2022-04-04 DIAGNOSIS — Z09 Encounter for follow-up examination after completed treatment for conditions other than malignant neoplasm: Secondary | ICD-10-CM | POA: Diagnosis not present

## 2022-04-04 DIAGNOSIS — I10 Essential (primary) hypertension: Secondary | ICD-10-CM | POA: Insufficient documentation

## 2022-04-04 DIAGNOSIS — K635 Polyp of colon: Secondary | ICD-10-CM

## 2022-04-04 DIAGNOSIS — Z955 Presence of coronary angioplasty implant and graft: Secondary | ICD-10-CM | POA: Insufficient documentation

## 2022-04-04 DIAGNOSIS — K319 Disease of stomach and duodenum, unspecified: Secondary | ICD-10-CM | POA: Diagnosis not present

## 2022-04-04 DIAGNOSIS — I2510 Atherosclerotic heart disease of native coronary artery without angina pectoris: Secondary | ICD-10-CM | POA: Diagnosis not present

## 2022-04-04 DIAGNOSIS — R634 Abnormal weight loss: Secondary | ICD-10-CM | POA: Insufficient documentation

## 2022-04-04 HISTORY — PX: POLYPECTOMY: SHX5525

## 2022-04-04 HISTORY — PX: ESOPHAGOGASTRODUODENOSCOPY (EGD) WITH PROPOFOL: SHX5813

## 2022-04-04 HISTORY — PX: COLONOSCOPY WITH PROPOFOL: SHX5780

## 2022-04-04 HISTORY — PX: BIOPSY: SHX5522

## 2022-04-04 LAB — CBC
HCT: 35.1 % — ABNORMAL LOW (ref 36.0–46.0)
Hemoglobin: 12.5 g/dL (ref 12.0–15.0)
MCH: 30.3 pg (ref 26.0–34.0)
MCHC: 35.6 g/dL (ref 30.0–36.0)
MCV: 85.2 fL (ref 80.0–100.0)
Platelets: 54 10*3/uL — ABNORMAL LOW (ref 150–400)
RBC: 4.12 MIL/uL (ref 3.87–5.11)
RDW: 15.9 % — ABNORMAL HIGH (ref 11.5–15.5)
WBC: 5.4 10*3/uL (ref 4.0–10.5)
nRBC: 0 % (ref 0.0–0.2)

## 2022-04-04 LAB — PROTIME-INR
INR: 1.4 — ABNORMAL HIGH (ref 0.8–1.2)
Prothrombin Time: 16.8 seconds — ABNORMAL HIGH (ref 11.4–15.2)

## 2022-04-04 LAB — GLUCOSE, CAPILLARY: Glucose-Capillary: 96 mg/dL (ref 70–99)

## 2022-04-04 SURGERY — ESOPHAGOGASTRODUODENOSCOPY (EGD) WITH PROPOFOL
Anesthesia: Monitor Anesthesia Care

## 2022-04-04 MED ORDER — PROPOFOL 10 MG/ML IV BOLUS
INTRAVENOUS | Status: DC | PRN
  Administered 2022-04-04: 20 mg via INTRAVENOUS
  Administered 2022-04-04: 10 mg via INTRAVENOUS
  Administered 2022-04-04: 70 mg via INTRAVENOUS
  Administered 2022-04-04: 10 mg via INTRAVENOUS
  Administered 2022-04-04: 20 mg via INTRAVENOUS

## 2022-04-04 MED ORDER — LACTATED RINGERS IV SOLN: INTRAVENOUS | Status: DC

## 2022-04-04 MED ORDER — LIDOCAINE 2% (20 MG/ML) 5 ML SYRINGE
INTRAMUSCULAR | Status: DC | PRN
  Administered 2022-04-04: 70 mg via INTRAVENOUS

## 2022-04-04 MED ORDER — SODIUM CHLORIDE 0.9 % IV SOLN: INTRAVENOUS | Status: DC

## 2022-04-04 MED ORDER — NADOLOL 20 MG PO TABS: 10.0000 mg | ORAL_TABLET | Freq: Every day | ORAL | 12 refills | Status: DC

## 2022-04-04 MED ORDER — PROPOFOL 500 MG/50ML IV EMUL
INTRAVENOUS | Status: DC | PRN
  Administered 2022-04-04: 100 ug/kg/min via INTRAVENOUS

## 2022-04-04 SURGICAL SUPPLY — 25 items

## 2022-04-04 NOTE — Op Note (Signed)
[facility] Patient Name: Kelly [name] Procedure Date: 04/04/2022 MRN: [id number] Attending MD: Justice Britain , MD Date of Birth: [birth year] CSN: [id number] Age: 71 Admit Type: Outpatient Procedure:                Colonoscopy Indications:              Surveillance: Personal history of adenomatous                            polyps on last colonoscopy > 3 years ago Providers:                Justice Britain, MD, Burtis Junes, RN, [facility]                            Technician, Technician Referring MD:             Alonza Bogus PA, PA, [name], MD, [name] Medicines:                Monitored Anesthesia Care Complications:            No immediate complications. Estimated Blood Loss:     Estimated blood loss was minimal. Procedure:                Pre-Anesthesia Assessment:                           - Prior to the procedure, a History and Physical                            was performed, and patient medications and                            allergies were reviewed. The patient's tolerance of                            previous anesthesia was also reviewed. The risks                            and benefits of the procedure and the sedation                            options and risks were discussed with the patient.                            All questions were answered, and informed consent                            was obtained. Prior Anticoagulants: The patient has                            taken no previous anticoagulant or antiplatelet  agents. ASA Grade Assessment: III - A patient with                            severe systemic disease. After reviewing the risks                            and benefits, the patient was deemed in                            satisfactory condition to undergo the procedure.                           After obtaining informed consent, the colonoscope                             was passed under direct vision. Throughout the                            procedure, the patient's blood pressure, pulse, and                            oxygen saturations were monitored continuously. The                            [id number]-[id number] ([id number]) Olympus colonoscope was                            introduced through the anus and advanced to the 5                            cm into the ileum. The colonoscopy was performed                            without difficulty. The patient tolerated the                            procedure. The quality of the bowel preparation was                            good. The terminal ileum, ileocecal valve,                            appendiceal orifice, and rectum were photographed. Scope In: 4:14:59 PM Scope Out: 4:28:01 PM Scope Withdrawal Time: 0 hours 9 minutes 10 seconds  Total Procedure Duration: 0 hours 13 minutes 2 seconds  Findings:      The digital rectal exam findings include hemorrhoids. Pertinent       negatives include no palpable rectal lesions.      The terminal ileum and ileocecal valve appeared normal.      A 7 mm polyp was found in the sigmoid colon. The polyp was sessile. The       polyp was removed with a cold snare. Resection and retrieval were       complete.      The entire colon  was normal in endoscopic appearance. However, it was       significantly friable with mucosal contact bleeding occuring even with       light suction or passage of the colonoscope.      Multiple small-mouthed diverticula were found in the recto-sigmoid       colon, sigmoid colon, descending colon and transverse colon.      Small, non-bleeding rectal varices were found.      Non-bleeding non-thrombosed external and internal hemorrhoids were found       during retroflexion, during perianal exam and during digital exam. The       hemorrhoids were Grade II (internal hemorrhoids that prolapse but reduce       spontaneously). Impression:                - Hemorrhoids found on digital rectal exam.                           - The examined portion of the ileum was normal.                           - One 7 mm polyp in the sigmoid colon, removed with                            a cold snare. Resected and retrieved.                           - Friability with contact bleeding in the entire                            examined colon, but otherwise normal appearing                            mucosa throughout.                           - Diverticulosis in the recto-sigmoid colon, in the                            sigmoid colon, in the descending colon and in the                            transverse colon.                           - Very small rectal varices.                           - Non-bleeding non-thrombosed external and internal                            hemorrhoids. Moderate Sedation:      Not Applicable - Patient had care per Anesthesia. Recommendation:           - Proceed to scheduled EGD.                           - Continue present  medications.                           - Await pathology results.                           - Repeat colonoscopy in 5-7 years for surveillance                            based on pathology results and patient's medical                            comorbidities at the time.                           - The findings and recommendations were discussed                            with the patient.                           - The findings and recommendations were discussed                            with the patient's family. Procedure Code(s):        --- Professional ---                           [phone], Colonoscopy, flexible; with removal of                            tumor(s), polyp(s), or other lesion(s) by snare                            technique Diagnosis Code(s):        --- Professional ---                           Z86.010, Personal history of colonic polyps                           K64.1,  Second degree hemorrhoids                           K63.5, Polyp of colon                           K92.2, Gastrointestinal hemorrhage, unspecified                           K57.30, Diverticulosis of large intestine without                            perforation or abscess without bleeding CPT copyright 2019 American Medical Association. All rights reserved. The codes documented in this report are preliminary and upon coder review Julson  be revised to meet current compliance requirements. Justice Britain, MD 04/04/2022 4:49:33 PM Number of  Addenda: 0

## 2022-04-04 NOTE — Op Note (Signed)
[facility] Patient Name: Kelly [name] Procedure Date: 04/04/2022 MRN: [id number] Attending MD: Justice Britain , MD Date of Birth: 08/21/1950 CSN: [id number] Age: 71 Admit Type: Outpatient Procedure:                Upper GI endoscopy Indications:              Esophageal varices, Follow-up of esophageal varices Providers:                Justice Britain, MD, Burtis Junes, RN, [facility]                            Technician, Technician Referring MD:             Alonza Bogus PA, PA, [name]. Jonnie Finner, MD Medicines:                Monitored Anesthesia Care Complications:            No immediate complications. Estimated Blood Loss:     Estimated blood loss was minimal. Procedure:                Pre-Anesthesia Assessment:                           - Prior to the procedure, a History and Physical                            was performed, and patient medications and                            allergies were reviewed. The patient's tolerance of                            previous anesthesia was also reviewed. The risks                            and benefits of the procedure and the sedation                            options and risks were discussed with the patient.                            All questions were answered, and informed consent                            was obtained. Prior Anticoagulants: The patient has                            taken no previous anticoagulant or antiplatelet                            agents. ASA Grade Assessment: III - A patient with  severe systemic disease. After reviewing the risks                            and benefits, the patient was deemed in                            satisfactory condition to undergo the procedure.                           After obtaining informed consent, the endoscope was                            passed under direct vision. Throughout  the                            procedure, the patient's blood pressure, pulse, and                            oxygen saturations were monitored continuously. The                            [id number] ([id number]) Olympus therapeutic endoscope                            was introduced through the mouth, and advanced to                            the second part of duodenum. The upper GI endoscopy                            was accomplished without difficulty. The patient                            tolerated the procedure. Scope In: Scope Out: Findings:      No gross lesions were noted in the proximal esophagus and in the mid       esophagus.      Three columns of grade I and grade II varices with no bleeding and no       stigmata of recent bleeding were found in the distal esophagus extending       to the gastroesophageal junction,. No red wale signs were present. The       varices appeared unchanged in size from prior exam (per notation in       chart from 2019). As patient had done well previously on BB -       Propanolol, and there was not evidence of high-risk stigmata currently,       decision made not to pursue EVL today and rather restart NSBB.      The Z-line was irregular and was found 41 cm from the incisors.      Moderate portal hypertensive gastropathy was found in the entire       examined stomach.      Multiple dispersed diminutive erosions with no bleeding and no stigmata       of recent bleeding were found in the entire examined stomach. Biopsies  were taken with a cold forceps for histology and Helicobacter pylori       testing.      No gross lesions were noted in the duodenal bulb, in the first portion       of the duodenum and in the second portion of the duodenum. Impression:               - No gross lesions in esophagus proximally.                            Multiple columns of Grade I and grade II esophageal                            varices with no bleeding and  no stigmata of recent                            bleeding.                           - Z-line irregular, 41 cm from the incisors.                           - Portal hypertensive gastropathy throughout.                           - Erosive gastropathy with no bleeding and no                            stigmata of recent bleeding. Biopsied.                           - No gross lesions in the duodenal bulb, in the                            first portion of the duodenum and in the second                            portion of the duodenum. Moderate Sedation:      Not Applicable - Patient had care per Anesthesia. Recommendation:           - The patient will be observed post-procedure,                            until all discharge criteria are met.                           - Discharge patient to home.                           - Patient has a contact number available for                            emergencies. The signs and symptoms of potential  delayed complications were discussed with the                            patient. Return to normal activities tomorrow.                            Written discharge instructions were provided to the                            patient.                           - Resume previous diet.                           - Initiate Nadolol 10 mg daily (20 mg tablets to be                            given and over time Dena be able to increase up                            further) - but we want to monitor how patient does                            with this in regards to BP and HR control with goal                            to have HRs>50 and <60 bpm.                           - Continue present medications otherwise.                           - Await pathology results.                           - Repeat upper endoscopy in 2 years for                            surveillance.                           - Follow up in clinic in 4-6 weeks  to see how doing                            with Nadolol and consider uptitration if necessary.                           - NSBB use will decrease risk of bleeding, but will                            not make it impossible that she could have bleeding  in future, so she and husband are aware if any                            overt hematemesis or coffee-ground emesis were to                            occur in future, she should let our team know as                            she is heading into the hospital/ED for evaluation.                           - The findings and recommendations were discussed                            with the patient.                           - The findings and recommendations were discussed                            with the patient's family. Procedure Code(s):        --- Professional ---                           [phone], Esophagogastroduodenoscopy, flexible,                            transoral; with biopsy, single or multiple Diagnosis Code(s):        --- Professional ---                           I85.00, Esophageal varices without bleeding                           K22.8, Other specified diseases of esophagus                           K76.6, Portal hypertension                           K31.89, Other diseases of stomach and duodenum CPT copyright 2019 American Medical Association. All rights reserved. The codes documented in this report are preliminary and upon coder review Capell  be revised to meet current compliance requirements. Justice Britain, MD 04/04/2022 5:01:35 PM Number of Addenda: 0

## 2022-04-04 NOTE — Anesthesia Preprocedure Evaluation (Addendum)
Anesthesia Evaluation  Patient identified by MRN, date of birth, ID band Patient awake    Reviewed: Allergy & Precautions, NPO status , Patient's Chart, lab work & pertinent test results  Airway Mallampati: III  TM Distance: >3 FB Neck ROM: Full    Dental  (+) Partial Upper, Dental Advisory Given   Pulmonary Current SmokerPatient did not abstain from smoking.,    Pulmonary exam normal breath sounds clear to auscultation       Cardiovascular hypertension, Pt. on medications + CAD and + Cardiac Stents  Normal cardiovascular exam+ Valvular Problems/Murmurs (s/p TAVR) AS  Rhythm:Regular Rate:Normal  TTE 2021 1. Left ventricular ejection fraction, by estimation, is 60 to 65%. The  left ventricle has normal function. The left ventricle has no regional  wall motion abnormalities. There is moderate concentric left ventricular  hypertrophy. Left ventricular  diastolic parameters are consistent with Grade I diastolic dysfunction  (impaired relaxation). Elevated left ventricular end-diastolic pressure.  2. Right ventricular systolic function is normal. The right ventricular  size is normal. There is normal pulmonary artery systolic pressure.  3. Left atrial size was mildly dilated.  4. The mitral valve is normal in structure. Trivial mitral valve  regurgitation. No evidence of mitral stenosis.  5. Trivial perivalvular aortic regurgitation. TAVR aortic valve gradients  are unchanged from 05/2019. The aortic valve has been repaired/replaced.  Aortic valve regurgitation is trivial. There is a 23 mm CoreValve-Evolut  Pro prosthetic (TAVR) valve  present in the aortic position. Procedure Date: 04/20/2019.  6. The inferior vena cava is normal in size with greater than 50%  respiratory variability, suggesting right atrial pressure of 3 mmHg.   Cath 2020 Ost 1st Diag to 1st Diag lesion is 80% stenosed. Prox LAD lesion is 95%  stenosed. Mid LAD to Dist LAD lesion is 30% stenosed. 2nd Mrg lesion is 99% stenosed. Mid Cx to Dist Cx lesion is 50% stenosed. Prox RCA lesion is 70% stenosed. Ost RPDA lesion is 95% stenosed. Mid RCA lesion is 40% stenosed. RPAV lesion is 50% stenosed. A drug-eluting stent was successfully placed using a STENT RESOLUTE ONYX 3.5X30. Post intervention, there is a 0% residual stenosis. A drug-eluting stent was successfully placed using a STENT RESOLUTE ONYX 2.25X18. Post intervention, there is a 0% residual stenosis. 1. Successful PTCA/DES x 1 proximal RCA 2. Successful PTCA/DES x 1 mid LAD Will continue medical management of other CAD. Will continue workup for TAVR.   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)  Esophageal Varices    , Hepatitis -  Endo/Other  diabetes, Well ControlledHypothyroidism   Renal/GU negative Renal ROS  negative genitourinary   Musculoskeletal negative musculoskeletal ROS (+)   Abdominal   Peds  Hematology  (+) Blood dyscrasia (INR 1.4, plt 54), ,   Anesthesia Other Findings   Reproductive/Obstetrics                        Anesthesia Physical Anesthesia Plan  ASA: 3  Anesthesia Plan: MAC   Post-op Pain Management:    Induction: Intravenous  PONV Risk Score and Plan: Propofol infusion and Treatment Mannina vary due to age or medical condition  Airway Management Planned: Natural Airway  Additional Equipment:   Intra-op Plan:   Post-operative Plan:   Informed Consent: I have reviewed the patients History and Physical, chart, labs and discussed  the procedure including the risks, benefits and alternatives for the proposed anesthesia with the patient or authorized representative who has indicated his/her understanding and acceptance.     Dental advisory given  Plan Discussed  with: CRNA  Anesthesia Plan Comments:         Anesthesia Quick Evaluation

## 2022-04-04 NOTE — Transfer of Care (Signed)
Immediate Anesthesia Transfer of Care Note  Patient: Kelly [name]  Procedure(s) Performed: ESOPHAGOGASTRODUODENOSCOPY (EGD) WITH PROPOFOL COLONOSCOPY WITH PROPOFOL POLYPECTOMY  Patient Location: PACU  Anesthesia Type:MAC  Level of Consciousness: awake, alert , oriented and patient cooperative  Airway & Oxygen Therapy: Patient Spontanous Breathing and Patient connected to face mask oxygen  Post-op Assessment: Report given to RN and Post -op Vital signs reviewed and stable  Post vital signs: Reviewed and stable  Last Vitals:  Vitals Value Taken Time  BP    Temp    Pulse 66 04/04/22 1649  Resp 30 04/04/22 1649  SpO2 100 % 04/04/22 1649  Vitals shown include unvalidated device data.  Last Pain:  Vitals:   04/04/22 1502  TempSrc: Oral  PainSc: 0-No pain         Complications: No notable events documented.

## 2022-04-04 NOTE — H&P (Signed)
GASTROENTEROLOGY PROCEDURE H&P NOTE   Primary Care Physician: [name], MD  HPI: Kelly [name] is a 71 y.o. female who presents for EGD/Colonoscopy for evaluation of underlying cirrhosis, follow-up of esophageal varices these previously grade 2 and noted to be large per prior EGD report but patient is no longer taking beta-blocker), as well as a history of unintentional weight loss with need for colonoscopy.  Possible endoscopic band ligation will need to be considered pending what is found out and/or beta-blockade initiated.  Past Medical History:  Diagnosis Date   Anxiety    Chronic upper GI bleeding    intestinal AVM's, portal gastropathy, esophageal varices on egd 04/2018   Coronary artery disease involving native coronary artery without angina pectoris    Dementia ([place]) 09/16/2016   Depression    DIVERTICULITIS OF COLON 06/14/2008   Qualifier: Diagnosis of  By: Gretta Cool RN, Malachy Mood     DM (diabetes mellitus), type 2 ([place]) 09/15/2016   Esophageal varices determined by endoscopy ([place])    Hyperlipidemia    Hypertension    Hypothyroidism    Incidental pulmonary nodule, greater than or equal to 6m 10/14/2018   LLL nodule needs f/u CT or PET-CT scan   Irritable bowel syndrome 04/06/2008   Centricity Description: IBS Qualifier: Diagnosis of  By: [name] (ADeborra Medina, [name]   Centricity Description: IRRITABLE BOWEL SYNDROME Qualifier: Diagnosis of  By: [name], [name]   Liver cirrhosis secondary to NASH ([facility] 09/16/2016   NASH (nonalcoholic steatohepatitis)    Obesity    Portal hypertensive gastropathy ([place]    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 04/20/19: s/p TAVR wtih a 253mMedtronic CoreValve Evolut Pro via the TF approach   Severe aortic stenosis    Splenomegaly 09/16/2016   Thrombocytopenia ([place]   Thyroid disease    Tobacco abuse    Vitamin D deficiency    Past Surgical History:  Procedure Laterality Date   CARDIAC CATHETERIZATION      COLONOSCOPY WITH PROPOFOL N/A 04/09/2018   Procedure: COLONOSCOPY WITH PROPOFOL;  Surgeon: JaMilus BanisterMD;  [facility]: [facility];  Service: Endoscopy;  Laterality: N/A;   CORONARY ANGIOPLASTY     CORONARY STENT INTERVENTION N/A 02/11/2019   Procedure: CORONARY STENT INTERVENTION;  Surgeon: McBurnell BlanksMD;  [facility];  Service: Cardiovascular;  Laterality: N/A;  Prox RCA Prox LAD   ESOPHAGOGASTRODUODENOSCOPY (EGD) WITH PROPOFOL N/A 04/09/2018   Procedure: ESOPHAGOGASTRODUODENOSCOPY (EGD) WITH PROPOFOL;  Surgeon: JaMilus BanisterMD;  [facility]: [facility];  Service: Endoscopy;  Laterality: N/A;   oopherectomy     OVARIAN CYST REMOVAL     POLYPECTOMY  04/09/2018   Procedure: POLYPECTOMY;  Surgeon: JaMilus BanisterMD;  [facility];  Service: Endoscopy;;   RIGHT/LEFT HEART CATH AND CORONARY ANGIOGRAPHY N/A 10/07/2018   Procedure: RIGHT/LEFT HEART CATH AND CORONARY ANGIOGRAPHY;  Surgeon: McBurnell BlanksMD;  [facility];  Service: Cardiovascular;  Laterality: N/A;   TEE WITHOUT CARDIOVERSION N/A 04/20/2019   Procedure: TRANSESOPHAGEAL ECHOCARDIOGRAM (TEE);  Surgeon: McBurnell BlanksMD;  [facility] Service: Open [facility] Surgery;  Laterality: N/A;   TRANSCATHETER AORTIC VALVE REPLACEMENT, TRANSFEMORAL N/A 04/20/2019   Procedure: TRANSCATHETER AORTIC VALVE REPLACEMENT, TRANSFEMORAL approach using 2360mvolut PRO+ medtronic valve;  Surgeon: McABurnell BlanksD;  [facility];  Laterality: N/A;   Current Facility-Administered Medications  Medication Dose Route  Frequency Provider Last Rate Last Admin   0.9 %  sodium chloride infusion   Intravenous Continuous [name], MD       lactated ringers infusion   Intravenous Continuous Mansouraty, Telford Nab., MD        Current Facility-Administered Medications:    0.9 %  sodium chloride infusion, , Intravenous, Continuous,  [name], MD   lactated ringers infusion, , Intravenous, Continuous, Mansouraty, Telford Nab., MD Allergies  Allergen Reactions   Prednisone Itching    Heart rate increased   Celecoxib Rash   Macrodantin [Nitrofurantoin Macrocrystal] Rash   Family History  Problem Relation Age of Onset   Alzheimer's disease Mother    Dementia Mother    Other Mother        twisted colon    Lung cancer Father    Heart disease Father        MI at age 91   Alcohol abuse Father    Stomach cancer Neg Hx    Colon cancer Neg Hx    Esophageal cancer Neg Hx    Pancreatic cancer Neg Hx    Social History   Socioeconomic History   Marital status: Married    Spouse name: Not on file   Number of children: 1   Years of education: Not on file   Highest education level: Not on file  Occupational History   Not on file  Tobacco Use   Smoking status: Every Day    Packs/day: 0.50    Types: Cigarettes   Smokeless tobacco: Never  Vaping Use   Vaping Use: Never used  Substance and Sexual Activity   Alcohol use: No   Drug use: Not Currently    Types: Benzodiazepines   Sexual activity: Not Currently  Other Topics Concern   Not on file  Social History Narrative   Married x 28 years in 2023.   1 son   2 grandsons   Social Determinants of Health   Financial Resource Strain: Low Risk  (07/12/2021)   Overall Financial Resource Strain (CARDIA)    Difficulty of Paying Living Expenses: Not hard at all  Food Insecurity: No Food Insecurity (07/12/2021)   Hunger Vital Sign    Worried About Running Out of Food in the Last Year: Never true    Ran Out of Food in the Last Year: Never true  Transportation Needs: No Transportation Needs (07/12/2021)   PRAPARE - Hydrologist (Medical): No    Lack of Transportation (Non-Medical): No  Physical Activity: Inactive (07/12/2021)   Exercise Vital Sign    Days of Exercise per Week: 0 days    Minutes of Exercise per Session: 0 min   Stress: Stress Concern Present (07/12/2021)   [place]    Feeling of Stress : Rather much  Social Connections: Moderately Integrated (07/12/2021)   Social Connection and Isolation Panel [NHANES]    Frequency of Communication with Friends and Family: Three times a week    Frequency of Social Gatherings with Friends and Family: Once a week    Attends Religious Services: 1 to 4 times per year    Active Member of [employer] or Organizations: No    Attends Archivist Meetings: Never    Marital Status: Married  Human resources officer Violence: Not At Risk (07/12/2021)   Humiliation, Afraid, Rape, and Kick questionnaire    Fear of Current or Ex-Partner: No    Emotionally  Abused: No    Physically Abused: No    Sexually Abused: No    Physical Exam: There were no vitals filed for this visit. There is no height or weight on file to calculate BMI. GEN: NAD EYE: Sclerae anicteric ENT: MMM CV: Non-tachycardic GI: Soft, NT/ND NEURO:  Alert & Oriented x 3  Lab Results: No results for input(s): "WBC", "HGB", "HCT", "PLT" in the last 72 hours. BMET No results for input(s): "NA", "K", "CL", "CO2", "GLUCOSE", "BUN", "CREATININE", "CALCIUM" in the last 72 hours. LFT No results for input(s): "PROT", "ALBUMIN", "AST", "ALT", "ALKPHOS", "BILITOT", "BILIDIR", "IBILI" in the last 72 hours. PT/INR No results for input(s): "LABPROT", "INR" in the last 72 hours.   Impression / Plan: This is a 71 y.o.female who presents for EGD/Colonoscopy for evaluation of underlying cirrhosis, follow-up of esophageal varices these previously grade 2 and noted to be large per prior EGD report but patient is no longer taking beta-blocker), as well as a history of unintentional weight loss with need for colonoscopy.  Possible endoscopic band ligation will need to be considered pending what is found out and/or beta-blockade initiated.  The risks and benefits  of endoscopic evaluation/treatment were discussed with the patient and/or family; these include but are not limited to the risk of perforation, infection, bleeding, missed lesions, lack of diagnosis, severe illness requiring hospitalization, as well as anesthesia and sedation related illnesses.  The patient's history has been reviewed, patient examined, no change in status, and deemed stable for procedure.  The patient and/or family is agreeable to proceed.    Justice Britain, MD [facility] Advanced Endoscopy Office # [phone]

## 2022-04-05 NOTE — Anesthesia Postprocedure Evaluation (Signed)
Anesthesia Post Note  Patient: Kelly [name]  Procedure(s) Performed: ESOPHAGOGASTRODUODENOSCOPY (EGD) WITH PROPOFOL COLONOSCOPY WITH PROPOFOL POLYPECTOMY BIOPSY     Patient location during evaluation: Endoscopy Anesthesia Type: MAC Level of consciousness: awake and alert Pain management: pain level controlled Vital Signs Assessment: post-procedure vital signs reviewed and stable Respiratory status: spontaneous breathing, nonlabored ventilation, respiratory function stable and patient connected to nasal cannula oxygen Cardiovascular status: blood pressure returned to baseline and stable Postop Assessment: no apparent nausea or vomiting Anesthetic complications: no   No notable events documented.  Last Vitals:  Vitals:   04/04/22 1721 04/04/22 1722  BP: (!) 178/57   Pulse: (!) 56 (!) 56  Resp: 16 11  Temp:    SpO2: 98% 97%    Last Pain:  Vitals:   04/04/22 1649  TempSrc: Temporal  PainSc: 0-No pain   Pain Goal:                   Cillian Gwinner L Jeimy Bickert

## 2022-04-07 ENCOUNTER — Encounter (HOSPITAL_COMMUNITY): Payer: Self-pay | Admitting: Gastroenterology

## 2022-04-09 ENCOUNTER — Encounter: Payer: Self-pay | Admitting: Gastroenterology

## 2022-04-09 LAB — SURGICAL PATHOLOGY

## 2022-04-16 ENCOUNTER — Telehealth: Payer: Self-pay | Admitting: Gastroenterology

## 2022-04-16 NOTE — Telephone Encounter (Signed)
Inbound call from patient requesting results from colonoscopy and endoscopy on 8/31 that was done with Dr. Rush Landmark. Please advise.

## 2022-04-16 NOTE — Telephone Encounter (Signed)
I have made the pt aware that letters were mailed to her with the recent path results.  I also read those to her and told her she can access those in My Chart as well.  She has also been scheduled for a follow up with Dr Rush Landmark .  She will call if she has any concerns prior to that appt.

## 2022-04-18 NOTE — Progress Notes (Signed)
Chronic Care Management Pharmacy Note  08/10/2021 Name:  Kelly [name] MRN:  [id number] DOB:  08-12-1950  Summary: PharmD follow up.  Unclear what meds she is taking for depression/sleep.  I believe she only took fluoxetine for 3 days, and is no longer taking trazodone.  She also does not appear to be taking pantoprazole - reporting stomach issues.  Recommendations/Changes made from today's visit: Consider high intensity statin based on ASCVD risk Consult with PCP on fluoxetine, trazodone, pantoprazole.  Plan: FU 4 months    Subjective: Kelly [name] is an 71 y.o. year old female who is a primary patient of Pickard, [name], MD.  The CCM team was consulted for assistance with disease management and care coordination needs.    Engaged with patient by telephone for FU visit in response to provider referral for pharmacy case management and/or care coordination services.   Consent to Services:  The patient was given the following information about Chronic Care Management services today, agreed to services, and gave verbal consent: 1. CCM service includes personalized support from designated clinical staff supervised by the primary care provider, including individualized plan of care and coordination with other care providers 2. 24/7 contact phone numbers for assistance for urgent and routine care needs. 3. Service will only be billed when office clinical staff spend 20 minutes or more in a month to coordinate care. 4. Only one practitioner Delellis furnish and bill the service in a calendar month. 5.The patient Akel stop CCM services at any time (effective at the end of the month) by phone call to the office staff. 6. The patient will be responsible for cost sharing (co-pay) of up to 20% of the service fee (after annual deductible is met). Patient agreed to services and consent obtained.  Patient Care Team: [name], MD as PCP - General (Family Medicine) [name], MD as PCP -  Cardiology (Cardiology) [name], [facility] as Pharmacist (Pharmacist)  Recent office visits:  06/01/2021 OV (PCP) [name], MD; "I have recommended that we stop Ambien and Klonopin and avoid any centrally acting medication due to recent events.  I was honest with the patient about my concerns and explained to her in detail why I was worried about using Klonopin and Ambien.  Therefore I will no longer refill these medications. If her lab work is stable, if she insist on managing her anxiety, I will try sertraline 50 mg p.o. nightly as I feel that this is much safer for her and less apt to cause falls and confusion."   03/09/2021 VV (PCP) [name], MD;  "I recommended stopping the Ambien at night as the patient discussed.  We will start Klonopin 0.5 mg tablets.  She can take 1/2 to 1 tablet every 12 hours as needed for anxiety.  I will give her 30 tablets and I have encouraged her to use these sparingly.  I would be extremely happy if these 30 tablets lasts her 8 months again.  Have also cautioned her to be careful and not to combine the medication with hydrocodone which she takes perhaps once a day for pain in her back."   Recent consult visits:  05/11/2021 OV (Podiatry) [name], DPM; no medication changes indicated.   Hospital visits:  None in previous 6 months    Objective:  Lab Results  Component Value Date   CREATININE 0.75 11/27/2021   BUN 9 11/27/2021   GFR 71.88 02/03/2019   EGFR 84 11/27/2021  GFRNONAA 89 01/18/2021   GFRAA 103 01/18/2021   NA 142 11/27/2021   K 3.8 11/27/2021   CALCIUM 9.5 11/27/2021   CO2 27 11/27/2021   GLUCOSE 126 (H) 11/27/2021    Lab Results  Component Value Date/Time   HGBA1C 4.6 11/27/2021 11:51 AM   HGBA1C 4.8 06/01/2021 01:07 PM   GFR 71.88 02/03/2019 09:37 AM   GFR 80.02 07/13/2018 10:16 AM   MICROALBUR 1.5 01/24/2020 03:28 PM   MICROALBUR 1.7 09/08/2017 12:04 PM    Last diabetic Eye exam: No results found  for: HMDIABEYEEXA  Last diabetic Foot exam: No results found for: HMDIABFOOTEX   Lab Results  Component Value Date   CHOL 160 06/01/2021   HDL 57 06/01/2021   LDLCALC 83 06/01/2021   TRIG 106 06/01/2021   CHOLHDL 2.8 06/01/2021       Latest Ref Rng & Units 11/27/2021   11:51 AM 06/01/2021    1:07 PM 01/18/2021   12:19 PM  Hepatic Function  Total Protein 6.1 - 8.1 g/dL 6.4   6.6   6.7    AST 10 - 35 U/L 21   23   20     ALT 6 - 29 U/L 9   13   13     Total Bilirubin 0.2 - 1.2 mg/dL 1.2   1.2   1.0      Lab Results  Component Value Date/Time   TSH 1.77 11/27/2021 11:51 AM   TSH 5.59 (H) 06/01/2021 01:07 PM       Latest Ref Rng & Units 11/27/2021   11:51 AM 06/01/2021    1:07 PM 01/18/2021   12:19 PM  CBC  WBC 3.8 - 10.8 Thousand/uL 4.2   3.8   6.7    Hemoglobin 11.7 - 15.5 g/dL 12.1   11.8   12.0    Hematocrit 35.0 - 45.0 % 34.9   34.9   35.5    Platelets 140 - 400 Thousand/uL 57   58   76      No results found for: VD25OH  Clinical ASCVD: Yes  The 10-year ASCVD risk score (Arnett DK, et al., 2019) is: 66.6%   Values used to calculate the score:     Age: 10 years     Sex: Female     Is Non-Hispanic African American: No     Diabetic: Yes     Tobacco smoker: Yes     Systolic Blood Pressure: 932 mmHg     Is BP treated: Yes     HDL Cholesterol: 57 mg/dL     Total Cholesterol: 160 mg/dL       07/12/2021   11:13 AM 09/13/2020   11:54 AM 07/27/2020   12:24 PM  Depression screen PHQ 2/9  Decreased Interest 1 0 3  Down, Depressed, Hopeless 1 0 3  PHQ - 2 Score 2 0 6  Altered sleeping 1  3  Tired, decreased energy 1  3  Change in appetite 0  2  Feeling bad or failure about yourself  0  3  Trouble concentrating 0  2  Moving slowly or fidgety/restless 0  2  Suicidal thoughts 0  0  PHQ-9 Score 4  21  Difficult doing work/chores Somewhat difficult  Extremely dIfficult      Social History   Tobacco Use  Smoking Status Every Day   Packs/day: 0.50   Types:  Cigarettes  Smokeless Tobacco Never   BP Readings from Last 3 Encounters:  11/27/21 Marland Kitchen)  182/82  06/01/21 (!) 162/80  01/18/21 128/68   Pulse Readings from Last 3 Encounters:  11/27/21 64  06/01/21 66  01/18/21 82   Wt Readings from Last 3 Encounters:  11/27/21 183 lb (83 kg)  07/12/21 206 lb (93.4 kg)  06/01/21 208 lb 12.8 oz (94.7 kg)   BMI Readings from Last 3 Encounters:  11/27/21 30.45 kg/m  07/12/21 34.28 kg/m  06/01/21 35.29 kg/m   Assessment/Interventions: Review of patient past medical history, allergies, medications, health status, including review of consultants reports, laboratory and other test data, was performed as part of comprehensive evaluation and provision of chronic care management services.   SDOH:  (Social Determinants of Health) assessments and interventions performed: No, done within year  Financial Resource Strain: Low Risk  (07/12/2021)   Overall Financial Resource Strain (CARDIA)    Difficulty of Paying Living Expenses: Not hard at all    SDOH Screenings   Alcohol Screen: Low Risk    Last Alcohol Screening Score (AUDIT): 0  Depression (PHQ2-9): Low Risk    PHQ-2 Score: 4  Financial Resource Strain: Low Risk    Difficulty of Paying Living Expenses: Not hard at all  Food Insecurity: No Food Insecurity   Worried About Charity fundraiser in the Last Year: Never true   Ran Out of Food in the Last Year: Never true  Housing: Low Risk    Last Housing Risk Score: 0  Physical Activity: Inactive   Days of Exercise per Week: 0 days   Minutes of Exercise per Session: 0 min  Social Connections: Moderately Integrated   Frequency of Communication with Friends and Family: Three times a week   Frequency of Social Gatherings with Friends and Family: Once a week   Attends Religious Services: 1 to 4 times per year   Active Member of [employer] or Organizations: No   Attends Music therapist: Never   Marital Status: Married  Stress: Stress  Concern Present   Feeling of Stress : Rather much  Tobacco Use: High Risk   Smoking Tobacco Use: Every Day   Smokeless Tobacco Use: Never   Passive Exposure: Not on file  Transportation Needs: No Transportation Needs   Lack of Transportation (Medical): No   Lack of Transportation (Non-Medical): No    CCM Care Plan  Allergies  Allergen Reactions   Prednisone Itching    Heart rate increased   Celecoxib Rash   Macrodantin [Nitrofurantoin Macrocrystal] Rash    Medications Reviewed Today     Reviewed by Chriss Driver, LPN (Licensed Practical Nurse) on 07/12/21 at 101  Med List Status: <None>   Medication Order Taking? Sig Documenting Provider Last Dose Status Informant  ACCU-CHEK AVIVA PLUS test strip [id number] Yes  [provider] Taking Active Self  ACCU-CHEK SOFTCLIX LANCETS lancets [id number] Yes  [provider] Taking Active Self  amLODipine (NORVASC) 10 MG tablet [id number] Yes TAKE 1 TABLET BY MOUTH EVERY DAY [name], MD Taking Active   aspirin EC 81 MG tablet [id number] No Take 1 tablet (81 mg total) by mouth daily.  Patient not taking: Reported on 07/12/2021   [name], MD Not Taking Active Self  atorvastatin (LIPITOR) 20 MG tablet [id number] Yes TAKE 1 TABLET EVERY DAY [name], MD Taking Active   Blood Glucose Monitoring Suppl (ACCU-CHEK AVIVA PLUS) w/Device Drucie Opitz [id number] Yes  [provider] Taking Active Self  cyanocobalamin ((VITAMIN B-12)) injection 1,000 mcg [id number]   [name],  MD  Consider Medication Status and Discontinue (Patient Preference)   FEROSUL 325 (65 Fe) MG tablet [id number] Yes TAKE 1 TABLET EVERY DAY Pickard, [name], MD Taking Active   furosemide (LASIX) 20 MG tablet [id number] Yes Take 1 tablet (20 mg total) by mouth daily. [name], MD Taking Active   glipiZIDE (GLUCOTROL XL) 10 MG 24 hr tablet [id number] No Take 1 tablet (10 mg total) by mouth daily with breakfast.   Patient not taking: Reported on 07/12/2021   [name], MD Not Taking Active   levothyroxine (SYNTHROID) 88 MCG tablet [id number] Yes Take 1 tablet (88 mcg total) by mouth daily. [name], MD Taking Active   losartan (COZAAR) 100 MG tablet [id number] Yes TAKE 1 TABLET EVERY DAY [name], MD Taking Active   metFORMIN (GLUCOPHAGE-XR) 500 MG 24 hr tablet [id number] Yes TAKE 2 TABLETS TWICE DAILY [name], MD Taking Active   pantoprazole (PROTONIX) 40 MG tablet [id number] Yes TAKE 1 TABLET EVERY DAY [name], MD Taking Active   pioglitazone (ACTOS) 30 MG tablet [id number] No TAKE 1 TABLET EVERY DAY  Patient not taking: Reported on 07/12/2021   [name], MD Not Taking Active   potassium chloride (KLOR-CON) 10 MEQ tablet [id number] Yes TAKE 2 TABLETS (20 MEQ TOTAL) BY MOUTH DAILY. [name], MD Taking Active             Patient Active Problem List   Diagnosis Date Noted   Pain due to onychomycosis of toenails of both feet 12/18/2020   Acute on chronic diastolic heart failure ([place]) 04/21/2019   LBBB (left bundle branch block) 04/21/2019   S/P TAVR (transcatheter aortic valve replacement) 04/20/2019   Obesity    NASH (nonalcoholic steatohepatitis)    Tobacco abuse    Coronary artery disease involving native coronary artery without angina pectoris    Incidental pulmonary nodule, greater than or equal to 36m 10/14/2018   Severe aortic stenosis    Esophageal varices determined by endoscopy ([facility]    Polyp of transverse colon    Esophageal varices without bleeding ([place]    Portal hypertensive gastropathy ([place]    Iron deficiency anemia 03/06/2018   Vitamin B 12 deficiency 09/16/2016   Dementia ([place] 09/16/2016   Splenomegaly 09/16/2016   Liver cirrhosis secondary to NASH ([facility] 09/16/2016   Thrombocytopenia ([place] 09/15/2016   DM (diabetes mellitus), type 2 ([place] 09/15/2016   Hypertension 09/14/2016   Depression 09/14/2016    DIVERTICULITIS OF COLON 06/14/2008   Irritable bowel syndrome 04/06/2008    Immunization History  Administered Date(s) Administered   Fluad Quad(high Dose 65+) 04/21/2019, 04/28/2020   Influenza, High Dose Seasonal PF 06/02/2017, 04/15/2018   Influenza-Unspecified 06/05/2016   PFIZER(Purple Top)SARS-COV-2 Vaccination 07/10/2020   Pneumococcal Polysaccharide-23 04/21/2019    Conditions to be addressed/monitored:  HTN, CAD, DM, Dementia.Depression, Tobacco Use  Patient Care Plan: General Pharmacy (Adult)     Problem Identified: HTN, CAD, DM, Depression, Hypothyroidism   Priority: High  Onset Date: 08/24/2021     Long-Range Goal: Patient-Specific Goal   Start Date: 08/24/2021  Expected End Date: 02/21/2022  Recent Progress: On track  Priority: High  Note:   Current Barriers:  Stomach pain Depression meds  Pharmacist Clinical Goal(s):  Patient will achieve improvement in LDL as evidenced by labs through collaboration with PharmD and provider.   Interventions: 1:1 collaboration with PSusy Frizzle MD regarding development and update of comprehensive plan of care as evidenced by provider  attestation and co-signature Inter-disciplinary care team collaboration (see longitudinal plan of care) Comprehensive medication review performed; medication list updated in electronic medical record  Hypertension (BP goal <130/80) -Controlled, not assessed -Current treatment: Losartan 195m daily Appropriate, Effective, Safe, Accessible Amlodipine 142mdaily Appropriate, Effective, Safe, Accessible -Medications previously tried: metoprolol, nadolol, propranolol  -Current home readings: no logs provided today -Current dietary habits: usually only eating breakfast and lunch, does not have much of an appetite lately -Current exercise habits: "plenty" takes care of her husband, nothing routine -Reports hypotensive/hypertensive symptoms - occasional dizziness -Educated on Exercise goal of  150 minutes per week; Importance of home blood pressure monitoring; Symptoms of hypotension and importance of maintaining adequate hydration; -Counseled to monitor BP at home a few times per week, document, and provide log at future appointments -Recommended to continue current medication Counseled on going slow when switching from sitting to standing positions  Update 12/13/21 Reports BP has improved.  No need to make changes today until we see CT of abdomen and pelvis. Continue to monitor BP at home and contact usKoreaf consistently elevated above goal. No changes at this time.  Hyperlipidemia: (LDL goal < 70) 04/25/22 -Not ideally controlled, most recent LDL is 83 -Current treatment: Atorvastatin 2081maily Appropriate, Query Effective -Medications previously tried: none noted  -Current dietary patterns: see HTN -Current exercise habits: see HTN -Educated on Cholesterol goals;  Benefits of statin for ASCVD risk reduction; Importance of limiting foods high in cholesterol; -Recommended to continue current medication -ASCVD still high risk in patient with CAD.  Not quite at goal. Still would recommend increase to atorvastatin 73m71mwill consult with PCP on switch but patient needs to come in for updated lipid panel.  Diabetes (A1c goal <7%) -Controlled -Current medications: None -Medications previously tried: glipizide, glimerpiride  -Current home glucose readings fasting glucose: unknown post prandial glucose: unknown -Reports hypoglycemic/hyperglycemic symptoms - shakiness later in the day -Current meal patterns: inconsistent meals, skipping lunch often due to low appetite -Current exercise: caring for husband -Educated on A1c and blood sugar goals; Prevention and management of hypoglycemic episodes; Benefits of routine self-monitoring of blood sugar; -Counseled to check feet daily and get yearly eye exams -Recommended to continue current medication   Update 12/13/21 She  has stopped all of her medications for blood sugar. She is not checking at home but denies symptoms of hyper or hypoglycemia. Due to decreasing A1c and decreased appetite, I believe it is best that she remains off for now.  She mentions episode of severe epigastric pain when she needed to defecate. She has CT of the abdomen scheduled for next week. At this time no changes necessary.  Depression/Anxiety (Goal: Minimize symptoms) 04/25/22 -Controlled -Current treatment: Fluoxetine 73mg73mropriate, Query effective, Trazodone 50mg 68mopriate, Query effective, -Medications previously tried/failed: sertraline, clonazepam, trazodone -PHQ9:     07/12/2021   11:13 AM 09/13/2020   11:54 AM 07/27/2020   12:24 PM  PHQ9 SCORE ONLY  PHQ-9 Total Score 4 0 21  -She has been having problems with her stomach more than usual lately and has reported not being able to sleep. -Reports she took fluoxetine for 3 days - counseled on it taking 3-4 weeks for full benefit. -She is interested in new rx for trazodone but fill history shows refills left and filled in August for 90 days supply. I will consult with PCP but would like to see her take both fluoxetine for depression and trazodone to help with sleep.  I think she Santmyer  need to start pantoprazole for her stomach issues as it appears she has not been filling this.   Hypothyroidism (Goal: maintain TSH) -Controlled, not assessed -Current treatment  Levothyroxine 44mg daily Appropriate, Query effective, -Medications previously tried: none noted -Recently increased dose to 839m daily, taking appropriately. -Has not had TSH checked since increasing dose.  -Recommended to continue current medication Recommend recheck TSH for efficacy of current dose.  Patient Goals/Self-Care Activities Patient will:  - focus on medication adherence by pill box check glucose a few times per week, document, and provide at future appointments check blood pressure weekly,  document, and provide at future appointments  Follow Up Plan: The care management team will reach out to the patient again over the next 120 days.            Medication Assistance: None required.  Patient affirms current coverage meets needs.  Compliance/Adherence/Medication fill history: Care Gaps: Eye Exam DEXA  Star-Rating Drugs: Losartan 100 mg last filled 02/19/22 90 DS Atorvastatin 20 mg last filled 04/10/22 90 DS  Patient's preferred pharmacy is:  [id number] [place] [address] [place]: [phone]: [phone] [place]: [phone]: [phone] [facility] - [address] [place]: [phone]: [phone] pill box? Yes Pt endorses 100% compliance  We discussed: Benefits of medication synchronization, packaging and delivery as well as enhanced pharmacist oversight with [employer]. Patient decided to: Consider pending price  Care Plan and Follow Up Patient Decision:  Patient agrees to Care Plan and Follow-up.  Plan: The care management team will reach out to the patient again over the next 120 days.  ChrBeverly MilchharmD, CPP Clinical Pharmacist Practitioner [phone]

## 2022-04-25 ENCOUNTER — Ambulatory Visit: Payer: Medicare HMO | Admitting: Pharmacist

## 2022-04-25 ENCOUNTER — Telehealth: Payer: Self-pay

## 2022-04-25 DIAGNOSIS — F332 Major depressive disorder, recurrent severe without psychotic features: Secondary | ICD-10-CM

## 2022-04-25 DIAGNOSIS — E785 Hyperlipidemia, unspecified: Secondary | ICD-10-CM

## 2022-04-25 NOTE — Patient Instructions (Addendum)
Visit Information   Goals Addressed             This Visit's Progress    Monitor and Manage My Blood Sugar-Diabetes Type 2   On track    Timeframe:  Long-Range Goal Priority:  High Start Date:   08/24/21                          Expected End Date: 02/24/22                      Follow Up Date 11/25/21    - check blood sugar at prescribed times - check blood sugar if I feel it is too high or too low - enter blood sugar readings and medication or insulin into daily log    Why is this important?   Checking your blood sugar at home helps to keep it from getting very high or very low.  Writing the results in a diary or log helps the doctor know how to care for you.  Your blood sugar log should have the time, date and the results.  Also, write down the amount of insulin or other medicine that you take.  Other information, like what you ate, exercise done and how you were feeling, will also be helpful.     Notes:        Patient Care Plan: General Pharmacy (Adult)     Problem Identified: HTN, CAD, DM, Depression, Hypothyroidism   Priority: High  Onset Date: 08/24/2021     Long-Range Goal: Patient-Specific Goal   Start Date: 08/24/2021  Expected End Date: 02/21/2022  Recent Progress: On track  Priority: High  Note:   Current Barriers:  Stomach pain Depression meds  Pharmacist Clinical Goal(s):  Patient will achieve improvement in LDL as evidenced by labs through collaboration with PharmD and provider.   Interventions: 1:1 collaboration with [name], MD regarding development and update of comprehensive plan of care as evidenced by provider attestation and co-signature Inter-disciplinary care team collaboration (see longitudinal plan of care) Comprehensive medication review performed; medication list updated in electronic medical record  Hypertension (BP goal <130/80) -Controlled, not assessed -Current treatment: Losartan 145m daily Appropriate, Effective,  Safe, Accessible Amlodipine 180mdaily Appropriate, Effective, Safe, Accessible -Medications previously tried: metoprolol, nadolol, propranolol  -Current home readings: no logs provided today -Current dietary habits: usually only eating breakfast and lunch, does not have much of an appetite lately -Current exercise habits: "plenty" takes care of her husband, nothing routine -Reports hypotensive/hypertensive symptoms - occasional dizziness -Educated on Exercise goal of 150 minutes per week; Importance of home blood pressure monitoring; Symptoms of hypotension and importance of maintaining adequate hydration; -Counseled to monitor BP at home a few times per week, document, and provide log at future appointments -Recommended to continue current medication Counseled on going slow when switching from sitting to standing positions  Update 12/13/21 Reports BP has improved.  No need to make changes today until we see CT of abdomen and pelvis. Continue to monitor BP at home and contact usKoreaf consistently elevated above goal. No changes at this time.  Hyperlipidemia: (LDL goal < 70) 04/25/22 -Not ideally controlled, most recent LDL is 83 -Current treatment: Atorvastatin 2060maily Appropriate, Query Effective -Medications previously tried: none noted  -Current dietary patterns: see HTN -Current exercise habits: see HTN -Educated on Cholesterol goals;  Benefits of statin for ASCVD risk reduction; Importance of limiting foods high in  cholesterol; -Recommended to continue current medication -ASCVD still high risk in patient with CAD.  Not quite at goal. Still would recommend increase to atorvastatin 73m - will consult with PCP on switch but patient needs to come in for updated lipid panel.  Diabetes (A1c goal <7%) -Controlled -Current medications: None -Medications previously tried: glipizide, glimerpiride  -Current home glucose readings fasting glucose: unknown post prandial glucose:  unknown -Reports hypoglycemic/hyperglycemic symptoms - shakiness later in the day -Current meal patterns: inconsistent meals, skipping lunch often due to low appetite -Current exercise: caring for husband -Educated on A1c and blood sugar goals; Prevention and management of hypoglycemic episodes; Benefits of routine self-monitoring of blood sugar; -Counseled to check feet daily and get yearly eye exams -Recommended to continue current medication   Update 12/13/21 She has stopped all of her medications for blood sugar. She is not checking at home but denies symptoms of hyper or hypoglycemia. Due to decreasing A1c and decreased appetite, I believe it is best that she remains off for now.  She mentions episode of severe epigastric pain when she needed to defecate. She has CT of the abdomen scheduled for next week. At this time no changes necessary.  Depression/Anxiety (Goal: Minimize symptoms) 04/25/22 -Controlled -Current treatment: Fluoxetine 466mAppropriate, Query effective, Trazodone 5070mppropriate, Query effective, -Medications previously tried/failed: sertraline, clonazepam, trazodone -PHQ9:     07/12/2021   11:13 AM 09/13/2020   11:54 AM 07/27/2020   12:24 PM  PHQ9 SCORE ONLY  PHQ-9 Total Score 4 0 21  -She has been having problems with her stomach more than usual lately and has reported not being able to sleep. -Reports she took fluoxetine for 3 days - counseled on it taking 3-4 weeks for full benefit. -She is interested in new rx for trazodone but fill history shows refills left and filled in August for 90 days supply. I will consult with PCP but would like to see her take both fluoxetine for depression and trazodone to help with sleep.  I think she [name] need to start pantoprazole for her stomach issues as it appears she has not been filling this.   Hypothyroidism (Goal: maintain TSH) -Controlled, not assessed -Current treatment  Levothyroxine 62m51maily Appropriate,  Query effective, -Medications previously tried: none noted -Recently increased dose to 62mc51mily, taking appropriately. -Has not had TSH checked since increasing dose.  -Recommended to continue current medication Recommend recheck TSH for efficacy of current dose.  Patient Goals/Self-Care Activities Patient will:  - focus on medication adherence by pill box check glucose a few times per week, document, and provide at future appointments check blood pressure weekly, document, and provide at future appointments  Follow Up Plan: The care management team will reach out to the patient again over the next 120 days.          The patient verbalized understanding of instructions, educational materials, and care plan provided today and DECLINED offer to receive copy of patient instructions, educational materials, and care plan.  Telephone follow up appointment with pharmacy team member scheduled for: 6 months  ChrisEdythe Clarity  [place], CPP CV[place] Pharmacist Practitioner [place])[phone]

## 2022-04-25 NOTE — Telephone Encounter (Signed)
Pt states she received 2 separate letters stating she needs additional work ups but pt wanted to discuss them with you before scheduling.

## 2022-04-29 ENCOUNTER — Telehealth: Payer: Self-pay | Admitting: Pharmacist

## 2022-04-29 NOTE — Telephone Encounter (Signed)
-----   Message from [name], MD sent at 04/26/2022  5:56 AM EDT ----- No she should ----- Message ----- From: [name], [facility] Sent: 04/25/2022   3:25 PM EDT To: [name], MD  Patient had concerns over her meds and was unclear what she was really taking. Complains of stomach pains which I know has been ongoing, she was not taking her pantoprazole.  She also only took Prozaac for three days and was not taking her trazodone - I believe she thought she was supposed to stop for some reason.  I believe she would benefit from all 3 meds.  Do you see any reason she should not be taking them?  Beverly Milch, PharmD, CPP Clinical Pharmacist Practitioner [place] [phone]

## 2022-04-29 NOTE — Chronic Care Management (AMB) (Signed)
Called to make patient aware.  She states she has pantoprazole at home and fluoxetine.  Will call [facility] for refill of Trazodone.  Beverly Milch, PharmD, CPP Clinical Pharmacist Practitioner [place] [phone]

## 2022-04-30 ENCOUNTER — Telehealth: Payer: Self-pay

## 2022-04-30 ENCOUNTER — Other Ambulatory Visit: Payer: Self-pay | Admitting: Family Medicine

## 2022-04-30 MED ORDER — TRAZODONE HCL 50 MG PO TABS
ORAL_TABLET | ORAL | 2 refills | Status: DC
Start: 2022-04-30 — End: 2022-08-16

## 2022-04-30 NOTE — Telephone Encounter (Signed)
Pt called requesting a refill on her Trazodone. Last RF was 02/07/2022. Last OV was 01/15/2022. Thank you.

## 2022-05-14 ENCOUNTER — Ambulatory Visit: Payer: Medicare HMO | Admitting: Family Medicine

## 2022-05-15 ENCOUNTER — Other Ambulatory Visit: Payer: Self-pay | Admitting: Family Medicine

## 2022-05-16 NOTE — Telephone Encounter (Signed)
Requested medication (s) are due for refill today - no  Requested medication (s) are on the active medication list -yes  Future visit scheduled -yes  Last refill: 06/04/21 #90 3RF  Notes to clinic: Patient Rx states she is taking 75 mcg dose- sent for review of request  Requested Prescriptions  Pending Prescriptions Disp Refills   levothyroxine (SYNTHROID) 88 MCG tablet [Pharmacy Med Name: LEVOTHYROXINE SODIUM 88 MCG Tablet] 90 tablet 10    Sig: TAKE 1 TABLET EVERY DAY     Endocrinology:  Hypothyroid Agents Passed - 05/15/2022  7:49 PM      Passed - TSH in normal range and within 360 days    TSH  Date Value Ref Range Status  11/27/2021 1.77 0.40 - 4.50 mIU/L Final         Passed - Valid encounter within last 12 months    Recent Outpatient Visits           5 months ago Hematuria, unspecified type   [place] [name], MD   11 months ago Type 2 diabetes mellitus with other specified complication, without long-term current use of insulin ([place])   [place] Pickard, [name], MD   1 year ago Situational anxiety   [place] Pickard, [name], MD   1 year ago Lower abdominal pain   [place] [name], MD   1 year ago Type 2 diabetes mellitus with other specified complication, without long-term current use of insulin ([place])   [place] Pickard, [name], MD       Future Appointments             In 5 days Pickard, [name], MD [place]               Requested Prescriptions  Pending Prescriptions Disp Refills   levothyroxine (SYNTHROID) 88 MCG tablet [Pharmacy Med Name: LEVOTHYROXINE SODIUM 88 MCG Tablet] 90 tablet 10    Sig: TAKE 1 TABLET EVERY DAY     Endocrinology:  Hypothyroid Agents Passed - 05/15/2022  7:49 PM      Passed - TSH in normal range and within 360 days    TSH  Date Value Ref Range Status  11/27/2021 1.77 0.40 -  4.50 mIU/L Final         Passed - Valid encounter within last 12 months    Recent Outpatient Visits           5 months ago Hematuria, unspecified type   [place] [name], MD   11 months ago Type 2 diabetes mellitus with other specified complication, without long-term current use of insulin ([place])   [place] Pickard, [name], MD   1 year ago Situational anxiety   [place], [name], MD   1 year ago Lower abdominal pain   [place] [name], MD   1 year ago Type 2 diabetes mellitus with other specified complication, without long-term current use of insulin ([place])   [place] Pickard, [name], MD       Future Appointments             In 5 days Pickard, [name], MD [place]

## 2022-05-21 ENCOUNTER — Ambulatory Visit (INDEPENDENT_AMBULATORY_CARE_PROVIDER_SITE_OTHER): Payer: Medicare HMO | Admitting: Family Medicine

## 2022-05-21 VITALS — BP 134/78 | HR 50 | Ht 65.0 in | Wt 148.0 lb

## 2022-05-21 DIAGNOSIS — K7460 Unspecified cirrhosis of liver: Secondary | ICD-10-CM

## 2022-05-21 DIAGNOSIS — R634 Abnormal weight loss: Secondary | ICD-10-CM

## 2022-05-21 DIAGNOSIS — K7581 Nonalcoholic steatohepatitis (NASH): Secondary | ICD-10-CM | POA: Diagnosis not present

## 2022-05-21 NOTE — Progress Notes (Signed)
Subjective:    Patient ID: Kelly [name], female    DOB: 1951/02/15, 71 y.o.   MRN: [id number]     Patient had an EGD in August due to anemia and guaiac positive stool which showed esophageal varices but no obvious source of bleeding.  She also had a colonoscopy that revealed 1 polyp but it also revealed friable mucosal bleeding even with gentle suction.  CT 5/23 showed: IMPRESSION: Interval development of cirrhosis with evidence of portal venous hypertension with moderate splenomegaly and portosystemic collateralization via gastroesophageal and rectal varices. Superimposed mild hepatic steatosis.   Moderate right coronary artery calcification.   Moderate sigmoid diverticulosis.   Bilateral L5 pars defects with progressive, grade 1 anterolisthesis L5-S1. Wt Readings from Last 3 Encounters:  05/21/22 148 lb (67.1 kg)  04/04/22 150 lb (68 kg)  01/16/22 170 lb 4 oz (77.2 kg)  Patient is here today with her son primarily for discussion.  Initially I sent her to GI due to unexplained weight loss.  Over the last year she is lost essentially 50 pounds.  Thankfully over the last 2 months she has been remaining relatively stable around 150 pounds.  She had a CT scan, chest x-ray, lab work, and ultimately the EGD and colonoscopy that were listed above.  Work-up thus far has not given an explanation for the weight loss other than likely a combination of cirrhosis coupled with underlying dementia.  Family primarily wanted to discuss prognosis as far as what is causing this and what to expect  Past Medical History:  Diagnosis Date   Anxiety    Chronic upper GI bleeding    intestinal AVM's, portal gastropathy, esophageal varices on egd 04/2018   Coronary artery disease involving native coronary artery without angina pectoris    Dementia ([place]) 09/16/2016   Depression    DIVERTICULITIS OF COLON 06/14/2008   Qualifier: Diagnosis of  By: Gretta Cool RN, Malachy Mood     DM (diabetes mellitus), type 2 ([place])  09/15/2016   Esophageal varices determined by endoscopy ([place])    Hyperlipidemia    Hypertension    Hypothyroidism    Incidental pulmonary nodule, greater than or equal to 2m 10/14/2018   LLL nodule needs f/u CT or PET-CT scan   Irritable bowel syndrome 04/06/2008   Centricity Description: IBS Qualifier: Diagnosis of  By: [name] (ADeborra Medina, [name]   Centricity Description: IRRITABLE BOWEL SYNDROME Qualifier: Diagnosis of  By: [name], [name]   Liver cirrhosis secondary to NASH ([place] 09/16/2016   NASH (nonalcoholic steatohepatitis)    Obesity    Portal hypertensive gastropathy ([place]    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 04/20/19: s/p TAVR wtih a 236mMedtronic CoreValve Evolut Pro via the TF approach   Severe aortic stenosis    Splenomegaly 09/16/2016   Thrombocytopenia (HCC)    Thyroid disease    Tobacco abuse    Vitamin D deficiency    Past Surgical History:  Procedure Laterality Date   BIOPSY  04/04/2022   Procedure: BIOPSY;  Surgeon: MaIrving Copas MD;  [facility];  Service: Gastroenterology;;   CARDIAC CATHETERIZATION     COLONOSCOPY WITH PROPOFOL N/A 04/09/2018   Procedure: COLONOSCOPY WITH PROPOFOL;  Surgeon: JaMilus BanisterMD;  [facility];  Service: Endoscopy;  Laterality: N/A;   COLONOSCOPY WITH PROPOFOL N/A 04/04/2022   Procedure: COLONOSCOPY WITH PROPOFOL;  Surgeon: MaRush LandmarkaTelford Nab MD;  [facility];  Service: Gastroenterology;  Laterality: N/A;  CORONARY ANGIOPLASTY     CORONARY STENT INTERVENTION N/A 02/11/2019   Procedure: CORONARY STENT INTERVENTION;  Surgeon: [name], MD;  Location: [place] [facility];  Service: Cardiovascular;  Laterality: N/A;  Prox RCA Prox LAD   ESOPHAGOGASTRODUODENOSCOPY (EGD) WITH PROPOFOL N/A 04/09/2018   Procedure: ESOPHAGOGASTRODUODENOSCOPY (EGD) WITH PROPOFOL;  Surgeon: [name], MD;  [facility];  Service: Endoscopy;  Laterality:  N/A;   ESOPHAGOGASTRODUODENOSCOPY (EGD) WITH PROPOFOL N/A 04/04/2022   Procedure: ESOPHAGOGASTRODUODENOSCOPY (EGD) WITH PROPOFOL;  Surgeon: Rush Landmark Telford Nab., MD;  [facility];  Service: Gastroenterology;  Laterality: N/A;   oopherectomy     OVARIAN CYST REMOVAL     POLYPECTOMY  04/09/2018   Procedure: POLYPECTOMY;  Surgeon: [name], MD;  [facility];  Service: Endoscopy;;   POLYPECTOMY  04/04/2022   Procedure: POLYPECTOMY;  Surgeon: Irving Copas., MD;  [facility];  Service: Gastroenterology;;   RIGHT/LEFT HEART CATH AND CORONARY ANGIOGRAPHY N/A 10/07/2018   Procedure: RIGHT/LEFT HEART CATH AND CORONARY ANGIOGRAPHY;  Surgeon: [name], MD;  Location: [place] [facility];  Service: Cardiovascular;  Laterality: N/A;   TEE WITHOUT CARDIOVERSION N/A 04/20/2019   Procedure: TRANSESOPHAGEAL ECHOCARDIOGRAM (TEE);  Surgeon: [name], MD;  Location: [place];  Service: Open Heart Surgery;  Laterality: N/A;   TRANSCATHETER AORTIC VALVE REPLACEMENT, TRANSFEMORAL N/A 04/20/2019   Procedure: TRANSCATHETER AORTIC VALVE REPLACEMENT, TRANSFEMORAL approach using 68m Evolut PRO+ medtronic valve;  Surgeon: MBurnell Blanks MD;  [place]  Service: Open Heart Surgery;  Laterality: N/A;   Current Outpatient Medications on File Prior to Visit  Medication Sig Dispense Refill   ACCU-CHEK AVIVA PLUS test strip      ACCU-CHEK SOFTCLIX LANCETS lancets      atorvastatin (LIPITOR) 20 MG tablet TAKE 1 TABLET EVERY DAY 90 tablet 3   Blood Glucose Monitoring Suppl (ACCU-CHEK AVIVA PLUS) w/Device KIT      FEROSUL 325 (65 Fe) MG tablet TAKE 1 TABLET EVERY DAY 90 tablet 1   furosemide (LASIX) 20 MG tablet TAKE 1 TABLET EVERY DAY 90 tablet 1   levothyroxine (SYNTHROID) 88 MCG tablet TAKE 1 TABLET EVERY DAY 90 tablet 10   losartan (COZAAR) 100 MG tablet TAKE 1 TABLET EVERY DAY 90 tablet 1   nadolol (CORGARD) 20 MG tablet Take 0.5  tablets (10 mg total) by mouth daily. 30 tablet 12   pantoprazole (PROTONIX) 40 MG tablet TAKE 1 TABLET EVERY DAY (Patient taking differently: Take 40 mg by mouth at bedtime.) 90 tablet 3   potassium chloride (KLOR-CON) 10 MEQ tablet TAKE 2 TABLETS (20 MEQ TOTAL) BY MOUTH DAILY. 180 tablet 3   traZODone (DESYREL) 50 MG tablet TAKE 1 TABLET BY MOUTH EVERY DAY AT BEDTIME AS NEEDED 90 tablet 2   zolpidem (AMBIEN) 5 MG tablet Take 5 mg by mouth at bedtime as needed for sleep.     FLUoxetine (PROZAC) 40 MG capsule Take 1 capsule (40 mg total) by mouth daily. (Patient not taking: Reported on 05/21/2022) 90 capsule 3   Current Facility-Administered Medications on File Prior to Visit  Medication Dose Route Frequency Provider Last Rate Last Admin   cyanocobalamin ((VITAMIN B-12)) injection 1,000 mcg  1,000 mcg Subcutaneous Q30 days PSusy Frizzle MD   1,000 mcg at 09/08/17 1408    Allergies  Allergen Reactions   Prednisone Itching    Heart rate increased   Celecoxib Rash   Macrodantin [Nitrofurantoin Macrocrystal] Rash   Social History  Socioeconomic History   Marital status: Married    Spouse name: Not on file   Number of children: 1   Years of education: Not on file   Highest education level: Not on file  Occupational History   Not on file  Tobacco Use   Smoking status: Every Day    Packs/day: 0.50    Types: Cigarettes   Smokeless tobacco: Never  Vaping Use   Vaping Use: Never used  Substance and Sexual Activity   Alcohol use: No   Drug use: Not Currently    Types: Benzodiazepines   Sexual activity: Not Currently  Other Topics Concern   Not on file  Social History Narrative   Married x 53 years in 2023.   1 son   2 grandsons   Social Determinants of Health   Financial Resource Strain: Low Risk  (07/12/2021)   Overall Financial Resource Strain (CARDIA)    Difficulty of Paying Living Expenses: Not hard at all  Food Insecurity: No Food Insecurity (07/12/2021)   Hunger  Vital Sign    Worried About Running Out of Food in the Last Year: Never true    [place] in the Last Year: Never true  Transportation Needs: No Transportation Needs (07/12/2021)   PRAPARE - Hydrologist (Medical): No    Lack of Transportation (Non-Medical): No  Physical Activity: Inactive (07/12/2021)   Exercise Vital Sign    Days of Exercise per Week: 0 days    Minutes of Exercise per Session: 0 min  Stress: Stress Concern Present (07/12/2021)   [place]    Feeling of Stress : Rather much  Social Connections: Moderately Integrated (07/12/2021)   Social Connection and Isolation Panel [NHANES]    Frequency of Communication with Friends and Family: Three times a week    Frequency of Social Gatherings with Friends and Family: Once a week    Attends Religious Services: 1 to 4 times per year    Active Member of [employer] or Organizations: No    Attends Archivist Meetings: Never    Marital Status: Married  Human resources officer Violence: Not At Risk (07/12/2021)   Humiliation, Afraid, Rape, and Kick questionnaire    Fear of Current or Ex-Partner: No    Emotionally Abused: No    Physically Abused: No    Sexually Abused: No       Review of Systems  Gastrointestinal:  Positive for diarrhea.       Objective:   Physical Exam Constitutional:      Appearance: Normal appearance. She is obese.  Cardiovascular:     Rate and Rhythm: Normal rate and regular rhythm.     Heart sounds: Murmur heard.  Pulmonary:     Effort: Pulmonary effort is normal. No respiratory distress.     Breath sounds: Normal breath sounds. No wheezing, rhonchi or rales.  Abdominal:     General: Bowel sounds are normal.     Palpations: Abdomen is soft. There is no mass.     Tenderness: There is no guarding or rebound.  Neurological:     Mental Status: She is alert.          Assessment & Plan:  Loss of  weight  NASH (nonalcoholic steatohepatitis)  Cirrhosis of liver without ascites, unspecified hepatic cirrhosis type ([place]) I believe the patient's weight loss is due to a combination of Karlene Lineman coupled with her dementia.  At the  present time, her prognosis is tentative.  She has multiple comorbidities including dementia, congestive heart failure, coronary artery disease, all of which increase her long-term morbidity risk.  However at the present time she did demonstrates no evidence of hepatic encephalopathy, asterixis, jaundice.  She does have an elevated INR and easy bleeding.  At the present time I think her prognosis is stable.  I explained to the family that I am concerned she is at higher risk for complications due to a serious illness.  I would be afraid that her liver would likely exacerbate an underlying pneumonia or COVID or the flu etc.  Therefore I reiterated the need to avoid any sedating medications to try to prevent falls and injuries, trying to avoid communicable illness is much as possible.

## 2022-05-22 ENCOUNTER — Telehealth: Payer: Self-pay

## 2022-05-22 NOTE — Telephone Encounter (Signed)
Pt's husband called asking if there are any medications that the patient needs to stop or take differently since her visit on Tuesday.

## 2022-05-31 ENCOUNTER — Ambulatory Visit: Payer: Medicare HMO | Admitting: Podiatry

## 2022-06-05 ENCOUNTER — Ambulatory Visit: Payer: Medicare HMO | Admitting: Podiatry

## 2022-06-05 ENCOUNTER — Encounter: Payer: Self-pay | Admitting: Podiatry

## 2022-06-05 DIAGNOSIS — M79674 Pain in right toe(s): Secondary | ICD-10-CM

## 2022-06-05 DIAGNOSIS — E119 Type 2 diabetes mellitus without complications: Secondary | ICD-10-CM | POA: Diagnosis not present

## 2022-06-05 DIAGNOSIS — D696 Thrombocytopenia, unspecified: Secondary | ICD-10-CM | POA: Diagnosis not present

## 2022-06-05 DIAGNOSIS — B351 Tinea unguium: Secondary | ICD-10-CM

## 2022-06-05 DIAGNOSIS — M79675 Pain in left toe(s): Secondary | ICD-10-CM

## 2022-06-05 NOTE — Progress Notes (Signed)
This patient returns to my office for at risk foot care.  This patient requires this care by a professional since this patient will be at risk due to having thrombocytopenia and diabetes.  This patient is unable to cut nails herself since the patient cannot reach her nails.These nails are painful walking and wearing shoes.  This patient presents for at risk foot care today.  General Appearance  Alert, conversant and in no acute stress.  Vascular  Dorsalis pedis and posterior tibial  pulses are palpable  bilaterally.  Capillary return is within normal limits  bilaterally. Temperature is within normal limits  bilaterally.  Neurologic  Senn-Weinstein monofilament wire test within normal limits  bilaterally. Muscle power within normal limits bilaterally.  Nails Thick disfigured discolored nails with subungual debris  from hallux to fifth toes bilaterally. No evidence of bacterial infection or drainage bilaterally.  Orthopedic  No limitations of motion  feet .  No crepitus or effusions noted.  No bony pathology or digital deformities noted.  HAV  B/L  Skin  normotropic skin with no porokeratosis noted bilaterally.  No signs of infections or ulcers noted.     Onychomycosis  Pain in right toes  Pain in left toes  Consent was obtained for treatment procedures.   Mechanical debridement of nails 1-5  bilaterally performed with a nail nipper.  Filed with dremel without incident.    Return office visit  3 months                    Told patient to return for periodic foot care and evaluation due to potential at risk complications.   [name] DPM

## 2022-06-07 ENCOUNTER — Other Ambulatory Visit: Payer: Self-pay | Admitting: Cardiovascular Disease

## 2022-06-12 ENCOUNTER — Telehealth: Payer: Self-pay | Admitting: Family Medicine

## 2022-06-12 NOTE — Telephone Encounter (Signed)
Called patient to reschedule medicare AWV appt currently scheduled for 07/18/22. Patient's landline busy; mailbox on cell full. Spoke with son [place] who will relay the message.

## 2022-06-19 ENCOUNTER — Ambulatory Visit: Payer: Medicare HMO | Admitting: Gastroenterology

## 2022-06-19 ENCOUNTER — Encounter: Payer: Self-pay | Admitting: Gastroenterology

## 2022-06-19 ENCOUNTER — Other Ambulatory Visit (INDEPENDENT_AMBULATORY_CARE_PROVIDER_SITE_OTHER): Payer: Medicare HMO

## 2022-06-19 VITALS — BP 138/80 | HR 55 | Ht 64.5 in | Wt 152.0 lb

## 2022-06-19 DIAGNOSIS — K7581 Nonalcoholic steatohepatitis (NASH): Secondary | ICD-10-CM

## 2022-06-19 DIAGNOSIS — I8500 Esophageal varices without bleeding: Secondary | ICD-10-CM | POA: Diagnosis not present

## 2022-06-19 DIAGNOSIS — Z86010 Personal history of colonic polyps: Secondary | ICD-10-CM | POA: Diagnosis not present

## 2022-06-19 DIAGNOSIS — K7460 Unspecified cirrhosis of liver: Secondary | ICD-10-CM

## 2022-06-19 DIAGNOSIS — K745 Biliary cirrhosis, unspecified: Secondary | ICD-10-CM | POA: Diagnosis not present

## 2022-06-19 DIAGNOSIS — K7469 Other cirrhosis of liver: Secondary | ICD-10-CM | POA: Diagnosis not present

## 2022-06-19 DIAGNOSIS — R630 Anorexia: Secondary | ICD-10-CM

## 2022-06-19 DIAGNOSIS — K5900 Constipation, unspecified: Secondary | ICD-10-CM | POA: Diagnosis not present

## 2022-06-19 DIAGNOSIS — K744 Secondary biliary cirrhosis: Secondary | ICD-10-CM | POA: Diagnosis not present

## 2022-06-19 LAB — COMPREHENSIVE METABOLIC PANEL
ALT: 15 U/L (ref 0–35)
AST: 29 U/L (ref 0–37)
Albumin: 3.5 g/dL (ref 3.5–5.2)
Alkaline Phosphatase: 97 U/L (ref 39–117)
BUN: 7 mg/dL (ref 6–23)
CO2: 26 mEq/L (ref 19–32)
Calcium: 8.8 mg/dL (ref 8.4–10.5)
Chloride: 110 mEq/L (ref 96–112)
Creatinine, Ser: 0.54 mg/dL (ref 0.40–1.20)
GFR: 91.17 mL/min (ref >60.00)
Glucose, Bld: 97 mg/dL (ref 70–99)
Potassium: 3.3 mEq/L — ABNORMAL LOW (ref 3.5–5.1)
Sodium: 142 mEq/L (ref 135–145)
Total Bilirubin: 1.8 mg/dL — ABNORMAL HIGH (ref 0.2–1.2)
Total Protein: 6.2 g/dL (ref 6.0–8.3)

## 2022-06-19 LAB — CBC
HCT: 35.7 % — ABNORMAL LOW (ref 36.0–46.0)
Hemoglobin: 12.5 g/dL (ref 12.0–15.0)
MCHC: 35.1 g/dL (ref 30.0–36.0)
MCV: 85.5 fl (ref 78.0–100.0)
Platelets: 63 10*3/uL — ABNORMAL LOW (ref 150.0–400.0)
RBC: 4.18 Mil/uL (ref 3.87–5.11)
RDW: 15.7 % — ABNORMAL HIGH (ref 11.5–15.5)
WBC: 5.9 10*3/uL (ref 4.0–10.5)

## 2022-06-19 LAB — PROTIME-INR
INR: 1.2 ratio — ABNORMAL HIGH (ref 0.8–1.0)
Prothrombin Time: 12.8 s (ref 9.6–13.1)

## 2022-06-19 NOTE — Patient Instructions (Signed)
Your provider has requested that you go to the basement level for lab work before leaving today. Press "B" on the elevator. The lab is located at the first door on the left as you exit the elevator.  Follow up in 3 months. Office will contact you to schedule.   Toileting tips to help with your constipation - Drink at least 64-80 ounces of water/liquid per day. - Establish a time to try to move your bowels every day.  For many people, this is after a cup of coffee or after a meal such as breakfast. - Sit all of the way back on the toilet keeping your back fairly straight and while sitting up, try to rest the tops of your forearms on your upper thighs.   - Raising your feet with a step stool/squatty potty can be helpful to improve the angle that allows your stool to pass through the rectum. - Relax the rectum feeling it bulge toward the toilet water.  If you feel your rectum raising toward your body, you are contracting rather than relaxing. - Breathe in and slowly exhale. "Belly breath" by expanding your belly towards your belly button. Keep belly expanded as you gently direct pressure down and back to the anus.  A low pitched GRRR sound can assist with increasing intra-abdominal pressure.  - Repeat 3-4 times. If unsuccessful, contract the pelvic floor to restore normal tone and get off the toilet.  Avoid excessive straining. - To reduce excessive wiping by teaching your anus to normally contract, place hands on outer aspect of knees and resist knee movement outward.  Hold 5-10 second then place hands just inside of knees and resist inward movement of knees.  Hold 5 seconds.  Repeat a few times each way.   Thank you for choosing me and [facility].  Dr. Rush Landmark

## 2022-06-19 NOTE — Progress Notes (Unsigned)
[facility] VISIT   Primary Care Provider [name], MD [address] [place] [address] [phone]  Patient Profile: Kelly [name] is a 71 y.o. 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  The patient presents to the [facility] for an evaluation and management of problem(s) noted below:  Problem List 1. Liver cirrhosis secondary to NASH ([place])   2. Esophageal varices determined by endoscopy ([place])   3. Constipation, unspecified constipation type   4. Hx of adenomatous colonic polyps     History of Present Illness Please see prior GI notes for full details of HPI.  Interval History This is a patient who has followed with Dr. [name] for years.  I met the patient in August for an EGD/colonoscopy scheduled to reevaluate and update findings on recent imaging did not suggest a true development of cirrhosis.  She had known esophageal varices that were seen as well as portal gastropathy.  She also was found to have rectal varices and colon polyps.  The patient is accompanied today by her husband.  Patient states that overall she has been doing well.  Patient's husband has some questions in regards to this stage/status of her underlying liver disease.  The patient does/does not take NSAIDs or BC/Goody Powder. Patient has/has not had an EGD. Patient has/has not had a Colonoscopy.  GI Review of Systems Positive as above Negative for  Pyrosis; Reflux; Regurgitation; Dysphagia; Odynophagia; Globus; Post-prandial cough; Nocturnal cough; Nasal regurgitation; Epigastric pain; Nausea; Vomiting; Hematemesis; Jaundice; Change in Appetite; Early satiety; Abdominal pain; Abdominal bloating; Eructation; Flatulence; Change in BM Frequency; Change in BM Consistency; Constipation;  Diarrhea; Incontinence; Urgency; Tenesmus; Hematochezia; Melena  Review of Systems General: Denies fevers/chills/weight loss/night sweats HEENT: Denies oral lesions/sore throat/headaches/visual changes Cardiovascular: Denies chest pain/palpitations Pulmonary: Denies shortness of breath/cough Gastroenterological: See HPI Genitourinary: Denies darkened urine or hematuria Hematological: Denies easy bruising/bleeding Endocrine: Denies temperature intolerance Dermatological: Denies skin changes Psychological: Mood is stable Allergy & Immunology: Denies severe allergic reactions Musculoskeletal: Denies new arthralgias   Medications Current Outpatient Medications  Medication Sig Dispense Refill   ACCU-CHEK AVIVA PLUS test strip      ACCU-CHEK SOFTCLIX LANCETS lancets      atorvastatin (LIPITOR) 20 MG tablet TAKE 1 TABLET EVERY DAY 90 tablet 3   Blood Glucose Monitoring Suppl (ACCU-CHEK AVIVA PLUS) w/Device KIT      FEROSUL 325 (65 Fe) MG tablet TAKE 1 TABLET EVERY DAY 90 tablet 1   furosemide (LASIX) 20 MG tablet TAKE 1 TABLET EVERY DAY 90 tablet 1   levothyroxine (SYNTHROID) 88 MCG tablet TAKE 1 TABLET EVERY DAY 90 tablet 10   losartan (COZAAR) 100 MG tablet TAKE 1 TABLET EVERY DAY 90 tablet 1   nadolol (CORGARD) 20 MG tablet Take 0.5 tablets (10 mg total) by mouth daily. 30 tablet 12   pantoprazole (PROTONIX) 40 MG tablet TAKE 1 TABLET EVERY DAY (Patient taking differently: Take 40 mg by mouth at bedtime.) 90 tablet 3   potassium chloride (KLOR-CON M) 10 MEQ tablet TAKE 2 TABLETS EVERY DAY 30 tablet 0   traZODone (DESYREL) 50 MG tablet TAKE 1 TABLET BY MOUTH EVERY DAY AT BEDTIME AS NEEDED 90 tablet 2   zolpidem (AMBIEN) 5 MG tablet Take 5 mg by mouth at bedtime as needed for sleep.     FLUoxetine (PROZAC) 40  MG capsule Take 1 capsule (40 mg total) by mouth daily. (Patient not taking: Reported on 05/21/2022) 90 capsule 3   Current Facility-Administered Medications  Medication Dose Route  Frequency Provider Last Rate Last Admin   cyanocobalamin ((VITAMIN B-12)) injection 1,000 mcg  1,000 mcg Subcutaneous Q30 days [name], MD   1,000 mcg at 09/08/17 1408    Allergies Allergies  Allergen Reactions   Prednisone Itching    Heart rate increased   Celecoxib Rash   Macrodantin [Nitrofurantoin Macrocrystal] Rash    Histories Past Medical History:  Diagnosis Date   Anxiety    Chronic upper GI bleeding    intestinal AVM's, portal gastropathy, esophageal varices on egd 04/2018   Coronary artery disease involving native coronary artery without angina pectoris    Dementia ([place]) 09/16/2016   Depression    DIVERTICULITIS OF COLON 06/14/2008   Qualifier: Diagnosis of  By: Gretta Cool RN, Malachy Mood     DM (diabetes mellitus), type 2 ([place]) 09/15/2016   Esophageal varices determined by endoscopy ([place])    Hyperlipidemia    Hypertension    Hypothyroidism    Incidental pulmonary nodule, greater than or equal to 62m 10/14/2018   LLL nodule needs f/u CT or PET-CT scan   Irritable bowel syndrome 04/06/2008   Centricity Description: IBS Qualifier: Diagnosis of  By: [name] ([name], [name]   Centricity Description: IRRITABLE BOWEL SYNDROME Qualifier: Diagnosis of  By: [name], [name]   Liver cirrhosis secondary to NASH ([place] 09/16/2016   NASH (nonalcoholic steatohepatitis)    Obesity    Portal hypertensive gastropathy ([place]    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 04/20/19: s/p TAVR wtih a 256mMedtronic CoreValve Evolut Pro via the TF approach   Severe aortic stenosis    Splenomegaly 09/16/2016   Thrombocytopenia (H[facility])    Thyroid disease    Tobacco abuse    Vitamin D deficiency    Past Surgical History:  Procedure Laterality Date   BIOPSY  04/04/2022   Procedure: BIOPSY;  Surgeon: MaIrving Copas MD;  [facility];  Service: Gastroenterology;;   CARDIAC CATHETERIZATION     COLONOSCOPY WITH PROPOFOL N/A 04/09/2018   Procedure:  COLONOSCOPY WITH PROPOFOL;  Surgeon: JaMilus BanisterMD;  [facility];  Service: Endoscopy;  Laterality: N/A;   COLONOSCOPY WITH PROPOFOL N/A 04/04/2022   Procedure: COLONOSCOPY WITH PROPOFOL;  Surgeon: MaRush LandmarkaTelford Nab MD;  [facility];  Service: Gastroenterology;  Laterality: N/A;   CORONARY ANGIOPLASTY     CORONARY STENT INTERVENTION N/A 02/11/2019   Procedure: CORONARY STENT INTERVENTION;  Surgeon: McBurnell BlanksMD;  [facility];  Service: Cardiovascular;  Laterality: N/A;  Prox RCA Prox LAD   ESOPHAGOGASTRODUODENOSCOPY (EGD) WITH PROPOFOL N/A 04/09/2018   Procedure: ESOPHAGOGASTRODUODENOSCOPY (EGD) WITH PROPOFOL;  Surgeon: JaMilus BanisterMD;  [facility];  Service: Endoscopy;  Laterality: N/A;   ESOPHAGOGASTRODUODENOSCOPY (EGD) WITH PROPOFOL N/A 04/04/2022   Procedure: ESOPHAGOGASTRODUODENOSCOPY (EGD) WITH PROPOFOL;  Surgeon: MaRush LandmarkaTelford Nab MD;  [facility];  Service: Gastroenterology;  Laterality: N/A;   oopherectomy     OVARIAN CYST REMOVAL     POLYPECTOMY  04/09/2018   Procedure: POLYPECTOMY;  Surgeon: JaMilus BanisterMD;  [facility];  Service: Endoscopy;;   POLYPECTOMY  04/04/2022   Procedure: POLYPECTOMY;  Surgeon: MaIrving Copas MD;  [facility];  Service: Gastroenterology;;   RIGHT/LEFT HEART CATH AND CORONARY ANGIOGRAPHY N/A 10/07/2018  Procedure: RIGHT/LEFT HEART CATH AND CORONARY ANGIOGRAPHY;  Surgeon: [name], MD;  Location: [facility];  Service: Cardiovascular;  Laterality: N/A;   TEE WITHOUT CARDIOVERSION N/A 04/20/2019   Procedure: TRANSESOPHAGEAL ECHOCARDIOGRAM (TEE);  Surgeon: [name], MD;  Location: [facility];  Service: Open Heart Surgery;  Laterality: N/A;   TRANSCATHETER AORTIC VALVE REPLACEMENT, TRANSFEMORAL N/A 04/20/2019   Procedure: TRANSCATHETER AORTIC VALVE REPLACEMENT, TRANSFEMORAL approach using 53m Evolut PRO+  medtronic valve;  Surgeon: MBurnell Blanks MD;  [facility]  Service: Open Heart Surgery;  Laterality: N/A;   Social History   Socioeconomic History   Marital status: Married    Spouse name: Not on file   Number of children: 1   Years of education: Not on file   Highest education level: Not on file  Occupational History   Not on file  Tobacco Use   Smoking status: Every Day    Packs/day: 0.50    Types: Cigarettes   Smokeless tobacco: Never  Vaping Use   Vaping Use: Never used  Substance and Sexual Activity   Alcohol use: No   Drug use: Not Currently    Types: Benzodiazepines   Sexual activity: Not Currently  Other Topics Concern   Not on file  Social History Narrative   Married x 513years in 2023.   1 son   2 grandsons   Social Determinants of Health   Financial Resource Strain: Low Risk  (07/12/2021)   Overall Financial Resource Strain (CARDIA)    Difficulty of Paying Living Expenses: Not hard at all  Food Insecurity: No Food Insecurity (07/12/2021)   Hunger Vital Sign    Worried About Running Out of Food in the Last Year: Never true    RSharonin the Last Year: Never true  Transportation Needs: No Transportation Needs (07/12/2021)   PRAPARE - THydrologist(Medical): No    Lack of Transportation (Non-Medical): No  Physical Activity: Inactive (07/12/2021)   Exercise Vital Sign    Days of Exercise per Week: 0 days    Minutes of Exercise per Session: 0 min  Stress: Stress Concern Present (07/12/2021)   [place]   Feeling of Stress : Rather much  Social Connections: Moderately Integrated (07/12/2021)   Social Connection and Isolation Panel [NHANES]    Frequency of Communication with Friends and Family: Three times a week    Frequency of Social Gatherings with Friends and Family: Once a week    Attends Religious Services: 1 to 4 times per year    Active  Member of [employer] Organizations: No    Attends CArchivistMeetings: Never    Marital Status: Married  IHuman resources officerViolence: Not At Risk (07/12/2021)   Humiliation, Afraid, Rape, and Kick questionnaire    Fear of Current or Ex-Partner: No    Emotionally Abused: No    Physically Abused: No    Sexually Abused: No   Family History  Problem Relation Age of Onset   Alzheimer's disease Mother    Dementia Mother    Other Mother        twisted colon    Lung cancer Father    Heart disease Father        MI at age 73  Alcohol abuse Father    Stomach cancer Neg Hx    Colon cancer Neg Hx    Esophageal  cancer Neg Hx    Pancreatic cancer Neg Hx    Inflammatory bowel disease Neg Hx    Liver disease Neg Hx    Rectal cancer Neg Hx    I have reviewed her medical, social, and family history in detail and updated the electronic medical record as necessary.    PHYSICAL EXAMINATION  BP 138/80   Pulse (!) 55   Ht 5' 4.5" (1.638 m)   Wt 152 lb (68.9 kg)   SpO2 99%   BMI 25.69 kg/m  Wt Readings from Last 3 Encounters:  06/19/22 152 lb (68.9 kg)  05/21/22 148 lb (67.1 kg)  04/04/22 150 lb (68 kg)  GEN: NAD, appears stated age, doesn't appear chronically ill PSYCH: Cooperative, without pressured speech EYE: Conjunctivae pink, sclerae anicteric ENT: MMM, without oral ulcers, no erythema or exudates noted NECK: Supple CV: RR without R/Gs  RESP: CTAB posteriorly, without wheezing GI: NABS, soft, NT/ND, without rebound or guarding, no HSM appreciated GU: DRE shows MSK/EXT: _ edema, no palmar erythema SKIN: No jaundice, no spider angiomata, no concerning rashes NEURO:  Alert & Oriented x 3, no focal deficits, no evidence of asterixis   REVIEW OF DATA  I reviewed the following data at the time of this encounter:  GI Procedures and Studies  August 2023 EGD - No gross lesions in esophagus proximally. Multiple columns of Grade I and grade II esophageal varices with no bleeding  and no stigmata of recent bleeding. - Z-line irregular, 41 cm from the incisors. - Portal hypertensive gastropathy throughout. - Erosive gastropathy with no bleeding and no stigmata of recent bleeding. Biopsied. - No gross lesions in the duodenal bulb, in the first portion of the duodenum and in the second portion of the duodenum.  August 2023 colonoscopy - Hemorrhoids found on digital rectal exam. - The examined portion of the ileum was normal. - One 7 mm polyp in the sigmoid colon, removed with a cold snare. Resected and retrieved. - Friability with contact bleeding in the entire examined colon, but otherwise normal appearing mucosa throughout. - Diverticulosis in the recto-sigmoid colon, in the sigmoid colon, in the descending colon and in the transverse colon. - Very small rectal varices. - Non-bleeding non-thrombosed external and internal hemorrhoids.  Pathology FINAL MICROSCOPIC DIAGNOSIS:  A.   SIGMOID COLON, POLYP, BIOPSY:  Tubular adenoma  Negative for high-grade dysplasia and carcinoma  Margin free of adenomatous change  B.   GASTRIC BIOPSY:       Reactive gastropathy  Negative for H. pylori, intestinal metaplasia, dysplasia and carcinoma    Laboratory Studies  Reviewed those in epic and care everywhere  Imaging Studies  [place] 2023 CT abdomen pelvis IMPRESSION: Interval development of cirrhosis with evidence of portal venous hypertension with moderate splenomegaly and portosystemic collateralization via gastroesophageal and rectal varices. Superimposed mild hepatic steatosis. Moderate right coronary artery calcification. Moderate sigmoid diverticulosis. Bilateral L5 pars defects with progressive, grade 1 anterolisthesis L5-S1. Aortic Atherosclerosis (ICD10-I70.0).   ASSESSMENT  Ms. Steedley is a 71 y.o. female with a pmh significant for The patient is seen today for evaluation and management of:  1. Liver cirrhosis secondary to NASH ([place])   2. Esophageal varices  determined by endoscopy ([place])   3. Constipation, unspecified constipation type   4. Hx of adenomatous colonic polyps     The patient is Marcello Moores a based on June labs. Her MELD 3.0 score is 14 based on June labs.   PLAN  There are no diagnoses linked  to this encounter.   Orders Placed This Encounter  Procedures   CBC   Comp Met (CMET)   INR/PT   Hepatitis A Antibody, Total   Hepatitis B Surface AntiBODY   Hepatitis B core antibody, total    New Prescriptions   No medications on file   Modified Medications   No medications on file    Planned Follow Up Return in about 3 months (around 09/19/2022).   Total Time in Face-to-Face and in Coordination of Care for patient including independent/personal interpretation/review of prior testing, medical history, examination, medication adjustment, communicating results with the patient directly, and documentation within the EHR is ***.   Justice Britain, MD [facility]oscopy Office # [phone]

## 2022-06-20 LAB — HEPATITIS A ANTIBODY, TOTAL: Hepatitis A AB,Total: REACTIVE — AB

## 2022-06-20 LAB — HEPATITIS B CORE ANTIBODY, TOTAL: Hep B Core Total Ab: NONREACTIVE

## 2022-06-20 LAB — HEPATITIS B SURFACE ANTIBODY,QUALITATIVE: Hep B S Ab: NONREACTIVE

## 2022-06-21 DIAGNOSIS — Z86010 Personal history of colonic polyps: Secondary | ICD-10-CM | POA: Insufficient documentation

## 2022-06-21 DIAGNOSIS — K5900 Constipation, unspecified: Secondary | ICD-10-CM | POA: Insufficient documentation

## 2022-06-22 DIAGNOSIS — R630 Anorexia: Secondary | ICD-10-CM | POA: Insufficient documentation

## 2022-07-08 ENCOUNTER — Encounter: Payer: Self-pay | Admitting: Cardiovascular Disease

## 2022-07-08 ENCOUNTER — Ambulatory Visit: Payer: Medicare HMO | Attending: Cardiovascular Disease | Admitting: Cardiovascular Disease

## 2022-07-08 VITALS — BP 170/80 | HR 58 | Ht 64.5 in | Wt 150.6 lb

## 2022-07-08 DIAGNOSIS — I5032 Chronic diastolic (congestive) heart failure: Secondary | ICD-10-CM | POA: Diagnosis not present

## 2022-07-08 DIAGNOSIS — I350 Nonrheumatic aortic (valve) stenosis: Secondary | ICD-10-CM

## 2022-07-08 DIAGNOSIS — I2510 Atherosclerotic heart disease of native coronary artery without angina pectoris: Secondary | ICD-10-CM

## 2022-07-08 MED ORDER — POTASSIUM CHLORIDE CRYS ER 10 MEQ PO TBCR: 20.0000 meq | EXTENDED_RELEASE_TABLET | Freq: Every day | ORAL | 3 refills | Status: DC

## 2022-07-08 NOTE — Patient Instructions (Signed)
Medication Instructions:  No changes *If you need a refill on your cardiac medications before your next appointment, please call your pharmacy*   Lab Work: none If you have labs (blood work) drawn today and your tests are completely normal, you will receive your results only by: [place] (if you have MyChart) OR A paper copy in the mail If you have any lab test that is abnormal or we need to change your treatment, we will call you to review the results.   Testing/Procedures: none   Follow-Up: At [facility], you and your health needs are our priority.  As part of our continuing mission to provide you with exceptional heart care, we have created designated Provider Care Teams.  These Care Teams include your primary Cardiologist (physician) and Advanced Practice Providers (APPs -  Physician Assistants and Nurse Practitioners) who all work together to provide you with the care you need, when you need it.  We recommend signing up for the patient portal called "MyChart".  Sign up information is provided on this After Visit Summary.  MyChart is used to connect with patients for Virtual Visits (Telemedicine).  Patients are able to view lab/test results, encounter notes, upcoming appointments, etc.  Non-urgent messages can be sent to your provider as well.   To learn more about what you can do with MyChart, go to [email or web address].    Your next appointment:   12 month(s)  The format for your next appointment:   In Person  Provider:   [name], MD     Other Instructions   Important Information About Sugar

## 2022-07-08 NOTE — Progress Notes (Signed)
Chief Complaint  Patient presents with   Follow-up    Aortic stenosis   History of Present Illness: 71 yo female with history of CAD, DM, HTN, HLD, dementia, tobacco abuse, aortic stenosis s/p TAVR, esophageal varices and hepatic cirrhosis who is here today for cardiac follow up. I began following her in 2019 and at that time she had moderate AS. Her Aortic stenosed progressed over the next year. Cardiac catheterization on 10/07/18 and she was found to have severe three vessel CAD. I referred her to CT surgery to consider CABG with AVR. She was seen by Dr. Roxy Manns and was not felt to be a good candidate for surgical AVR and CABG due to the small size of her aortic root.  PCI of the LAD and Circumflex in July 2020. She underwent TAVR in September 2020 with placement of a 23 mm Medtronic Evolut Pro THV. Abdominal u/s ordered by GI (Dr. [name]) with findings consistent with hepatic cirrhosis. She is followed in the GI clinic with hepatic cirrhosis. Echo November 2021 with normal LV function and trivial leak of the AVR.   She is here today for follow up. The patient denies any chest pain, dyspnea, palpitations, lower extremity edema, orthopnea, PND, dizziness, near syncope or syncope.   Primary Care Physician: [name], MD  Past Medical History:  Diagnosis Date   Anxiety    Chronic upper GI bleeding    intestinal AVM's, portal gastropathy, esophageal varices on egd 04/2018   Coronary artery disease involving native coronary artery without angina pectoris    Dementia ([place]) 09/16/2016   Depression    DIVERTICULITIS OF COLON 06/14/2008   Qualifier: Diagnosis of  By: Gretta Cool RN, Malachy Mood     DM (diabetes mellitus), type 2 ([place]) 09/15/2016   Esophageal varices determined by endoscopy ([place])    Hyperlipidemia    Hypertension    Hypothyroidism    Incidental pulmonary nodule, greater than or equal to 52m 10/14/2018   LLL nodule needs f/u CT or PET-CT scan   Irritable bowel syndrome 04/06/2008    Centricity Description: IBS Qualifier: Diagnosis of  By: [name] (ADeborra Medina, [name]   Centricity Description: IRRITABLE BOWEL SYNDROME Qualifier: Diagnosis of  By: [name], [name]   Liver cirrhosis secondary to NASH ([place] 09/16/2016   NASH (nonalcoholic steatohepatitis)    Obesity    Portal hypertensive gastropathy ([place]    S/P TAVR (transcatheter aortic valve replacement) 04/20/2019   a. 04/20/19: s/p TAVR wtih a 281mMedtronic CoreValve Evolut Pro via the TF approach   Severe aortic stenosis    Splenomegaly 09/16/2016   Thrombocytopenia ([facility])    Thyroid disease    Tobacco abuse    Vitamin D deficiency     Past Surgical History:  Procedure Laterality Date   BIOPSY  04/04/2022   Procedure: BIOPSY;  Surgeon: MaIrving Copas MD;  [facility];  Service: Gastroenterology;;   CARDIAC CATHETERIZATION     COLONOSCOPY WITH PROPOFOL N/A 04/09/2018   Procedure: COLONOSCOPY WITH PROPOFOL;  Surgeon: JaMilus BanisterMD;  [facility];  Service: Endoscopy;  Laterality: N/A;   COLONOSCOPY WITH PROPOFOL N/A 04/04/2022   Procedure: COLONOSCOPY WITH PROPOFOL;  Surgeon: MaRush LandmarkaTelford Nab MD;  [facility];  Service: Gastroenterology;  Laterality: N/A;   CORONARY ANGIOPLASTY     CORONARY STENT INTERVENTION N/A 02/11/2019   Procedure: CORONARY STENT INTERVENTION;  Surgeon: McBurnell BlanksMD;  [facility];  Service: Cardiovascular;  Laterality: N/A;  Prox RCA Prox LAD   ESOPHAGOGASTRODUODENOSCOPY (EGD) WITH PROPOFOL N/A 04/09/2018   Procedure: ESOPHAGOGASTRODUODENOSCOPY (EGD) WITH PROPOFOL;  Surgeon: [name], MD;  [facility];  Service: Endoscopy;  Laterality: N/A;   ESOPHAGOGASTRODUODENOSCOPY (EGD) WITH PROPOFOL N/A 04/04/2022   Procedure: ESOPHAGOGASTRODUODENOSCOPY (EGD) WITH PROPOFOL;  Surgeon: Rush Landmark Telford Nab., MD;  [facility];  Service: Gastroenterology;  Laterality: N/A;   oopherectomy      OVARIAN CYST REMOVAL     POLYPECTOMY  04/09/2018   Procedure: POLYPECTOMY;  Surgeon: [name], MD;  [facility];  Service: Endoscopy;;   POLYPECTOMY  04/04/2022   Procedure: POLYPECTOMY;  Surgeon: Irving Copas., MD;  [facility];  Service: Gastroenterology;;   RIGHT/LEFT HEART CATH AND CORONARY ANGIOGRAPHY N/A 10/07/2018   Procedure: RIGHT/LEFT HEART CATH AND CORONARY ANGIOGRAPHY;  Surgeon: [name], MD;  Location: [facility];  Service: Cardiovascular;  Laterality: N/A;   TEE WITHOUT CARDIOVERSION N/A 04/20/2019   Procedure: TRANSESOPHAGEAL ECHOCARDIOGRAM (TEE);  Surgeon: [name], MD;  Location: [facility];  Service: Open Heart Surgery;  Laterality: N/A;   TRANSCATHETER AORTIC VALVE REPLACEMENT, TRANSFEMORAL N/A 04/20/2019   Procedure: TRANSCATHETER AORTIC VALVE REPLACEMENT, TRANSFEMORAL approach using 75m Evolut PRO+ medtronic valve;  Surgeon: MBurnell Blanks MD;  [place] [facility]  Service: Open Heart Surgery;  Laterality: N/A;    Current Outpatient Medications  Medication Sig Dispense Refill   ACCU-CHEK AVIVA PLUS test strip      ACCU-CHEK SOFTCLIX LANCETS lancets      atorvastatin (LIPITOR) 20 MG tablet TAKE 1 TABLET EVERY DAY 90 tablet 3   Blood Glucose Monitoring Suppl (ACCU-CHEK AVIVA PLUS) w/Device KIT      FEROSUL 325 (65 Fe) MG tablet TAKE 1 TABLET EVERY DAY 90 tablet 1   FLUoxetine (PROZAC) 40 MG capsule Take 1 capsule (40 mg total) by mouth daily. 90 capsule 3   furosemide (LASIX) 20 MG tablet TAKE 1 TABLET EVERY DAY 90 tablet 1   levothyroxine (SYNTHROID) 88 MCG tablet TAKE 1 TABLET EVERY DAY 90 tablet 10   losartan (COZAAR) 100 MG tablet TAKE 1 TABLET EVERY DAY 90 tablet 1   nadolol (CORGARD) 20 MG tablet Take 0.5 tablets (10 mg total) by mouth daily. 30 tablet 12   pantoprazole (PROTONIX) 40 MG tablet TAKE 1 TABLET EVERY DAY (Patient taking differently: Take 40 mg by mouth at bedtime.) 90 tablet 3    traZODone (DESYREL) 50 MG tablet TAKE 1 TABLET BY MOUTH EVERY DAY AT BEDTIME AS NEEDED 90 tablet 2   zolpidem (AMBIEN) 5 MG tablet Take 5 mg by mouth at bedtime as needed for sleep.     potassium chloride (KLOR-CON M) 10 MEQ tablet Take 2 tablets (20 mEq total) by mouth daily. 180 tablet 3   Current Facility-Administered Medications  Medication Dose Route Frequency Provider Last Rate Last Admin   cyanocobalamin ((VITAMIN B-12)) injection 1,000 mcg  1,000 mcg Subcutaneous Q30 days PSusy Frizzle MD   1,000 mcg at 09/08/17 1408    Allergies  Allergen Reactions   Prednisone Itching    Heart rate increased   Celecoxib Rash   Macrodantin [Nitrofurantoin Macrocrystal] Rash    Social History   Socioeconomic History   Marital status: Married    Spouse name: Not on file   Number of children: 1   Years of education: Not on file   Highest education level: Not on file  Occupational History   Not on file  Tobacco  Use   Smoking status: Every Day    Packs/day: 0.50    Types: Cigarettes   Smokeless tobacco: Never  Vaping Use   Vaping Use: Never used  Substance and Sexual Activity   Alcohol use: No   Drug use: Not Currently    Types: Benzodiazepines   Sexual activity: Not Currently  Other Topics Concern   Not on file  Social History Narrative   Married x 53 years in 2023.   1 son   2 grandsons   Social Determinants of Health   Financial Resource Strain: Low Risk  (07/12/2021)   Overall Financial Resource Strain (CARDIA)    Difficulty of Paying Living Expenses: Not hard at all  Food Insecurity: No Food Insecurity (07/12/2021)   Hunger Vital Sign    Worried About Running Out of Food in the Last Year: Never true    Ran Out of Food in the Last Year: Never true  Transportation Needs: No Transportation Needs (07/12/2021)   PRAPARE - Hydrologist (Medical): No    Lack of Transportation (Non-Medical): No  Physical Activity: Inactive (07/12/2021)    Exercise Vital Sign    Days of Exercise per Week: 0 days    Minutes of Exercise per Session: 0 min  Stress: Stress Concern Present (07/12/2021)   [place]    Feeling of Stress : Rather much  Social Connections: Moderately Integrated (07/12/2021)   Social Connection and Isolation Panel [NHANES]    Frequency of Communication with Friends and Family: Three times a week    Frequency of Social Gatherings with Friends and Family: Once a week    Attends Religious Services: 1 to 4 times per year    Active Member of [employer] or Organizations: No    Attends Archivist Meetings: Never    Marital Status: Married  Human resources officer Violence: Not At Risk (07/12/2021)   Humiliation, Afraid, Rape, and Kick questionnaire    Fear of Current or Ex-Partner: No    Emotionally Abused: No    Physically Abused: No    Sexually Abused: No    Family History  Problem Relation Age of Onset   Alzheimer's disease Mother    Dementia Mother    Other Mother        twisted colon    Lung cancer Father    Heart disease Father        MI at age 41   Alcohol abuse Father    Stomach cancer Neg Hx    Colon cancer Neg Hx    Esophageal cancer Neg Hx    Pancreatic cancer Neg Hx    Inflammatory bowel disease Neg Hx    Liver disease Neg Hx    Rectal cancer Neg Hx     Review of Systems:  As stated in the HPI and otherwise negative.   BP (!) 170/80   Pulse (!) 58   Ht 5' 4.5" (1.638 m)   Wt 150 lb 9.6 oz (68.3 kg)   SpO2 97%   BMI 25.45 kg/m   Physical Examination:  General: Well developed, well nourished, NAD  HEENT: OP clear, mucus membranes moist  SKIN: warm, dry. No rashes. Neuro: No focal deficits  Musculoskeletal: Muscle strength 5/5 all ext  Psychiatric: Mood and affect normal  Neck: No JVD, no carotid bruits, no thyromegaly, no lymphadenopathy.  Lungs:Clear bilaterally, no wheezes, rhonci, crackles Cardiovascular: Regular rate  and rhythm. No murmurs, gallops  or rubs. Abdomen:Soft. Bowel sounds present. Non-tender.  Extremities: No lower extremity edema. Pulses are 2 + in the bilateral DP/PT.  Echo 06/07/20:  1. Left ventricular ejection fraction, by estimation, is 60 to 65%. The  left ventricle has normal function. The left ventricle has no regional  wall motion abnormalities. There is moderate concentric left ventricular  hypertrophy. Left ventricular  diastolic parameters are consistent with Grade I diastolic dysfunction  (impaired relaxation). Elevated left ventricular end-diastolic pressure.   2. Right ventricular systolic function is normal. The right ventricular  size is normal. There is normal pulmonary artery systolic pressure.   3. Left atrial size was mildly dilated.   4. The mitral valve is normal in structure. Trivial mitral valve  regurgitation. No evidence of mitral stenosis.   5. Trivial perivalvular aortic regurgitation. TAVR aortic valve gradients  are unchanged from 05/2019. The aortic valve has been repaired/replaced.  Aortic valve regurgitation is trivial. There is a 23 mm CoreValve-Evolut  Pro prosthetic (TAVR) valve  present in the aortic position. Procedure Date: 04/20/2019.   6. The inferior vena cava is normal in size with greater than 50%  respiratory variability, suggesting right atrial pressure of 3 mmHg.   EKG:  EKG is ordered today. The ekg ordered today demonstrates sinus brady, rate 58 bpm. LBBB  Recent Labs: 11/27/2021: TSH 1.77 06/19/2022: ALT 15; BUN 7; Creatinine, Ser 0.54; Hemoglobin 12.5; Platelets 63.0; Potassium 3.3; Sodium 142   Lipid Panel    Component Value Date/Time   CHOL 160 06/01/2021 1307   TRIG 106 06/01/2021 1307   HDL 57 06/01/2021 1307   CHOLHDL 2.8 06/01/2021 1307   VLDL 20 03/26/2017 0908   LDLCALC 83 06/01/2021 1307     Wt Readings from Last 3 Encounters:  07/08/22 150 lb 9.6 oz (68.3 kg)  06/19/22 152 lb (68.9 kg)  05/21/22 148 lb (67.1 kg)     Assessment and Plan:   1. Severe Aortic stenosis:  s/p TAVR and doing well. AVR working well by echo in November 2021.   2. CAD without angina: No chest pain. Continue statin. ASA stopped due to liver disease.   3. Chronic diastolic CHF: No volume overload on exam. Continue Lasix 20 mg daily  4. HTN: BP controlled at home. She has not taken her Losartan today.   Labs/ tests ordered today include:   Orders Placed This Encounter  Procedures   EKG 12-Lead   Disposition:   F/U with me in one year.    Signed, [name], MD 07/08/2022 3:03 PM    [facility] [place], [place], [place]  [address] Phone: [phone]; Fax: [phone]

## 2022-07-11 ENCOUNTER — Other Ambulatory Visit: Payer: Self-pay | Admitting: Family Medicine

## 2022-07-11 ENCOUNTER — Telehealth: Payer: Self-pay | Admitting: Family Medicine

## 2022-07-11 NOTE — Telephone Encounter (Signed)
Last OV 05/21/22 .  Requested Prescriptions  Pending Prescriptions Disp Refills   furosemide (LASIX) 20 MG tablet [Pharmacy Med Name: FUROSEMIDE 20 MG Tablet] 90 tablet 2    Sig: TAKE 1 TABLET EVERY DAY     Cardiovascular:  Diuretics - Loop Failed - 07/11/2022 11:39 AM      Failed - K in normal range and within 180 days    Potassium  Date Value Ref Range Status  06/19/2022 3.3 (L) 3.5 - 5.1 mEq/L Final         Failed - Mg Level in normal range and within 180 days    Magnesium  Date Value Ref Range Status  04/21/2019 1.7 1.7 - 2.4 mg/dL Final    Comment:    Performed at [facility], [place] [address] [place], [place] [address]         Failed - Last BP in normal range    BP Readings from Last 1 Encounters:  07/08/22 (!) 170/80         Failed - Valid encounter within last 6 months    Recent Outpatient Visits           7 months ago Hematuria, unspecified type   [place] [name], MD   1 year ago Type 2 diabetes mellitus with other specified complication, without long-term current use of insulin ([place])   [place] Pickard, [name], MD   1 year ago Situational anxiety   [place] Pickard, [name], MD   1 year ago Lower abdominal pain   [place] [name], [name], MD   1 year ago Type 2 diabetes mellitus with other specified complication, without long-term current use of insulin ([place])   [place] Pickard, [name], MD              Passed - Ca in normal range and within 180 days    Calcium  Date Value Ref Range Status  06/19/2022 8.8 8.4 - 10.5 mg/dL Final   Calcium, Ion  Date Value Ref Range Status  04/20/2019 1.26 1.15 - 1.40 mmol/L Final         Passed - Na in normal range and within 180 days    Sodium  Date Value Ref Range Status  06/19/2022 142 135 - 145 mEq/L Final  04/28/2019 141 134 - 144 mmol/L Final         Passed - Cr in normal range and  within 180 days    Creat  Date Value Ref Range Status  11/27/2021 0.75 0.60 - 1.00 mg/dL Final   Creatinine, Ser  Date Value Ref Range Status  06/19/2022 0.54 0.40 - 1.20 mg/dL Final         Passed - Cl in normal range and within 180 days    Chloride  Date Value Ref Range Status  06/19/2022 110 96 - 112 mEq/L Final

## 2022-07-11 NOTE — Telephone Encounter (Signed)
Patient called to request medication to manage pain from rectal bleeding and lower abdominal pain.  Pharmacy confirmed as   [id number] [place], [id number]: [phone] Fax: [phone] #: [id number]   Please advise at [phone].

## 2022-07-11 NOTE — Telephone Encounter (Signed)
Appointment scheduled for Monday.

## 2022-07-15 ENCOUNTER — Ambulatory Visit: Payer: Medicare HMO | Admitting: Family Medicine

## 2022-07-18 ENCOUNTER — Ambulatory Visit (INDEPENDENT_AMBULATORY_CARE_PROVIDER_SITE_OTHER): Payer: Medicare HMO

## 2022-07-18 VITALS — Ht 65.0 in | Wt 145.0 lb

## 2022-07-18 DIAGNOSIS — Z0000 Encounter for general adult medical examination without abnormal findings: Secondary | ICD-10-CM

## 2022-07-18 NOTE — Progress Notes (Signed)
Subjective:   Kelly [name] is a 71 y.o. female who presents for Medicare Annual (Subsequent) preventive examination.  I connected with  Amethyst S Melchior on 07/18/22 by a audio enabled telemedicine application and verified that I am speaking with the correct person using two identifiers.  Patient Location: Home  Provider Location: Office/Clinic  I discussed the limitations of evaluation and management by telemedicine. The patient expressed understanding and agreed to proceed.  Review of Systems     Cardiac Risk Factors include: advanced age (>79mn, >>21women);dyslipidemia;hypertension;sedentary lifestyle     Objective:    Today's Vitals   07/18/22 1329  Weight: 145 lb (65.8 kg)  Height: 5' 5" (1.651 m)   Body mass index is 24.13 kg/m.     07/18/2022    1:38 PM 04/04/2022    2:47 PM 07/12/2021   11:23 AM 06/01/2020   11:12 AM 04/20/2019    7:00 PM 04/16/2019   11:56 AM 02/26/2019    1:24 PM  Advanced Directives  Does Patient Have a Medical Advance Directive? No No Yes No Yes Yes No  Type of AScientist, physiologicalof AOakland AcresLiving will  HChestertonLiving will [place]omah Village[place] will   Does patient want to make changes to medical advance directive?     No - Patient declined    Copy of [place] Chart?   No - copy requested  No - copy requested No - copy requested   Would patient like information on creating a medical advance directive? Yes (MAU/Ambulatory/Procedural Areas - Information given)   No - Patient declined   No - Patient declined    Current Medications (verified) Outpatient Encounter Medications as of 07/18/2022  Medication Sig   ACCU-CHEK AVIVA PLUS test strip    ACCU-CHEK SOFTCLIX LANCETS lancets    atorvastatin (LIPITOR) 20 MG tablet TAKE 1 TABLET EVERY DAY   Blood Glucose Monitoring Suppl (ACCU-CHEK AVIVA PLUS) w/Device KIT    FEROSUL 325 (65 Fe) MG tablet TAKE 1 TABLET EVERY DAY    FLUoxetine (PROZAC) 40 MG capsule Take 1 capsule (40 mg total) by mouth daily.   furosemide (LASIX) 20 MG tablet TAKE 1 TABLET EVERY DAY   levothyroxine (SYNTHROID) 88 MCG tablet TAKE 1 TABLET EVERY DAY   losartan (COZAAR) 100 MG tablet TAKE 1 TABLET EVERY DAY   nadolol (CORGARD) 20 MG tablet Take 0.5 tablets (10 mg total) by mouth daily.   pantoprazole (PROTONIX) 40 MG tablet TAKE 1 TABLET EVERY DAY (Patient taking differently: Take 40 mg by mouth at bedtime.)   potassium chloride (KLOR-CON M) 10 MEQ tablet Take 2 tablets (20 mEq total) by mouth daily.   traZODone (DESYREL) 50 MG tablet TAKE 1 TABLET BY MOUTH EVERY DAY AT BEDTIME AS NEEDED   [DISCONTINUED] zolpidem (AMBIEN) 5 MG tablet Take 5 mg by mouth at bedtime as needed for sleep.   Facility-Administered Encounter Medications as of 07/18/2022  Medication   cyanocobalamin ((VITAMIN B-12)) injection 1,000 mcg    Allergies (verified) Prednisone, Celecoxib, and Macrodantin [nitrofurantoin macrocrystal]   History: Past Medical History:  Diagnosis Date   Anxiety    Chronic upper GI bleeding    intestinal AVM's, portal gastropathy, esophageal varices on egd 04/2018   Coronary artery disease involving native coronary artery without angina pectoris    Dementia ([place] 09/16/2016   Depression    DIVERTICULITIS OF COLON 06/14/2008   Qualifier: Diagnosis of  By: LMat Carne  DM (diabetes mellitus), type 2 ([place]) 09/15/2016   Esophageal varices determined by endoscopy ([facility])    Hyperlipidemia    Hypertension    Hypothyroidism    Incidental pulmonary nodule, greater than or equal to 64m 10/14/2018   LLL nodule needs f/u CT or PET-CT scan   Irritable bowel syndrome 04/06/2008   Centricity Description: IBS Qualifier: Diagnosis of  By: [name] (ADeborra Medina, [name]   Centricity Description: IRRITABLE BOWEL SYNDROME Qualifier: Diagnosis of  By: [name], [name]   Liver cirrhosis secondary to NASH ([place] 09/16/2016   NASH (nonalcoholic  steatohepatitis)    Obesity    Portal hypertensive gastropathy ([place]    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 04/20/19: s/p TAVR wtih a 216mMedtronic CoreValve Evolut Pro via the TF approach   Severe aortic stenosis    Splenomegaly 09/16/2016   Thrombocytopenia (HCC)    Thyroid disease    Tobacco abuse    Vitamin D deficiency    Past Surgical History:  Procedure Laterality Date   BIOPSY  04/04/2022   Procedure: BIOPSY;  Surgeon: MaIrving Copas MD;  [facility];  Service: Gastroenterology;;   CARDIAC CATHETERIZATION     COLONOSCOPY WITH PROPOFOL N/A 04/09/2018   Procedure: COLONOSCOPY WITH PROPOFOL;  Surgeon: JaMilus BanisterMD;  [facility];  Service: Endoscopy;  Laterality: N/A;   COLONOSCOPY WITH PROPOFOL N/A 04/04/2022   Procedure: COLONOSCOPY WITH PROPOFOL;  Surgeon: MaRush LandmarkaTelford Nab MD;  [facility];  Service: Gastroenterology;  Laterality: N/A;   CORONARY ANGIOPLASTY     CORONARY STENT INTERVENTION N/A 02/11/2019   Procedure: CORONARY STENT INTERVENTION;  Surgeon: McBurnell BlanksMD;  [facility];  Service: Cardiovascular;  Laterality: N/A;  Prox RCA Prox LAD   ESOPHAGOGASTRODUODENOSCOPY (EGD) WITH PROPOFOL N/A 04/09/2018   Procedure: ESOPHAGOGASTRODUODENOSCOPY (EGD) WITH PROPOFOL;  Surgeon: JaMilus BanisterMD;  [facility];  Service: Endoscopy;  Laterality: N/A;   ESOPHAGOGASTRODUODENOSCOPY (EGD) WITH PROPOFOL N/A 04/04/2022   Procedure: ESOPHAGOGASTRODUODENOSCOPY (EGD) WITH PROPOFOL;  Surgeon: MaRush LandmarkaTelford Nab MD;  [facility];  Service: Gastroenterology;  Laterality: N/A;   oopherectomy     OVARIAN CYST REMOVAL     POLYPECTOMY  04/09/2018   Procedure: POLYPECTOMY;  Surgeon: JaMilus BanisterMD;  [facility];  Service: Endoscopy;;   POLYPECTOMY  04/04/2022   Procedure: POLYPECTOMY;  Surgeon: MaIrving Copas MD;  [facility];  Service: Gastroenterology;;   RIGHT/LEFT HEART CATH AND CORONARY ANGIOGRAPHY N/A 10/07/2018   Procedure: RIGHT/LEFT HEART CATH AND CORONARY ANGIOGRAPHY;  Surgeon: McBurnell BlanksMD;  [facility];  Service: Cardiovascular;  Laterality: N/A;   TEE WITHOUT CARDIOVERSION N/A 04/20/2019   Procedure: TRANSESOPHAGEAL ECHOCARDIOGRAM (TEE);  Surgeon: McBurnell BlanksMD;  [facility] Service: Open Heart Surgery;  Laterality: N/A;   TRANSCATHETER AORTIC VALVE REPLACEMENT, TRANSFEMORAL N/A 04/20/2019   Procedure: TRANSCATHETER AORTIC VALVE REPLACEMENT, TRANSFEMORAL approach using 2331mvolut PRO+ medtronic valve;  Surgeon: McABurnell BlanksD;  [facility];  Laterality: N/A;   Family History  Problem Relation Age of Onset   Alzheimer's disease Mother    Dementia Mother    Other Mother        twisted colon    Lung cancer Father    Heart disease Father        MI at age 8 19Alcohol abuse Father  Stomach cancer Neg Hx    Colon cancer Neg Hx    Esophageal cancer Neg Hx    Pancreatic cancer Neg Hx    Inflammatory bowel disease Neg Hx    Liver disease Neg Hx    Rectal cancer Neg Hx    Social History   Socioeconomic History   Marital status: Married    Spouse name: Not on file   Number of children: 1   Years of education: Not on file   Highest education level: Not on file  Occupational History   Not on file  Tobacco Use   Smoking status: Every Day    Packs/day: 0.50    Types: Cigarettes   Smokeless tobacco: Never  Vaping Use   Vaping Use: Never used  Substance and Sexual Activity   Alcohol use: No   Drug use: Not Currently    Types: Benzodiazepines   Sexual activity: Not Currently  Other Topics Concern   Not on file  Social History Narrative   Married x 53 years in 2023.   1 son   2 grandsons   Social Determinants of Health   Financial Resource Strain: Low Risk  (07/18/2022)   Overall  Financial Resource Strain (CARDIA)    Difficulty of Paying Living Expenses: Not hard at all  Food Insecurity: No Food Insecurity (07/18/2022)   Hunger Vital Sign    Worried About Running Out of Food in the Last Year: Never true    Ran Out of Food in the Last Year: Never true  Transportation Needs: No Transportation Needs (07/18/2022)   PRAPARE - Hydrologist (Medical): No    Lack of Transportation (Non-Medical): No  Physical Activity: Inactive (07/18/2022)   Exercise Vital Sign    Days of Exercise per Week: 0 days    Minutes of Exercise per Session: 0 min  Stress: Stress Concern Present (07/18/2022)   [place]    Feeling of Stress : To some extent  Social Connections: Moderately Integrated (07/18/2022)   Social Connection and Isolation Panel [NHANES]    Frequency of Communication with Friends and Family: Three times a week    Frequency of Social Gatherings with Friends and Family: Once a week    Attends Religious Services: 1 to 4 times per year    Active Member of [employer] or Organizations: No    Attends Music therapist: Never    Marital Status: Married    Tobacco Counseling Ready to quit: Not Answered Counseling given: Not Answered   Clinical Intake:  Pre-visit preparation completed: Yes  Pain : No/denies pain     Diabetes: No  How often do you need to have someone help you when you read instructions, pamphlets, or other written materials from your doctor or pharmacy?: 3 - Sometimes  Diabetic?No   Interpreter Needed?: No  Information entered by :: Denman George LPN   Activities of Daily Living    07/18/2022    1:38 PM  In your present state of health, do you have any difficulty performing the following activities:  Hearing? 0  Vision? 0  Difficulty concentrating or making decisions? 1  Walking or climbing stairs? 0  Dressing or bathing? 0  Doing  errands, shopping? 1  Preparing Food and eating ? N  Using the Toilet? N  In the past six months, have you accidently leaked urine? N  Do you have problems with loss of bowel control? N  Managing your Medications? Y  Managing your Finances? N  Housekeeping or managing your Housekeeping? N    Patient Care Team: [name], MD as PCP - General (Family Medicine) [name], MD as PCP - Cardiology (Cardiology) [name], [facility] as Pharmacist (Pharmacist)  Indicate any recent Medical Services you Winzer have received from other than [facility] providers in the past year (date Marty be approximate).     Assessment:   This is a routine wellness examination for [place].  Hearing/Vision screen Hearing Screening - Comments:: No concerns noted   Vision Screening - Comments:: No vision concerns at this time    Dietary issues and exercise activities discussed: Current Exercise Habits: The patient does not participate in regular exercise at present   Goals Addressed   None   Depression Screen    07/18/2022    1:39 PM 05/21/2022   11:32 AM 07/12/2021   11:13 AM 09/13/2020   11:54 AM 07/27/2020   12:24 PM 01/24/2020    2:16 PM 02/23/2018   11:20 AM  PHQ 2/9 Scores  PHQ - 2 Score _0 0 6 0 5  PHQ- 9 Score _1 Fall Risk    07/18/2022    1:32 PM 07/12/2021   11:24 AM 09/13/2020   11:54 AM 01/24/2020    2:16 PM 02/23/2018   11:20 AM  Fall Risk   Falls in the past year? 0 1 0 0 No  Number falls in past yr: 0 0 0 0   Injury with Fall? 0 0 0 0   Risk for fall due to : No Fall Risks Impaired balance/gait;Impaired mobility     Follow up Falls evaluation completed;Education provided;Falls prevention discussed Falls prevention discussed  Falls evaluation completed     FALL RISK PREVENTION PERTAINING TO THE HOME:  Any stairs in or around the home? No  If so, are there any without handrails? No  Home free of loose throw rugs in walkways, pet beds, electrical  cords, etc? Yes  Adequate lighting in your home to reduce risk of falls? Yes   ASSISTIVE DEVICES UTILIZED TO PREVENT FALLS:  Life alert? No  Use of a cane, walker or w/c? No  Grab bars in the bathroom? Yes  Shower chair or bench in shower? No  Elevated toilet seat or a handicapped toilet? Yes   TIMED UP AND GO:  Was the test performed? No .  Length of time to ambulate 10 feet: telephonic visit    Cognitive Function:        07/18/2022    1:38 PM 07/12/2021   11:34 AM  6CIT Screen  What Year? 0 points 0 points  What month? 3 points 0 points  What time? 0 points 0 points  Count back from 20 0 points 0 points  Months in reverse 2 points 0 points  Repeat phrase 6 points 0 points  Total Score 11 points 0 points    Immunizations Immunization History  Administered Date(s) Administered   Fluad Quad(high Dose 65+) 04/21/2019, 04/28/2020   Influenza, High Dose Seasonal PF 06/02/2017, 04/15/2018   Influenza-Unspecified 06/05/2016   PFIZER(Purple Top)SARS-COV-2 Vaccination 07/10/2020   Pneumococcal Polysaccharide-23 04/21/2019    TDAP status: Due, Education has been provided regarding the importance of this vaccine. Advised Artley receive this vaccine at local pharmacy or Health Dept. Aware to provide a copy of the vaccination record if obtained from local pharmacy or Health Dept. Verbalized  acceptance and understanding.  Flu Vaccine status: Due, Education has been provided regarding the importance of this vaccine. Advised Patnaude receive this vaccine at local pharmacy or Health Dept. Aware to provide a copy of the vaccination record if obtained from local pharmacy or Health Dept. Verbalized acceptance and understanding.  Pneumococcal vaccine status: Due, Education has been provided regarding the importance of this vaccine. Advised Fendley receive this vaccine at local pharmacy or Health Dept. Aware to provide a copy of the vaccination record if obtained from local pharmacy or Health Dept.  Verbalized acceptance and understanding.  Covid-19 vaccine status: Information provided on how to obtain vaccines.   Qualifies for Shingles Vaccine? Yes   Zostavax completed No   Shingrix Completed?: No.    Education has been provided regarding the importance of this vaccine. Patient has been advised to call insurance company to determine out of pocket expense if they have not yet received this vaccine. Advised Tsan also receive vaccine at local pharmacy or Health Dept. Verbalized acceptance and understanding.  Screening Tests Health Maintenance  Topic Date Due   OPHTHALMOLOGY EXAM  Never done   Diabetic kidney evaluation - Urine ACR  Never done   DTaP/Tdap/Td (1 - Tdap) Never done   Zoster Vaccines- Shingrix (1 of 2) Never done   DEXA SCAN  Never done   MAMMOGRAM  06/14/2019   Pneumonia Vaccine 74+ Years old (2 - PCV) 04/20/2020   INFLUENZA VACCINE  03/05/2022   COVID-19 Vaccine (2 - 2023-24 season) 04/05/2022   FOOT EXAM  05/11/2022   HEMOGLOBIN A1C  05/29/2022   Diabetic kidney evaluation - eGFR measurement  06/20/2023   Medicare Annual Wellness (AWV)  07/19/2023   COLONOSCOPY (Pts 45-61yr Insurance coverage will need to be confirmed)  04/04/2032   Hepatitis C Screening  Completed   HPV VACCINES  Aged Out    Health Maintenance  Health Maintenance Due  Topic Date Due   OPHTHALMOLOGY EXAM  Never done   Diabetic kidney evaluation - Urine ACR  Never done   DTaP/Tdap/Td (1 - Tdap) Never done   Zoster Vaccines- Shingrix (1 of 2) Never done   DEXA SCAN  Never done   MAMMOGRAM  06/14/2019   Pneumonia Vaccine 73 Years old (2 - PCV) 04/20/2020   INFLUENZA VACCINE  03/05/2022   COVID-19 Vaccine (2 - 2023-24 season) 04/05/2022   FOOT EXAM  05/11/2022   HEMOGLOBIN A1C  05/29/2022    Colorectal cancer screening: Type of screening: Colonoscopy. Completed 04/04/22. Repeat every 10 years  Mammogram status: Completed 06/13/17. Repeat every year (Patient wants to postpone for  now)  Bone Density status: Ordered (Patient wants to postpone for now). Pt provided with contact info and advised to call to schedule appt.  Lung Cancer Screening: (Low Dose CT Chest recommended if Age 73-80years, 30 pack-year currently smoking OR have quit w/in 15years.) does not qualify.   Lung Cancer Screening Referral: n/a  Additional Screening:  Hepatitis C Screening: does qualify; Completed 02/28/09  Vision Screening: Recommended annual ophthalmology exams for early detection of glaucoma and other disorders of the eye. Is the patient up to date with their annual eye exam?  No  Who is the provider or what is the name of the office in which the patient attends annual eye exams? Plans to establish with new provider in the upcoming year If pt is not established with a provider, would they like to be referred to a provider to establish care? No .   Dental  Screening: Recommended annual dental exams for proper oral hygiene  Community Resource Referral / Chronic Care Management: CRR required this visit?  No   CCM required this visit?  No      Plan:     I have personally reviewed and noted the following in the patient's chart:   Medical and social history Use of alcohol, tobacco or illicit drugs  Current medications and supplements including opioid prescriptions. Patient is not currently taking opioid prescriptions. Functional ability and status Nutritional status Physical activity Advanced directives List of other physicians Hospitalizations, surgeries, and ER visits in previous 12 months Vitals Screenings to include cognitive, depression, and falls Referrals and appointments  In addition, I have reviewed and discussed with patient certain preventive protocols, quality metrics, and best practice recommendations. A written personalized care plan for preventive services as well as general preventive health recommendations were provided to patient.     Denman George  [place], [place]   02/72/[id number]   Nurse Notes: Patient asking about advice for something to help with insomnia.  Will route as a telephone message.

## 2022-07-18 NOTE — Patient Instructions (Signed)
Ms. Kelly [name] , Thank you for taking time to come for your Medicare Wellness Visit. I appreciate your ongoing commitment to your health goals. Please review the following plan we discussed and let me know if I can assist you in the future.   These are the goals we discussed:  Goals      Have 3 meals a day     Maintain healthy diet.     Monitor and Manage My Blood Sugar-Diabetes Type 2     Timeframe:  Long-Range Goal Priority:  High Start Date:   08/24/21                          Expected End Date: 02/24/22                      Follow Up Date 11/25/21    - check blood sugar at prescribed times - check blood sugar if I feel it is too high or too low - enter blood sugar readings and medication or insulin into daily log    Why is this important?   Checking your blood sugar at home helps to keep it from getting very high or very low.  Writing the results in a diary or log helps the doctor know how to care for you.  Your blood sugar log should have the time, date and the results.  Also, write down the amount of insulin or other medicine that you take.  Other information, like what you ate, exercise done and how you were feeling, will also be helpful.     Notes:         This is a list of the screening recommended for you and due dates:  Health Maintenance  Topic Date Due   Eye exam for diabetics  Never done   Yearly kidney health urinalysis for diabetes  Never done   DTaP/Tdap/Td vaccine (1 - Tdap) Never done   Zoster (Shingles) Vaccine (1 of 2) Never done   DEXA scan (bone density measurement)  Never done   Mammogram  06/14/2019   Pneumonia Vaccine (2 - PCV) 04/20/2020   Flu Shot  03/05/2022   COVID-19 Vaccine (2 - 2023-24 season) 04/05/2022   Complete foot exam   05/11/2022   Hemoglobin A1C  05/29/2022   Yearly kidney function blood test for diabetes  06/20/2023   Medicare Annual Wellness Visit  07/19/2023   Colon Cancer Screening  04/04/2032   Hepatitis C Screening: USPSTF  Recommendation to screen - Ages 18-79 yo.  Completed   HPV Vaccine  Aged Out    Advanced directives: Advance directive discussed with you today. I have provided a copy for you to complete at home and have notarized. Once this is complete please bring a copy in to our office so we can scan it into your chart.   Conditions/risks identified: Aim for 30 minutes of exercise or brisk walking, 6-8 glasses of water, and 5 servings of fruits and vegetables each day.   Next appointment: Follow up in one year for your annual wellness visit    Preventive Care 65 Years and Older, Female Preventive care refers to lifestyle choices and visits with your health care provider that can promote health and wellness. What does preventive care include? A yearly physical exam. This is also called an annual well check. Dental exams once or twice a year. Routine eye exams. Ask your health care provider how often you should  have your eyes checked. Personal lifestyle choices, including: Daily care of your teeth and gums. Regular physical activity. Eating a healthy diet. Avoiding tobacco and drug use. Limiting alcohol use. Practicing safe sex. Taking low-dose aspirin every day. Taking vitamin and mineral supplements as recommended by your health care provider. What happens during an annual well check? The services and screenings done by your health care provider during your annual well check will depend on your age, overall health, lifestyle risk factors, and family history of disease. Counseling  Your health care provider Philemon ask you questions about your: Alcohol use. Tobacco use. Drug use. Emotional well-being. Home and relationship well-being. Sexual activity. Eating habits. History of falls. Memory and ability to understand (cognition). Work and work Statistician. Reproductive health. Screening  You [place] have the following tests or measurements: Height, weight, and BMI. Blood pressure. Lipid and  cholesterol levels. These Guiffre be checked every 5 years, or more frequently if you are over 10 years old. Skin check. Lung cancer screening. You Heindel have this screening every year starting at age 62 if you have a 30-pack-year history of smoking and currently smoke or have quit within the past 15 years. Fecal occult blood test (FOBT) of the stool. You Tolles have this test every year starting at age 82. Flexible sigmoidoscopy or colonoscopy. You Murtaugh have a sigmoidoscopy every 5 years or a colonoscopy every 10 years starting at age 62. Hepatitis C blood test. Hepatitis B blood test. Sexually transmitted disease (STD) testing. Diabetes screening. This is done by checking your blood sugar (glucose) after you have not eaten for a while (fasting). You Huckeba have this done every 1-3 years. Bone density scan. This is done to screen for osteoporosis. You Kral have this done starting at age 106. Mammogram. This Perfetti be done every 1-2 years. Talk to your health care provider about how often you should have regular mammograms. Talk with your health care provider about your test results, treatment options, and if necessary, the need for more tests. Vaccines  Your health care provider Boesch recommend certain vaccines, such as: Influenza vaccine. This is recommended every year. Tetanus, diphtheria, and acellular pertussis (Tdap, Td) vaccine. You [place]enbring need a Td booster every 10 years. Zoster vaccine. You Rothermel need this after age 68. Pneumococcal 13-valent conjugate (PCV13) vaccine. One dose is recommended after age 55. Pneumococcal polysaccharide (PPSV23) vaccine. One dose is recommended after age 62. Talk to your health care provider about which screenings and vaccines you need and how often you need them. This information is not intended to replace advice given to you by your health care provider. Make sure you discuss any questions you have with your health care provider. Document Released: 08/18/2015 Document Revised:  04/10/2016 Document Reviewed: 05/23/2015 Elsevier Interactive Patient Education  2017 [place] Prevention in the Home Falls can cause injuries. They can happen to people of all ages. There are many things you can do to make your home safe and to help prevent falls. What can I do on the outside of my home? Regularly fix the edges of walkways and driveways and fix any cracks. Remove anything that might make you trip as you walk through a door, such as a raised step or threshold. Trim any bushes or trees on the path to your home. Use bright outdoor lighting. Clear any walking paths of anything that might make someone trip, such as rocks or tools. Regularly check to see if handrails are loose or broken. Make sure  that both sides of any steps have handrails. Any raised decks and porches should have guardrails on the edges. Have any leaves, snow, or ice cleared regularly. Use sand or salt on walking paths during winter. Clean up any spills in your garage right away. This includes oil or grease spills. What can I do in the bathroom? Use night lights. Install grab bars by the toilet and in the tub and shower. Do not use towel bars as grab bars. Use non-skid mats or decals in the tub or shower. If you need to sit down in the shower, use a plastic, non-slip stool. Keep the floor dry. Clean up any water that spills on the floor as soon as it happens. Remove soap buildup in the tub or shower regularly. Attach bath mats securely with double-sided non-slip rug tape. Do not have throw rugs and other things on the floor that can make you trip. What can I do in the bedroom? Use night lights. Make sure that you have a light by your bed that is easy to reach. Do not use any sheets or blankets that are too big for your bed. They should not hang down onto the floor. Have a firm chair that has side arms. You can use this for support while you get dressed. Do not have throw rugs and other things  on the floor that can make you trip. What can I do in the kitchen? Clean up any spills right away. Avoid walking on wet floors. Keep items that you use a lot in easy-to-reach places. If you need to reach something above you, use a strong step stool that has a grab bar. Keep electrical cords out of the way. Do not use floor polish or wax that makes floors slippery. If you must use wax, use non-skid floor wax. Do not have throw rugs and other things on the floor that can make you trip. What can I do with my stairs? Do not leave any items on the stairs. Make sure that there are handrails on both sides of the stairs and use them. Fix handrails that are broken or loose. Make sure that handrails are as long as the stairways. Check any carpeting to make sure that it is firmly attached to the stairs. Fix any carpet that is loose or worn. Avoid having throw rugs at the top or bottom of the stairs. If you do have throw rugs, attach them to the floor with carpet tape. Make sure that you have a light switch at the top of the stairs and the bottom of the stairs. If you do not have them, ask someone to add them for you. What else can I do to help prevent falls? Wear shoes that: Do not have high heels. Have rubber bottoms. Are comfortable and fit you well. Are closed at the toe. Do not wear sandals. If you use a stepladder: Make sure that it is fully opened. Do not climb a closed stepladder. Make sure that both sides of the stepladder are locked into place. Ask someone to hold it for you, if possible. Clearly mark and make sure that you can see: Any grab bars or handrails. First and last steps. Where the edge of each step is. Use tools that help you move around (mobility aids) if they are needed. These include: Canes. Walkers. Scooters. Crutches. Turn on the lights when you go into a dark area. Replace any light bulbs as soon as they burn out. Set up your furniture so  you have a clear path. Avoid  moving your furniture around. If any of your floors are uneven, fix them. If there are any pets around you, be aware of where they are. Review your medicines with your doctor. Some medicines can make you feel dizzy. This can increase your chance of falling. Ask your doctor what other things that you can do to help prevent falls. This information is not intended to replace advice given to you by your health care provider. Make sure you discuss any questions you have with your health care provider. Document Released: 05/18/2009 Document Revised: 12/28/2015 Document Reviewed: 08/26/2014 Elsevier Interactive Patient Education  2017 [employer].

## 2022-08-15 ENCOUNTER — Telehealth: Payer: Self-pay

## 2022-08-15 ENCOUNTER — Other Ambulatory Visit: Payer: Medicare HMO

## 2022-08-15 ENCOUNTER — Other Ambulatory Visit: Payer: Self-pay | Admitting: Family Medicine

## 2022-08-15 DIAGNOSIS — R300 Dysuria: Secondary | ICD-10-CM

## 2022-08-15 MED ORDER — CEPHALEXIN 500 MG PO CAPS: 500.0000 mg | ORAL_CAPSULE | Freq: Three times a day (TID) | ORAL | 0 refills | Status: DC

## 2022-08-15 NOTE — Telephone Encounter (Signed)
Pt's husband brought in a urine sample to office concerned pt Sperbeck have a UTI. Pt is c/o not being able to sleep at night, and constant pain. Pt's husband would like a cb from nurse please. Please advise.  Cb#: [phone]

## 2022-08-16 ENCOUNTER — Other Ambulatory Visit: Payer: Self-pay | Admitting: Family Medicine

## 2022-08-16 LAB — URINALYSIS, ROUTINE W REFLEX MICROSCOPIC
Bilirubin Urine: NEGATIVE
Glucose, UA: NEGATIVE
Hgb urine dipstick: NEGATIVE
Hyaline Cast: NONE SEEN /LPF
Ketones, ur: NEGATIVE
Nitrite: POSITIVE — AB
Protein, ur: NEGATIVE
Specific Gravity, Urine: 1.012 (ref 1.001–1.035)
pH: 7 (ref 5.0–8.0)

## 2022-08-16 LAB — MICROSCOPIC MESSAGE

## 2022-08-16 MED ORDER — TRAZODONE HCL 50 MG PO TABS: ORAL_TABLET | ORAL | 2 refills | Status: DC

## 2022-08-17 LAB — URINE CULTURE
MICRO NUMBER:: 14419418
SPECIMEN QUALITY:: ADEQUATE

## 2022-08-19 ENCOUNTER — Other Ambulatory Visit: Payer: Self-pay | Admitting: Family Medicine

## 2022-08-19 MED ORDER — SULFAMETHOXAZOLE-TRIMETHOPRIM 800-160 MG PO TABS: 1.0000 | ORAL_TABLET | Freq: Two times a day (BID) | ORAL | 0 refills | Status: DC

## 2022-08-21 NOTE — Progress Notes (Unsigned)
Care Management & Coordination Services Pharmacy Note  08/21/2022 Name:  Kelly [name] MRN:  [id number] DOB:  [birth year]  Summary: ***  Recommendations/Changes made from today's visit: ***  Follow up plan: ***   Subjective: Kelly [name] is an 72 y.o. year old female who is a primary patient of Pickard, [name], MD.  The care coordination team was consulted for assistance with disease management and care coordination needs.    Engaged with patient by telephone for follow up visit.  Recent office visits:  06/01/2021 OV (PCP) [name], MD; "I have recommended that we stop Ambien and Klonopin and avoid any centrally acting medication due to recent events.  I was honest with the patient about my concerns and explained to her in detail why I was worried about using Klonopin and Ambien.  Therefore I will no longer refill these medications. If her lab work is stable, if she insist on managing her anxiety, I will try sertraline 50 mg p.o. nightly as I feel that this is much safer for her and less apt to cause falls and confusion."   03/09/2021 VV (PCP) [name], MD;  "I recommended stopping the Ambien at night as the patient discussed.  We will start Klonopin 0.5 mg tablets.  She can take 1/2 to 1 tablet every 12 hours as needed for anxiety.  I will give her 30 tablets and I have encouraged her to use these sparingly.  I would be extremely happy if these 30 tablets lasts her 8 months again.  Have also cautioned her to be careful and not to combine the medication with hydrocodone which she takes perhaps once a day for pain in her back."   Recent consult visits:  05/11/2021 OV (Podiatry) [name], DPM; no medication changes indicated.   Hospital visits:  None in previous 6 months   Objective:  Lab Results  Component Value Date   CREATININE 0.54 06/19/2022   BUN 7 06/19/2022   GFR 91.17 06/19/2022   EGFR 84 11/27/2021   GFRNONAA 89 01/18/2021   GFRAA 103  01/18/2021   NA 142 06/19/2022   K 3.3 (L) 06/19/2022   CALCIUM 8.8 06/19/2022   CO2 26 06/19/2022   GLUCOSE 97 06/19/2022    Lab Results  Component Value Date/Time   HGBA1C 4.6 11/27/2021 11:51 AM   HGBA1C 4.8 06/01/2021 01:07 PM   GFR 91.17 06/19/2022 09:27 AM   GFR 80.35 01/16/2022 03:17 PM   MICROALBUR 1.5 01/24/2020 03:28 PM   MICROALBUR 1.7 09/08/2017 12:04 PM    Last diabetic Eye exam: No results found for: "HMDIABEYEEXA"  Last diabetic Foot exam: No results found for: "HMDIABFOOTEX"   Lab Results  Component Value Date   CHOL 160 06/01/2021   HDL 57 06/01/2021   LDLCALC 83 06/01/2021   TRIG 106 06/01/2021   CHOLHDL 2.8 06/01/2021       Latest Ref Rng & Units 06/19/2022    9:27 AM 01/16/2022    3:17 PM 11/27/2021   11:51 AM  Hepatic Function  Total Protein 6.0 - 8.3 g/dL 6.2  7.0  6.4   Albumin 3.5 - 5.2 g/dL 3.5  4.1    AST 0 - 37 U/L '29  26  21   '$ ALT 0 - 35 U/L '15  12  9   '$ Alk Phosphatase 39 - 117 U/L 97  83    Total Bilirubin 0.2 - 1.2 mg/dL 1.8  2.3  1.2  Lab Results  Component Value Date/Time   TSH 1.77 11/27/2021 11:51 AM   TSH 5.59 (H) 06/01/2021 01:07 PM       Latest Ref Rng & Units 06/19/2022    9:27 AM 04/04/2022    3:01 PM 01/16/2022    3:17 PM  CBC  WBC 4.0 - 10.5 K/uL 5.9  5.4  5.7   Hemoglobin 12.0 - 15.0 g/dL 12.5  12.5  13.5   Hematocrit 36.0 - 46.0 % 35.7  35.1  38.8   Platelets 150.0 - 400.0 K/uL 63.0  54  56.0     Lab Results  Component Value Date/Time   VITAMINB12 563 11/28/2017 02:57 PM   VITAMINB12 186 09/15/2016 08:38 AM    Clinical ASCVD: {YES/NO:21197} The ASCVD Risk score (Arnett DK, et al., 2019) failed to calculate for the following reasons:   The systolic blood pressure is missing    ***Other: (CHADS2VASc if Afib, MMRC or CAT for COPD, ACT, DEXA)     07/18/2022    1:39 PM 05/21/2022   11:32 AM 07/12/2021   11:13 AM  Depression screen PHQ 2/9  Decreased Interest '1 1 1  '$ Down, Depressed, Hopeless '1 1 1  '$ PHQ  - 2 Score '2 2 2  '$ Altered sleeping '3 3 1  '$ Tired, decreased energy '1 1 1  '$ Change in appetite 1 1 0  Feeling bad or failure about yourself  0 0 0  Trouble concentrating 1 0 0  Moving slowly or fidgety/restless 1 1 0  Suicidal thoughts 0 0 0  PHQ-9 Score '9 8 4  '$ Difficult doing work/chores   Somewhat difficult     Social History   Tobacco Use  Smoking Status Every Day   Packs/day: 0.50   Types: Cigarettes  Smokeless Tobacco Never   BP Readings from Last 3 Encounters:  07/08/22 (!) 170/80  06/19/22 138/80  05/21/22 134/78   Pulse Readings from Last 3 Encounters:  07/08/22 (!) 58  06/19/22 (!) 55  05/21/22 (!) 50   Wt Readings from Last 3 Encounters:  07/18/22 145 lb (65.8 kg)  07/08/22 150 lb 9.6 oz (68.3 kg)  06/19/22 152 lb (68.9 kg)   BMI Readings from Last 3 Encounters:  07/18/22 24.13 kg/m  07/08/22 25.45 kg/m  06/19/22 25.69 kg/m    Allergies  Allergen Reactions   Prednisone Itching    Heart rate increased   Celecoxib Rash   Macrodantin [Nitrofurantoin Macrocrystal] Rash    Medications Reviewed Today     Reviewed by Bernita Raisin, LPN (Licensed Practical Nurse) on 07/18/22 at 8  Med List Status: <None>   Medication Order Taking? Sig Documenting Provider Last Dose Status Informant  ACCU-CHEK AVIVA PLUS test strip [id number] Yes  [provider] Taking Active Spouse/Significant Other  ACCU-CHEK SOFTCLIX LANCETS lancets [id number] Yes  [provider] Taking Active Spouse/Significant Other  atorvastatin (LIPITOR) 20 MG tablet [id number] Yes TAKE 1 TABLET EVERY DAY Pickard, [name], MD Taking Active Spouse/Significant Other  Blood Glucose Monitoring Suppl (ACCU-CHEK AVIVA PLUS) w/Device KIT [id number] Yes  [provider] Taking Active Spouse/Significant Other  cyanocobalamin ((VITAMIN B-12)) injection 1,000 mcg [id number]   [name], MD  Active   FEROSUL 325 (65 Fe) MG tablet [id number] Yes TAKE 1 TABLET EVERY DAY  [name], MD Taking Active Spouse/Significant Other  FLUoxetine (PROZAC) 40 MG capsule [id number] Yes Take 1 capsule (40 mg total) by mouth daily. [name], MD Taking Active Spouse/Significant Other  furosemide (LASIX) 20  MG tablet [id number] Yes TAKE 1 TABLET EVERY DAY [name], MD Taking Active   levothyroxine (SYNTHROID) 88 MCG tablet [id number] Yes TAKE 1 TABLET EVERY DAY [name], MD Taking Active   losartan (COZAAR) 100 MG tablet [id number] Yes TAKE 1 TABLET EVERY DAY [name], MD Taking Active Spouse/Significant Other  nadolol (CORGARD) 20 MG tablet [id number] Yes Take 0.5 tablets (10 mg total) by mouth daily. Mansouraty, Telford Nab., MD Taking Active   pantoprazole (PROTONIX) 40 MG tablet [id number] Yes TAKE 1 TABLET EVERY DAY  Patient taking differently: Take 40 mg by mouth at bedtime.   [name], MD Taking Active Spouse/Significant Other  potassium chloride (KLOR-CON M) 10 MEQ tablet [id number] Yes Take 2 tablets (20 mEq total) by mouth daily. [name], MD Taking Active   traZODone (DESYREL) 50 MG tablet [id number] Yes TAKE 1 TABLET BY MOUTH EVERY DAY AT BEDTIME AS NEEDED [name], MD Taking Active             SDOH:  (Social Determinants of Health) assessments and interventions performed: {yes/no:20286} SDOH Interventions    Flowsheet Row Clinical Support from 07/18/2022 in [facility] from 07/12/2021 in [facility] Interventions    Food Insecurity Interventions Intervention Not Indicated Intervention Not Indicated  Housing Interventions Intervention Not Indicated Intervention Not Indicated  Transportation Interventions Intervention Not Indicated Intervention Not Indicated  Utilities Interventions Intervention Not Indicated --  Alcohol Usage Interventions Intervention Not Indicated (Score <7) --  Depression Interventions/Treatment  Medication Patient  refuses Treatment  [Pt's husband is having an upcoming surgery and is probably going to rehab and pt is concerned regarding that. Pt declines counseling at this time.]  Financial Strain Interventions Intervention Not Indicated Intervention Not Indicated  Physical Activity Interventions Intervention Not Indicated Patient Refused  Stress Interventions Intervention Not Indicated Patient Refused  Social Connections Interventions Intervention Not Indicated Patient Refused      [place]night: No Food Insecurity (07/18/2022)  Housing: Low Risk  (07/18/2022)  Transportation Needs: No Transportation Needs (07/18/2022)  Utilities: Not At Risk (07/18/2022)  Alcohol Screen: Low Risk  (07/18/2022)  Depression (PHQ2-9): Medium Risk (07/18/2022)  Financial Resource Strain: Low Risk  (07/18/2022)  Physical Activity: Inactive (07/18/2022)  Social Connections: Moderately Integrated (07/18/2022)  Stress: Stress Concern Present (07/18/2022)  Tobacco Use: High Risk (07/18/2022)    Medication Assistance: {MEDASSISTANCEINFO:25044}  Medication Access: Within the past 30 days, how often has patient missed a dose of medication? *** Is a pillbox or other method used to improve adherence? {YES/NO:21197} Factors that [place] affect medication adherence? {CHL DESC; BARRIERS:21522} Are meds synced by current pharmacy? {YES/NO:21197} Are meds delivered by current pharmacy? {YES/NO:21197} Does patient experience delays in picking up medications due to transportation concerns? {YES/NO:21197}  [facility] Reviewed: Is patient disadvantaged to use [facility]?: {YES/NO:21197} Current Rx insurance plan: *** Name and location of Current pharmacy:  [id number] [place] [place] [address]Phone: [phone]: [phone] [facility]Phone: [phone]: [phone] [address]Phone: [phone]: [phone] [facility] services reviewed with patient today?: {YES/NO:21197} Patient requests to transfer care to [facility]?: {YES/NO:21197} Reason patient declined to change pharmacies: {US patient preference:27474}  Compliance/Adherence/Medication fill history: Care Gaps: ***  Star-Rating Drugs: ***   Assessment/Plan     Hypertension (BP goal <130/80) -Controlled, not assessed -Current treatment: Losartan '100mg'$  daily Appropriate, Effective, Safe, Accessible Amlodipine '10mg'$  daily Appropriate, Effective, Safe, Accessible -Medications previously tried: metoprolol, nadolol, propranolol  -Current home readings: no logs provided today -Current dietary habits: usually only eating breakfast and lunch, does not have much of an appetite lately -Current exercise habits: "plenty" takes care of her husband, nothing routine -Reports hypotensive/hypertensive symptoms - occasional dizziness -Educated on Exercise goal of 150 minutes per week; Importance of home blood pressure monitoring; Symptoms of hypotension and importance of maintaining adequate hydration; -Counseled to monitor BP at home a few times per week, document, and provide log at future appointments -Recommended to continue current medication Counseled on going slow when switching from sitting to standing positions  Update 12/13/21 Reports BP has improved.  No need to make changes today until we see CT of abdomen and pelvis. Continue to monitor BP at home and contact us if consistently elevated above goal. No changes at this time.  Hyperlipidemia: (LDL goal < 70) 04/25/22 -Not ideally controlled, most recent LDL is 83 -Current treatment: Atorvastatin '20mg'$  daily Appropriate,  Query Effective -Medications previously tried: none noted  -Current dietary patterns: see HTN -Current exercise habits: see HTN -Educated on Cholesterol goals;  Benefits of statin for ASCVD risk reduction; Importance of limiting foods high in cholesterol; -Recommended to continue current medication -ASCVD still high risk in patient with CAD.  Not quite at goal. Still would recommend increase to atorvastatin '40mg'$  - will consult with PCP on switch but patient needs to come in for updated lipid panel.  Diabetes (A1c goal <7%) -Controlled -Current medications: None -Medications previously tried: glipizide, glimerpiride  -Current home glucose readings fasting glucose: unknown post prandial glucose: unknown -Reports hypoglycemic/hyperglycemic symptoms - shakiness later in the day -Current meal patterns: inconsistent meals, skipping lunch often due to low appetite -Current exercise: caring for husband -Educated on A1c and blood sugar goals; Prevention and management of hypoglycemic episodes; Benefits of routine self-monitoring of blood sugar; -Counseled to check feet daily and get yearly eye exams -Recommended to continue current medication   Update 12/13/21 She has stopped all of her medications for blood sugar. She is not checking at home but denies symptoms of hyper or hypoglycemia. Due to decreasing A1c and decreased appetite, I believe it is best that she remains off for now.  She mentions episode of severe epigastric pain when she needed to defecate. She has CT of the abdomen scheduled for next week. At this time no changes necessary.  Depression/Anxiety (Goal: Minimize symptoms) 04/25/22 -Controlled -Current treatment: Fluoxetine '40mg'$  Appropriate, Query effective, Trazodone '50mg'$  Appropriate, Query effective, -Medications previously tried/failed: sertraline, clonazepam, trazodone -PHQ9:     07/12/2021   11:13 AM 09/13/2020   11:54 AM 07/27/2020   12:24 PM  PHQ9 SCORE ONLY   PHQ-9 Total Score 4 0 21  -She has been having problems with her stomach more than usual lately and has reported not being able to sleep. -Reports she took fluoxetine for 3 days - counseled on it taking 3-4 weeks for full benefit. -She is interested in new rx for trazodone but fill history shows refills left and filled in August for 90 days supply. I will consult with PCP but would like to see her take both fluoxetine for depression and trazodone to help with sleep.  I think she [place] need to start pantoprazole for her stomach issues as it appears she has not been filling this.   Hypothyroidism (Goal: maintain TSH) -Controlled, not assessed -Current treatment  Levothyroxine 65mg daily Appropriate, Query effective, -Medications previously tried: none noted -Recently increased dose to 845m daily, taking appropriately. -Has not had TSH checked since increasing dose.  -Recommended to continue current medication Recommend recheck TSH for efficacy of current dose.  Patient Goals/Self-Care Activities Patient will:  - focus on medication adherence by pill box check glucose a few times per week, document, and provide at future appointments check blood pressure weekly, document, and provide at future appointments  Follow Up Plan: The care management team will reach out to the patient again over the next 120 days.          ChBeverly MilchPharmD, CPP Clinical Pharmacist Practitioner [phone]

## 2022-08-27 ENCOUNTER — Telehealth: Payer: Self-pay | Admitting: Pharmacist

## 2022-08-27 NOTE — Progress Notes (Signed)
Care Management & Coordination Services Pharmacy Team  Reason for Encounter: Appointment Reminder  Contacted patient to confirm telephone appointment with Leata Mouse PharmD, on 08/29/2022 at 12 pm.  Star Rating Drugs:  Atorvastatin 20 mg last filled 08/09/2022 90 DS Losartan 100 mg last filled 05/07/2022 90 DS   Care Gaps: Annual wellness visit in last year? Yes  Future Appointments  Date Time Provider [place]  08/29/2022 12:00 PM [name], [place] [facility] None  09/13/2022 11:15 AM [name], DPM [facility]  07/24/2023 12:00 PM [facility]SE HEALTH ADVISOR [facility]   April D Calhoun, [place] Pharmacist Assistant [phone]

## 2022-08-29 ENCOUNTER — Encounter: Payer: Medicare HMO | Admitting: Pharmacist

## 2022-09-11 ENCOUNTER — Ambulatory Visit: Payer: Medicare HMO | Admitting: Family Medicine

## 2022-09-13 ENCOUNTER — Ambulatory Visit: Payer: Medicare HMO | Admitting: Podiatry

## 2022-09-13 ENCOUNTER — Ambulatory Visit: Payer: Medicare HMO | Admitting: Family Medicine

## 2022-09-25 ENCOUNTER — Ambulatory Visit (INDEPENDENT_AMBULATORY_CARE_PROVIDER_SITE_OTHER): Payer: Medicare HMO | Admitting: Family Medicine

## 2022-09-25 ENCOUNTER — Encounter: Payer: Self-pay | Admitting: Family Medicine

## 2022-09-25 VITALS — BP 170/90 | HR 72 | Temp 98.4°F | Ht 65.0 in | Wt 140.0 lb

## 2022-09-25 DIAGNOSIS — J069 Acute upper respiratory infection, unspecified: Secondary | ICD-10-CM

## 2022-09-25 MED ORDER — AZITHROMYCIN 250 MG PO TABS: ORAL_TABLET | ORAL | 0 refills | Status: AC

## 2022-09-25 NOTE — Progress Notes (Signed)
Acute Office Visit  Subjective:     Patient ID: Kelly [name], female    DOB: [birth year], 72 y.o.   MRN: [id number]  Chief Complaint  Patient presents with   Cough    cough/congestion     Cough   Patient is in today for 2 weeks of persistent mucopurulent cough, weakness, fatigue, intermittent shortness of breath and wheezing with activity, and chills. Denies fever, chest pain, sinus pain/pressure, sore throat, ear pain. Has tried OTC cough medication without relief.  Review of Systems  Respiratory:  Positive for cough.   All other systems reviewed and are negative.   Past Medical History:  Diagnosis Date   Anxiety    Chronic upper GI bleeding    intestinal AVM's, portal gastropathy, esophageal varices on egd 04/2018   Coronary artery disease involving native coronary artery without angina pectoris    Dementia ([facility]) 09/16/2016   Depression    DIVERTICULITIS OF COLON 06/14/2008   Qualifier: Diagnosis of  By: Gretta Cool RN, Malachy Mood     DM (diabetes mellitus), type 2 ([place]) 09/15/2016   Esophageal varices determined by endoscopy ([place])    Hyperlipidemia    Hypertension    Hypothyroidism    Incidental pulmonary nodule, greater than or equal to 53m 10/14/2018   LLL nodule needs f/u CT or PET-CT scan   Irritable bowel syndrome 04/06/2008   Centricity Description: IBS Qualifier: Diagnosis of  By: [name] (ADeborra Medina, [name]   Centricity Description: IRRITABLE BOWEL SYNDROME Qualifier: Diagnosis of  By: [name], [name]   Liver cirrhosis secondary to NASH ([place] 09/16/2016   NASH (nonalcoholic steatohepatitis)    Obesity    Portal hypertensive gastropathy ([facility]    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 04/20/19: s/p TAVR wtih a 236mMedtronic CoreValve Evolut Pro via the TF approach   Severe aortic stenosis    Splenomegaly 09/16/2016   Thrombocytopenia ([facility])    Thyroid disease    Tobacco abuse    Vitamin D deficiency    Past Surgical History:  Procedure  Laterality Date   BIOPSY  04/04/2022   Procedure: BIOPSY;  Surgeon: MaIrving Copas MD;  [facility];  Service: Gastroenterology;;   CARDIAC CATHETERIZATION     COLONOSCOPY WITH PROPOFOL N/A 04/09/2018   Procedure: COLONOSCOPY WITH PROPOFOL;  Surgeon: JaMilus BanisterMD;  [facility];  Service: Endoscopy;  Laterality: N/A;   COLONOSCOPY WITH PROPOFOL N/A 04/04/2022   Procedure: COLONOSCOPY WITH PROPOFOL;  Surgeon: MaRush LandmarkaTelford Nab MD;  [facility];  Service: Gastroenterology;  Laterality: N/A;   CORONARY ANGIOPLASTY     CORONARY STENT INTERVENTION N/A 02/11/2019   Procedure: CORONARY STENT INTERVENTION;  Surgeon: McBurnell BlanksMD;  [facility];  Service: Cardiovascular;  Laterality: N/A;  Prox RCA Prox LAD   ESOPHAGOGASTRODUODENOSCOPY (EGD) WITH PROPOFOL N/A 04/09/2018   Procedure: ESOPHAGOGASTRODUODENOSCOPY (EGD) WITH PROPOFOL;  Surgeon: JaMilus BanisterMD;  [facility];  Service: Endoscopy;  Laterality: N/A;   ESOPHAGOGASTRODUODENOSCOPY (EGD) WITH PROPOFOL N/A 04/04/2022   Procedure: ESOPHAGOGASTRODUODENOSCOPY (EGD) WITH PROPOFOL;  Surgeon: MaRush LandmarkaTelford Nab MD;  [facility];  Service: Gastroenterology;  Laterality: N/A;   oopherectomy     OVARIAN CYST REMOVAL     POLYPECTOMY  04/09/2018   Procedure: POLYPECTOMY;  Surgeon: JaMilus BanisterMD;  [facility];  Service: Endoscopy;;   POLYPECTOMY  04/04/2022   Procedure: POLYPECTOMY;  Surgeon: MaIrving Copas MD;  [facility];  Service: Gastroenterology;;   RIGHT/LEFT HEART CATH AND CORONARY ANGIOGRAPHY N/A 10/07/2018   Procedure: RIGHT/LEFT HEART CATH AND CORONARY ANGIOGRAPHY;  Surgeon: [name], MD;  Location: [facility];  Service: Cardiovascular;  Laterality: N/A;   TEE WITHOUT CARDIOVERSION N/A 04/20/2019   Procedure: TRANSESOPHAGEAL ECHOCARDIOGRAM (TEE);  Surgeon: [name],  MD;  Location: [facility];  Service: Open Heart Surgery;  Laterality: N/A;   TRANSCATHETER AORTIC VALVE REPLACEMENT, TRANSFEMORAL N/A 04/20/2019   Procedure: TRANSCATHETER AORTIC VALVE REPLACEMENT, TRANSFEMORAL approach using 63m Evolut PRO+ medtronic valve;  Surgeon: MBurnell Blanks MD;  [facility]  Service: Open Heart Surgery;  Laterality: N/A;   Current Outpatient Medications on File Prior to Visit  Medication Sig Dispense Refill   ACCU-CHEK AVIVA PLUS test strip      ACCU-CHEK SOFTCLIX LANCETS lancets      atorvastatin (LIPITOR) 20 MG tablet TAKE 1 TABLET EVERY DAY 90 tablet 3   Blood Glucose Monitoring Suppl (ACCU-CHEK AVIVA PLUS) w/Device KIT      FEROSUL 325 (65 Fe) MG tablet TAKE 1 TABLET EVERY DAY 90 tablet 1   FLUoxetine (PROZAC) 40 MG capsule Take 1 capsule (40 mg total) by mouth daily. 90 capsule 3   furosemide (LASIX) 20 MG tablet TAKE 1 TABLET EVERY DAY 90 tablet 2   levothyroxine (SYNTHROID) 88 MCG tablet TAKE 1 TABLET EVERY DAY 90 tablet 10   losartan (COZAAR) 100 MG tablet TAKE 1 TABLET EVERY DAY 90 tablet 1   nadolol (CORGARD) 20 MG tablet Take 0.5 tablets (10 mg total) by mouth daily. 30 tablet 12   pantoprazole (PROTONIX) 40 MG tablet TAKE 1 TABLET EVERY DAY (Patient taking differently: Take 40 mg by mouth at bedtime.) 90 tablet 3   potassium chloride (KLOR-CON M) 10 MEQ tablet Take 2 tablets (20 mEq total) by mouth daily. 180 tablet 3   traZODone (DESYREL) 50 MG tablet TAKE 1 TABLET BY MOUTH EVERY DAY AT BEDTIME AS NEEDED 90 tablet 2   Current Facility-Administered Medications on File Prior to Visit  Medication Dose Route Frequency Provider Last Rate Last Admin   cyanocobalamin ((VITAMIN B-12)) injection 1,000 mcg  1,000 mcg Subcutaneous Q30 days PSusy Frizzle MD   1,000 mcg at 09/08/17 1408   Allergies  Allergen Reactions   Prednisone Itching    Heart rate increased   Celecoxib Rash   Macrodantin [Nitrofurantoin Macrocrystal] Rash         Objective:    BP (!) 170/90   Pulse 72   Temp 98.4 F (36.9 C) (Oral)   Ht 5' 5"$  (1.651 m)   Wt 140 lb (63.5 kg)   SpO2 95%   BMI 23.30 kg/m    Physical Exam Vitals and nursing note reviewed.  Constitutional:      Appearance: Normal appearance. She is normal weight.  HENT:     Head: Normocephalic and atraumatic.  Cardiovascular:     Rate and Rhythm: Normal rate and regular rhythm.     Pulses: Normal pulses.     Heart sounds: Murmur heard.  Pulmonary:     Effort: Pulmonary effort is normal.     Breath sounds: Examination of the right-upper field reveals rhonchi. Examination of the right-middle field reveals rhonchi. Examination of the right-lower field reveals rhonchi. Rhonchi present.  Skin:    General: Skin is warm and dry.  Neurological:     General: No focal deficit present.     Mental Status: She is alert  and oriented to person, place, and time. Mental status is at baseline.  Psychiatric:        Mood and Affect: Mood normal.        Behavior: Behavior normal.        Thought Content: Thought content normal.        Judgment: Judgment normal.     No results found for any visits on 09/25/22.      Assessment & Plan:   Problem List Items Addressed This Visit       Respiratory   Upper respiratory tract infection - Primary    Patients symptoms and exam consistent with persistent URI, I am concerned about progression to pneumonia given the coarse lung sounds, chills, and malaise. Will order Z-pak for 5 days. [place] continue OTC symptomatic management. Instructed to return to office if symptoms persist or worsen.      Relevant Medications   azithromycin (ZITHROMAX) 250 MG tablet    Meds ordered this encounter  Medications   azithromycin (ZITHROMAX) 250 MG tablet    Sig: Take 2 tablets on day 1, then 1 tablet daily on days 2 through 5    Dispense:  6 tablet    Refill:  0    Order Specific Question:   Supervising Provider    Answer:   Jenna Luo T [[id number]]     Return if symptoms worsen or fail to improve.  Rubie Maid, FNP

## 2022-09-25 NOTE — Assessment & Plan Note (Signed)
Patients symptoms and exam consistent with persistent URI, I am concerned about progression to pneumonia given the coarse lung sounds, chills, and malaise. Will order Z-pak for 5 days. [place] continue OTC symptomatic management. Instructed to return to office if symptoms persist or worsen.

## 2022-10-11 ENCOUNTER — Telehealth: Payer: Self-pay

## 2022-10-11 NOTE — Telephone Encounter (Signed)
Spoke w/pt this afternoon, pt had seen FNP last week 09/25/22 and prescribed Abx, However pt stated that she is not feeling well at all. She feels that her symptoms are getting worst, feeling weak, no strength.   Pt would like another dose of Abx? Pls advice?

## 2022-10-14 ENCOUNTER — Telehealth: Payer: Self-pay

## 2022-10-14 NOTE — Telephone Encounter (Signed)
Spoke w/pt this morning, has appt for tom at 8:15AM w/pickard

## 2022-10-15 ENCOUNTER — Encounter: Payer: Self-pay | Admitting: Family Medicine

## 2022-10-15 ENCOUNTER — Ambulatory Visit (INDEPENDENT_AMBULATORY_CARE_PROVIDER_SITE_OTHER): Payer: Medicare HMO | Admitting: Family Medicine

## 2022-10-15 VITALS — BP 118/82 | HR 50 | Temp 98.2°F | Ht 65.0 in | Wt 157.6 lb

## 2022-10-15 DIAGNOSIS — R0781 Pleurodynia: Secondary | ICD-10-CM

## 2022-10-15 DIAGNOSIS — E1169 Type 2 diabetes mellitus with other specified complication: Secondary | ICD-10-CM

## 2022-10-15 MED ORDER — BENZONATATE 200 MG PO CAPS: 200.0000 mg | ORAL_CAPSULE | Freq: Three times a day (TID) | ORAL | 0 refills | Status: DC | PRN

## 2022-10-15 MED ORDER — OXYCODONE HCL 5 MG PO TABS: 5.0000 mg | ORAL_TABLET | Freq: Four times a day (QID) | ORAL | 0 refills | Status: DC | PRN

## 2022-10-15 NOTE — Progress Notes (Signed)
Subjective:    Patient ID: Kelly [name], female    DOB: 1951-03-15, 72 y.o.   MRN: [id number]    Wt Readings from Last 3 Encounters:  10/15/22 157 lb 9.6 oz (71.5 kg)  09/25/22 140 lb (63.5 kg)  07/18/22 145 lb (65.8 kg)   Patient presents today with right-sided rib pain.  The pain is located in her right axilla and onto her right chest.  She is tender palpation over the rib itself when I press.  She states that she has been battling an upper respiratory infection for the last 2 weeks.  She accidentally rolled out of bed and landed on the floor.  That is when she started hurting in her rib.  She also suffered a contusion to the left side of her face.  There is bruising above her left eye on her left cheek that appears old.  She denies any headache.  She denies any double vision or blurry vision.  She denies any hemoptysis or shortness of breath or pleurisy however she is unable to sleep at night due to the discomfort in her ribs. Past Medical History:  Diagnosis Date   Anxiety    Chronic upper GI bleeding    intestinal AVM's, portal gastropathy, esophageal varices on egd 04/2018   Coronary artery disease involving native coronary artery without angina pectoris    Dementia ([place]) 09/16/2016   Depression    DIVERTICULITIS OF COLON 06/14/2008   Qualifier: Diagnosis of  By: Gretta Cool RN, Malachy Mood     DM (diabetes mellitus), type 2 ([place]) 09/15/2016   Esophageal varices determined by endoscopy ([place])    Hyperlipidemia    Hypertension    Hypothyroidism    Incidental pulmonary nodule, greater than or equal to 61m 10/14/2018   LLL nodule needs f/u CT or PET-CT scan   Irritable bowel syndrome 04/06/2008   Centricity Description: IBS Qualifier: Diagnosis of  By: [name] (ADeborra Medina, [name]   Centricity Description: IRRITABLE BOWEL SYNDROME Qualifier: Diagnosis of  By: [name], [name]   L[place] secondary [place]H [place] 09/16/2016   NASH (nonalcoholic steatohepatitis)    Obesity    Portal hypertensive  gastropathy ([place]    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 04/20/19: s/p TAVR wtih a 217mMedtronic CoreValve Evolut Pro via the TF approach   Severe aortic stenosis    Splenomegaly 09/16/2016   Thrombocytopenia (HCC)    Thyroid disease    Tobacco abuse    Vitamin D deficiency    Past Surgical History:  Procedure Laterality Date   BIOPSY  04/04/2022   Procedure: BIOPSY;  Surgeon: MaIrving Copas MD;  [facility];  Service: Gastroenterology;;   CARDIAC CATHETERIZATION     COLONOSCOPY WITH PROPOFOL N/A 04/09/2018   Procedure: COLONOSCOPY WITH PROPOFOL;  Surgeon: JaMilus BanisterMD;  [facility];  Service: Endoscopy;  Laterality: N/A;   COLONOSCOPY WITH PROPOFOL N/A 04/04/2022   Procedure: COLONOSCOPY WITH PROPOFOL;  Surgeon: MaRush LandmarkaTelford Nab MD;  [facility];  Service: Gastroenterology;  Laterality: N/A;   CORONARY ANGIOPLASTY     CORONARY STENT INTERVENTION N/A 02/11/2019   Procedure: CORONARY STENT INTERVENTION;  Surgeon: McBurnell BlanksMD;  [facility];  Service: Cardiovascular;  Laterality: N/A;  Prox RCA Prox LAD   ESOPHAGOGASTRODUODENOSCOPY (EGD) WITH PROPOFOL N/A 04/09/2018   Procedure: ESOPHAGOGASTRODUODENOSCOPY (EGD) WITH PROPOFOL;  Surgeon: JaMilus BanisterMD;  [facility];  Service: Endoscopy;  Laterality: N/A;   ESOPHAGOGASTRODUODENOSCOPY (EGD) WITH PROPOFOL N/A 04/04/2022   Procedure: ESOPHAGOGASTRODUODENOSCOPY (EGD) WITH PROPOFOL;  Surgeon: Rush Landmark Telford Nab., MD;  [facility];  Service: Gastroenterology;  Laterality: N/A;   oopherectomy     OVARIAN CYST REMOVAL     POLYPECTOMY  04/09/2018   Procedure: POLYPECTOMY;  Surgeon: [name], MD;  [facility];  Service: Endoscopy;;   POLYPECTOMY  04/04/2022   Procedure: POLYPECTOMY;  Surgeon: Irving Copas., MD;  [facility];  Service: Gastroenterology;;   RIGHT/LEFT  HEART CATH AND CORONARY ANGIOGRAPHY N/A 10/07/2018   Procedure: RIGHT/LEFT HEART CATH AND CORONARY ANGIOGRAPHY;  Surgeon: [name], MD;  Location: [facility];  Service: Cardiovascular;  Laterality: N/A;   TEE WITHOUT CARDIOVERSION N/A 04/20/2019   Procedure: TRANSESOPHAGEAL ECHOCARDIOGRAM (TEE);  Surgeon: [name], MD;  Location: [facility];  Service: Open Heart Surgery;  Laterality: N/A;   TRANSCATHETER AORTIC VALVE REPLACEMENT, TRANSFEMORAL N/A 04/20/2019   Procedure: TRANSCATHETER AORTIC VALVE REPLACEMENT, TRANSFEMORAL approach using 80m Evolut PRO+ medtronic valve;  Surgeon: MBurnell Blanks MD;  [facility]  Service: Open Heart Surgery;  Laterality: N/A;   Current Outpatient Medications on File Prior to Visit  Medication Sig Dispense Refill   ACCU-CHEK AVIVA PLUS test strip      ACCU-CHEK SOFTCLIX LANCETS lancets      atorvastatin (LIPITOR) 20 MG tablet TAKE 1 TABLET EVERY DAY 90 tablet 3   Blood Glucose Monitoring Suppl (ACCU-CHEK AVIVA PLUS) w/Device KIT      FEROSUL 325 (65 Fe) MG tablet TAKE 1 TABLET EVERY DAY 90 tablet 1   FLUoxetine (PROZAC) 40 MG capsule Take 1 capsule (40 mg total) by mouth daily. 90 capsule 3   furosemide (LASIX) 20 MG tablet TAKE 1 TABLET EVERY DAY 90 tablet 2   levothyroxine (SYNTHROID) 88 MCG tablet TAKE 1 TABLET EVERY DAY 90 tablet 10   losartan (COZAAR) 100 MG tablet TAKE 1 TABLET EVERY DAY 90 tablet 1   nadolol (CORGARD) 20 MG tablet Take 0.5 tablets (10 mg total) by mouth daily. 30 tablet 12   pantoprazole (PROTONIX) 40 MG tablet TAKE 1 TABLET EVERY DAY (Patient taking differently: Take 40 mg by mouth at bedtime.) 90 tablet 3   potassium chloride (KLOR-CON M) 10 MEQ tablet Take 2 tablets (20 mEq total) by mouth daily. 180 tablet 3   traZODone (DESYREL) 50 MG tablet TAKE 1 TABLET BY MOUTH EVERY DAY AT BEDTIME AS NEEDED 90 tablet 2   Current Facility-Administered Medications on File Prior to Visit  Medication Dose  Route Frequency Provider Last Rate Last Admin   cyanocobalamin ((VITAMIN B-12)) injection 1,000 mcg  1,000 mcg Subcutaneous Q30 days PSusy Frizzle MD   1,000 mcg at 09/08/17 1408    Allergies  Allergen Reactions   Prednisone Itching    Heart rate increased   Celecoxib Rash   Macrodantin [Nitrofurantoin Macrocrystal] Rash   Social History   Socioeconomic History   Marital status: Married    Spouse name: Not on file   Number of children: 1   Years of education: Not on file   Highest education level: Not on file  Occupational History   Not on file  Tobacco Use   Smoking status: Every Day    Packs/day: 0.50    Types: Cigarettes   Smokeless tobacco: Never  Vaping Use   Vaping Use: Never used  Substance and Sexual Activity   Alcohol use: No   Drug use:  Not Currently    Types: Benzodiazepines   Sexual activity: Not Currently  Other Topics Concern   Not on file  Social History Narrative   Married x 53 years in 2023.   1 son   2 grandsons   Social Determinants of Health   Financial Resource Strain: Low Risk  (07/18/2022)   Overall Financial Resource Strain (CARDIA)    Difficulty of Paying Living Expenses: Not hard at all  Food Insecurity: No Food Insecurity (07/18/2022)   Hunger Vital Sign    Worried About Running Out of Food in the Last Year: Never true    Ran Out of Food in the Last Year: Never true  Transportation Needs: No Transportation Needs (07/18/2022)   PRAPARE - Hydrologist (Medical): No    Lack of Transportation (Non-Medical): No  Physical Activity: Inactive (07/18/2022)   Exercise Vital Sign    Days of Exercise per Week: 0 days    Minutes of Exercise per Session: 0 min  Stress: Stress Concern Present (07/18/2022)   [place]    Feeling of Stress : To some extent  Social Connections: Moderately Integrated (07/18/2022)   Social Connection and Isolation  Panel [NHANES]    Frequency of Communication with Friends and Family: Three times a week    Frequency of Social Gatherings with Friends and Family: Once a week    Attends Religious Services: 1 to 4 times per year    Active Member of [employer] or Organizations: No    Attends Archivist Meetings: Never    Marital Status: Married  Human resources officer Violence: Not At Risk (07/18/2022)   Humiliation, Afraid, Rape, and Kick questionnaire    Fear of Current or Ex-Partner: No    Emotionally Abused: No    Physically Abused: No    Sexually Abused: No       Review of Systems  Gastrointestinal:  Positive for diarrhea.       Objective:   Physical Exam Constitutional:      Appearance: Normal appearance. She is obese.  Cardiovascular:     Rate and Rhythm: Normal rate and regular rhythm.     Heart sounds: Murmur heard.  Pulmonary:     Effort: Pulmonary effort is normal. No respiratory distress.     Breath sounds: Normal breath sounds. No wheezing, rhonchi or rales.  Chest:     Chest wall: Tenderness present.    Abdominal:     General: Bowel sounds are normal.     Palpations: Abdomen is soft. There is no mass.     Tenderness: There is no guarding or rebound.  Neurological:     Mental Status: She is alert.          Assessment & Plan:  Type 2 diabetes mellitus with other specified complication, without long-term current use of insulin (HCC) - Plan: Hemoglobin A1c, CBC with Differential/Platelet, COMPLETE METABOLIC PANEL WITH GFR  Pain in rib Patient does not appear acutely sick.  She denies any shortness of breath or pleurisy or hemoptysis or fever or chills.  She still has a mild persistent cough but it seems to be getting better.  I believe she likely had an upper respiratory infection I recommended Tessalon Perles 200 mg every 8 hours as needed for cough.  I believe she likely cracked a rib or bruised rib when she fell out of bed.  I will treat that symptomatically with  oxycodone 5 mg  only at night to help her rest.  I cautioned her not to use the pain medication too much to avoid sedation, confusion, and falls.  Chest x-ray if getting worse

## 2022-10-16 LAB — COMPLETE METABOLIC PANEL WITH GFR
AG Ratio: 1.3 (calc) (ref 1.0–2.5)
ALT: 9 U/L (ref 6–29)
AST: 20 U/L (ref 10–35)
Albumin: 3.2 g/dL — ABNORMAL LOW (ref 3.6–5.1)
Alkaline phosphatase (APISO): 132 U/L (ref 37–153)
BUN: 9 mg/dL (ref 7–25)
CO2: 26 mmol/L (ref 20–32)
Calcium: 8.4 mg/dL — ABNORMAL LOW (ref 8.6–10.4)
Chloride: 110 mmol/L (ref 98–110)
Creat: 0.71 mg/dL (ref 0.60–1.00)
Globulin: 2.4 g/dL (calc) (ref 1.9–3.7)
Glucose, Bld: 88 mg/dL (ref 65–99)
Potassium: 3.7 mmol/L (ref 3.5–5.3)
Sodium: 143 mmol/L (ref 135–146)
Total Bilirubin: 1.6 mg/dL — ABNORMAL HIGH (ref 0.2–1.2)
Total Protein: 5.6 g/dL — ABNORMAL LOW (ref 6.1–8.1)
eGFR: 89 mL/min/{1.73_m2} (ref >=60)

## 2022-10-16 LAB — CBC WITH DIFFERENTIAL/PLATELET
Absolute Monocytes: 475 cells/uL (ref 200–950)
Basophils Absolute: 29 cells/uL (ref 0–200)
Basophils Relative: 0.6 %
Eosinophils Absolute: 187 cells/uL (ref 15–500)
Eosinophils Relative: 3.9 %
HCT: 31.5 % — ABNORMAL LOW (ref 35.0–45.0)
Hemoglobin: 10.8 g/dL — ABNORMAL LOW (ref 11.7–15.5)
Lymphs Abs: 1200 cells/uL (ref 850–3900)
MCH: 29.1 pg (ref 27.0–33.0)
MCHC: 34.3 g/dL (ref 32.0–36.0)
MCV: 84.9 fL (ref 80.0–100.0)
MPV: 11.1 fL (ref 7.5–12.5)
Monocytes Relative: 9.9 %
Neutro Abs: 2909 cells/uL (ref 1500–7800)
Neutrophils Relative %: 60.6 %
Platelets: 67 10*3/uL — ABNORMAL LOW (ref 140–400)
RBC: 3.71 10*6/uL — ABNORMAL LOW (ref 3.80–5.10)
RDW: 15.7 % — ABNORMAL HIGH (ref 11.0–15.0)
Total Lymphocyte: 25 %
WBC: 4.8 10*3/uL (ref 3.8–10.8)

## 2022-10-16 LAB — HEMOGLOBIN A1C
Hgb A1c MFr Bld: 4.4 % of total Hgb (ref <5.7)
Mean Plasma Glucose: 80 mg/dL
eAG (mmol/L): 4.4 mmol/L

## 2022-10-28 ENCOUNTER — Encounter: Payer: Self-pay | Admitting: Family Medicine

## 2022-10-28 ENCOUNTER — Ambulatory Visit (INDEPENDENT_AMBULATORY_CARE_PROVIDER_SITE_OTHER): Payer: Medicare HMO | Admitting: Family Medicine

## 2022-10-28 VITALS — BP 160/68 | HR 97 | Temp 97.6°F | Ht 65.0 in | Wt 178.0 lb

## 2022-10-28 DIAGNOSIS — R635 Abnormal weight gain: Secondary | ICD-10-CM | POA: Diagnosis not present

## 2022-10-28 DIAGNOSIS — K7460 Unspecified cirrhosis of liver: Secondary | ICD-10-CM

## 2022-10-28 DIAGNOSIS — L299 Pruritus, unspecified: Secondary | ICD-10-CM | POA: Insufficient documentation

## 2022-10-28 DIAGNOSIS — K7581 Nonalcoholic steatohepatitis (NASH): Secondary | ICD-10-CM | POA: Diagnosis not present

## 2022-10-28 DIAGNOSIS — R609 Edema, unspecified: Secondary | ICD-10-CM

## 2022-10-28 MED ORDER — FUROSEMIDE 20 MG PO TABS: 40.0000 mg | ORAL_TABLET | Freq: Two times a day (BID) | ORAL | 0 refills | Status: DC

## 2022-10-28 NOTE — Assessment & Plan Note (Signed)
Itching to BLE with no rash on exam. Hauser continue hydrocortisone cream since this is working. Return to office if rash returns.

## 2022-10-28 NOTE — Progress Notes (Signed)
Acute Office Visit  Subjective:     Patient ID: Kelly [name], female    DOB: 1950-09-13, 72 y.o.   MRN: [id number]  Chief Complaint  Patient presents with   Acute Visit    f/u on swollen legs and feet (unable to put her shoes on);severe itching (since week of 10/21/22); f/u on broken rib (caused by severe cough) - JBG\\\    HPI Patient is in today for swelling of legs and feet and itching of both legs. She endorses worsening swelling to BLE and abdomen over last month. She reports having increased her water consumption to 7 glasses of water daily. She also reports tiny red bumps on her legs that itch, they are improving, appear as pinpoint red spots and some blisters. She has tried cortisone cream with some improvement. Denies leg pain, redness, or warmth. Denies known exposures to allergens or medication changes. Denies shortness of breath, chest pain, abdominal pain, changes in urine or stool pattern.   Review of Systems  All other systems reviewed and are negative.   Past Medical History:  Diagnosis Date   Anxiety    Chronic upper GI bleeding    intestinal AVM's, portal gastropathy, esophageal varices on egd 04/2018   Coronary artery disease involving native coronary artery without angina pectoris    Dementia ([place]) 09/16/2016   Depression    DIVERTICULITIS OF COLON 06/14/2008   Qualifier: Diagnosis of  By: Gretta Cool RN, Malachy Mood     DM (diabetes mellitus), type 2 ([place]) 09/15/2016   Esophageal varices determined by endoscopy ([place])    Hyperlipidemia    Hypertension    Hypothyroidism    Incidental pulmonary nodule, greater than or equal to 25mm 10/14/2018   LLL nodule needs f/u CT or PET-CT scan   Irritable bowel syndrome 04/06/2008   Centricity Description: IBS Qualifier: Diagnosis of  By: [name] CMA ([name]), [name]   Centricity Description: IRRITABLE BOWEL SYNDROME Qualifier: Diagnosis of  By: [name] MD, [name]    Liver cirrhosis secondary to NASH ([place]) 09/16/2016   NASH (nonalcoholic  steatohepatitis)    Obesity    Portal hypertensive gastropathy ([place])    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 04/20/19: s/p TAVR wtih a 15mm Medtronic CoreValve Evolut Pro via the TF approach   Severe aortic stenosis    Splenomegaly 09/16/2016   Thrombocytopenia (HCC)    Thyroid disease    Tobacco abuse    Vitamin D deficiency    Past Surgical History:  Procedure Laterality Date   BIOPSY  04/04/2022   Procedure: BIOPSY;  Surgeon: Irving Copas., MD;  [facility];  Service: Gastroenterology;;   CARDIAC CATHETERIZATION     COLONOSCOPY WITH PROPOFOL N/A 04/09/2018   Procedure: COLONOSCOPY WITH PROPOFOL;  Surgeon: [name], MD;  [facility];  Service: Endoscopy;  Laterality: N/A;   COLONOSCOPY WITH PROPOFOL N/A 04/04/2022   Procedure: COLONOSCOPY WITH PROPOFOL;  Surgeon: Rush Landmark Telford Nab., MD;  [facility];  Service: Gastroenterology;  Laterality: N/A;   CORONARY ANGIOPLASTY     CORONARY STENT INTERVENTION N/A 02/11/2019   Procedure: CORONARY STENT INTERVENTION;  Surgeon: [name], MD;  Location: [facility];  Service: Cardiovascular;  Laterality: N/A;  Prox RCA Prox LAD   ESOPHAGOGASTRODUODENOSCOPY (EGD) WITH PROPOFOL N/A 04/09/2018   Procedure: ESOPHAGOGASTRODUODENOSCOPY (EGD) WITH PROPOFOL;  Surgeon: [name], MD;  [facility];  Service: Endoscopy;  Laterality: N/A;   ESOPHAGOGASTRODUODENOSCOPY (EGD) WITH PROPOFOL N/A  04/04/2022   Procedure: ESOPHAGOGASTRODUODENOSCOPY (EGD) WITH PROPOFOL;  Surgeon: Rush Landmark Telford Nab., MD;  [facility];  Service: Gastroenterology;  Laterality: N/A;   oopherectomy     OVARIAN CYST REMOVAL     POLYPECTOMY  04/09/2018   Procedure: POLYPECTOMY;  Surgeon: [name], MD;  [facility];  Service: Endoscopy;;   POLYPECTOMY  04/04/2022   Procedure: POLYPECTOMY;  Surgeon: Irving Copas., MD;  [facility];  Service: Gastroenterology;;   RIGHT/LEFT HEART CATH AND CORONARY ANGIOGRAPHY N/A 10/07/2018   Procedure: RIGHT/LEFT HEART CATH AND CORONARY ANGIOGRAPHY;  Surgeon: [name], MD;  Location: [facility];  Service: Cardiovascular;  Laterality: N/A;   TEE WITHOUT CARDIOVERSION N/A 04/20/2019   Procedure: TRANSESOPHAGEAL ECHOCARDIOGRAM (TEE);  Surgeon: [name], MD;  Location: [facility];  Service: Open Heart Surgery;  Laterality: N/A;   TRANSCATHETER AORTIC VALVE REPLACEMENT, TRANSFEMORAL N/A 04/20/2019   Procedure: TRANSCATHETER AORTIC VALVE REPLACEMENT, TRANSFEMORAL approach using 80mm Evolut PRO+ medtronic valve;  Surgeon: [name], MD;  Location: [facility];  Service: Open Heart Surgery;  Laterality: N/A;   Current Outpatient Medications on File Prior to Visit  Medication Sig Dispense Refill   ACCU-CHEK AVIVA PLUS test strip      ACCU-CHEK SOFTCLIX LANCETS lancets      atorvastatin (LIPITOR) 20 MG tablet TAKE 1 TABLET EVERY DAY 90 tablet 3   Blood Glucose Monitoring Suppl (ACCU-CHEK AVIVA PLUS) w/Device KIT      FEROSUL 325 (65 Fe) MG tablet TAKE 1 TABLET EVERY DAY 90 tablet 1   FLUoxetine (PROZAC) 40 MG capsule Take 1 capsule (40 mg total) by mouth daily. 90 capsule 3   furosemide (LASIX) 20 MG tablet TAKE 1 TABLET EVERY DAY 90 tablet 2   levothyroxine (SYNTHROID) 88 MCG tablet TAKE 1 TABLET EVERY DAY 90 tablet 10   losartan (COZAAR) 100 MG tablet TAKE 1 TABLET EVERY DAY 90 tablet 1   nadolol (CORGARD) 20 MG tablet Take 0.5 tablets (10 mg total) by mouth daily. 30 tablet 12   potassium chloride (KLOR-CON M) 10 MEQ tablet Take 2 tablets (20 mEq total) by mouth daily. 180 tablet 3   benzonatate (TESSALON) 200 MG capsule Take 1 capsule (200 mg total) by mouth 3 (three) times daily as needed for cough. (Patient not taking: Reported on 10/28/2022) 30 capsule 0   oxyCODONE (ROXICODONE) 5 MG immediate release tablet Take 1 tablet (5 mg total) by mouth  every 6 (six) hours as needed for severe pain (rib pain). (Patient not taking: Reported on 10/28/2022) 20 tablet 0   pantoprazole (PROTONIX) 40 MG tablet TAKE 1 TABLET EVERY DAY (Patient not taking: Reported on 10/28/2022) 90 tablet 3   traZODone (DESYREL) 50 MG tablet TAKE 1 TABLET BY MOUTH EVERY DAY AT BEDTIME AS NEEDED (Patient not taking: Reported on 10/28/2022) 90 tablet 2   Current Facility-Administered Medications on File Prior to Visit  Medication Dose Route Frequency Provider Last Rate Last Admin   cyanocobalamin ((VITAMIN B-12)) injection 1,000 mcg  1,000 mcg Subcutaneous Q30 days Jenna Luo T, MD   1,000 mcg at 09/08/17 1408   Allergies  Allergen Reactions   Prednisone Itching    Heart rate increased   Celecoxib Rash   Macrodantin [Nitrofurantoin Macrocrystal] Rash        Objective:    BP (!) 160/68   Pulse 97   Temp 97.6 F (36.4 C) (Oral)   Ht 5\' 5"  (1.651 m)   Wt 178  lb (80.7 kg)   SpO2 100%   BMI 29.62 kg/m  BP Readings from Last 3 Encounters:  10/28/22 (!) 160/68  10/15/22 118/82  09/25/22 (!) 170/90   Wt Readings from Last 3 Encounters:  10/28/22 178 lb (80.7 kg)  10/15/22 157 lb 9.6 oz (71.5 kg)  09/25/22 140 lb (63.5 kg)      Physical Exam Vitals and nursing note reviewed.  Constitutional:      Appearance: Normal appearance. She is obese.  HENT:     Head: Normocephalic and atraumatic.  Cardiovascular:     Rate and Rhythm: Normal rate and regular rhythm.     Pulses: Normal pulses.     Heart sounds: Normal heart sounds.  Pulmonary:     Effort: Pulmonary effort is normal.     Breath sounds: Normal breath sounds.  Abdominal:     General: Bowel sounds are normal. There is distension.     Palpations: Abdomen is soft.     Tenderness: There is no abdominal tenderness.  Musculoskeletal:     Right lower leg: Edema present.     Left lower leg: Edema present.  Skin:    General: Skin is warm and dry.     Findings: No petechiae or rash.   Neurological:     General: No focal deficit present.     Mental Status: She is alert and oriented to person, place, and time. Mental status is at baseline.  Psychiatric:        Mood and Affect: Mood normal.        Behavior: Behavior normal.        Thought Content: Thought content normal.        Judgment: Judgment normal.     No results found for any visits on 10/28/22.      Assessment & Plan:   Problem List Items Addressed This Visit       Digestive   Liver cirrhosis secondary to NASH [facility])    Patient has gained 19 pounds in 13 days. She does report increased fluid intake due to being sick. 3+ pitting edema to BLE. Non-pitting protuberant abdomen. Will increase Lasix to 40mg  BID and draw CMP. Will have her return to office for weight and labs in 3 days.      Relevant Orders   COMPLETE METABOLIC PANEL WITH GFR     Musculoskeletal and Integument   Pruritus    Itching to BLE with no rash on exam. [place] continue hydrocortisone cream since this is working. Return to office if rash returns.        Other   Weight gain with edema - Primary   Relevant Orders   COMPLETE METABOLIC PANEL WITH GFR    Meds ordered this encounter  Medications   furosemide (LASIX) 20 MG tablet    Sig: Take 2 tablets (40 mg total) by mouth 2 (two) times daily.    Dispense:  20 tablet    Refill:  0    Order Specific Question:   Supervising Provider    Answer:   Jenna Luo T [[id number]]    Return in about 3 days (around 10/31/2022) for weight and labs.  Rubie Maid, FNP

## 2022-10-28 NOTE — Assessment & Plan Note (Signed)
Patient has gained 19 pounds in 13 days. She does report increased fluid intake due to being sick. 3+ pitting edema to BLE. Non-pitting protuberant abdomen. Will increase Lasix to 40mg  BID and draw CMP. Will have her return to office for weight and labs in 3 days.

## 2022-10-29 LAB — COMPLETE METABOLIC PANEL WITH GFR
AG Ratio: 1.2 (calc) (ref 1.0–2.5)
ALT: 9 U/L (ref 6–29)
AST: 23 U/L (ref 10–35)
Albumin: 3.2 g/dL — ABNORMAL LOW (ref 3.6–5.1)
Alkaline phosphatase (APISO): 124 U/L (ref 37–153)
BUN/Creatinine Ratio: 9 (calc) (ref 6–22)
BUN: 6 mg/dL — ABNORMAL LOW (ref 7–25)
CO2: 26 mmol/L (ref 20–32)
Calcium: 8.5 mg/dL — ABNORMAL LOW (ref 8.6–10.4)
Chloride: 108 mmol/L (ref 98–110)
Creat: 0.7 mg/dL (ref 0.60–1.00)
Globulin: 2.7 g/dL (calc) (ref 1.9–3.7)
Glucose, Bld: 90 mg/dL (ref 65–99)
Potassium: 4.2 mmol/L (ref 3.5–5.3)
Sodium: 140 mmol/L (ref 135–146)
Total Bilirubin: 1.4 mg/dL — ABNORMAL HIGH (ref 0.2–1.2)
Total Protein: 5.9 g/dL — ABNORMAL LOW (ref 6.1–8.1)
eGFR: 91 mL/min/{1.73_m2} (ref >=60)

## 2022-10-31 ENCOUNTER — Ambulatory Visit (INDEPENDENT_AMBULATORY_CARE_PROVIDER_SITE_OTHER): Payer: Medicare HMO | Admitting: Family Medicine

## 2022-10-31 ENCOUNTER — Encounter: Payer: Self-pay | Admitting: Family Medicine

## 2022-10-31 VITALS — BP 120/62 | HR 51 | Temp 98.2°F | Ht 65.0 in | Wt 165.0 lb

## 2022-10-31 DIAGNOSIS — K7460 Unspecified cirrhosis of liver: Secondary | ICD-10-CM

## 2022-10-31 MED ORDER — FUROSEMIDE 40 MG PO TABS: 40.0000 mg | ORAL_TABLET | Freq: Every day | ORAL | 3 refills | Status: DC

## 2022-10-31 MED ORDER — SPIRONOLACTONE 25 MG PO TABS: 25.0000 mg | ORAL_TABLET | Freq: Every day | ORAL | 3 refills | Status: DC

## 2022-10-31 NOTE — Progress Notes (Signed)
Subjective:    Patient ID: Kelly [name], female    DOB: [birth year], 72 y.o.   MRN: [id number]    Wt Readings from Last 3 Encounters:  10/31/22 165 lb (74.8 kg)  10/28/22 178 lb (80.7 kg)  10/15/22 157 lb 9.6 oz (71.5 kg)   I saw the patient March 12.  She weighed 157 pounds.  My partner saw her on March 25 and her weight had increased to 178 pounds.  She was having ascites in her abdomen as well as pitting edema in her extremities.  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  She typically takes Lasix 20 mg a day along with potassium.  My partner put her on Lasix 40 mg twice daily and she is here today for follow-up.  This was 3 days ago.  The patient has diuresed 13 pounds and her weight is down to 165.  I believe her dry weight is closer to 157.  She denies any cramps.  She denies any dizziness.  She denies any lightheadedness.  Past Medical History:  Diagnosis Date   Anxiety    Chronic upper GI bleeding    intestinal AVM's, portal gastropathy, esophageal varices on egd 04/2018   Coronary artery disease involving native coronary artery without angina pectoris    Dementia ([place]) 09/16/2016   Depression    DIVERTICULITIS OF COLON 06/14/2008   Qualifier: Diagnosis of  By: Gretta Cool RN, Malachy Mood     DM (diabetes mellitus), type 2 ([place]) 09/15/2016   Esophageal varices determined by endoscopy ([place])    Hyperlipidemia    Hypertension    Hypothyroidism    Incidental pulmonary nodule, greater than or equal to 62mm 10/14/2018   LLL nodule needs f/u CT or PET-CT scan   Irritable bowel syndrome 04/06/2008   Centricity Description: IBS Qualifier: Diagnosis of  By: [name] CMA ([name]), [name]   Centricity Description: IRRITABLE BOWEL SYNDROME Qualifier: Diagnosis of  By: [name] MD, [name]    Liver cirrhosis secondary to NASH ([place]) 09/16/2016   NASH (nonalcoholic steatohepatitis)    Obesity    Portal hypertensive gastropathy ([place])    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 04/20/19: s/p TAVR wtih a 21mm Medtronic CoreValve Evolut Pro via the TF approach   Severe aortic stenosis    Splenomegaly 09/16/2016   Thrombocytopenia (H[facility])    Thyroid disease    Tobacco abuse    Vitamin D deficiency    Past Surgical History:  Procedure Laterality Date   BIOPSY  04/04/2022   Procedure: BIOPSY;  Surgeon: Irving Copas., MD;  Location: [facility];  Service: Gastroenterology;;   CARDIAC CATHETERIZATION     COLONOSCOPY WITH PROPOFOL N/A 04/09/2018   Procedure: COLONOSCOPY WITH PROPOFOL;  Surgeon: [name], MD;  Location: [facility];  Service: Endoscopy;  Laterality: N/A;   COLONOSCOPY WITH PROPOFOL N/A 04/04/2022   Procedure: COLONOSCOPY WITH PROPOFOL;  Surgeon: Rush Landmark Telford Nab., MD;  [facility]: [facility];  Service: Gastroenterology;  Laterality: N/A;   CORONARY ANGIOPLASTY     CORONARY STENT INTERVENTION N/A 02/11/2019   Procedure: CORONARY STENT INTERVENTION;  Surgeon: [name], MD;  Location: [place] [facility];  Service: Cardiovascular;  Laterality: N/A;  Prox RCA Prox LAD   ESOPHAGOGASTRODUODENOSCOPY (EGD) WITH PROPOFOL N/A 04/09/2018   Procedure: ESOPHAGOGASTRODUODENOSCOPY (EGD) WITH PROPOFOL;  Surgeon: [name], MD;  [facility];  Service: Endoscopy;  Laterality: N/A;   ESOPHAGOGASTRODUODENOSCOPY (EGD) WITH PROPOFOL  N/A 04/04/2022   Procedure: ESOPHAGOGASTRODUODENOSCOPY (EGD) WITH PROPOFOL;  Surgeon: Rush Landmark Telford Nab., MD;  Location: [facility];  Service: Gastroenterology;  Laterality: N/A;   oopherectomy     OVARIAN CYST REMOVAL     POLYPECTOMY  04/09/2018   Procedure: POLYPECTOMY;  Surgeon: [name], MD;  [facility];  Service: Endoscopy;;   POLYPECTOMY  04/04/2022   Procedure: POLYPECTOMY;  Surgeon: Irving Copas., MD;  Location: [facility];  Service: Gastroenterology;;   RIGHT/LEFT HEART CATH AND CORONARY ANGIOGRAPHY N/A 10/07/2018   Procedure:  RIGHT/LEFT HEART CATH AND CORONARY ANGIOGRAPHY;  Surgeon: [name], MD;  Location: [place] [facility];  Service: Cardiovascular;  Laterality: N/A;   TEE WITHOUT CARDIOVERSION N/A 04/20/2019   Procedure: TRANSESOPHAGEAL ECHOCARDIOGRAM (TEE);  Surgeon: [name], MD;  Location: [place];  Service: Open Heart Surgery;  Laterality: N/A;   TRANSCATHETER AORTIC VALVE REPLACEMENT, TRANSFEMORAL N/A 04/20/2019   Procedure: TRANSCATHETER AORTIC VALVE REPLACEMENT, TRANSFEMORAL approach using 29mm Evolut PRO+ medtronic valve;  Surgeon: [name], MD;  Location: [place];  Service: Open Heart Surgery;  Laterality: N/A;   Current Outpatient Medications on File Prior to Visit  Medication Sig Dispense Refill   ACCU-CHEK AVIVA PLUS test strip      ACCU-CHEK SOFTCLIX LANCETS lancets      atorvastatin (LIPITOR) 20 MG tablet TAKE 1 TABLET EVERY DAY 90 tablet 3   benzonatate (TESSALON) 200 MG capsule Take 1 capsule (200 mg total) by mouth 3 (three) times daily as needed for cough. (Patient not taking: Reported on 10/28/2022) 30 capsule 0   Blood Glucose Monitoring Suppl (ACCU-CHEK AVIVA PLUS) w/Device KIT      FEROSUL 325 (65 Fe) MG tablet TAKE 1 TABLET EVERY DAY 90 tablet 1   FLUoxetine (PROZAC) 40 MG capsule Take 1 capsule (40 mg total) by mouth daily. 90 capsule 3   furosemide (LASIX) 20 MG tablet TAKE 1 TABLET EVERY DAY 90 tablet 2   furosemide (LASIX) 20 MG tablet Take 2 tablets (40 mg total) by mouth 2 (two) times daily. 20 tablet 0   levothyroxine (SYNTHROID) 88 MCG tablet TAKE 1 TABLET EVERY DAY 90 tablet 10   losartan (COZAAR) 100 MG tablet TAKE 1 TABLET EVERY DAY 90 tablet 1   nadolol (CORGARD) 20 MG tablet Take 0.5 tablets (10 mg total) by mouth daily. 30 tablet 12   oxyCODONE (ROXICODONE) 5 MG immediate release tablet Take 1 tablet (5 mg total) by mouth every 6 (six) hours as needed for severe pain (rib pain). (Patient not taking: Reported on 10/28/2022) 20 tablet 0    pantoprazole (PROTONIX) 40 MG tablet TAKE 1 TABLET EVERY DAY (Patient not taking: Reported on 10/28/2022) 90 tablet 3   potassium chloride (KLOR-CON M) 10 MEQ tablet Take 2 tablets (20 mEq total) by mouth daily. 180 tablet 3   traZODone (DESYREL) 50 MG tablet TAKE 1 TABLET BY MOUTH EVERY DAY AT BEDTIME AS NEEDED (Patient not taking: Reported on 10/28/2022) 90 tablet 2   Current Facility-Administered Medications on File Prior to Visit  Medication Dose Route Frequency Provider Last Rate Last Admin   cyanocobalamin ((VITAMIN B-12)) injection 1,000 mcg  1,000 mcg Subcutaneous Q30 days [name], MD   1,000 mcg at 09/08/17 1408    Allergies  Allergen Reactions   Prednisone Itching    Heart rate increased   Celecoxib Rash   Macrodantin [Nitrofurantoin Macrocrystal] Rash   Social History   Socioeconomic History   Marital status: Married  Spouse name: Not on file   Number of children: 1   Years of education: Not on file   Highest education level: Not on file  Occupational History   Not on file  Tobacco Use   Smoking status: Every Day    Packs/day: .5    Types: Cigarettes   Smokeless tobacco: Never  Vaping Use   Vaping Use: Never used  Substance and Sexual Activity   Alcohol use: No   Drug use: Not Currently    Types: Benzodiazepines   Sexual activity: Not Currently  Other Topics Concern   Not on file  Social History Narrative   Married x 54 years in 2023.   1 son   2 grandsons   Social Determinants of Health   Financial Resource Strain: Low Risk  (07/18/2022)   Overall Financial Resource Strain (CARDIA)    Difficulty of Paying Living Expenses: Not hard at all  Food Insecurity: No Food Insecurity (07/18/2022)   Hunger Vital Sign    Worried About Running Out of Food in the Last Year: Never true    [place] in the Last Year: Never true  Transportation Needs: No Transportation Needs (07/18/2022)   PRAPARE - Hydrologist (Medical):  No    Lack of Transportation (Non-Medical): No  Physical Activity: Inactive (07/18/2022)   Exercise Vital Sign    Days of Exercise per Week: 0 days    Minutes of Exercise per Session: 0 min  Stress: Stress Concern Present (07/18/2022)   [place]    Feeling of Stress : To some extent  Social Connections: Moderately Integrated (07/18/2022)   Social Connection and Isolation Panel [NHANES]    Frequency of Communication with Friends and Family: Three times a week    Frequency of Social Gatherings with Friends and Family: Once a week    Attends Religious Services: 1 to 4 times per year    Active Member of [employer] or Organizations: No    Attends Archivist Meetings: Never    Marital Status: Married  Human resources officer Violence: Not At Risk (07/18/2022)   Humiliation, Afraid, Rape, and Kick questionnaire    Fear of Current or Ex-Partner: No    Emotionally Abused: No    Physically Abused: No    Sexually Abused: No       Review of Systems  Gastrointestinal:  Positive for diarrhea.       Objective:   Physical Exam Constitutional:      Appearance: Normal appearance. She is obese.  Cardiovascular:     Rate and Rhythm: Normal rate and regular rhythm.     Heart sounds: Murmur heard.  Pulmonary:     Effort: Pulmonary effort is normal. No respiratory distress.     Breath sounds: Normal breath sounds. No wheezing, rhonchi or rales.  Abdominal:     General: Bowel sounds are normal. There is distension.     Palpations: Abdomen is soft. There is no mass.     Tenderness: There is no guarding or rebound.  Musculoskeletal:     Right lower leg: Edema present.     Left lower leg: Edema present.  Neurological:     Mental Status: She is alert.          Assessment & Plan:  Cirrhosis of liver without ascites, unspecified hepatic cirrhosis type ([place]) - Plan: COMPLETE METABOLIC PANEL WITH GFR, Protime-INR Patient  has cirrhosis and I believe  was experiencing ascites secondary to cirrhosis.  Decrease Lasix to 40 mg a day.  Have asked the patient to stop her potassium.  In place at this time we will start her on spironolactone 25 mg daily and I will recheck the patient's weight and renal function and potassium in 1 week.  Hopefully we can find a dose of diuretics that will help maintain her fluid balance without causing renal insufficiency

## 2022-11-01 LAB — COMPLETE METABOLIC PANEL WITH GFR
AG Ratio: 1.2 (calc) (ref 1.0–2.5)
ALT: 7 U/L (ref 6–29)
AST: 19 U/L (ref 10–35)
Albumin: 3.1 g/dL — ABNORMAL LOW (ref 3.6–5.1)
Alkaline phosphatase (APISO): 111 U/L (ref 37–153)
BUN: 9 mg/dL (ref 7–25)
CO2: 30 mmol/L (ref 20–32)
Calcium: 8.2 mg/dL — ABNORMAL LOW (ref 8.6–10.4)
Chloride: 104 mmol/L (ref 98–110)
Creat: 0.78 mg/dL (ref 0.60–1.00)
Globulin: 2.6 g/dL (calc) (ref 1.9–3.7)
Glucose, Bld: 90 mg/dL (ref 65–99)
Potassium: 3.1 mmol/L — ABNORMAL LOW (ref 3.5–5.3)
Sodium: 142 mmol/L (ref 135–146)
Total Bilirubin: 1.3 mg/dL — ABNORMAL HIGH (ref 0.2–1.2)
Total Protein: 5.7 g/dL — ABNORMAL LOW (ref 6.1–8.1)
eGFR: 80 mL/min/{1.73_m2} (ref >=60)

## 2022-11-01 LAB — PROTIME-INR
INR: 1.1
Prothrombin Time: 11.7 s — ABNORMAL HIGH (ref 9.0–11.5)

## 2022-11-04 ENCOUNTER — Telehealth: Payer: Self-pay

## 2022-11-04 DIAGNOSIS — E876 Hypokalemia: Secondary | ICD-10-CM

## 2022-11-04 MED ORDER — POTASSIUM CHLORIDE CRYS ER 20 MEQ PO TBCR: 20.0000 meq | EXTENDED_RELEASE_TABLET | Freq: Every day | ORAL | 3 refills | Status: DC

## 2022-11-04 NOTE — Telephone Encounter (Signed)
Spoke with pt regarding labs and instructions.   

## 2022-11-04 NOTE — Telephone Encounter (Signed)
-----   Message from [name], MD sent at 11/04/2022  7:17 AM EDT ----- Potassium was actually low.  In addition to starting spironolactone, I would continue KDur or 20 mill equivalents daily and recheck BMP in 1 week

## 2022-11-07 ENCOUNTER — Ambulatory Visit (INDEPENDENT_AMBULATORY_CARE_PROVIDER_SITE_OTHER): Payer: Medicare HMO | Admitting: Family Medicine

## 2022-11-07 VITALS — BP 128/82 | HR 63 | Temp 97.8°F | Ht 65.0 in | Wt 156.6 lb

## 2022-11-07 DIAGNOSIS — K7460 Unspecified cirrhosis of liver: Secondary | ICD-10-CM

## 2022-11-07 DIAGNOSIS — K7581 Nonalcoholic steatohepatitis (NASH): Secondary | ICD-10-CM | POA: Diagnosis not present

## 2022-11-07 NOTE — Progress Notes (Signed)
Subjective:    Patient ID: Kelly [name], female    DOB: 01-17-1951, 72 y.o.   MRN: [id number]   10/31/22  I saw the patient March 12.  She weighed 157 pounds.  My partner saw her on March 25 and her weight had increased to 178 pounds.  She was having ascites in her abdomen as well as pitting edema in her extremities.  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  She typically takes Lasix 20 mg a day along with potassium.  My partner put her on Lasix 40 mg twice daily and she is here today for follow-up.  This was 3 days ago.  The patient has diuresed 13 pounds and her weight is down to 165.  I believe her dry weight is closer to 157.  She denies any cramps.  She denies any dizziness.  She denies any lightheadedness.  At that time, my plan was: Patient has cirrhosis and I believe was experiencing ascites secondary to cirrhosis.  Decrease Lasix to 40 mg a day.  Have asked the patient to stop her potassium.  In place at this time we will start her on spironolactone 25 mg daily and I will recheck the patient's weight and renal function and potassium in 1 week.  Hopefully we can find a dose of diuretics that will help maintain her fluid balance without causing renal insufficiency  11/07/22 Wt Readings from Last 3 Encounters:  11/07/22 156 lb 9.6 oz (71 kg)  10/31/22 165 lb (74.8 kg)  10/28/22 178 lb (80.7 kg)    Patient has diuresed approximately another 10 pounds.  She reports feeling tired and more sleepy.  She denies any shortness of breath or chest pain or abdominal pain.  Last time her bilirubin was slightly elevated 1.3 however her INR was normal and her PTT was markedly elevated.  Therefore MELD score is low..  Today on exam there is no ascites.  There is no pitting edema.  Her blood pressure stable.  She does not appear dehydrated.  She is not tachycardic Past Medical History:  Diagnosis Date   Anxiety    Chronic upper GI bleeding    intestinal AVM's,  portal gastropathy, esophageal varices on egd 04/2018   Coronary artery disease involving native coronary artery without angina pectoris    Dementia ([place]) 09/16/2016   Depression    DIVERTICULITIS OF COLON 06/14/2008   Qualifier: Diagnosis of  By: Gretta Cool RN, Malachy Mood     DM (diabetes mellitus), type 2 ([place]) 09/15/2016   Esophageal varices determined by endoscopy ([place])    Hyperlipidemia    Hypertension    Hypothyroidism    Incidental pulmonary nodule, greater than or equal to 29mm 10/14/2018   LLL nodule needs f/u CT or PET-CT scan   Irritable bowel syndrome 04/06/2008   Centricity Description: IBS Qualifier: Diagnosis of  By: [name] CMA ([name]), [name]   Centricity Description: IRRITABLE BOWEL SYNDROME Qualifier: Diagnosis of  By: [name] MD, [name]    Liver cirrhosis secondary to NASH ([place]) 09/16/2016   NASH (nonalcoholic steatohepatitis)    Obesity    Portal hypertensive gastropathy ([place])    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 04/20/19: s/p TAVR wtih a 22mm Medtronic CoreValve Evolut Pro via the TF approach   Severe aortic stenosis    Splenomegaly 09/16/2016   Thrombocytopenia (H[facility])    Thyroid disease    Tobacco abuse    Vitamin D deficiency  Past Surgical History:  Procedure Laterality Date   BIOPSY  04/04/2022   Procedure: BIOPSY;  Surgeon: Rush Landmark Telford Nab., MD;  Location: [facility];  Service: Gastroenterology;;   CARDIAC CATHETERIZATION     COLONOSCOPY WITH PROPOFOL N/A 04/09/2018   Procedure: COLONOSCOPY WITH PROPOFOL;  Surgeon: [name], MD;  [facility];  Service: Endoscopy;  Laterality: N/A;   COLONOSCOPY WITH PROPOFOL N/A 04/04/2022   Procedure: COLONOSCOPY WITH PROPOFOL;  Surgeon: Rush Landmark Telford Nab., MD;  [facility];  Service: Gastroenterology;  Laterality: N/A;   CORONARY ANGIOPLASTY     CORONARY STENT INTERVENTION N/A 02/11/2019   Procedure: CORONARY STENT INTERVENTION;  Surgeon: [name], MD;   Location: [place] [facility];  Service: Cardiovascular;  Laterality: N/A;  Prox RCA Prox LAD   ESOPHAGOGASTRODUODENOSCOPY (EGD) WITH PROPOFOL N/A 04/09/2018   Procedure: ESOPHAGOGASTRODUODENOSCOPY (EGD) WITH PROPOFOL;  Surgeon: [name], MD;  [facility];  Service: Endoscopy;  Laterality: N/A;   ESOPHAGOGASTRODUODENOSCOPY (EGD) WITH PROPOFOL N/A 04/04/2022   Procedure: ESOPHAGOGASTRODUODENOSCOPY (EGD) WITH PROPOFOL;  Surgeon: Rush Landmark Telford Nab., MD;  [facility];  Service: Gastroenterology;  Laterality: N/A;   oopherectomy     OVARIAN CYST REMOVAL     POLYPECTOMY  04/09/2018   Procedure: POLYPECTOMY;  Surgeon: [name], MD;  [facility];  Service: Endoscopy;;   POLYPECTOMY  04/04/2022   Procedure: POLYPECTOMY;  Surgeon: Irving Copas., MD;  Location: [facility];  Service: Gastroenterology;;   RIGHT/LEFT HEART CATH AND CORONARY ANGIOGRAPHY N/A 10/07/2018   Procedure: RIGHT/LEFT HEART CATH AND CORONARY ANGIOGRAPHY;  Surgeon: [name], MD;  Location: [place] [facility];  Service: Cardiovascular;  Laterality: N/A;   TEE WITHOUT CARDIOVERSION N/A 04/20/2019   Procedure: TRANSESOPHAGEAL ECHOCARDIOGRAM (TEE);  Surgeon: [name], MD;  Location: [place];  Service: Open Heart Surgery;  Laterality: N/A;   TRANSCATHETER AORTIC VALVE REPLACEMENT, TRANSFEMORAL N/A 04/20/2019   Procedure: TRANSCATHETER AORTIC VALVE REPLACEMENT, TRANSFEMORAL approach using 57mm Evolut PRO+ medtronic valve;  Surgeon: [name], MD;  Location: [facility];  Service: Open Heart Surgery;  Laterality: N/A;   Current Outpatient Medications on File Prior to Visit  Medication Sig Dispense Refill   ACCU-CHEK AVIVA PLUS test strip      ACCU-CHEK SOFTCLIX LANCETS lancets      atorvastatin (LIPITOR) 20 MG tablet TAKE 1 TABLET EVERY DAY 90 tablet 3   Blood Glucose Monitoring Suppl (ACCU-CHEK AVIVA PLUS) w/Device KIT      FEROSUL 325 (65 Fe) MG  tablet TAKE 1 TABLET EVERY DAY 90 tablet 1   FLUoxetine (PROZAC) 40 MG capsule Take 1 capsule (40 mg total) by mouth daily. 90 capsule 3   furosemide (LASIX) 40 MG tablet Take 1 tablet (40 mg total) by mouth daily. 30 tablet 3   levothyroxine (SYNTHROID) 88 MCG tablet TAKE 1 TABLET EVERY DAY 90 tablet 10   losartan (COZAAR) 100 MG tablet TAKE 1 TABLET EVERY DAY 90 tablet 1   nadolol (CORGARD) 20 MG tablet Take 0.5 tablets (10 mg total) by mouth daily. 30 tablet 12   oxyCODONE (ROXICODONE) 5 MG immediate release tablet Take 1 tablet (5 mg total) by mouth every 6 (six) hours as needed for severe pain (rib pain). 20 tablet 0   pantoprazole (PROTONIX) 40 MG tablet TAKE 1 TABLET EVERY DAY 90 tablet 3   potassium chloride SA (KLOR-CON M) 20 MEQ tablet Take 1 tablet (20 mEq total) by mouth daily. 30 tablet 3  spironolactone (ALDACTONE) 25 MG tablet Take 1 tablet (25 mg total) by mouth daily. 90 tablet 3   traZODone (DESYREL) 50 MG tablet TAKE 1 TABLET BY MOUTH EVERY DAY AT BEDTIME AS NEEDED 90 tablet 2   Current Facility-Administered Medications on File Prior to Visit  Medication Dose Route Frequency Provider Last Rate Last Admin   cyanocobalamin ((VITAMIN B-12)) injection 1,000 mcg  1,000 mcg Subcutaneous Q30 days [name], MD   1,000 mcg at 09/08/17 1408    Allergies  Allergen Reactions   Prednisone Itching    Heart rate increased   Celecoxib Rash   Macrodantin [Nitrofurantoin Macrocrystal] Rash   Social History   Socioeconomic History   Marital status: Married    Spouse name: Not on file   Number of children: 1   Years of education: Not on file   Highest education level: Not on file  Occupational History   Not on file  Tobacco Use   Smoking status: Every Day    Packs/day: .5    Types: Cigarettes   Smokeless tobacco: Never  Vaping Use   Vaping Use: Never used  Substance and Sexual Activity   Alcohol use: No   Drug use: Not Currently    Types: Benzodiazepines   Sexual  activity: Not Currently  Other Topics Concern   Not on file  Social History Narrative   Married x 2 years in 2023.   1 son   2 grandsons   Social Determinants of Health   Financial Resource Strain: Low Risk  (07/18/2022)   Overall Financial Resource Strain (CARDIA)    Difficulty of Paying Living Expenses: Not hard at all  Food Insecurity: No Food Insecurity (07/18/2022)   Hunger Vital Sign    Worried About Running Out of Food in the Last Year: Never true    [place] in the Last Year: Never true  Transportation Needs: No Transportation Needs (07/18/2022)   PRAPARE - Hydrologist (Medical): No    Lack of Transportation (Non-Medical): No  Physical Activity: Inactive (07/18/2022)   Exercise Vital Sign    Days of Exercise per Week: 0 days    Minutes of Exercise per Session: 0 min  Stress: Stress Concern Present (07/18/2022)   [place]    Feeling of Stress : To some extent  Social Connections: Moderately Integrated (07/18/2022)   Social Connection and Isolation Panel [NHANES]    Frequency of Communication with Friends and Family: Three times a week    Frequency of Social Gatherings with Friends and Family: Once a week    Attends Religious Services: 1 to 4 times per year    Active Member of [employer] or Organizations: No    Attends Archivist Meetings: Never    Marital Status: Married  Human resources officer Violence: Not At Risk (07/18/2022)   Humiliation, Afraid, Rape, and Kick questionnaire    Fear of Current or Ex-Partner: No    Emotionally Abused: No    Physically Abused: No    Sexually Abused: No       Review of Systems  Gastrointestinal:  Positive for diarrhea.       Objective:   Physical Exam Constitutional:      Appearance: Normal appearance. She is obese.  Cardiovascular:     Rate and Rhythm: Normal rate and regular rhythm.     Heart sounds: Murmur  heard.  Pulmonary:     Effort:  Pulmonary effort is normal. No respiratory distress.     Breath sounds: Normal breath sounds. No wheezing, rhonchi or rales.  Abdominal:     General: Bowel sounds are normal. There is no distension.     Palpations: Abdomen is soft. There is no mass.     Tenderness: There is no guarding or rebound.  Musculoskeletal:     Right lower leg: No edema.     Left lower leg: No edema.  Neurological:     Mental Status: She is alert.          Assessment & Plan:  Liver cirrhosis secondary to NASH - Plan: COMPLETE METABOLIC PANEL WITH GFR, Ammonia Patient appears euvolemic today.  Decrease her Lasix to 20 mg a day and decrease spironolactone to 12.5 mg a day.  Check potassium level.  Patient currently taking 20 mEq of potassium daily.  Titrate as necessary.  Check an ammonia level given her increased feeling of drowsiness and tiredness.  If elevated, I will institute lactulose.  If normal, I have recommended stopping trazodone and avoiding centrally acting substances

## 2022-11-08 LAB — COMPLETE METABOLIC PANEL WITH GFR
AG Ratio: 1.1 (calc) (ref 1.0–2.5)
ALT: 8 U/L (ref 6–29)
AST: 21 U/L (ref 10–35)
Albumin: 3.2 g/dL — ABNORMAL LOW (ref 3.6–5.1)
Alkaline phosphatase (APISO): 136 U/L (ref 37–153)
BUN: 7 mg/dL (ref 7–25)
CO2: 27 mmol/L (ref 20–32)
Calcium: 8.8 mg/dL (ref 8.6–10.4)
Chloride: 108 mmol/L (ref 98–110)
Creat: 0.71 mg/dL (ref 0.60–1.00)
Globulin: 2.8 g/dL (calc) (ref 1.9–3.7)
Glucose, Bld: 90 mg/dL (ref 65–99)
Potassium: 3.5 mmol/L (ref 3.5–5.3)
Sodium: 142 mmol/L (ref 135–146)
Total Bilirubin: 1.6 mg/dL — ABNORMAL HIGH (ref 0.2–1.2)
Total Protein: 6 g/dL — ABNORMAL LOW (ref 6.1–8.1)
eGFR: 89 mL/min/{1.73_m2} (ref >=60)

## 2022-11-08 LAB — AMMONIA: Ammonia: 62 umol/L (ref <=72)

## 2022-11-11 ENCOUNTER — Telehealth: Payer: Self-pay | Admitting: Pharmacist

## 2022-11-11 NOTE — Progress Notes (Signed)
Care Management & Coordination Services Pharmacy Team  Reason for Encounter: Hypertension  Contacted patient to discuss hypertension disease state. Spoke with patient on 11/11/2022     Current antihypertensive regimen:  Furosemide 20 mg daily Losartan 100 mg daily Spironolactone 12.5 mg daily  Patient verbally confirms she is taking the above medications as directed. Yes  How often are you checking your Blood Pressure? daily  she checks her blood pressure in the middle of the day after taking her medication.  Current home BP readings: 118/51   Wrist or arm cuff: arm Caffeine intake: drinks decaff Salt intake: patient states she doesn't limit salt intake  Any readings above 180/100? No If yes any symptoms of hypertensive emergency? patient denies any symptoms of high blood pressure  What recent interventions/DTPs have been made by any provider to improve Blood Pressure control since last CPP Visit:   Any recent hospitalizations or ED visits since last visit with CPP? No  What diet changes have been made to improve Blood Pressure Control?  Patient states her appetite has decreased and is not eating as much as she used to.  What exercise is being done to improve your Blood Pressure Control?  Patient states she is very active at home taking care of her husband.  Patient states she has lost 10 lbs since her last visit with Dr. Tanya Nones.  Adherence Review: Is the patient currently on ACE/ARB medication? Yes Does the patient have >5 day gap between last estimated fill dates? Yes - patient states she had extra Losartan on hand. She denies running out of the medication or missing any doses.  Star Rating Drugs:  Atorvastatin 20 mg last filled 08/09/2022 90 DS Losartan 100 mg last filled 05/07/2022 90 DS   Chart Updates: Recent office visits:  11/07/2022 OV (PCP) Donita Brooks, MD; Decrease her Lasix to 20 mg a day and decrease spironolactone to 12.5 mg a day.   10/31/2022 OV  (PCP) Donita Brooks, MD; Decrease Lasix to 40 mg a day. Have asked the patient to stop her potassium. In place at this time we will start her on spironolactone 25 mg daily and I will recheck the patient's weight and renal function and potassium in 1 week.   10/28/2022 OV (Fam Med) Park Meo, FNP; Patient has gained 19 pounds in 13 days. She does report increased fluid intake due to being sick. 3+ pitting edema to BLE. Non-pitting protuberant abdomen. Will increase Lasix to 40mg  BID and draw CMP. Will have her return to office for weight and labs in 3 days.   10/15/2022 OV (PCP) Donita Brooks, MD; believe she likely had an upper respiratory infection I recommended Tessalon Perles 200 mg every 8 hours as needed for cough I believe she likely cracked a rib or bruised rib when she fell out of bed. I will treat that symptomatically with oxycodone 5 mg only at night to help her rest.   09/25/2022 OV (Fam Med) Park Meo, FNP; I am concerned about progression to pneumonia given the coarse lung sounds, chills, and malaise. Will order Z-pak for 5 days.   Recent consult visits:  None  Hospital visits:  None in previous 6 months  Medications: Outpatient Encounter Medications as of 11/11/2022  Medication Sig   ACCU-CHEK AVIVA PLUS test strip    ACCU-CHEK SOFTCLIX LANCETS lancets    atorvastatin (LIPITOR) 20 MG tablet TAKE 1 TABLET EVERY DAY   Blood Glucose Monitoring Suppl (ACCU-CHEK AVIVA PLUS) w/Device KIT  FEROSUL 325 (65 Fe) MG tablet TAKE 1 TABLET EVERY DAY   FLUoxetine (PROZAC) 40 MG capsule Take 1 capsule (40 mg total) by mouth daily.   furosemide (LASIX) 40 MG tablet Take 1 tablet (40 mg total) by mouth daily.   levothyroxine (SYNTHROID) 88 MCG tablet TAKE 1 TABLET EVERY DAY   losartan (COZAAR) 100 MG tablet TAKE 1 TABLET EVERY DAY   nadolol (CORGARD) 20 MG tablet Take 0.5 tablets (10 mg total) by mouth daily.   oxyCODONE (ROXICODONE) 5 MG immediate release tablet Take 1 tablet  (5 mg total) by mouth every 6 (six) hours as needed for severe pain (rib pain).   pantoprazole (PROTONIX) 40 MG tablet TAKE 1 TABLET EVERY DAY   potassium chloride SA (KLOR-CON M) 20 MEQ tablet Take 1 tablet (20 mEq total) by mouth daily.   spironolactone (ALDACTONE) 25 MG tablet Take 1 tablet (25 mg total) by mouth daily.   traZODone (DESYREL) 50 MG tablet TAKE 1 TABLET BY MOUTH EVERY DAY AT BEDTIME AS NEEDED   Facility-Administered Encounter Medications as of 11/11/2022  Medication   cyanocobalamin ((VITAMIN B-12)) injection 1,000 mcg    Recent Office Vitals: BP Readings from Last 3 Encounters:  11/07/22 128/82  10/31/22 120/62  10/28/22 (!) 160/68   Pulse Readings from Last 3 Encounters:  11/07/22 63  10/31/22 (!) 51  10/28/22 97    Wt Readings from Last 3 Encounters:  11/07/22 156 lb 9.6 oz (71 kg)  10/31/22 165 lb (74.8 kg)  10/28/22 178 lb (80.7 kg)     Kidney Function Lab Results  Component Value Date/Time   CREATININE 0.71 11/07/2022 01:13 PM   CREATININE 0.78 10/31/2022 11:13 AM   GFR 91.17 06/19/2022 09:27 AM   GFRNONAA 89 01/18/2021 12:19 PM   GFRAA 103 01/18/2021 12:19 PM       Latest Ref Rng & Units 11/07/2022    1:13 PM 10/31/2022   11:13 AM 10/28/2022    4:09 PM  BMP  Glucose 65 - 99 mg/dL 90  90  90   BUN 7 - 25 mg/dL 7  9  6    Creatinine 0.60 - 1.00 mg/dL 0.10  9.32  3.55   BUN/Creat Ratio 6 - 22 (calc) SEE NOTE:  SEE NOTE:  9   Sodium 135 - 146 mmol/L 142  142  140   Potassium 3.5 - 5.3 mmol/L 3.5  3.1  4.2   Chloride 98 - 110 mmol/L 108  104  108   CO2 20 - 32 mmol/L 27  30  26    Calcium 8.6 - 10.4 mg/dL 8.8  8.2  8.5      Future Appointments  Date Time Provider Department Center  01/16/2023 10:15 AM Donita Brooks, MD [facility]  07/24/2023 12:00 PM [facility]NUAL WELLNESS VISIT [facility]    April D Calhoun, [facility] Clinical Pharmacist Assistant [phone]

## 2022-11-15 ENCOUNTER — Ambulatory Visit: Payer: Medicare HMO | Admitting: Family Medicine

## 2022-12-02 ENCOUNTER — Ambulatory Visit: Payer: Medicare HMO | Admitting: Cardiovascular Disease

## 2022-12-16 ENCOUNTER — Telehealth: Payer: Self-pay

## 2022-12-16 ENCOUNTER — Ambulatory Visit (INDEPENDENT_AMBULATORY_CARE_PROVIDER_SITE_OTHER): Payer: Medicare HMO | Admitting: Family Medicine

## 2022-12-16 ENCOUNTER — Encounter: Payer: Self-pay | Admitting: Family Medicine

## 2022-12-16 VITALS — BP 188/80 | HR 66 | Temp 97.8°F | Resp 16 | Wt 174.0 lb

## 2022-12-16 DIAGNOSIS — K7460 Unspecified cirrhosis of liver: Secondary | ICD-10-CM

## 2022-12-16 NOTE — Progress Notes (Signed)
Subjective:    Patient ID: Kelly [name], female    DOB: Jan 25, 1951, 72 y.o.   MRN: [id number]   10/31/22  I saw the patient March 12.  She weighed 157 pounds.  My partner saw her on March 25 and her weight had increased to 178 pounds.  She was having ascites in her abdomen as well as pitting edema in her extremities.  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  She typically takes Lasix 20 mg a day along with potassium.  My partner put her on Lasix 40 mg twice daily and she is here today for follow-up.  This was 3 days ago.  The patient has diuresed 13 pounds and her weight is down to 165.  I believe her dry weight is closer to 157.  She denies any cramps.  She denies any dizziness.  She denies any lightheadedness.  At that time, my plan was: Patient has cirrhosis and I believe was experiencing ascites secondary to cirrhosis.  Decrease Lasix to 40 mg a day.  Have asked the patient to stop her potassium.  In place at this time we will start her on spironolactone 25 mg daily and I will recheck the patient's weight and renal function and potassium in 1 week.  Hopefully we can find a dose of diuretics that will help maintain her fluid balance without causing renal insufficiency  11/07/22 Wt Readings from Last 3 Encounters:  11/07/22 156 lb 9.6 oz (71 kg)  10/31/22 165 lb (74.8 kg)  10/28/22 178 lb (80.7 kg)    Patient has diuresed approximately another 10 pounds.  She reports feeling tired and more sleepy.  She denies any shortness of breath or chest pain or abdominal pain.  Last time her bilirubin was slightly elevated 1.3 however her INR was normal and her PTT was markedly elevated.  Therefore MELD score is low..  Today on exam there is no ascites.  There is no pitting edema.  Her blood pressure stable.  She does not appear dehydrated.  She is not tachycardic.  At that time, my plan was:  Patient appears euvolemic today.  Decrease her Lasix to 20 mg a day and  decrease spironolactone to 12.5 mg a day.  Check potassium level.  Patient currently taking 20 mEq of potassium daily.  Titrate as necessary.  Check an ammonia level given her increased feeling of drowsiness and tiredness.  If elevated, I will institute lactulose.  If normal, I have recommended stopping trazodone and avoiding centrally acting substances  12/16/22 The last saw the patient, she is managing her medicines.  She is uncertain of what she is taking.  She thinks she is taking Lasix 40 mg a day and spironolactone 25 mg a day but she is uncertain.  However her weight has exploded.  Her weight is up to 174 pounds today Wt Readings from Last 3 Encounters:  12/16/22 174 lb (78.9 kg)  11/07/22 156 lb 9.6 oz (71 kg)  10/31/22 165 lb (74.8 kg)   Her abdomen is distended.  She has pitting edema in her extremities.  She denies any chest pain or shortness of breath.  However she does report that her abdomen is uncomfortable due to the distention.  Her blood pressure significantly elevated Past Medical History:  Diagnosis Date   Anxiety    Chronic upper GI bleeding    intestinal AVM's, portal gastropathy, esophageal varices on egd 04/2018   Coronary artery disease involving native  coronary artery without angina pectoris    Dementia (HCC) 09/16/2016   Depression    DIVERTICULITIS OF COLON 06/14/2008   Qualifier: Diagnosis of  By: Nehemiah Massed RN, Elnita Maxwell     DM (diabetes mellitus), type 2 (HCC) 09/15/2016   Esophageal varices determined by endoscopy (HCC)    Hyperlipidemia    Hypertension    Hypothyroidism    Incidental pulmonary nodule, greater than or equal to 8mm 10/14/2018   LLL nodule needs f/u CT or PET-CT scan   Irritable bowel syndrome 04/06/2008   Centricity Description: IBS Qualifier: Diagnosis of  By: Melvyn Neth CMA (Duncan Dull), [name]   Centricity Description: IRRITABLE BOWEL SYNDROME Qualifier: Diagnosis of  By: Christella Hartigan MD, Melton Alar    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 04/20/19: s/p TAVR wtih a 23mm Medtronic CoreValve Evolut Pro via the TF approach   Severe aortic stenosis    Splenomegaly 09/16/2016   Thrombocytopenia (HCC)    Thyroid disease    Tobacco abuse    Vitamin D deficiency    Past Surgical History:  Procedure Laterality Date   BIOPSY  04/04/2022   Procedure: BIOPSY;  Surgeon: Lemar Lofty., MD;  Location: [facility];  Service: Gastroenterology;;   CARDIAC CATHETERIZATION     COLONOSCOPY WITH PROPOFOL N/A 04/09/2018   Procedure: COLONOSCOPY WITH PROPOFOL;  Surgeon: Rachael Fee, MD;  Location: [facility];  Service: Endoscopy;  Laterality: N/A;   COLONOSCOPY WITH PROPOFOL N/A 04/04/2022   Procedure: COLONOSCOPY WITH PROPOFOL;  Surgeon: Meridee Score Netty Starring., MD;  Location: [facility];  Service: Gastroenterology;  Laterality: N/A;   CORONARY ANGIOPLASTY     CORONARY STENT INTERVENTION N/A 02/11/2019   Procedure: CORONARY STENT INTERVENTION;  Surgeon: Kathleene Hazel, MD;  Location: [facility];  Service: Cardiovascular;  Laterality: N/A;  Prox RCA Prox LAD   ESOPHAGOGASTRODUODENOSCOPY (EGD) WITH PROPOFOL N/A 04/09/2018   Procedure: ESOPHAGOGASTRODUODENOSCOPY (EGD) WITH PROPOFOL;  Surgeon: Rachael Fee, MD;  Location: [facility];  Service: Endoscopy;  Laterality: N/A;   ESOPHAGOGASTRODUODENOSCOPY (EGD) WITH PROPOFOL N/A 04/04/2022   Procedure: ESOPHAGOGASTRODUODENOSCOPY (EGD) WITH PROPOFOL;  Surgeon: Meridee Score Netty Starring., MD;  Location: [facility];  Service: Gastroenterology;  Laterality: N/A;   oopherectomy     OVARIAN CYST REMOVAL     POLYPECTOMY  04/09/2018   Procedure: POLYPECTOMY;  Surgeon: Rachael Fee, MD;  Location: [facility];  Service: Endoscopy;;   POLYPECTOMY  04/04/2022   Procedure: POLYPECTOMY;  Surgeon: Lemar Lofty., MD;  Location: [facility];  Service: Gastroenterology;;   RIGHT/LEFT HEART CATH AND CORONARY ANGIOGRAPHY N/A 10/07/2018   Procedure: RIGHT/LEFT HEART CATH AND CORONARY ANGIOGRAPHY;  Surgeon: Kathleene Hazel, MD;  Location: [facility];  Service: Cardiovascular;  Laterality: N/A;   TEE WITHOUT CARDIOVERSION N/A 04/20/2019   Procedure: TRANSESOPHAGEAL ECHOCARDIOGRAM (TEE);  Surgeon: Kathleene Hazel, MD;  [facility];  Service: Open Heart Surgery;  Laterality: N/A;   TRANSCATHETER AORTIC VALVE REPLACEMENT, TRANSFEMORAL N/A 04/20/2019   Procedure: TRANSCATHETER AORTIC VALVE REPLACEMENT, TRANSFEMORAL approach using 23mm Evolut PRO+ medtronic valve;  Surgeon: Kathleene Hazel, MD;  Location: [facility];  Service: Open Heart Surgery;  Laterality: N/A;   Current Outpatient Medications on File Prior to Visit  Medication Sig Dispense Refill   ACCU-CHEK AVIVA PLUS test strip      ACCU-CHEK  SOFTCLIX LANCETS lancets      atorvastatin (LIPITOR) 20 MG tablet TAKE 1 TABLET EVERY DAY 90 tablet 3   Blood Glucose Monitoring Suppl (ACCU-CHEK AVIVA PLUS) w/Device KIT      FEROSUL 325 (65 Fe) MG tablet TAKE 1 TABLET EVERY DAY 90 tablet 1   FLUoxetine (PROZAC) 40 MG capsule Take 1 capsule (40 mg total) by mouth daily. 90 capsule 3   furosemide (LASIX) 40 MG tablet Take 1 tablet (40 mg total) by mouth daily. 30 tablet 3   levothyroxine (SYNTHROID) 88 MCG tablet TAKE 1 TABLET EVERY DAY 90 tablet 10   losartan (COZAAR) 100 MG tablet TAKE 1 TABLET EVERY DAY 90 tablet 1   nadolol (CORGARD) 20 MG tablet Take 0.5 tablets (10 mg total) by mouth daily. 30 tablet 12   oxyCODONE (ROXICODONE) 5 MG immediate release tablet Take 1 tablet (5 mg total) by mouth every 6 (six) hours as needed for severe pain (rib pain). 20 tablet 0   pantoprazole (PROTONIX) 40 MG tablet TAKE 1 TABLET EVERY DAY 90 tablet 3   potassium chloride SA (KLOR-CON M) 20 MEQ tablet Take 1 tablet (20 mEq total) by mouth daily. 30 tablet 3    spironolactone (ALDACTONE) 25 MG tablet Take 1 tablet (25 mg total) by mouth daily. 90 tablet 3   traZODone (DESYREL) 50 MG tablet TAKE 1 TABLET BY MOUTH EVERY DAY AT BEDTIME AS NEEDED 90 tablet 2   Current Facility-Administered Medications on File Prior to Visit  Medication Dose Route Frequency Provider Last Rate Last Admin   cyanocobalamin ((VITAMIN B-12)) injection 1,000 mcg  1,000 mcg Subcutaneous Q30 days Donita Brooks, MD   1,000 mcg at 09/08/17 1408    Allergies  Allergen Reactions   Prednisone Itching    Heart rate increased   Celecoxib Rash   Macrodantin [Nitrofurantoin Macrocrystal] Rash   Social History   Socioeconomic History   Marital status: Married    Spouse name: Not on file   Number of children: 1   Years of education: Not on file   Highest education level: Not on file  Occupational History   Not on file  Tobacco Use   Smoking status: Every Day    Packs/day: .5    Types: Cigarettes   Smokeless tobacco: Never  Vaping Use   Vaping Use: Never used  Substance and Sexual Activity   Alcohol use: No   Drug use: Not Currently    Types: Benzodiazepines   Sexual activity: Not Currently  Other Topics Concern   Not on file  Social History Narrative   Married x 53 years in 2023.   1 son   2 grandsons   Social Determinants of Health   Financial Resource Strain: Low Risk  (07/18/2022)   Overall Financial Resource Strain (CARDIA)    Difficulty of Paying Living Expenses: Not hard at all  Food Insecurity: No Food Insecurity (07/18/2022)   Hunger Vital Sign    Worried About Running Out of Food in the Last Year: Never true    Ran Out of Food in the Last Year: Never true  Transportation Needs: No Transportation Needs (07/18/2022)   PRAPARE - Administrator, Civil Service (Medical): No    Lack of Transportation (Non-Medical): No  Physical Activity: Inactive (07/18/2022)   Exercise Vital Sign    Days of Exercise per Week: 0 days    Minutes of  Exercise per Session: 0 min  Stress: Stress Concern Present (07/18/2022)  [employer] of Occupational Health - Occupational Stress Questionnaire    Feeling of Stress : To some extent  Social Connections: Moderately Integrated (07/18/2022)   Social Connection and Isolation Panel [NHANES]    Frequency of Communication with Friends and Family: Three times a week    Frequency of Social Gatherings with Friends and Family: Once a week    Attends Religious Services: 1 to 4 times per year    Active Member of [employer] or Organizations: No    Attends Banker Meetings: Never    Marital Status: Married  Catering manager Violence: Not At Risk (07/18/2022)   Humiliation, Afraid, Rape, and Kick questionnaire    Fear of Current or Ex-Partner: No    Emotionally Abused: No    Physically Abused: No    Sexually Abused: No       Review of Systems  Gastrointestinal:  Positive for diarrhea.       Objective:   Physical Exam Constitutional:      Appearance: Normal appearance. She is obese.  Cardiovascular:     Rate and Rhythm: Normal rate and regular rhythm.     Heart sounds: Murmur heard.  Pulmonary:     Effort: Pulmonary effort is normal. No respiratory distress.     Breath sounds: Normal breath sounds. No wheezing, rhonchi or rales.  Abdominal:     General: Bowel sounds are normal. There is distension.     Palpations: Abdomen is soft. There is no mass.     Tenderness: There is no guarding or rebound.  Musculoskeletal:     Right lower leg: Edema present.     Left lower leg: Edema present.  Neurological:     Mental Status: She is alert.          Assessment & Plan:  Cirrhosis of liver without ascites, unspecified hepatic cirrhosis type (HCC) - Plan: COMPLETE METABOLIC PANEL WITH GFR Patient appears fluid overloaded.  Unfortunately I am not certain of the dose of the fluid pill that she is taking.  If she is taking Lasix 40 mg daily I will to increase that to 40 mg twice  daily and increase spironolactone to 25 mg twice daily.  However I passed the pill call and verify exactly what she is taking so that I can uptitrate the dose appropriately.  Also to recheck her blood pressure and her weight on Friday to ensure that she is diuresing appropriately and her blood pressure is coming down

## 2022-12-16 NOTE — Telephone Encounter (Signed)
Pt called in wanting to make pcp aware of the dosage that she is currently taking:  furosemide (LASIX) 40 MG tablet spironolactone (ALDACTONE) 25 MG tablet [[id number]],  Pt also had a question about this med nadolol (CORGARD) 20 MG tablet [[id number]]. Pt wanted to know what this med is for?  Cb#: [phone]

## 2022-12-17 LAB — COMPLETE METABOLIC PANEL WITH GFR
AG Ratio: 1 (calc) (ref 1.0–2.5)
ALT: 9 U/L (ref 6–29)
AST: 21 U/L (ref 10–35)
Albumin: 3 g/dL — ABNORMAL LOW (ref 3.6–5.1)
Alkaline phosphatase (APISO): 165 U/L — ABNORMAL HIGH (ref 37–153)
BUN: 9 mg/dL (ref 7–25)
CO2: 25 mmol/L (ref 20–32)
Calcium: 8.7 mg/dL (ref 8.6–10.4)
Chloride: 111 mmol/L — ABNORMAL HIGH (ref 98–110)
Creat: 0.75 mg/dL (ref 0.60–1.00)
Globulin: 2.9 g/dL (calc) (ref 1.9–3.7)
Glucose, Bld: 95 mg/dL (ref 65–99)
Potassium: 4.6 mmol/L (ref 3.5–5.3)
Sodium: 142 mmol/L (ref 135–146)
Total Bilirubin: 1.9 mg/dL — ABNORMAL HIGH (ref 0.2–1.2)
Total Protein: 5.9 g/dL — ABNORMAL LOW (ref 6.1–8.1)
eGFR: 83 mL/min/{1.73_m2} (ref >=60)

## 2022-12-20 ENCOUNTER — Ambulatory Visit (INDEPENDENT_AMBULATORY_CARE_PROVIDER_SITE_OTHER): Payer: Medicare HMO | Admitting: Family Medicine

## 2022-12-20 ENCOUNTER — Encounter: Payer: Self-pay | Admitting: Family Medicine

## 2022-12-20 VITALS — BP 124/72 | HR 76 | Temp 98.3°F | Ht 65.0 in | Wt 167.4 lb

## 2022-12-20 DIAGNOSIS — K7581 Nonalcoholic steatohepatitis (NASH): Secondary | ICD-10-CM

## 2022-12-20 DIAGNOSIS — K7460 Unspecified cirrhosis of liver: Secondary | ICD-10-CM | POA: Diagnosis not present

## 2022-12-20 NOTE — Progress Notes (Signed)
Subjective:    Patient ID: Kelly [name], female    DOB: 1950-11-20, 72 y.o.   MRN: [id number]   10/31/22  I saw the patient March 12.  She weighed 157 pounds.  My partner saw her on March 25 and her weight had increased to 178 pounds.  She was having ascites in her abdomen as well as pitting edema in her extremities.  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  She typically takes Lasix 20 mg a day along with potassium.  My partner put her on Lasix 40 mg twice daily and she is here today for follow-up.  This was 3 days ago.  The patient has diuresed 13 pounds and her weight is down to 165.  I believe her dry weight is closer to 157.  She denies any cramps.  She denies any dizziness.  She denies any lightheadedness.  At that time, my plan was: Patient has cirrhosis and I believe was experiencing ascites secondary to cirrhosis.  Decrease Lasix to 40 mg a day.  Have asked the patient to stop her potassium.  In place at this time we will start her on spironolactone 25 mg daily and I will recheck the patient's weight and renal function and potassium in 1 week.  Hopefully we can find a dose of diuretics that will help maintain her fluid balance without causing renal insufficiency  11/07/22 Wt Readings from Last 3 Encounters:  12/20/22 167 lb 6.4 oz (75.9 kg)  12/16/22 174 lb (78.9 kg)  11/07/22 156 lb 9.6 oz (71 kg)    Patient has diuresed approximately another 10 pounds.  She reports feeling tired and more sleepy.  She denies any shortness of breath or chest pain or abdominal pain.  Last time her bilirubin was slightly elevated 1.3 however her INR was normal and her PTT was markedly elevated.  Therefore MELD score is low..  Today on exam there is no ascites.  There is no pitting edema.  Her blood pressure stable.  She does not appear dehydrated.  She is not tachycardic.  At that time, my plan was:  Patient appears euvolemic today.  Decrease her Lasix to 20 mg a day  and decrease spironolactone to 12.5 mg a day.  Check potassium level.  Patient currently taking 20 mEq of potassium daily.  Titrate as necessary.  Check an ammonia level given her increased feeling of drowsiness and tiredness.  If elevated, I will institute lactulose.  If normal, I have recommended stopping trazodone and avoiding centrally acting substances  12/16/22 The last saw the patient, she is managing her medicines.  She is uncertain of what she is taking.  She thinks she is taking Lasix 40 mg a day and spironolactone 25 mg a day but she is uncertain.  However her weight has exploded.  Her weight is up to 174 pounds today Wt Readings from Last 3 Encounters:  12/20/22 167 lb 6.4 oz (75.9 kg)  12/16/22 174 lb (78.9 kg)  11/07/22 156 lb 9.6 oz (71 kg)   Her abdomen is distended.  She has pitting edema in her extremities.  She denies any chest pain or shortness of breath.  However she does report that her abdomen is uncomfortable due to the distention.  Her blood pressure significantly elevated.  At that time, my plan was: Patient appears fluid overloaded.  Unfortunately I am not certain of the dose of the fluid pill that she is taking.  If she  is taking Lasix 40 mg daily I will to increase that to 40 mg twice daily and increase spironolactone to 25 mg twice daily.  However I passed the pill call and verify exactly what she is taking so that I can uptitrate the dose appropriately.  Also to recheck her blood pressure and her weight on Friday to ensure that she is diuresing appropriately and her blood pressure is coming down  12/20/22 Patient has been taking Lasix 80 mg daily and spironolactone 50 mg daily.  She denies any chest pain or shortness of breath however her blood pressure has improved dramatically and is down to 124 systolic.  She is lost 70 pounds in 4 days.  The abdominal distention has improved although she still has pitting edema in her extremities.  I believe her dry weight is 160 pounds  per my scale Past Medical History:  Diagnosis Date   Anxiety    Chronic upper GI bleeding    intestinal AVM's, portal gastropathy, esophageal varices on egd 04/2018   Coronary artery disease involving native coronary artery without angina pectoris    Dementia (HCC) 09/16/2016   Depression    DIVERTICULITIS OF COLON 06/14/2008   Qualifier: Diagnosis of  By: Nehemiah Massed RN, Elnita Maxwell     DM (diabetes mellitus), type 2 (HCC) 09/15/2016   Esophageal varices determined by endoscopy (HCC)    Hyperlipidemia    Hypertension    Hypothyroidism    Incidental pulmonary nodule, greater than or equal to 8mm 10/14/2018   LLL nodule needs f/u CT or PET-CT scan   Irritable bowel syndrome 04/06/2008   Centricity Description: IBS Qualifier: Diagnosis of  By: Melvyn Neth CMA (Duncan Dull), [name]   Centricity Description: IRRITABLE BOWEL SYNDROME Qualifier: Diagnosis of  By: Christella Hartigan MD, Melton Alar    Liver cirrhosis secondary to NASH (HCC) 09/16/2016   NASH (nonalcoholic steatohepatitis)    Obesity    Portal hypertensive gastropathy (HCC)    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 04/20/19: s/p TAVR wtih a 23mm Medtronic CoreValve Evolut Pro via the TF approach   Severe aortic stenosis    Splenomegaly 09/16/2016   Thrombocytopenia (HCC)    Thyroid disease    Tobacco abuse    Vitamin D deficiency    Past Surgical History:  Procedure Laterality Date   BIOPSY  04/04/2022   Procedure: BIOPSY;  Surgeon: Lemar Lofty., MD;  Location: [facility];  Service: Gastroenterology;;   CARDIAC CATHETERIZATION     COLONOSCOPY WITH PROPOFOL N/A 04/09/2018   Procedure: COLONOSCOPY WITH PROPOFOL;  Surgeon: Rachael Fee, MD;  Location: [facility];  Service: Endoscopy;  Laterality: N/A;   COLONOSCOPY WITH PROPOFOL N/A 04/04/2022   Procedure: COLONOSCOPY WITH PROPOFOL;  Surgeon: Meridee Score Netty Starring., MD;  Location: [facility];  Service: Gastroenterology;  Laterality: N/A;   CORONARY ANGIOPLASTY     CORONARY  STENT INTERVENTION N/A 02/11/2019   Procedure: CORONARY STENT INTERVENTION;  Surgeon: Kathleene Hazel, MD;  Location: [facility];  Service: Cardiovascular;  Laterality: N/A;  Prox RCA Prox LAD   ESOPHAGOGASTRODUODENOSCOPY (EGD) WITH PROPOFOL N/A 04/09/2018   Procedure: ESOPHAGOGASTRODUODENOSCOPY (EGD) WITH PROPOFOL;  Surgeon: Rachael Fee, MD;  Location: [facility];  Service: Endoscopy;  Laterality: N/A;   ESOPHAGOGASTRODUODENOSCOPY (EGD) WITH PROPOFOL N/A 04/04/2022   Procedure: ESOPHAGOGASTRODUODENOSCOPY (EGD) WITH PROPOFOL;  Surgeon: Meridee Score Netty Starring., MD;  Location: [facility];  Service: Gastroenterology;  Laterality: N/A;   oopherectomy     OVARIAN CYST REMOVAL  POLYPECTOMY  04/09/2018   Procedure: POLYPECTOMY;  Surgeon: Rachael Fee, MD;  Location: [facility];  Service: Endoscopy;;   POLYPECTOMY  04/04/2022   Procedure: POLYPECTOMY;  Surgeon: Lemar Lofty., MD;  Location: [facility];  Service: Gastroenterology;;   RIGHT/LEFT HEART CATH AND CORONARY ANGIOGRAPHY N/A 10/07/2018   Procedure: RIGHT/LEFT HEART CATH AND CORONARY ANGIOGRAPHY;  Surgeon: Kathleene Hazel, MD;  Location: [facility];  Service: Cardiovascular;  Laterality: N/A;   TEE WITHOUT CARDIOVERSION N/A 04/20/2019   Procedure: TRANSESOPHAGEAL ECHOCARDIOGRAM (TEE);  Surgeon: Kathleene Hazel, MD;  Location: [facility] OR;  Service: Open Heart Surgery;  Laterality: N/A;   TRANSCATHETER AORTIC VALVE REPLACEMENT, TRANSFEMORAL N/A 04/20/2019   Procedure: TRANSCATHETER AORTIC VALVE REPLACEMENT, TRANSFEMORAL approach using 23mm Evolut PRO+ medtronic valve;  Surgeon: Kathleene Hazel, MD;  Location: [facility];  Service: Open Heart Surgery;  Laterality: N/A;   Current Outpatient Medications on File Prior to Visit  Medication Sig Dispense Refill   ACCU-CHEK AVIVA PLUS test strip      ACCU-CHEK SOFTCLIX LANCETS lancets      atorvastatin (LIPITOR) 20 MG tablet TAKE 1 TABLET EVERY DAY  90 tablet 3   Blood Glucose Monitoring Suppl (ACCU-CHEK AVIVA PLUS) w/Device KIT      furosemide (LASIX) 40 MG tablet Take 1 tablet (40 mg total) by mouth daily. 30 tablet 3   losartan (COZAAR) 100 MG tablet TAKE 1 TABLET EVERY DAY 90 tablet 1   nadolol (CORGARD) 20 MG tablet Take 0.5 tablets (10 mg total) by mouth daily. 30 tablet 12   oxyCODONE (ROXICODONE) 5 MG immediate release tablet Take 1 tablet (5 mg total) by mouth every 6 (six) hours as needed for severe pain (rib pain). 20 tablet 0   potassium chloride SA (KLOR-CON M) 20 MEQ tablet Take 1 tablet (20 mEq total) by mouth daily. 30 tablet 3   traZODone (DESYREL) 50 MG tablet TAKE 1 TABLET BY MOUTH EVERY DAY AT BEDTIME AS NEEDED 90 tablet 2   FEROSUL 325 (65 Fe) MG tablet TAKE 1 TABLET EVERY DAY (Patient not taking: Reported on 12/16/2022) 90 tablet 1   FLUoxetine (PROZAC) 40 MG capsule Take 1 capsule (40 mg total) by mouth daily. (Patient not taking: Reported on 12/16/2022) 90 capsule 3   levothyroxine (SYNTHROID) 88 MCG tablet TAKE 1 TABLET EVERY DAY (Patient not taking: Reported on 12/16/2022) 90 tablet 10   pantoprazole (PROTONIX) 40 MG tablet TAKE 1 TABLET EVERY DAY (Patient not taking: Reported on 12/16/2022) 90 tablet 3   spironolactone (ALDACTONE) 25 MG tablet Take 1 tablet (25 mg total) by mouth daily. (Patient not taking: Reported on 12/16/2022) 90 tablet 3   Current Facility-Administered Medications on File Prior to Visit  Medication Dose Route Frequency Provider Last Rate Last Admin   cyanocobalamin ((VITAMIN B-12)) injection 1,000 mcg  1,000 mcg Subcutaneous Q30 days Donita Brooks, MD   1,000 mcg at 09/08/17 1408    Allergies  Allergen Reactions   Prednisone Itching    Heart rate increased   Celecoxib Rash   Macrodantin [Nitrofurantoin Macrocrystal] Rash   Social History   Socioeconomic History   Marital status: Married    Spouse name: Not on file   Number of children: 1   Years of education: Not on file   Highest  education level: Not on file  Occupational History   Not on file  Tobacco Use   Smoking status: Every Day    Packs/day: .5    Types: Cigarettes  Smokeless tobacco: Never  Vaping Use   Vaping Use: Never used  Substance and Sexual Activity   Alcohol use: No   Drug use: Not Currently    Types: Benzodiazepines   Sexual activity: Not Currently  Other Topics Concern   Not on file  Social History Narrative   Married x 53 years in 2023.   1 son   2 grandsons   Social Determinants of Health   Financial Resource Strain: Low Risk  (07/18/2022)   Overall Financial Resource Strain (CARDIA)    Difficulty of Paying Living Expenses: Not hard at all  Food Insecurity: No Food Insecurity (07/18/2022)   Hunger Vital Sign    Worried About Running Out of Food in the Last Year: Never true    Ran Out of Food in the Last Year: Never true  Transportation Needs: No Transportation Needs (07/18/2022)   PRAPARE - Administrator, Civil Service (Medical): No    Lack of Transportation (Non-Medical): No  Physical Activity: Inactive (07/18/2022)   Exercise Vital Sign    Days of Exercise per Week: 0 days    Minutes of Exercise per Session: 0 min  Stress: Stress Concern Present (07/18/2022)   [employer] of Occupational Health - Occupational Stress Questionnaire    Feeling of Stress : To some extent  Social Connections: Moderately Integrated (07/18/2022)   Social Connection and Isolation Panel [NHANES]    Frequency of Communication with Friends and Family: Three times a week    Frequency of Social Gatherings with Friends and Family: Once a week    Attends Religious Services: 1 to 4 times per year    Active Member of [employer] or Organizations: No    Attends Banker Meetings: Never    Marital Status: Married  Catering manager Violence: Not At Risk (07/18/2022)   Humiliation, Afraid, Rape, and Kick questionnaire    Fear of Current or Ex-Partner: No    Emotionally Abused: No     Physically Abused: No    Sexually Abused: No       Review of Systems  Gastrointestinal:  Positive for diarrhea.       Objective:   Physical Exam Constitutional:      Appearance: Normal appearance. She is obese.  Cardiovascular:     Rate and Rhythm: Normal rate and regular rhythm.     Heart sounds: Murmur heard.  Pulmonary:     Effort: Pulmonary effort is normal. No respiratory distress.     Breath sounds: Normal breath sounds. No wheezing, rhonchi or rales.  Abdominal:     General: Bowel sounds are normal. There is distension.     Palpations: Abdomen is soft. There is no mass.     Tenderness: There is no guarding or rebound.  Musculoskeletal:     Right lower leg: Edema present.     Left lower leg: Edema present.  Neurological:     Mental Status: She is alert.          Assessment & Plan:  Liver cirrhosis secondary to NASH (HCC) - Plan: BASIC METABOLIC PANEL WITH GFR Patient has improved with diuresis.  I will decrease Lasix to 40 mg a day and continue spironolactone 50 mg a day and recheck next week.  I emphasized to the patient and her son that she needs to weigh herself daily.  She needs to notify us immediately if she gains more than 3 pounds in a 24-hour period or if her weight is trending up.  After the last visit, the patient was unable to recognize a 20 pound weight gain over several weeks.  I believe that her dementia is making it difficult for her to recognize the trends of her weight.  Therefore I recommended that the son for the husband monitor this with her hopefully to catch significant fluid overload before it occurs

## 2022-12-21 LAB — BASIC METABOLIC PANEL WITH GFR
BUN: 7 mg/dL (ref 7–25)
CO2: 26 mmol/L (ref 20–32)
Calcium: 8.2 mg/dL — ABNORMAL LOW (ref 8.6–10.4)
Chloride: 107 mmol/L (ref 98–110)
Creat: 0.79 mg/dL (ref 0.60–1.00)
Glucose, Bld: 106 mg/dL — ABNORMAL HIGH (ref 65–99)
Potassium: 3.7 mmol/L (ref 3.5–5.3)
Sodium: 140 mmol/L (ref 135–146)
eGFR: 78 mL/min/{1.73_m2} (ref >=60)

## 2022-12-23 ENCOUNTER — Other Ambulatory Visit: Payer: Self-pay | Admitting: Family Medicine

## 2022-12-23 MED ORDER — SPIRONOLACTONE 25 MG PO TABS: 50.0000 mg | ORAL_TABLET | Freq: Every day | ORAL | 3 refills | Status: DC

## 2022-12-23 NOTE — Telephone Encounter (Signed)
5/17 LVM for pt to call back re pcp's," Nadolol to prevent esophageal variceal bleeds."

## 2022-12-24 ENCOUNTER — Telehealth: Payer: Self-pay

## 2022-12-24 ENCOUNTER — Other Ambulatory Visit: Payer: Self-pay | Admitting: Family Medicine

## 2022-12-24 MED ORDER — TRAMADOL HCL 50 MG PO TABS: 50.0000 mg | ORAL_TABLET | Freq: Every evening | ORAL | 0 refills | Status: AC | PRN

## 2022-12-24 NOTE — Telephone Encounter (Signed)
Pt states she is having issues with being able to sleep at night due to her abdominal and leg pain. Pt asks if there is any way she can have a medication to help with the pain so she can sleep? Thanks.

## 2023-01-01 ENCOUNTER — Telehealth: Payer: Self-pay

## 2023-01-01 NOTE — Telephone Encounter (Signed)
Pt called in wanting nurse to give her a cb. Pt stated that pcp wanted to be made aware of any more weight loss pt Hetz have had. Pt states that she is still losing weight and wanted to know what she should do please. Please advise  Cb#: [phone]

## 2023-01-08 ENCOUNTER — Other Ambulatory Visit: Payer: Self-pay | Admitting: Family Medicine

## 2023-01-08 NOTE — Telephone Encounter (Signed)
Requested medication (s) are due for refill today - yes  Requested medication (s) are on the active medication list -yes  Future visit scheduled -yes  Last refill: 01/21/22 #90 3RF  Notes to clinic: Last OV 12/20/22- patient reported not taking 12/16/22- request sent for review   Requested Prescriptions  Pending Prescriptions Disp Refills   FLUoxetine (PROZAC) 40 MG capsule [Pharmacy Med Name: FLUOXETINE HCL 40 MG CAPSULE] 90 capsule 3    Sig: Take 1 capsule (40 mg total) by mouth daily.     Psychiatry:  Antidepressants - SSRI Failed - 01/08/2023  2:33 AM      Failed - Valid encounter within last 6 months    Recent Outpatient Visits           1 year ago Hematuria, unspecified type   [facility] Donita Brooks, MD   1 year ago Type 2 diabetes mellitus with other specified complication, without long-term current use of insulin (HCC)   [facility] Pickard, Priscille Heidelberg, MD   1 year ago Situational anxiety   [facility] Pickard, Priscille Heidelberg, MD   1 year ago Lower abdominal pain   [facility] Donita Brooks, MD   2 years ago Type 2 diabetes mellitus with other specified complication, without long-term current use of insulin (HCC)   [facility] Donita Brooks, MD       Future Appointments             In 1 week Pickard, Priscille Heidelberg, MD [facility]            Passed - Completed PHQ-2 or PHQ-9 in the last 360 days         Requested Prescriptions  Pending Prescriptions Disp Refills   FLUoxetine (PROZAC) 40 MG capsule [Pharmacy Med Name: FLUOXETINE HCL 40 MG CAPSULE] 90 capsule 3    Sig: Take 1 capsule (40 mg total) by mouth daily.     Psychiatry:  Antidepressants - SSRI Failed - 01/08/2023  2:33 AM      Failed - Valid encounter within last 6 months    Recent Outpatient Visits           1 year ago Hematuria, unspecified type   [facility]  Donita Brooks, MD   1 year ago Type 2 diabetes mellitus with other specified complication, without long-term current use of insulin (HCC)   [facility] Pickard, Priscille Heidelberg, MD   1 year ago Situational anxiety   [facility] Pickard, Priscille Heidelberg, MD   1 year ago Lower abdominal pain   [facility] Donita Brooks, MD   2 years ago Type 2 diabetes mellitus with other specified complication, without long-term current use of insulin (HCC)   [facility] Pickard, Priscille Heidelberg, MD       Future Appointments             In 1 week Pickard, Priscille Heidelberg, MD [facility]            Passed - Completed PHQ-2 or PHQ-9 in the last 360 days

## 2023-01-16 ENCOUNTER — Encounter: Payer: Self-pay | Admitting: Family Medicine

## 2023-01-16 ENCOUNTER — Ambulatory Visit (INDEPENDENT_AMBULATORY_CARE_PROVIDER_SITE_OTHER): Payer: Medicare HMO | Admitting: Family Medicine

## 2023-01-16 VITALS — BP 124/68 | HR 51 | Temp 97.8°F | Ht 65.0 in | Wt 145.6 lb

## 2023-01-16 DIAGNOSIS — K7581 Nonalcoholic steatohepatitis (NASH): Secondary | ICD-10-CM | POA: Diagnosis not present

## 2023-01-16 DIAGNOSIS — K7460 Unspecified cirrhosis of liver: Secondary | ICD-10-CM

## 2023-01-16 LAB — CBC WITH DIFFERENTIAL/PLATELET
Absolute Monocytes: 326 cells/uL (ref 200–950)
Basophils Absolute: 19 cells/uL (ref 0–200)
Basophils Relative: 0.6 %
Eosinophils Absolute: 70 cells/uL (ref 15–500)
Eosinophils Relative: 2.2 %
HCT: 26.9 % — ABNORMAL LOW (ref 35.0–45.0)
Hemoglobin: 9 g/dL — ABNORMAL LOW (ref 11.7–15.5)
Lymphs Abs: 710 cells/uL — ABNORMAL LOW (ref 850–3900)
MCH: 28.1 pg (ref 27.0–33.0)
MCHC: 33.5 g/dL (ref 32.0–36.0)
MCV: 84.1 fL (ref 80.0–100.0)
MPV: 11.3 fL (ref 7.5–12.5)
Monocytes Relative: 10.2 %
Neutro Abs: 2074 cells/uL (ref 1500–7800)
Neutrophils Relative %: 64.8 %
Platelets: 58 10*3/uL — ABNORMAL LOW (ref 140–400)
RBC: 3.2 10*6/uL — ABNORMAL LOW (ref 3.80–5.10)
RDW: 14.3 % (ref 11.0–15.0)
Total Lymphocyte: 22.2 %
WBC: 3.2 10*3/uL — ABNORMAL LOW (ref 3.8–10.8)

## 2023-01-16 LAB — COMPLETE METABOLIC PANEL WITH GFR
AG Ratio: 1.1 (calc) (ref 1.0–2.5)
ALT: 9 U/L (ref 6–29)
AST: 19 U/L (ref 10–35)
Albumin: 3.1 g/dL — ABNORMAL LOW (ref 3.6–5.1)
Alkaline phosphatase (APISO): 116 U/L (ref 37–153)
BUN: 10 mg/dL (ref 7–25)
CO2: 25 mmol/L (ref 20–32)
Calcium: 8.7 mg/dL (ref 8.6–10.4)
Chloride: 108 mmol/L (ref 98–110)
Creat: 0.81 mg/dL (ref 0.60–1.00)
Globulin: 2.9 g/dL (calc) (ref 1.9–3.7)
Glucose, Bld: 104 mg/dL — ABNORMAL HIGH (ref 65–99)
Potassium: 3.7 mmol/L (ref 3.5–5.3)
Sodium: 138 mmol/L (ref 135–146)
Total Bilirubin: 0.9 mg/dL (ref 0.2–1.2)
Total Protein: 6 g/dL — ABNORMAL LOW (ref 6.1–8.1)
eGFR: 76 mL/min/{1.73_m2} (ref >=60)

## 2023-01-16 NOTE — Progress Notes (Signed)
Subjective:    Patient ID: Kelly [name], female    DOB: 1951-07-31, 72 y.o.   MRN: [id number]   10/31/22  I saw the patient March 12.  She weighed 157 pounds.  My partner saw her on March 25 and her weight had increased to 178 pounds.  She was having ascites in her abdomen as well as pitting edema in her extremities.  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  She typically takes Lasix 20 mg a day along with potassium.  My partner put her on Lasix 40 mg twice daily and she is here today for follow-up.  This was 3 days ago.  The patient has diuresed 13 pounds and her weight is down to 165.  I believe her dry weight is closer to 157.  She denies any cramps.  She denies any dizziness.  She denies any lightheadedness.  At that time, my plan was: Patient has cirrhosis and I believe was experiencing ascites secondary to cirrhosis.  Decrease Lasix to 40 mg a day.  Have asked the patient to stop her potassium.  In place at this time we will start her on spironolactone 25 mg daily and I will recheck the patient's weight and renal function and potassium in 1 week.  Hopefully we can find a dose of diuretics that will help maintain her fluid balance without causing renal insufficiency  11/07/22 Wt Readings from Last 3 Encounters:  12/20/22 167 lb 6.4 oz (75.9 kg)  12/16/22 174 lb (78.9 kg)  11/07/22 156 lb 9.6 oz (71 kg)    Patient has diuresed approximately another 10 pounds.  She reports feeling tired and more sleepy.  She denies any shortness of breath or chest pain or abdominal pain.  Last time her bilirubin was slightly elevated 1.3 however her INR was normal and her PTT was markedly elevated.  Therefore MELD score is low..  Today on exam there is no ascites.  There is no pitting edema.  Her blood pressure stable.  She does not appear dehydrated.  She is not tachycardic.  At that time, my plan was:  Patient appears euvolemic today.  Decrease her Lasix to 20 mg a day  and decrease spironolactone to 12.5 mg a day.  Check potassium level.  Patient currently taking 20 mEq of potassium daily.  Titrate as necessary.  Check an ammonia level given her increased feeling of drowsiness and tiredness.  If elevated, I will institute lactulose.  If normal, I have recommended stopping trazodone and avoiding centrally acting substances  12/16/22 The last saw the patient, she is managing her medicines.  She is uncertain of what she is taking.  She thinks she is taking Lasix 40 mg a day and spironolactone 25 mg a day but she is uncertain.  However her weight has exploded.  Her weight is up to 174 pounds today Wt Readings from Last 3 Encounters:  12/20/22 167 lb 6.4 oz (75.9 kg)  12/16/22 174 lb (78.9 kg)  11/07/22 156 lb 9.6 oz (71 kg)   Her abdomen is distended.  She has pitting edema in her extremities.  She denies any chest pain or shortness of breath.  However she does report that her abdomen is uncomfortable due to the distention.  Her blood pressure significantly elevated.  At that time, my plan was: Patient appears fluid overloaded.  Unfortunately I am not certain of the dose of the fluid pill that she is taking.  If she  is taking Lasix 40 mg daily I will to increase that to 40 mg twice daily and increase spironolactone to 25 mg twice daily.  However I passed the pill call and verify exactly what she is taking so that I can uptitrate the dose appropriately.  Also to recheck her blood pressure and her weight on Friday to ensure that she is diuresing appropriately and her blood pressure is coming down  12/20/22 Patient has been taking Lasix 80 mg daily and spironolactone 50 mg daily.  She denies any chest pain or shortness of breath however her blood pressure has improved dramatically and is down to 124 systolic.  She is lost 70 pounds in 4 days.  The abdominal distention has improved although she still has pitting edema in her extremities.  I believe her dry weight is 160 pounds  per my scale.  At that time, my plan was: Patient has improved with diuresis.  I will decrease Lasix to 40 mg a day and continue spironolactone 50 mg a day and recheck next week.  I emphasized to the patient and her son that she needs to weigh herself daily.  She needs to notify us immediately if she gains more than 3 pounds in a 24-hour period or if her weight is trending up.  After the last visit, the patient was unable to recognize a 20 pound weight gain over several weeks.  I believe that her dementia is making it difficult for her to recognize the trends of her weight.  Therefore I recommended that the son for the husband monitor this with her hopefully to catch significant fluid overload before it occurs  01/16/23 Wt Readings from Last 3 Encounters:  01/16/23 145 lb 9.6 oz (66 kg)  12/20/22 167 lb 6.4 oz (75.9 kg)  12/16/22 174 lb (78.9 kg)   Patient has lost 18 pounds since her last office visit.  She called me on [place] 29 reporting weight loss and at that time we scaled back her Lasix to 20 mg a day and spironolactone 25 mg a day.  She insist that that is the dose she is taking now.  She says that her weight has remained stable in the mid 140 range for the last week or so.  She denies any abdominal pain, nausea or vomiting.  No intervention denies any shortness of breath.  There is no asterixis on exam today.  She does have some baseline dementia however she seems slightly more confused than normal today Past Medical History:  Diagnosis Date   Anxiety    Chronic upper GI bleeding    intestinal AVM's, portal gastropathy, esophageal varices on egd 04/2018   Coronary artery disease involving native coronary artery without angina pectoris    Dementia (HCC) 09/16/2016   Depression    DIVERTICULITIS OF COLON 06/14/2008   Qualifier: Diagnosis of  By: Nehemiah Massed RN, Elnita Maxwell     DM (diabetes mellitus), type 2 (HCC) 09/15/2016   Esophageal varices determined by endoscopy (HCC)    Hyperlipidemia     Hypertension    Hypothyroidism    Incidental pulmonary nodule, greater than or equal to 8mm 10/14/2018   LLL nodule needs f/u CT or PET-CT scan   Irritable bowel syndrome 04/06/2008   Centricity Description: IBS Qualifier: Diagnosis of  By: Melvyn Neth CMA Duncan Dull), [name]   Centricity Description: IRRITABLE BOWEL SYNDROME Qualifier: Diagnosis of  By: Christella Hartigan MD, Melton Alar    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 04/20/19: s/p TAVR wtih a 23mm Medtronic CoreValve Evolut Pro via the TF approach   Severe aortic stenosis    Splenomegaly 09/16/2016   Thrombocytopenia (HCC)    Thyroid disease    Tobacco abuse    Vitamin D deficiency    Past Surgical History:  Procedure Laterality Date   BIOPSY  04/04/2022   Procedure: BIOPSY;  Surgeon: Lemar Lofty., MD;  Location: [facility];  Service: Gastroenterology;;   CARDIAC CATHETERIZATION     COLONOSCOPY WITH PROPOFOL N/A 04/09/2018   Procedure: COLONOSCOPY WITH PROPOFOL;  Surgeon: Rachael Fee, MD;  Location: [facility];  Service: Endoscopy;  Laterality: N/A;   COLONOSCOPY WITH PROPOFOL N/A 04/04/2022   Procedure: COLONOSCOPY WITH PROPOFOL;  Surgeon: Meridee Score Netty Starring., MD;  Location: [facility];  Service: Gastroenterology;  Laterality: N/A;   CORONARY ANGIOPLASTY     CORONARY STENT INTERVENTION N/A 02/11/2019   Procedure: CORONARY STENT INTERVENTION;  Surgeon: Kathleene Hazel, MD;  Location: [facility];  Service: Cardiovascular;  Laterality: N/A;  Prox RCA Prox LAD   ESOPHAGOGASTRODUODENOSCOPY (EGD) WITH PROPOFOL N/A 04/09/2018   Procedure: ESOPHAGOGASTRODUODENOSCOPY (EGD) WITH PROPOFOL;  Surgeon: Rachael Fee, MD;  Location: [facility];  Service: Endoscopy;  Laterality: N/A;   ESOPHAGOGASTRODUODENOSCOPY (EGD) WITH PROPOFOL N/A 04/04/2022   Procedure:  ESOPHAGOGASTRODUODENOSCOPY (EGD) WITH PROPOFOL;  Surgeon: Meridee Score Netty Starring., MD;  Location: [facility];  Service: Gastroenterology;  Laterality: N/A;   oopherectomy     OVARIAN CYST REMOVAL     POLYPECTOMY  04/09/2018   Procedure: POLYPECTOMY;  Surgeon: Rachael Fee, MD;  Location: [facility];  Service: Endoscopy;;   POLYPECTOMY  04/04/2022   Procedure: POLYPECTOMY;  Surgeon: Lemar Lofty., MD;  Location: [facility];  Service: Gastroenterology;;   RIGHT/LEFT HEART CATH AND CORONARY ANGIOGRAPHY N/A 10/07/2018   Procedure: RIGHT/LEFT HEART CATH AND CORONARY ANGIOGRAPHY;  Surgeon: Kathleene Hazel, MD;  Location: [facility];  Service: Cardiovascular;  Laterality: N/A;   TEE WITHOUT CARDIOVERSION N/A 04/20/2019   Procedure: TRANSESOPHAGEAL ECHOCARDIOGRAM (TEE);  Surgeon: Kathleene Hazel, MD;  Location: [facility] OR;  Service: Open Heart Surgery;  Laterality: N/A;   TRANSCATHETER AORTIC VALVE REPLACEMENT, TRANSFEMORAL N/A 04/20/2019   Procedure: TRANSCATHETER AORTIC VALVE REPLACEMENT, TRANSFEMORAL approach using 23mm Evolut PRO+ medtronic valve;  Surgeon: Kathleene Hazel, MD;  Location: [facility];  Service: Open Heart Surgery;  Laterality: N/A;   Current Outpatient Medications on File Prior to Visit  Medication Sig Dispense Refill   ACCU-CHEK AVIVA PLUS test strip      ACCU-CHEK SOFTCLIX LANCETS lancets      atorvastatin (LIPITOR) 20 MG tablet TAKE 1 TABLET EVERY DAY 90 tablet 3   Blood Glucose Monitoring Suppl (ACCU-CHEK AVIVA PLUS) w/Device KIT      FEROSUL 325 (65 Fe) MG tablet TAKE 1 TABLET EVERY DAY (Patient not taking: Reported on 12/16/2022) 90 tablet 1   FLUoxetine (PROZAC) 40 MG capsule TAKE 1 CAPSULE (40 MG TOTAL) BY MOUTH DAILY. 90 capsule 1   furosemide (LASIX) 40 MG tablet Take 1 tablet (40 mg total) by mouth daily. 30 tablet 3   levothyroxine (SYNTHROID) 88 MCG tablet TAKE 1 TABLET EVERY DAY (Patient not taking: Reported on 12/16/2022) 90 tablet 10    losartan (COZAAR) 100 MG tablet TAKE 1 TABLET EVERY DAY 90 tablet 1   nadolol (CORGARD) 20 MG tablet Take  0.5 tablets (10 mg total) by mouth daily. 30 tablet 12   pantoprazole (PROTONIX) 40 MG tablet TAKE 1 TABLET EVERY DAY (Patient not taking: Reported on 12/16/2022) 90 tablet 3   potassium chloride SA (KLOR-CON M) 20 MEQ tablet Take 1 tablet (20 mEq total) by mouth daily. 30 tablet 3   spironolactone (ALDACTONE) 25 MG tablet Take 2 tablets (50 mg total) by mouth daily. 180 tablet 3   traMADol (ULTRAM) 50 MG tablet Take 1 tablet (50 mg total) by mouth at bedtime as needed (leg pain). 15 tablet 0   traZODone (DESYREL) 50 MG tablet TAKE 1 TABLET BY MOUTH EVERY DAY AT BEDTIME AS NEEDED 90 tablet 2   Current Facility-Administered Medications on File Prior to Visit  Medication Dose Route Frequency Provider Last Rate Last Admin   cyanocobalamin ((VITAMIN B-12)) injection 1,000 mcg  1,000 mcg Subcutaneous Q30 days Donita Brooks, MD   1,000 mcg at 09/08/17 1408    Allergies  Allergen Reactions   Prednisone Itching    Heart rate increased   Celecoxib Rash   Macrodantin [Nitrofurantoin Macrocrystal] Rash   Social History   Socioeconomic History   Marital status: Married    Spouse name: Not on file   Number of children: 1   Years of education: Not on file   Highest education level: Not on file  Occupational History   Not on file  Tobacco Use   Smoking status: Every Day    Packs/day: .5    Types: Cigarettes   Smokeless tobacco: Never  Vaping Use   Vaping Use: Never used  Substance and Sexual Activity   Alcohol use: No   Drug use: Not Currently    Types: Benzodiazepines   Sexual activity: Not Currently  Other Topics Concern   Not on file  Social History Narrative   Married x 53 years in 2023.   1 son   2 grandsons   Social Determinants of Health   Financial Resource Strain: Low Risk  (07/18/2022)   Overall Financial Resource Strain (CARDIA)    Difficulty of Paying  Living Expenses: Not hard at all  Food Insecurity: No Food Insecurity (07/18/2022)   Hunger Vital Sign    Worried About Running Out of Food in the Last Year: Never true    Ran Out of Food in the Last Year: Never true  Transportation Needs: No Transportation Needs (07/18/2022)   PRAPARE - Administrator, Civil Service (Medical): No    Lack of Transportation (Non-Medical): No  Physical Activity: Inactive (07/18/2022)   Exercise Vital Sign    Days of Exercise per Week: 0 days    Minutes of Exercise per Session: 0 min  Stress: Stress Concern Present (07/18/2022)   [employer] of Occupational Health - Occupational Stress Questionnaire    Feeling of Stress : To some extent  Social Connections: Moderately Integrated (07/18/2022)   Social Connection and Isolation Panel [NHANES]    Frequency of Communication with Friends and Family: Three times a week    Frequency of Social Gatherings with Friends and Family: Once a week    Attends Religious Services: 1 to 4 times per year    Active Member of [employer] or Organizations: No    Attends Banker Meetings: Never    Marital Status: Married  Catering manager Violence: Not At Risk (07/18/2022)   Humiliation, Afraid, Rape, and Kick questionnaire    Fear of Current or Ex-Partner: No    Emotionally Abused: No  Physically Abused: No    Sexually Abused: No       Review of Systems  Gastrointestinal:  Positive for diarrhea.       Objective:   Physical Exam Constitutional:      Appearance: Normal appearance. She is obese.  Cardiovascular:     Rate and Rhythm: Normal rate and regular rhythm.     Heart sounds: Murmur heard.  Pulmonary:     Effort: Pulmonary effort is normal. No respiratory distress.     Breath sounds: Normal breath sounds. No wheezing, rhonchi or rales.  Abdominal:     General: Bowel sounds are normal. There is no distension.     Palpations: Abdomen is soft. There is no mass.     Tenderness:  There is no guarding or rebound.  Musculoskeletal:     Right lower leg: No edema.     Left lower leg: No edema.  Neurological:     Mental Status: She is alert.          Assessment & Plan:  Liver cirrhosis secondary to NASH (HCC) - Plan: CBC with Differential/Platelet, COMPLETE METABOLIC PANEL WITH GFR, Ammonia Patient appears euvolemic today.  Continue Lasix 20 mg a day and spironolactone 25 mg a day.  Check renal function and potassium.  Given mild confusion check ammonia level.  Suspect that most of the confusion is due to her underlying dementia.

## 2023-01-17 LAB — AMMONIA: Ammonia: 30 umol/L (ref <=72)

## 2023-01-20 ENCOUNTER — Encounter: Payer: Self-pay | Admitting: Oncology

## 2023-01-21 ENCOUNTER — Encounter: Payer: Self-pay | Admitting: Podiatry

## 2023-01-21 ENCOUNTER — Ambulatory Visit: Payer: Medicare HMO | Admitting: Podiatry

## 2023-01-21 ENCOUNTER — Other Ambulatory Visit: Payer: Self-pay

## 2023-01-21 DIAGNOSIS — B351 Tinea unguium: Secondary | ICD-10-CM | POA: Diagnosis not present

## 2023-01-21 DIAGNOSIS — E119 Type 2 diabetes mellitus without complications: Secondary | ICD-10-CM

## 2023-01-21 DIAGNOSIS — M79674 Pain in right toe(s): Secondary | ICD-10-CM

## 2023-01-21 DIAGNOSIS — M79675 Pain in left toe(s): Secondary | ICD-10-CM

## 2023-01-21 DIAGNOSIS — D509 Iron deficiency anemia, unspecified: Secondary | ICD-10-CM

## 2023-01-21 NOTE — Progress Notes (Signed)
This patient returns to my office for at risk foot care.  This patient requires this care by a professional since this patient will be at risk due to having thrombocytopenia and diabetes.  This patient is unable to cut nails herself since the patient cannot reach her nails.These nails are painful walking and wearing shoes.  This patient presents for at risk foot care today. ? ?General Appearance  Alert, conversant and in no acute stress. ? ?Vascular  Dorsalis pedis and posterior tibial  pulses are palpable  bilaterally.  Capillary return is within normal limits  bilaterally. Temperature is within normal limits  bilaterally. ? ?Neurologic  Senn-Weinstein monofilament wire test within normal limits  bilaterally. Muscle power within normal limits bilaterally. ? ?Nails Thick disfigured discolored nails with subungual debris  from hallux to fifth toes bilaterally. No evidence of bacterial infection or drainage bilaterally. ? ?Orthopedic  No limitations of motion  feet .  No crepitus or effusions noted.  No bony pathology or digital deformities noted.  HAV  B/L ? ?Skin  normotropic skin with no porokeratosis noted bilaterally.  No signs of infections or ulcers noted.    ? ?Onychomycosis  Pain in right toes  Pain in left toes ? ?Consent was obtained for treatment procedures.   Mechanical debridement of nails 1-5  bilaterally performed with a nail nipper.  Filed with dremel without incident.  ? ? ?Return office visit  3 months                    Told patient to return for periodic foot care and evaluation due to potential at risk complications. ? ? ?Ever Gustafson DPM  ?

## 2023-01-23 ENCOUNTER — Other Ambulatory Visit: Payer: Medicare HMO

## 2023-01-23 DIAGNOSIS — H6123 Impacted cerumen, bilateral: Secondary | ICD-10-CM | POA: Diagnosis not present

## 2023-01-23 DIAGNOSIS — D509 Iron deficiency anemia, unspecified: Secondary | ICD-10-CM | POA: Diagnosis not present

## 2023-01-24 LAB — B12 AND FOLATE PANEL
Folate: 12 ng/mL
Vitamin B-12: 294 pg/mL (ref 200–1100)

## 2023-01-24 LAB — IRON,TIBC AND FERRITIN PANEL
%SAT: 18 % (calc) (ref 16–45)
Ferritin: 26 ng/mL (ref 16–288)
Iron: 46 ug/dL (ref 45–160)
TIBC: 259 mcg/dL (calc) (ref 250–450)

## 2023-01-26 ENCOUNTER — Other Ambulatory Visit: Payer: Self-pay | Admitting: Family Medicine

## 2023-01-27 ENCOUNTER — Other Ambulatory Visit: Payer: Self-pay

## 2023-01-27 DIAGNOSIS — D509 Iron deficiency anemia, unspecified: Secondary | ICD-10-CM

## 2023-01-27 DIAGNOSIS — K7460 Unspecified cirrhosis of liver: Secondary | ICD-10-CM

## 2023-01-27 MED ORDER — VITAMIN B-12 1000 MCG PO TABS
1000.0000 ug | ORAL_TABLET | Freq: Every day | ORAL | 3 refills | Status: AC
Start: 2023-01-27 — End: ?

## 2023-01-27 MED ORDER — IRON (FERROUS SULFATE) 325 (65 FE) MG PO TABS
325.0000 mg | ORAL_TABLET | Freq: Every day | ORAL | 3 refills | Status: AC
Start: 2023-01-27 — End: ?

## 2023-01-27 NOTE — Telephone Encounter (Signed)
Unable to refill per protocol, Rx expired. Discontinued 01/16/23.  Requested Prescriptions  Pending Prescriptions Disp Refills   furosemide (LASIX) 40 MG tablet [Pharmacy Med Name: FUROSEMIDE 40 MG TABLET] 90 tablet 1    Sig: TAKE 1 TABLET BY MOUTH EVERY DAY     Cardiovascular:  Diuretics - Loop Failed - 01/26/2023  2:18 AM      Failed - Mg Level in normal range and within 180 days    Magnesium  Date Value Ref Range Status  04/21/2019 1.7 1.7 - 2.4 mg/dL Final    Comment:    Performed at [facility], [address] [place], [place] [address]         Failed - Valid encounter within last 6 months    Recent Outpatient Visits           1 year ago Hematuria, unspecified type   [facility], Priscille Heidelberg, MD   1 year ago Type 2 diabetes mellitus with other specified complication, without long-term current use of insulin (HCC)   [facility] Pickard, Priscille Heidelberg, MD   1 year ago Situational anxiety   [facility] Donita Brooks, MD   2 years ago Lower abdominal pain   [facility] Tanya Nones, Priscille Heidelberg, MD   2 years ago Type 2 diabetes mellitus with other specified complication, without long-term current use of insulin (HCC)   [facility] Pickard, Priscille Heidelberg, MD              Passed - K in normal range and within 180 days    Potassium  Date Value Ref Range Status  01/16/2023 3.7 3.5 - 5.3 mmol/L Final         Passed - Ca in normal range and within 180 days    Calcium  Date Value Ref Range Status  01/16/2023 8.7 8.6 - 10.4 mg/dL Final   Calcium, Ion  Date Value Ref Range Status  04/20/2019 1.26 1.15 - 1.40 mmol/L Final         Passed - Na in normal range and within 180 days    Sodium  Date Value Ref Range Status  01/16/2023 138 135 - 146 mmol/L Final  04/28/2019 141 134 - 144 mmol/L Final         Passed - Cr in normal range and within 180 days    Creat  Date Value Ref Range Status   01/16/2023 0.81 0.60 - 1.00 mg/dL Final         Passed - Cl in normal range and within 180 days    Chloride  Date Value Ref Range Status  01/16/2023 108 98 - 110 mmol/L Final         Passed - Last BP in normal range    BP Readings from Last 1 Encounters:  01/16/23 124/68

## 2023-01-31 ENCOUNTER — Other Ambulatory Visit: Payer: Self-pay | Admitting: Family Medicine

## 2023-02-03 NOTE — Telephone Encounter (Signed)
Requested by interface surescripts. Medication dose changed/ discontinued.  Requested Prescriptions  Refused Prescriptions Disp Refills   KLOR-CON M20 20 MEQ tablet [Pharmacy Med Name: KLOR-CON M20 TABLET] 90 tablet 1    Sig: TAKE 1 TABLET BY MOUTH EVERY DAY     Endocrinology:  Minerals - Potassium Supplementation Failed - 01/31/2023  2:36 AM      Failed - Valid encounter within last 12 months    Recent Outpatient Visits           1 year ago Hematuria, unspecified type   [facility] Donita Brooks, MD   1 year ago Type 2 diabetes mellitus with other specified complication, without long-term current use of insulin (HCC)   [facility] Pickard, Priscille Heidelberg, MD   1 year ago Situational anxiety   [facility] Tanya Nones, Priscille Heidelberg, MD   2 years ago Lower abdominal pain   [facility] Donita Brooks, MD   2 years ago Type 2 diabetes mellitus with other specified complication, without long-term current use of insulin (HCC)   [facility] Pickard, Priscille Heidelberg, MD              Passed - K in normal range and within 360 days    Potassium  Date Value Ref Range Status  01/16/2023 3.7 3.5 - 5.3 mmol/L Final         Passed - Cr in normal range and within 360 days    Creat  Date Value Ref Range Status  01/16/2023 0.81 0.60 - 1.00 mg/dL Final

## 2023-03-24 ENCOUNTER — Ambulatory Visit: Payer: Medicare HMO | Admitting: Family Medicine

## 2023-04-23 ENCOUNTER — Ambulatory Visit: Payer: Medicare HMO | Admitting: Podiatry

## 2023-05-21 ENCOUNTER — Other Ambulatory Visit: Payer: Self-pay | Admitting: Family Medicine

## 2023-05-21 NOTE — Telephone Encounter (Signed)
Requested medication (s) are due for refill today: yes  Requested medication (s) are on the active medication list: yes  Last refill:  05/20/22  Future visit scheduled: no  Notes to clinic:  Unable to refill per protocol due to failed labs, no updated TSH results.      Requested Prescriptions  Pending Prescriptions Disp Refills   levothyroxine (SYNTHROID) 88 MCG tablet [Pharmacy Med Name: Levothyroxine Sodium Oral Tablet 88 MCG] 90 tablet 3    Sig: TAKE 1 TABLET EVERY DAY     Endocrinology:  Hypothyroid Agents Failed - 05/21/2023  4:21 AM      Failed - TSH in normal range and within 360 days    TSH  Date Value Ref Range Status  11/27/2021 1.77 0.40 - 4.50 mIU/L Final         Failed - Valid encounter within last 12 months    Recent Outpatient Visits           1 year ago Hematuria, unspecified type   [facility] Donita Brooks, MD   1 year ago Type 2 diabetes mellitus with other specified complication, without long-term current use of insulin (HCC)   [facility] Pickard, Priscille Heidelberg, MD   2 years ago Situational anxiety   [facility] Pickard, Priscille Heidelberg, MD   2 years ago Lower abdominal pain   [facility] Donita Brooks, MD   2 years ago Type 2 diabetes mellitus with other specified complication, without long-term current use of insulin (HCC)   [facility] Pickard, Priscille Heidelberg, MD

## 2023-07-24 ENCOUNTER — Other Ambulatory Visit: Payer: Self-pay | Admitting: Family Medicine

## 2023-07-24 ENCOUNTER — Ambulatory Visit: Payer: Medicare HMO | Admitting: *Deleted

## 2023-07-24 DIAGNOSIS — Z0000 Encounter for general adult medical examination without abnormal findings: Secondary | ICD-10-CM

## 2023-07-24 NOTE — Progress Notes (Signed)
Subjective:   Kelly [name] is a 72 y.o. female who presents for Medicare Annual (Subsequent) preventive examination.  Visit Complete: Virtual I connected with  Kelly [name] on 07/24/23 by a audio enabled telemedicine application and verified that I am speaking with the correct person using two identifiers.  Patient Location: Home  Provider Location: Home Office  I discussed the limitations of evaluation and management by telemedicine. The patient expressed understanding and agreed to proceed.  Vital Signs: Because this visit was a virtual/telehealth visit, some criteria [place] be missing or patient reported. Any vitals not documented were not able to be obtained and vitals that have been documented are patient reported.    Cardiac Risk Factors include: advanced age (>60men, >69 women);family history of premature cardiovascular disease;smoking/ tobacco exposure     Objective:    There were no vitals filed for this visit. There is no height or weight on file to calculate BMI.     07/24/2023   12:13 PM 07/18/2022    1:38 PM 04/04/2022    2:47 PM 07/12/2021   11:23 AM 06/01/2020   11:12 AM 04/20/2019    7:00 PM 04/16/2019   11:56 AM  Advanced Directives  Does Patient Have a Medical Advance Directive? No No No Yes No Yes Yes  Type of Theme park manager;Living will  Healthcare Power of [place];Living will Healthcare Power of [place];Living will  Does patient want to make changes to medical advance directive?      No - Patient declined   Copy of Healthcare Power of Attorney in Chart?    No - copy requested  No - copy requested No - copy requested  Would patient like information on creating a medical advance directive? No - Patient declined Yes (MAU/Ambulatory/Procedural Areas - Information given)   No - Patient declined      Current Medications (verified) Outpatient Encounter Medications as of 07/24/2023  Medication Sig   atorvastatin (LIPITOR) 20  MG tablet TAKE 1 TABLET EVERY DAY   cyanocobalamin (VITAMIN B12) 1000 MCG tablet Take 1 tablet (1,000 mcg total) by mouth daily.   FLUoxetine (PROZAC) 40 MG capsule TAKE 1 CAPSULE (40 MG TOTAL) BY MOUTH DAILY.   furosemide (LASIX) 20 MG tablet Take 20 mg by mouth daily.   losartan (COZAAR) 100 MG tablet TAKE 1 TABLET EVERY DAY   nadolol (CORGARD) 20 MG tablet Take 0.5 tablets (10 mg total) by mouth daily.   potassium chloride (KLOR-CON M) 10 MEQ tablet Take 10 mEq by mouth 2 (two) times daily.   spironolactone (ALDACTONE) 25 MG tablet Take 2 tablets (50 mg total) by mouth daily. (Patient taking differently: Take 25 mg by mouth daily.)   ACCU-CHEK AVIVA PLUS test strip  (Patient not taking: Reported on 07/24/2023)   ACCU-CHEK SOFTCLIX LANCETS lancets  (Patient not taking: Reported on 07/24/2023)   Blood Glucose Monitoring Suppl (ACCU-CHEK AVIVA PLUS) w/Device KIT  (Patient not taking: Reported on 07/24/2023)   Iron, Ferrous Sulfate, 325 (65 Fe) MG TABS Take 325 mg by mouth daily.   levothyroxine (SYNTHROID) 88 MCG tablet TAKE 1 TABLET EVERY DAY (Patient not taking: Reported on 07/24/2023)   pantoprazole (PROTONIX) 40 MG tablet TAKE 1 TABLET EVERY DAY (Patient not taking: Reported on 12/16/2022)   traZODone (DESYREL) 50 MG tablet TAKE 1 TABLET BY MOUTH EVERY DAY AT BEDTIME AS NEEDED (Patient not taking: Reported on 07/24/2023)   No facility-administered encounter medications on file as of 07/24/2023.  Allergies (verified) Prednisone, Celecoxib, and Macrodantin [nitrofurantoin macrocrystal]   History: Past Medical History:  Diagnosis Date   Anxiety    Chronic upper GI bleeding    intestinal AVM's, portal gastropathy, esophageal varices on egd 04/2018   Coronary artery disease involving native coronary artery without angina pectoris    Dementia (HCC) 09/16/2016   Depression    DIVERTICULITIS OF COLON 06/14/2008   Qualifier: Diagnosis of  By: Nehemiah Massed RN, Elnita Maxwell     DM (diabetes mellitus),  type 2 (HCC) 09/15/2016   Esophageal varices determined by endoscopy (HCC)    Hyperlipidemia    Hypertension    Hypothyroidism    Incidental pulmonary nodule, greater than or equal to 8mm 10/14/2018   LLL nodule needs f/u CT or PET-CT scan   Irritable bowel syndrome 04/06/2008   Centricity Description: IBS Qualifier: Diagnosis of  By: Melvyn Neth CMA (Duncan Dull), [name]   Centricity Description: IRRITABLE BOWEL SYNDROME Qualifier: Diagnosis of  By: Christella Hartigan MD, Melton Alar    Liver cirrhosis secondary to NASH (HCC) 09/16/2016   NASH (nonalcoholic steatohepatitis)    Obesity    Portal hypertensive gastropathy (HCC)    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 04/20/19: s/p TAVR wtih a 23mm Medtronic CoreValve Evolut Pro via the TF approach   Severe aortic stenosis    Splenomegaly 09/16/2016   Thrombocytopenia (HCC)    Thyroid disease    Tobacco abuse    Vitamin D deficiency    Past Surgical History:  Procedure Laterality Date   BIOPSY  04/04/2022   Procedure: BIOPSY;  Surgeon: Lemar Lofty., MD;  Location: [facility];  Service: Gastroenterology;;   CARDIAC CATHETERIZATION     COLONOSCOPY WITH PROPOFOL N/A 04/09/2018   Procedure: COLONOSCOPY WITH PROPOFOL;  Surgeon: Rachael Fee, MD;  Location: [facility];  Service: Endoscopy;  Laterality: N/A;   COLONOSCOPY WITH PROPOFOL N/A 04/04/2022   Procedure: COLONOSCOPY WITH PROPOFOL;  Surgeon: Meridee Score Netty Starring., MD;  Location: [facility];  Service: Gastroenterology;  Laterality: N/A;   CORONARY ANGIOPLASTY     CORONARY STENT INTERVENTION N/A 02/11/2019   Procedure: CORONARY STENT INTERVENTION;  Surgeon: Kathleene Hazel, MD;  Location: [facility];  Service: Cardiovascular;  Laterality: N/A;  Prox RCA Prox LAD   ESOPHAGOGASTRODUODENOSCOPY (EGD) WITH PROPOFOL N/A 04/09/2018   Procedure: ESOPHAGOGASTRODUODENOSCOPY (EGD) WITH PROPOFOL;  Surgeon: Rachael Fee, MD;  Location: [facility];  Service: Endoscopy;   Laterality: N/A;   ESOPHAGOGASTRODUODENOSCOPY (EGD) WITH PROPOFOL N/A 04/04/2022   Procedure: ESOPHAGOGASTRODUODENOSCOPY (EGD) WITH PROPOFOL;  Surgeon: Meridee Score Netty Starring., MD;  Location: [facility];  Service: Gastroenterology;  Laterality: N/A;   oopherectomy     OVARIAN CYST REMOVAL     POLYPECTOMY  04/09/2018   Procedure: POLYPECTOMY;  Surgeon: Rachael Fee, MD;  Location: [facility];  Service: Endoscopy;;   POLYPECTOMY  04/04/2022   Procedure: POLYPECTOMY;  Surgeon: Lemar Lofty., MD;  Location: [facility];  Service: Gastroenterology;;   RIGHT/LEFT HEART CATH AND CORONARY ANGIOGRAPHY N/A 10/07/2018   Procedure: RIGHT/LEFT HEART CATH AND CORONARY ANGIOGRAPHY;  Surgeon: Kathleene Hazel, MD;  Location: [facility];  Service: Cardiovascular;  Laterality: N/A;   TEE WITHOUT CARDIOVERSION N/A 04/20/2019   Procedure: TRANSESOPHAGEAL ECHOCARDIOGRAM (TEE);  Surgeon: Kathleene Hazel, MD;  Location: [facility] OR;  Service: Open Heart Surgery;  Laterality: N/A;   TRANSCATHETER AORTIC VALVE REPLACEMENT, TRANSFEMORAL N/A 04/20/2019   Procedure: TRANSCATHETER AORTIC VALVE REPLACEMENT, TRANSFEMORAL approach using 23mm Evolut PRO+ medtronic valve;  Surgeon: Kathleene Hazel, MD;  Location: [facility];  Service: Open Heart Surgery;  Laterality: N/A;   Family History  Problem Relation Age of Onset   Alzheimer's disease Mother    Dementia Mother    Other Mother        twisted colon    Lung cancer Father    Heart disease Father        MI at age 31   Alcohol abuse Father    Stomach cancer Neg Hx    Colon cancer Neg Hx    Esophageal cancer Neg Hx    Pancreatic cancer Neg Hx    Inflammatory bowel disease Neg Hx    Liver disease Neg Hx    Rectal cancer Neg Hx    Social History   Socioeconomic History   Marital status: Married    Spouse name: Not on file   Number of children: 1   Years of education: Not on file   Highest education level: Not on file   Occupational History   Not on file  Tobacco Use   Smoking status: Every Day    Current packs/day: 0.50    Types: Cigarettes   Smokeless tobacco: Never  Vaping Use   Vaping status: Never Used  Substance and Sexual Activity   Alcohol use: No   Drug use: Not Currently    Types: Benzodiazepines   Sexual activity: Not Currently  Other Topics Concern   Not on file  Social History Narrative   Married x 53 years in 2023.   1 son   2 grandsons   Social Drivers of Corporate investment banker Strain: Low Risk  (07/24/2023)   Overall Financial Resource Strain (CARDIA)    Difficulty of Paying Living Expenses: Not very hard  Food Insecurity: No Food Insecurity (07/24/2023)   Hunger Vital Sign    Worried About Running Out of Food in the Last Year: Never true    Ran Out of Food in the Last Year: Never true  Transportation Needs: No Transportation Needs (07/24/2023)   PRAPARE - Administrator, Civil Service (Medical): No    Lack of Transportation (Non-Medical): No  Physical Activity: Inactive (07/24/2023)   Exercise Vital Sign    Days of Exercise per Week: 0 days    Minutes of Exercise per Session: 0 min  Stress: Stress Concern Present (07/24/2023)   [employer] of Occupational Health - Occupational Stress Questionnaire    Feeling of Stress : To some extent  Social Connections: Socially Isolated (07/24/2023)   Social Connection and Isolation Panel [NHANES]    Frequency of Communication with Friends and Family: Never    Frequency of Social Gatherings with Friends and Family: Never    Attends Religious Services: Never    Diplomatic Services operational officer: No    Attends Engineer, structural: Never    Marital Status: Married    Tobacco Counseling Ready to quit: Not Answered Counseling given: Not Answered   Clinical Intake:  Pre-visit preparation completed: Yes  Pain : No/denies pain     Diabetes: No  How often do you need to have someone  help you when you read instructions, pamphlets, or other written materials from your doctor or pharmacy?: 1 - Never  Interpreter Needed?: No  Information entered by :: Remi Haggard LPN   Activities of Daily Living    07/24/2023   12:16 PM  In your present state of health, do you have any difficulty  performing the following activities:  Hearing? 0  Vision? 0  Difficulty concentrating or making decisions? 0  Walking or climbing stairs? 1  Dressing or bathing? 0  Doing errands, shopping? 0  Preparing Food and eating ? N  Using the Toilet? N  In the past six months, have you accidently leaked urine? Y  Do you have problems with loss of bowel control? N  Managing your Medications? N  Managing your Finances? N  Housekeeping or managing your Housekeeping? N    Patient Care Team: Donita Brooks, MD as PCP - General (Family Medicine) Kathleene Hazel, MD as PCP - Cardiology (Cardiology) Erroll Luna, [facility] (Inactive) as Pharmacist (Pharmacist)  Indicate any recent Medical Services you [place]as have received from other than [facility] providers in the past year (date [place] be approximate).     Assessment:   This is a routine wellness examination for [place].  Hearing/Vision screen Hearing Screening - Comments:: No trouble hearing Vision Screening - Comments:: Up to date [place]   Goals Addressed             This Visit's Progress    Patient Stated       Eat healthier       Depression Screen    07/24/2023   12:19 PM 01/16/2023   10:47 AM 10/15/2022    9:24 AM 07/18/2022    1:39 PM 05/21/2022   11:32 AM 07/12/2021   11:13 AM 09/13/2020   11:54 AM  PHQ 2/9 Scores  PHQ - 2 Score 4 0 0 2 2 2  0  PHQ- 9 Score 9   9 8 4      Fall Risk    07/24/2023   12:49 PM 01/16/2023   10:47 AM 10/15/2022    9:23 AM 07/18/2022    1:32 PM 07/12/2021   11:24 AM  Fall Risk   Falls in the past year? 0 1 1 0 1  Number falls in past yr: 0 0 0 0 0  Injury with Fall? 0 0 1 0 0  Risk  for fall due to :  Impaired balance/gait;History of fall(s) History of fall(s);Impaired balance/gait;Orthopedic patient No Fall Risks Impaired balance/gait;Impaired mobility  Follow up Falls evaluation completed;Education provided;Falls prevention discussed Education provided;Falls prevention discussed Education provided;Falls prevention discussed Falls evaluation completed;Education provided;Falls prevention discussed Falls prevention discussed    MEDICARE RISK AT HOME: Medicare Risk at Home Any stairs in or around the home?: No If so, are there any without handrails?: No Home free of loose throw rugs in walkways, pet beds, electrical cords, etc?: Yes Adequate lighting in your home to reduce risk of falls?: Yes Life alert?: No Use of a cane, walker or w/c?: No Grab bars in the bathroom?: No Shower chair or bench in shower?: No Elevated toilet seat or a handicapped toilet?: No  TIMED UP AND GO:  Was the test performed?  No    Cognitive Function:        07/24/2023   12:27 PM 07/18/2022    1:38 PM 07/12/2021   11:34 AM  6CIT Screen  What Year? 4 points 0 points 0 points  What month? 0 points 3 points 0 points  What time? 0 points 0 points 0 points  Count back from 20 2 points 0 points 0 points  Months in reverse 2 points 2 points 0 points  Repeat phrase 0 points 6 points 0 points  Total Score 8 points 11 points 0 points    Immunizations Immunization History  Administered Date(s) Administered   Fluad Quad(high Dose 65+) 04/21/2019, 04/28/2020, 08/09/2022   Influenza, High Dose Seasonal PF 06/02/2017, 04/15/2018   Influenza-Unspecified 06/05/2016   PFIZER(Purple Top)SARS-COV-2 Vaccination 07/10/2020   Pneumococcal Polysaccharide-23 04/21/2019    TDAP status: Due, Education has been provided regarding the importance of this vaccine. Advised [place] receive this vaccine at local pharmacy or Health Dept. Aware to provide a copy of the vaccination record if obtained from local  pharmacy or Health Dept. Verbalized acceptance and understanding.  Flu Vaccine status: Due, Education has been provided regarding the importance of this vaccine. Advised [place] receive this vaccine at local pharmacy or Health Dept. Aware to provide a copy of the vaccination record if obtained from local pharmacy or Health Dept. Verbalized acceptance and understanding.  Pneumococcal vaccine status: Due, Education has been provided regarding the importance of this vaccine. Advised [place] receive this vaccine at local pharmacy or Health Dept. Aware to provide a copy of the vaccination record if obtained from local pharmacy or Health Dept. Verbalized acceptance and understanding.  Covid-19 vaccine status: Information provided on how to obtain vaccines.   Qualifies for Shingles Vaccine? Yes   Zostavax completed No   Shingrix Completed?: No.    Education has been provided regarding the importance of this vaccine. Patient has been advised to call insurance company to determine out of pocket expense if they have not yet received this vaccine. Advised [place] also receive vaccine at local pharmacy or Health Dept. Verbalized acceptance and understanding.  Screening Tests Health Maintenance  Topic Date Due   Diabetic kidney evaluation - Urine ACR  Never done   HEMOGLOBIN A1C  04/17/2023   COVID-19 Vaccine (2 - 2024-25 season) 08/09/2023 (Originally 04/06/2023)   Pneumonia Vaccine 44+ Years old (2 of 2 - PCV) 10/15/2023 (Originally 04/20/2020)   MAMMOGRAM  10/15/2023 (Originally 06/14/2019)   DEXA SCAN  10/15/2023 (Originally 10/11/2013)   Zoster Vaccines- Shingrix (1 of 2) 10/22/2023 (Originally 10/12/1998)   INFLUENZA VACCINE  11/03/2023 (Originally 03/06/2023)   OPHTHALMOLOGY EXAM  11/13/2023 (Originally 10/12/1960)   Diabetic kidney evaluation - eGFR measurement  01/16/2024   FOOT EXAM  01/21/2024   Medicare Annual Wellness (AWV)  07/23/2024   Colonoscopy  04/04/2032   Hepatitis C Screening  Completed   HPV VACCINES   Aged Out   DTaP/Tdap/Td  Discontinued    Health Maintenance  Health Maintenance Due  Topic Date Due   Diabetic kidney evaluation - Urine ACR  Never done   HEMOGLOBIN A1C  04/17/2023    Colorectal cancer screening: Type of screening: Colonoscopy. Completed 2033. Repeat every 10 years  Mammogram Education provided  Bone Density  Education provided  Lung Cancer Screening: (Low Dose CT Chest recommended if Age 51-80 years, 20 pack-year currently smoking OR have quit w/in 15years.) does not qualify.   Lung Cancer Screening Referral:   Additional Screening:  Hepatitis C Screening: does not qualify; Completed 2024  Vision Screening: Recommended annual ophthalmology exams for early detection of glaucoma and other disorders of the eye. Is the patient up to date with their annual eye exam?  No  Who is the provider or what is the name of the office in which the patient attends annual eye exams? [email or web address] If pt is not established with a provider, would they like to be referred to a provider to establish care? No .   Dental Screening: Recommended annual dental exams for proper oral hygiene   Community Resource Referral / Chronic Care Management: CRR required this visit?  No   CCM required this visit?  No     Plan:     I have personally reviewed and noted the following in the patient's chart:   Medical and social history Use of alcohol, tobacco or illicit drugs  Current medications and supplements including opioid prescriptions. Patient is not currently taking opioid prescriptions. Functional ability and status Nutritional status Physical activity Advanced directives List of other physicians Hospitalizations, surgeries, and ER visits in previous 12 months Vitals Screenings to include cognitive, depression, and falls Referrals and appointments  In addition, I have reviewed and discussed with patient certain preventive protocols, quality metrics, and best practice  recommendations. A written personalized care plan for preventive services as well as general preventive health recommendations were provided to patient.     Remi Haggard, LPN   [id number]/05/[id number]   After Visit Summary: (MyChart) Due to this being a telephonic visit, the after visit summary with patients personalized plan was offered to patient via MyChart   Nurse Notes: Patient was complaining of some leg pain.  Stated it has been going on for months.  She did not want to make an appointment at this time.

## 2023-07-24 NOTE — Telephone Encounter (Signed)
Requested Prescriptions  Pending Prescriptions Disp Refills   FLUoxetine (PROZAC) 40 MG capsule [Pharmacy Med Name: FLUOXETINE HCL 40 MG CAPSULE] 90 capsule 0    Sig: TAKE 1 CAPSULE (40 MG TOTAL) BY MOUTH DAILY.     Psychiatry:  Antidepressants - SSRI Failed - 07/24/2023  9:32 AM      Failed - Valid encounter within last 6 months    Recent Outpatient Visits           1 year ago Hematuria, unspecified type   [facility] Donita Brooks, MD   2 years ago Type 2 diabetes mellitus with other specified complication, without long-term current use of insulin (HCC)   [facility] Pickard, Priscille Heidelberg, MD   2 years ago Situational anxiety   [facility] Tanya Nones, Priscille Heidelberg, MD   2 years ago Lower abdominal pain   [facility] Donita Brooks, MD   2 years ago Type 2 diabetes mellitus with other specified complication, without long-term current use of insulin (HCC)   [facility] Pickard, Priscille Heidelberg, MD              Passed - Completed PHQ-2 or PHQ-9 in the last 360 days

## 2023-07-24 NOTE — Patient Instructions (Signed)
Kelly [name] , Thank you for taking time to come for your Medicare Wellness Visit. I appreciate your ongoing commitment to your health goals. Please review the following plan we discussed and let me know if I can assist you in the future.   Screening recommendations/referrals: Colonoscopy: Education provided Mammogram: Education provided Bone Density: Education provided Recommended yearly ophthalmology/optometry visit for glaucoma screening and checkup Recommended yearly dental visit for hygiene and checkup  Vaccinations: Influenza vaccine: Education provided Pneumococcal vaccine: Education provided Tdap vaccine: Education provided Shingles vaccine: Education provided      Preventive Care 65 Years and Older, Female Preventive care refers to lifestyle choices and visits with your health care provider that can promote health and wellness. What does preventive care include? A yearly physical exam. This is also called an annual well check. Dental exams once or twice a year. Routine eye exams. Ask your health care provider how often you should have your eyes checked. Personal lifestyle choices, including: Daily care of your teeth and gums. Regular physical activity. Eating a healthy diet. Avoiding tobacco and drug use. Limiting alcohol use. Practicing safe sex. Taking low-dose aspirin every day. Taking vitamin and mineral supplements as recommended by your health care provider. What happens during an annual well check? The services and screenings done by your health care provider during your annual well check will depend on your age, overall health, lifestyle risk factors, and family history of disease. Counseling  Your health care provider Goelz ask you questions about your: Alcohol use. Tobacco use. Drug use. Emotional well-being. Home and relationship well-being. Sexual activity. Eating habits. History of falls. Memory and ability to understand (cognition). Work and work  Astronomer. Reproductive health. Screening  You Malak have the following tests or measurements: Height, weight, and BMI. Blood pressure. Lipid and cholesterol levels. These Matsuo be checked every 5 years, or more frequently if you are over 48 years old. Skin check. Lung cancer screening. You Mikkelson have this screening every year starting at age 78 if you have a 30-pack-year history of smoking and currently smoke or have quit within the past 15 years. Fecal occult blood test (FOBT) of the stool. You Woehler have this test every year starting at age 57. Flexible sigmoidoscopy or colonoscopy. You Singleton have a sigmoidoscopy every 5 years or a colonoscopy every 10 years starting at age 73. Hepatitis C blood test. Hepatitis B blood test. Sexually transmitted disease (STD) testing. Diabetes screening. This is done by checking your blood sugar (glucose) after you have not eaten for a while (fasting). You Dufner have this done every 1-3 years. Bone density scan. This is done to screen for osteoporosis. You Erisman have this done starting at age 32. Mammogram. This Stetzel be done every 1-2 years. Talk to your health care provider about how often you should have regular mammograms. Talk with your health care provider about your test results, treatment options, and if necessary, the need for more tests. Vaccines  Your health care provider Huss recommend certain vaccines, such as: Influenza vaccine. This is recommended every year. Tetanus, diphtheria, and acellular pertussis (Tdap, Td) vaccine. You Mancebo need a Td booster every 10 years. Zoster vaccine. You Simerly need this after age 78. Pneumococcal 13-valent conjugate (PCV13) vaccine. One dose is recommended after age 27. Pneumococcal polysaccharide (PPSV23) vaccine. One dose is recommended after age 60. Talk to your health care provider about which screenings and vaccines you need and how often you need them. This information is not intended to replace advice  given to you by  your health care provider. Make sure you discuss any questions you have with your health care provider. Document Released: 08/18/2015 Document Revised: 04/10/2016 Document Reviewed: 05/23/2015 Elsevier Interactive Patient Education  2017 [employer].  Fall Prevention in the Home Falls can cause injuries. They can happen to people of all ages. There are many things you can do to make your home safe and to help prevent falls. What can I do on the outside of my home? Regularly fix the edges of walkways and driveways and fix any cracks. Remove anything that might make you trip as you walk through a door, such as a raised step or threshold. Trim any bushes or trees on the path to your home. Use bright outdoor lighting. Clear any walking paths of anything that might make someone trip, such as rocks or tools. Regularly check to see if handrails are loose or broken. Make sure that both sides of any steps have handrails. Any raised decks and porches should have guardrails on the edges. Have any leaves, snow, or ice cleared regularly. Use sand or salt on walking paths during winter. Clean up any spills in your garage right away. This includes oil or grease spills. What can I do in the bathroom? Use night lights. Install grab bars by the toilet and in the tub and shower. Do not use towel bars as grab bars. Use non-skid mats or decals in the tub or shower. If you need to sit down in the shower, use a plastic, non-slip stool. Keep the floor dry. Clean up any water that spills on the floor as soon as it happens. Remove soap buildup in the tub or shower regularly. Attach bath mats securely with double-sided non-slip rug tape. Do not have throw rugs and other things on the floor that can make you trip. What can I do in the bedroom? Use night lights. Make sure that you have a light by your bed that is easy to reach. Do not use any sheets or blankets that are too big for your bed. They should not hang  down onto the floor. Have a firm chair that has side arms. You can use this for support while you get dressed. Do not have throw rugs and other things on the floor that can make you trip. What can I do in the kitchen? Clean up any spills right away. Avoid walking on wet floors. Keep items that you use a lot in easy-to-reach places. If you need to reach something above you, use a strong step stool that has a grab bar. Keep electrical cords out of the way. Do not use floor polish or wax that makes floors slippery. If you must use wax, use non-skid floor wax. Do not have throw rugs and other things on the floor that can make you trip. What can I do with my stairs? Do not leave any items on the stairs. Make sure that there are handrails on both sides of the stairs and use them. Fix handrails that are broken or loose. Make sure that handrails are as long as the stairways. Check any carpeting to make sure that it is firmly attached to the stairs. Fix any carpet that is loose or worn. Avoid having throw rugs at the top or bottom of the stairs. If you do have throw rugs, attach them to the floor with carpet tape. Make sure that you have a light switch at the top of the stairs and the bottom of  the stairs. If you do not have them, ask someone to add them for you. What else can I do to help prevent falls? Wear shoes that: Do not have high heels. Have rubber bottoms. Are comfortable and fit you well. Are closed at the toe. Do not wear sandals. If you use a stepladder: Make sure that it is fully opened. Do not climb a closed stepladder. Make sure that both sides of the stepladder are locked into place. Ask someone to hold it for you, if possible. Clearly mark and make sure that you can see: Any grab bars or handrails. First and last steps. Where the edge of each step is. Use tools that help you move around (mobility aids) if they are needed. These  include: Canes. Walkers. Scooters. Crutches. Turn on the lights when you go into a dark area. Replace any light bulbs as soon as they burn out. Set up your furniture so you have a clear path. Avoid moving your furniture around. If any of your floors are uneven, fix them. If there are any pets around you, be aware of where they are. Review your medicines with your doctor. Some medicines can make you feel dizzy. This can increase your chance of falling. Ask your doctor what other things that you can do to help prevent falls. This information is not intended to replace advice given to you by your health care provider. Make sure you discuss any questions you have with your health care provider. Document Released: 05/18/2009 Document Revised: 12/28/2015 Document Reviewed: 08/26/2014 Elsevier Interactive Patient Education  2017 [employer].

## 2023-10-19 ENCOUNTER — Other Ambulatory Visit: Payer: Self-pay | Admitting: Family Medicine

## 2023-10-21 NOTE — Telephone Encounter (Signed)
 Requested medications are due for refill today.  yes  Requested medications are on the active medications list.  yes  Last refill. 08/16/2022 #90 2 rf  Future visit scheduled.   no  Notes to clinic.  Pt is more than 3 months overdue for an OV.    Requested Prescriptions  Pending Prescriptions Disp Refills   traZODone (DESYREL) 50 MG tablet [Pharmacy Med Name: TRAZODONE 50 MG TABLET] 90 tablet 2    Sig: TAKE 1 TABLET BY MOUTH EVERY DAY AT BEDTIME AS NEEDED     Psychiatry: Antidepressants - Serotonin Modulator Failed - 10/21/2023  8:28 AM      Failed - Valid encounter within last 6 months    Recent Outpatient Visits           1 year ago Hematuria, unspecified type   [facility] Donita Brooks, MD   2 years ago Type 2 diabetes mellitus with other specified complication, without long-term current use of insulin (HCC)   [facility] Pickard, Priscille Heidelberg, MD   2 years ago Situational anxiety   [facility] Pickard, Priscille Heidelberg, MD   2 years ago Lower abdominal pain   [facility] Donita Brooks, MD   3 years ago Type 2 diabetes mellitus with other specified complication, without long-term current use of insulin (HCC)   [facility] Pickard, Priscille Heidelberg, MD              Passed - Completed PHQ-2 or PHQ-9 in the last 360 days

## 2023-11-19 ENCOUNTER — Other Ambulatory Visit: Payer: Self-pay

## 2023-11-19 ENCOUNTER — Telehealth: Payer: Self-pay

## 2023-11-19 DIAGNOSIS — F332 Major depressive disorder, recurrent severe without psychotic features: Secondary | ICD-10-CM

## 2023-11-19 MED ORDER — FLUOXETINE HCL 40 MG PO CAPS: 40.0000 mg | ORAL_CAPSULE | Freq: Every day | ORAL | 0 refills | Status: AC

## 2023-11-19 NOTE — Telephone Encounter (Signed)
 Prescription Request  11/19/2023  LOV: 07/24/23  What is the name of the medication or equipment? FLUoxetine (PROZAC) 40 MG capsule [[id number]]   Have you contacted your pharmacy to request a refill? Yes   Which pharmacy would you like this sent to?  [id number] [place] Nest[place], [place] - [address] [place] [address] Phone: [phone] Fax: [phone]    Patient notified that their request is being sent to the clinical staff for review and that they should receive a response within 2 business days.   Please advise at [facility] [phone]

## 2023-12-23 ENCOUNTER — Ambulatory Visit: Admitting: Podiatry

## 2023-12-23 ENCOUNTER — Encounter: Payer: Self-pay | Admitting: Podiatry

## 2023-12-23 DIAGNOSIS — B351 Tinea unguium: Secondary | ICD-10-CM | POA: Diagnosis not present

## 2023-12-23 DIAGNOSIS — M79675 Pain in left toe(s): Secondary | ICD-10-CM | POA: Diagnosis not present

## 2023-12-23 DIAGNOSIS — D696 Thrombocytopenia, unspecified: Secondary | ICD-10-CM | POA: Diagnosis not present

## 2023-12-23 DIAGNOSIS — M79674 Pain in right toe(s): Secondary | ICD-10-CM

## 2023-12-23 DIAGNOSIS — E119 Type 2 diabetes mellitus without complications: Secondary | ICD-10-CM | POA: Diagnosis not present

## 2023-12-23 NOTE — Progress Notes (Signed)
This patient returns to my office for at risk foot care.  This patient requires this care by a professional since this patient will be at risk due to having thrombocytopenia and diabetes.  This patient is unable to cut nails herself since the patient cannot reach her nails.These nails are painful walking and wearing shoes.  This patient presents for at risk foot care today. ? ?General Appearance  Alert, conversant and in no acute stress. ? ?Vascular  Dorsalis pedis and posterior tibial  pulses are palpable  bilaterally.  Capillary return is within normal limits  bilaterally. Temperature is within normal limits  bilaterally. ? ?Neurologic  Senn-Weinstein monofilament wire test within normal limits  bilaterally. Muscle power within normal limits bilaterally. ? ?Nails Thick disfigured discolored nails with subungual debris  from hallux to fifth toes bilaterally. No evidence of bacterial infection or drainage bilaterally. ? ?Orthopedic  No limitations of motion  feet .  No crepitus or effusions noted.  No bony pathology or digital deformities noted.  HAV  B/L ? ?Skin  normotropic skin with no porokeratosis noted bilaterally.  No signs of infections or ulcers noted.    ? ?Onychomycosis  Pain in right toes  Pain in left toes ? ?Consent was obtained for treatment procedures.   Mechanical debridement of nails 1-5  bilaterally performed with a nail nipper.  Filed with dremel without incident.  ? ? ?Return office visit  3 months                    Told patient to return for periodic foot care and evaluation due to potential at risk complications. ? ? ?Ever Gustafson DPM  ?

## 2024-03-18 ENCOUNTER — Telehealth: Payer: Self-pay | Admitting: Family Medicine

## 2024-03-18 NOTE — Telephone Encounter (Signed)
 Prescription Request  03/18/2024  LOV: 01/16/2023  What is the name of the medication or equipment?   traZODone (DESYREL) 50 MG tablet  **new script requested**  Have you contacted your pharmacy to request a refill? Yes   Which pharmacy would you like this sent to?  [facility] - [place], [place] - [address] [place] [address] Phone: [phone] Fax: [phone]    Patient notified that their request is being sent to the clinical staff for review and that they should receive a response within 2 business days.   Please advise pharmacist.

## 2024-03-19 ENCOUNTER — Telehealth: Payer: Self-pay | Admitting: Family Medicine

## 2024-03-19 NOTE — Telephone Encounter (Signed)
 Prescription Request  03/19/2024  LOV: Visit date not found  What is the name of the medication or equipment?   traZODone (DESYREL) 50 MG tablet  **new script requested 03/25/24**  Have you contacted your pharmacy to request a refill? Yes   Which pharmacy would you like this sent to?  [facility] - [place], [place] - [address] [place] [address] Phone: [phone] Fax: [phone]    Patient notified that their request is being sent to the clinical staff for review and that they should receive a response within 2 business days.   Please advise pharmacist.

## 2024-03-25 ENCOUNTER — Other Ambulatory Visit: Payer: Self-pay | Admitting: Family Medicine

## 2024-03-25 MED ORDER — TRAZODONE HCL 50 MG PO TABS: ORAL_TABLET | ORAL | 2 refills | Status: AC

## 2024-04-12 ENCOUNTER — Encounter: Payer: Self-pay | Admitting: Family Medicine

## 2024-04-12 ENCOUNTER — Ambulatory Visit (INDEPENDENT_AMBULATORY_CARE_PROVIDER_SITE_OTHER): Admitting: Family Medicine

## 2024-04-12 VITALS — BP 158/82 | HR 61 | Temp 98.1°F | Ht 65.0 in | Wt 171.0 lb

## 2024-04-12 DIAGNOSIS — E039 Hypothyroidism, unspecified: Secondary | ICD-10-CM | POA: Diagnosis not present

## 2024-04-12 DIAGNOSIS — E1169 Type 2 diabetes mellitus with other specified complication: Secondary | ICD-10-CM | POA: Diagnosis not present

## 2024-04-12 DIAGNOSIS — Z952 Presence of prosthetic heart valve: Secondary | ICD-10-CM | POA: Diagnosis not present

## 2024-04-12 DIAGNOSIS — K7581 Nonalcoholic steatohepatitis (NASH): Secondary | ICD-10-CM | POA: Diagnosis not present

## 2024-04-12 DIAGNOSIS — K7460 Unspecified cirrhosis of liver: Secondary | ICD-10-CM | POA: Diagnosis not present

## 2024-04-12 MED ORDER — LEVOTHYROXINE SODIUM 88 MCG PO TABS: 88.0000 ug | ORAL_TABLET | Freq: Every day | ORAL | 3 refills | Status: AC

## 2024-04-12 MED ORDER — NADOLOL 20 MG PO TABS: 20.0000 mg | ORAL_TABLET | Freq: Every day | ORAL | 3 refills | Status: AC

## 2024-04-12 MED ORDER — SPIRONOLACTONE 25 MG PO TABS: 25.0000 mg | ORAL_TABLET | Freq: Every day | ORAL | 3 refills | Status: AC

## 2024-04-12 NOTE — Progress Notes (Signed)
 Patient is here today for follow-up.  She has a history of cirrhosis secondary to nonalcoholic steatohepatitis.  She has a history of upper GI bleeding secondary to AVMs as well as esophageal varices.  She has a history of aortic valve replacement due to severe aortic valve stenosis.  She has a history of type 2 diabetes, hypertension, hypothyroidism.  Patient also has a history of dementia.  Approximately 6 months ago, the patient discontinue all of her medication.  She now states that she feels tired and weak.  This prompted her to return to the office.  Here today her weight is up considerably from her last visit.  Her blood pressure is also elevated. Wt Readings from Last 3 Encounters:  04/12/24 171 lb (77.6 kg)  01/16/23 145 lb 9.6 oz (66 kg)  12/20/22 167 lb 6.4 oz (75.9 kg)   Although her weight is up 26 pounds.  I believe she is about 10 pounds up from her baseline.  She denies any chest pain or shortness of breath.  She denies any dyspnea on exertion.  She does report fatigue and lack of energy. Past Medical History:  Diagnosis Date   Anxiety    Chronic upper GI bleeding    intestinal AVM's, portal gastropathy, esophageal varices on egd 04/2018   Coronary artery disease involving native coronary artery without angina pectoris    Dementia (HCC) 09/16/2016   Depression    DIVERTICULITIS OF COLON 06/14/2008   Qualifier: Diagnosis of  By: Tivis RN, Channing     DM (diabetes mellitus), type 2 (HCC) 09/15/2016   Esophageal varices determined by endoscopy (HCC)    Hyperlipidemia    Hypertension    Hypothyroidism    Incidental pulmonary nodule, greater than or equal to 8mm 10/14/2018   LLL nodule needs f/u CT or PET-CT scan   Irritable bowel syndrome 04/06/2008   Centricity Description: IBS Qualifier: Diagnosis of  By: Ezzard CMA (NANNIE), [name]   Centricity Description: IRRITABLE BOWEL SYNDROME Qualifier: Diagnosis of  By: Teressa MD, Toribio SQUIBB    Liver cirrhosis secondary to NASH (HCC) 09/16/2016    NASH (nonalcoholic steatohepatitis)    Obesity    Portal hypertensive gastropathy (HCC)    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 04/20/19: s/p TAVR wtih a 23mm Medtronic CoreValve Evolut Pro via the TF approach   Severe aortic stenosis    Splenomegaly 09/16/2016   Thrombocytopenia (HCC)    Thyroid  disease    Tobacco abuse    Vitamin D deficiency    Past Surgical History:  Procedure Laterality Date   BIOPSY  04/04/2022   Procedure: BIOPSY;  Surgeon: Wilhelmenia Aloha Raddle., MD;  Location: [facility];  Service: Gastroenterology;;   CARDIAC CATHETERIZATION     COLONOSCOPY WITH PROPOFOL  N/A 04/09/2018   Procedure: COLONOSCOPY WITH PROPOFOL ;  Surgeon: Teressa Toribio SQUIBB, MD;  Location: [facility];  Service: Endoscopy;  Laterality: N/A;   COLONOSCOPY WITH PROPOFOL  N/A 04/04/2022   Procedure: COLONOSCOPY WITH PROPOFOL ;  Surgeon: Wilhelmenia Aloha Raddle., MD;  Location: [facility];  Service: Gastroenterology;  Laterality: N/A;   CORONARY ANGIOPLASTY     CORONARY STENT INTERVENTION N/A 02/11/2019   Procedure: CORONARY STENT INTERVENTION;  Surgeon: Verlin Lonni BIRCH, MD;  Location: [facility];  Service: Cardiovascular;  Laterality: N/A;  Prox RCA Prox LAD   ESOPHAGOGASTRODUODENOSCOPY (EGD) WITH PROPOFOL  N/A 04/09/2018   Procedure: ESOPHAGOGASTRODUODENOSCOPY (EGD) WITH PROPOFOL ;  Surgeon: Teressa Toribio SQUIBB, MD;  Location: [facility];  Service: Endoscopy;  Laterality: N/A;   ESOPHAGOGASTRODUODENOSCOPY (EGD) WITH PROPOFOL  N/A 04/04/2022   Procedure: ESOPHAGOGASTRODUODENOSCOPY (EGD) WITH PROPOFOL ;  Surgeon: Wilhelmenia Aloha Raddle., MD;  Location: [facility];  Service: Gastroenterology;  Laterality: N/A;   oopherectomy     OVARIAN CYST REMOVAL     POLYPECTOMY  04/09/2018   Procedure: POLYPECTOMY;  Surgeon: Teressa Toribio SQUIBB, MD;  Location: [facility];  Service: Endoscopy;;   POLYPECTOMY  04/04/2022   Procedure: POLYPECTOMY;  Surgeon: Wilhelmenia Aloha Raddle., MD;   Location: [facility];  Service: Gastroenterology;;   RIGHT/LEFT HEART CATH AND CORONARY ANGIOGRAPHY N/A 10/07/2018   Procedure: RIGHT/LEFT HEART CATH AND CORONARY ANGIOGRAPHY;  Surgeon: Verlin Lonni BIRCH, MD;  [facility];  Service: Cardiovascular;  Laterality: N/A;   TEE WITHOUT CARDIOVERSION N/A 04/20/2019   Procedure: TRANSESOPHAGEAL ECHOCARDIOGRAM (TEE);  Surgeon: Verlin Lonni BIRCH, MD;  Location: [facility] OR;  Service: Open Heart Surgery;  Laterality: N/A;   TRANSCATHETER AORTIC VALVE REPLACEMENT, TRANSFEMORAL N/A 04/20/2019   Procedure: TRANSCATHETER AORTIC VALVE REPLACEMENT, TRANSFEMORAL approach using 23mm Evolut PRO+ medtronic valve;  Surgeon: Verlin Lonni BIRCH, MD;  Location: [facility];  Service: Open Heart Surgery;  Laterality: N/A;   Current Outpatient Medications on File Prior to Visit  Medication Sig Dispense Refill   ACCU-CHEK AVIVA PLUS test strip  (Patient not taking: Reported on 04/12/2024)     ACCU-CHEK SOFTCLIX LANCETS lancets  (Patient not taking: Reported on 04/12/2024)     atorvastatin  (LIPITOR) 20 MG tablet TAKE 1 TABLET EVERY DAY (Patient not taking: Reported on 04/12/2024) 90 tablet 3   Blood Glucose Monitoring Suppl (ACCU-CHEK AVIVA PLUS) w/Device KIT  (Patient not taking: Reported on 04/12/2024)     cyanocobalamin  (VITAMIN B12) 1000 MCG tablet Take 1 tablet (1,000 mcg total) by mouth daily. (Patient not taking: Reported on 04/12/2024) 30 tablet 3   FLUoxetine  (PROZAC ) 40 MG capsule Take 1 capsule (40 mg total) by mouth daily. Appointment needed w PCP. (Patient not taking: Reported on 04/12/2024) 90 capsule 0   furosemide  (LASIX ) 20 MG tablet Take 20 mg by mouth daily. (Patient not taking: Reported on 04/12/2024)     Iron , Ferrous Sulfate , 325 (65 Fe) MG TABS Take 325 mg by mouth daily. (Patient not taking: Reported on 04/12/2024) 30 tablet 3   losartan  (COZAAR ) 100 MG tablet TAKE 1 TABLET EVERY DAY (Patient not taking: Reported on 04/12/2024) 90 tablet 1    pantoprazole  (PROTONIX ) 40 MG tablet TAKE 1 TABLET EVERY DAY (Patient not taking: Reported on 04/12/2024) 90 tablet 3   potassium chloride  (KLOR-CON  M) 10 MEQ tablet Take 10 mEq by mouth 2 (two) times daily. (Patient not taking: Reported on 04/12/2024)     traZODone  (DESYREL ) 50 MG tablet TAKE 1 TABLET BY MOUTH EVERY DAY AT BEDTIME AS NEEDED (Patient not taking: Reported on 04/12/2024) 90 tablet 2   No current facility-administered medications on file prior to visit.    Allergies  Allergen Reactions   Prednisone  Itching    Heart rate increased   Celecoxib Rash   Macrodantin [Nitrofurantoin Macrocrystal] Rash   Social History   Socioeconomic History   Marital status: Married    Spouse name: Not on file   Number of children: 1   Years of education: Not on file   Highest education level: Not on file  Occupational History   Not on file  Tobacco Use   Smoking status: Every Day    Current packs/day: 0.50    Types: Cigarettes   Smokeless tobacco: Never  Vaping  Use   Vaping status: Never Used  Substance and Sexual Activity   Alcohol use: No   Drug use: Not Currently    Types: Benzodiazepines   Sexual activity: Not Currently  Other Topics Concern   Not on file  Social History Narrative   Married x 53 years in 2023.   1 son   2 grandsons   Social Drivers of Corporate investment banker Strain: Low Risk  (07/24/2023)   Overall Financial Resource Strain (CARDIA)    Difficulty of Paying Living Expenses: Not very hard  Food Insecurity: No Food Insecurity (07/24/2023)   Hunger Vital Sign    Worried About Running Out of Food in the Last Year: Never true    Ran Out of Food in the Last Year: Never true  Transportation Needs: No Transportation Needs (07/24/2023)   PRAPARE - Administrator, Civil Service (Medical): No    Lack of Transportation (Non-Medical): No  Physical Activity: Inactive (07/24/2023)   Exercise Vital Sign    Days of Exercise per Week: 0 days    Minutes of  Exercise per Session: 0 min  Stress: Stress Concern Present (07/24/2023)   [employer] of Occupational Health - Occupational Stress Questionnaire    Feeling of Stress : To some extent  Social Connections: Socially Isolated (07/24/2023)   Social Connection and Isolation Panel    Frequency of Communication with Friends and Family: Never    Frequency of Social Gatherings with Friends and Family: Never    Attends Religious Services: Never    Database administrator or Organizations: No    Attends Banker Meetings: Never    Marital Status: Married  Catering manager Violence: Not At Risk (07/24/2023)   Humiliation, Afraid, Rape, and Kick questionnaire    Fear of Current or Ex-Partner: No    Emotionally Abused: No    Physically Abused: No    Sexually Abused: No       Review of Systems     Objective:   Physical Exam Constitutional:      Appearance: Normal appearance. She is obese.  Cardiovascular:     Rate and Rhythm: Normal rate and regular rhythm.     Heart sounds: Murmur heard.  Pulmonary:     Effort: Pulmonary effort is normal. No respiratory distress.     Breath sounds: Normal breath sounds. No wheezing, rhonchi or rales.  Abdominal:     General: Bowel sounds are normal. There is no distension.     Palpations: Abdomen is soft. There is no mass.     Tenderness: There is no guarding or rebound.  Musculoskeletal:     Right lower leg: No edema.     Left lower leg: No edema.  Neurological:     Mental Status: She is alert.          Assessment & Plan:  Liver cirrhosis secondary to NASH (HCC) - Plan: CBC with Differential/Platelet, Comprehensive metabolic panel with GFR, Lipid panel, Vitamin B12, TSH, Hemoglobin A1c  Type 2 diabetes mellitus with other specified complication, without long-term current use of insulin  (HCC) - Plan: CBC with Differential/Platelet, Comprehensive metabolic panel with GFR, Lipid panel, Vitamin B12, TSH, Hemoglobin  A1c  Hypothyroidism, unspecified type - Plan: CBC with Differential/Platelet, Comprehensive metabolic panel with GFR, Lipid panel, Vitamin B12, TSH, Hemoglobin A1c  History of transcatheter aortic valve replacement (TAVR) - Plan: ECHOCARDIOGRAM COMPLETE Patient appears euvolemic today.  She does not appear fluid overloaded.  I will resume  spironolactone  25 mg daily due to her history of cirrhosis.  I will also resume nadolol  20 mg a day given her history of esophageal varices and because of her elevated blood pressure.  I plan to recheck her blood pressure in 2 to 3 weeks after she resumes these medications.  Obtain a baseline CMP today.  Recheck potassium and renal function in 2 weeks.  Given her history of bleeding AVMs, I will also check a CBC today.  Also check TSH and vitamin B12 given her severe fatigue.  I suspect this is likely due to her stopping levothyroxine .  Resume levothyroxine  88 mcg daily and check a baseline TSH today.  Schedule echocardiogram of the heart.  I will hold off adding additional medication until I see her back in 2 to 3 weeks.  Monitor her hemoglobin A1c to assess the control of her blood sugars

## 2024-04-13 ENCOUNTER — Ambulatory Visit: Payer: Self-pay | Admitting: Family Medicine

## 2024-04-13 LAB — CBC WITH DIFFERENTIAL/PLATELET
Absolute Lymphocytes: 1098 {cells}/uL (ref 850–3900)
Absolute Monocytes: 573 {cells}/uL (ref 200–950)
Basophils Absolute: 29 {cells}/uL (ref 0–200)
Basophils Relative: 0.6 %
Eosinophils Absolute: 191 {cells}/uL (ref 15–500)
Eosinophils Relative: 3.9 %
HCT: 30.1 % — ABNORMAL LOW (ref 35.0–45.0)
Hemoglobin: 10.3 g/dL — ABNORMAL LOW (ref 11.7–15.5)
MCH: 28.8 pg (ref 27.0–33.0)
MCHC: 34.2 g/dL (ref 32.0–36.0)
MCV: 84.1 fL (ref 80.0–100.0)
MPV: 10.8 fL (ref 7.5–12.5)
Monocytes Relative: 11.7 %
Neutro Abs: 3009 {cells}/uL (ref 1500–7800)
Neutrophils Relative %: 61.4 %
Platelets: 51 Thousand/uL — ABNORMAL LOW (ref 140–400)
RBC: 3.58 Million/uL — ABNORMAL LOW (ref 3.80–5.10)
RDW: 15.8 % — ABNORMAL HIGH (ref 11.0–15.0)
Total Lymphocyte: 22.4 %
WBC: 4.9 Thousand/uL (ref 3.8–10.8)

## 2024-04-13 LAB — LIPID PANEL
Cholesterol: 154 mg/dL (ref <200)
HDL: 54 mg/dL (ref >=50)
LDL Cholesterol (Calc): 85 mg/dL
Non-HDL Cholesterol (Calc): 100 mg/dL (ref <130)
Total CHOL/HDL Ratio: 2.9 (calc) (ref <5.0)
Triglycerides: 63 mg/dL (ref <150)

## 2024-04-13 LAB — COMPREHENSIVE METABOLIC PANEL WITH GFR
AG Ratio: 1.2 (calc) (ref 1.0–2.5)
ALT: 8 U/L (ref 6–29)
AST: 21 U/L (ref 10–35)
Albumin: 3.3 g/dL — ABNORMAL LOW (ref 3.6–5.1)
Alkaline phosphatase (APISO): 94 U/L (ref 37–153)
BUN: 9 mg/dL (ref 7–25)
CO2: 29 mmol/L (ref 20–32)
Calcium: 8.7 mg/dL (ref 8.6–10.4)
Chloride: 107 mmol/L (ref 98–110)
Creat: 0.73 mg/dL (ref 0.60–1.00)
Globulin: 2.7 g/dL (ref 1.9–3.7)
Glucose, Bld: 99 mg/dL (ref 65–99)
Potassium: 3.6 mmol/L (ref 3.5–5.3)
Sodium: 142 mmol/L (ref 135–146)
Total Bilirubin: 1.5 mg/dL — ABNORMAL HIGH (ref 0.2–1.2)
Total Protein: 6 g/dL — ABNORMAL LOW (ref 6.1–8.1)
eGFR: 86 mL/min/1.73m2 (ref >=60)

## 2024-04-13 LAB — VITAMIN B12: Vitamin B-12: 534 pg/mL (ref 200–1100)

## 2024-04-13 LAB — HEMOGLOBIN A1C
Hgb A1c MFr Bld: 4.6 % (ref <5.7)
Mean Plasma Glucose: 85 mg/dL
eAG (mmol/L): 4.7 mmol/L

## 2024-04-13 LAB — TSH: TSH: 9.28 m[IU]/L — ABNORMAL HIGH (ref 0.40–4.50)

## 2024-04-15 ENCOUNTER — Ambulatory Visit (HOSPITAL_COMMUNITY)
Admission: RE | Admit: 2024-04-15 | Discharge: 2024-04-15 | Disposition: A | Discharge status: Home / Self Care | Source: Ambulatory Visit | Attending: Family Medicine | Admitting: Family Medicine

## 2024-04-15 DIAGNOSIS — Z952 Presence of prosthetic heart valve: Secondary | ICD-10-CM | POA: Insufficient documentation

## 2024-04-15 DIAGNOSIS — I350 Nonrheumatic aortic (valve) stenosis: Secondary | ICD-10-CM | POA: Diagnosis present

## 2024-04-15 DIAGNOSIS — I081 Rheumatic disorders of both mitral and tricuspid valves: Secondary | ICD-10-CM | POA: Insufficient documentation

## 2024-04-15 DIAGNOSIS — I77810 Thoracic aortic ectasia: Secondary | ICD-10-CM | POA: Insufficient documentation

## 2024-04-15 LAB — ECHOCARDIOGRAM COMPLETE
AR max vel: 1.56 cm2
AV Area VTI: 1.51 cm2
AV Area mean vel: 1.7 cm2
AV Mean grad: 20 mmHg
AV Peak grad: 35.1 mmHg
Ao pk vel: 2.96 m/s
Area-P 1/2: 2.33 cm2
Calc EF: 62.9 %
P 1/2 time: 500 ms
S' Lateral: 3.5 cm
Single Plane A2C EF: 57.5 %
Single Plane A4C EF: 63.9 %

## 2024-05-21 DIAGNOSIS — Z789 Other specified health status: Secondary | ICD-10-CM | POA: Insufficient documentation

## 2024-06-03 ENCOUNTER — Emergency Department (HOSPITAL_COMMUNITY)

## 2024-06-03 ENCOUNTER — Ambulatory Visit: Payer: Self-pay | Admitting: *Deleted

## 2024-06-03 ENCOUNTER — Other Ambulatory Visit: Payer: Self-pay

## 2024-06-03 ENCOUNTER — Emergency Department (HOSPITAL_COMMUNITY)
Admission: EM | Admit: 2024-06-03 | Discharge: 2024-06-03 | Disposition: A | Discharge status: Home / Self Care | Attending: Emergency Medicine | Admitting: Emergency Medicine

## 2024-06-03 ENCOUNTER — Encounter (HOSPITAL_COMMUNITY): Payer: Self-pay

## 2024-06-03 ENCOUNTER — Telehealth: Payer: Self-pay

## 2024-06-03 DIAGNOSIS — R197 Diarrhea, unspecified: Secondary | ICD-10-CM

## 2024-06-03 DIAGNOSIS — E119 Type 2 diabetes mellitus without complications: Secondary | ICD-10-CM | POA: Diagnosis not present

## 2024-06-03 DIAGNOSIS — Z79899 Other long term (current) drug therapy: Secondary | ICD-10-CM | POA: Diagnosis not present

## 2024-06-03 DIAGNOSIS — I10 Essential (primary) hypertension: Secondary | ICD-10-CM | POA: Insufficient documentation

## 2024-06-03 DIAGNOSIS — E039 Hypothyroidism, unspecified: Secondary | ICD-10-CM | POA: Insufficient documentation

## 2024-06-03 DIAGNOSIS — K8020 Calculus of gallbladder without cholecystitis without obstruction: Secondary | ICD-10-CM | POA: Diagnosis not present

## 2024-06-03 DIAGNOSIS — N281 Cyst of kidney, acquired: Secondary | ICD-10-CM | POA: Diagnosis not present

## 2024-06-03 DIAGNOSIS — R161 Splenomegaly, not elsewhere classified: Secondary | ICD-10-CM | POA: Diagnosis not present

## 2024-06-03 DIAGNOSIS — F0390 Unspecified dementia without behavioral disturbance: Secondary | ICD-10-CM | POA: Insufficient documentation

## 2024-06-03 DIAGNOSIS — K529 Noninfective gastroenteritis and colitis, unspecified: Secondary | ICD-10-CM | POA: Insufficient documentation

## 2024-06-03 DIAGNOSIS — K7689 Other specified diseases of liver: Secondary | ICD-10-CM | POA: Diagnosis not present

## 2024-06-03 LAB — HEPATIC FUNCTION PANEL
ALT: 15 U/L (ref 0–44)
AST: 32 U/L (ref 15–41)
Albumin: 3.2 g/dL — ABNORMAL LOW (ref 3.5–5.0)
Alkaline Phosphatase: 72 U/L (ref 38–126)
Bilirubin, Direct: 0.4 mg/dL — ABNORMAL HIGH (ref 0.0–0.2)
Indirect Bilirubin: 1.5 mg/dL — ABNORMAL HIGH (ref 0.3–0.9)
Total Bilirubin: 1.9 mg/dL — ABNORMAL HIGH (ref 0.0–1.2)
Total Protein: 6.3 g/dL — ABNORMAL LOW (ref 6.5–8.1)

## 2024-06-03 LAB — CBC WITH DIFFERENTIAL/PLATELET
Abs Immature Granulocytes: 0.01 K/uL (ref 0.00–0.07)
Abs Immature Granulocytes: 0.05 K/uL (ref 0.00–0.07)
Basophils Absolute: 0 K/uL (ref 0.0–0.1)
Basophils Absolute: 0 K/uL (ref 0.0–0.1)
Basophils Relative: 1 %
Basophils Relative: 1 %
Eosinophils Absolute: 0.2 K/uL (ref 0.0–0.5)
Eosinophils Absolute: 0.2 K/uL (ref 0.0–0.5)
Eosinophils Relative: 5 %
Eosinophils Relative: 5 %
HCT: 31.3 % — ABNORMAL LOW (ref 36.0–46.0)
HCT: 31 % — ABNORMAL LOW (ref 36.0–46.0)
Hemoglobin: 10.9 g/dL — ABNORMAL LOW (ref 12.0–15.0)
Hemoglobin: 10.9 g/dL — ABNORMAL LOW (ref 12.0–15.0)
Immature Granulocytes: 0 %
Immature Granulocytes: 2 %
Lymphocytes Relative: 22 %
Lymphocytes Relative: 21 %
Lymphs Abs: 0.8 K/uL (ref 0.7–4.0)
Lymphs Abs: 0.7 K/uL (ref 0.7–4.0)
MCH: 29.5 pg (ref 26.0–34.0)
MCH: 29.7 pg (ref 26.0–34.0)
MCHC: 34.8 g/dL (ref 30.0–36.0)
MCHC: 35.2 g/dL (ref 30.0–36.0)
MCV: 84.6 fL (ref 80.0–100.0)
MCV: 84.5 fL (ref 80.0–100.0)
Monocytes Absolute: 0.3 K/uL (ref 0.1–1.0)
Monocytes Absolute: 0.2 K/uL (ref 0.1–1.0)
Monocytes Relative: 8 %
Monocytes Relative: 7 %
Neutro Abs: 2.4 K/uL (ref 1.7–7.7)
Neutro Abs: 2.2 K/uL (ref 1.7–7.7)
Neutrophils Relative %: 64 %
Neutrophils Relative %: 64 %
Platelets: 41 K/uL — ABNORMAL LOW (ref 150–400)
Platelets: 38 K/uL — ABNORMAL LOW (ref 150–400)
RBC: 3.7 MIL/uL — ABNORMAL LOW (ref 3.87–5.11)
RBC: 3.67 MIL/uL — ABNORMAL LOW (ref 3.87–5.11)
RDW: 15.6 % — ABNORMAL HIGH (ref 11.5–15.5)
RDW: 15.6 % — ABNORMAL HIGH (ref 11.5–15.5)
WBC: 3.7 K/uL — ABNORMAL LOW (ref 4.0–10.5)
WBC: 3.4 K/uL — ABNORMAL LOW (ref 4.0–10.5)
nRBC: 0 % (ref 0.0–0.2)
nRBC: 0 % (ref 0.0–0.2)

## 2024-06-03 LAB — BASIC METABOLIC PANEL WITH GFR
Anion gap: 8 (ref 5–15)
BUN: 6 mg/dL — ABNORMAL LOW (ref 8–23)
CO2: 22 mmol/L (ref 22–32)
Calcium: 8.6 mg/dL — ABNORMAL LOW (ref 8.9–10.3)
Chloride: 110 mmol/L (ref 98–111)
Creatinine, Ser: 1.13 mg/dL — ABNORMAL HIGH (ref 0.44–1.00)
GFR, Estimated: 51 mL/min — ABNORMAL LOW (ref >60)
Glucose, Bld: 107 mg/dL — ABNORMAL HIGH (ref 70–99)
Potassium: 3.2 mmol/L — ABNORMAL LOW (ref 3.5–5.1)
Sodium: 140 mmol/L (ref 135–145)

## 2024-06-03 LAB — I-STAT CHEM 8, ED
BUN: 6 mg/dL — ABNORMAL LOW (ref 8–23)
Calcium, Ion: 1.17 mmol/L (ref 1.15–1.40)
Chloride: 107 mmol/L (ref 98–111)
Creatinine, Ser: 1.1 mg/dL — ABNORMAL HIGH (ref 0.44–1.00)
Glucose, Bld: 100 mg/dL — ABNORMAL HIGH (ref 70–99)
HCT: 31 % — ABNORMAL LOW (ref 36.0–46.0)
Hemoglobin: 10.5 g/dL — ABNORMAL LOW (ref 12.0–15.0)
Potassium: 3.2 mmol/L — ABNORMAL LOW (ref 3.5–5.1)
Sodium: 144 mmol/L (ref 135–145)
TCO2: 22 mmol/L (ref 22–32)

## 2024-06-03 LAB — LIPASE, BLOOD: Lipase: 35 U/L (ref 11–51)

## 2024-06-03 LAB — POC OCCULT BLOOD, ED: Fecal Occult Bld: NEGATIVE

## 2024-06-03 MED ORDER — SODIUM CHLORIDE 0.9 % IV BOLUS: 1000.0000 mL | Freq: Once | INTRAVENOUS | Status: DC

## 2024-06-03 MED ORDER — AMOXICILLIN-POT CLAVULANATE 875-125 MG PO TABS: 1.0000 | ORAL_TABLET | Freq: Two times a day (BID) | ORAL | 0 refills | Status: DC

## 2024-06-03 MED ORDER — HYDRALAZINE HCL 20 MG/ML IJ SOLN
5.0000 mg | Freq: Once | INTRAMUSCULAR | Status: AC
  Administered 2024-06-03: 5 mg via INTRAVENOUS
  Filled 2024-06-03: qty 1

## 2024-06-03 MED ORDER — IOHEXOL 350 MG/ML SOLN
75.0000 mL | Freq: Once | INTRAVENOUS | Status: AC | PRN
  Administered 2024-06-03: 75 mL via INTRAVENOUS

## 2024-06-03 MED ORDER — AMOXICILLIN-POT CLAVULANATE 875-125 MG PO TABS
1.0000 | ORAL_TABLET | Freq: Once | ORAL | Status: AC
  Administered 2024-06-03: 1 via ORAL
  Filled 2024-06-03: qty 1

## 2024-06-03 MED ORDER — SODIUM CHLORIDE 0.9 % IV BOLUS
500.0000 mL | Freq: Once | INTRAVENOUS | Status: AC
  Administered 2024-06-03: 500 mL via INTRAVENOUS

## 2024-06-03 NOTE — Telephone Encounter (Signed)
 FYI Only or Action Required?: FYI only for provider: ED advised and patient does not want to go .  Patient was last seen in primary care on 04/12/2024 by Duanne Butler DASEN, MD.  Called Nurse Triage reporting Bleeding/Bruising.  Symptoms began today.  Interventions attempted: Nothing.  Symptoms are: rapidly worsening.  Triage Disposition: Go to ED Now (or PCP Triage)  Patient/caregiver understands and will follow disposition?: No, wishes to speak with PCP  Copied from CRM #[id number]. Topic: Clinical - Red Word Triage >> Jun 03, 2024  9:54 AM Mia F wrote: Red Word that prompted transfer to Nurse Triage: Pt woke up this morning with a spot of blood about 2 feet big. She went to the bathroom and there was blood in her underwear. During the night she bled terribly. Very tired. In the past she has had spotting but this time she it is more. Reason for Disposition  Patient sounds very sick or weak to the triager  Answer Assessment - Initial Assessment Questions Recommended ED . Unsure if bleeding from vagina or rectum. Patient requesting not to go to ED. Patient requesting if PCP recommended ED or if she can just be seen tomorrow at scheduled physical.  Recommended patient to go to ED now. Please advise.   CAL notified patient requesting PCP recommendation and would rather not go to ED, or if UC ok.     1. APPEARANCE of BLOOD: What color is it? Is it passed separately, on the surface of the stool, or mixed in with the stool?      Bright red blood this am , husband reports bleeding dark in color and clots noted  2. AMOUNT: How much blood was passed?      2 feet big in brief underware and some on bed. 3. FREQUENCY: How many times has blood been passed with the stools?      Upon awakening in bed. And blood 2 feet big 4. ONSET: When was the blood first seen in the stools? (Days or weeks)      3-4 days 5. DIARRHEA: Is there also some diarrhea? If Yes, ask: How many diarrhea  stools in the past 24 hours?      Yes  some soft stool but not diarrhea . 6. CONSTIPATION: Do you have constipation? If Yes, ask: How bad is it?     Not sure  7. RECURRENT SYMPTOMS: Have you had blood in your stools before? If Yes, ask: When was the last time? and What happened that time?      Yes but only with wiping  8. BLOOD THINNERS: Do you take any blood thinners? (e.g., aspirin , clopidogrel  / Plavix , coumadin, heparin ). Notes: Other strong blood thinners include: Arixtra (fondaparinux), Eliquis (apixaban), Pradaxa (dabigatran), and Xarelto (rivaroxaban).     Na  9. OTHER SYMPTOMS: Do you have any other symptoms?  (e.g., abdomen pain, vomiting, dizziness, fever)     Very tired feeling , dizziness at times with standing . Noted bleeding this am and not sure if from vaginal or rectal. Has had blood noted light pink from rectal 10. PREGNANCY: Is there any chance you are pregnant? When was your last menstrual period?       na  Protocols used: Rectal Bleeding-A-AH

## 2024-06-03 NOTE — Telephone Encounter (Signed)
 Spoke with Kelly [name]. Pt is in the ER being evaluated now. Mjp,lpn  Copied from CRM #[id number]. Topic: General - Other >> Jun 03, 2024  1:20 PM Delon DASEN wrote: Reason for CRM: husband Gerlene calling for Kelly [name] to give an update on patient- please call [phone]-  at ER now

## 2024-06-03 NOTE — Discharge Instructions (Addendum)
 CT was indicative of colitis which is inflammation of the colon.  This is most likely due to the diarrhea.  I prescribed an antibiotic to take to help with the colitis.  Please follow-up with your primary care at the established appointment tomorrow for further evaluation.  CT also noted enlarged spleen and portal hypertension which are consistent with current diagnosis.  Continue to monitor for any worsening symptoms if any concerning or worsening symptoms arise please return to ED for further evaluation.

## 2024-06-03 NOTE — ED Provider Notes (Signed)
 [place] EMERGENCY DEPARTMENT AT [facility] Provider Note   CSN: [id number] Arrival date & time: 06/03/24  1138     Patient presents with: Vaginal Bleeding   Kelly [name] is a 73 y.o. female.  73 year old female presents to the ED with complaints of 4 days of diarrhea. Patient has history of  cirrhosis secondary to nonalcoholic steatohepatitis. She has a history of upper GI bleeding secondary to AVMs as well as esophageal varices. She has a history of aortic valve replacement due to severe aortic valve stenosis. She has a history of type 2 diabetes, hypertension, hypothyroidism. Patient also has a history of dementia.    Patient reports she also has had some bleeding but she is not sure if it is rectal or vaginal bleeding.  She reports has noticed some bleeding while using the restroom but not sure if it is vaginal or rectal.  Patient reports there is not a significant amount of bright red blood and no dark-colored stools but does endorse some dark orange-colored substance on the toilet paper.  Patient reports that she woke up this morning with an area of blood and diarrhea on the sheets.  Patient reports she has not had any issues with holding her stools or urine prior to this.  Patient is not on any blood thinners.  Patient does not endorse any abdominal pain on initial exam.  Patient denies any chest pain, dizziness, shortness of breath.  Patient is on nadolol , levothyroxine , spironolactone .     Prior to Admission medications   Medication Sig Start Date End Date Taking? Authorizing Provider  amoxicillin -clavulanate (AUGMENTIN ) 875-125 MG tablet Take 1 tablet by mouth every 12 (twelve) hours. 06/03/24  Yes Myriam Fonda RAMAN, PA-C  ACCU-CHEK AVIVA PLUS test strip  08/12/16   [provider]  ACCU-CHEK SOFTCLIX LANCETS lancets  08/25/16   [provider]  atorvastatin  (LIPITOR) 20 MG tablet TAKE 1 TABLET EVERY DAY Patient not taking: Reported on 04/12/2024 08/21/21    Duanne Butler DASEN, MD  Blood Glucose Monitoring Suppl (ACCU-CHEK AVIVA PLUS) w/Device KIT  08/25/16   [provider]  cyanocobalamin  (VITAMIN B12) 1000 MCG tablet Take 1 tablet (1,000 mcg total) by mouth daily. Patient not taking: Reported on 04/12/2024 01/27/23   Duanne Butler DASEN, MD  FLUoxetine  (PROZAC ) 40 MG capsule Take 1 capsule (40 mg total) by mouth daily. Appointment needed w PCP. Patient not taking: Reported on 04/12/2024 11/19/23   Duanne Butler DASEN, MD  furosemide  (LASIX ) 20 MG tablet Take 20 mg by mouth daily. Patient not taking: Reported on 04/12/2024 12/26/22   [provider]  Iron , Ferrous Sulfate , 325 (65 Fe) MG TABS Take 325 mg by mouth daily. Patient not taking: Reported on 04/12/2024 01/27/23   Duanne Butler DASEN, MD  levothyroxine  (SYNTHROID ) 88 MCG tablet Take 1 tablet (88 mcg total) by mouth daily. 04/12/24   Duanne Butler DASEN, MD  losartan  (COZAAR ) 100 MG tablet TAKE 1 TABLET EVERY DAY Patient not taking: Reported on 04/12/2024 12/05/21   Duanne Butler DASEN, MD  nadolol  (CORGARD ) 20 MG tablet Take 1 tablet (20 mg total) by mouth daily. 04/12/24   Duanne Butler DASEN, MD  pantoprazole  (PROTONIX ) 40 MG tablet TAKE 1 TABLET EVERY DAY Patient not taking: Reported on 04/12/2024 06/12/21   Duanne Butler DASEN, MD  potassium chloride  (KLOR-CON  M) 10 MEQ tablet Take 10 mEq by mouth 2 (two) times daily. Patient not taking: Reported on 04/12/2024 12/26/22   [provider]  spironolactone  (ALDACTONE )  25 MG tablet Take 1 tablet (25 mg total) by mouth daily. 04/12/24   Duanne Butler DASEN, MD  traZODone  (DESYREL ) 50 MG tablet TAKE 1 TABLET BY MOUTH EVERY DAY AT BEDTIME AS NEEDED Patient not taking: Reported on 04/12/2024 03/25/24   Duanne Butler DASEN, MD    Allergies: Prednisone , Celecoxib, and Macrodantin [nitrofurantoin macrocrystal]    Review of Systems  Gastrointestinal:  Positive for blood in stool and diarrhea. Negative for rectal pain.  Genitourinary:  Negative for pelvic pain.   All other systems reviewed and are negative.   Updated Vital Signs BP (!) 173/72   Pulse (!) 53   Temp 97.7 F (36.5 C)   Resp 18   Ht 5' 4.5 (1.638 m)   SpO2 100%   BMI 28.90 kg/m   Physical Exam Vitals and nursing note reviewed. Exam conducted with a chaperone present.  Constitutional:      General: She is not in acute distress.    Appearance: Normal appearance. She is not ill-appearing.  HENT:     Head: Normocephalic and atraumatic.  Eyes:     Extraocular Movements: Extraocular movements intact.     Pupils: Pupils are equal, round, and reactive to light.  Abdominal:     General: Abdomen is flat. Bowel sounds are normal. There is no distension.     Palpations: Abdomen is soft. There is no shifting dullness.     Tenderness: There is abdominal tenderness in the left lower quadrant. There is no right CVA tenderness, left CVA tenderness, guarding or rebound. Negative signs include Murphy's sign and McBurney's sign.     Comments: Pain in the left lower quadrant on deep palpation.   Genitourinary:    Comments: Nurse present for rectal exam.  Patient has normal rectal tone with noted nonthrombosed hemorrhoids around the rectum.  No pain to the rectum on palpation.  Skin is obviously inflamed most likely secondary to diarrhea.  Patient also has some loose stool around the rectal vault.  Hemoccult was negative. Musculoskeletal:        General: Normal range of motion.  Skin:    General: Skin is warm.     Capillary Refill: Capillary refill takes less than 2 seconds.     Coloration: Skin is not jaundiced.  Neurological:     General: No focal deficit present.     Mental Status: She is alert. Mental status is at baseline.     (all labs ordered are listed, but only abnormal results are displayed) Labs Reviewed  CBC WITH DIFFERENTIAL/PLATELET - Abnormal; Notable for the following components:      Result Value   WBC 3.7 (*)    RBC 3.70 (*)    Hemoglobin 10.9 (*)    HCT 31.3 (*)     RDW 15.6 (*)    Platelets 41 (*)    All other components within normal limits  BASIC METABOLIC PANEL WITH GFR - Abnormal; Notable for the following components:   Potassium 3.2 (*)    Glucose, Bld 107 (*)    BUN 6 (*)    Creatinine, Ser 1.13 (*)    Calcium  8.6 (*)    GFR, Estimated 51 (*)    All other components within normal limits  HEPATIC FUNCTION PANEL - Abnormal; Notable for the following components:   Total Protein 6.3 (*)    Albumin 3.2 (*)    Total Bilirubin 1.9 (*)    Bilirubin, Direct 0.4 (*)    Indirect Bilirubin 1.5 (*)  All other components within normal limits  CBC WITH DIFFERENTIAL/PLATELET - Abnormal; Notable for the following components:   WBC 3.4 (*)    RBC 3.67 (*)    Hemoglobin 10.9 (*)    HCT 31.0 (*)    RDW 15.6 (*)    Platelets 38 (*)    All other components within normal limits  I-STAT CHEM 8, ED - Abnormal; Notable for the following components:   Potassium 3.2 (*)    BUN 6 (*)    Creatinine, Ser 1.10 (*)    Glucose, Bld 100 (*)    Hemoglobin 10.5 (*)    HCT 31.0 (*)    All other components within normal limits  LIPASE, BLOOD  URINALYSIS, ROUTINE W REFLEX MICROSCOPIC  POC OCCULT BLOOD, ED    EKG: EKG Interpretation Date/Time:  Thursday June 03 2024 16:24:49 EDT Ventricular Rate:  52 PR Interval:  173 QRS Duration:  185 QT Interval:  591 QTC Calculation: 550 R Axis:   79  Text Interpretation: Sinus rhythm LVH with secondary repolarization abnormality Prolonged QT interval LBBB unchanged Confirmed by Bari Flank [phone]) on 06/03/2024 6:35:55 PM  Radiology: CT ABDOMEN PELVIS W CONTRAST Result Date: 06/03/2024 EXAM: CT ABDOMEN AND PELVIS WITH CONTRAST 06/03/2024 05:46:00 PM TECHNIQUE: CT of the abdomen and pelvis was performed with the administration of 75 mL of iohexol  (OMNIPAQUE ) 350 MG/ML injection. Multiplanar reformatted images are provided for review. Automated exposure control, iterative reconstruction, and/or weight-based  adjustment of the mA/kV was utilized to reduce the radiation dose to as low as reasonably achievable. COMPARISON: CT abdomen and pelvis 12/20/2021. Chest CT 07/06/2020. CLINICAL HISTORY: LLQ abdominal pain. FINDINGS: LOWER CHEST: There is a 1 cm left lower lobe pulmonary nodule which is unchanged from 2021. LIVER: Nodular liver contour compatible with cirrhosis. GALLBLADDER AND BILE DUCTS: Small gallstones are present. No biliary ductal dilatation. SPLEEN: The spleen is severely enlarged measuring up to 20 cm, unchanged. PANCREAS: No acute abnormality. ADRENAL GLANDS: No acute abnormality. KIDNEYS, URETERS AND BLADDER: There is a subcentimeter cyst in the inferior pole of the left kidney. Left kidney is low lying. No stones in the kidneys or ureters. No hydronephrosis. No perinephric or periureteral stranding. The bladder is completely decompressed. Per consensus, no follow-up is needed for simple Bosniak type 1 and 2 renal cysts, unless the patient has a malignancy history or risk factors. GI AND BOWEL: Stomach demonstrates no acute abnormality. There is diffuse colonic diverticulosis. There is likely mild diffuse colonic wall thickening without surrounding inflammation. The appendix is not visualized. There is sigmoid colon diverticulosis. There is no bowel obstruction. PERITONEUM AND RETROPERITONEUM: No ascites. No free air. VASCULATURE: Aorta is normal in caliber. 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 There is recanalization of the umbilical vein. Pelvic varices are also seen near the rectum. LYMPH NODES: No lymphadenopathy. REPRODUCTIVE ORGANS: No acute abnormality. BONES AND SOFT TISSUES: Degenerative changes of the thoracic spine. There is a small fat-containing umbilical hernia. No acute osseous abnormality. No focal soft tissue abnormality. IMPRESSION: 1. Mild diffuse colonic wall thickening without surrounding inflammatory changes. Correlate clinically for colitis. 2. Nodular liver contour compatible with  cirrhosis with sequelae of portal hypertension, including paraesophageal varices, recanalized umbilical vein, and pelvic varices. 3. Severe splenomegaly measuring up to 20 cm, unchanged. 4. Stable 1 cm left lower lobe pulmonary nodule unchanged since 2021; no routine follow-up imaging recommended per Fleischner Society Guidelines. 5. Cholelithiasis. Electronically signed by: Greig Pique MD 06/03/2024 06:06 PM EDT RP Workstation: [id number]     Procedures  Medications Ordered in the ED  sodium chloride  0.9 % bolus 500 mL (0 mLs Intravenous Stopped 06/03/24 1853)  hydrALAZINE  (APRESOLINE ) injection 5 mg (5 mg Intravenous Given 06/03/24 1815)  iohexol  (OMNIPAQUE ) 350 MG/ML injection 75 mL (75 mLs Intravenous Contrast Given 06/03/24 1747)  amoxicillin -clavulanate (AUGMENTIN ) 875-125 MG per tablet 1 tablet (1 tablet Oral Given 06/03/24 1919)  hydrALAZINE  (APRESOLINE ) injection 5 mg (5 mg Intravenous Given 06/03/24 2007)   73 y.o. female presents to the ED with complaints of diarrhea for 4 days and bleeding unsure if it is rectal or vaginal., this involves an extensive number of treatment options, and is a complaint that carries with it a high risk of complications and morbidity.  The differential diagnosis includes upper GI bleed, lower GI bleed.  (Ddx)  On arrival pt is nontoxic, vitals significant for hypertension patient was recently placed on nadolol  for hypertension issues. Exam significant nonthrombosed hemorrhoids around the rectum with obvious erythematous skin with some loose stool noted around the rectal vault.  Additional history obtained from chart review significant for patient being placed on nadolol  for hypertension  I ordered medication hydralazine  and Augmentin  for colitis and hypertension  Lab Tests:  I Ordered, reviewed, and interpreted labs, which included: BMP, CBC, hepatic function panel, lipase, Hemoccult, UA.  Patient chronically has low white blood cells and platelets.   Hemoglobin is improved from her last CBC.  BMP noted some mildly elevated creatinine and mildly decreased GFR from her baseline.  Patient has had 4 days of diarrhea.  Dehydration is suspected due to this patient was given 500 mL bolus.  Bilirubin and protein are at baseline for patient patient does have history of portal hypertension.  Lipase was unremarkable and POC Hemoccult was unremarkable.  Imaging Studies ordered:  I ordered imaging studies which included CT abdomen pelvis, I independently visualized and interpreted imaging which showed evidence of colitis, splenomegaly, portal hypertension, pelvic varices, cholelithiasis.  Patient was advised of findings and advised her consistent with previous CT scans and current diagnosis.  Patient does not have any right upper quadrant pain.  ED Course:   Patient is nontoxic appearing and sitting comfortably in ED bed.  Patient does have history of hypertension and and is on nadolol  by PCP.  Blood pressure was elevated in ED several occasions and patient was started on hydralazine . Patient was given hydralazine  with blood pressure returning to appropriate levels.  Patient denies any headache or dizziness on exam and there is no signs of focal deficits on exam.  CT was significant for colitis and patient was started on Augmentin .  Patient was unable to give urine sample in ED.  On CBC repeat hemoglobin had increased to 10.9 from 10.5.  Patient reported she does not have any new symptoms and advise she was ready to go home.  Patient has an appointment with her primary care in the morning for recheck.  Patient had not had any reported bloody stool or bloody urine while in ED.  Patient was advised to follow-up with him for further evaluation patient agreed with treatment plan and was comfortable with discharge at this time.   Portions of this note were generated with Scientist, clinical (histocompatibility and immunogenetics). Dictation errors Sibert occur despite best attempts at proofreading.     Final diagnoses:  Diarrhea of presumed infectious origin  Colitis    ED Discharge Orders          Ordered    amoxicillin -clavulanate (AUGMENTIN ) 875-125 MG tablet  Every 12 hours  06/03/24 2121               Myriam Fonda RAMAN, PA-C 06/03/24 2252    Bari Roxie HERO, DO 06/03/24 2259

## 2024-06-03 NOTE — ED Provider Triage Note (Signed)
 Emergency Medicine Provider Triage Evaluation Note  Kelly [name] , a 73 y.o. female  was evaluated in triage.  Pt complains of rectal or vaginal bleeding x 4 days.  Patient reports she is not sure which one but notices it is worse at night and gets better during the day.  She sometimes has blood noted in the toilet but is not sure if it is rectal or vaginal.  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  Review of Systems  Positive: Rectal or vaginal bleeding, abdominal pain Negative: Shortness of breath, chest pain, syncope, constipation,  Physical Exam  BP (!) 172/59 (BP Location: Right Arm)   Pulse (!) 52   Temp 97.6 F (36.4 C)   Resp 17   Ht 5' 4.5 (1.638 m)   SpO2 98%   BMI 28.90 kg/m  Gen:   Awake, no distress   Resp:  Normal effort  MSK:   Moves extremities without difficulty  Other:  No pain to palpation throughout abdomen  Medical Decision Making  Medically screening exam initiated at 12:09 PM.  Appropriate orders placed.  Rakhi Romagnoli Kohut was informed that the remainder of the evaluation will be completed by another provider, this initial triage assessment does not replace that evaluation, and the importance of remaining in the ED until their evaluation is complete.     Myriam Fonda RAMAN, [place] 06/03/24 1226

## 2024-06-03 NOTE — ED Notes (Signed)
 Previous urine samples collected had stool in them and therefore could not be processed. Pt notified that we need a new urine sample whenever she is able to provide one. Pt given to cups of water at bedside to drink.

## 2024-06-03 NOTE — Telephone Encounter (Signed)
 Tried to return Mr. Swilling call. LM on Mr. Franzen cell phone. Tried home number again and line is still busy. Mjp,lpn  Copied from CRM #[id number]. Topic: General - Call Back - No Documentation >> Jun 03, 2024 12:10 PM Charlet HERO wrote: Reason for CRM: Patients husband is calling in to speak with Ronal Bradley per her request to update status of his wife.

## 2024-06-03 NOTE — ED Triage Notes (Signed)
 QUICK TRIAGE: Upon waking this AM pt noted a large puddle of blood and clots in the bed.  Pt states she put in a tampon at that time and is unsure if she is still bleeding.  Husband is with pt, states she has mild dementia.

## 2024-06-03 NOTE — ED Triage Notes (Signed)
 Patient reports bleeding x 4 days and is unsure whether it is rectal or vaginal but states it was watery or light and denies passing clots. Patient states it started as spotting and has gotten worse and woke up this morning with blood in the bed. Patient also states it seems less when she is up moving around, denies abdominal pain, GI bleeds, and hx of vaginal bleeding.

## 2024-06-04 ENCOUNTER — Encounter: Payer: Self-pay | Admitting: Family Medicine

## 2024-06-04 ENCOUNTER — Ambulatory Visit: Admitting: Family Medicine

## 2024-06-04 VITALS — BP 116/68 | HR 59 | Temp 98.2°F | Ht 64.5 in | Wt 149.0 lb

## 2024-06-04 DIAGNOSIS — D696 Thrombocytopenia, unspecified: Secondary | ICD-10-CM | POA: Diagnosis not present

## 2024-06-04 DIAGNOSIS — K7469 Other cirrhosis of liver: Secondary | ICD-10-CM | POA: Diagnosis not present

## 2024-06-04 DIAGNOSIS — F03B0 Unspecified dementia, moderate, without behavioral disturbance, psychotic disturbance, mood disturbance, and anxiety: Secondary | ICD-10-CM

## 2024-06-04 DIAGNOSIS — R410 Disorientation, unspecified: Secondary | ICD-10-CM

## 2024-06-04 DIAGNOSIS — K625 Hemorrhage of anus and rectum: Secondary | ICD-10-CM

## 2024-06-04 DIAGNOSIS — K7581 Nonalcoholic steatohepatitis (NASH): Secondary | ICD-10-CM | POA: Diagnosis not present

## 2024-06-04 NOTE — Progress Notes (Signed)
 04/12/24 Patient is here today for follow-up.  She has a history of cirrhosis secondary to nonalcoholic steatohepatitis.  She has a history of upper GI bleeding secondary to AVMs as well as esophageal varices.  She has a history of aortic valve replacement due to severe aortic valve stenosis.  She has a history of type 2 diabetes, hypertension, hypothyroidism.  Patient also has a history of dementia.  At her last OV, my plan was: Patient appears euvolemic today.  She does not appear fluid overloaded.  I will resume spironolactone  25 mg daily due to her history of cirrhosis.  I will also resume nadolol  20 mg a day given her history of esophageal varices and because of her elevated blood pressure.  I plan to recheck her blood pressure in 2 to 3 weeks after she resumes these medications.  Obtain a baseline CMP today.  Recheck potassium and renal function in 2 weeks.  Given her history of bleeding AVMs, I will also check a CBC today.  Also check TSH and vitamin B12 given her severe fatigue.  I suspect this is likely due to her stopping levothyroxine .  Resume levothyroxine  88 mcg daily and check a baseline TSH today.  Schedule echocardiogram of the heart.  I will hold off adding additional medication until I see her back in 2 to 3 weeks.  Monitor her hemoglobin A1c to assess the control of her blood sugars  06/04/24 Labs were significant for abnormal TSH, Hgb of 10, and platelet count of 41.  Recommended she resume levothyroxine  88 mcg poqday in addition to nadolol .  Obtained echo which revealed ef of 65% and no evidence of heart failure. Went to ER yesterday.  Reviewed labs and CT.  Diagnosed with colitis and was given Augmentin .  However, today she states that she in only on the nadolol  and augmentin .  Not taking any other medication.  Wt Readings from Last 3 Encounters:  06/04/24 149 lb (67.6 kg)  04/12/24 171 lb (77.6 kg)  01/16/23 145 lb 9.6 oz (66 kg)  Patient was guaiac negative in the hospital.  She went  to the emergency room because she woke up and her sheets were stained with blood.  She was uncertain whether this was rectal bleeding or vaginal bleeding.  Patient's history is uncertain due to dementia.  She is very confused.  Her husband states that she has been reporting blood when she goes to the bathroom for quite some time.  However he is uncertain of whether it is coming from her stool or from her vagina.  I performed a speculum exam today.  With the patient in the lithotomy position, there was bright red blood around her rectum and on her gluteus.  There was no blood in the vagina.  I saw no blood in the posterior fornix.  Also saw no blood coming from the cervical os.  Therefore I believe it is clearly coming from her rectum. Patient was originally scheduled today for a physical exam however I am concerned by her level of confusion.  She never started back on the thyroid  medication.  She is still taking the nadolol  however she is clearly suffering from bright red blood per rectum.  She is currently on Augmentin  for colitis but she denies any abdominal pain.  Hemoglobin in the hospital yesterday was stable compared to her last office visit however she did appear to be dehydrated on her blood work.  She denies any heavy bleeding today.  She has seen a small  amount of blood on her underwear since leaving the hospital last night.    Past Medical History:  Diagnosis Date   Anxiety    Chronic upper GI bleeding    intestinal AVM's, portal gastropathy, esophageal varices on egd 04/2018   Coronary artery disease involving native coronary artery without angina pectoris    Dementia (HCC) 09/16/2016   Depression    DIVERTICULITIS OF COLON 06/14/2008   Qualifier: Diagnosis of  By: Tivis RN, Channing     DM (diabetes mellitus), type 2 (HCC) 09/15/2016   Esophageal varices determined by endoscopy (HCC)    Hyperlipidemia    Hypertension    Hypothyroidism    Incidental pulmonary nodule, greater than or equal  to 8mm 10/14/2018   LLL nodule needs f/u CT or PET-CT scan   Irritable bowel syndrome 04/06/2008   Centricity Description: IBS Qualifier: Diagnosis of  By: Ezzard CMA (NANNIE), [name]   Centricity Description: IRRITABLE BOWEL SYNDROME Qualifier: Diagnosis of  By: Teressa MD, Toribio SQUIBB    Liver cirrhosis secondary to NASH (HCC) 09/16/2016   NASH (nonalcoholic steatohepatitis)    Obesity    Portal hypertensive gastropathy (HCC)    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 04/20/19: s/p TAVR wtih a 23mm Medtronic CoreValve Evolut Pro via the TF approach   Severe aortic stenosis    Splenomegaly 09/16/2016   Thrombocytopenia    Thyroid  disease    Tobacco abuse    Vitamin D deficiency    Past Surgical History:  Procedure Laterality Date   BIOPSY  04/04/2022   Procedure: BIOPSY;  Surgeon: Wilhelmenia Aloha Raddle., MD;  Location: [facility];  Service: Gastroenterology;;   CARDIAC CATHETERIZATION     COLONOSCOPY WITH PROPOFOL  N/A 04/09/2018   Procedure: COLONOSCOPY WITH PROPOFOL ;  Surgeon: Teressa Toribio SQUIBB, MD;  Location: [facility];  Service: Endoscopy;  Laterality: N/A;   COLONOSCOPY WITH PROPOFOL  N/A 04/04/2022   Procedure: COLONOSCOPY WITH PROPOFOL ;  Surgeon: Mansouraty, Aloha Raddle., MD;  Location: [facility];  Service: Gastroenterology;  Laterality: N/A;   CORONARY ANGIOPLASTY     CORONARY STENT INTERVENTION N/A 02/11/2019   Procedure: CORONARY STENT INTERVENTION;  Surgeon: Verlin Lonni BIRCH, MD;  Location: [facility];  Service: Cardiovascular;  Laterality: N/A;  Prox RCA Prox LAD   ESOPHAGOGASTRODUODENOSCOPY (EGD) WITH PROPOFOL  N/A 04/09/2018   Procedure: ESOPHAGOGASTRODUODENOSCOPY (EGD) WITH PROPOFOL ;  Surgeon: Teressa Toribio SQUIBB, MD;  Location: [facility];  Service: Endoscopy;  Laterality: N/A;   ESOPHAGOGASTRODUODENOSCOPY (EGD) WITH PROPOFOL  N/A 04/04/2022   Procedure: ESOPHAGOGASTRODUODENOSCOPY (EGD) WITH PROPOFOL ;  Surgeon: Wilhelmenia Aloha Raddle., MD;   Location: [facility];  Service: Gastroenterology;  Laterality: N/A;   oopherectomy     OVARIAN CYST REMOVAL     POLYPECTOMY  04/09/2018   Procedure: POLYPECTOMY;  Surgeon: Teressa Toribio SQUIBB, MD;  Location: [facility];  Service: Endoscopy;;   POLYPECTOMY  04/04/2022   Procedure: POLYPECTOMY;  Surgeon: Wilhelmenia Aloha Raddle., MD;  Location: [facility];  Service: Gastroenterology;;   RIGHT/LEFT HEART CATH AND CORONARY ANGIOGRAPHY N/A 10/07/2018   Procedure: RIGHT/LEFT HEART CATH AND CORONARY ANGIOGRAPHY;  Surgeon: Verlin Lonni BIRCH, MD;  Location: [facility];  Service: Cardiovascular;  Laterality: N/A;   TEE WITHOUT CARDIOVERSION N/A 04/20/2019   Procedure: TRANSESOPHAGEAL ECHOCARDIOGRAM (TEE);  Surgeon: Verlin Lonni BIRCH, MD;  Location: [facility];  Service: Open Heart Surgery;  Laterality: N/A;   TRANSCATHETER AORTIC VALVE REPLACEMENT, TRANSFEMORAL N/A 04/20/2019   Procedure: TRANSCATHETER AORTIC VALVE REPLACEMENT, TRANSFEMORAL approach using 23mm Evolut  PRO+ medtronic valve;  Surgeon: Verlin Lonni BIRCH, MD;  Location: [facility];  Service: Open Heart Surgery;  Laterality: N/A;   Current Outpatient Medications on File Prior to Visit  Medication Sig Dispense Refill   amoxicillin -clavulanate (AUGMENTIN ) 875-125 MG tablet Take 1 tablet by mouth every 12 (twelve) hours. 14 tablet 0   nadolol  (CORGARD ) 20 MG tablet Take 1 tablet (20 mg total) by mouth daily. 90 tablet 3   ACCU-CHEK AVIVA PLUS test strip  (Patient not taking: Reported on 04/12/2024)     ACCU-CHEK SOFTCLIX LANCETS lancets  (Patient not taking: Reported on 04/12/2024)     atorvastatin  (LIPITOR) 20 MG tablet TAKE 1 TABLET EVERY DAY (Patient not taking: Reported on 04/12/2024) 90 tablet 3   Blood Glucose Monitoring Suppl (ACCU-CHEK AVIVA PLUS) w/Device KIT  (Patient not taking: Reported on 04/12/2024)     cyanocobalamin  (VITAMIN B12) 1000 MCG tablet Take 1 tablet (1,000 mcg total) by mouth daily. (Patient not taking: Reported on  04/12/2024) 30 tablet 3   FLUoxetine  (PROZAC ) 40 MG capsule Take 1 capsule (40 mg total) by mouth daily. Appointment needed w PCP. (Patient not taking: Reported on 04/12/2024) 90 capsule 0   furosemide  (LASIX ) 20 MG tablet Take 20 mg by mouth daily. (Patient not taking: Reported on 04/12/2024)     Iron , Ferrous Sulfate , 325 (65 Fe) MG TABS Take 325 mg by mouth daily. (Patient not taking: Reported on 04/12/2024) 30 tablet 3   levothyroxine  (SYNTHROID ) 88 MCG tablet Take 1 tablet (88 mcg total) by mouth daily. (Patient not taking: Reported on 06/04/2024) 90 tablet 3   losartan  (COZAAR ) 100 MG tablet TAKE 1 TABLET EVERY DAY (Patient not taking: Reported on 04/12/2024) 90 tablet 1   pantoprazole  (PROTONIX ) 40 MG tablet TAKE 1 TABLET EVERY DAY (Patient not taking: Reported on 04/12/2024) 90 tablet 3   potassium chloride  (KLOR-CON  M) 10 MEQ tablet Take 10 mEq by mouth 2 (two) times daily. (Patient not taking: Reported on 04/12/2024)     spironolactone  (ALDACTONE ) 25 MG tablet Take 1 tablet (25 mg total) by mouth daily. 90 tablet 3   traZODone  (DESYREL ) 50 MG tablet TAKE 1 TABLET BY MOUTH EVERY DAY AT BEDTIME AS NEEDED (Patient not taking: Reported on 04/12/2024) 90 tablet 2   No current facility-administered medications on file prior to visit.    Allergies  Allergen Reactions   Prednisone  Itching    Heart rate increased   Celecoxib Rash   Macrodantin [Nitrofurantoin Macrocrystal] Rash   Social History   Socioeconomic History   Marital status: Married    Spouse name: Not on file   Number of children: 1   Years of education: Not on file   Highest education level: Not on file  Occupational History   Not on file  Tobacco Use   Smoking status: Every Day    Current packs/day: 0.50    Types: Cigarettes   Smokeless tobacco: Never  Vaping Use   Vaping status: Never Used  Substance and Sexual Activity   Alcohol use: No   Drug use: Not Currently    Types: Benzodiazepines   Sexual activity: Not Currently   Other Topics Concern   Not on file  Social History Narrative   Married x 53 years in 2023.   1 son   2 grandsons   Social Drivers of Corporate Investment Banker Strain: Low Risk  (07/24/2023)   Overall Financial Resource Strain (CARDIA)    Difficulty of Paying Living Expenses: Not very hard  Food Insecurity: No Food Insecurity (07/24/2023)   Hunger Vital Sign    Worried About Running Out of Food in the Last Year: Never true    Ran Out of Food in the Last Year: Never true  Transportation Needs: No Transportation Needs (07/24/2023)   PRAPARE - Administrator, Civil Service (Medical): No    Lack of Transportation (Non-Medical): No  Physical Activity: Inactive (07/24/2023)   Exercise Vital Sign    Days of Exercise per Week: 0 days    Minutes of Exercise per Session: 0 min  Stress: Stress Concern Present (07/24/2023)   [employer] of Occupational Health - Occupational Stress Questionnaire    Feeling of Stress : To some extent  Social Connections: Socially Isolated (07/24/2023)   Social Connection and Isolation Panel    Frequency of Communication with Friends and Family: Never    Frequency of Social Gatherings with Friends and Family: Never    Attends Religious Services: Never    Database Administrator or Organizations: No    Attends Banker Meetings: Never    Marital Status: Married  Catering Manager Violence: Not At Risk (07/24/2023)   Humiliation, Afraid, Rape, and Kick questionnaire    Fear of Current or Ex-Partner: No    Emotionally Abused: No    Physically Abused: No    Sexually Abused: No       Review of Systems     Objective:   Physical Exam Exam conducted with a chaperone present.  Constitutional:      Appearance: Normal appearance. She is obese.  Cardiovascular:     Rate and Rhythm: Normal rate and regular rhythm.     Heart sounds: Murmur heard.  Pulmonary:     Effort: Pulmonary effort is normal. No respiratory distress.      Breath sounds: Normal breath sounds. No wheezing, rhonchi or rales.  Abdominal:     General: Bowel sounds are normal. There is no distension.     Palpations: Abdomen is soft. There is no mass.     Tenderness: There is no guarding or rebound.  Genitourinary:    General: Normal vulva.     Exam position: Lithotomy position.     Vagina: Normal. No erythema or bleeding.     Cervix: Normal. No cervical motion tenderness, erythema or cervical bleeding.     Rectum: Guaiac result positive. External hemorrhoid present.   Musculoskeletal:     Right lower leg: No edema.     Left lower leg: No edema.  Neurological:     Mental Status: She is alert.          Assessment & Plan:  BRBPR (bright red blood per rectum) - Plan: Ambulatory referral to Gastroenterology, CBC with Differential/Platelet, Comprehensive metabolic panel with GFR, TSH  Confusion - Plan: CBC with Differential/Platelet, Comprehensive metabolic panel with GFR, TSH  Moderate dementia without behavioral disturbance, psychotic disturbance, mood disturbance, or anxiety, unspecified dementia type (HCC) - Plan: CBC with Differential/Platelet, Comprehensive metabolic panel with GFR, TSH  Liver cirrhosis secondary to NASH (HCC) - Plan: Ambulatory referral to Gastroenterology, CBC with Differential/Platelet, Comprehensive metabolic panel with GFR, TSH  Thrombocytopenia - Plan: CBC with Differential/Platelet, Comprehensive metabolic panel with GFR, TSH Patient has 3 main problems right now.  First is bright red blood per rectum.  I believes today's exam confirms that this is coming from a GI source.  She would be at high risk for bleeding hemorrhoids due to portal hypertension, given the quantity of  blood that she reports in the bed diverticulosis is certainly a possibility.  She is also losing a fair amount of weight so malignancy is also on the differential diagnosis.  I believe that she needs to see GI soon as possible for colonoscopy to  determine the source of the bleeding and to see if there is anything we can do to stop it.  Second is her cirrhosis.  I believe the cirrhosis is causing her thrombocytopenia and portal hypertension.  Patient is not a transplant candidate due to her severe dementia.  There is no evidence of fluid overload.  There is no evidence of hepatic encephalopathy at the present time.  She is already on Augmentin  to cover colitis.  Third issue is worsening dementia.  I believe the patient's memory is continuing to deteriorate.  She is very uncertain of the medication she is taking.  Her history is also unclear and therefore I believe that she is having a difficult time with short-term memory.  The patient likely needs more supervision that she is receiving at home from her husband.  Plan to discuss the situation with her son who is her power of attorney at church over the weekend.  Check CBC and CMP to determine if she is losing large quantities of blood

## 2024-06-05 LAB — CBC WITH DIFFERENTIAL/PLATELET
Absolute Lymphocytes: 819 {cells}/uL — ABNORMAL LOW (ref 850–3900)
Absolute Monocytes: 331 {cells}/uL (ref 200–950)
Basophils Absolute: 41 {cells}/uL (ref 0–200)
Basophils Relative: 0.9 %
Eosinophils Absolute: 239 {cells}/uL (ref 15–500)
Eosinophils Relative: 5.2 %
HCT: 31.9 % — ABNORMAL LOW (ref 35.0–45.0)
Hemoglobin: 11.2 g/dL — ABNORMAL LOW (ref 11.7–15.5)
MCH: 29.9 pg (ref 27.0–33.0)
MCHC: 35.1 g/dL (ref 32.0–36.0)
MCV: 85.3 fL (ref 80.0–100.0)
MPV: 11.1 fL (ref 7.5–12.5)
Monocytes Relative: 7.2 %
Neutro Abs: 3169 {cells}/uL (ref 1500–7800)
Neutrophils Relative %: 68.9 %
Platelets: 41 Thousand/uL — ABNORMAL LOW (ref 140–400)
RBC: 3.74 Million/uL — ABNORMAL LOW (ref 3.80–5.10)
RDW: 14.9 % (ref 11.0–15.0)
Total Lymphocyte: 17.8 %
WBC: 4.6 Thousand/uL (ref 3.8–10.8)

## 2024-06-05 LAB — COMPREHENSIVE METABOLIC PANEL WITH GFR
AG Ratio: 1.3 (calc) (ref 1.0–2.5)
ALT: 14 U/L (ref 6–29)
AST: 26 U/L (ref 10–35)
Albumin: 3.6 g/dL (ref 3.6–5.1)
Alkaline phosphatase (APISO): 76 U/L (ref 37–153)
BUN: 8 mg/dL (ref 7–25)
CO2: 24 mmol/L (ref 20–32)
Calcium: 8.6 mg/dL (ref 8.6–10.4)
Chloride: 107 mmol/L (ref 98–110)
Creat: 0.88 mg/dL (ref 0.60–1.00)
Globulin: 2.7 g/dL (ref 1.9–3.7)
Glucose, Bld: 91 mg/dL (ref 65–99)
Potassium: 3.1 mmol/L — ABNORMAL LOW (ref 3.5–5.3)
Sodium: 140 mmol/L (ref 135–146)
Total Bilirubin: 1.9 mg/dL — ABNORMAL HIGH (ref 0.2–1.2)
Total Protein: 6.3 g/dL (ref 6.1–8.1)
eGFR: 68 mL/min/1.73m2 (ref >=60)

## 2024-06-05 LAB — TSH: TSH: 2.5 m[IU]/L (ref 0.40–4.50)

## 2024-06-07 ENCOUNTER — Ambulatory Visit: Payer: Self-pay | Admitting: Family Medicine

## 2024-06-23 ENCOUNTER — Encounter: Payer: Self-pay | Admitting: Gastroenterology

## 2024-06-23 ENCOUNTER — Ambulatory Visit: Admitting: Gastroenterology

## 2024-06-23 ENCOUNTER — Other Ambulatory Visit (INDEPENDENT_AMBULATORY_CARE_PROVIDER_SITE_OTHER)

## 2024-06-23 ENCOUNTER — Telehealth: Payer: Self-pay

## 2024-06-23 VITALS — BP 136/84 | HR 44 | Ht 64.0 in | Wt 152.0 lb

## 2024-06-23 DIAGNOSIS — K3189 Other diseases of stomach and duodenum: Secondary | ICD-10-CM

## 2024-06-23 DIAGNOSIS — F0390 Unspecified dementia without behavioral disturbance: Secondary | ICD-10-CM | POA: Diagnosis not present

## 2024-06-23 DIAGNOSIS — K766 Portal hypertension: Secondary | ICD-10-CM | POA: Diagnosis not present

## 2024-06-23 DIAGNOSIS — R634 Abnormal weight loss: Secondary | ICD-10-CM

## 2024-06-23 DIAGNOSIS — K625 Hemorrhage of anus and rectum: Secondary | ICD-10-CM

## 2024-06-23 DIAGNOSIS — K7581 Nonalcoholic steatohepatitis (NASH): Secondary | ICD-10-CM

## 2024-06-23 DIAGNOSIS — R630 Anorexia: Secondary | ICD-10-CM | POA: Diagnosis not present

## 2024-06-23 DIAGNOSIS — D696 Thrombocytopenia, unspecified: Secondary | ICD-10-CM

## 2024-06-23 DIAGNOSIS — K7469 Other cirrhosis of liver: Secondary | ICD-10-CM

## 2024-06-23 DIAGNOSIS — I8500 Esophageal varices without bleeding: Secondary | ICD-10-CM

## 2024-06-23 DIAGNOSIS — Z87891 Personal history of nicotine dependence: Secondary | ICD-10-CM | POA: Diagnosis not present

## 2024-06-23 LAB — CBC WITH DIFFERENTIAL/PLATELET
Basophils Absolute: 0 K/uL (ref 0.0–0.1)
Basophils Relative: 0.9 % (ref 0.0–3.0)
Eosinophils Absolute: 0.4 K/uL (ref 0.0–0.7)
Eosinophils Relative: 7.7 % — ABNORMAL HIGH (ref 0.0–5.0)
HCT: 31.2 % — ABNORMAL LOW (ref 36.0–46.0)
Hemoglobin: 10.9 g/dL — ABNORMAL LOW (ref 12.0–15.0)
Lymphocytes Relative: 22.8 % (ref 12.0–46.0)
Lymphs Abs: 1.1 K/uL (ref 0.7–4.0)
MCHC: 34.9 g/dL (ref 30.0–36.0)
MCV: 86.1 fl (ref 78.0–100.0)
Monocytes Absolute: 0.3 K/uL (ref 0.1–1.0)
Monocytes Relative: 6.2 % (ref 3.0–12.0)
Neutro Abs: 3 K/uL (ref 1.4–7.7)
Neutrophils Relative %: 62.4 % (ref 43.0–77.0)
Platelets: 49 K/uL — CL (ref 150.0–400.0)
RBC: 3.62 Mil/uL — ABNORMAL LOW (ref 3.87–5.11)
RDW: 16.9 % — ABNORMAL HIGH (ref 11.5–15.5)
WBC: 4.8 K/uL (ref 4.0–10.5)

## 2024-06-23 LAB — COMPREHENSIVE METABOLIC PANEL WITH GFR
ALT: 16 U/L (ref 0–35)
AST: 33 U/L (ref 0–37)
Albumin: 3.5 g/dL (ref 3.5–5.2)
Alkaline Phosphatase: 81 U/L (ref 39–117)
BUN: 8 mg/dL (ref 6–23)
CO2: 25 meq/L (ref 19–32)
Calcium: 9 mg/dL (ref 8.4–10.5)
Chloride: 109 meq/L (ref 96–112)
Creatinine, Ser: 0.76 mg/dL (ref 0.40–1.20)
GFR: 76.5 mL/min (ref >60.00)
Glucose, Bld: 101 mg/dL — ABNORMAL HIGH (ref 70–99)
Potassium: 4 meq/L (ref 3.5–5.1)
Sodium: 141 meq/L (ref 135–145)
Total Bilirubin: 2.2 mg/dL — ABNORMAL HIGH (ref 0.2–1.2)
Total Protein: 6.5 g/dL (ref 6.0–8.3)

## 2024-06-23 LAB — PROTIME-INR
INR: 1.4 ratio — ABNORMAL HIGH (ref 0.8–1.0)
Prothrombin Time: 14.3 s — ABNORMAL HIGH (ref 9.6–13.1)

## 2024-06-23 NOTE — Progress Notes (Unsigned)
 Kelly [name] [id number] 1951-05-19   Chief Complaint:  Referring Provider: Duanne Butler DASEN, MD Primary GI MD: Dr. Wilhelmenia  HPI: Kelly [name] is a 73 y.o. 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    Last seen in office 06/19/2022 by Dr. Wilhelmenia.  Patient previously followed with Dr. Teressa for years. Patient hemodynamically and clinically stable at time of last visit.  Based on labs from earlier that year she had compensated Kelly [name] A cirrhosis with a MELD 3.0 score of 14. Plan at that time was to update MELD labs and [facility] screening. Colonoscopy due in 2030 pending patient's overall health and comorbidities. Found to be immune to hepatitis A but not immune to hepatitis B and vaccination was offered. Potassium level was slightly low at 3.3 and was advised to take additional prescription of 20 mill equivalents extra per day to the dose she was already taking.  Liver biochemical testing and platelet counts were stable.  MELD score was approximately 16 over the summer, on updated labs was 12.  Patient seen in the ED 06/03/2024 for complaint of 4 days of diarrhea.  Endorsed having some bleeding but unsure if rectal or vaginal bleeding.  Endorsed waking up that morning with an area of blood and diarrhea on the sheets.  Noted to have hemorrhoids on rectal exam with perianal skin inflammation likely secondary to diarrhea and loose stool around rectal vault.  Hemoccult was negative. On serologic workup noted to have chronically low white blood cells and platelets.  Hemoglobin improved compared to previous CBC.  Had mildly elevated creatinine and mildly  decreased GFR from baseline.  Dehydration suspected and she was given a fluid bolus.  Bilirubin and protein at baseline.  Lipase unremarkable. CT A/P showed evidence of colitis, splenomegaly, portal hypertension, pelvic varices, cholelithiasis. Patient noted to have appointment with PCP the following day and was advised to follow-up outpatient.  Discharged with course of Augmentin .  Patient seen by PCP 06/04/2024.  Endorsed only taking nadolol  and Augmentin , no other medications at that time.  Patient's history is uncertain due to dementia, and she was noted to be very confused.  Husband stated that patient had been reporting blood when she goes to the bathroom for quite some time, uncertain whether it is coming from stool or from her vagina.  A speculum exam was performed that day, there was bright red blood around her rectum and on her gluteus.  No blood in the vagina.  Thought to be certainly having bleeding from rectum.  Patient noted to be at high risk for bleeding hemorrhoids due to portal hypertension, diverticulosis also a possibility.  Noted to be losing a fair amount of weight so malignancy also in the differential.  Referred to GI for colonoscopy to determine source of bleeding.  Patient not a liver transplant candidate due to her severe dementia.  No evidence of fluid overload or hepatic encephalopathy at time of PCP visit.  He believes her memory is continuing to deteriorate, she is uncertain of the medication she is taking, having a difficult time with short-term memory, thought to likely need more supervision than she is currently receiving at home from her husband and plan was to discuss situation with her son who is  power of attorney.   Labs 06/04/2024 showed stable anemia, platelet count 41, low potassium 3.1, total bilirubin 1.9 otherwise normal liver enzymes, normal TSH. Advised to take potassium 20 mill equivalents daily for 5 days.   Has had some significant weight loss Fatigue,  weakness Loss of appetite   Nadolol  Taking spironolactone , husband thinks she is taking lasix  Not on protonix  Not taking potassium right now Not taking iron  Not on synthroid  Taking trazodone  at bedtime Not taking prozac  Not on losartan       Discussed the use of AI scribe software for clinical note transcription with the patient, who gave verbal consent to proceed.  History of Present Illness Kelly [name] is a 73 year old female with cirrhosis who presents with rectal bleeding and weight loss. She is accompanied by her husband of fifty-six years. She was referred by her primary care doctor for evaluation of rectal bleeding.  Rectal bleeding and lower gastrointestinal symptoms - Rectal bleeding initially mistaken for vaginal bleeding - Recent examination revealed blood; vaginal exam did not identify a source of bleeding - Last colonoscopy in 2023 showed hemorrhoids and dilated blood vessels in the rectum - Recent ER visit: stool test for blood was negative; CT scan demonstrated colitis - Completed a course of amoxicillin , which improved diarrhea - Currently has one solid bowel movement per day without blood - No current diarrhea  Unintentional weight loss and anorexia - Significant weight loss over the past few months - Decreased appetite; feels hungry but loses desire to eat once she starts - No longer snacks, only eats meals - No pain with eating, dysphagia, or regurgitation  Abdominal pain - Occasional mild abdominal pain, unrelated to eating - Pain lasts a few minutes and then resolves  Cirrhosis and related symptoms - History of cirrhosis - No jaundice, lower extremity edema, or persistent abdominal bloating  Anemia and electrolyte disturbance - History of anemia - Completed a five-day course of potassium supplements for hypokalemia, likely secondary to diarrhea and dehydration  Cardiac history - History of aortic valve replacement  Tobacco use - Smokes half  a pack per day for fifty-six years      Previous GI Procedures/Imaging   CT A/P 06/03/2024 IMPRESSION: 1. Mild diffuse colonic wall thickening without surrounding inflammatory changes. Correlate clinically for colitis. 2. Nodular liver contour compatible with cirrhosis with sequelae of portal hypertension, including paraesophageal varices, recanalized umbilical vein, and pelvic varices. 3. Severe splenomegaly measuring up to 20 cm, unchanged. 4. Stable 1 cm left lower lobe pulmonary nodule unchanged since 2021; no routine follow-up imaging recommended per Fleischner Society Guidelines. 5. Cholelithiasis.  August 2023 EGD - No gross lesions in esophagus proximally. Multiple columns of Grade I and grade II esophageal varices with no bleeding and no stigmata of recent bleeding. - Z-line irregular, 41 cm from the incisors. - Portal hypertensive gastropathy throughout. - Erosive gastropathy with no bleeding and no stigmata of recent bleeding. Biopsied. - No gross lesions in the duodenal bulb, in the first portion of the duodenum and in the second portion of the duodenum.   August 2023 colonoscopy - Hemorrhoids found on digital rectal exam. - The examined portion of the ileum was normal. - One 7 mm polyp in the sigmoid colon, removed with a cold snare. Resected and retrieved. - Friability with contact bleeding in the entire examined colon, but otherwise normal appearing mucosa throughout. - Diverticulosis in the recto-sigmoid colon, in the sigmoid colon, in the descending colon and in the transverse colon. -  Very small rectal varices. - Non-bleeding non-thrombosed external and internal hemorrhoids.   Pathology FINAL MICROSCOPIC DIAGNOSIS:  A.   SIGMOID COLON, POLYP, BIOPSY:  Tubular adenoma  Negative for high-grade dysplasia and carcinoma  Margin free of adenomatous change  B.   GASTRIC BIOPSY:       Reactive gastropathy  Negative for H. pylori, intestinal metaplasia, dysplasia  and carcinoma   M[place]ollum 2023 CT abdomen pelvis IMPRESSION: Interval development of cirrhosis with evidence of portal venous hypertension with moderate splenomegaly and portosystemic collateralization via gastroesophageal and rectal varices. Superimposed mild hepatic steatosis. Moderate right coronary artery calcification. Moderate sigmoid diverticulosis. Bilateral L5 pars defects with progressive, grade 1 anterolisthesis L5-S1. Aortic Atherosclerosis (ICD10-I70.0).  Past Medical History:  Diagnosis Date   Anxiety    Chronic upper GI bleeding    intestinal AVM's, portal gastropathy, esophageal varices on egd 04/2018   Coronary artery disease involving native coronary artery without angina pectoris    Dementia (HCC) 09/16/2016   Depression    DIVERTICULITIS OF COLON 06/14/2008   Qualifier: Diagnosis of  By: Tivis RN, Channing     DM (diabetes mellitus), type 2 (HCC) 09/15/2016   Esophageal varices determined by endoscopy (HCC)    Hyperlipidemia    Hypertension    Hypothyroidism    Incidental pulmonary nodule, greater than or equal to 8mm 10/14/2018   LLL nodule needs f/u CT or PET-CT scan   Irritable bowel syndrome 04/06/2008   Centricity Description: IBS Qualifier: Diagnosis of  By: Ezzard CMA (NANNIE), [name]   Centricity Description: IRRITABLE BOWEL SYNDROME Qualifier: Diagnosis of  By: Teressa MD, Toribio SQUIBB    Liver cirrhosis secondary to NASH (HCC) 09/16/2016   NASH (nonalcoholic steatohepatitis)    Obesity    Portal hypertensive gastropathy (HCC)    S/P TAVR (transcatheter aortic valve replacement) 04/20/2019   a. 04/20/19: s/p TAVR wtih a 23mm Medtronic CoreValve Evolut Pro via the TF approach   Severe aortic stenosis    Splenomegaly 09/16/2016   Thrombocytopenia    Thyroid  disease    Tobacco abuse    Vitamin D deficiency     Past Surgical History:  Procedure Laterality Date   BIOPSY  04/04/2022   Procedure: BIOPSY;  Surgeon: Wilhelmenia Aloha Raddle., MD;  Location: [facility];   Service: Gastroenterology;;   CARDIAC CATHETERIZATION     COLONOSCOPY WITH PROPOFOL  N/A 04/09/2018   Procedure: COLONOSCOPY WITH PROPOFOL ;  Surgeon: Teressa Toribio SQUIBB, MD;  Location: [facility];  Service: Endoscopy;  Laterality: N/A;   COLONOSCOPY WITH PROPOFOL  N/A 04/04/2022   Procedure: COLONOSCOPY WITH PROPOFOL ;  Surgeon: Wilhelmenia Aloha Raddle., MD;  Location: [facility];  Service: Gastroenterology;  Laterality: N/A;   CORONARY ANGIOPLASTY     CORONARY STENT INTERVENTION N/A 02/11/2019   Procedure: CORONARY STENT INTERVENTION;  Surgeon: Verlin Lonni BIRCH, MD;  Location: [facility];  Service: Cardiovascular;  Laterality: N/A;  Prox RCA Prox LAD   ESOPHAGOGASTRODUODENOSCOPY (EGD) WITH PROPOFOL  N/A 04/09/2018   Procedure: ESOPHAGOGASTRODUODENOSCOPY (EGD) WITH PROPOFOL ;  Surgeon: Teressa Toribio SQUIBB, MD;  Location: [facility];  Service: Endoscopy;  Laterality: N/A;   ESOPHAGOGASTRODUODENOSCOPY (EGD) WITH PROPOFOL  N/A 04/04/2022   Procedure: ESOPHAGOGASTRODUODENOSCOPY (EGD) WITH PROPOFOL ;  Surgeon: Wilhelmenia Aloha Raddle., MD;  Location: [facility];  Service: Gastroenterology;  Laterality: N/A;   oopherectomy     OVARIAN CYST REMOVAL     POLYPECTOMY  04/09/2018   Procedure: POLYPECTOMY;  Surgeon: Teressa Toribio SQUIBB, MD;  Location: [facility];  Service: Endoscopy;;   POLYPECTOMY  04/04/2022  Procedure: POLYPECTOMY;  Surgeon: Wilhelmenia Aloha Raddle., MD;  Location: [facility];  Service: Gastroenterology;;   RIGHT/LEFT HEART CATH AND CORONARY ANGIOGRAPHY N/A 10/07/2018   Procedure: RIGHT/LEFT HEART CATH AND CORONARY ANGIOGRAPHY;  Surgeon: Verlin Lonni BIRCH, MD;  Location: [facility];  Service: Cardiovascular;  Laterality: N/A;   TEE WITHOUT CARDIOVERSION N/A 04/20/2019   Procedure: TRANSESOPHAGEAL ECHOCARDIOGRAM (TEE);  Surgeon: Verlin Lonni BIRCH, MD;  Location: [facility] OR;  Service: Open Heart Surgery;  Laterality: N/A;   TRANSCATHETER AORTIC VALVE REPLACEMENT, TRANSFEMORAL  N/A 04/20/2019   Procedure: TRANSCATHETER AORTIC VALVE REPLACEMENT, TRANSFEMORAL approach using 23mm Evolut PRO+ medtronic valve;  Surgeon: Verlin Lonni BIRCH, MD;  Location: [facility];  Service: Open Heart Surgery;  Laterality: N/A;    Current Outpatient Medications  Medication Sig Dispense Refill   nadolol  (CORGARD ) 20 MG tablet Take 1 tablet (20 mg total) by mouth daily. 90 tablet 3   potassium chloride  (KLOR-CON  M) 10 MEQ tablet Take 10 mEq by mouth 2 (two) times daily.     spironolactone  (ALDACTONE ) 25 MG tablet Take 1 tablet (25 mg total) by mouth daily. 90 tablet 3   traZODone  (DESYREL ) 50 MG tablet TAKE 1 TABLET BY MOUTH EVERY DAY AT BEDTIME AS NEEDED 90 tablet 2   cyanocobalamin  (VITAMIN B12) 1000 MCG tablet Take 1 tablet (1,000 mcg total) by mouth daily. (Patient not taking: Reported on 06/23/2024) 30 tablet 3   FLUoxetine  (PROZAC ) 40 MG capsule Take 1 capsule (40 mg total) by mouth daily. Appointment needed w PCP. (Patient not taking: Reported on 06/23/2024) 90 capsule 0   furosemide  (LASIX ) 20 MG tablet Take 20 mg by mouth daily. (Patient not taking: Reported on 06/23/2024)     Iron , Ferrous Sulfate , 325 (65 Fe) MG TABS Take 325 mg by mouth daily. (Patient not taking: Reported on 06/23/2024) 30 tablet 3   levothyroxine  (SYNTHROID ) 88 MCG tablet Take 1 tablet (88 mcg total) by mouth daily. (Patient not taking: Reported on 06/23/2024) 90 tablet 3   losartan  (COZAAR ) 100 MG tablet TAKE 1 TABLET EVERY DAY (Patient not taking: Reported on 06/23/2024) 90 tablet 1   pantoprazole  (PROTONIX ) 40 MG tablet TAKE 1 TABLET EVERY DAY (Patient not taking: Reported on 06/23/2024) 90 tablet 3   No current facility-administered medications for this visit.    Allergies as of 06/23/2024 - Review Complete 06/23/2024  Allergen Reaction Noted   Prednisone  Itching 01/13/2013   Celecoxib Rash    Macrodantin [nitrofurantoin macrocrystal] Rash 06/03/2016    Family History  Problem Relation Age of Onset    Alzheimer's disease Mother    Dementia Mother    Other Mother        twisted colon    Lung cancer Father    Heart disease Father        MI at age 60   Alcohol abuse Father    Stomach cancer Neg Hx    Colon cancer Neg Hx    Esophageal cancer Neg Hx    Pancreatic cancer Neg Hx    Inflammatory bowel disease Neg Hx    Liver disease Neg Hx    Rectal cancer Neg Hx     Social History   Tobacco Use   Smoking status: Every Day    Current packs/day: 0.50    Types: Cigarettes   Smokeless tobacco: Never  Vaping Use   Vaping status: Never Used  Substance Use Topics   Alcohol use: No   Drug use: Not Currently    Types: Benzodiazepines  Review of Systems:    Constitutional: No weight loss, fever, chills, weakness or fatigue Eyes: No change in vision Ears, Nose, Throat:  No change in hearing or congestion Skin: No rash or itching Cardiovascular: No chest pain, chest pressure or palpitations   Respiratory: No SOB or cough Gastrointestinal: See HPI and otherwise negative Genitourinary: No dysuria or change in urinary frequency Neurological: No headache, dizziness or syncope Musculoskeletal: No new muscle or joint pain Hematologic: No bleeding or bruising    Physical Exam:  Vital signs: BP 136/84   Pulse (!) 48   Ht 5' 4 (1.626 m)   Wt 152 lb (68.9 kg)   SpO2 99%   BMI 26.09 kg/m   Wt Readings from Last 3 Encounters:  06/23/24 152 lb (68.9 kg)  06/04/24 149 lb (67.6 kg)  04/12/24 171 lb (77.6 kg)     Constitutional: NAD, Well developed, Well nourished, alert and cooperative Head:  Normocephalic and atraumatic.  Eyes: No scleral icterus. Conjunctiva pink. Mouth: No oral lesions. Respiratory: Respirations even and unlabored. Lungs clear to auscultation bilaterally.  No wheezes, crackles, or rhonchi.  Cardiovascular:  Regular rate and rhythm. No murmurs. No peripheral edema. Gastrointestinal:  Soft, nondistended, nontender. No rebound or guarding. Normal bowel  sounds. No appreciable masses or hepatomegaly. Rectal:  Not performed.  Neurologic:  Alert and oriented x4;  grossly normal neurologically.  Skin:   Dry and intact without significant lesions or rashes. Psychiatric: Oriented to person, place and time. Demonstrates good judgement and reason without abnormal affect or behaviors.   RELEVANT LABS AND IMAGING: CBC    Component Value Date/Time   WBC 4.6 06/04/2024 1458   RBC 3.74 (L) 06/04/2024 1458   HGB 11.2 (L) 06/04/2024 1458   HGB 9.4 (L) 04/14/2019 0942   HCT 31.9 (L) 06/04/2024 1458   HCT 27.4 (L) 04/14/2019 0942   PLT 41 (L) 06/04/2024 1458   PLT 143 (L) 04/14/2019 0942   MCV 85.3 06/04/2024 1458   MCV 86 04/14/2019 0942   MCH 29.9 06/04/2024 1458   MCHC 35.1 06/04/2024 1458   RDW 14.9 06/04/2024 1458   RDW 14.1 04/14/2019 0942   LYMPHSABS 0.7 06/03/2024 1925   LYMPHSABS 2.3 02/04/2019 1517   MONOABS 0.2 06/03/2024 1925   EOSABS 239 06/04/2024 1458   EOSABS 0.2 02/04/2019 1517   BASOSABS 41 06/04/2024 1458   BASOSABS 0.1 02/04/2019 1517    CMP     Component Value Date/Time   NA 140 06/04/2024 1458   NA 141 04/28/2019 1448   K 3.1 (L) 06/04/2024 1458   CL 107 06/04/2024 1458   CO2 24 06/04/2024 1458   GLUCOSE 91 06/04/2024 1458   BUN 8 06/04/2024 1458   BUN 8 04/28/2019 1448   CREATININE 0.88 06/04/2024 1458   CALCIUM  8.6 06/04/2024 1458   PROT 6.3 06/04/2024 1458   ALBUMIN 3.2 (L) 06/03/2024 1233   AST 26 06/04/2024 1458   ALT 14 06/04/2024 1458   ALKPHOS 72 06/03/2024 1233   BILITOT 1.9 (H) 06/04/2024 1458   GFRNONAA 51 (L) 06/03/2024 1233   GFRNONAA 89 01/18/2021 1219   GFRAA 103 01/18/2021 1219   Echocardiogram 04/15/2024 1. Left ventricular ejection fraction, by estimation, is 60 to 65% . Left ventricular ejection fraction by 2D MOD biplane is 62. 9 % . The left ventricle has normal function. The left ventricle has no regional wall motion abnormalities. There is mild left ventricular hypertrophy. Left  ventricular diastolic parameters are consistent with Grade I  diastolic dysfunction ( impaired relaxation) .  2. Right ventricular systolic function is low normal. The right ventricular size is normal. There is mildly elevated pulmonary artery systolic pressure. The estimated right ventricular systolic pressure is 36. 2 mmHg.  3. Left atrial size was moderately dilated.  4. The mitral valve is grossly normal. Mild mitral valve regurgitation.  5. Trivial posterior perivalvular leak. The aortic valve has been repaired/ replaced. Aortic valve regurgitation is trivial. There is a valve present in the aortic position. Echo findings are consistent with perivalvular leak of the aortic prosthesis. Aortic valve area, by VTI measures 1. 51 cm . Aortic valve mean gradient measures 20. 0 mmHg. Aortic valve Vmax measures 2. 96 m/ s. Peak gradient 35. 1 mmHg, DI 0. 48.  6. Aortic dilatation noted. There is borderline dilatation of the ascending aorta, measuring 39 mm.  7. The inferior vena cava is normal in size with greater than 50% respiratory variability, suggesting right atrial pressure of 3 mmHg.  Assessment/Plan:    Assessment and Plan Assessment & Plan Rectal bleeding, resolved Rectal bleeding likely from hemorrhoids and portal hypertensive anorectal changes due to cirrhosis. Previous colonoscopy showed hemorrhoids and dilated vessels. Recent CT indicated colitis without specific bleeding source. Blood counts stable, anemia not worsening. - Scheduled colonoscopy for January 2nd, 2026, with Dr. Washington. - Discuss with Dr. Washington about potential upper endoscopy for decreased appetite and weight loss. - Recheck blood counts and tumor marker for liver cancer. - Evaluate clotting function to assess liver disease severity.  Cirrhosis of liver with esophageal varices and portal hypertensive anorectal changes Cirrhosis with esophageal varices and portal hypertensive anorectal changes. Nadolol  used to  prevent variceal bleeding. No liver cancer on recent CT. - Continue nadolol  to prevent variceal bleeding. - Check tumor marker for liver cancer. - Evaluate clotting function to assess liver disease severity.  Unintentional weight loss and decreased appetite Significant weight loss and decreased appetite. No abdominal pain, vomiting, or fever. CT showed stable pulmonary nodule, no concerning stomach findings. Differential includes malignancy or gastrointestinal issues. - Scheduled upper endoscopy for decreased appetite and weight loss. - Ordered chest CT to evaluate stable pulmonary nodule and assess other weight loss causes.  History of anemia, currently stable Anemia well-managed, blood counts stable. - Recheck blood counts to ensure stability.  History of colitis, resolved Previous colitis episode resolved post-amoxicillin . No current diarrhea or blood in stools.  Stable pulmonary nodule, left lower lung Stable left lower lung nodule, unchanged since 2021. Further evaluation needed due to smoking history and weight loss. - Ordered chest CT to evaluate stable pulmonary nodule and assess other weight loss causes.  Presence of prosthetic aortic valve Prosthetic aortic valve in place, no current issues. - Continue monitoring cardiac status.  History of tobacco use Long smoking history, currently half a pack per day. No recent lung cancer screenings. - Ordered chest CT to evaluate for lung cancer due to smoking history and weight loss.   Decreased appetite Early satiety Abdominal pain (not severe, lasts a couple minutes)    Labs: CBC, CMP, PT/INR, AFP Up-to-date on HCC screening with imaging, having had CT scan in ER. Needs a colonoscopy, due for repeat EGD as well, schedule together if possible but colonoscopy would be first priority given rectal bleeding Hospital in event variceal banding needed  Recheck HR Still low, check with Dr. Wilhelmenia regarding adjusting  nadolol   No abnormal findings on exam, though has mild systolic murmur w/ h/o TAVR  Camie Furbish, PA-C [facility] 06/23/2024, 11:16 AM  Patient Care Team: Duanne Butler DASEN, MD as PCP - General (Family Medicine) Verlin Lonni BIRCH, MD as PCP - Cardiology (Cardiology) Nicholaus Sherlean CROME, [facility] (Inactive) as Pharmacist (Pharmacist)

## 2024-06-23 NOTE — Telephone Encounter (Signed)
 Critical value called Platelets 49

## 2024-06-23 NOTE — Patient Instructions (Addendum)
 We will reach out to you in regards to scheduling a colonoscopy/endoscopy.   Your provider has requested that you go to the basement level for lab work before leaving today. Press B on the elevator. The lab is located at the first door on the left as you exit the elevator.

## 2024-06-24 ENCOUNTER — Encounter: Payer: Self-pay | Admitting: Gastroenterology

## 2024-06-25 ENCOUNTER — Ambulatory Visit: Payer: Self-pay | Admitting: Gastroenterology

## 2024-06-25 LAB — AFP TUMOR MARKER: AFP-Tumor Marker: 3.6 ng/mL

## 2024-06-27 NOTE — Progress Notes (Signed)
 Attending Physician's Attestation   I have reviewed the chart.   I agree with the Advanced Practitioner's note, impression, and recommendations with any updates as below. Suspect that this is most likely hemorrhoidal in nature though rectal varices can certainly be a source of bleeding that would need further attention evaluation.  Rectal varices, usually however do not stop bleeding and are much more massive blood loss.  In any case colonoscopy plus EGD is reasonable.  Suspect her thrombocytopenia is causing some chronic blood loss as we noted on colonoscopy even suction and passage of the scope led to bruising.  Video capsule endoscopy [place] be required.  Recommend updated laboratories including a iron /TIBC/ferritin/B12/folate be obtained.  She can be scheduled for my next available EGD/colonoscopy slot in the hospital-based setting.  Repeat hemoglobin check in 2 weeks.  Start Anusol  suppositories for now nightly for 2 weeks.   Aloha Finner, MD [facility] Advanced Endoscopy Office # [phone]

## 2024-06-28 ENCOUNTER — Telehealth: Payer: Self-pay

## 2024-06-28 ENCOUNTER — Telehealth: Payer: Self-pay | Admitting: Gastroenterology

## 2024-06-28 DIAGNOSIS — K625 Hemorrhage of anus and rectum: Secondary | ICD-10-CM

## 2024-06-28 MED ORDER — HYDROCORTISONE ACETATE 25 MG RE SUPP: 25.0000 mg | Freq: Every day | RECTAL | 0 refills | Status: AC

## 2024-06-28 NOTE — Telephone Encounter (Signed)
 Lab orders entered  Anusol  sent to the pharmacy   Labs stable- bili slightly elevated  Needs labs in 1 week Endo colon [facility] with [facility]   Left message on machine to call back

## 2024-06-28 NOTE — Telephone Encounter (Signed)
 Please disregard.  I see that you have already placed orders for EGD/colon as well as repeat labs, and Anusol .  Thank you.

## 2024-06-28 NOTE — Telephone Encounter (Signed)
 Author: Mansouraty, Aloha Raddle., MD Service: Gastroenterology Author Type: Physician  Filed: 06/27/2024  6:47 AM Encounter Date: 06/23/2024 Status: Signed  Editor: Mansouraty, Aloha Raddle., MD (Physician)    Attending Physician's Attestation    I have reviewed the chart.    I agree with the Advanced Practitioner's note, impression, and recommendations with any updates as below. Suspect that this is most likely hemorrhoidal in nature though rectal varices can certainly be a source of bleeding that would need further attention evaluation.  Rectal varices, usually however do not stop bleeding and are much more massive blood loss.  In any case colonoscopy plus EGD is reasonable.  Suspect her thrombocytopenia is causing some chronic blood loss as we noted on colonoscopy even suction and passage of the scope led to bruising.  Video capsule endoscopy Sher[place] be required.  Recommend updated laboratories including a iron /TIBC/ferritin/B12/folate be obtained.  She can be scheduled for my next available EGD/colonoscopy slot in the hospital-based setting.  Repeat hemoglobin check in 2 weeks.  Start Anusol  suppositories for now nightly for 2 weeks.     Aloha Finner, MD [facility] Advanced Endoscopy Office # [phone]

## 2024-06-28 NOTE — Telephone Encounter (Signed)
 PT returning call. Please advise.

## 2024-06-28 NOTE — Telephone Encounter (Signed)
 Left message on machine to call back

## 2024-06-29 ENCOUNTER — Other Ambulatory Visit: Payer: Self-pay

## 2024-06-29 DIAGNOSIS — K625 Hemorrhage of anus and rectum: Secondary | ICD-10-CM

## 2024-06-29 DIAGNOSIS — R634 Abnormal weight loss: Secondary | ICD-10-CM

## 2024-06-29 MED ORDER — NA SULFATE-K SULFATE-MG SULF 17.5-3.13-1.6 GM/177ML PO SOLN: 1.0000 | Freq: Once | ORAL | 0 refills | Status: AC

## 2024-06-29 NOTE — Telephone Encounter (Signed)
 Endo colon has been set up for 08/26/24 at 140 pm at [facility] with [facility]

## 2024-06-29 NOTE — Telephone Encounter (Signed)
 Left message on machine to call back

## 2024-06-30 NOTE — Telephone Encounter (Signed)
 Left message on machine to call back    I have been unable to reach pt by phone. I will send a letter and message to My Chart- she does view messages

## 2024-07-05 NOTE — Telephone Encounter (Signed)
 Inbound call from patient states she is returning a call. Please give a call if needing to further advise.   Thank you

## 2024-07-05 NOTE — Telephone Encounter (Signed)
 Unable to reach pt by phone  I have sent all information to the pt My Chart and left a message on her voice mail again to return call if she has any further questions.

## 2024-07-05 NOTE — Telephone Encounter (Signed)
 Suspect that this is most likely hemorrhoidal in nature though rectal varices can certainly be a source of bleeding that would need further attention evaluation.     Rectal varices, usually however do not stop bleeding and are much more massive blood loss.  In any case colonoscopy plus EGD is reasonable.     Suspect her thrombocytopenia is causing some chronic blood loss as we noted on colonoscopy even suction and passage of the scope led to bruising.     Video capsule endoscopy [place] be required.  Recommend updated laboratories.  Please come to our basement lab at [address] [facility] in 2 weeks.  We have you scheduled for EGD/colonoscopy on 08/26/24 at 140 pm. I have sent the instructions to you as well. Please pick up the prep in the next week or two from the pharmacy.     We have also sent Anusol  suppositories for now nightly for 2 weeks.     Aloha Finner, MD

## 2024-08-12 ENCOUNTER — Ambulatory Visit

## 2024-08-12 ENCOUNTER — Encounter (HOSPITAL_COMMUNITY): Payer: Self-pay | Admitting: Gastroenterology

## 2024-08-12 VITALS — Ht 64.0 in | Wt 152.0 lb

## 2024-08-12 DIAGNOSIS — Z0000 Encounter for general adult medical examination without abnormal findings: Secondary | ICD-10-CM

## 2024-08-12 NOTE — Progress Notes (Signed)
 Kelly [name]    PCP-Pickard MD Cardiologist-McAlhany MD Neurologist- n/a  EKG-06/04/24 Echo-04/15/24 Cath- 2020     Stress-n/a ICD/PM-n/a GLP1-n/a Blood Thinner-n/a  History: CAD, HTN, DM, NASH, Varices, TAVR, Aortic Stenosis, Dementia. Patient last saw her pcp 06/04/24 was concerned for rectal bleeding and dementia. Also last saw cardiology in 2023 where they recommended 1 year f/u. Per patient no heart or breathing issues, denies chest pains/sob/ no equipment use.    Anesthesia Review- Yes- seems urgent echo done by pcp okay to proceed

## 2024-08-12 NOTE — Patient Instructions (Signed)
 Ms. Loughridge,  Thank you for taking the time for your Medicare Wellness Visit. I appreciate your continued commitment to your health goals. Please review the care plan we discussed, and feel free to reach out if I can assist you further.  Please note that Annual Wellness Visits do not include a physical exam. Some assessments Walski be limited, especially if the visit was conducted virtually. If needed, we [place] recommend an in-person follow-up with your provider.  Ongoing Care Seeing your primary care provider every 3 to 6 months helps us  monitor your health and provide consistent, personalized care.   Referrals If a referral was made during today's visit and you haven't received any updates within two weeks, please contact the referred provider directly to check on the status.  Recommended Screenings:  Health Maintenance  Topic Date Due   Eye exam for diabetics  Never done   Yearly kidney health urinalysis for diabetes  Never done   Zoster (Shingles) Vaccine (1 of 2) Never done   Osteoporosis screening with Bone Density Scan  Never done   Pneumococcal Vaccine for age over 56 (2 of 2 - PCV) 04/20/2020   Complete foot exam   01/21/2024   COVID-19 Vaccine (2 - 2025-26 season) 04/05/2024   Flu Shot  11/02/2024*   Hemoglobin A1C  10/10/2024   Yearly kidney function blood test for diabetes  06/23/2025   Medicare Annual Wellness Visit  08/12/2025   Colon Cancer Screening  04/04/2032   Hepatitis C Screening  Completed   Meningitis B Vaccine  Aged Out   DTaP/Tdap/Td vaccine  Discontinued   Breast Cancer Screening  Discontinued  *Topic was postponed. The date shown is not the original due date.       08/12/2024   10:40 AM  Advanced Directives  Does Patient Have a Medical Advance Directive? No  Would patient like information on creating a medical advance directive? Yes (MAU/Ambulatory/Procedural Areas - Information given)   Information on Advanced Care Planning can be found at [place]   Secretary of [facility] Advance Health Care Directives Advance Health Care Directives ([email or web address]    Vision: Annual vision screenings are recommended for early detection of glaucoma, cataracts, and diabetic retinopathy. These exams can also reveal signs of chronic conditions such as diabetes and high blood pressure.  Dental: Annual dental screenings help detect early signs of oral cancer, gum disease, and other conditions linked to overall health, including heart disease and diabetes.  Please see the attached documents for additional preventive care recommendations.

## 2024-08-12 NOTE — Progress Notes (Signed)
 "  Chief Complaint  Patient presents with   Medicare Wellness     Subjective:   Kelly [name] is a 74 y.o. female who presents for a Medicare Annual Wellness Visit.  Visit info / Clinical Intake: Medicare Wellness Visit Type:: Subsequent Annual Wellness Visit Persons participating in visit and providing information:: patient Medicare Wellness Visit Mode:: Telephone If telephone:: video declined Since this visit was completed virtually, some vitals Hon be partially provided or unavailable. Missing vitals are due to the limitations of the virtual format.: Documented vitals are patient reported If Telephone or Video please confirm:: I connected with patient using audio/video enable telemedicine. I verified patient identity with two identifiers, discussed telehealth limitations, and patient agreed to proceed. Patient Location:: home Provider Location:: office Interpreter Needed?: No Pre-visit prep was completed: yes AWV questionnaire completed by patient prior to visit?: no Living arrangements:: (!) lives alone Patient's Overall Health Status Rating: (!) fair Typical amount of pain: some Does pain affect daily life?: (!) yes Are you currently prescribed opioids?: no  Dietary Habits and Nutritional Risks How many meals a day?: (!) 1 (voices poor appetite) Eats fruit and vegetables daily?: yes Most meals are obtained by: preparing own meals In the last 2 weeks, have you had any of the following?: none Diabetic:: (!) yes Any non-healing wounds?: no How often do you check your BS?: 0 Would you like to be referred to a Nutritionist or for Diabetic Management? : no  Functional Status Activities of Daily Living (to include ambulation/medication): Independent Ambulation: Independent Medication Administration: Independent Home Management (perform basic housework or laundry): Independent Manage your own finances?: yes Primary transportation is: driving Concerns about vision?: no *vision  screening is required for WTM* Concerns about hearing?: no  Fall Screening Falls in the past year?: 0 Number of falls in past year: 0 Was there an injury with Fall?: 0 Fall Risk Category Calculator: 0 Patient Fall Risk Level: Low Fall Risk  Fall Risk Patient at Risk for Falls Due to: Impaired mobility Fall risk Follow up: Falls evaluation completed; Education provided; Falls prevention discussed  Home and Transportation Safety: All rugs have non-skid backing?: yes All stairs or steps have railings?: yes Grab bars in the bathtub or shower?: yes Have non-skid surface in bathtub or shower?: yes Good home lighting?: yes Regular seat belt use?: yes Hospital stays in the last year:: no  Cognitive Assessment Difficulty concentrating, remembering, or making decisions? : yes Will 6CIT or Mini Cog be Completed: no 6CIT or Mini Cog Declined: patient has a diagnosis of dementia or cognitive impairment  Advance Directives (For Healthcare) Does Patient Have a Medical Advance Directive?: No Would patient like information on creating a medical advance directive?: Yes (MAU/Ambulatory/Procedural Areas - Information given)  Reviewed/Updated  Reviewed/Updated: Reviewed All (Medical, Surgical, Family, Medications, Allergies, Care Teams, Patient Goals)    Allergies (verified) Prednisone , Celecoxib, and Macrodantin [nitrofurantoin macrocrystal]   Current Medications (verified) Outpatient Encounter Medications as of 08/12/2024  Medication Sig   nadolol  (CORGARD ) 20 MG tablet Take 1 tablet (20 mg total) by mouth daily.   traZODone  (DESYREL ) 50 MG tablet TAKE 1 TABLET BY MOUTH EVERY DAY AT BEDTIME AS NEEDED   cyanocobalamin  (VITAMIN B12) 1000 MCG tablet Take 1 tablet (1,000 mcg total) by mouth daily. (Patient not taking: Reported on 08/12/2024)   FLUoxetine  (PROZAC ) 40 MG capsule Take 1 capsule (40 mg total) by mouth daily. Appointment needed w PCP. (Patient not taking: Reported on 08/12/2024)    furosemide  (LASIX ) 20 MG  tablet Take 20 mg by mouth daily. (Patient not taking: Reported on 08/12/2024)   Iron , Ferrous Sulfate , 325 (65 Fe) MG TABS Take 325 mg by mouth daily. (Patient not taking: Reported on 08/12/2024)   levothyroxine  (SYNTHROID ) 88 MCG tablet Take 1 tablet (88 mcg total) by mouth daily. (Patient not taking: Reported on 08/12/2024)   losartan  (COZAAR ) 100 MG tablet TAKE 1 TABLET EVERY DAY (Patient not taking: Reported on 08/12/2024)   pantoprazole  (PROTONIX ) 40 MG tablet TAKE 1 TABLET EVERY DAY (Patient not taking: Reported on 08/12/2024)   potassium chloride  (KLOR-CON  M) 10 MEQ tablet Take 10 mEq by mouth 2 (two) times daily. (Patient not taking: Reported on 08/12/2024)   spironolactone  (ALDACTONE ) 25 MG tablet Take 1 tablet (25 mg total) by mouth daily. (Patient not taking: Reported on 08/12/2024)   No facility-administered encounter medications on file as of 08/12/2024.    History: Past Medical History:  Diagnosis Date   Anxiety    Chronic upper GI bleeding    intestinal AVM's, portal gastropathy, esophageal varices on egd 04/2018   Coronary artery disease involving native coronary artery without angina pectoris    Dementia (HCC) 09/16/2016   Depression    DIVERTICULITIS OF COLON 06/14/2008   Qualifier: Diagnosis of  By: Tivis RN, Channing     DM (diabetes mellitus), type 2 (HCC) 09/15/2016   Esophageal varices determined by endoscopy (HCC)    Hyperlipidemia    Hypertension    Hypothyroidism    Incidental pulmonary nodule, greater than or equal to 8mm 10/14/2018   LLL nodule needs f/u CT or PET-CT scan   Irritable bowel syndrome 04/06/2008   Centricity Description: IBS Qualifier: Diagnosis of  By: Ezzard CMA (NANNIE), [name]   Centricity Description: IRRITABLE BOWEL SYNDROME Qualifier: Diagnosis of  By: Teressa MD, Toribio SQUIBB    Liver cirrhosis secondary to NASH (HCC) 09/16/2016   NASH (nonalcoholic steatohepatitis)    Obesity    Portal hypertensive gastropathy (HCC)    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 04/20/19: s/p TAVR wtih a 23mm Medtronic CoreValve Evolut Pro via the TF approach   Severe aortic stenosis    Splenomegaly 09/16/2016   Thrombocytopenia    Thyroid  disease    Tobacco abuse    Vitamin D deficiency    Past Surgical History:  Procedure Laterality Date   BIOPSY  04/04/2022   Procedure: BIOPSY;  Surgeon: Wilhelmenia Aloha Raddle., MD;  Location: [facility];  Service: Gastroenterology;;   CARDIAC CATHETERIZATION     COLONOSCOPY WITH PROPOFOL  N/A 04/09/2018   Procedure: COLONOSCOPY WITH PROPOFOL ;  Surgeon: Teressa Toribio SQUIBB, MD;  Location: [facility];  Service: Endoscopy;  Laterality: N/A;   COLONOSCOPY WITH PROPOFOL  N/A 04/04/2022   Procedure: COLONOSCOPY WITH PROPOFOL ;  Surgeon: Wilhelmenia Aloha Raddle., MD;  Location: [facility];  Service: Gastroenterology;  Laterality: N/A;   CORONARY ANGIOPLASTY     CORONARY STENT INTERVENTION N/A 02/11/2019   Procedure: CORONARY STENT INTERVENTION;  Surgeon: Verlin Lonni BIRCH, MD;  Location: [facility];  Service: Cardiovascular;  Laterality: N/A;  Prox RCA Prox LAD   ESOPHAGOGASTRODUODENOSCOPY (EGD) WITH PROPOFOL  N/A 04/09/2018   Procedure: ESOPHAGOGASTRODUODENOSCOPY (EGD) WITH PROPOFOL ;  Surgeon: Teressa Toribio SQUIBB, MD;  Location: [facility];  Service: Endoscopy;  Laterality: N/A;   ESOPHAGOGASTRODUODENOSCOPY (EGD) WITH PROPOFOL  N/A 04/04/2022   Procedure: ESOPHAGOGASTRODUODENOSCOPY (EGD) WITH PROPOFOL ;  Surgeon: Wilhelmenia Aloha Raddle., MD;  Location: [facility];  Service: Gastroenterology;  Laterality: N/A;   oopherectomy  OVARIAN CYST REMOVAL     POLYPECTOMY  04/09/2018   Procedure: POLYPECTOMY;  Surgeon: Teressa Toribio SQUIBB, MD;  Location: [facility];  Service: Endoscopy;;   POLYPECTOMY  04/04/2022   Procedure: POLYPECTOMY;  Surgeon: Wilhelmenia Aloha Raddle., MD;  Location: [facility];  Service: Gastroenterology;;   RIGHT/LEFT HEART CATH AND CORONARY ANGIOGRAPHY N/A  10/07/2018   Procedure: RIGHT/LEFT HEART CATH AND CORONARY ANGIOGRAPHY;  Surgeon: Verlin Lonni BIRCH, MD;  [facility];  Service: Cardiovascular;  Laterality: N/A;   TEE WITHOUT CARDIOVERSION N/A 04/20/2019   Procedure: TRANSESOPHAGEAL ECHOCARDIOGRAM (TEE);  Surgeon: Verlin Lonni BIRCH, MD;  Location: [facility];  Service: Open Heart Surgery;  Laterality: N/A;   TRANSCATHETER AORTIC VALVE REPLACEMENT, TRANSFEMORAL N/A 04/20/2019   Procedure: TRANSCATHETER AORTIC VALVE REPLACEMENT, TRANSFEMORAL approach using 23mm Evolut PRO+ medtronic valve;  Surgeon: Verlin Lonni BIRCH, MD;  Location: [facility];  Service: Open Heart Surgery;  Laterality: N/A;   Family History  Problem Relation Age of Onset   Alzheimer's disease Mother    Dementia Mother    Other Mother        twisted colon    Lung cancer Father    Heart disease Father        MI at age 65   Alcohol abuse Father    Stomach cancer Neg Hx    Colon cancer Neg Hx    Esophageal cancer Neg Hx    Pancreatic cancer Neg Hx    Inflammatory bowel disease Neg Hx    Liver disease Neg Hx    Rectal cancer Neg Hx    Social History   Occupational History   Not on file  Tobacco Use   Smoking status: Every Day    Current packs/day: 0.50    Types: Cigarettes   Smokeless tobacco: Never  Vaping Use   Vaping status: Never Used  Substance and Sexual Activity   Alcohol use: No   Drug use: Not Currently    Types: Benzodiazepines   Sexual activity: Not Currently   Tobacco Counseling Ready to quit: Not Answered Counseling given: Not Answered  SDOH Screenings   Food Insecurity: No Food Insecurity (08/12/2024)  Housing: Low Risk (08/12/2024)  Transportation Needs: No Transportation Needs (08/12/2024)  Utilities: Not At Risk (08/12/2024)  Alcohol Screen: Low Risk (07/24/2023)  Depression (PHQ2-9): High Risk (08/12/2024)  Financial Resource Strain: Low Risk (07/24/2023)  Physical Activity: Inactive (08/12/2024)  Social Connections:  Patient Declined (08/12/2024)  Stress: Stress Concern Present (08/12/2024)  Tobacco Use: High Risk (08/12/2024)  Health Literacy: Adequate Health Literacy (07/24/2023)   See flowsheets for full screening details  Depression Screen PHQ 2 & 9 Depression Scale- Over the past 2 weeks, how often have you been bothered by any of the following problems? Little interest or pleasure in doing things: 2 Feeling down, depressed, or hopeless (PHQ Adolescent also includes...irritable): 1 PHQ-2 Total Score: 3 Trouble falling or staying asleep, or sleeping too much: 3 Feeling tired or having little energy: 2 Poor appetite or overeating (PHQ Adolescent also includes...weight loss): 3 Feeling bad about yourself - or that you are a failure or have let yourself or your family down: 1 Trouble concentrating on things, such as reading the newspaper or watching television (PHQ Adolescent also includes...like school work): 0 Moving or speaking so slowly that other people could have noticed. Or the opposite - being so fidgety or restless that you have been moving around a lot more than usual: 0 Thoughts that you would be better off  dead, or of hurting yourself in some way: 0 PHQ-9 Total Score: 12  Depression Treatment Depression Interventions/Treatment : Counseling; Medication     Goals Addressed             This Visit's Progress    Have 3 meals a day   No change    Maintain healthy diet.             Objective:    Today's Vitals   08/12/24 1037  Weight: 152 lb (68.9 kg)  Height: 5' 4 (1.626 m)   Body mass index is 26.09 kg/m.  Hearing/Vision screen No results found. Immunizations and Health Maintenance Health Maintenance  Topic Date Due   OPHTHALMOLOGY EXAM  Never done   Diabetic kidney evaluation - Urine ACR  Never done   Zoster Vaccines- Shingrix (1 of 2) Never done   Bone Density Scan  Never done   Pneumococcal Vaccine: 50+ Years (2 of 2 - PCV) 04/20/2020   FOOT EXAM  01/21/2024    COVID-19 Vaccine (2 - 2025-26 season) 04/05/2024   Influenza Vaccine  11/02/2024 (Originally 03/05/2024)   HEMOGLOBIN A1C  10/10/2024   Diabetic kidney evaluation - eGFR measurement  06/23/2025   Medicare Annual Wellness (AWV)  08/12/2025   Colonoscopy  04/04/2032   Hepatitis C Screening  Completed   Meningococcal B Vaccine  Aged Out   DTaP/Tdap/Td  Discontinued   Mammogram  Discontinued        Assessment/Plan:  This is a routine wellness examination for Kelly [name].  Patient Care Team: Duanne Butler DASEN, MD as PCP - General (Family Medicine) Verlin Lonni BIRCH, MD as PCP - Cardiology (Cardiology) Mansouraty, Aloha Raddle., MD as Consulting Physician (Gastroenterology)  I have personally reviewed and noted the following in the patients chart:   Medical and social history Use of alcohol, tobacco or illicit drugs  Current medications and supplements including opioid prescriptions. Functional ability and status Nutritional status Physical activity Advanced directives List of other physicians Hospitalizations, surgeries, and ER visits in previous 12 months Vitals Screenings to include cognitive, depression, and falls Referrals and appointments  No orders of the defined types were placed in this encounter.  In addition, I have reviewed and discussed with patient certain preventive protocols, quality metrics, and best practice recommendations. A written personalized care plan for preventive services as well as general preventive health recommendations were provided to patient.   Kelly Charmaine Browner, LPN   03/05/7972   Return in 1 year (on 08/12/2025).  After Visit Summary: (Pick Up) Due to this being a telephonic visit, with patients personalized plan was offered to patient and patient has requested to Pick up at office.  Nurse Notes: Appointment(s) made: (08/16/24 to discuss back and leg pain) "

## 2024-08-16 ENCOUNTER — Encounter: Payer: Self-pay | Admitting: Family Medicine

## 2024-08-16 ENCOUNTER — Ambulatory Visit: Admitting: Family Medicine

## 2024-08-16 VITALS — BP 138/70 | HR 64 | Ht 64.0 in | Wt 148.8 lb

## 2024-08-16 DIAGNOSIS — S61411A Laceration without foreign body of right hand, initial encounter: Secondary | ICD-10-CM

## 2024-08-16 NOTE — Progress Notes (Signed)
 04/12/24 Patient is here today for follow-up.  She has a history of cirrhosis secondary to nonalcoholic steatohepatitis.  She has a history of upper GI bleeding secondary to AVMs as well as esophageal varices.  She has a history of aortic valve replacement due to severe aortic valve stenosis.  She has a history of type 2 diabetes, hypertension, hypothyroidism.  Patient also has a history of dementia.  At her last OV, my plan was: Patient appears euvolemic today.  She does not appear fluid overloaded.  I will resume spironolactone  25 mg daily due to her history of cirrhosis.  I will also resume nadolol  20 mg a day given her history of esophageal varices and because of her elevated blood pressure.  I plan to recheck her blood pressure in 2 to 3 weeks after she resumes these medications.  Obtain a baseline CMP today.  Recheck potassium and renal function in 2 weeks.  Given her history of bleeding AVMs, I will also check a CBC today.  Also check TSH and vitamin B12 given her severe fatigue.  I suspect this is likely due to her stopping levothyroxine .  Resume levothyroxine  88 mcg daily and check a baseline TSH today.  Schedule echocardiogram of the heart.  I will hold off adding additional medication until I see her back in 2 to 3 weeks.  Monitor her hemoglobin A1c to assess the control of her blood sugars  06/04/24 Labs were significant for abnormal TSH, Hgb of 10, and platelet count of 41.  Recommended she resume levothyroxine  88 mcg poqday in addition to nadolol .  Obtained echo which revealed ef of 65% and no evidence of heart failure. Went to ER yesterday.  Reviewed labs and CT.  Diagnosed with colitis and was given Augmentin .  However, today she states that she in only on the nadolol  and augmentin .  Not taking any other medication.  Wt Readings from Last 3 Encounters:  08/16/24 148 lb 12.8 oz (67.5 kg)  08/12/24 152 lb (68.9 kg)  06/23/24 152 lb (68.9 kg)  Patient was guaiac negative in the hospital.  She  went to the emergency room because she woke up and her sheets were stained with blood.  She was uncertain whether this was rectal bleeding or vaginal bleeding.  Patient's history is uncertain due to dementia.  She is very confused.  Her husband states that she has been reporting blood when she goes to the bathroom for quite some time.  However he is uncertain of whether it is coming from her stool or from her vagina.  I performed a speculum exam today.  With the patient in the lithotomy position, there was bright red blood around her rectum and on her gluteus.  There was no blood in the vagina.  I saw no blood in the posterior fornix.  Also saw no blood coming from the cervical os.  Therefore I believe it is clearly coming from her rectum. Patient was originally scheduled today for a physical exam however I am concerned by her level of confusion.  She never started back on the thyroid  medication.  She is still taking the nadolol  however she is clearly suffering from bright red blood per rectum.  She is currently on Augmentin  for colitis but she denies any abdominal pain.  Hemoglobin in the hospital yesterday was stable compared to her last office visit however she did appear to be dehydrated on her blood work.  She denies any heavy bleeding today.  She has seen a small  amount of blood on her underwear since leaving the hospital last night.  08/16/24 Patient had originally made the appointment for pain in her leg that starts around her left knee anteriorly and radiates down her anterior left shin and into her left ankle.  The pain is worse with standing.  However the visit was changed to focus on the laceration in her right hand.  Yesterday she suffered a 4 cm laceration on the palmar aspect of her right hand in the webspace between her thumb and her index finger.  She cut it with a knife trying to cut a tomato.  The subcutaneous tissue is exposed.  It is bleeding.  Past Medical History:  Diagnosis Date    Anxiety    Chronic upper GI bleeding    intestinal AVM's, portal gastropathy, esophageal varices on egd 04/2018   Coronary artery disease involving native coronary artery without angina pectoris    Dementia (HCC) 09/16/2016   Depression    DIVERTICULITIS OF COLON 06/14/2008   Qualifier: Diagnosis of  By: Tivis RN, Channing     DM (diabetes mellitus), type 2 (HCC) 09/15/2016   Esophageal varices determined by endoscopy (HCC)    Hyperlipidemia    Hypertension    Hypothyroidism    Incidental pulmonary nodule, greater than or equal to 8mm 10/14/2018   LLL nodule needs f/u CT or PET-CT scan   Irritable bowel syndrome 04/06/2008   Centricity Description: IBS Qualifier: Diagnosis of  By: Ezzard CMA (NANNIE), [name]   Centricity Description: IRRITABLE BOWEL SYNDROME Qualifier: Diagnosis of  By: Teressa MD, Toribio SQUIBB    Liver cirrhosis secondary to NASH (HCC) 09/16/2016   NASH (nonalcoholic steatohepatitis)    Obesity    Portal hypertensive gastropathy (HCC)    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 04/20/19: s/p TAVR wtih a 23mm Medtronic CoreValve Evolut Pro via the TF approach   Severe aortic stenosis    Splenomegaly 09/16/2016   Thrombocytopenia    Thyroid  disease    Tobacco abuse    Vitamin D deficiency    Past Surgical History:  Procedure Laterality Date   BIOPSY  04/04/2022   Procedure: BIOPSY;  Surgeon: Wilhelmenia Aloha Raddle., MD;  Location: [facility];  Service: Gastroenterology;;   CARDIAC CATHETERIZATION     COLONOSCOPY WITH PROPOFOL  N/A 04/09/2018   Procedure: COLONOSCOPY WITH PROPOFOL ;  Surgeon: Teressa Toribio SQUIBB, MD;  Location: [facility];  Service: Endoscopy;  Laterality: N/A;   COLONOSCOPY WITH PROPOFOL  N/A 04/04/2022   Procedure: COLONOSCOPY WITH PROPOFOL ;  Surgeon: Mansouraty, Aloha Raddle., MD;  Location: [facility];  Service: Gastroenterology;  Laterality: N/A;   CORONARY ANGIOPLASTY     CORONARY STENT INTERVENTION N/A 02/11/2019   Procedure: CORONARY STENT  INTERVENTION;  Surgeon: Verlin Lonni BIRCH, MD;  Location: [facility];  Service: Cardiovascular;  Laterality: N/A;  Prox RCA Prox LAD   ESOPHAGOGASTRODUODENOSCOPY (EGD) WITH PROPOFOL  N/A 04/09/2018   Procedure: ESOPHAGOGASTRODUODENOSCOPY (EGD) WITH PROPOFOL ;  Surgeon: Teressa Toribio SQUIBB, MD;  Location: [facility];  Service: Endoscopy;  Laterality: N/A;   ESOPHAGOGASTRODUODENOSCOPY (EGD) WITH PROPOFOL  N/A 04/04/2022   Procedure: ESOPHAGOGASTRODUODENOSCOPY (EGD) WITH PROPOFOL ;  Surgeon: Wilhelmenia Aloha Raddle., MD;  Location: [facility];  Service: Gastroenterology;  Laterality: N/A;   oopherectomy     OVARIAN CYST REMOVAL     POLYPECTOMY  04/09/2018   Procedure: POLYPECTOMY;  Surgeon: Teressa Toribio SQUIBB, MD;  Location: [facility];  Service: Endoscopy;;   POLYPECTOMY  04/04/2022   Procedure: POLYPECTOMY;  Surgeon:  Mansouraty, Aloha Raddle., MD;  Location: [facility];  Service: Gastroenterology;;   RIGHT/LEFT HEART CATH AND CORONARY ANGIOGRAPHY N/A 10/07/2018   Procedure: RIGHT/LEFT HEART CATH AND CORONARY ANGIOGRAPHY;  Surgeon: Verlin Lonni BIRCH, MD;  Location: [facility];  Service: Cardiovascular;  Laterality: N/A;   TEE WITHOUT CARDIOVERSION N/A 04/20/2019   Procedure: TRANSESOPHAGEAL ECHOCARDIOGRAM (TEE);  Surgeon: Verlin Lonni BIRCH, MD;  Location: [facility];  Service: Open Heart Surgery;  Laterality: N/A;   TRANSCATHETER AORTIC VALVE REPLACEMENT, TRANSFEMORAL N/A 04/20/2019   Procedure: TRANSCATHETER AORTIC VALVE REPLACEMENT, TRANSFEMORAL approach using 23mm Evolut PRO+ medtronic valve;  Surgeon: Verlin Lonni BIRCH, MD;  Location: [facility];  Service: Open Heart Surgery;  Laterality: N/A;   Current Outpatient Medications on File Prior to Visit  Medication Sig Dispense Refill   nadolol  (CORGARD ) 20 MG tablet Take 1 tablet (20 mg total) by mouth daily. 90 tablet 3   traZODone  (DESYREL ) 50 MG tablet TAKE 1 TABLET BY MOUTH EVERY DAY AT BEDTIME AS NEEDED 90 tablet 2    cyanocobalamin  (VITAMIN B12) 1000 MCG tablet Take 1 tablet (1,000 mcg total) by mouth daily. (Patient not taking: Reported on 08/12/2024) 30 tablet 3   FLUoxetine  (PROZAC ) 40 MG capsule Take 1 capsule (40 mg total) by mouth daily. Appointment needed w PCP. (Patient not taking: Reported on 08/12/2024) 90 capsule 0   furosemide  (LASIX ) 20 MG tablet Take 20 mg by mouth daily. (Patient not taking: Reported on 08/12/2024)     Iron , Ferrous Sulfate , 325 (65 Fe) MG TABS Take 325 mg by mouth daily. (Patient not taking: Reported on 08/12/2024) 30 tablet 3   levothyroxine  (SYNTHROID ) 88 MCG tablet Take 1 tablet (88 mcg total) by mouth daily. (Patient not taking: Reported on 08/12/2024) 90 tablet 3   losartan  (COZAAR ) 100 MG tablet TAKE 1 TABLET EVERY DAY (Patient not taking: Reported on 08/12/2024) 90 tablet 1   pantoprazole  (PROTONIX ) 40 MG tablet TAKE 1 TABLET EVERY DAY (Patient not taking: Reported on 08/12/2024) 90 tablet 3   potassium chloride  (KLOR-CON  M) 10 MEQ tablet Take 10 mEq by mouth 2 (two) times daily. (Patient not taking: Reported on 08/12/2024)     spironolactone  (ALDACTONE ) 25 MG tablet Take 1 tablet (25 mg total) by mouth daily. (Patient not taking: Reported on 08/12/2024) 90 tablet 3   No current facility-administered medications on file prior to visit.    Allergies  Allergen Reactions   Prednisone  Itching    Heart rate increased   Celecoxib Rash   Macrodantin [Nitrofurantoin Macrocrystal] Rash   Social History   Socioeconomic History   Marital status: Married    Spouse name: Not on file   Number of children: 1   Years of education: Not on file   Highest education level: Not on file  Occupational History   Not on file  Tobacco Use   Smoking status: Every Day    Current packs/day: 0.50    Types: Cigarettes   Smokeless tobacco: Never  Vaping Use   Vaping status: Never Used  Substance and Sexual Activity   Alcohol use: No   Drug use: Not Currently    Types: Benzodiazepines   Sexual  activity: Not Currently  Other Topics Concern   Not on file  Social History Narrative   Married x 53 years in 2023.   1 son   2 grandsons   Social Drivers of Health   Tobacco Use: High Risk (08/16/2024)   Patient History    Smoking Tobacco Use: Every  Day    Smokeless Tobacco Use: Never    Passive Exposure: Not on file  Financial Resource Strain: Low Risk (07/24/2023)   Overall Financial Resource Strain (CARDIA)    Difficulty of Paying Living Expenses: Not very hard  Food Insecurity: No Food Insecurity (08/12/2024)   Epic    Worried About Programme Researcher, Broadcasting/film/video in the Last Year: Never true    Ran Out of Food in the Last Year: Never true  Transportation Needs: No Transportation Needs (08/12/2024)   Epic    Lack of Transportation (Medical): No    Lack of Transportation (Non-Medical): No  Physical Activity: Inactive (08/12/2024)   Exercise Vital Sign    Days of Exercise per Week: 0 days    Minutes of Exercise per Session: 0 min  Stress: Stress Concern Present (08/12/2024)   [employer] of Occupational Health - Occupational Stress Questionnaire    Feeling of Stress: To some extent  Social Connections: Patient Declined (08/12/2024)   Social Connection and Isolation Panel    Frequency of Communication with Friends and Family: Patient declined    Frequency of Social Gatherings with Friends and Family: Patient declined    Attends Religious Services: Patient declined    Active Member of Clubs or Organizations: Patient declined    Attends Banker Meetings: Patient declined    Marital Status: Patient declined  Intimate Partner Violence: Not At Risk (07/24/2023)   Humiliation, Afraid, Rape, and Kick questionnaire    Fear of Current or Ex-Partner: No    Emotionally Abused: No    Physically Abused: No    Sexually Abused: No  Depression (PHQ2-9): Low Risk (08/16/2024)   Depression (PHQ2-9)    PHQ-2 Score: 0  Recent Concern: Depression (PHQ2-9) - High Risk (08/12/2024)    Depression (PHQ2-9)    PHQ-2 Score: 12  Alcohol Screen: Low Risk (07/24/2023)   Alcohol Screen    Last Alcohol Screening Score (AUDIT): 0  Housing: Low Risk (08/12/2024)   Epic    Unable to Pay for Housing in the Last Year: No    Number of Times Moved in the Last Year: 0    Homeless in the Last Year: No  Utilities: Not At Risk (08/12/2024)   Epic    Threatened with loss of utilities: No  Health Literacy: Adequate Health Literacy (07/24/2023)   B1300 Health Literacy    Frequency of need for help with medical instructions: Never       Review of Systems     Objective:   Physical Exam Exam conducted with a chaperone present.  Constitutional:      Appearance: Normal appearance. She is obese.  Cardiovascular:     Rate and Rhythm: Normal rate and regular rhythm.     Heart sounds: Murmur heard.  Pulmonary:     Effort: Pulmonary effort is normal. No respiratory distress.     Breath sounds: Normal breath sounds. No wheezing, rhonchi or rales.  Genitourinary:    Exam position: Lithotomy position.     Vagina: Normal. No erythema or bleeding.     Cervix: Normal. No cervical motion tenderness, erythema or cervical bleeding.     Rectum: External hemorrhoid present.   Musculoskeletal:     Right hand: Swelling, deformity, laceration and tenderness present. No bony tenderness. Normal strength. Normal sensation. There is no disruption of two-point discrimination. Normal capillary refill. Normal pulse.       Hands:  Neurological:     Mental Status: She is alert.  Assessment & Plan:  Laceration of right hand without foreign body, initial encounter Patient suffered a hand laceration.  Thankfully it is very superficial.  There is no evidence of any neurovascular damage.  There is no evidence of any torn or cut tendons.  Laceration is limited to the skin however there is bleeding due to her chronic thrombocytopenia.  Therefore I anesthetized the area with 0.1% lidocaine  with  epinephrine.  I then cleaned the area thoroughly with Betadine and prepped and draped the patient in sterile fashion.  I approximated the skin edges with 4, simple interrupted 2-0 Ethilon sutures.  The patient will return in 1 week for suture removal.  The hand was bandaged and wound care was discussed.  We will defer the discussion about her leg pain until next week where we can spend appropriate time.  We will also remove the sutures at that visit

## 2024-08-17 ENCOUNTER — Telehealth: Payer: Self-pay | Admitting: Gastroenterology

## 2024-08-17 NOTE — Telephone Encounter (Signed)
 Procedure:Colonoscopy/Endoscopy Procedure date: 08/26/24 Procedure location: [facility] Arrival Time: 12:00 pm Spoke with the patient Y/N: spoke with the patient's husband because the patient was in bed due to an injury Any prep concerns? No  Has the patient obtained the prep from the pharmacy ? Yes Do you have a care partner and transportation: Yes Any additional concerns? No

## 2024-08-18 ENCOUNTER — Ambulatory Visit

## 2024-08-18 DIAGNOSIS — S61411A Laceration without foreign body of right hand, initial encounter: Secondary | ICD-10-CM

## 2024-08-18 DIAGNOSIS — S61411D Laceration without foreign body of right hand, subsequent encounter: Secondary | ICD-10-CM

## 2024-08-18 NOTE — Progress Notes (Signed)
 Patient is in office today for a nurse visit for bandage change. Patient's bandage from sutures came loose. Pt asked that area be re-bandaged. Old bandage removed, new non-adherent gauze placed and covered with 2 coban. Pt tolerated well. Mjp,lpn

## 2024-08-23 ENCOUNTER — Encounter: Payer: Self-pay | Admitting: Family Medicine

## 2024-08-23 ENCOUNTER — Ambulatory Visit: Admitting: Family Medicine

## 2024-08-23 VITALS — BP 138/82 | HR 65 | Temp 97.9°F | Ht 64.0 in | Wt 147.0 lb

## 2024-08-23 DIAGNOSIS — M25562 Pain in left knee: Secondary | ICD-10-CM

## 2024-08-23 MED ORDER — DICLOFENAC SODIUM 1 % EX GEL: 2.0000 g | Freq: Four times a day (QID) | CUTANEOUS | 1 refills | Status: AC

## 2024-08-23 NOTE — Progress Notes (Signed)
 04/12/24 Patient is here today for follow-up.  She has a history of cirrhosis secondary to nonalcoholic steatohepatitis.  She has a history of upper GI bleeding secondary to AVMs as well as esophageal varices.  She has a history of aortic valve replacement due to severe aortic valve stenosis.  She has a history of type 2 diabetes, hypertension, hypothyroidism.  Patient also has a history of dementia.  At her last OV, my plan was: Patient appears euvolemic today.  She does not appear fluid overloaded.  I will resume spironolactone  25 mg daily due to her history of cirrhosis.  I will also resume nadolol  20 mg a day given her history of esophageal varices and because of her elevated blood pressure.  I plan to recheck her blood pressure in 2 to 3 weeks after she resumes these medications.  Obtain a baseline CMP today.  Recheck potassium and renal function in 2 weeks.  Given her history of bleeding AVMs, I will also check a CBC today.  Also check TSH and vitamin B12 given her severe fatigue.  I suspect this is likely due to her stopping levothyroxine .  Resume levothyroxine  88 mcg daily and check a baseline TSH today.  Schedule echocardiogram of the heart.  I will hold off adding additional medication until I see her back in 2 to 3 weeks.  Monitor her hemoglobin A1c to assess the control of her blood sugars  06/04/24 Labs were significant for abnormal TSH, Hgb of 10, and platelet count of 41.  Recommended she resume levothyroxine  88 mcg poqday in addition to nadolol .  Obtained echo which revealed ef of 65% and no evidence of heart failure. Went to ER yesterday.  Reviewed labs and CT.  Diagnosed with colitis and was given Augmentin .  However, today she states that she in only on the nadolol  and augmentin .  Not taking any other medication.  Wt Readings from Last 3 Encounters:  08/23/24 147 lb (66.7 kg)  08/16/24 148 lb 12.8 oz (67.5 kg)  08/12/24 152 lb (68.9 kg)  Patient was guaiac negative in the hospital.  She  went to the emergency room because she woke up and her sheets were stained with blood.  She was uncertain whether this was rectal bleeding or vaginal bleeding.  Patient's history is uncertain due to dementia.  She is very confused.  Her husband states that she has been reporting blood when she goes to the bathroom for quite some time.  However he is uncertain of whether it is coming from her stool or from her vagina.  I performed a speculum exam today.  With the patient in the lithotomy position, there was bright red blood around her rectum and on her gluteus.  There was no blood in the vagina.  I saw no blood in the posterior fornix.  Also saw no blood coming from the cervical os.  Therefore I believe it is clearly coming from her rectum. Patient was originally scheduled today for a physical exam however I am concerned by her level of confusion.  She never started back on the thyroid  medication.  She is still taking the nadolol  however she is clearly suffering from bright red blood per rectum.  She is currently on Augmentin  for colitis but she denies any abdominal pain.  Hemoglobin in the hospital yesterday was stable compared to her last office visit however she did appear to be dehydrated on her blood work.  She denies any heavy bleeding today.  She has seen a small  amount of blood on her underwear since leaving the hospital last night.  08/16/24 Patient had originally made the appointment for pain in her leg that starts around her left knee anteriorly and radiates down her anterior left shin and into her left ankle.  The pain is worse with standing.  However the visit was changed to focus on the laceration in her right hand.  Yesterday she suffered a 4 cm laceration on the palmar aspect of her right hand in the webspace between her thumb and her index finger.  She cut it with a knife trying to cut a tomato.  The subcutaneous tissue is exposed.  It is bleeding.  At that time, my plan was: Patient suffered a  hand laceration.  Thankfully it is very superficial.  There is no evidence of any neurovascular damage.  There is no evidence of any torn or cut tendons.  Laceration is limited to the skin however there is bleeding due to her chronic thrombocytopenia.  Therefore I anesthetized the area with 0.1% lidocaine  with epinephrine.  I then cleaned the area thoroughly with Betadine and prepped and draped the patient in sterile fashion.  I approximated the skin edges with 4, simple interrupted 2-0 Ethilon sutures.  The patient will return in 1 week for suture removal.  The hand was bandaged and wound care was discussed.  We will defer the discussion about her leg pain until next week where we can spend appropriate time.  We will also remove the sutures at that visit  08/23/26 Patient is here today for suture removal.  Four sutures were removed without difficulty.  The surrounding skin appears clean dry and intact with no evidence of cellulitis.  The patient denies any pain.  She has full range of motion in her hand last week she was complaining of knee pain.  The pain is primarily around the patella.  It does not hurt on a daily basis.  It does hurt with prolonged standing and walking.  Past Medical History:  Diagnosis Date   Anxiety    Chronic upper GI bleeding    intestinal AVM's, portal gastropathy, esophageal varices on egd 04/2018   Coronary artery disease involving native coronary artery without angina pectoris    Dementia (HCC) 09/16/2016   Depression    DIVERTICULITIS OF COLON 06/14/2008   Qualifier: Diagnosis of  By: Tivis RN, Channing     DM (diabetes mellitus), type 2 (HCC) 09/15/2016   Esophageal varices determined by endoscopy (HCC)    Hyperlipidemia    Hypertension    Hypothyroidism    Incidental pulmonary nodule, greater than or equal to 8mm 10/14/2018   LLL nodule needs f/u CT or PET-CT scan   Irritable bowel syndrome 04/06/2008   Centricity Description: IBS Qualifier: Diagnosis of  By: Ezzard CMA  (NANNIE), [name]   Centricity Description: IRRITABLE BOWEL SYNDROME Qualifier: Diagnosis of  By: Teressa MD, Toribio SQUIBB    Liver cirrhosis secondary to NASH (HCC) 09/16/2016   NASH (nonalcoholic steatohepatitis)    Obesity    Portal hypertensive gastropathy (HCC)    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 04/20/19: s/p TAVR wtih a 23mm Medtronic CoreValve Evolut Pro via the TF approach   Severe aortic stenosis    Splenomegaly 09/16/2016   Thrombocytopenia    Thyroid  disease    Tobacco abuse    Vitamin D deficiency    Past Surgical History:  Procedure Laterality Date   BIOPSY  04/04/2022   Procedure: BIOPSY;  Surgeon: Wilhelmenia Aloha Raddle., MD;  Location: [facility];  Service: Gastroenterology;;   CARDIAC CATHETERIZATION     COLONOSCOPY WITH PROPOFOL  N/A 04/09/2018   Procedure: COLONOSCOPY WITH PROPOFOL ;  Surgeon: Teressa Toribio SQUIBB, MD;  Location: [facility];  Service: Endoscopy;  Laterality: N/A;   COLONOSCOPY WITH PROPOFOL  N/A 04/04/2022   Procedure: COLONOSCOPY WITH PROPOFOL ;  Surgeon: Wilhelmenia Aloha Raddle., MD;  Location: [facility];  Service: Gastroenterology;  Laterality: N/A;   CORONARY ANGIOPLASTY     CORONARY STENT INTERVENTION N/A 02/11/2019   Procedure: CORONARY STENT INTERVENTION;  Surgeon: Verlin Lonni BIRCH, MD;  Location: [facility];  Service: Cardiovascular;  Laterality: N/A;  Prox RCA Prox LAD   ESOPHAGOGASTRODUODENOSCOPY (EGD) WITH PROPOFOL  N/A 04/09/2018   Procedure: ESOPHAGOGASTRODUODENOSCOPY (EGD) WITH PROPOFOL ;  Surgeon: Teressa Toribio SQUIBB, MD;  Location: [facility];  Service: Endoscopy;  Laterality: N/A;   ESOPHAGOGASTRODUODENOSCOPY (EGD) WITH PROPOFOL  N/A 04/04/2022   Procedure: ESOPHAGOGASTRODUODENOSCOPY (EGD) WITH PROPOFOL ;  Surgeon: Wilhelmenia Aloha Raddle., MD;  Location: [facility];  Service: Gastroenterology;  Laterality: N/A;   oopherectomy     OVARIAN CYST REMOVAL     POLYPECTOMY  04/09/2018   Procedure: POLYPECTOMY;   Surgeon: Teressa Toribio SQUIBB, MD;  Location: [facility];  Service: Endoscopy;;   POLYPECTOMY  04/04/2022   Procedure: POLYPECTOMY;  Surgeon: Wilhelmenia Aloha Raddle., MD;  Location: [facility];  Service: Gastroenterology;;   RIGHT/LEFT HEART CATH AND CORONARY ANGIOGRAPHY N/A 10/07/2018   Procedure: RIGHT/LEFT HEART CATH AND CORONARY ANGIOGRAPHY;  Surgeon: Verlin Lonni BIRCH, MD;  Location: [facility];  Service: Cardiovascular;  Laterality: N/A;   TEE WITHOUT CARDIOVERSION N/A 04/20/2019   Procedure: TRANSESOPHAGEAL ECHOCARDIOGRAM (TEE);  Surgeon: Verlin Lonni BIRCH, MD;  [facility];  Service: Open Heart Surgery;  Laterality: N/A;   TRANSCATHETER AORTIC VALVE REPLACEMENT, TRANSFEMORAL N/A 04/20/2019   Procedure: TRANSCATHETER AORTIC VALVE REPLACEMENT, TRANSFEMORAL approach using 23mm Evolut PRO+ medtronic valve;  Surgeon: Verlin Lonni BIRCH, MD;  Location: [facility];  Service: Open Heart Surgery;  Laterality: N/A;   Current Outpatient Medications on File Prior to Visit  Medication Sig Dispense Refill   nadolol  (CORGARD ) 20 MG tablet Take 1 tablet (20 mg total) by mouth daily. 90 tablet 3   traZODone  (DESYREL ) 50 MG tablet TAKE 1 TABLET BY MOUTH EVERY DAY AT BEDTIME AS NEEDED 90 tablet 2   cyanocobalamin  (VITAMIN B12) 1000 MCG tablet Take 1 tablet (1,000 mcg total) by mouth daily. (Patient not taking: Reported on 08/12/2024) 30 tablet 3   FLUoxetine  (PROZAC ) 40 MG capsule Take 1 capsule (40 mg total) by mouth daily. Appointment needed w PCP. (Patient not taking: Reported on 08/12/2024) 90 capsule 0   furosemide  (LASIX ) 20 MG tablet Take 20 mg by mouth daily. (Patient not taking: Reported on 08/12/2024)     Iron , Ferrous Sulfate , 325 (65 Fe) MG TABS Take 325 mg by mouth daily. (Patient not taking: Reported on 08/12/2024) 30 tablet 3   levothyroxine  (SYNTHROID ) 88 MCG tablet Take 1 tablet (88 mcg total) by mouth daily. (Patient not taking: Reported on 08/12/2024) 90 tablet 3   losartan   (COZAAR ) 100 MG tablet TAKE 1 TABLET EVERY DAY (Patient not taking: Reported on 08/12/2024) 90 tablet 1   pantoprazole  (PROTONIX ) 40 MG tablet TAKE 1 TABLET EVERY DAY (Patient not taking: Reported on 08/12/2024) 90 tablet 3   potassium chloride  (KLOR-CON  M) 10 MEQ tablet Take 10 mEq by mouth 2 (two) times daily. (Patient not taking: Reported on 08/12/2024)     spironolactone  (  ALDACTONE ) 25 MG tablet Take 1 tablet (25 mg total) by mouth daily. (Patient not taking: Reported on 08/12/2024) 90 tablet 3   No current facility-administered medications on file prior to visit.    Allergies  Allergen Reactions   Prednisone  Itching    Heart rate increased   Celecoxib Rash   Macrodantin [Nitrofurantoin Macrocrystal] Rash   Social History   Socioeconomic History   Marital status: Married    Spouse name: Not on file   Number of children: 1   Years of education: Not on file   Highest education level: Not on file  Occupational History   Not on file  Tobacco Use   Smoking status: Every Day    Current packs/day: 0.50    Types: Cigarettes   Smokeless tobacco: Never  Vaping Use   Vaping status: Never Used  Substance and Sexual Activity   Alcohol use: No   Drug use: Not Currently    Types: Benzodiazepines   Sexual activity: Not Currently  Other Topics Concern   Not on file  Social History Narrative   Married x 53 years in 2023.   1 son   2 grandsons   Social Drivers of Health   Tobacco Use: High Risk (08/23/2024)   Patient History    Smoking Tobacco Use: Every Day    Smokeless Tobacco Use: Never    Passive Exposure: Not on file  Financial Resource Strain: Low Risk (07/24/2023)   Overall Financial Resource Strain (CARDIA)    Difficulty of Paying Living Expenses: Not very hard  Food Insecurity: No Food Insecurity (08/12/2024)   Epic    Worried About Radiation Protection Practitioner of Food in the Last Year: Never true    Ran Out of Food in the Last Year: Never true  Transportation Needs: No Transportation Needs  (08/12/2024)   Epic    Lack of Transportation (Medical): No    Lack of Transportation (Non-Medical): No  Physical Activity: Inactive (08/12/2024)   Exercise Vital Sign    Days of Exercise per Week: 0 days    Minutes of Exercise per Session: 0 min  Stress: Stress Concern Present (08/12/2024)   [employer] of Occupational Health - Occupational Stress Questionnaire    Feeling of Stress: To some extent  Social Connections: Patient Declined (08/12/2024)   Social Connection and Isolation Panel    Frequency of Communication with Friends and Family: Patient declined    Frequency of Social Gatherings with Friends and Family: Patient declined    Attends Religious Services: Patient declined    Active Member of Clubs or Organizations: Patient declined    Attends Banker Meetings: Patient declined    Marital Status: Patient declined  Intimate Partner Violence: Not At Risk (07/24/2023)   Humiliation, Afraid, Rape, and Kick questionnaire    Fear of Current or Ex-Partner: No    Emotionally Abused: No    Physically Abused: No    Sexually Abused: No  Depression (PHQ2-9): Low Risk (08/16/2024)   Depression (PHQ2-9)    PHQ-2 Score: 0  Recent Concern: Depression (PHQ2-9) - High Risk (08/12/2024)   Depression (PHQ2-9)    PHQ-2 Score: 12  Alcohol Screen: Low Risk (07/24/2023)   Alcohol Screen    Last Alcohol Screening Score (AUDIT): 0  Housing: Low Risk (08/12/2024)   Epic    Unable to Pay for Housing in the Last Year: No    Number of Times Moved in the Last Year: 0    Homeless in the Last Year: No  Utilities: Not At Risk (08/12/2024)   Epic    Threatened with loss of utilities: No  Health Literacy: Adequate Health Literacy (07/24/2023)   B1300 Health Literacy    Frequency of need for help with medical instructions: Never       Review of Systems     Objective:   Physical Exam Exam conducted with a chaperone present.  Constitutional:      Appearance: Normal appearance. She is  obese.  Cardiovascular:     Rate and Rhythm: Normal rate and regular rhythm.     Heart sounds: Murmur heard.  Pulmonary:     Effort: Pulmonary effort is normal. No respiratory distress.     Breath sounds: Normal breath sounds. No wheezing, rhonchi or rales.  Genitourinary:    Exam position: Lithotomy position.     Vagina: Normal. No erythema or bleeding.     Cervix: Normal. No cervical motion tenderness, erythema or cervical bleeding.     Rectum: External hemorrhoid present.   Musculoskeletal:     Right hand: No tenderness or bony tenderness. Normal strength. Normal sensation. There is no disruption of two-point discrimination. Normal capillary refill. Normal pulse.       Hands:     Left knee: No swelling, deformity, effusion, erythema, ecchymosis or bony tenderness. Normal range of motion. Normal meniscus.  Neurological:     Mental Status: She is alert.          Assessment & Plan:  Acute pain of left knee I suspect this is due to mild osteoarthritis in the left knee.  Patient cannot take Tylenol  due to cirrhosis.  I do not want her taking NSAIDs due to esophageal varices and the risk of GI bleed.  The pain is not severe enough to warrant an intra-articular cortisone injection.  Therefore I recommended the patient use Voltaren  gel up to 4 g 4 times daily on the knee.  I removed the 4 sutures without difficulty and reinforced the wound with Steri-Strips

## 2024-08-25 ENCOUNTER — Telehealth: Payer: Self-pay | Admitting: Family Medicine

## 2024-08-25 NOTE — Telephone Encounter (Signed)
 Copied from CRM #[id number]. Topic: Clinical - Medication Question >> Aug 25, 2024  9:17 AM Mesmerise C wrote: Reason for CRM: Patient states she's having trouble sleeping and inquiring if he could send a medication in, states she had some things come up and if he had some time to speak with her can give her a call but would like it to be sent to [id number] [address], [place] - [address] [place] [id number]

## 2024-08-26 ENCOUNTER — Encounter (HOSPITAL_COMMUNITY): Admission: RE | Payer: Self-pay | Source: Home / Self Care

## 2024-08-26 ENCOUNTER — Ambulatory Visit (HOSPITAL_COMMUNITY): Admission: RE | Admit: 2024-08-26 | Admitting: Gastroenterology

## 2024-08-26 ENCOUNTER — Encounter (HOSPITAL_COMMUNITY): Payer: Self-pay | Admitting: Certified Registered"

## 2024-08-26 SURGERY — COLONOSCOPY
Anesthesia: Monitor Anesthesia Care

## 2024-08-26 NOTE — Progress Notes (Signed)
 Pt was a no show for procedure. I called and the patient said that she thought the procedure was yesterday and she said she called yesterday as she was having stomach issues. However no note in chart that she called yesterday. She is not coming today and was interested in being contacted to reschedule.

## 2024-08-26 NOTE — Anesthesia Preprocedure Evaluation (Signed)
"                                    Anesthesia Evaluation    Reviewed: Allergy & Precautions, Patient's Chart, lab work & pertinent test results, Unable to perform ROS - Chart review only  Airway Mallampati: II  TM Distance: >3 FB Neck ROM: Full    Dental  (+) Dental Advisory Given   Pulmonary Current Smoker   Pulmonary exam normal breath sounds clear to auscultation       Cardiovascular hypertension, Pt. on home beta blockers and Pt. on medications + CAD  Normal cardiovascular exam+ dysrhythmias + Valvular Problems/Murmurs AS  Rhythm:Regular Rate:Normal  Echo 01/2018 - Left ventricle: The cavity size was mildly dilated. Wall   thickness was increased in a pattern of mild LVH. Systolic function was normal. The estimated ejection fraction was in the range of 60% to 65%. Wall motion was normal; there were no  regional wall motion abnormalities. Doppler parameters are  consistent with abnormal left ventricular relaxation (grade 1 diastolic dysfunction). Doppler parameters are consistent with high ventricular filling pressure. - Aortic valve: Valve mobility was restricted. There was moderate to severe stenosis. There was no regurgitation. Peak velocity  (S): 400 cm/s. Mean gradient (S): 37 mm Hg. Peak gradient (S): 64   mm Hg. - Mitral valve: Transvalvular velocity was within the normal range.  There was no evidence for stenosis. There was no regurgitation. - Left atrium: The atrium was mildly dilated. - Right ventricle: The cavity size was normal. Wall thickness was  normal. Systolic function was normal. - Tricuspid valve: There was mild regurgitation. - Pulmonary arteries: Systolic pressure was within the normal  range. PA peak pressure: 31 mm Hg (S). - Pericardium, extracardiac: A trivial pericardial effusion was identified. - Global longitudinal strain -14.3% (abnormal).  Impressions:  - Compared with the echo 07/2017, the aortic stenosis  severity (moderate to severe)  is unchanged. An acending aorta aneurysm was not appreciated.   Neuro/Psych  PSYCHIATRIC DISORDERS Anxiety Depression   Dementia negative neurological ROS     GI/Hepatic negative GI ROS,,,(+) Hepatitis -  Endo/Other  diabetesHypothyroidism    Renal/GU negative Renal ROS     Musculoskeletal negative musculoskeletal ROS (+)    Abdominal   Peds  Hematology negative hematology ROS (+) Blood dyscrasia, anemia   Anesthesia Other Findings   Reproductive/Obstetrics negative OB ROS                              Anesthesia Physical Anesthesia Plan  ASA: IV  Anesthesia Plan: MAC   Post-op Pain Management:    Induction: Intravenous  PONV Risk Score and Plan: Ondansetron  and Propofol  infusion  Airway Management Planned:   Additional Equipment:   Intra-op Plan:   Post-operative Plan:   Informed Consent: I have reviewed the patients History and Physical, chart, labs and discussed the procedure including the risks, benefits and alternatives for the proposed anesthesia with the patient or authorized representative who has indicated his/her understanding and acceptance.     Dental advisory given  Plan Discussed with: CRNA  Anesthesia Plan Comments:          Anesthesia Quick Evaluation  "

## 2024-08-27 ENCOUNTER — Telehealth: Payer: Self-pay | Admitting: *Deleted

## 2024-08-27 NOTE — Telephone Encounter (Signed)
-----   Message ----- From: Wilhelmenia Aloha Raddle., MD Sent: 08/26/2024   4:25 PM EST To: Darice JONETTA Blacker, RN; [facility] Triage Subject: RE: Inpatient Notes                             This is unfortunate that the patient no-showed for her hospital-based EGD/colonoscopy. It is not documented that she actually did call the office. Before she is rescheduled for hospital-based procedures, she needs to be seen by one of the APP's. Thanks. GM       ----- Message ----- From: Blacker Darice JONETTA, RN Sent: 08/26/2024   1:46 PM EST To: Aloha Wilhelmenia Raddle., MD Subject: Inpatient Notes                                 See note about patient no show

## 2024-08-30 NOTE — Telephone Encounter (Signed)
 Contacted patient to schedule office visit with APP to further discuss rescheduling endo/colon at the hospital setting. Patient states that she has been having multiple problems with her stomach and just hasn't felt like she could go through with procedures. States Im not trying to put it off but just can't seem to get well enough to have anything. We discussed that unfortunately, the only way to tell what is causing her symptoms is to do the endo/colon procedure on her. She says I know, I know. States that she will call back at a later time to schedule an appointment in the office once she feels she can get well enough to have testing.
